# Patient Record
Sex: Female | Born: 1950 | Race: White | Hispanic: No | Marital: Married | Smoking: Former smoker
Health system: Southern US, Community
[De-identification: ages and names within clinical notes are randomized; demographics above are authoritative.]

## PROBLEM LIST (undated history)

## (undated) DIAGNOSIS — F419 Anxiety disorder, unspecified: Secondary | ICD-10-CM

## (undated) DIAGNOSIS — T7840XA Allergy, unspecified, initial encounter: Secondary | ICD-10-CM

## (undated) DIAGNOSIS — R32 Unspecified urinary incontinence: Secondary | ICD-10-CM

## (undated) DIAGNOSIS — F17200 Nicotine dependence, unspecified, uncomplicated: Secondary | ICD-10-CM

## (undated) DIAGNOSIS — I2510 Atherosclerotic heart disease of native coronary artery without angina pectoris: Secondary | ICD-10-CM

## (undated) DIAGNOSIS — I509 Heart failure, unspecified: Secondary | ICD-10-CM

## (undated) DIAGNOSIS — Q0700 Arnold-Chiari syndrome without spina bifida or hydrocephalus: Secondary | ICD-10-CM

## (undated) DIAGNOSIS — I10 Essential (primary) hypertension: Secondary | ICD-10-CM

## (undated) DIAGNOSIS — M1990 Unspecified osteoarthritis, unspecified site: Secondary | ICD-10-CM

## (undated) DIAGNOSIS — Z8674 Personal history of sudden cardiac arrest: Secondary | ICD-10-CM

## (undated) DIAGNOSIS — R0600 Dyspnea, unspecified: Secondary | ICD-10-CM

## (undated) DIAGNOSIS — R55 Syncope and collapse: Secondary | ICD-10-CM

## (undated) DIAGNOSIS — A048 Other specified bacterial intestinal infections: Secondary | ICD-10-CM

## (undated) DIAGNOSIS — Z95810 Presence of automatic (implantable) cardiac defibrillator: Secondary | ICD-10-CM

## (undated) DIAGNOSIS — K219 Gastro-esophageal reflux disease without esophagitis: Secondary | ICD-10-CM

## (undated) DIAGNOSIS — E785 Hyperlipidemia, unspecified: Secondary | ICD-10-CM

## (undated) HISTORY — PX: APPENDECTOMY: SHX54

## (undated) HISTORY — DX: Syncope and collapse: R55

## (undated) HISTORY — DX: Hyperlipidemia, unspecified: E78.5

## (undated) HISTORY — PX: TUBAL LIGATION: SHX77

## (undated) HISTORY — DX: Atherosclerotic heart disease of native coronary artery without angina pectoris: I25.10

## (undated) HISTORY — PX: EYE SURGERY: SHX253

## (undated) HISTORY — DX: Personal history of sudden cardiac arrest: Z86.74

## (undated) HISTORY — DX: Essential (primary) hypertension: I10

## (undated) HISTORY — PX: BLEPHAROPLASTY: SUR158

## (undated) HISTORY — PX: CARDIAC CATHETERIZATION: SHX172

## (undated) HISTORY — PX: CHOLECYSTECTOMY: SHX55

## (undated) HISTORY — DX: Arnold-Chiari syndrome without spina bifida or hydrocephalus: Q07.00

## (undated) HISTORY — DX: Anxiety disorder, unspecified: F41.9

## (undated) HISTORY — DX: Heart failure, unspecified: I50.9

## (undated) HISTORY — DX: Allergy, unspecified, initial encounter: T78.40XA

## (undated) HISTORY — DX: Presence of automatic (implantable) cardiac defibrillator: Z95.810

## (undated) HISTORY — PX: C-EYE SURGERY PROCEDURE: 102257504

## (undated) HISTORY — DX: Gastro-esophageal reflux disease without esophagitis: K21.9

## (undated) HISTORY — PX: CATARACT EXTRACTION: SUR2

## (undated) HISTORY — PX: OTHER SURGICAL HISTORY: SHX169

## (undated) HISTORY — DX: Nicotine dependence, unspecified, uncomplicated: F17.200

---

## 1998-08-15 ENCOUNTER — Other Ambulatory Visit: Admission: RE | Admit: 1998-08-15 | Discharge: 1998-08-15 | Payer: Self-pay | Admitting: Specialist

## 1999-01-28 ENCOUNTER — Ambulatory Visit (HOSPITAL_COMMUNITY): Admission: RE | Admit: 1999-01-28 | Discharge: 1999-01-28 | Payer: Self-pay | Admitting: Gastroenterology

## 1999-02-04 ENCOUNTER — Ambulatory Visit (HOSPITAL_COMMUNITY): Admission: RE | Admit: 1999-02-04 | Discharge: 1999-02-04 | Payer: Self-pay | Admitting: Gastroenterology

## 1999-02-04 ENCOUNTER — Encounter: Payer: Self-pay | Admitting: Gastroenterology

## 2000-02-12 ENCOUNTER — Other Ambulatory Visit: Admission: RE | Admit: 2000-02-12 | Discharge: 2000-02-12 | Payer: Self-pay | Admitting: Family Medicine

## 2001-01-19 ENCOUNTER — Other Ambulatory Visit: Admission: RE | Admit: 2001-01-19 | Discharge: 2001-01-19 | Payer: Self-pay | Admitting: *Deleted

## 2002-02-09 ENCOUNTER — Other Ambulatory Visit: Admission: RE | Admit: 2002-02-09 | Discharge: 2002-02-09 | Payer: Self-pay | Admitting: Internal Medicine

## 2002-04-19 ENCOUNTER — Encounter: Admission: RE | Admit: 2002-04-19 | Discharge: 2002-04-19 | Payer: Self-pay | Admitting: Family Medicine

## 2002-04-19 ENCOUNTER — Encounter: Payer: Self-pay | Admitting: Family Medicine

## 2004-01-02 ENCOUNTER — Encounter: Admission: RE | Admit: 2004-01-02 | Discharge: 2004-01-02 | Payer: Self-pay | Admitting: Family Medicine

## 2004-01-22 ENCOUNTER — Encounter (INDEPENDENT_AMBULATORY_CARE_PROVIDER_SITE_OTHER): Payer: Self-pay | Admitting: *Deleted

## 2004-01-22 ENCOUNTER — Observation Stay (HOSPITAL_COMMUNITY): Admission: RE | Admit: 2004-01-22 | Discharge: 2004-01-23 | Payer: Self-pay | Admitting: General Surgery

## 2004-02-20 ENCOUNTER — Encounter: Admission: RE | Admit: 2004-02-20 | Discharge: 2004-02-20 | Payer: Self-pay | Admitting: General Surgery

## 2004-04-16 ENCOUNTER — Ambulatory Visit: Payer: Self-pay | Admitting: Gastroenterology

## 2004-05-21 ENCOUNTER — Ambulatory Visit: Payer: Self-pay | Admitting: Gastroenterology

## 2004-08-01 ENCOUNTER — Ambulatory Visit: Payer: Self-pay | Admitting: Family Medicine

## 2004-08-28 ENCOUNTER — Ambulatory Visit: Payer: Self-pay | Admitting: Family Medicine

## 2004-09-09 ENCOUNTER — Encounter: Admission: RE | Admit: 2004-09-09 | Discharge: 2004-09-09 | Payer: Self-pay | Admitting: Family Medicine

## 2004-09-09 ENCOUNTER — Other Ambulatory Visit: Admission: RE | Admit: 2004-09-09 | Discharge: 2004-09-09 | Payer: Self-pay | Admitting: Family Medicine

## 2004-09-09 ENCOUNTER — Ambulatory Visit: Payer: Self-pay | Admitting: Family Medicine

## 2004-09-23 ENCOUNTER — Ambulatory Visit: Payer: Self-pay | Admitting: Family Medicine

## 2004-10-16 ENCOUNTER — Ambulatory Visit: Payer: Self-pay | Admitting: Family Medicine

## 2004-12-23 ENCOUNTER — Ambulatory Visit: Payer: Self-pay | Admitting: Family Medicine

## 2005-04-21 ENCOUNTER — Encounter
Admission: RE | Admit: 2005-04-21 | Discharge: 2005-04-21 | Payer: Self-pay | Admitting: Physical Medicine and Rehabilitation

## 2005-06-11 ENCOUNTER — Ambulatory Visit: Payer: Self-pay | Admitting: Family Medicine

## 2005-10-17 ENCOUNTER — Ambulatory Visit: Payer: Self-pay | Admitting: Internal Medicine

## 2006-02-17 ENCOUNTER — Ambulatory Visit: Payer: Self-pay | Admitting: Family Medicine

## 2006-03-11 ENCOUNTER — Ambulatory Visit: Payer: Self-pay | Admitting: Family Medicine

## 2006-08-11 ENCOUNTER — Ambulatory Visit: Payer: Self-pay | Admitting: Family Medicine

## 2006-12-08 ENCOUNTER — Ambulatory Visit: Payer: Self-pay | Admitting: Internal Medicine

## 2006-12-08 DIAGNOSIS — N3000 Acute cystitis without hematuria: Secondary | ICD-10-CM | POA: Insufficient documentation

## 2006-12-08 LAB — CONVERTED CEMR LAB
Bilirubin Urine: NEGATIVE
Glucose, Urine, Semiquant: NEGATIVE
Ketones, urine, test strip: NEGATIVE
Nitrite: NEGATIVE
Protein, U semiquant: NEGATIVE
Specific Gravity, Urine: 1.015
Urobilinogen, UA: 0.2
WBC Urine, dipstick: NEGATIVE
pH: 5

## 2007-02-25 ENCOUNTER — Encounter (INDEPENDENT_AMBULATORY_CARE_PROVIDER_SITE_OTHER): Payer: Self-pay | Admitting: Internal Medicine

## 2007-02-26 ENCOUNTER — Encounter (INDEPENDENT_AMBULATORY_CARE_PROVIDER_SITE_OTHER): Payer: Self-pay | Admitting: *Deleted

## 2007-03-01 ENCOUNTER — Ambulatory Visit: Payer: Self-pay | Admitting: Family Medicine

## 2007-03-01 LAB — CONVERTED CEMR LAB
Bilirubin Urine: NEGATIVE
Blood in Urine, dipstick: NEGATIVE
Glucose, Urine, Semiquant: NEGATIVE
Ketones, urine, test strip: NEGATIVE
Nitrite: NEGATIVE
Protein, U semiquant: NEGATIVE
Specific Gravity, Urine: 1.015
Urobilinogen, UA: NEGATIVE
WBC Urine, dipstick: NEGATIVE
pH: 6

## 2007-03-02 ENCOUNTER — Encounter: Payer: Self-pay | Admitting: Family Medicine

## 2007-05-03 ENCOUNTER — Ambulatory Visit: Payer: Self-pay | Admitting: Family Medicine

## 2007-05-03 DIAGNOSIS — R21 Rash and other nonspecific skin eruption: Secondary | ICD-10-CM | POA: Insufficient documentation

## 2007-05-03 DIAGNOSIS — J0101 Acute recurrent maxillary sinusitis: Secondary | ICD-10-CM | POA: Insufficient documentation

## 2007-05-03 DIAGNOSIS — R55 Syncope and collapse: Secondary | ICD-10-CM | POA: Insufficient documentation

## 2007-05-03 DIAGNOSIS — J019 Acute sinusitis, unspecified: Secondary | ICD-10-CM | POA: Insufficient documentation

## 2007-05-03 DIAGNOSIS — J0100 Acute maxillary sinusitis, unspecified: Secondary | ICD-10-CM | POA: Insufficient documentation

## 2007-10-12 ENCOUNTER — Ambulatory Visit: Payer: Self-pay | Admitting: Family Medicine

## 2007-10-12 DIAGNOSIS — J069 Acute upper respiratory infection, unspecified: Secondary | ICD-10-CM | POA: Insufficient documentation

## 2007-10-14 ENCOUNTER — Telehealth (INDEPENDENT_AMBULATORY_CARE_PROVIDER_SITE_OTHER): Payer: Self-pay | Admitting: Internal Medicine

## 2008-05-30 ENCOUNTER — Telehealth (INDEPENDENT_AMBULATORY_CARE_PROVIDER_SITE_OTHER): Payer: Self-pay | Admitting: Internal Medicine

## 2008-06-12 ENCOUNTER — Encounter (INDEPENDENT_AMBULATORY_CARE_PROVIDER_SITE_OTHER): Payer: Self-pay | Admitting: Internal Medicine

## 2008-06-20 ENCOUNTER — Ambulatory Visit (HOSPITAL_BASED_OUTPATIENT_CLINIC_OR_DEPARTMENT_OTHER): Admission: RE | Admit: 2008-06-20 | Discharge: 2008-06-20 | Payer: Self-pay | Admitting: Orthopedic Surgery

## 2008-08-02 ENCOUNTER — Encounter (INDEPENDENT_AMBULATORY_CARE_PROVIDER_SITE_OTHER): Payer: Self-pay | Admitting: Internal Medicine

## 2008-08-08 ENCOUNTER — Encounter (INDEPENDENT_AMBULATORY_CARE_PROVIDER_SITE_OTHER): Payer: Self-pay | Admitting: Internal Medicine

## 2008-08-08 ENCOUNTER — Encounter (INDEPENDENT_AMBULATORY_CARE_PROVIDER_SITE_OTHER): Payer: Self-pay | Admitting: *Deleted

## 2008-08-08 LAB — HM MAMMOGRAPHY: HM Mammogram: NORMAL

## 2008-12-19 ENCOUNTER — Ambulatory Visit: Payer: Self-pay | Admitting: Family Medicine

## 2008-12-19 DIAGNOSIS — Z87891 Personal history of nicotine dependence: Secondary | ICD-10-CM | POA: Insufficient documentation

## 2008-12-19 DIAGNOSIS — F17200 Nicotine dependence, unspecified, uncomplicated: Secondary | ICD-10-CM

## 2008-12-19 DIAGNOSIS — I10 Essential (primary) hypertension: Secondary | ICD-10-CM | POA: Insufficient documentation

## 2009-03-19 ENCOUNTER — Telehealth: Payer: Self-pay | Admitting: Family Medicine

## 2009-04-02 ENCOUNTER — Encounter: Admission: RE | Admit: 2009-04-02 | Discharge: 2009-04-02 | Payer: Self-pay | Admitting: Internal Medicine

## 2009-09-19 ENCOUNTER — Encounter (INDEPENDENT_AMBULATORY_CARE_PROVIDER_SITE_OTHER): Payer: Self-pay | Admitting: *Deleted

## 2010-07-16 NOTE — Miscellaneous (Signed)
Summary: MGM Results   Clinical Lists Changes  Observations: Added new observation of MAMMO DUE: 08/2009 (08/08/2008 15:31) Added new observation of MAMMOGRAM: normal (08/08/2008 15:31)      Preventive Care Screening  Mammogram:    Date:  08/08/2008    Next Due:  08/2009    Results:  normal

## 2010-07-16 NOTE — Assessment & Plan Note (Signed)
Summary: 2:00 UTI  Medications Added LOTREL 5-20 MG CAPS (AMLODIPINE BESY-BENAZEPRIL HCL) Take 1 capsule by mouth once a day PROTONIX 40 MG TBEC (PANTOPRAZOLE SODIUM) Take 1 tablet by mouth once a day CIPRO 250 MG  TABS (CIPROFLOXACIN HCL) 1 two times a day for 3 days for bladder infection. Continue for 1 week if symptoms not relieved in the firtst day or two of treatment      Allergies Added: * SULFA * DOXYCYCLINE * PREDNISONE * TETRACYCLINE * CODEINE * GUAIFENESIN * PEPCID  Vital Signs:  Patient Profile:   60 Years Old Female Weight:      163.38 pounds Temp:     98.5 degrees F oral Pulse rate:   72 / minute BP sitting:   132 / 74  (left arm)  Vitals Entered By: Lelon Mast (December 08, 2006 1:56 PM)               Chief Complaint:  / uti.  History of Present Illness: Got ?cold 2 weeks ago--caught from grandson Tried claritin and guafenisin Still not over it  Cold meds generally affect her urinary system Now with R back since 6/20 Chills, feels lousy Nocturia which is not typical for her + burning dysuria and urgency No fever but had some sweats 6/22 Slight nausea, no abd pain Able to eat  Current Allergies: * SULFA * DOXYCYCLINE * PREDNISONE * TETRACYCLINE * CODEINE * GUAIFENESIN * PEPCID      Physical Exam  General:     NAD Ears:     R ear normal and L ear normal.   Nose:     mild congestion Mouth:     no erythema and no exudates.   Lungs:     normal respiratory effort and normal breath sounds.   Abdomen:     mild suprapubic tenderness Msk:     mild R CVA tenderness    Impression & Recommendations:  Problem # 1:  CYSTITIS, ACUTE (ICD-595.0) Assessment: New Discussed that she should avoid cold meds No evidence of UTI though symptoms are classic for cystitis Ongoing URI that seems viral Her updated medication list for this problem includes:    Cipro 250 Mg Tabs (Ciprofloxacin hcl) .[name]... 1 two times a day for 3 days for bladder  infection. continue for 1 week if symptoms not relieved in the firtst day or two of treatment  Orders: UA Dipstick w/o Micro (81002)   Medications Added to Medication List This Visit: 1)  Lotrel 5-20 Mg Caps (Amlodipine besy-benazepril hcl) .... Take 1 capsule by mouth once a day 2)  Protonix 40 Mg Tbec (Pantoprazole sodium) .... Take 1 tablet by mouth once a day 3)  Cipro 250 Mg Tabs (Ciprofloxacin hcl) .[name].. 1 two times a day for 3 days for bladder infection. continue for 1 week if symptoms not relieved in the firtst day or two of treatment   Patient Instructions: 1)  Please schedule a follow-up appointment as needed.    Prescriptions: CIPRO 250 MG  TABS (CIPROFLOXACIN HCL) 1 two times a day for 3 days for bladder infection. Continue for 1 week if symptoms not relieved in the firtst day or two of treatment  #14 x 0   Entered and Authorized by:   Claris Gower MD   Signed by:   Claris Gower MD on 12/08/2006   Method used:   Print then Give to Patient   RxID:   [id number]    Vital Signs:  Patient Profile:  60 Years Old Female Weight:      163.38 pounds Temp:     98.5 degrees F oral Pulse rate:   72 / minute BP sitting:   132 / 74               Laboratory Results   Urine Tests  Date/Time Recieved: 6.24.2008  Routine Urinalysis   Color: yellow Appearance: Clear Glucose: negative   (Normal Range: Negative) Bilirubin: negative   (Normal Range: Negative) Ketone: negative   (Normal Range: Negative) Spec. Gravity: 1.015   (Normal Range: 1.003-1.035) Blood: small   (Normal Range: Negative) pH: 5.0   (Normal Range: 5.0-8.0) Protein: negative   (Normal Range: Negative) Urobilinogen: 0.2   (Normal Range: 0-1) Nitrite: negative   (Normal Range: Negative) Leukocyte Esterace: negative   (Normal Range: Negative)

## 2010-07-16 NOTE — Letter (Signed)
Summary: Results Follow up Letter  [place] [facility]  [address] [place], [place] [address]   Phone: [phone]  Fax: [phone]    02/26/2007 MRN: [id number]  [name] [address] [place], [place]  [address]  Dear Ms. Kartes,  The following are the results of your recent test(s):  Test         Result    Pap Smear:        Normal _____  Not Normal _____ Comments: ______________________________________________________ Cholesterol: LDL(Bad cholesterol):         Your goal is less than:         HDL (Good cholesterol):       Your goal is more than: Comments:  ______________________________________________________ Mammogram:        Normal __x___  Not Normal _____ Comments:  Repeat in one year please.  ___________________________________________________________________ Hemoccult:        Normal _____  Not normal _______ Comments:    _____________________________________________________________________ Other Tests:    We routinely do not discuss normal results over the telephone.  If you desire a copy of the results, or you have any questions about this information we can discuss them at your next office visit.   Sincerely,    BillieBean,FNP/[place]. [place],CMA

## 2010-07-16 NOTE — Assessment & Plan Note (Signed)
Summary: CONGESTION/CLE   Vital Signs:  Patient profile:   60 year old female Height:      64.5 inches Weight:      164.50 pounds BMI:     27.90 Temp:     98.3 degrees F oral Pulse rate:   68 / minute Pulse rhythm:   regular Resp:     20 per minute BP sitting:   132 / 80  (left arm) Cuff size:   regular  Vitals Entered By: Ozzie Hoyle LPN (July  6, 624THL 624THL AM)  CC:  congestion in chest and head, productive cough  thick but colorless mucus, and both ears ache and sorethroat on and off with drainage.[name]  History of Present Illness: Here for URI--cough and head congestion, ears hurt--onset x 3wks ago--fever and chills, no work for 4 days --has been taking guiafenesin and tylenol --got better, then got worseagain-- --continues to smoke 1 pk/d--is aware of need to stop  Clarinex not more help than claritin  Allergies: 1)  ! * Latex 2)  * Sulfa 3)  * Doxycycline 4)  * Prednisone 5)  * Tetracycline 6)  * Codeine 7)  * Guaifenesin 8)  * Pepcid  Past History:  Review of Systems      See HPI  Physical Exam  General:  alert, well-developed, well-nourished, and well-hydrated.   Eyes:  pupils equal, pupils round, and no injection.   Ears:  TMs retracted with fluid bilat Nose:  no airflow obstruction, mucosal erythema, and mucosal edema.  sinuses tender throughout Mouth:  no exudates and pharyngeal erythema.   Neck:  no JVD and no carotid bruits.   Lungs:  moist harsh cough, no crackles and no wheezes.   Heart:  normal rate, regular rhythm, and no murmur.   Extremities:  no pretib or ankle edema Neurologic:  alert & oriented X3 and gait normal.   Cervical Nodes:  shotty tonsilar nodes--tender, no anterior cervical adenopathy and no posterior cervical adenopathy.   Psych:  normally interactive and good eye contact.     Impression & Recommendations:  Problem # 1:  URI (ICD-465.9) Assessment New continue comfort care measures: increase po fluids, rest, tylenol or IBP as  needed   biaxin 2 once daily for 7d claritin as needed  Her updated medication list for this problem includes:    Clarinex 5 Mg Tabs (Desloratadine) .[name]... 1 once daily for congestion as needed    Tylenol 325 Mg Tabs (Acetaminophen) ..... Otc as directed  Problem # 2:  HYPERTENSION (ICD-401.9) Assessment: Unchanged will continue on current med see back in 6 mo or as needed  Her updated medication list for this problem includes:    Lotrel 5-20 Mg Caps (Amlodipine besy-benazepril hcl) .[name]... Take 1 capsule by mouth once a day  BP today: 132/80 Prior BP: 118/78 (10/12/2007)  Problem # 3:  TOBACCO ABUSE (ICD-305.1) Assessment: Unchanged strongly encouraged reduction in number/d with goal of D/C  Complete Medication List: 1)  Lotrel 5-20 Mg Caps (Amlodipine besy-benazepril hcl) .... Take 1 capsule by mouth once a day 2)  Protonix 40 Mg Tbec (Pantoprazole sodium) .... Take 1 tablet by mouth once a day as needed 3)  Xanax 0.25 Mg Tabs (Alprazolam) .... As needed 4)  Clarinex 5 Mg Tabs (Desloratadine) .[name].. 1 once daily for congestion as needed 5)  Tylenol 325 Mg Tabs (Acetaminophen) .... Otc as directed 6)  Biaxin 500 Mg Tabs (Clarithromycin) .... Take 2 daily Prescriptions: LOTREL 5-20 MG CAPS (AMLODIPINE BESY-BENAZEPRIL  HCL) Take 1 capsule by mouth once a day  #30 x 6   Entered and Authorized by:   Dixie Dials FNP   Signed by:   Dixie Dials FNP on 12/19/2008   Method used:   Print then Give to Patient   RxID:   [id number] BIAXIN 500 MG TABS (CLARITHROMYCIN) take 2 daily  #20 x 0   Entered and Authorized by:   Dixie Dials FNP   Signed by:   Dixie Dials FNP on 12/19/2008   Method used:   Print then Give to Patient   [id number]   Current Allergies (reviewed today): ! * LATEX * SULFA * DOXYCYCLINE * PREDNISONE * TETRACYCLINE * CODEINE * GUAIFENESIN * PEPCID

## 2010-07-16 NOTE — Progress Notes (Signed)
Summary: Rx-Alprazolam  Phone Note Refill Request Message from:  Fax from Pharmacy on May 30, 2008 8:26 AM  Refills Requested: Medication #1:  XANAX 0.25 MG  TABS as needed   Last Refilled: 11/16/2007 take 1/2 to 1 tablet daily as needed. #[address] [place] [id number]  Initial call taken by: Jasmine December,  May 30, 2008 8:27 AM  Follow-up for Phone Call        Rx completed  Billie-Lynn Fabian Sharp FNP  May 30, 2008 11:54 AM   Additional Follow-up for Phone Call Additional follow up Details #1::        Rx called to pharmacy as instructed. Additional Follow-up by: Emelia Salisbury,  May 30, 2008 12:33 PM      Prescriptions: XANAX 0.25 MG  TABS (ALPRAZOLAM) as needed  #30 x 5   Entered and Authorized by:   Dixie Dials FNP   Signed by:   Dixie Dials FNP on 05/30/2008   Method used:   Telephoned to ...       [facility]  [place] (retail)       [address]       [place], [place]  [address]       Ph: [phone] or [phone]       Fax: [phone]   RxID:   [id number]

## 2010-07-16 NOTE — Letter (Signed)
Summary: Previsit letter  [facility]  [place], [place] [address]   Phone: [phone]  Fax: [phone]       09/19/2009 MRN: [id number]  [name] [address] [place],   [address]  Dear [name],  Welcome to the Gastroenterology Division at [facility].    You are scheduled to see a nurse for your pre-procedure visit on 10-19-09 at 8:30a.m. on the 3rd floor at [facility] [place] [employer].  We ask that you try to arrive at our office 15 minutes prior to your appointment time to allow for check-in.  Your nurse visit will consist of discussing your medical and surgical history, your immediate family medical history, and your medications.    Please bring a complete list of all your medications or, if you prefer, bring the medication bottles and we will list them.  We will need to be aware of both prescribed and over the counter drugs.  We will need to know exact dosage information as well.  If you are on blood thinners (Coumadin, Plavix, Aggrenox, Ticlid, etc.) please call our office today/prior to your appointment, as we need to consult with your physician about holding your medication.   Please be prepared to read and sign documents such as consent forms, a financial agreement, and acknowledgement forms.  If necessary, and with your consent, a friend or relative is welcome to sit-in on the nurse visit with you.  Please bring your insurance card so that we may make a copy of it.  If your insurance requires a referral to see a specialist, please bring your referral form from your primary care physician.  No co-pay is required for this nurse visit.     If you cannot keep your appointment, please call [phone] to cancel or reschedule prior to your appointment date.  This allows [place] the opportunity to schedule an appointment for another patient in need of care.    Thank you for choosing [facility] for your medical  needs.  We appreciate the opportunity to care for you.  Please visit [place] at our website  to learn more about our practice.                     Sincerely.                                                                                                                   The Gastroenterology Division

## 2010-07-16 NOTE — Miscellaneous (Signed)
  Clinical Lists Changes  Observations: Added new observation of MAMMOGRAM: normal (02/08/2007 15:02)       Preventive Care Screening  Mammogram:    Date:  02/08/2007    Results:  normal

## 2010-07-16 NOTE — Assessment & Plan Note (Signed)
Summary: ?UTI/CLE   Vital Signs:  Patient Profile:   60 Years Old Female Weight:      163.25 pounds Temp:     98.4 degrees F oral Pulse rate:   72 / minute Pulse rhythm:   regular BP sitting:   142 / 90  (left arm) Cuff size:   regular  Vitals Entered ByMarland Kitchen Christena Deem (March 01, 2007 12:15 PM)                 Chief Complaint:  ? UTI.  Acute Visit History:      The patient complains of genitourinary symptoms.  She denies fever.  She notes a history of dysuria and frequency.  She denies fever, flank pain, or hematuria.  Comments: hx of frequent UTI symptoms went away after Cipro x 3 tabs in 6/08, UA was neg then. Did not take full course. Had some leftover Cipro took 3 tabs 1 week ago, symptoms improved and then recurred. low back ache and central anterior abdominal pressure```````````````````````````````.         Current Allergies (reviewed today): * SULFA * DOXYCYCLINE * PREDNISONE * TETRACYCLINE * CODEINE * GUAIFENESIN * PEPCID   Family History:    Reviewed history and no changes required:     Physical Exam  General:     Well-developed,well-nourished,in no acute distress; alert,appropriate and cooperative throughout examination Abdomen:     mild suprapubic tenderness, ttp left adenexa, no mass    Impression & Recommendations:  Problem # 1:  CYSTITIS, ACUTE (ICD-595.0) Again negative UA but classic UTI symptoms.  ? partitally treated.  Will  send culture to eval for bac/sensitivities.  In meantime will treat with Cipro full uncomplicated UTI course.    Her updated medication list for this problem includes:    Cipro 250 Mg Tabs (Ciprofloxacin hcl) .[name]... Take 1 tablet by mouth every 12 hours x 3 days  Orders: T-Culture, Urine [id number]) UA Dipstick w/o Micro [id number])  The following medications were removed from the medication list:    Cipro 250 Mg Tabs (Ciprofloxacin hcl) .[name]... 1 two times a day for 3 days for bladder infection. continue for 1  week if symptoms not relieved in the firtst day or two of treatment  Her updated medication list for this problem includes:    Cipro 250 Mg Tabs (Ciprofloxacin hcl) .[name]... Take 1 tablet by mouth every 12 hours x 3 days   Complete Medication List: 1)  Lotrel 5-20 Mg Caps (Amlodipine besy-benazepril hcl) .... Take 1 capsule by mouth once a day 2)  Protonix 40 Mg Tbec (Pantoprazole sodium) .... Take 1 tablet by mouth once a day as needed 3)  Xanax 0.25 Mg Tabs (Alprazolam) .... As needed 4)  Estrace 0.1 Mg/gm Crea (Estradiol) .... As needed 5)  Tylenol #8  .... Once daily as needed 6)  Cipro 250 Mg Tabs (Ciprofloxacin hcl) .... Take 1 tablet by mouth every 12 hours x 3 days     Prescriptions: CIPRO 250 MG  TABS (CIPROFLOXACIN HCL) Take 1 tablet by mouth every 12 hours x 3 days  #6 x 0   Entered and Authorized by:   [name] MD   Signed by:   [name] MD on 03/01/2007   Method used:   Print then Give to Patient   RxID:   [id number]  ] Laboratory Results   Urine Tests  Date/Time Recieved: March 01, 2007 12:31 PM  Date/Time Reported: March 01, 2007 12:31 PM   Routine Urinalysis  Color: yellow Appearance: Clear Glucose: negative   (Normal Range: Negative) Bilirubin: negative   (Normal Range: Negative) Ketone: negative   (Normal Range: Negative) Spec. Gravity: 1.015   (Normal Range: 1.003-1.035) Blood: negative   (Normal Range: Negative) pH: 6.0   (Normal Range: 5.0-8.0) Protein: negative   (Normal Range: Negative) Urobilinogen: negative   (Normal Range: 0-1) Nitrite: negative   (Normal Range: Negative) Leukocyte Esterace: negative   (Normal Range: Negative)        Current Allergies (reviewed today): * SULFA * DOXYCYCLINE * PREDNISONE * TETRACYCLINE * CODEINE * GUAIFENESIN * PEPCID

## 2010-10-29 NOTE — Op Note (Signed)
NAME:  [name], [name]                ACCOUNT NO.:  [id number]   MEDICAL RECORD NO.:  [id number]          PATIENT TYPE:  AMB   LOCATION:  [place]                          FACILITY:  [facility]   PHYSICIAN:  Youlanda Mighty. Sypher, M.D. 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   OPERATION:  1. Excision of mucoid cyst.  2. Distal interphalangeal arthrotomy right long finger with removal of      marginal osteophytes, loose body, and synovectomy.   OPERATING SURGEON:  Youlanda Mighty. Sypher, MD   ASSISTANT:  Marily Lente Dasnoit, PA-C   ANESTHESIA:  Lidocaine 2% metacarpal head, right long finger block.  This was performed as a minor operating room setting procedure.   ANESTHETIST:  Youlanda Mighty. Sypher, MD   INDICATIONS:  [name] is a 60 year old woman who was seen for  evaluation and management of a draining mucoid cyst.  She was referred  by Wyatt Mage of [employer], [place].   X-rays of her finger demonstrated advanced osteoarthritis with bone-on-  bone arthropathy and large marginal osteophytes with a probable loose  body.   After informed consent, she was brought to the operating room at this  time anticipating cyst debridement and PIP joint debridement.   PROCEDURE:  [name] is brought to the operating room and placed in  supine position upon the table.  Following informed consent, a 2%  lidocaine metacarpal head level block was placed at the base of the  right long finger.   After 10 minutes, excellent anesthesia was  achieved.   The right arm was prepped with Betadine soap solution and sterilely  draped.  Latex free protocol was followed due to her stated latex  allergy.   The finger was exsanguinated with a latex free Esmarch that was left as  a digital tourniquet.  The cyst was excised with a circumferential  dissection down through the dermis to the peritenon.  This was passed  off as an en bloc resection.   The ulnar aspect of the long finger DIP joint was then entered with  removal of the capsule between the terminal extensor tendon slip and the  ulnar collateral ligament.  A small curette was used to remove the loose  bodies followed by use of a microrongeur to perform a synovectomy.  The  marginal osteophytes at the base of the distal phalanx and the middle  phalanx were removed with a rongeur and careful use of a microcurette.   Thereafter the wound was repaired with simple interrupted suture of 5-0  nylon.  There were no apparent complications.  Ms. Steadman was placed in  a compressive dressing with Xeroflo sterile gauze and latex free Coban.  She was provided prescription for Darvocet-N 100 one p.o. q.4-6 h.  p.r.n. pain, 20 tablets without refill as a postoperative analgesic.  We  will see her back for followup in 1 week for suture removal.   She expressed an interest in returning to work in 66 hours.  We advised  her to remove her dressing and change Band-Aids with Neosporin on the  wound on a once a day or twice a day basis depending on swelling.   She will contact our office p.r.n. wound problems.      Youlanda Mighty Sypher, M.D.  Electronically Signed     RVS/MEDQ  D:  06/20/2008  T:  06/21/2008  Job:  [id number]

## 2010-11-01 NOTE — Op Note (Signed)
NAME:  [name], [name]                          ACCOUNT NO.:  [id number]   MEDICAL RECORD NO.:  [id number]                   PATIENT TYPE:  AMB   LOCATION:  DAY                                  FACILITY[facility]  [facility]   PHYSICIAN:  Timothy E. Rosana Hoes, M.D.              DATE OF BIRTH:  [birth year]   DATE OF PROCEDURE:  01/22/2004  DATE OF DISCHARGE:                                 OPERATIVE REPORT   PREOPERATIVE DIAGNOSIS:  Chronic cholecystolithiasis.   POSTOPERATIVE DIAGNOSIS:  Chronic cholecystolithiasis.   PROCEDURE:  Laparoscopic cholecystectomy and operative cholangiogram.   SURGEON:  Lennie Hummer, M.D.   ASSISTANT:  RN   This otherwise healthy 60 year old Caucasian female presented in the office  for chronic cholecystolithiasis, symptomatic, and she wishes to proceed with  a laparoscopic cholecystectomy and has been carefully discussed.  Note today  is made of her LATEX allergy.   She is taken to the operating room, placed supine, general endotracheal  anesthesia administered.  The abdomen was prepped and draped in the usual  fashion.  Marcaine 0.25% with epinephrine is used prior to each incision.  An infraumbilical incision was made, the fascia identified, opened in the  midline, peritoneum entered without complication, Hasson catheter placed and  tied in place with a figure-of-eight suture 0 Vicryl.  The abdomen  insufflated.  General peritoneoscopy was unremarkable.  A second 10 mm  trocar placed in the mid epigastrium, two 5 mm trocars in the right upper  quadrant.  The gallbladder was indolent.  It was grasped, placed on tension.  Careful dissection at the base of the gallbladder with completely dissection  of a window showed the isolated cystic duct and an isolated cystic artery.  The cystic duct was clipped near the gallbladder.  Small incision made in  the cystic duct and with the percutaneously passed cholangiogram catheter  and then using real-time fluoroscopy,  cholangiography showed filling of the  biliary tree with smooth and rapid emptying into the duodenum.  The clip was  removed, the catheter removed, the stump of the cystic duct triply clipped,  the duct fully divided, the artery isolated, triply clipped, and divided,  and the gallbladder removed from the gallbladder bed without incident or  complication.  The bed was dry, and irrigant was clear.  Gallbladder was  removed through the infraumbilical incision which was then tied under direct  vision.  Further irrigation was clear.  All irrigant, CO2 instruments, and  trocars removed under direct vision.  Counts were correct.  Each incision  was carefully checked, and the wounds were closed with 3-0 Monocryl  subcuticular sutures.  Steri-Strips and dry sterile dressing applied.  Final  counts correct.  She tolerated it well, was awakened, and taken to the  recovery room in good condition.  Timothy E. Rosana Hoes, M.D.    TED/MEDQ  D:  01/22/2004  T:  01/22/2004  Job:  [id number]   cc:   Marne A. Glori Bickers, M.D. [facility]

## 2011-01-03 ENCOUNTER — Encounter: Payer: Self-pay | Admitting: Family Medicine

## 2011-01-03 ENCOUNTER — Ambulatory Visit (INDEPENDENT_AMBULATORY_CARE_PROVIDER_SITE_OTHER): Payer: PRIVATE HEALTH INSURANCE | Admitting: Family Medicine

## 2011-01-03 VITALS — BP 148/80 | HR 80 | Temp 98.6°F | Wt 162.0 lb

## 2011-01-03 DIAGNOSIS — N3000 Acute cystitis without hematuria: Secondary | ICD-10-CM

## 2011-01-03 DIAGNOSIS — R300 Dysuria: Secondary | ICD-10-CM

## 2011-01-03 DIAGNOSIS — R350 Frequency of micturition: Secondary | ICD-10-CM

## 2011-01-03 LAB — POCT URINALYSIS DIPSTICK
Bilirubin, UA: NEGATIVE
Blood, UA: NEGATIVE
Glucose, UA: NEGATIVE
Ketones, UA: NEGATIVE
Leukocytes, UA: NEGATIVE
Nitrite, UA: NEGATIVE
Protein, UA: NEGATIVE
Spec Grav, UA: 1.005
Urobilinogen, UA: 0.2
pH, UA: 6.5

## 2011-01-03 MED ORDER — CIPROFLOXACIN HCL 250 MG PO TABS: 250.0000 mg | ORAL_TABLET | Freq: Two times a day (BID) | ORAL | Status: DC

## 2011-01-03 NOTE — Assessment & Plan Note (Signed)
UA normal. However given typical UTI sxs pt endorses, will treat with 3d course, sent culture. Advised if not improving as expected to return to be seen.

## 2011-01-03 NOTE — Patient Instructions (Signed)
Urine looking ok today, however will send culture and treat with 3 d course of cipro 250mg  twice daily given typical symptoms. We will call you with urine results. In meantime, rest, fluids and tylenol for discomfort.

## 2011-01-03 NOTE — Progress Notes (Signed)
  Subjective:    Patient ID: [name], female    DOB: 11/19/1950, 60 y.o.   MRN: [id number]  HPI CC: UTI?  1 wk h/o bladder sxs.  At first thought had hurt back (lower back pain).  + dysuria and polyuria, urgency.  Some flank pain.  Some abd soreness.  No n/v/d, fevers/chills.  No blood in urine.  Has been drinking plenty of fluid.  Taken tylenol for discomfort.  States recently went swimming - used to get UTIs when went swimming years ago.  States urine normally looks fine, but has had uti in past.  Review of Systems Per HPI    Objective:   Physical Exam  Nursing note and vitals reviewed. Constitutional: She appears well-developed and well-nourished. No distress.  HENT:  Head: Normocephalic and atraumatic.  Mouth/Throat: Oropharynx is clear and moist. No oropharyngeal exudate.  Eyes: Conjunctivae and EOM are normal. Pupils are equal, round, and reactive to light. No scleral icterus.  Cardiovascular: Normal rate, regular rhythm, normal heart sounds and intact distal pulses.   No murmur heard. Pulmonary/Chest: Effort normal and breath sounds normal. No respiratory distress. She has no wheezes. She has no rales.  Abdominal: Soft. Bowel sounds are normal. She exhibits no distension. There is no tenderness. There is no rebound and no guarding.       No CVA tenderness  Skin: Skin is warm and dry. No rash noted.  Psychiatric: She has a normal mood and affect.          Assessment & Plan:

## 2011-01-05 LAB — URINE CULTURE
Colony Count: NO GROWTH
Organism ID, Bacteria: NO GROWTH

## 2011-01-08 ENCOUNTER — Ambulatory Visit (INDEPENDENT_AMBULATORY_CARE_PROVIDER_SITE_OTHER): Payer: PRIVATE HEALTH INSURANCE | Admitting: Family Medicine

## 2011-01-08 ENCOUNTER — Other Ambulatory Visit (HOSPITAL_COMMUNITY)
Admission: RE | Admit: 2011-01-08 | Discharge: 2011-01-08 | Disposition: A | Discharge status: Home / Self Care | Payer: PRIVATE HEALTH INSURANCE | Source: Ambulatory Visit | Attending: Family Medicine | Admitting: Family Medicine

## 2011-01-08 ENCOUNTER — Encounter: Payer: Self-pay | Admitting: Family Medicine

## 2011-01-08 VITALS — BP 130/80 | HR 67 | Temp 98.7°F | Wt 160.5 lb

## 2011-01-08 DIAGNOSIS — Z01419 Encounter for gynecological examination (general) (routine) without abnormal findings: Secondary | ICD-10-CM | POA: Insufficient documentation

## 2011-01-08 DIAGNOSIS — F320 Major depressive disorder, single episode, mild: Secondary | ICD-10-CM | POA: Insufficient documentation

## 2011-01-08 DIAGNOSIS — Z1159 Encounter for screening for other viral diseases: Secondary | ICD-10-CM | POA: Insufficient documentation

## 2011-01-08 DIAGNOSIS — Z113 Encounter for screening for infections with a predominantly sexual mode of transmission: Secondary | ICD-10-CM

## 2011-01-08 DIAGNOSIS — F4320 Adjustment disorder, unspecified: Secondary | ICD-10-CM

## 2011-01-08 DIAGNOSIS — R3129 Other microscopic hematuria: Secondary | ICD-10-CM

## 2011-01-08 LAB — POCT URINALYSIS DIPSTICK
Bilirubin, UA: NEGATIVE
Glucose, UA: NEGATIVE
Ketones, UA: NEGATIVE
Leukocytes, UA: NEGATIVE
Nitrite, UA: NEGATIVE
Spec Grav, UA: 1.005
Urobilinogen, UA: NEGATIVE
pH, UA: 6

## 2011-01-08 LAB — HIV ANTIBODY (ROUTINE TESTING W REFLEX): HIV: NONREACTIVE

## 2011-01-08 NOTE — Patient Instructions (Signed)
Hang in there. Please stop by to see [name] on your way out.

## 2011-01-08 NOTE — Progress Notes (Signed)
  Subjective:    Patient ID: [name], female    DOB: Aug 31, 1950, 60 y.o.   MRN: [id number]  HPI 60 yo here for ? STD. Saw Dr. Darnell Level 5 days ago with presumed UTI symptoms.  Given 3 day course of cipro but urine culture showed no growth and symptoms resolved.  Says she is truly here to discuss personal issues:  Her husband is manic and has been for several months, not willing to take his antipsychotic medication or see a psychiatrist. She is concerned he has given her an STD. During one of his manic episodes in the 90s, he gave her chlamydia. Denies any vagina discharge, dysuria or suprapubic pressure. No genital sores.  Wants her pap smear done today as well.  Very anxious about this situation, seeing Delia Chimes but wants to see a psychiatrist. Cannot tolerate SSRIs.  Takes as needed Xanax but lately she is tearful and anxious all the time. No SI or HI.   Review of Systems Per HPI    Objective:   Physical Exam  BP 130/80  Pulse 67  Temp(Src) 98.7 F (37.1 C) (Oral)  Wt 160 lb 8 oz (72.802 kg)  General:  Well-developed,well-nourished,in no acute distress; alert,appropriate and cooperative throughout examination Head:  normocephalic and atraumatic.   Eyes:  vision grossly intact, pupils equal, pupils round, and pupils reactive to light.   Ears:  R ear normal and L ear normal.   Nose:  no external deformity.   Mouth:  good dentition.   Abdomen:  Bowel sounds positive,abdomen soft and non-tender without masses, organomegaly or hernias noted. Rectal:  no external abnormalities.   Genitalia:  Pelvic Exam:        External: normal female genitalia without lesions or masses        Vagina: normal without lesions or masses        Cervix: normal without lesions or masses        Adnexa: normal bimanual exam without masses or fullness        Uterus: normal by palpation        Pap smear: performed Msk:  No deformity or scoliosis noted of thoracic or lumbar spine.   Extremities:   No clubbing, cyanosis, edema, or deformity noted with normal full range of motion of all joints.   Neurologic:  alert & oriented X3 and gait normal.   Skin:  Intact without suspicious lesions or rashes Cervical Nodes:  No lymphadenopathy noted Axillary Nodes:  No palpable lymphadenopathy Psych:  Anxious, tearful and difficulty focusing on one topic         Assessment & Plan:   1. Screening for STD (sexually transmitted disease)  Orders Placed This Encounter  Procedures  . HIV Antibody ( Reflex)  . RPR  . GC/chlamydia probe amp, genital     3. Adjustment disorder   Will refer to psychiatry as I do think she would benefit from psychotherapy and further evaluation/medication management given her intolerance or SSRIs. Marland Kitchenagreed   4. Hematuria, microscopic   New.  Send urine for culture. Repeat UA in 2 weeks, if hematuria persists, refer to urology.

## 2011-01-09 LAB — RPR

## 2011-01-09 LAB — GC/CHLAMYDIA PROBE AMP, GENITAL
Chlamydia, DNA Probe: NEGATIVE
GC Probe Amp, Genital: NEGATIVE

## 2011-01-10 LAB — URINE CULTURE
Colony Count: NO GROWTH
Organism ID, Bacteria: NO GROWTH

## 2011-01-12 ENCOUNTER — Emergency Department (HOSPITAL_COMMUNITY)
Admission: EM | Admit: 2011-01-12 | Discharge: 2011-01-12 | Disposition: A | Discharge status: Home / Self Care | Payer: PRIVATE HEALTH INSURANCE | Attending: Emergency Medicine | Admitting: Emergency Medicine

## 2011-01-12 ENCOUNTER — Inpatient Hospital Stay (INDEPENDENT_AMBULATORY_CARE_PROVIDER_SITE_OTHER)
Admission: RE | Admit: 2011-01-12 | Discharge: 2011-01-12 | Disposition: A | Discharge status: Home / Self Care | Payer: PRIVATE HEALTH INSURANCE | Source: Ambulatory Visit | Attending: Family Medicine | Admitting: Family Medicine

## 2011-01-12 DIAGNOSIS — R10814 Left lower quadrant abdominal tenderness: Secondary | ICD-10-CM

## 2011-01-12 DIAGNOSIS — N390 Urinary tract infection, site not specified: Secondary | ICD-10-CM | POA: Insufficient documentation

## 2011-01-12 DIAGNOSIS — M545 Low back pain, unspecified: Secondary | ICD-10-CM | POA: Insufficient documentation

## 2011-01-12 DIAGNOSIS — I10 Essential (primary) hypertension: Secondary | ICD-10-CM | POA: Insufficient documentation

## 2011-01-12 LAB — POCT URINALYSIS DIP (DEVICE)
Bilirubin Urine: NEGATIVE
Glucose, UA: NEGATIVE mg/dL
Ketones, ur: NEGATIVE mg/dL
Leukocytes, UA: NEGATIVE
Nitrite: NEGATIVE
Protein, ur: NEGATIVE mg/dL
Specific Gravity, Urine: <=1.005 (ref 1.005–1.030)
Urobilinogen, UA: 0.2 mg/dL (ref 0.0–1.0)
pH: 5.5 (ref 5.0–8.0)

## 2011-01-12 LAB — URINALYSIS, ROUTINE W REFLEX MICROSCOPIC
Bilirubin Urine: NEGATIVE
Glucose, UA: NEGATIVE mg/dL
Hgb urine dipstick: NEGATIVE
Ketones, ur: NEGATIVE mg/dL
Leukocytes, UA: NEGATIVE
Nitrite: NEGATIVE
Protein, ur: NEGATIVE mg/dL
Specific Gravity, Urine: 1.012 (ref 1.005–1.030)
Urobilinogen, UA: 0.2 mg/dL (ref 0.0–1.0)
pH: 6.5 (ref 5.0–8.0)

## 2011-01-13 ENCOUNTER — Encounter: Payer: Self-pay | Admitting: *Deleted

## 2011-07-01 ENCOUNTER — Ambulatory Visit (INDEPENDENT_AMBULATORY_CARE_PROVIDER_SITE_OTHER): Payer: PRIVATE HEALTH INSURANCE | Admitting: *Deleted

## 2011-07-01 DIAGNOSIS — Z23 Encounter for immunization: Secondary | ICD-10-CM

## 2011-11-05 ENCOUNTER — Other Ambulatory Visit: Payer: Self-pay | Admitting: Family Medicine

## 2011-11-05 DIAGNOSIS — Z0000 Encounter for general adult medical examination without abnormal findings: Secondary | ICD-10-CM

## 2011-11-05 DIAGNOSIS — Z136 Encounter for screening for cardiovascular disorders: Secondary | ICD-10-CM

## 2011-11-05 DIAGNOSIS — I10 Essential (primary) hypertension: Secondary | ICD-10-CM

## 2011-11-11 ENCOUNTER — Other Ambulatory Visit: Payer: PRIVATE HEALTH INSURANCE

## 2011-11-12 ENCOUNTER — Other Ambulatory Visit (INDEPENDENT_AMBULATORY_CARE_PROVIDER_SITE_OTHER): Payer: PRIVATE HEALTH INSURANCE

## 2011-11-12 DIAGNOSIS — I10 Essential (primary) hypertension: Secondary | ICD-10-CM

## 2011-11-12 DIAGNOSIS — Z0000 Encounter for general adult medical examination without abnormal findings: Secondary | ICD-10-CM

## 2011-11-12 DIAGNOSIS — Z136 Encounter for screening for cardiovascular disorders: Secondary | ICD-10-CM

## 2011-11-12 LAB — CBC WITH DIFFERENTIAL/PLATELET
Basophils Absolute: 0 10*3/uL (ref 0.0–0.1)
Basophils Relative: 0.8 % (ref 0.0–3.0)
Eosinophils Absolute: 0.1 10*3/uL (ref 0.0–0.7)
Eosinophils Relative: 2 % (ref 0.0–5.0)
HCT: 43.1 % (ref 36.0–46.0)
Hemoglobin: 14.3 g/dL (ref 12.0–15.0)
Lymphocytes Relative: 27.1 % (ref 12.0–46.0)
Lymphs Abs: 1.7 10*3/uL (ref 0.7–4.0)
MCHC: 33.2 g/dL (ref 30.0–36.0)
MCV: 90.6 fl (ref 78.0–100.0)
Monocytes Absolute: 0.2 10*3/uL (ref 0.1–1.0)
Monocytes Relative: 3.8 % (ref 3.0–12.0)
Neutro Abs: 4.2 10*3/uL (ref 1.4–7.7)
Neutrophils Relative %: 66.3 % (ref 43.0–77.0)
Platelets: 234 10*3/uL (ref 150.0–400.0)
RBC: 4.75 Mil/uL (ref 3.87–5.11)
RDW: 13.7 % (ref 11.5–14.6)
WBC: 6.3 10*3/uL (ref 4.5–10.5)

## 2011-11-12 LAB — COMPREHENSIVE METABOLIC PANEL
ALT: 9 U/L (ref 0–35)
AST: 16 U/L (ref 0–37)
Albumin: 4.1 g/dL (ref 3.5–5.2)
Alkaline Phosphatase: 69 U/L (ref 39–117)
BUN: 7 mg/dL (ref 6–23)
CO2: 29 mEq/L (ref 19–32)
Calcium: 9 mg/dL (ref 8.4–10.5)
Chloride: 105 mEq/L (ref 96–112)
Creatinine, Ser: 0.9 mg/dL (ref 0.4–1.2)
GFR: 66.72 mL/min (ref >60.00)
Glucose, Bld: 93 mg/dL (ref 70–99)
Potassium: 3.5 mEq/L (ref 3.5–5.1)
Sodium: 141 mEq/L (ref 135–145)
Total Bilirubin: 0.5 mg/dL (ref 0.3–1.2)
Total Protein: 6.9 g/dL (ref 6.0–8.3)

## 2011-11-12 LAB — LIPID PANEL
Cholesterol: 238 mg/dL — ABNORMAL HIGH (ref 0–200)
HDL: 39.7 mg/dL (ref >39.00)
Total CHOL/HDL Ratio: 6
Triglycerides: 125 mg/dL (ref 0.0–149.0)
VLDL: 25 mg/dL (ref 0.0–40.0)

## 2011-11-12 LAB — LDL CHOLESTEROL, DIRECT: Direct LDL: 191.5 mg/dL

## 2011-11-19 ENCOUNTER — Ambulatory Visit (INDEPENDENT_AMBULATORY_CARE_PROVIDER_SITE_OTHER): Payer: PRIVATE HEALTH INSURANCE | Admitting: Family Medicine

## 2011-11-19 ENCOUNTER — Encounter: Payer: Self-pay | Admitting: Family Medicine

## 2011-11-19 VITALS — BP 150/70 | HR 84 | Temp 98.0°F | Wt 146.0 lb

## 2011-11-19 DIAGNOSIS — Z0000 Encounter for general adult medical examination without abnormal findings: Secondary | ICD-10-CM

## 2011-11-19 DIAGNOSIS — Z1211 Encounter for screening for malignant neoplasm of colon: Secondary | ICD-10-CM

## 2011-11-19 DIAGNOSIS — I10 Essential (primary) hypertension: Secondary | ICD-10-CM

## 2011-11-19 DIAGNOSIS — N951 Menopausal and female climacteric states: Secondary | ICD-10-CM | POA: Insufficient documentation

## 2011-11-19 DIAGNOSIS — E785 Hyperlipidemia, unspecified: Secondary | ICD-10-CM | POA: Insufficient documentation

## 2011-11-19 MED ORDER — CONJ ESTROG-MEDROXYPROGEST ACE 0.3-1.5 MG PO TABS: 1.0000 | ORAL_TABLET | Freq: Every day | ORAL | Status: DC

## 2011-11-19 MED ORDER — AMLODIPINE BESY-BENAZEPRIL HCL 5-20 MG PO CAPS: 1.0000 | ORAL_CAPSULE | Freq: Every day | ORAL | Status: DC

## 2011-11-19 MED ORDER — ALPRAZOLAM 0.25 MG PO TABS: 0.2500 mg | ORAL_TABLET | Freq: Three times a day (TID) | ORAL | Status: DC | PRN

## 2011-11-19 NOTE — Patient Instructions (Addendum)
Please come back in 2 weeks to get your blood pressure rechecked. Please stop by the lab to get your stool cards.  Check with your insurance to see if they will cover the shingles shot.

## 2011-11-19 NOTE — Progress Notes (Signed)
Subjective:    Patient ID: [name], female    DOB: 03/31/1951, 60 y.o.   MRN: [id number]  HPI 61 yo here for CPX.  Had mammogram in November, pap smear last year. Has never had a colonoscopy and continues to refuse one--she will however agree to stool cards.  Had a rough year- husband had a psychotic break, now on appropriate mediations.    Very anxious about this situation, seeing Delia Chimes and another psychologist at [facility].  Also saw Dr. Jacques Earthly (psychiatry) at [facility]. Cannot tolerate SSRIs.  Takes as needed Xanax and feels it has been working ok. No SI or HI.  Vaginal dryness- would like to try low dose HRT again.  Having some mild hot flashes.  Post menopausal for 10 years now.    HLD- has been on multiple statins, cannot tolerate any of them due to myalgias, including pravastatin per pt. Allergic to fish oil and cannot tolerate niacin. Has been working on diet, lost a significant amount of weight since last year. Wt Readings from Last 3 Encounters:  11/19/11 146 lb (66.225 kg)  01/08/11 160 lb 8 oz (72.802 kg)  01/03/11 162 lb (73.483 kg)   HTN- had been stable on Lotrel, ran out yesterday. No HA, CP or SOB.  It's very expensive and would like to try generic equivalent.  Lab Results  Component Value Date   CHOL 238* 11/12/2011   HDL 39.70 11/12/2011   LDLDIRECT 191.5 11/12/2011   TRIG 125.0 11/12/2011   CHOLHDL 6 11/12/2011     Patient Active Problem List  Diagnoses  . TOBACCO ABUSE  . HYPERTENSION  . CYSTITIS, ACUTE  . SYNCOPE  . RASH-NONVESICULAR  . Screening for STD (sexually transmitted disease)  . Adjustment disorder  . Hematuria, microscopic  . Routine general medical examination at a health care facility  . HLD (hyperlipidemia)   Past Medical History  Diagnosis Date  . Unspecified essential hypertension   . Syncope and collapse   . Tobacco use disorder    No past surgical history on file. History  Substance Use Topics  . Smoking  status: Current Everyday Smoker -- 1.0 packs/day    Types: Cigarettes  . Smokeless tobacco: Not on file  . Alcohol Use: No   No family history on file. Allergies  Allergen Reactions  . Codeine     REACTION: unspecified  . Doxycycline     REACTION: unspecified  . Famotidine     REACTION: rash  . Guaifenesin     REACTION: palpitations  . Latex     REACTION: causes rash  . Prednisone     REACTION: anxiety  . Sulfonamide Derivatives     REACTION: unspecified  . Tetracycline     REACTION: nausea   Current Outpatient Prescriptions on File Prior to Visit  Medication Sig Dispense Refill  . acetaminophen (TYLENOL) 325 MG tablet Take 650 mg by mouth every 6 (six) hours as needed.        . ALPRAZolam (XANAX) 0.25 MG tablet Take 0.25 mg by mouth as needed.        [name] amLODipine-benazepril (LOTREL) 5-20 MG per capsule Take 1 capsule by mouth daily.        [name] loratadine (CLARITIN) 10 MG tablet Take 10 mg by mouth daily as needed.          Review of Systems Per HPI Patient reports no  vision/ hearing changes,anorexia, weight change, fever ,adenopathy, persistant / recurrent hoarseness, swallowing issues, chest pain, edema,persistant /  recurrent cough, hemoptysis, dyspnea(rest, exertional, paroxysmal nocturnal), gastrointestinal  bleeding (melena, rectal bleeding), abdominal pain, excessive heart burn, GU symptoms(dysuria, hematuria, pyuria, voiding/incontinence  Issues) syncope, focal weakness, severe memory loss, concerning skin lesions, depression, anxiety, abnormal bruising/bleeding, major joint swelling, breast masses or abnormal vaginal bleeding.       Objective:   Physical Exam  BP 150/70  Pulse 84  Temp(Src) 98 F (36.7 C) (Oral)  Wt 146 lb (66.225 kg) Wt Readings from Last 3 Encounters:  11/19/11 146 lb (66.225 kg)  01/08/11 160 lb 8 oz (72.802 kg)  01/03/11 162 lb (73.483 kg)    General:  Well-developed,well-nourished,in no acute distress; alert,appropriate and cooperative  throughout examination Head:  normocephalic and atraumatic.   Eyes:  vision grossly intact, pupils equal, pupils round, and pupils reactive to light.   Ears:  R ear normal and L ear normal.   Nose:  no external deformity.   Mouth:  good dentition.   Neck:  No deformities, masses, or tenderness noted. Breasts:  No mass, nodules, thickening, tenderness, bulging, retraction, inflamation, nipple discharge or skin changes noted.   Lungs:  Normal respiratory effort, chest expands symmetrically. Lungs are clear to auscultation, no crackles or wheezes. Heart:  Normal rate and regular rhythm. S1 and S2 normal without gallop, murmur, click, rub or other extra sounds. Abdomen:  Bowel sounds positive,abdomen soft and non-tender without masses, organomegaly or hernias noted. Msk:  No deformity or scoliosis noted of thoracic or lumbar spine.   Extremities:  No clubbing, cyanosis, edema, or deformity noted with normal full range of motion of all joints.   Neurologic:  alert & oriented X3 and gait normal.   Skin:  Intact without suspicious lesions or rashes Cervical Nodes:  No lymphadenopathy noted Axillary Nodes:  No palpable lymphadenopathy Psych:  Cognition and judgment appear intact. Alert and cooperative with normal attention span and concentration. No apparent delusions, illusions, hallucinations Psych:  Less anxious today, still has difficulty focusing on one topic     Assessment & Plan:    1. Routine general medical examination at a health care facility  Reviewed preventive care protocols, scheduled due services, and updated immunizations Discussed nutrition, exercise, diet, and healthy lifestyle. She will call her insurance company about coverage of Zostvax.  Fecal occult blood, imunochemical  2. HYPERTENSION  Deteriorated- ran out of medication. Will send in generic equivalent of her lotrel.  Pt to return in two weeks for follow up.   3. HLD (hyperlipidemia)  Deteriorated.  She is aware of  risks of HLD.  She is refusing treatment and will continue to work on diet.   4. Vaginal dryness, menopausal  Deteriorated.  Discussed risks and benefits of HRT.  Pt has a uterus and needs progesterone with estrogen to prevent endometrial hyperplasia (20-50% of patients on unopposed estrogen for 1 yr will develop hyperplasia). rx for prempro sent to her pharmacy. The patient indicates understanding of these issues and agrees with the plan.

## 2011-12-04 ENCOUNTER — Ambulatory Visit: Payer: PRIVATE HEALTH INSURANCE | Admitting: Family Medicine

## 2011-12-08 ENCOUNTER — Encounter: Payer: Self-pay | Admitting: Family Medicine

## 2011-12-08 ENCOUNTER — Ambulatory Visit (INDEPENDENT_AMBULATORY_CARE_PROVIDER_SITE_OTHER): Payer: PRIVATE HEALTH INSURANCE | Admitting: Family Medicine

## 2011-12-08 VITALS — BP 160/80 | HR 76 | Temp 98.3°F | Wt 148.0 lb

## 2011-12-08 DIAGNOSIS — I10 Essential (primary) hypertension: Secondary | ICD-10-CM

## 2011-12-08 DIAGNOSIS — F4320 Adjustment disorder, unspecified: Secondary | ICD-10-CM

## 2011-12-08 MED ORDER — BUSPIRONE HCL 7.5 MG PO TABS: 7.5000 mg | ORAL_TABLET | Freq: Three times a day (TID) | ORAL | Status: DC

## 2011-12-08 NOTE — Patient Instructions (Addendum)
It was good to see you. Hang in there. We are starting 7.5 mg- three times day. Please come back to see me in two weeks.

## 2011-12-08 NOTE — Progress Notes (Signed)
Subjective:    Patient ID: [name], female    DOB: [birth year], 61 y.o.   MRN: [id number]  HPI 61 yo here for follow up HTN.  HTN- had been stable on Lotrel, ran out prior to last office visit earlier this month. No HA, CP or SOB.  It's very expensive and wanted to try generic equivalent.  Here for 2 week follow up of BP.  BP is elevated but under extreme stress. Husband had another manic episode two weeks ago. Seeing a therapist which is helping a little.  Has tried multiple SSRIs in past for anxiety, did not like how they made her feel.  Constantly anxious and tearful since husband's manic episode. No SI or HI. Sleeping ok. Appetite fair.   Patient Active Problem List  Diagnosis  . TOBACCO ABUSE  . HYPERTENSION  . CYSTITIS, ACUTE  . SYNCOPE  . RASH-NONVESICULAR  . Screening for STD (sexually transmitted disease)  . Adjustment disorder  . Hematuria, microscopic  . Routine general medical examination at a health care facility  . HLD (hyperlipidemia)  . Vaginal dryness, menopausal   Past Medical History  Diagnosis Date  . Unspecified essential hypertension   . Syncope and collapse   . Tobacco use disorder    No past surgical history on file. History  Substance Use Topics  . Smoking status: Current Everyday Smoker -- 1.0 packs/day    Types: Cigarettes  . Smokeless tobacco: Not on file  . Alcohol Use: No   No family history on file. Allergies  Allergen Reactions  . Codeine     REACTION: unspecified  . Doxycycline     REACTION: unspecified  . Famotidine     REACTION: rash  . Guaifenesin     REACTION: palpitations  . Latex     REACTION: causes rash  . Prednisone     REACTION: anxiety  . Shellfish-Derived Products     anaphylaxis  . Sulfonamide Derivatives     REACTION: unspecified  . Tetracycline     REACTION: nausea   Current Outpatient Prescriptions on File Prior to Visit  Medication Sig Dispense Refill  . acetaminophen (TYLENOL) 325 MG  tablet Take 650 mg by mouth every 6 (six) hours as needed.        . ALPRAZolam (XANAX) 0.25 MG tablet Take 1 tablet (0.25 mg total) by mouth 3 (three) times daily as needed.  90 tablet  0  . amLODipine-benazepril (LOTREL) 5-20 MG per capsule Take 1 capsule by mouth daily.  30 capsule  11  . estrogen, conjugated,-medroxyprogesterone (PREMPRO) 0.3-1.5 MG per tablet Take 1 tablet by mouth daily.  30 tablet  6  . loratadine (CLARITIN) 10 MG tablet Take 10 mg by mouth daily as needed.          Review of Systems Per HPI No CP No SOB No LE edema No blurred vision  No HA    Objective:   Physical Exam   BP 160/80  Pulse 76  Temp 98.3 F (36.8 C)  Wt 148 lb (67.132 kg) BP Readings from Last 3 Encounters:  12/08/11 160/80  11/19/11 150/70  01/08/11 130/80   General:  Well-developed,well-nourished,in no acute distress; alert,appropriate and cooperative throughout examination Head:  normocephalic and atraumatic.   Eyes:  vision grossly intact, pupils equal, pupils round, and pupils reactive to light.   Ears:  R ear normal and L ear normal.   Nose:  no external deformity.   Mouth:  good dentition.   Neck:  No deformities, masses, or tenderness noted. Lungs:  Normal respiratory effort, chest expands symmetrically. Lungs are clear to auscultation, no crackles or wheezes. Heart:  Normal rate and regular rhythm. S1 and S2 normal without gallop, murmur, click, rub or other extra sounds. Msk:  No deformity or scoliosis noted of thoracic or lumbar spine.   Extremities:  No clubbing, cyanosis, edema, or deformity noted with normal full range of motion of all joints.   Neurologic:  alert & oriented X3 and gait normal.   Skin:  Intact without suspicious lesions or rashes Psych:  Cognition and judgment appear intact. Tearful, anxious    Assessment & Plan:   1. HYPERTENSION  Deteriorated- unclear if it's due to increased anxiety/stressors or changing to generic. >25 min spent with face to face  with patient, >50% counseling and/or coordinating care. Will continue current dose of Lotrel, follow up in 2 weeks. See below.    2. Adjustment disorder   Deteriorated. Continue psychotherapy. Will start buspar 7.5 mg three times daily. The patient indicates understanding of these issues and agrees with the plan.

## 2011-12-09 ENCOUNTER — Telehealth: Payer: Self-pay

## 2011-12-09 MED ORDER — AMLODIPINE BESY-BENAZEPRIL HCL 5-20 MG PO CAPS: 1.0000 | ORAL_CAPSULE | Freq: Every day | ORAL | Status: DC

## 2011-12-09 NOTE — Telephone Encounter (Signed)
Ok to send brand lotrel, 1 refill. Ok to wait until 7/8 to recheck BP.

## 2011-12-09 NOTE — Telephone Encounter (Signed)
Medicine sent to [facility], advised patient.

## 2011-12-09 NOTE — Telephone Encounter (Signed)
Pt cannot take Buspar; caused nausea, clammy sweats and dizziness. Pt request name brand Lotrel 5-20 to be sent to [facility]. Pt has appt 12/22/11 to see Dr Deborra Medina; should pt ck BP prior to appt?Please advise.

## 2011-12-22 ENCOUNTER — Ambulatory Visit (INDEPENDENT_AMBULATORY_CARE_PROVIDER_SITE_OTHER): Payer: PRIVATE HEALTH INSURANCE | Admitting: Family Medicine

## 2011-12-22 ENCOUNTER — Encounter: Payer: Self-pay | Admitting: Family Medicine

## 2011-12-22 VITALS — BP 142/82 | HR 76 | Temp 98.3°F | Wt 146.0 lb

## 2011-12-22 DIAGNOSIS — I10 Essential (primary) hypertension: Secondary | ICD-10-CM

## 2011-12-22 DIAGNOSIS — Z23 Encounter for immunization: Secondary | ICD-10-CM

## 2011-12-22 DIAGNOSIS — F4320 Adjustment disorder, unspecified: Secondary | ICD-10-CM

## 2011-12-22 DIAGNOSIS — Z2911 Encounter for prophylactic immunotherapy for respiratory syncytial virus (RSV): Secondary | ICD-10-CM

## 2011-12-22 MED ORDER — ALPRAZOLAM ER 0.5 MG PO TB24: 0.5000 mg | ORAL_TABLET | Freq: Every evening | ORAL | Status: AC | PRN

## 2011-12-22 NOTE — Addendum Note (Signed)
Addended by: Ricki Miller on: 12/22/2011 11:40 AM   Modules accepted: Orders

## 2011-12-22 NOTE — Patient Instructions (Addendum)
Good to see you. Hang in there. Please call me to let me know how you're doing over the next several months.

## 2011-12-22 NOTE — Progress Notes (Signed)
Subjective:    Patient ID: [name], female    DOB: 1951/03/19, 61 y.o.   MRN: [id number]  HPI 61 yo here for follow up HTN.  HTN- had been stable on Lotrel, ran out prior to office visit last month. It's very expensive and wanted to try generic equivalent. BP deteriorated, restarted trade name lotrel- here for BP follow up.     She has been under extreme stress. Husband had another manic episode last month. Seeing a therapist which is helping a little.  Has tried multiple SSRIs in past for anxiety, did not like how they made her feel. Tried Buspar last month, did not like how it made her feel.  Feels better overall, using her xanax. Continuing with psychotherapy.  Wants Zostavax today.    Patient Active Problem List  Diagnosis  . TOBACCO ABUSE  . HYPERTENSION  . CYSTITIS, ACUTE  . SYNCOPE  . RASH-NONVESICULAR  . Screening for STD (sexually transmitted disease)  . Adjustment disorder  . Hematuria, microscopic  . Routine general medical examination at a health care facility  . HLD (hyperlipidemia)  . Vaginal dryness, menopausal   Past Medical History  Diagnosis Date  . Unspecified essential hypertension   . Syncope and collapse   . Tobacco use disorder    No past surgical history on file. History  Substance Use Topics  . Smoking status: Current Everyday Smoker -- 1.0 packs/day    Types: Cigarettes  . Smokeless tobacco: Not on file  . Alcohol Use: No   No family history on file. Allergies  Allergen Reactions  . Buspar (Buspirone) Nausea Only    Sweating and dizzy  . Codeine     REACTION: unspecified  . Doxycycline     REACTION: unspecified  . Famotidine     REACTION: rash  . Guaifenesin     REACTION: palpitations  . Latex     REACTION: causes rash  . Prednisone     REACTION: anxiety  . Shellfish-Derived Products     anaphylaxis  . Sulfonamide Derivatives     REACTION: unspecified  . Tetracycline     REACTION: nausea   Current  Outpatient Prescriptions on File Prior to Visit  Medication Sig Dispense Refill  . acetaminophen (TYLENOL) 325 MG tablet Take 650 mg by mouth every 6 (six) hours as needed.        . ALPRAZolam (XANAX) 0.25 MG tablet Take 1 tablet (0.25 mg total) by mouth 3 (three) times daily as needed.  90 tablet  0  . amLODipine-benazepril (LOTREL) 5-20 MG per capsule Take 1 capsule by mouth daily.  30 capsule  0  . loratadine (CLARITIN) 10 MG tablet Take 10 mg by mouth daily as needed.        [name] estrogen, conjugated,-medroxyprogesterone (PREMPRO) 0.3-1.5 MG per tablet Take 1 tablet by mouth daily.  30 tablet  6    Review of Systems Per HPI No CP No SOB No LE edema No blurred vision  No HA    Objective:   Physical Exam   BP 142/82  Pulse 76  Temp 98.3 F (36.8 C)  Wt 146 lb (66.225 kg)  General:  Well-developed,well-nourished,in no acute distress; alert,appropriate and cooperative throughout examination Head:  normocephalic and atraumatic.   Eyes:  vision grossly intact, pupils equal, pupils round, and pupils reactive to light.   Ears:  R ear normal and L ear normal.   Nose:  no external deformity.   Mouth:  good dentition.  Neck:  No deformities, masses, or tenderness noted. Lungs:  Normal respiratory effort, chest expands symmetrically. Lungs are clear to auscultation, no crackles or wheezes. Heart:  Normal rate and regular rhythm. S1 and S2 normal without gallop, murmur, click, rub or other extra sounds. Msk:  No deformity or scoliosis noted of thoracic or lumbar spine.   Extremities:  No clubbing, cyanosis, edema, or deformity noted with normal full range of motion of all joints.   Neurologic:  alert & oriented X3 and gait normal.   Skin:  Intact without suspicious lesions or rashes Psych:  Cognition and judgment appear intact.  Assessment & Plan:   1. HYPERTENSION  Improved on trade name Lotrel.    2. Adjustment disorder   Continue psychotherapy. Xanax as needed.  Could not  tolerate multiple SSRIs/anxiolytics.

## 2011-12-23 ENCOUNTER — Other Ambulatory Visit: Payer: Self-pay | Admitting: *Deleted

## 2011-12-23 MED ORDER — ALPRAZOLAM 0.25 MG PO TABS: 0.2500 mg | ORAL_TABLET | Freq: Three times a day (TID) | ORAL | Status: DC | PRN

## 2011-12-23 NOTE — Telephone Encounter (Signed)
Faxed refill request from [facility], her extended release xanax was refilled yesterday.  This script was last filled 11/19/11.

## 2011-12-23 NOTE — Telephone Encounter (Signed)
Medicine called to pharmacy. 

## 2012-01-13 ENCOUNTER — Encounter: Payer: Self-pay | Admitting: Family Medicine

## 2012-01-13 ENCOUNTER — Encounter: Payer: Self-pay | Admitting: *Deleted

## 2012-01-13 ENCOUNTER — Other Ambulatory Visit: Payer: PRIVATE HEALTH INSURANCE

## 2012-01-13 DIAGNOSIS — Z1211 Encounter for screening for malignant neoplasm of colon: Secondary | ICD-10-CM

## 2012-01-13 LAB — FECAL OCCULT BLOOD, GUAIAC: Fecal Occult Blood: NEGATIVE

## 2012-01-13 LAB — FECAL OCCULT BLOOD, IMMUNOCHEMICAL: Fecal Occult Bld: NEGATIVE

## 2012-02-02 ENCOUNTER — Other Ambulatory Visit: Payer: Self-pay | Admitting: *Deleted

## 2012-02-02 MED ORDER — ALPRAZOLAM ER 0.5 MG PO TB24: 0.5000 mg | ORAL_TABLET | Freq: Two times a day (BID) | ORAL | Status: DC

## 2012-02-02 MED ORDER — ALPRAZOLAM 0.25 MG PO TABS: 0.2500 mg | ORAL_TABLET | Freq: Three times a day (TID) | ORAL | Status: DC | PRN

## 2012-02-02 NOTE — Telephone Encounter (Signed)
Pt is also requesting refill on alprazolam XR 0.5 mg's.  Last filled 30 on 12/24/11

## 2012-02-02 NOTE — Telephone Encounter (Signed)
Faxed refill request from [facility], last filled 12/23/11.

## 2012-02-02 NOTE — Telephone Encounter (Signed)
Can patient also have the .5 mg XR?

## 2012-02-02 NOTE — Telephone Encounter (Signed)
Yes

## 2012-02-02 NOTE — Telephone Encounter (Signed)
Medicines called to [facility].

## 2012-02-02 NOTE — Addendum Note (Signed)
Addended by: Ricki Miller on: 02/02/2012 02:20 PM   Modules accepted: Orders

## 2012-03-25 ENCOUNTER — Other Ambulatory Visit: Payer: Self-pay | Admitting: *Deleted

## 2012-03-25 MED ORDER — ALPRAZOLAM 0.25 MG PO TABS: 0.2500 mg | ORAL_TABLET | Freq: Three times a day (TID) | ORAL | Status: DC | PRN

## 2012-03-25 MED ORDER — ALPRAZOLAM ER 0.5 MG PO TB24: 0.5000 mg | ORAL_TABLET | Freq: Two times a day (BID) | ORAL | Status: DC

## 2012-03-25 NOTE — Telephone Encounter (Signed)
Ok to refill 

## 2012-03-25 NOTE — Telephone Encounter (Signed)
Medicines called to pharmacy.

## 2012-04-07 ENCOUNTER — Ambulatory Visit (INDEPENDENT_AMBULATORY_CARE_PROVIDER_SITE_OTHER): Payer: PRIVATE HEALTH INSURANCE | Admitting: Family Medicine

## 2012-04-07 ENCOUNTER — Encounter: Payer: Self-pay | Admitting: Family Medicine

## 2012-04-07 VITALS — BP 120/62 | HR 78 | Temp 98.0°F | Wt 150.5 lb

## 2012-04-07 DIAGNOSIS — N390 Urinary tract infection, site not specified: Secondary | ICD-10-CM

## 2012-04-07 LAB — POCT URINALYSIS DIPSTICK
Bilirubin, UA: NEGATIVE
Blood, UA: NEGATIVE
Glucose, UA: NEGATIVE
Ketones, UA: NEGATIVE
Leukocytes, UA: NEGATIVE
Nitrite, UA: NEGATIVE
Protein, UA: NEGATIVE
Spec Grav, UA: 1.01
Urobilinogen, UA: 0.2
pH, UA: 6

## 2012-04-07 MED ORDER — CIPROFLOXACIN HCL 250 MG PO TABS: 250.0000 mg | ORAL_TABLET | Freq: Two times a day (BID) | ORAL | Status: AC

## 2012-04-07 NOTE — Patient Instructions (Addendum)
Please take cipro as directed- 1 tablet twice daily for 3 days. We will call you in 2 days with your results.

## 2012-04-07 NOTE — Progress Notes (Signed)
SUBJECTIVE: [name] is a 61 y.o. female who complains of urinary frequency, urgency and dysuria x 5 days, without flank pain, fever, chills, or abnormal vaginal discharge or bleeding.   PMH significant for sulfa allergy.  Patient Active Problem List  Diagnosis  . TOBACCO ABUSE  . HYPERTENSION  . CYSTITIS, ACUTE  . SYNCOPE  . RASH-NONVESICULAR  . Screening for STD (sexually transmitted disease)  . Adjustment disorder  . Hematuria, microscopic  . Routine general medical examination at a health care facility  . HLD (hyperlipidemia)  . Vaginal dryness, menopausal   Past Medical History  Diagnosis Date  . Unspecified essential hypertension   . Syncope and collapse   . Tobacco use disorder    No past surgical history on file. History  Substance Use Topics  . Smoking status: Current Every Day Smoker -- 1.0 packs/day    Types: Cigarettes  . Smokeless tobacco: Not on file  . Alcohol Use: No   No family history on file. Allergies  Allergen Reactions  . Buspar (Buspirone) Nausea Only    Sweating and dizzy  . Codeine     REACTION: unspecified  . Doxycycline     REACTION: unspecified  . Famotidine     REACTION: rash  . Guaifenesin     REACTION: palpitations  . Latex     REACTION: causes rash  . Prednisone     REACTION: anxiety  . Shellfish-Derived Products     anaphylaxis  . Sulfonamide Derivatives     REACTION: unspecified  . Tetracycline     REACTION: nausea   Current Outpatient Prescriptions on File Prior to Visit  Medication Sig Dispense Refill  . acetaminophen (TYLENOL) 325 MG tablet Take 650 mg by mouth every 6 (six) hours as needed.        . ALPRAZolam (XANAX XR) 0.5 MG 24 hr tablet Take 1 tablet (0.5 mg total) by mouth 2 (two) times daily.  60 tablet  0  . ALPRAZolam (XANAX) 0.25 MG tablet Take 1 tablet (0.25 mg total) by mouth 3 (three) times daily as needed.  90 tablet  0  . amLODipine-benazepril (LOTREL) 5-20 MG per capsule Take 1 capsule by mouth  daily.  30 capsule  0  . loratadine (CLARITIN) 10 MG tablet Take 10 mg by mouth daily as needed.        [name] estrogen, conjugated,-medroxyprogesterone (PREMPRO) 0.3-1.5 MG per tablet Take 1 tablet by mouth daily.  30 tablet  6   The PMH, PSH, Social History, Family History, Medications, and allergies have been reviewed in [facility], and have been updated if relevant.  OBJECTIVE:  BP 120/62  Pulse 78  Temp 98 F (36.7 C) (Oral)  Wt 150 lb 8 oz (68.266 kg)  SpO2 96%  Appears well, in no apparent distress.  Vital signs are normal. The abdomen is soft without tenderness, guarding, mass, rebound or organomegaly. No CVA tenderness or inguinal adenopathy noted. Urine dipstick shows negative for all components  ASSESSMENT: UA neg but symptoms classic for cystitis.  Will treat for UTI uncomplicated without evidence of pyelonephritis  PLAN: Treatment per orders - cipro 250 mg twice daily x 3 days, send urine for cx.   Also push fluids, may use Pyridium OTC prn. Call or return to clinic prn if these symptoms worsen or fail to improve as anticipated.

## 2012-04-22 ENCOUNTER — Encounter: Payer: Self-pay | Admitting: Family Medicine

## 2012-04-30 ENCOUNTER — Ambulatory Visit: Payer: PRIVATE HEALTH INSURANCE | Admitting: Family Medicine

## 2012-05-03 ENCOUNTER — Ambulatory Visit (INDEPENDENT_AMBULATORY_CARE_PROVIDER_SITE_OTHER): Payer: PRIVATE HEALTH INSURANCE | Admitting: Family Medicine

## 2012-05-03 ENCOUNTER — Encounter: Payer: Self-pay | Admitting: Family Medicine

## 2012-05-03 VITALS — BP 130/78 | HR 80 | Temp 98.3°F | Wt 151.0 lb

## 2012-05-03 DIAGNOSIS — J069 Acute upper respiratory infection, unspecified: Secondary | ICD-10-CM

## 2012-05-03 MED ORDER — AZITHROMYCIN 250 MG PO TABS: ORAL_TABLET | ORAL | Status: DC

## 2012-05-03 NOTE — Patient Instructions (Addendum)
Good to see you. Hang in there. Take Zpack as directed.  Drink plenty of fluids.

## 2012-05-03 NOTE — Progress Notes (Signed)
SUBJECTIVE:  [name] is a 61 y.o. female who complains of coryza, congestion, night sweats, dry cough and headache for 21 days. She denies a history of anorexia, chest pain, shortness of breath, weakness and weight loss and denies a history of asthma. Patient admits to smoke cigarettes.   Patient Active Problem List  Diagnosis  . TOBACCO ABUSE  . HYPERTENSION  . CYSTITIS, ACUTE  . SYNCOPE  . RASH-NONVESICULAR  . Screening for STD (sexually transmitted disease)  . Adjustment disorder  . Hematuria, microscopic  . Routine general medical examination at a health care facility  . HLD (hyperlipidemia)  . Vaginal dryness, menopausal   Past Medical History  Diagnosis Date  . Unspecified essential hypertension   . Syncope and collapse   . Tobacco use disorder    No past surgical history on file. History  Substance Use Topics  . Smoking status: Current Every Day Smoker -- 1.0 packs/day    Types: Cigarettes  . Smokeless tobacco: Not on file  . Alcohol Use: No   No family history on file. Allergies  Allergen Reactions  . Buspar (Buspirone) Nausea Only    Sweating and dizzy  . Codeine     REACTION: unspecified  . Doxycycline     REACTION: unspecified  . Famotidine     REACTION: rash  . Guaifenesin     REACTION: palpitations  . Latex     REACTION: causes rash  . Prednisone     REACTION: anxiety  . Shellfish-Derived Products     anaphylaxis  . Sulfonamide Derivatives     REACTION: unspecified  . Tetracycline     REACTION: nausea   Current Outpatient Prescriptions on File Prior to Visit  Medication Sig Dispense Refill  . acetaminophen (TYLENOL) 325 MG tablet Take 650 mg by mouth every 6 (six) hours as needed.        . ALPRAZolam (XANAX XR) 0.5 MG 24 hr tablet Take 1 tablet (0.5 mg total) by mouth 2 (two) times daily.  60 tablet  0  . ALPRAZolam (XANAX) 0.25 MG tablet Take 1 tablet (0.25 mg total) by mouth 3 (three) times daily as needed.  90 tablet  0  .  amLODipine-benazepril (LOTREL) 5-20 MG per capsule Take 1 capsule by mouth daily.  30 capsule  0  . estrogen, conjugated,-medroxyprogesterone (PREMPRO) 0.3-1.5 MG per tablet Take 1 tablet by mouth daily.  30 tablet  6  . loratadine (CLARITIN) 10 MG tablet Take 10 mg by mouth daily as needed.         The PMH, PSH, Social History, Family History, Medications, and allergies have been reviewed in [facility], and have been updated if relevant.  OBJECTIVE: BP 130/78  Pulse 80  Temp 98.3 F (36.8 C)  Wt 151 lb (68.493 kg)  She appears well, vital signs are as noted. Ears normal.  Throat and pharynx normal.  Neck supple. No adenopathy in the neck. Nose is congested. Sinuses non tender. The chest is clear, without wheezes or rales.  ASSESSMENT:  bronchitis  PLAN: Given duration and progression of symptoms in a smoker, will treat for bacterial process.  Multiple drug allergies- treat with Zpack.  Symptomatic therapy suggested: push fluids, rest and return office visit prn if symptoms persist or worsen. Call or return to clinic prn if these symptoms worsen or fail to improve as anticipated.

## 2012-05-31 ENCOUNTER — Other Ambulatory Visit: Payer: Self-pay | Admitting: *Deleted

## 2012-05-31 MED ORDER — ALPRAZOLAM ER 0.5 MG PO TB24: 0.5000 mg | ORAL_TABLET | Freq: Two times a day (BID) | ORAL | Status: DC

## 2012-05-31 NOTE — Telephone Encounter (Signed)
Medication called to pharmacy. 

## 2012-08-03 ENCOUNTER — Other Ambulatory Visit: Payer: Self-pay | Admitting: *Deleted

## 2012-08-03 MED ORDER — ALPRAZOLAM 0.25 MG PO TABS: 0.2500 mg | ORAL_TABLET | Freq: Three times a day (TID) | ORAL | Status: DC | PRN

## 2012-08-03 MED ORDER — ALPRAZOLAM ER 0.5 MG PO TB24: 0.5000 mg | ORAL_TABLET | Freq: Two times a day (BID) | ORAL | Status: DC

## 2012-08-05 NOTE — Telephone Encounter (Signed)
Medicines called to [facility].

## 2012-10-12 ENCOUNTER — Other Ambulatory Visit: Payer: Self-pay | Admitting: *Deleted

## 2012-10-12 MED ORDER — ALPRAZOLAM 0.25 MG PO TABS: 0.2500 mg | ORAL_TABLET | Freq: Three times a day (TID) | ORAL | Status: DC | PRN

## 2012-10-12 MED ORDER — ALPRAZOLAM ER 0.5 MG PO TB24: 0.5000 mg | ORAL_TABLET | Freq: Two times a day (BID) | ORAL | Status: DC

## 2012-10-12 NOTE — Telephone Encounter (Signed)
Medicines called to [facility].

## 2012-10-12 NOTE — Telephone Encounter (Signed)
Last filled 08/05/12

## 2012-11-15 ENCOUNTER — Ambulatory Visit (INDEPENDENT_AMBULATORY_CARE_PROVIDER_SITE_OTHER): Payer: BC Managed Care – PPO | Admitting: Family Medicine

## 2012-11-15 ENCOUNTER — Other Ambulatory Visit: Payer: Self-pay | Admitting: Family Medicine

## 2012-11-15 ENCOUNTER — Encounter: Payer: Self-pay | Admitting: Radiology

## 2012-11-15 ENCOUNTER — Encounter: Payer: Self-pay | Admitting: Family Medicine

## 2012-11-15 VITALS — BP 130/80 | HR 84 | Temp 97.8°F | Wt 151.0 lb

## 2012-11-15 DIAGNOSIS — I10 Essential (primary) hypertension: Secondary | ICD-10-CM

## 2012-11-15 MED ORDER — ALPRAZOLAM 0.25 MG PO TABS: 0.2500 mg | ORAL_TABLET | Freq: Three times a day (TID) | ORAL | Status: DC | PRN

## 2012-11-15 MED ORDER — AMLODIPINE BESY-BENAZEPRIL HCL 10-20 MG PO CAPS: 1.0000 | ORAL_CAPSULE | Freq: Every day | ORAL | Status: DC

## 2012-11-15 MED ORDER — ALPRAZOLAM ER 0.5 MG PO TB24: 0.5000 mg | ORAL_TABLET | Freq: Two times a day (BID) | ORAL | Status: DC

## 2012-11-15 NOTE — Progress Notes (Signed)
  Subjective:    Patient ID: [name], female    DOB: [birth year], 62 y.o.   MRN: [id number]  HPI 62 yo here for follow up HTN.  HTN- had been stable on Lotrel but insurance no longer wants to cover it.  She would like to try generic equivalent.  Has no complaints today.  Patient Active Problem List   Diagnosis Date Noted  . HLD (hyperlipidemia) 11/19/2011  . Vaginal dryness, menopausal 11/19/2011  . Routine general medical examination at a health care facility 11/05/2011  . Screening for STD (sexually transmitted disease) 01/08/2011  . Adjustment disorder 01/08/2011  . TOBACCO ABUSE 12/19/2008  . HYPERTENSION 12/19/2008  . SYNCOPE 05/03/2007  . RASH-NONVESICULAR 05/03/2007   Past Medical History  Diagnosis Date  . Unspecified essential hypertension   . Syncope and collapse   . Tobacco use disorder    No past surgical history on file. History  Substance Use Topics  . Smoking status: Current Every Day Smoker -- 1.00 packs/day    Types: Cigarettes  . Smokeless tobacco: Not on file  . Alcohol Use: No   No family history on file. Allergies  Allergen Reactions  . Buspar (Buspirone) Nausea Only    Sweating and dizzy  . Codeine     REACTION: unspecified  . Doxycycline     REACTION: unspecified  . Famotidine     REACTION: rash  . Guaifenesin     REACTION: palpitations  . Latex     REACTION: causes rash  . Prednisone     REACTION: anxiety  . Shellfish-Derived Products     anaphylaxis  . Sulfonamide Derivatives     REACTION: unspecified  . Tetracycline     REACTION: nausea   Current Outpatient Prescriptions on File Prior to Visit  Medication Sig Dispense Refill  . acetaminophen (TYLENOL) 325 MG tablet Take 650 mg by mouth every 6 (six) hours as needed.        . ALPRAZolam (XANAX XR) 0.5 MG 24 hr tablet Take 1 tablet (0.5 mg total) by mouth 2 (two) times daily.  60 tablet  0  . ALPRAZolam (XANAX) 0.25 MG tablet Take 1 tablet (0.25 mg total) by mouth 3 (three)  times daily as needed.  90 tablet  0  . amLODipine-benazepril (LOTREL) 5-20 MG per capsule Take 1 capsule by mouth daily.  30 capsule  0  . loratadine (CLARITIN) 10 MG tablet Take 10 mg by mouth daily as needed.         No current facility-administered medications on file prior to visit.    Review of Systems Per HPI No CP No SOB No LE edema No blurred vision  No HA    Objective:   Physical Exam   BP 130/80  Pulse 84  Temp(Src) 97.8 F (36.6 C)  Wt 151 lb (68.493 kg)  BMI 25.53 kg/m2 BP Readings from Last 3 Encounters:  11/15/12 130/80  05/03/12 130/78  04/07/12 120/62   General:  Well-developed,well-nourished,in no acute distress; alert,appropriate and cooperative throughout examination Head:  normocephalic and atraumatic.   Psych:  Cognition and judgment appear intact.  Assessment & Plan:   1.  HTN- Stable.  Switch to generic. She will call me with BP readings. The patient indicates understanding of these issues and agrees with the plan.

## 2012-11-15 NOTE — Patient Instructions (Addendum)
Good to see you. Please let me know if you have any issues with your blood pressure medication.

## 2012-11-15 NOTE — Telephone Encounter (Signed)
Called to [facility].

## 2012-11-29 ENCOUNTER — Encounter: Payer: Self-pay | Admitting: Family Medicine

## 2013-02-01 ENCOUNTER — Encounter: Payer: Self-pay | Admitting: Family Medicine

## 2013-02-01 ENCOUNTER — Ambulatory Visit (INDEPENDENT_AMBULATORY_CARE_PROVIDER_SITE_OTHER): Payer: BC Managed Care – PPO | Admitting: Family Medicine

## 2013-02-01 VITALS — BP 124/74 | HR 79 | Temp 98.4°F | Wt 153.5 lb

## 2013-02-01 DIAGNOSIS — I10 Essential (primary) hypertension: Secondary | ICD-10-CM

## 2013-02-01 MED ORDER — AMLODIPINE BESY-BENAZEPRIL HCL 5-20 MG PO CAPS: 1.0000 | ORAL_CAPSULE | Freq: Every day | ORAL | Status: DC

## 2013-02-01 NOTE — Patient Instructions (Addendum)
Change back to 5/20 and notify Dr. Deborra Medina next week.  Take care.

## 2013-02-01 NOTE — Assessment & Plan Note (Signed)
Edema likely from the 5-->10mg  amlodipine.  Change back to 5mg , continue benazepril. She'll update PCP next week.  She has had a "prickly feeling" in her arms and legs but no focal, unilateral signs.  This isn't acute.  If it doesn't resolve with the med change, will defer to PCP.

## 2013-02-01 NOTE — Progress Notes (Signed)
BP med was changed to generic, but no med change.  She was on 5/20 to begin with, was changed to 10/20.    She was seen by an outside clinic in the interval.  Was started on clarithromycin for a presumed sinus infection.  She had some BLE edema and was told by the outside clinic it was related to the clarithromycin.  Not improved in meantime off the abx. Swelling resolves by AM, increased by the end of the day.  She skipped her  BP medicine one day and the swelling didn't happen.  She is more fatigued.    She has had a "prickly feeling" in her arms and legs but no focal, unilateral signs.    Meds, vitals, and allergies reviewed.   ROS: See HPI.  Otherwise, noncontributory.  GEN: nad, alert and oriented HEENT: mucous membranes moist NECK: supple w/o LA CV: rrr PULM: ctab, no inc wob ABD: soft, +bs EXT: trace edema SKIN: no acute rash

## 2013-02-03 ENCOUNTER — Ambulatory Visit: Payer: BC Managed Care – PPO | Admitting: Family Medicine

## 2013-02-07 ENCOUNTER — Telehealth: Payer: Self-pay | Admitting: Family Medicine

## 2013-02-07 MED ORDER — AMLODIPINE BESY-BENAZEPRIL HCL 5-20 MG PO CAPS: 1.0000 | ORAL_CAPSULE | Freq: Every day | ORAL | Status: DC

## 2013-02-07 NOTE — Telephone Encounter (Signed)
Pt saw Dr. Damita Dunnings last week and he changed lotrel to 520mg  from 1020mg  and pt has started having swelling in feet/legs and "terrible" heartburn and indigestion.  She is requesting that Dr. Deborra Medina change her back to name brand Lotrel 520mg .  Please try pt at work first and then at home.

## 2013-02-07 NOTE — Telephone Encounter (Signed)
Ok to change back to previous dose as pt requests but she needs to see me next week to recheck BP, sooner if she feels like BP is elevated.

## 2013-02-22 ENCOUNTER — Telehealth: Payer: Self-pay

## 2013-02-22 NOTE — Telephone Encounter (Signed)
Pt has changes insurance co and pt said generic Lotrel does not work for pt and causes legs to swell. Name brand Lotrel does not cause swelling. Pt has pd out of pocket for name brand but does not want to continue to pay out of pocket. Spoke with Casie at [facility] and cannot run Lotrel name brand until 02/24/13; at that time if needs prior auth or brand exception [facility] will fax request to Dr Deborra Medina. Pt voiced understanding.

## 2013-03-17 ENCOUNTER — Ambulatory Visit (INDEPENDENT_AMBULATORY_CARE_PROVIDER_SITE_OTHER): Payer: BC Managed Care – PPO | Admitting: Internal Medicine

## 2013-03-17 ENCOUNTER — Encounter: Payer: Self-pay | Admitting: Internal Medicine

## 2013-03-17 VITALS — BP 120/80 | HR 76 | Temp 98.4°F | Wt 156.5 lb

## 2013-03-17 DIAGNOSIS — H6693 Otitis media, unspecified, bilateral: Secondary | ICD-10-CM

## 2013-03-17 DIAGNOSIS — H6690 Otitis media, unspecified, unspecified ear: Secondary | ICD-10-CM

## 2013-03-17 MED ORDER — AMOXICILLIN 500 MG PO CAPS: 500.0000 mg | ORAL_CAPSULE | Freq: Three times a day (TID) | ORAL | Status: DC

## 2013-03-17 NOTE — Progress Notes (Signed)
HPI  Pt presents to the clinic today with c/o left ear pain/pressure and sore throat. This started 5 days ago. She does have a unproductive cough. She denies fever, chills or body aches. She has been sleeping with her ear on a heating pad which has helped. She has also taken Claritin, chloraseptic throat spray and tylenol. She reports this has helped some. She has no history of allergies or breathing problems. She denies sick contacts. Of note, she did have a common cold that started 2 weeks ago that has not yet cleared up.  Review of Systems      Past Medical History  Diagnosis Date  . Unspecified essential hypertension   . Syncope and collapse   . Tobacco use disorder     History reviewed. No pertinent family history.  History   Social History  . Marital Status: Married    Spouse Name: N/A    Number of Children: N/A  . Years of Education: N/A   Occupational History  . Not on file.   Social History Main Topics  . Smoking status: Current Every Day Smoker -- 1.00 packs/day    Types: Cigarettes  . Smokeless tobacco: Not on file  . Alcohol Use: No  . Drug Use: No  . Sexual Activity: Not on file   Other Topics Concern  . Not on file   Social History Narrative  . No narrative on file    Allergies  Allergen Reactions  . Buspar [Buspirone] Nausea Only    Sweating and dizzy  . Clarithromycin Swelling  . Codeine     REACTION: unspecified  . Doxycycline     REACTION: unspecified  . Famotidine     REACTION: rash  . Guaifenesin     REACTION: palpitations  . Latex     REACTION: causes rash  . Prednisone     REACTION: anxiety  . Shellfish-Derived Products     anaphylaxis  . Sulfonamide Derivatives     REACTION: unspecified  . Tetracycline     REACTION: nausea     Constitutional: Positive headache, fatigue and fever. Denies abrupt weight changes.  HEENT:  Positive ear pain and sore throat. Denies eye redness, eye pain, pressure behind the eyes, facial pain,  nasal congestion, ear pain, ringing in the ears, wax buildup, runny nose or bloody nose. Respiratory:  Denies cough, difficulty breathing or shortness of breath.  Cardiovascular: Denies chest pain, chest tightness, palpitations or swelling in the hands or feet.   No other specific complaints in a complete review of systems (except as listed in HPI above).  Objective:   BP 120/80  Pulse 76  Temp(Src) 98.4 F (36.9 C) (Oral)  Wt 156 lb 8 oz (70.988 kg)  BMI 26.46 kg/m2  SpO2 92% Wt Readings from Last 3 Encounters:  03/17/13 156 lb 8 oz (70.988 kg)  02/01/13 153 lb 8 oz (69.627 kg)  11/15/12 151 lb (68.493 kg)     General: Appears her stated age, well developed, well nourished in NAD. HEENT: Head: normal shape and size; Eyes: sclera white, no icterus, conjunctiva pink, PERRLA and EOMs intact; Ears: Tm's red, bulging, distorted light reflex, + effusion bilaterally; Nose: mucosa pink and moist, septum midline; Throat/Mouth: + PND. Teeth present, mucosa erythematous and moist, no exudate noted, no lesions or ulcerations noted.  Neck: Neck supple, trachea midline. No massses, lumps or thyromegaly present.  Cardiovascular: Normal rate and rhythm. S1,S2 noted.  No murmur, rubs or gallops noted. No JVD or BLE edema.  No carotid bruits noted. Pulmonary/Chest: Normal effort and positive vesicular breath sounds. No respiratory distress. No wheezes, rales or ronchi noted.      Assessment & Plan:   Bilateral Otitis Media:  Get some rest and drink plenty of water Do salt water gargles for the sore throat eRx for Amoxil x 10 days Continue Ibuprofen for pain/heating pad for comfort  RTC as needed or if symptoms persist.

## 2013-03-17 NOTE — Patient Instructions (Signed)

## 2013-03-28 ENCOUNTER — Other Ambulatory Visit: Payer: Self-pay | Admitting: *Deleted

## 2013-03-28 NOTE — Telephone Encounter (Signed)
Received faxed refill refill request from pharmacy. Last office visit 03/17/13 (acute). Is it okay to refill?

## 2013-03-29 MED ORDER — ALPRAZOLAM 0.25 MG PO TABS: 0.2500 mg | ORAL_TABLET | Freq: Three times a day (TID) | ORAL | Status: DC | PRN

## 2013-03-29 NOTE — Telephone Encounter (Signed)
Rx called to pharmacy as instructed. 

## 2013-05-16 ENCOUNTER — Telehealth: Payer: Self-pay | Admitting: Family Medicine

## 2013-05-16 NOTE — Telephone Encounter (Signed)
Pt request excuse from jury duty on 01/12 for anxiety and arthritis. Please advise. Call when ready for pick up.

## 2013-05-17 ENCOUNTER — Encounter: Payer: Self-pay | Admitting: *Deleted

## 2013-05-17 NOTE — Telephone Encounter (Signed)
Letter done, please print and give to patient. Thanks.

## 2013-05-17 NOTE — Telephone Encounter (Signed)
Letter printed.  Patient advised. Letter left at front desk for pick up.

## 2013-05-25 ENCOUNTER — Ambulatory Visit (INDEPENDENT_AMBULATORY_CARE_PROVIDER_SITE_OTHER): Payer: BC Managed Care – PPO | Admitting: Family Medicine

## 2013-05-25 ENCOUNTER — Encounter: Payer: Self-pay | Admitting: Family Medicine

## 2013-05-25 VITALS — BP 134/80 | HR 78 | Temp 98.5°F | Ht 65.5 in | Wt 159.8 lb

## 2013-05-25 DIAGNOSIS — F4321 Adjustment disorder with depressed mood: Secondary | ICD-10-CM

## 2013-05-25 DIAGNOSIS — R300 Dysuria: Secondary | ICD-10-CM

## 2013-05-25 LAB — POCT URINALYSIS DIPSTICK
Bilirubin, UA: NEGATIVE
Blood, UA: NEGATIVE
Glucose, UA: NEGATIVE
Ketones, UA: NEGATIVE
Leukocytes, UA: NEGATIVE
Nitrite, UA: NEGATIVE
Protein, UA: NEGATIVE
Spec Grav, UA: <=1.005
Urobilinogen, UA: 0.2
pH, UA: 6

## 2013-05-25 MED ORDER — CIPROFLOXACIN HCL 500 MG PO TABS: 500.0000 mg | ORAL_TABLET | Freq: Two times a day (BID) | ORAL | Status: DC

## 2013-05-25 NOTE — Progress Notes (Signed)
Pre-visit discussion using our clinic review tool. No additional management support is needed unless otherwise documented below in the visit note.  

## 2013-05-25 NOTE — Progress Notes (Signed)
Date:  05/25/2013   Name:  [name]   DOB:  01/20/1951   MRN:  [id number] Gender: female Age: 62 y.o.  PCP:  Arnette Norris, MD   History of Present Illness:  Patient presents with burning, urgency. No vaginal discharge or external irritation.  No STD exposure. No abd pain, no flank pain. Has some frequent urinary tract infection and having some back pain, for a couple of weeks now.  Not taking anything.   Patient Active Problem List   Diagnosis Date Noted  . HLD (hyperlipidemia) 11/19/2011  . Vaginal dryness, menopausal 11/19/2011  . Adjustment disorder 01/08/2011  . TOBACCO ABUSE 12/19/2008  . HYPERTENSION 12/19/2008    Past Medical History  Diagnosis Date  . Unspecified essential hypertension   . Syncope and collapse   . Tobacco use disorder     No past surgical history on file.  History   Social History  . Marital Status: Married    Spouse Name: N/A    Number of Children: N/A  . Years of Education: N/A   Occupational History  . Not on file.   Social History Main Topics  . Smoking status: Current Every Day Smoker -- 1.00 packs/day    Types: Cigarettes  . Smokeless tobacco: Never Used  . Alcohol Use: No  . Drug Use: No  . Sexual Activity: Not on file   Other Topics Concern  . Not on file   Social History Narrative  . No narrative on file    No family history on file.  Allergies  Allergen Reactions  . Buspar [Buspirone] Nausea Only    Sweating and dizzy  . Clarithromycin Swelling  . Codeine     REACTION: unspecified  . Doxycycline     REACTION: unspecified  . Famotidine     REACTION: rash  . Guaifenesin     REACTION: palpitations  . Latex     REACTION: causes rash  . Prednisone     REACTION: anxiety  . Shellfish-Derived Products     anaphylaxis  . Sulfonamide Derivatives     REACTION: unspecified  . Tetracycline     REACTION: nausea    Medication list reviewed and updated in full in [facility].  ROS: GEN:  no  fevers, chills. GI: No n/v/d, eating normally Otherwise, ROS is as per the HPI.  PHYSICAL EXAM  Filed Vitals:   05/25/13 1134  BP: 134/80  Pulse: 78  Temp: 98.5 F (36.9 C)  TempSrc: Oral  Height: 5' 5.5" (1.664 m)  Weight: 159 lb 12 oz (72.462 kg)    GEN: WDWN, A&Ox4,NAD. Non-toxic HEENT: Atraumatc, normocephalic. CV: RRR, No M/G/R PULM: CTA B, No wheezes, crackles, or rhonchi ABD: S, NT, ND, +BS, no rebound. No CVAT. No suprapubic tenderness. EXT: No c/c/e  Objective Data: Results for orders placed in visit on 05/25/13  POCT URINALYSIS DIPSTICK      Result Value Range   Color, UA yellow     Clarity, UA clear     Glucose, UA negative     Bilirubin, UA negative     Ketones, UA negative     Spec Grav, UA <=1.005     Blood, UA negative     pH, UA 6.0     Protein, UA negative     Urobilinogen, UA 0.2     Nitrite, UA negative     Leukocytes, UA Negative      A/P: UTI. Rx with ABX as below  Burning with urination - Plan: POCT Urinalysis Dipstick, Urine culture  Grief reaction  Additional time spent in discussion regarding her sister Brenda's passing.   There are no Patient Instructions on file for this visit.  Orders Today:  Orders Placed This Encounter  Procedures  . Urine culture  . POCT Urinalysis Dipstick    New medications, updates to list, dose adjustments: Meds ordered this encounter  Medications  . ciprofloxacin (CIPRO) 500 MG tablet    Sig: Take 1 tablet (500 mg total) by mouth 2 (two) times daily.    Dispense:  14 tablet    Refill:  0    Signed,  Travoris Bushey T. [place] Behlke, MD, [place] [facility] [place] [address] Phone: [phone] Fax: [phone]  Updated Complete Medication List:   Medication List       This list is accurate as of: 05/25/13  2:07 PM.  Always use your most recent med list.               acetaminophen 325 MG tablet  Commonly known as:  TYLENOL  Take  650 mg by mouth every 6 (six) hours as needed.     ALPRAZolam 0.25 MG tablet  Commonly known as:  XANAX  Take 1 tablet (0.25 mg total) by mouth 3 (three) times daily as needed.     amLODipine-benazepril 5-20 MG per capsule  Commonly known as:  LOTREL  Take 1 capsule by mouth daily.     ciprofloxacin 500 MG tablet  Commonly known as:  CIPRO  Take 1 tablet (500 mg total) by mouth 2 (two) times daily.     loratadine 10 MG tablet  Commonly known as:  CLARITIN  Take 10 mg by mouth daily as needed.

## 2013-05-27 LAB — URINE CULTURE: Colony Count: 2000

## 2013-06-28 ENCOUNTER — Ambulatory Visit (INDEPENDENT_AMBULATORY_CARE_PROVIDER_SITE_OTHER): Payer: BC Managed Care – PPO | Admitting: Internal Medicine

## 2013-06-28 ENCOUNTER — Encounter: Payer: Self-pay | Admitting: Internal Medicine

## 2013-06-28 VITALS — BP 148/90 | HR 72 | Temp 98.2°F | Wt 159.5 lb

## 2013-06-28 DIAGNOSIS — J209 Acute bronchitis, unspecified: Secondary | ICD-10-CM

## 2013-06-28 MED ORDER — AZITHROMYCIN 250 MG PO TABS: ORAL_TABLET | ORAL | Status: DC

## 2013-06-28 NOTE — Progress Notes (Signed)
HPI  Pt presents to the clinic today with c/o cough, chest congestion, sore throat and ear fullness. This started about 1 week ago. The cough is productive of thick, clear mucous. She has taken Robitussin DM which has helped. She has not history of asthma. She does take and antihistamine daily for allergies. She has had sick contacts.  Review of Systems      Past Medical History  Diagnosis Date  . Unspecified essential hypertension   . Syncope and collapse   . Tobacco use disorder     History reviewed. No pertinent family history.  History   Social History  . Marital Status: Married    Spouse Name: N/A    Number of Children: N/A  . Years of Education: N/A   Occupational History  . Not on file.   Social History Main Topics  . Smoking status: Current Every Day Smoker -- 1.00 packs/day    Types: Cigarettes  . Smokeless tobacco: Never Used  . Alcohol Use: No  . Drug Use: No  . Sexual Activity: Not on file   Other Topics Concern  . Not on file   Social History Narrative  . No narrative on file    Allergies  Allergen Reactions  . Buspar [Buspirone] Nausea Only    Sweating and dizzy  . Clarithromycin Swelling  . Codeine     REACTION: unspecified  . Doxycycline     REACTION: unspecified  . Famotidine     REACTION: rash  . Guaifenesin     REACTION: palpitations  . Latex     REACTION: causes rash  . Prednisone     REACTION: anxiety  . Shellfish-Derived Products     anaphylaxis  . Sulfonamide Derivatives     REACTION: unspecified  . Tetracycline     REACTION: nausea     Constitutional: Positive headache, fatigue and fever. Denies abrupt weight changes.  HEENT:  Positive sore throat. Denies eye redness, eye pain, pressure behind the eyes, facial pain, nasal congestion, ear pain, ringing in the ears, wax buildup, runny nose or bloody nose. Respiratory: Positive cough. Denies difficulty breathing or shortness of breath.  Cardiovascular: Denies chest pain,  chest tightness, palpitations or swelling in the hands or feet.   No other specific complaints in a complete review of systems (except as listed in HPI above).  Objective:   BP 148/90  Pulse 72  Temp(Src) 98.2 F (36.8 C) (Oral)  Wt 159 lb 8 oz (72.349 kg)  SpO2 96% Wt Readings from Last 3 Encounters:  06/28/13 159 lb 8 oz (72.349 kg)  05/25/13 159 lb 12 oz (72.462 kg)  03/17/13 156 lb 8 oz (70.988 kg)     General: Appears her stated age, well developed, well nourished in NAD. HEENT: Head: normal shape and size; Eyes: sclera white, no icterus, conjunctiva pink, PERRLA and EOMs intact; Ears: Tm's gray and intact, normal light reflex; Nose: mucosa pink and moist, septum midline; Throat/Mouth: + PND. Teeth present, mucosa erythematous and moist, no exudate noted, no lesions or ulcerations noted.  Neck: Mild cervical lymphadenopathy. Neck supple, trachea midline. No massses, lumps or thyromegaly present.  Cardiovascular: Normal rate and rhythm. S1,S2 noted.  No murmur, rubs or gallops noted. No JVD or BLE edema. No carotid bruits noted. Pulmonary/Chest: Normal effort and scattered rhonchi throughout. No respiratory distress. No wheezes, rales or ronchi noted.      Assessment & Plan:   Acute Bronchitis  Get some rest and drink plenty of water Do  salt water gargles for the sore throat eRx for Azithromax x 5 days Mucinex OTC  RTC as needed or if symptoms persist.

## 2013-06-28 NOTE — Progress Notes (Signed)
Pre-visit discussion using our clinic review tool. No additional management support is needed unless otherwise documented below in the visit note.  

## 2013-06-28 NOTE — Patient Instructions (Signed)

## 2013-07-08 ENCOUNTER — Encounter: Payer: Self-pay | Admitting: Family Medicine

## 2013-07-16 ENCOUNTER — Telehealth: Payer: Self-pay | Admitting: Family Medicine

## 2013-07-16 NOTE — Telephone Encounter (Signed)
Relevant patient education mailed to patient.  

## 2013-08-05 ENCOUNTER — Other Ambulatory Visit: Payer: Self-pay | Admitting: Family Medicine

## 2013-08-05 NOTE — Telephone Encounter (Signed)
Lm on pts vm informing her Rx has been sent to requested pharmacy 

## 2013-08-05 NOTE — Telephone Encounter (Signed)
Pt requesting medication refill. Last ov 11/2012 and last refill 03/2013. pls advise

## 2013-11-14 ENCOUNTER — Other Ambulatory Visit: Payer: Self-pay | Admitting: Family Medicine

## 2013-11-14 NOTE — Telephone Encounter (Signed)
Pt requesting medication refill. Last ov 06/2013-acute with [facility] with no future appts scheduled. pls advise

## 2013-11-15 NOTE — Telephone Encounter (Signed)
Rx called in to requested pharmacy 

## 2013-12-26 NOTE — H&P (Signed)
  [name] returns with a rather large mucoid cyst that has grown over her right thumb IP joint radial aspect. 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 She has had some osteoarthritis pain at her IP joint and occasionally the thumb CMC joint which she states she can tolerate.   We had previously injected her [place] joint with Depo Medrol and Lidocaine on 10-31-13. She has had good long term relief regarding her discomfort.   Her history is updated at this time. She is noted to have drug allergies to Codeine, sulfa, Prednisone and is intolerant of seafood. She can tolerate Betadine. Current medications include Lotrel 5/20 1 tablet daily and Xanax 0.25 mg once daily. Her surgical history, family history, social history and review of systems is updated and otherwise unchanged.   Physical examination reveals a well appearing 63 year old woman. Inspection of her hands reveals a 6 mm in diameter mucoid cyst that is getting quite thin and translucent on the radial dorsal aspect of her right thumb IP joint. She has palpable osteophytes with a significant osteophyte at the base of the distal phalanx and dorsal osteophyte on the proximal phalangeal head.   She is having quite a bit of difficulty using her scissors due to the position and size of the cyst. I have advised [name] to proceed with debridement of her IP joint. She fully understands that we cannot alter the natural history of her osteoarthritis with debridement. We can relieve the cyst temporarily and often permanently with debridement. She will have a tender scar following surgery that may be problematic for using her scissors for several weeks. She will bring her scissors to the OR so we can properly position our incision and to minimize this predicament. We will dbride her osteophyte, dbride her joint and perform a synovectomy and removal of loose bodies. The  surgery, after care, risks and benefits were described in detail.  We will schedule this at the minor OR at [facility] on Tuesday, 12-27-13 under straight local anesthesia.  H&P documentation: 12/27/2013  -History and Physical Reviewed  -Patient has been re-examined  -No change in the plan of care  Cammie Sickle, MD

## 2013-12-27 ENCOUNTER — Encounter (HOSPITAL_BASED_OUTPATIENT_CLINIC_OR_DEPARTMENT_OTHER)
Admission: RE | Disposition: A | Discharge status: Home / Self Care | Payer: Self-pay | Source: Ambulatory Visit | Attending: Orthopedic Surgery

## 2013-12-27 ENCOUNTER — Encounter (HOSPITAL_BASED_OUTPATIENT_CLINIC_OR_DEPARTMENT_OTHER): Payer: Self-pay | Admitting: *Deleted

## 2013-12-27 ENCOUNTER — Ambulatory Visit (HOSPITAL_BASED_OUTPATIENT_CLINIC_OR_DEPARTMENT_OTHER)
Admission: RE | Admit: 2013-12-27 | Discharge: 2013-12-27 | Disposition: A | Discharge status: Home / Self Care | Payer: BC Managed Care – PPO | Source: Ambulatory Visit | Attending: Orthopedic Surgery | Admitting: Orthopedic Surgery

## 2013-12-27 DIAGNOSIS — M659 Unspecified synovitis and tenosynovitis, unspecified site: Secondary | ICD-10-CM | POA: Insufficient documentation

## 2013-12-27 DIAGNOSIS — M6740 Ganglion, unspecified site: Secondary | ICD-10-CM | POA: Insufficient documentation

## 2013-12-27 DIAGNOSIS — F17200 Nicotine dependence, unspecified, uncomplicated: Secondary | ICD-10-CM | POA: Insufficient documentation

## 2013-12-27 DIAGNOSIS — M898X9 Other specified disorders of bone, unspecified site: Secondary | ICD-10-CM | POA: Insufficient documentation

## 2013-12-27 DIAGNOSIS — I10 Essential (primary) hypertension: Secondary | ICD-10-CM | POA: Insufficient documentation

## 2013-12-27 DIAGNOSIS — M1990 Unspecified osteoarthritis, unspecified site: Secondary | ICD-10-CM | POA: Insufficient documentation

## 2013-12-27 HISTORY — PX: MASS EXCISION: SHX2000

## 2013-12-27 SURGERY — MINOR EXCISION OF MASS
Anesthesia: LOCAL | Site: Thumb | Laterality: Right

## 2013-12-27 MED ORDER — LIDOCAINE HCL 2 % IJ SOLN
INTRAMUSCULAR | Status: DC | PRN
  Administered 2013-12-27: 4 mL

## 2013-12-27 SURGICAL SUPPLY — 58 items
BANDAGE ADH SHEER 1  50/CT (GAUZE/BANDAGES/DRESSINGS) IMPLANT
BANDAGE COBAN STERILE 2 (GAUZE/BANDAGES/DRESSINGS) IMPLANT
BANDAGE ELASTIC 3 VELCRO ST LF (GAUZE/BANDAGES/DRESSINGS) IMPLANT
BLADE MINI RND TIP GREEN BEAV (BLADE) IMPLANT
BLADE SURG 15 STRL LF DISP TIS (BLADE) ×1 IMPLANT
BLADE SURG 15 STRL SS (BLADE) ×2
BNDG CMPR 9X4 STRL LF SNTH (GAUZE/BANDAGES/DRESSINGS)
BNDG CMPR MD 5X2 ELC HKLP STRL (GAUZE/BANDAGES/DRESSINGS)
BNDG COHESIVE 1X5 TAN STRL LF (GAUZE/BANDAGES/DRESSINGS) ×1 IMPLANT
BNDG COHESIVE 3X5 TAN STRL LF (GAUZE/BANDAGES/DRESSINGS) IMPLANT
BNDG ELASTIC 2 VLCR STRL LF (GAUZE/BANDAGES/DRESSINGS) IMPLANT
BNDG ESMARK 4X9 LF (GAUZE/BANDAGES/DRESSINGS) IMPLANT
BNDG GAUZE ELAST 4 BULKY (GAUZE/BANDAGES/DRESSINGS) IMPLANT
BRUSH SCRUB EZ PLAIN DRY (MISCELLANEOUS) ×2 IMPLANT
CORDS BIPOLAR (ELECTRODE) IMPLANT
COVER MAYO STAND STRL (DRAPES) ×2 IMPLANT
COVER TABLE BACK 60X90 (DRAPES) ×2 IMPLANT
CUFF TOURNIQUET SINGLE 18IN (TOURNIQUET CUFF) IMPLANT
DECANTER SPIKE VIAL GLASS SM (MISCELLANEOUS) IMPLANT
DRAIN PENROSE 1/2X12 LTX STRL (WOUND CARE) ×1 IMPLANT
DRAIN PENROSE 1/4X12 LTX STRL (WOUND CARE) IMPLANT
DRAPE EXTREMITY T 121X128X90 (DRAPE) ×2 IMPLANT
DRAPE SURG 17X23 STRL (DRAPES) ×2 IMPLANT
GAUZE SPONGE 4X4 12PLY STRL (GAUZE/BANDAGES/DRESSINGS) IMPLANT
GAUZE XEROFORM 1X8 LF (GAUZE/BANDAGES/DRESSINGS) ×1 IMPLANT
GLOVE BIOGEL M STRL SZ7.5 (GLOVE) ×1 IMPLANT
GLOVE BIOGEL PI IND STRL 7.0 (GLOVE) IMPLANT
GLOVE BIOGEL PI INDICATOR 7.0 (GLOVE) ×1
GLOVE ORTHO TXT STRL SZ7.5 (GLOVE) ×1 IMPLANT
GLOVE SURG SS PI 6.5 STRL IVOR (GLOVE) ×1 IMPLANT
GLOVE SURG SS PI 7.5 STRL IVOR (GLOVE) ×1 IMPLANT
GOWN STRL REUS W/ TWL LRG LVL3 (GOWN DISPOSABLE) ×1 IMPLANT
GOWN STRL REUS W/ TWL XL LVL3 (GOWN DISPOSABLE) ×2 IMPLANT
GOWN STRL REUS W/TWL LRG LVL3 (GOWN DISPOSABLE) ×2
GOWN STRL REUS W/TWL XL LVL3 (GOWN DISPOSABLE) ×2
NDL BLUNT 17GA (NEEDLE) IMPLANT
NEEDLE 27GAX1X1/2 (NEEDLE) ×1 IMPLANT
NEEDLE BLUNT 17GA (NEEDLE) ×2 IMPLANT
NS IRRIG 1000ML POUR BTL (IV SOLUTION) ×1 IMPLANT
PACK BASIN DAY SURGERY FS (CUSTOM PROCEDURE TRAY) ×2 IMPLANT
PADDING CAST ABS 4INX4YD NS (CAST SUPPLIES)
PADDING CAST ABS COTTON 4X4 ST (CAST SUPPLIES) ×1 IMPLANT
PADDING UNDERCAST 2 STRL (CAST SUPPLIES)
PADDING UNDERCAST 2X4 STRL (CAST SUPPLIES) IMPLANT
STOCKINETTE 4X48 STRL (DRAPES) ×2 IMPLANT
STRIP CLOSURE SKIN 1/2X4 (GAUZE/BANDAGES/DRESSINGS) IMPLANT
SUT ETHILON 5 0 P 3 18 (SUTURE)
SUT MERSILENE 4 0 P 3 (SUTURE) IMPLANT
SUT NYLON ETHILON 5-0 P-3 1X18 (SUTURE) IMPLANT
SUT PROLENE 3 0 PS 2 (SUTURE) IMPLANT
SUT PROLENE 4 0 P 3 18 (SUTURE) IMPLANT
SYR 3ML 23GX1 SAFETY (SYRINGE) IMPLANT
SYRINGE 20CC LL (MISCELLANEOUS) ×1 IMPLANT
SYRINGE CONTROL L 12CC (SYRINGE) ×2 IMPLANT
SYRINGE CONTROL LL 12CC (SYRINGE) IMPLANT
TOWEL OR 17X24 6PK STRL BLUE (TOWEL DISPOSABLE) ×4 IMPLANT
TRAY DSU PREP LF (CUSTOM PROCEDURE TRAY) ×2 IMPLANT
UNDERPAD 30X30 INCONTINENT (UNDERPADS AND DIAPERS) ×2 IMPLANT

## 2013-12-27 NOTE — Op Note (Signed)
NAMEKENZA, LEMOS NO.:  [id number]  MEDICAL RECORD NO.:  [id number]  LOCATION:                                 FACILITY:  PHYSICIAN:  Youlanda Mighty. Dierks Wach, M.D.      DATE OF BIRTH:  DATE OF PROCEDURE:  12/27/2013 DATE OF DISCHARGE:                              OPERATIVE REPORT   PREOPERATIVE DIAGNOSES:  Large translucent myxoid cyst, dorsal radial aspect right thumb IP joint with large marginal osteophytes radial base of distal phalanx, and proximal phalangeal head.  POSTOPERATIVE DIAGNOSES:  Large translucent myxoid cyst, dorsal radial aspect right thumb IP joint with large marginal osteophytes radial base of distal phalanx, and proximal phalangeal head with significant synovitis of interphalangeal joint, thumb.  OPERATION: 1. Right thumb arthrotomy with removal of synovitis, loose bodies, and     excision of marginal osteophytes at base of distal phalanx, and     proximal phalangeal head. 2. Excision of right thumb mucoid cyst.  OPERATING SURGEON:  Youlanda Mighty. Linzy Darling, MD  ASSISTANT:  Surgical technician.  ANESTHESIA:  A 2% lidocaine metacarpal head level block of right thumb, this was performed as a minor operating room procedure.  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  She has a history of developing a large mucoid cyst measuring more than 6 mm diameter over the dorsal radial aspect of her right thumb.  This was quite problematic for her using her scissors while cutting hair.  She has not responded to splinting, activity modification, and anti- inflammatory medication.  She requested this be excised.  Clinical examination in the office revealed signs of significant osteoarthritis of her interphalangeal joint.  She has a large marginal osteophyte at the base of her distal phalanx at the radial aspect of the articulation and a large dorsal osteophyte at the  proximal phalangeal head.  We advised Ms. Bannan that we could not affect the natural history of her osteoarthritis, however, it is understood that debridement of the joint and cyst excision typically will lead to a prolonged period of relief from mucoid cyst formation.  She understands that we cannot guarantee that she will never develop another mucoid cyst with debridement however.  Preoperatively, she was advised the risks and benefits of surgery. Primary risk would be infection following surgery and the fact that she may have postoperative pain and numbness around the nail fold.  Benefit would be relief of her problematic myxoid cyst and avoidance of joint sepsis with cyst rupture.  Questions were invited and answered in detail.  PROCEDURE:  Cayce Tanna was interviewed in the holding area and her right thumb identified as the proper surgical site per protocol with a marking pen.  She was transferred to room 6 of the [facility] and placed in supine position on the operating table.  Following Betadine prep and informed consent, 4 mL of 2% lidocaine were infiltrated metacarpal head level to obtain a digital block.  Excellent anesthesia was achieved.  The right hand and arm were then prepped with Betadine soap and solution and sterilely draped.  Upon exsanguination of the right thumb  by direct compression, a 0.5-inch Penrose drain was placed at the proximal phalangeal base as a digital tourniquet.  Following routine surgical time-out, procedure commenced with an incision placed above the mid lateral line to avoid contact when she use her scissors later.  Subcutaneous tissues were carefully divided identifying the radial aspect of the extensor tendon.  The tendon was dissected free from the capsule of the IP joint and the capsule entered.  A capsulotomy was performed between the terminal extensor tendon slip and the radial collateral ligament.  A very large  osteophyte at the base of the distal phalanx was excised as well as multiple loose bodies at the extensor insertion.  A large dorsal osteophyte on the radial aspect of the proximal phalangeal head that was articulating against the distal phalangeal osteophyte was likewise excised.  Synovectomy of the IP joint  dorsal aspect was then accomplished with a micro rongeur followed by irrigation, removing loose bodies, and cartilaginous debris.  We then followed the neck of the cyst distally and excised a 6 x 7 mm myxoid cyst from the subcutaneous region of the distal phalanx.  There was no nail deformity noted.  After completion of the dissection, hemostasis was achieved by direct pressure.  The wound was then repaired with a pair of intradermal 4-0 Prolene sutures.  The thumb was dressed with Xeroflo.  The tourniquet was released and hemostasis achieved by direct pressure.  The thumb was then dressed with compressive dressing of sterile gauze and Coban anchored at wrist level.  There were no apparent complications.  For aftercare, Ms. Lionetti is provided prescription for tramadol 50 mg 1 p.o. q.4-6 hours p.r.n. pain, 20 tabs without refill.  She will likely use Tylenol as a primary analgesic.     Youlanda Mighty Eliberto Sole, M.D.     RVS/MEDQ  D:  12/27/2013  T:  12/27/2013  Job:  [id number]

## 2013-12-27 NOTE — Op Note (Signed)
[id number] 

## 2013-12-27 NOTE — Discharge Instructions (Signed)
[facility] Instructions Hand Surgery  Wound Care: Keep your hand elevated above the level of your heart.  Do not allow it to dangle by your side.  Keep the dressing dry and do not remove it unless your doctor advises you to do so.  He will usually change it at the time of your post-op visit.  Moving your fingers is advised to stimulate circulation but will depend on the site of your surgery.  If you have a splint applied, your doctor will advise you regarding movement.  Activity: Do not drive or operate machinery today.  Rest today and then you may return to your normal activity and work as indicated by your physician.  Diet:  Drink liquids today or eat a light diet.  You may resume a regular diet tomorrow.    General expectations: Pain for two to three days. Fingers may become slightly swollen.  Call your doctor if any of the following occur: Severe pain not relieved by pain medication. Elevated temperature. Dressing soaked with blood. Inability to move fingers. White or bluish color to fingers.   Come to office in the Morning of 12/28/13 for a bandage change.

## 2013-12-27 NOTE — Brief Op Note (Signed)
12/27/2013  9:23 AM  PATIENT:  [name]  63 y.o. female  PRE-OPERATIVE DIAGNOSIS:  Right Thumb Mucoid cyst, arthritis of interphalangeal joint  POST-OPERATIVE DIAGNOSIS:  Arthritis of interphalangeal joint and large mucoid cyst  PROCEDURE:  Procedure(s): RIGHT THUMB MUCOID CYST EXCISION DEBRIDE INTERPHALANGEAL JOINT (Right)  SURGEON:  Surgeon(s) and Role:    * Cammie Sickle, MD - Primary  PHYSICIAN ASSISTANT:   ASSISTANTS: surgical tech  ANESTHESIA:   local  EBL:     BLOOD ADMINISTERED:none  DRAINS: none   LOCAL MEDICATIONS USED:  XYLOCAINE   SPECIMEN:  No Specimen  DISPOSITION OF SPECIMEN:  N/A  COUNTS:  YES  TOURNIQUET:    DICTATION: .Other Dictation: Dictation Number [id number]  PLAN OF CARE: Discharge to home after PACU  PATIENT DISPOSITION:  PACU - hemodynamically stable.   Delay start of Pharmacological VTE agent (>24hrs) due to surgical blood loss or risk of bleeding: not applicable

## 2013-12-27 NOTE — H&P (Signed)
[name] is an 63 y.o. female.   Chief Complaint: 6 mm mucoid cyst of right thumb IP joint. 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  Background osteoarthritis of small joints.  Past Medical History  Diagnosis Date  . Unspecified essential hypertension   . Syncope and collapse   . Tobacco use disorder     No past surgical history on file.  No family history on file. Social History:  reports that she has been smoking Cigarettes.  She has been smoking about 1.00 pack per day. She has never used smokeless tobacco. She reports that she does not drink alcohol or use illicit drugs.  Allergies:  Allergies  Allergen Reactions  . Buspar [Buspirone] Nausea Only    Sweating and dizzy  . Clarithromycin Swelling  . Codeine     REACTION: unspecified  . Doxycycline     REACTION: unspecified  . Famotidine     REACTION: rash  . Guaifenesin     REACTION: palpitations  . Latex     REACTION: causes rash  . Prednisone     REACTION: anxiety  . Shellfish-Derived Products     anaphylaxis  . Sulfonamide Derivatives     REACTION: unspecified  . Tetracycline     REACTION: nausea    No prescriptions prior to admission    No results found for this or any previous visit (from the past 48 hour(s)). No results found.  Review of Systems  Constitutional: Negative.   HENT: Negative.   Eyes:       Corrective lenses  Respiratory: Negative.   Cardiovascular: Negative.        High blood pressure  Gastrointestinal: Negative.   Genitourinary: Negative.   Musculoskeletal:       Mild osteoarthritis, mucoid cyst right thumb IP joint  Skin: Negative.   Neurological: Negative.   Endo/Heme/Allergies: Negative.   Psychiatric/Behavioral: Negative.     There were no vitals taken for this visit. Physical Exam  Constitutional: She is oriented to person, place, and time. She appears well-developed and well-nourished.  HENT:   Head: Normocephalic and atraumatic.  Eyes: Conjunctivae and EOM are normal. Pupils are equal, round, and reactive to light.  Neck: Normal range of motion. Neck supple.  Cardiovascular: Normal rate and regular rhythm.   Respiratory: Effort normal and breath sounds normal.  GI: Soft. Bowel sounds are normal.  Musculoskeletal: Normal range of motion.  6 mm translucent mucoid cyst of right thumb IP joint Palpable osteophytes of thumb distal phalangeal base and proximal phalangeal head  Neurological: She is alert and oriented to person, place, and time.  Skin: Skin is warm and dry.  Psychiatric: She has a normal mood and affect. Her behavior is normal. Judgment and thought content normal.     Assessment/Plan Mucoid cyst of right thumb IP joint with osteophytes of distal an proximal phalangeal joint surfaces.  Cyst excision and joint debridement.  [name] JR,[name] V 12/27/2013, 6:20 AM

## 2013-12-28 ENCOUNTER — Encounter (HOSPITAL_BASED_OUTPATIENT_CLINIC_OR_DEPARTMENT_OTHER): Payer: Self-pay | Admitting: Orthopedic Surgery

## 2014-01-25 ENCOUNTER — Encounter: Payer: Self-pay | Admitting: Family Medicine

## 2014-01-25 ENCOUNTER — Ambulatory Visit (INDEPENDENT_AMBULATORY_CARE_PROVIDER_SITE_OTHER): Payer: BC Managed Care – PPO | Admitting: Family Medicine

## 2014-01-25 VITALS — BP 130/90 | HR 68 | Temp 97.8°F | Wt 161.2 lb

## 2014-01-25 DIAGNOSIS — R300 Dysuria: Secondary | ICD-10-CM

## 2014-01-25 DIAGNOSIS — R350 Frequency of micturition: Secondary | ICD-10-CM

## 2014-01-25 LAB — POCT URINALYSIS DIPSTICK
Bilirubin, UA: NEGATIVE
Blood, UA: NEGATIVE
Glucose, UA: NEGATIVE
Ketones, UA: NEGATIVE
Leukocytes, UA: NEGATIVE
Nitrite, UA: NEGATIVE
Protein, UA: NEGATIVE
Spec Grav, UA: 1.01
Urobilinogen, UA: 4
pH, UA: 6

## 2014-01-25 MED ORDER — CIPROFLOXACIN HCL 500 MG PO TABS: 500.0000 mg | ORAL_TABLET | Freq: Two times a day (BID) | ORAL | Status: DC

## 2014-01-25 NOTE — Progress Notes (Signed)
SUBJECTIVE: [name] is a 63 y.o. female who complains of urinary frequency, urgency and dysuria x 3 days, without flank pain, fever, chills, or abnormal vaginal discharge or bleeding.   Current Outpatient Prescriptions on File Prior to Visit  Medication Sig Dispense Refill  . acetaminophen (TYLENOL) 325 MG tablet Take 650 mg by mouth every 6 (six) hours as needed.        . ALPRAZolam (XANAX) 0.25 MG tablet TAKE ONE TABLET BY MOUTH 3 TIMES DAILY AS NEEDED.  90 tablet  0  . amLODipine-benazepril (LOTREL) 5-20 MG per capsule Take 1 capsule by mouth daily.  30 capsule  12  . loratadine (CLARITIN) 10 MG tablet Take 10 mg by mouth daily as needed.         No current facility-administered medications on file prior to visit.    Allergies  Allergen Reactions  . Buspar [Buspirone] Nausea Only    Sweating and dizzy  . Clarithromycin Swelling  . Codeine     REACTION: unspecified  . Doxycycline     REACTION: unspecified  . Famotidine     REACTION: rash  . Guaifenesin     REACTION: palpitations  . Latex     REACTION: causes rash  . Prednisone     REACTION: anxiety  . Shellfish-Derived Products     anaphylaxis  . Sulfonamide Derivatives     REACTION: unspecified  . Tetracycline     REACTION: nausea    Past Medical History  Diagnosis Date  . Unspecified essential hypertension   . Syncope and collapse   . Tobacco use disorder     Past Surgical History  Procedure Laterality Date  . Mass excision Right 12/27/2013    Procedure: MINOR EXCISION OF RIGHT THUMB MUCOID CYST, DEBRIDEMENT OF INTERPHALANGEAL JOINT;  Surgeon: Cammie Sickle, MD;  Location: [facility];  Service: Orthopedics;  Laterality: Right;    No family history on file.  History   Social History  . Marital Status: Married    Spouse Name: N/A    Number of Children: N/A  . Years of Education: N/A   Occupational History  . Not on file.   Social History Main Topics  . Smoking status: Current  Every Day Smoker -- 1.00 packs/day    Types: Cigarettes  . Smokeless tobacco: Never Used  . Alcohol Use: No  . Drug Use: No  . Sexual Activity: Not on file   Other Topics Concern  . Not on file   Social History Narrative  . No narrative on file   The PMH, PSH, Social History, Family History, Medications, and allergies have been reviewed in [facility], and have been updated if relevant.  OBJECTIVE:  BP 130/90  Pulse 68  Temp(Src) 97.8 F (36.6 C) (Oral)  Wt 161 lb 4 oz (73.143 kg)  SpO2 96% Appears well, in no apparent distress.  Vital signs are normal. The abdomen is soft without tenderness, guarding, mass, rebound or organomegaly. No CVA tenderness or inguinal adenopathy noted. Urine dipstick shows neg ASSESSMENT: UTI uncomplicated without evidence of pyelonephritis-  PLAN: Treatment per orders -neg UA but classic symptoms so will treat for UTI with 3 day course of cipro 500 mg twice daily x 3 days,  also push fluids, may use Pyridium OTC prn. Call or return to clinic prn if these symptoms worsen or fail to improve as anticipated.

## 2014-01-25 NOTE — Progress Notes (Signed)
Pre visit review using our clinic review tool, if applicable. No additional management support is needed unless otherwise documented below in the visit note. 

## 2014-01-25 NOTE — Patient Instructions (Signed)
Great to see you. Please take cipro as directed- 1 tablet twice daily for 3 days.  Call me if no improvement.

## 2014-02-06 ENCOUNTER — Other Ambulatory Visit: Payer: Self-pay | Admitting: Family Medicine

## 2014-02-06 NOTE — Telephone Encounter (Signed)
Rx faxed to requested pharmacy 

## 2014-02-06 NOTE — Telephone Encounter (Signed)
Pt requesting medication refill. Last f/u appt 11/2012 with upcoming 01/2014 appt. pls advise

## 2014-02-08 ENCOUNTER — Encounter: Payer: Self-pay | Admitting: Family Medicine

## 2014-02-08 ENCOUNTER — Ambulatory Visit (INDEPENDENT_AMBULATORY_CARE_PROVIDER_SITE_OTHER): Payer: BC Managed Care – PPO | Admitting: Family Medicine

## 2014-02-08 ENCOUNTER — Encounter: Payer: Self-pay | Admitting: *Deleted

## 2014-02-08 ENCOUNTER — Encounter (INDEPENDENT_AMBULATORY_CARE_PROVIDER_SITE_OTHER): Payer: Self-pay

## 2014-02-08 ENCOUNTER — Ambulatory Visit (INDEPENDENT_AMBULATORY_CARE_PROVIDER_SITE_OTHER)
Admission: RE | Admit: 2014-02-08 | Discharge: 2014-02-08 | Disposition: A | Discharge status: Home / Self Care | Payer: BC Managed Care – PPO | Source: Ambulatory Visit | Attending: Family Medicine | Admitting: Family Medicine

## 2014-02-08 VITALS — BP 128/78 | HR 73 | Temp 98.1°F | Wt 161.2 lb

## 2014-02-08 DIAGNOSIS — R197 Diarrhea, unspecified: Secondary | ICD-10-CM | POA: Insufficient documentation

## 2014-02-08 DIAGNOSIS — R5383 Other fatigue: Secondary | ICD-10-CM

## 2014-02-08 DIAGNOSIS — R300 Dysuria: Secondary | ICD-10-CM

## 2014-02-08 DIAGNOSIS — R112 Nausea with vomiting, unspecified: Secondary | ICD-10-CM

## 2014-02-08 DIAGNOSIS — R5381 Other malaise: Secondary | ICD-10-CM | POA: Insufficient documentation

## 2014-02-08 DIAGNOSIS — E559 Vitamin D deficiency, unspecified: Secondary | ICD-10-CM

## 2014-02-08 DIAGNOSIS — F4323 Adjustment disorder with mixed anxiety and depressed mood: Secondary | ICD-10-CM

## 2014-02-08 DIAGNOSIS — R198 Other specified symptoms and signs involving the digestive system and abdomen: Secondary | ICD-10-CM | POA: Insufficient documentation

## 2014-02-08 DIAGNOSIS — R1011 Right upper quadrant pain: Secondary | ICD-10-CM | POA: Insufficient documentation

## 2014-02-08 DIAGNOSIS — G4700 Insomnia, unspecified: Secondary | ICD-10-CM | POA: Insufficient documentation

## 2014-02-08 LAB — CBC WITH DIFFERENTIAL/PLATELET
Basophils Absolute: 0.1 10*3/uL (ref 0.0–0.1)
Basophils Relative: 0.6 % (ref 0.0–3.0)
Eosinophils Absolute: 0.2 10*3/uL (ref 0.0–0.7)
Eosinophils Relative: 1.8 % (ref 0.0–5.0)
HCT: 41 % (ref 36.0–46.0)
Hemoglobin: 14 g/dL (ref 12.0–15.0)
Lymphocytes Relative: 25 % (ref 12.0–46.0)
Lymphs Abs: 2.3 10*3/uL (ref 0.7–4.0)
MCHC: 34.1 g/dL (ref 30.0–36.0)
MCV: 87 fl (ref 78.0–100.0)
Monocytes Absolute: 0.5 10*3/uL (ref 0.1–1.0)
Monocytes Relative: 5.2 % (ref 3.0–12.0)
Neutro Abs: 6.1 10*3/uL (ref 1.4–7.7)
Neutrophils Relative %: 67.4 % (ref 43.0–77.0)
Platelets: 261 10*3/uL (ref 150.0–400.0)
RBC: 4.71 Mil/uL (ref 3.87–5.11)
RDW: 14.1 % (ref 11.5–15.5)
WBC: 9 10*3/uL (ref 4.0–10.5)

## 2014-02-08 LAB — POCT URINALYSIS DIPSTICK
Bilirubin, UA: NEGATIVE
Blood, UA: NEGATIVE
Glucose, UA: NEGATIVE
Ketones, UA: NEGATIVE
Leukocytes, UA: NEGATIVE
Nitrite, UA: NEGATIVE
Protein, UA: NEGATIVE
Spec Grav, UA: 1.015
Urobilinogen, UA: 2
pH, UA: 6

## 2014-02-08 LAB — COMPREHENSIVE METABOLIC PANEL
ALT: 15 U/L (ref 0–35)
AST: 19 U/L (ref 0–37)
Albumin: 4.1 g/dL (ref 3.5–5.2)
Alkaline Phosphatase: 69 U/L (ref 39–117)
BUN: 9 mg/dL (ref 6–23)
CO2: 25 mEq/L (ref 19–32)
Calcium: 8.9 mg/dL (ref 8.4–10.5)
Chloride: 106 mEq/L (ref 96–112)
Creatinine, Ser: 1.2 mg/dL (ref 0.4–1.2)
GFR: 48.14 mL/min — ABNORMAL LOW (ref >60.00)
Glucose, Bld: 89 mg/dL (ref 70–99)
Potassium: 3.4 mEq/L — ABNORMAL LOW (ref 3.5–5.1)
Sodium: 139 mEq/L (ref 135–145)
Total Bilirubin: 0.4 mg/dL (ref 0.2–1.2)
Total Protein: 7.2 g/dL (ref 6.0–8.3)

## 2014-02-08 LAB — VITAMIN D 25 HYDROXY (VIT D DEFICIENCY, FRACTURES): VITD: 19.4 ng/mL — ABNORMAL LOW (ref 30.00–100.00)

## 2014-02-08 LAB — T4, FREE: Free T4: 1.04 ng/dL (ref 0.60–1.60)

## 2014-02-08 LAB — TSH: TSH: 0.19 u[IU]/mL — ABNORMAL LOW (ref 0.35–4.50)

## 2014-02-08 LAB — VITAMIN B12: Vitamin B-12: 309 pg/mL (ref 211–911)

## 2014-02-08 MED ORDER — AMLODIPINE BESY-BENAZEPRIL HCL 5-20 MG PO CAPS: 1.0000 | ORAL_CAPSULE | Freq: Every day | ORAL | Status: DC

## 2014-02-08 MED ORDER — ALPRAZOLAM 0.25 MG PO TABS: ORAL_TABLET | ORAL | Status: DC

## 2014-02-08 NOTE — Progress Notes (Signed)
   Subjective:   Patient ID: [name], female    DOB: 1951/01/28, 63 y.o.   MRN: [id number]  [name] is a pleasant 63 y.o. year old female who presents to clinic today with Dysuria, Nausea and Fatigue  on 02/08/2014  HPI: Persistent dysuria-  saw her on 8/12 for classic UTI symptoms.  Treated with cipro 500 mg twice daily although UA was neg due to classic symptoms.  Symptoms improved for 2-3 days then returned.  Having dysuria, no hematuria.  Now nauseated with intermittent constipation and diarrhea and fatigue for past several weeks.  She is getting more fatigued. She admits to not sleeping well but this has not changed recently- takes xanax as needed at bedtime.  Admits to being more sad recently- sister died and husband has been more manic recently. Intolerant to multiple anxiolytics and antidepressant.   Denies SI or HI.  No blood in her stool.  No results found for this basename: TSH   Lab Results  Component Value Date   NA 141 11/12/2011   K 3.5 11/12/2011   CL 105 11/12/2011   CO2 29 11/12/2011      Review of Systems    See HPI +intermittent RUQ pain + back pain +nausea, no vomiting No blood in her stool No panic attacks Appetite good- weight stable Wt Readings from Last 3 Encounters:  02/08/14 161 lb 4 oz (73.143 kg)  01/25/14 161 lb 4 oz (73.143 kg)  06/28/13 159 lb 8 oz (72.349 kg)     Objective:    BP 128/78  Pulse 73  Temp(Src) 98.1 F (36.7 C) (Oral)  Wt 161 lb 4 oz (73.143 kg)  SpO2 97%   Physical Exam  Nursing note and vitals reviewed. Constitutional: She is oriented to person, place, and time. She appears well-developed and well-nourished. No distress.  HENT:  Head: Normocephalic.  Eyes: Pupils are equal, round, and reactive to light.  Neck: Normal range of motion.  Abdominal: Soft. Bowel sounds are normal. There is tenderness.  Mildly TTP RUQ No rebound or guarding  Genitourinary:  No CVA or suprapubic tenderness    Neurological: She is alert and oriented to person, place, and time.  Skin: Skin is warm and dry.  Psychiatric: She has a normal mood and affect. Her behavior is normal. Judgment and thought content normal.          Assessment & Plan:   Dysuria - Plan: Urinalysis Dipstick  Other malaise and fatigue No Follow-up on file.

## 2014-02-08 NOTE — Assessment & Plan Note (Signed)
Deteriorated. No SI or HI. Deferring psychotherapy. Declines pharmacotherapy.

## 2014-02-08 NOTE — Patient Instructions (Signed)
Good to see you. Hang in there. I will call you with your xray and lab results.

## 2014-02-08 NOTE — Assessment & Plan Note (Signed)
Intermittent- without vomiting Probable IBS. Refusing colonoscopy.  Does not want to add other rx.

## 2014-02-08 NOTE — Assessment & Plan Note (Signed)
New- most likely multifactorial. Worsened by depression and insomnia. Will check labs today as well.

## 2014-02-08 NOTE — Assessment & Plan Note (Signed)
Intermittent Remote h/o cholecystectomy. ?Gas pain/IBS Will start with lab work and KUB today. May need further imaging.

## 2014-02-08 NOTE — Progress Notes (Signed)
Pre visit review using our clinic review tool, if applicable. No additional management support is needed unless otherwise documented below in the visit note. 

## 2014-02-08 NOTE — Assessment & Plan Note (Signed)
Persistent. UA neg again today. No signs of RBCs on micro. Does have h/o kidney stones.  Will order KUB today.

## 2014-02-08 NOTE — Addendum Note (Signed)
Addended by: Ellamae Sia on: 02/08/2014 11:11 AM   Modules accepted: Orders

## 2014-02-09 ENCOUNTER — Other Ambulatory Visit: Payer: Self-pay | Admitting: Family Medicine

## 2014-02-09 DIAGNOSIS — R7989 Other specified abnormal findings of blood chemistry: Secondary | ICD-10-CM

## 2014-02-09 MED ORDER — VITAMIN D (ERGOCALCIFEROL) 1.25 MG (50000 UNIT) PO CAPS: 50000.0000 [IU] | ORAL_CAPSULE | ORAL | Status: DC

## 2014-02-09 NOTE — Addendum Note (Signed)
Addended by: Modena Nunnery on: 02/09/2014 03:42 PM   Modules accepted: Orders

## 2014-03-06 ENCOUNTER — Encounter: Payer: Self-pay | Admitting: Internal Medicine

## 2014-03-06 ENCOUNTER — Ambulatory Visit (INDEPENDENT_AMBULATORY_CARE_PROVIDER_SITE_OTHER): Payer: BC Managed Care – PPO | Admitting: Internal Medicine

## 2014-03-06 ENCOUNTER — Other Ambulatory Visit: Payer: Self-pay | Admitting: Family Medicine

## 2014-03-06 VITALS — BP 104/68 | HR 78 | Temp 98.4°F | Resp 12 | Ht 65.0 in | Wt 164.0 lb

## 2014-03-06 DIAGNOSIS — R946 Abnormal results of thyroid function studies: Secondary | ICD-10-CM

## 2014-03-06 DIAGNOSIS — R7989 Other specified abnormal findings of blood chemistry: Secondary | ICD-10-CM

## 2014-03-06 LAB — T3, FREE: T3, Free: 3.1 pg/mL (ref 2.3–4.2)

## 2014-03-06 LAB — T4, FREE: Free T4: 0.88 ng/dL (ref 0.60–1.60)

## 2014-03-06 LAB — TSH: TSH: 0.3 u[IU]/mL — ABNORMAL LOW (ref 0.35–4.50)

## 2014-03-06 NOTE — Patient Instructions (Signed)
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 Please stop at the lab >> we will decide for further plan when results are back. Please call me if you develop the following symptoms:  Hyperthyroidism The thyroid is a large gland located in the lower front part of your neck. The thyroid helps control metabolism. Metabolism is how your body uses food. It controls metabolism with the hormone thyroxine. When the thyroid is overactive, it produces too much hormone. When this happens, these following problems may occur:   Nervousness  Heat intolerance  Weight loss (in spite of increase food intake)  Diarrhea  Change in hair or skin texture  Palpitations (heart skipping or having extra beats)  Tachycardia (rapid heart rate)  Loss of menstruation (amenorrhea)  Shaking of the hands CAUSES  Grave's Disease (the immune system attacks the thyroid gland). This is the most common cause.  Inflammation of the thyroid gland.  Tumor (usually benign) in the thyroid gland or elsewhere.  Excessive use of thyroid medications (both prescription and 'natural').  Excessive ingestion of Iodine. DIAGNOSIS  To prove hyperthyroidism, your caregiver may do blood tests and ultrasound tests. Sometimes the signs are hidden. It may be necessary for your caregiver to watch this illness with blood tests, either before or after diagnosis and treatment. TREATMENT Short-term treatment There are several treatments to control symptoms. Drugs called beta blockers may give some relief. Drugs that decrease hormone production will provide temporary relief in many people. These measures will usually not give permanent relief. Definitive therapy There are treatments available which can be discussed between you and your caregiver which will permanently treat the problem. These treatments range from surgery (removal of the thyroid), to the use of radioactive iodine (destroys the thyroid by radiation), to the use of antithyroid drugs  (interfere with hormone synthesis). The first two treatments are permanent and usually successful. They most often require hormone replacement therapy for life. This is because it is impossible to remove or destroy the exact amount of thyroid required to make a person euthyroid (normal). HOME CARE INSTRUCTIONS  See your caregiver if the problems you are being treated for get worse. Examples of this would be the problems listed above. SEEK MEDICAL CARE IF: Your general condition worsens. MAKE SURE YOU:   Understand these instructions.  Will watch your condition.  Will get help right away if you are not doing well or get worse. Document Released: 06/02/2005 Document Revised: 08/25/2011 Document Reviewed: 10/14/2006 [facility] Patient Information 2015 [place], [place]. This information is not intended to replace advice given to you by your health care provider. Make sure you discuss any questions you have with your health care provider.

## 2014-03-06 NOTE — Progress Notes (Signed)
Patient ID: [name], female   DOB: Sep 09, 1950, 63 y.o.   MRN: [id number]   HPI  [name] is a 63 y.o.-year-old female, referred by her PCP, Dr. Deborra Medina, in consultation for a low TSH.  She was found to have a low TSH on 01/2014, as she was having exhausted, with mm aches, and flu-like sxs. One week prior to this, she felt like she had a UTI.   I reviewed pt's thyroid tests: Lab Results  Component Value Date   TSH 0.19* 02/08/2014   FREET4 1.04 02/08/2014    Pt denies feeling nodules in neck, hoarseness, dysphagia/odynophagia, SOB with lying down; she c/o: - no excessive sweating/heat intolerance; + cold intolerance - + tremors - + anxiety, no panic attacks in last 20 years - + palpitations - + fatigue - alternating diarrhea + constipation - + weight gain, but has initially lost weight (stress with husband)  Pt does not have a FH of thyroid ds. No FH of thyroid cancer. No h/o radiation tx to head or neck.  No seaweed or kelp, no recent contrast studies. No steroid use recently, but had a steroid inj in R finger. No herbal supplements.   I reviewed her chart and she also has a history of HTN, HL.  ROS: Constitutional: + see HPI Eyes: no blurry vision, no xerophthalmia ENT: no sore throat, no nodules palpated in throat, no dysphagia/odynophagia, no hoarseness Cardiovascular: no CP/SOB/palpitations/leg swelling Respiratory: no cough/SOB Gastrointestinal: no N/V/+ D/+ C/+ heartburn Musculoskeletal: + muscle/joint aches Skin: no rashes, + easy bruising, + itching, + hair loss Neurological: no tremors/numbness/tingling/dizziness Psychiatric: no depression/+ anxiety  Past Medical History  Diagnosis Date  . Unspecified essential hypertension   . Syncope and collapse   . Tobacco use disorder    Past Surgical History  Procedure Laterality Date  . Mass excision Right 12/27/2013    Procedure: MINOR EXCISION OF RIGHT THUMB MUCOID CYST, DEBRIDEMENT OF INTERPHALANGEAL JOINT;   Surgeon: Cammie Sickle, MD;  Location: [place];  Service: Orthopedics;  Laterality: Right;   History   Social History  . Marital Status: Married    Spouse Name: N/A    Number of Children: 2   Occupational History  . cosmetologist   Social History Main Topics  . Smoking status: Current Every Day Smoker -- 1.5 packs/day    Types: Cigarettes  . Smokeless tobacco: Never Used  . Alcohol Use: No  . Drug Use: No   Current Outpatient Prescriptions on File Prior to Visit  Medication Sig Dispense Refill  . acetaminophen (TYLENOL) 325 MG tablet Take 650 mg by mouth every 6 (six) hours as needed.        . ALPRAZolam (XANAX) 0.25 MG tablet TAKE ONE TABLET BY MOUTH 3 TIMES DAILY AS NEEDED.  90 tablet  0  . amLODipine-benazepril (LOTREL) 5-20 MG per capsule Take 1 capsule by mouth daily.  30 capsule  3  . loratadine (CLARITIN) 10 MG tablet Take 10 mg by mouth daily as needed.        . Vitamin D, Ergocalciferol, (DRISDOL) 50000 UNITS CAPS capsule Take 1 capsule (50,000 Units total) by mouth every 7 (seven) days.  6 capsule  0   No current facility-administered medications on file prior to visit.   Allergies  Allergen Reactions  . Buspar [Buspirone] Nausea Only    Sweating and dizzy  . Clarithromycin Swelling  . Codeine     REACTION: unspecified  . Doxycycline  REACTION: unspecified  . Famotidine     REACTION: rash  . Guaifenesin     REACTION: palpitations  . Latex     REACTION: causes rash  . Prednisone     REACTION: anxiety  . Shellfish-Derived Products     anaphylaxis  . Sulfonamide Derivatives     REACTION: unspecified  . Tetracycline     REACTION: nausea   History reviewed. No pertinent family history.  PE: BP 104/68  Pulse 78  Temp(Src) 98.4 F (36.9 C) (Oral)  Resp 12  Ht 5\' 5"  (1.651 m)  Wt 164 lb (74.39 kg)  BMI 27.29 kg/m2  SpO2 97% Wt Readings from Last 3 Encounters:  03/06/14 164 lb (74.39 kg)  02/08/14 161 lb 4 oz (73.143 kg)   01/25/14 161 lb 4 oz (73.143 kg)   Constitutional: overweight, in NAD Eyes: PERRLA, EOMI, no exophthalmos, no lid lag, no stare ENT: moist mucous membranes, no thyromegaly, no thyroid bruits, no cervical lymphadenopathy Cardiovascular: RRR, No MRG Respiratory: CTA B Gastrointestinal: abdomen soft, NT, ND, BS+ Musculoskeletal: no deformities, strength intact in all 4 Skin: moist, warm, no rashes Neurological: no tremor with outstretched hands, DTR normal in all 4  ASSESSMENT: 1. Low TSH  PLAN:  1. Patient with a recently found low TSH, without thyrotoxic sxs: no weight loss, heat intolerance, hyperdefecation, but with occasional palpitations, and long-standinganxiety.  - she does not appear to have exogenous causes for the low TSH.  - We discussed that possible causes of thyrotoxicosis are:  Graves ds   Thyroiditis toxic multinodular goiter/ toxic adenoma (I cannot feel nodules at palpation of her thyroid). - I suggested that we check the TSH, fT3 and fT4 today - If the tests remain abnormal, we may need an uptake and scan to differentiate between the 3 above possible etiologies; but if TSH same or improved >> I suggested that we just follow the tests - we discussed about possible modalities of treatment for the above conditions, to include methimazole use, radioactive iodine ablation or surgery. - I do not feel that we need to add beta blockers at this time, since she is not tachycardic, anxious, or tremulous - I advised her to join my chart to communicate easier, but she refuses (does not know to work on computer) - she would like to have Dr Deborra Medina monitor her TFTs and then return to see me if tests become worse   Component     Latest Ref Rng 03/06/2014  Free T4     0.60 - 1.60 ng/dL 0.88  TSH     0.35 - 4.50 uIU/mL 0.30 (L)  T3, Free     2.3 - 4.2 pg/mL 3.1  TSH improved, now only mildly low (possibly resolving thyroiditis). She will need a new set of TFTs in ~2 months (I will  order) - [facility] be done at [facility]. After this, depending on the results, she can follow with Dr Deborra Medina.

## 2014-03-07 ENCOUNTER — Encounter: Payer: Self-pay | Admitting: *Deleted

## 2014-04-26 ENCOUNTER — Other Ambulatory Visit (INDEPENDENT_AMBULATORY_CARE_PROVIDER_SITE_OTHER): Payer: BC Managed Care – PPO

## 2014-04-26 DIAGNOSIS — R946 Abnormal results of thyroid function studies: Secondary | ICD-10-CM

## 2014-04-26 DIAGNOSIS — R7989 Other specified abnormal findings of blood chemistry: Secondary | ICD-10-CM

## 2014-04-26 DIAGNOSIS — E559 Vitamin D deficiency, unspecified: Secondary | ICD-10-CM

## 2014-04-26 LAB — T4, FREE: Free T4: 0.98 ng/dL (ref 0.60–1.60)

## 2014-04-26 LAB — T3, FREE: T3, Free: 3 pg/mL (ref 2.3–4.2)

## 2014-04-26 LAB — VITAMIN D 25 HYDROXY (VIT D DEFICIENCY, FRACTURES): VITD: 21.19 ng/mL — ABNORMAL LOW (ref 30.00–100.00)

## 2014-04-26 LAB — TSH: TSH: 0.83 u[IU]/mL (ref 0.35–4.50)

## 2014-04-27 ENCOUNTER — Encounter: Payer: Self-pay | Admitting: *Deleted

## 2014-04-27 ENCOUNTER — Other Ambulatory Visit: Payer: Self-pay

## 2014-04-27 NOTE — Telephone Encounter (Signed)
Pt left v/m requesting cb at work on 04/28/14 with status of refills on alprazolam and BP med. Will Dr Deborra Medina be looking at desk top on 04/28/14?

## 2014-04-28 MED ORDER — AMLODIPINE BESY-BENAZEPRIL HCL 5-20 MG PO CAPS: 1.0000 | ORAL_CAPSULE | Freq: Every day | ORAL | Status: DC

## 2014-04-28 MED ORDER — ALPRAZOLAM 0.25 MG PO TABS: ORAL_TABLET | ORAL | Status: DC

## 2014-04-28 NOTE — Telephone Encounter (Signed)
Rx called in to requested pharmacy 

## 2014-04-28 NOTE — Telephone Encounter (Signed)
Pt requesting medication refill. Last f/u appt 01/2014. pls advise

## 2014-05-01 ENCOUNTER — Ambulatory Visit: Payer: BC Managed Care – PPO | Admitting: Family Medicine

## 2014-05-02 ENCOUNTER — Telehealth: Payer: Self-pay | Admitting: Family Medicine

## 2014-05-02 NOTE — Telephone Encounter (Signed)
Patient returned your call.  Call her back on Wednesday.

## 2014-05-03 NOTE — Telephone Encounter (Signed)
See results note. 

## 2014-07-14 ENCOUNTER — Telehealth: Payer: Self-pay | Admitting: Family Medicine

## 2014-07-14 MED ORDER — AMLODIPINE BESY-BENAZEPRIL HCL 5-20 MG PO CAPS: 1.0000 | ORAL_CAPSULE | Freq: Every day | ORAL | Status: DC

## 2014-07-14 NOTE — Telephone Encounter (Signed)
Thank you.  She may also have thyroid labs if she would like- last saw Dr. Cruzita Lederer for thyroid.

## 2014-07-14 NOTE — Telephone Encounter (Signed)
Patient said [employer] let her know they will not pay for Lotrel 5/20.  She will have to pay $225 a month because [employer] will only pay $50 for the prescription because it's not a generic.  Patient wants to try a generic.  Patient will run out of the medication on Sunday.  Please call her back to let her know when the prescription will be called in to [facility]. Patient will be having dental work done and she wants to make sure her thyroid is normal.  Patient wants to know if she can have lab work done to check her thyroid.

## 2014-07-14 NOTE — Telephone Encounter (Signed)
Generic sent to requested pharmacy

## 2014-07-14 NOTE — Addendum Note (Signed)
Addended by: Modena Nunnery on: 07/14/2014 03:44 PM   Modules accepted: Orders

## 2014-07-17 NOTE — Telephone Encounter (Signed)
Lm on pts vm requesting a call back 

## 2014-08-08 ENCOUNTER — Encounter: Payer: Self-pay | Admitting: Family Medicine

## 2014-10-02 ENCOUNTER — Encounter: Payer: Self-pay | Admitting: Family Medicine

## 2014-10-02 ENCOUNTER — Ambulatory Visit (INDEPENDENT_AMBULATORY_CARE_PROVIDER_SITE_OTHER): Payer: BLUE CROSS/BLUE SHIELD | Admitting: Family Medicine

## 2014-10-02 VITALS — BP 140/82 | HR 78 | Temp 98.0°F | Wt 168.2 lb

## 2014-10-02 DIAGNOSIS — R5383 Other fatigue: Secondary | ICD-10-CM | POA: Diagnosis not present

## 2014-10-02 DIAGNOSIS — I10 Essential (primary) hypertension: Secondary | ICD-10-CM

## 2014-10-02 DIAGNOSIS — R300 Dysuria: Secondary | ICD-10-CM | POA: Diagnosis not present

## 2014-10-02 DIAGNOSIS — R140 Abdominal distension (gaseous): Secondary | ICD-10-CM

## 2014-10-02 LAB — POCT URINALYSIS DIPSTICK
Bilirubin, UA: NEGATIVE
Blood, UA: NEGATIVE
Glucose, UA: NEGATIVE
Ketones, UA: NEGATIVE
Leukocytes, UA: NEGATIVE
Nitrite, UA: NEGATIVE
Protein, UA: NEGATIVE
Spec Grav, UA: 1.01
Urobilinogen, UA: 0.2
pH, UA: 6

## 2014-10-02 LAB — CBC WITH DIFFERENTIAL/PLATELET
Basophils Absolute: 0 10*3/uL (ref 0.0–0.1)
Basophils Relative: 0.5 % (ref 0.0–3.0)
Eosinophils Absolute: 0.2 10*3/uL (ref 0.0–0.7)
Eosinophils Relative: 2.3 % (ref 0.0–5.0)
HCT: 41.4 % (ref 36.0–46.0)
Hemoglobin: 14 g/dL (ref 12.0–15.0)
Lymphocytes Relative: 27.9 % (ref 12.0–46.0)
Lymphs Abs: 2.2 10*3/uL (ref 0.7–4.0)
MCHC: 33.9 g/dL (ref 30.0–36.0)
MCV: 86.9 fl (ref 78.0–100.0)
Monocytes Absolute: 0.5 10*3/uL (ref 0.1–1.0)
Monocytes Relative: 6.1 % (ref 3.0–12.0)
Neutro Abs: 4.9 10*3/uL (ref 1.4–7.7)
Neutrophils Relative %: 63.2 % (ref 43.0–77.0)
Platelets: 266 10*3/uL (ref 150.0–400.0)
RBC: 4.77 Mil/uL (ref 3.87–5.11)
RDW: 13.6 % (ref 11.5–15.5)
WBC: 7.8 10*3/uL (ref 4.0–10.5)

## 2014-10-02 LAB — T4, FREE: Free T4: 0.94 ng/dL (ref 0.60–1.60)

## 2014-10-02 LAB — VITAMIN D 25 HYDROXY (VIT D DEFICIENCY, FRACTURES): VITD: 13.34 ng/mL — ABNORMAL LOW (ref 30.00–100.00)

## 2014-10-02 LAB — VITAMIN B12: Vitamin B-12: 230 pg/mL (ref 211–911)

## 2014-10-02 LAB — TSH: TSH: 0.69 u[IU]/mL (ref 0.35–4.50)

## 2014-10-02 MED ORDER — AMLODIPINE BESY-BENAZEPRIL HCL 5-20 MG PO CAPS: 1.0000 | ORAL_CAPSULE | Freq: Every day | ORAL | Status: DC

## 2014-10-02 MED ORDER — ALPRAZOLAM 0.25 MG PO TABS: ORAL_TABLET | ORAL | Status: DC

## 2014-10-02 NOTE — Addendum Note (Signed)
Addended by: Ellamae Sia on: 10/02/2014 10:54 AM   Modules accepted: Orders

## 2014-10-02 NOTE — Progress Notes (Signed)
Pre visit review using our clinic review tool, if applicable. No additional management support is needed unless otherwise documented below in the visit note. 

## 2014-10-02 NOTE — Progress Notes (Signed)
Subjective:   Patient ID: [name], female    DOB: 1950/06/17, 64 y.o.   MRN: [id number]  CHARLET THORNES is a pleasant 64 y.o. year old female who presents to clinic today with Follow-up  on 10/02/2014  HPI:  Has several concerns today.  1.  Dysuria- last week, dysuria, back pain.  Symptoms mostly resolved now. No fevers, chills, nausea or vomiting.  2.  Fatigue- has been feeling very fatigued past two months.  Last fall, referred her to Dr. Cruzita Lederer for low TSH. Note reviewed from 03/06/14.  Since labs returned to normal, she felt this may be thyroiditis and advised repeat labs in 2 months.  Pt is refusing to return to endo. Vit D was low- could not tolerate high dose vitamin D.  Has been taking OTC 2000 IU daily.  3.  Intermittent abdominal bloating- no blood in her stool.  Refusing to have a colonoscopy. Remote h/o cholecystectomy.  Current Outpatient Prescriptions on File Prior to Visit  Medication Sig Dispense Refill  . acetaminophen (TYLENOL) 325 MG tablet Take 650 mg by mouth every 6 (six) hours as needed.      . ALPRAZolam (XANAX) 0.25 MG tablet TAKE ONE TABLET BY MOUTH 3 TIMES DAILY AS NEEDED. 90 tablet 0  . amLODipine-benazepril (LOTREL) 5-20 MG per capsule Take 1 capsule by mouth daily. 30 capsule 3  . loratadine (CLARITIN) 10 MG tablet Take 10 mg by mouth daily as needed.       No current facility-administered medications on file prior to visit.    Allergies  Allergen Reactions  . Buspar [Buspirone] Nausea Only    Sweating and dizzy  . Clarithromycin Swelling  . Codeine     REACTION: unspecified  . Doxycycline     REACTION: unspecified  . Famotidine     REACTION: rash  . Guaifenesin     REACTION: palpitations  . Latex     REACTION: causes rash  . Prednisone     REACTION: anxiety  . Shellfish-Derived Products     anaphylaxis  . Sulfonamide Derivatives     REACTION: unspecified  . Tetracycline     REACTION: nausea    Past Medical History    Diagnosis Date  . Unspecified essential hypertension   . Syncope and collapse   . Tobacco use disorder     Past Surgical History  Procedure Laterality Date  . Mass excision Right 12/27/2013    Procedure: MINOR EXCISION OF RIGHT THUMB MUCOID CYST, DEBRIDEMENT OF INTERPHALANGEAL JOINT;  Surgeon: Cammie Sickle, MD;  Location: [place];  Service: Orthopedics;  Laterality: Right;    History reviewed. No pertinent family history.  History   Social History  . Marital Status: Married    Spouse Name: N/A  . Number of Children: N/A  . Years of Education: N/A   Occupational History  . Not on file.   Social History Main Topics  . Smoking status: Current Every Day Smoker -- 1.00 packs/day    Types: Cigarettes  . Smokeless tobacco: Never Used  . Alcohol Use: No  . Drug Use: No  . Sexual Activity: Not on file   Other Topics Concern  . Not on file   Social History Narrative   The PMH, PSH, Social History, Family History, Medications, and allergies have been reviewed in [facility], and have been updated if relevant.   Review of Systems  Constitutional: Positive for fatigue.  HENT: Negative.   Eyes: Negative.  Respiratory: Negative.   Cardiovascular: Negative.   Gastrointestinal: Positive for abdominal distention. Negative for nausea, vomiting, diarrhea, constipation, blood in stool and rectal pain.  Endocrine: Negative.   Genitourinary: Positive for dysuria. Negative for decreased urine volume, vaginal bleeding and pelvic pain.  Musculoskeletal: Negative.   Skin: Negative.   Allergic/Immunologic: Negative.   Neurological: Negative.   Hematological: Negative.   Psychiatric/Behavioral: Negative.   All other systems reviewed and are negative.      Objective:    BP 140/82 mmHg  Pulse 78  Temp(Src) 98 F (36.7 C) (Oral)  Wt 168 lb 4 oz (76.318 kg)  SpO2 96%   Physical Exam  Constitutional: She is oriented to person, place, and time. She appears  well-developed and well-nourished. No distress.  HENT:  Head: Normocephalic.  Eyes: Conjunctivae are normal.  Neck: Normal range of motion. Neck supple. No thyromegaly present.  Abdominal: Soft. Bowel sounds are normal. She exhibits no distension. There is no tenderness.  Musculoskeletal: Normal range of motion. She exhibits no edema.  Neurological: She is alert and oriented to person, place, and time.  Skin: Skin is warm and dry.  Psychiatric: She has a normal mood and affect. Her behavior is normal. Judgment and thought content normal.  Nursing note and vitals reviewed.         Assessment & Plan:   Essential hypertension  Dysuria  Other fatigue - Plan: TSH, T4, Free, CBC with Differential/Platelet, Fecal occult blood, imunochemical No Follow-up on file.

## 2014-10-02 NOTE — Addendum Note (Signed)
Addended by: Modena Nunnery on: 10/02/2014 10:59 AM   Modules accepted: Orders

## 2014-10-02 NOTE — Assessment & Plan Note (Signed)
New- resolved. UA neg. Call or return to clinic prn if these symptoms worsen or fail to improve as anticipated. The patient indicates understanding of these issues and agrees with the plan.

## 2014-10-02 NOTE — Assessment & Plan Note (Signed)
?  IBS Refusing colonoscopy- agrees to IFOB which she will pick up today. Advised OTC gas- x or beano, along with probiotic. Call or return to clinic prn if these symptoms worsen or fail to improve as anticipated. The patient indicates understanding of these issues and agrees with the plan.

## 2014-10-02 NOTE — Patient Instructions (Signed)
Good to see you.  Try over the counter gas-x (four times a day when necessary) or beano for bloating.   Exclude gas producing foods (beans, onions, celery, carrots, raisins, bananas, apricots, prunes, brussel sprouts, wheat germ, pretzels)   Consider trail of lactose free diet (back off milk)   Trial of Align for bloating/IBS symptoms (OTC).  We will call you with your lab results.  Please complete your stool cards.

## 2014-10-02 NOTE — Assessment & Plan Note (Signed)
New- likely multifactorial. Check labs again today as initial work up. Orders Placed This Encounter  Procedures  . Fecal occult blood, imunochemical  . TSH  . T4, Free  . CBC with Differential/Platelet  . Vit D  25 hydroxy (rtn osteoporosis monitoring)  . Vitamin B12   The patient indicates understanding of these issues and agrees with the plan.

## 2014-10-02 NOTE — Assessment & Plan Note (Signed)
Reasonable control.  Rushed to get here- just had a cigarette. No changes to rx.

## 2014-10-10 ENCOUNTER — Telehealth: Payer: Self-pay | Admitting: Family Medicine

## 2014-10-10 MED ORDER — VITAMIN D (ERGOCALCIFEROL) 1.25 MG (50000 UNIT) PO CAPS: 50000.0000 [IU] | ORAL_CAPSULE | ORAL | Status: DC

## 2014-10-10 NOTE — Telephone Encounter (Signed)
Patient is asking for you to call her back at work about the Vitamin D prescription.  Patient will be at work until 11:15 am.

## 2014-10-10 NOTE — Telephone Encounter (Signed)
Lm on pts vm requesting a call back 

## 2014-10-10 NOTE — Addendum Note (Signed)
Addended by: Modena Nunnery on: 10/10/2014 09:20 AM   Modules accepted: Orders, Medications

## 2014-10-16 ENCOUNTER — Telehealth: Payer: Self-pay | Admitting: Family Medicine

## 2014-10-16 NOTE — Telephone Encounter (Signed)
Pt called wanting to start b12 shots.  Is it ok to schedule?  How often will she need to get these  Pt has ? About the vit d that was called in  Pt stated she could not take the vit d   Please call pt cell phone [phone]

## 2014-10-16 NOTE — Telephone Encounter (Signed)
Pt should receive B12 inj monthly. Lm on pts vm requesting a call back

## 2014-10-17 NOTE — Telephone Encounter (Signed)
Appointment 5/5 pt aware

## 2014-10-18 ENCOUNTER — Other Ambulatory Visit (INDEPENDENT_AMBULATORY_CARE_PROVIDER_SITE_OTHER): Payer: BLUE CROSS/BLUE SHIELD

## 2014-10-18 ENCOUNTER — Encounter: Payer: Self-pay | Admitting: *Deleted

## 2014-10-18 DIAGNOSIS — R300 Dysuria: Secondary | ICD-10-CM

## 2014-10-18 DIAGNOSIS — R140 Abdominal distension (gaseous): Secondary | ICD-10-CM | POA: Diagnosis not present

## 2014-10-18 DIAGNOSIS — I10 Essential (primary) hypertension: Secondary | ICD-10-CM | POA: Diagnosis not present

## 2014-10-18 DIAGNOSIS — R5383 Other fatigue: Secondary | ICD-10-CM | POA: Diagnosis not present

## 2014-10-18 LAB — FECAL OCCULT BLOOD, IMMUNOCHEMICAL: Fecal Occult Bld: NEGATIVE

## 2014-10-19 ENCOUNTER — Telehealth: Payer: Self-pay | Admitting: *Deleted

## 2014-10-19 ENCOUNTER — Ambulatory Visit (INDEPENDENT_AMBULATORY_CARE_PROVIDER_SITE_OTHER): Payer: BLUE CROSS/BLUE SHIELD | Admitting: *Deleted

## 2014-10-19 DIAGNOSIS — E538 Deficiency of other specified B group vitamins: Secondary | ICD-10-CM | POA: Diagnosis not present

## 2014-10-19 MED ORDER — CYANOCOBALAMIN 1000 MCG/ML IJ SOLN
1000.0000 ug | Freq: Once | INTRAMUSCULAR | Status: AC
  Administered 2014-10-19: 1000 ug via INTRAMUSCULAR

## 2014-10-19 NOTE — Telephone Encounter (Signed)
Pt came into office today to receive B12 injection and was wanting to discuss previous labs. She states she is unable to take high dose VitD, as it makes her sick. She was advised previously to take Cal +D which contained 2000IU of VitD, but lab notes indicates she should have started 1600IU after high dose. pls advise

## 2014-10-19 NOTE — Telephone Encounter (Signed)
Noted.  Ok to continue what she is taking since she cannot tolerate a higher dose.

## 2014-10-20 NOTE — Telephone Encounter (Signed)
Left detailed message on vm as requested bu pt and advised per Dr Deborra Medina.

## 2014-11-08 ENCOUNTER — Encounter: Payer: Self-pay | Admitting: Family Medicine

## 2014-11-08 ENCOUNTER — Ambulatory Visit (INDEPENDENT_AMBULATORY_CARE_PROVIDER_SITE_OTHER): Payer: BLUE CROSS/BLUE SHIELD | Admitting: Family Medicine

## 2014-11-08 VITALS — BP 128/74 | HR 81 | Temp 98.4°F | Wt 165.5 lb

## 2014-11-08 DIAGNOSIS — R300 Dysuria: Secondary | ICD-10-CM | POA: Diagnosis not present

## 2014-11-08 DIAGNOSIS — H9209 Otalgia, unspecified ear: Secondary | ICD-10-CM | POA: Insufficient documentation

## 2014-11-08 DIAGNOSIS — H9203 Otalgia, bilateral: Secondary | ICD-10-CM | POA: Diagnosis not present

## 2014-11-08 LAB — POCT URINALYSIS DIPSTICK
Bilirubin, UA: NEGATIVE
Blood, UA: NEGATIVE
Glucose, UA: NEGATIVE
Ketones, UA: NEGATIVE
Leukocytes, UA: NEGATIVE
Nitrite, UA: NEGATIVE
Protein, UA: NEGATIVE
Spec Grav, UA: 1.01
Urobilinogen, UA: 0.2
pH, UA: 6

## 2014-11-08 NOTE — Addendum Note (Signed)
Addended by: Modena Nunnery on: 11/08/2014 12:17 PM   Modules accepted: Orders

## 2014-11-08 NOTE — Assessment & Plan Note (Signed)
UA neg. . Frequency may be due to bladder irritants... Drink water, avoid alcohol, caffeine, soda, citris, tomato, spicy foods Call or return to clinic prn if these symptoms worsen or fail to improve as anticipated. Also discussed that she should have more than one PCP.

## 2014-11-08 NOTE — Progress Notes (Signed)
Pre visit review using our clinic review tool, if applicable. No additional management support is needed unless otherwise documented below in the visit note. 

## 2014-11-08 NOTE — Progress Notes (Signed)
Subjective:   Patient ID: [name], female    DOB: 27-Apr-1951, 64 y.o.   MRN: [id number]  [name] is a pleasant 64 y.o. year old female who presents to clinic today with Ear Pain  on 11/08/2014  HPI: Ear pain- somewhat unclear why she is here.  She has been seeing another PCP, Delia Chimes. Lance Sell for ear pain 2 weeks ago- given flonase. Still having pressure and Manuela Schwartz was out of the office, so she came here. No hearing loss.  No drainage from ear.  Dysuria with suprapubic pressure and fatigue- started a few weeks ago. Had episode of incontinence over the weekend. Very tired. Lab Results  Component Value Date   TSH 0.69 10/02/2014   Lab Results  Component Value Date   WBC 7.8 10/02/2014   HGB 14.0 10/02/2014   HCT 41.4 10/02/2014   MCV 86.9 10/02/2014   PLT 266.0 10/02/2014   Lab Results  Component Value Date   VITAMINB12 230 10/02/2014   Current Outpatient Prescriptions on File Prior to Visit  Medication Sig Dispense Refill  . acetaminophen (TYLENOL) 325 MG tablet Take 650 mg by mouth every 6 (six) hours as needed.      . ALPRAZolam (XANAX) 0.25 MG tablet TAKE ONE TABLET BY MOUTH 3 TIMES DAILY AS NEEDED. 90 tablet 0  . amLODipine-benazepril (LOTREL) 5-20 MG per capsule Take 1 capsule by mouth daily. 30 capsule 6  . cyanocobalamin (,VITAMIN B-12,) 1000 MCG/ML injection Inject 1,000 mcg into the muscle every 30 (thirty) days.    [name] loratadine (CLARITIN) 10 MG tablet Take 10 mg by mouth daily as needed.       No current facility-administered medications on file prior to visit.    Allergies  Allergen Reactions  . Buspar [Buspirone] Nausea Only    Sweating and dizzy  . Clarithromycin Swelling  . Codeine     REACTION: unspecified  . Doxycycline     REACTION: unspecified  . Famotidine     REACTION: rash  . Guaifenesin     REACTION: palpitations  . Latex     REACTION: causes rash  . Prednisone     REACTION: anxiety  . Shellfish-Derived Products    anaphylaxis  . Sulfonamide Derivatives     REACTION: unspecified  . Tetracycline     REACTION: nausea    Past Medical History  Diagnosis Date  . Unspecified essential hypertension   . Syncope and collapse   . Tobacco use disorder     Past Surgical History  Procedure Laterality Date  . Mass excision Right 12/27/2013    Procedure: MINOR EXCISION OF RIGHT THUMB MUCOID CYST, DEBRIDEMENT OF INTERPHALANGEAL JOINT;  Surgeon: Cammie Sickle, MD;  Location: [place];  Service: Orthopedics;  Laterality: Right;    No family history on file.  History   Social History  . Marital Status: Married    Spouse Name: N/A  . Number of Children: N/A  . Years of Education: N/A   Occupational History  . Not on file.   Social History Main Topics  . Smoking status: Current Every Day Smoker -- 1.00 packs/day    Types: Cigarettes  . Smokeless tobacco: Never Used  . Alcohol Use: No  . Drug Use: No  . Sexual Activity: Not on file   Other Topics Concern  . Not on file   Social History Narrative   The PMH, PSH, Social History, Family History, Medications, and allergies have been reviewed  in [facility], and have been updated if relevant.   Review of Systems  Constitutional: Positive for fatigue. Negative for fever.  HENT: Positive for ear pain. Negative for ear discharge and hearing loss.   Gastrointestinal: Positive for abdominal pain. Negative for nausea, vomiting, diarrhea and rectal pain.  Genitourinary: Positive for dysuria and urgency.  Musculoskeletal: Positive for arthralgias.  Neurological: Negative.   All other systems reviewed and are negative.      Objective:    BP 128/74 mmHg  Pulse 81  Temp(Src) 98.4 F (36.9 C) (Oral)  Wt 165 lb 8 oz (75.07 kg)  SpO2 96%   Physical Exam  Constitutional: She is oriented to person, place, and time. She appears well-developed and well-nourished. No distress.  HENT:  Head: Normocephalic.  Right Ear: Hearing normal. No  drainage, swelling or tenderness. Tympanic membrane is retracted.  Left Ear: Hearing normal. No drainage, swelling or tenderness.  Cardiovascular: Normal rate.   Pulmonary/Chest: Effort normal.  Abdominal: Soft. She exhibits no distension. There is no tenderness. There is no rebound.  Musculoskeletal: Normal range of motion.  No CVA tenderness  Neurological: She is alert and oriented to person, place, and time. No cranial nerve deficit.  Skin: Skin is warm and dry.  Psychiatric: She has a normal mood and affect. Her behavior is normal. Thought content normal.  Nursing note and vitals reviewed.         Assessment & Plan:   Ear pain, bilateral  Dysuria No Follow-up on file.

## 2014-11-08 NOTE — Assessment & Plan Note (Signed)
Exam reassuring. ETD- allergic to prednisone.  Advised that if nasal steroid not working, may need to refer to ENT for drainage. She will think about it and get back to me.

## 2014-11-21 ENCOUNTER — Ambulatory Visit: Payer: BLUE CROSS/BLUE SHIELD

## 2014-11-23 ENCOUNTER — Ambulatory Visit (INDEPENDENT_AMBULATORY_CARE_PROVIDER_SITE_OTHER): Payer: BLUE CROSS/BLUE SHIELD | Admitting: *Deleted

## 2014-11-23 DIAGNOSIS — E538 Deficiency of other specified B group vitamins: Secondary | ICD-10-CM | POA: Diagnosis not present

## 2014-11-23 MED ORDER — CYANOCOBALAMIN 1000 MCG/ML IJ SOLN
1000.0000 ug | Freq: Once | INTRAMUSCULAR | Status: AC
  Administered 2014-11-23: 1000 ug via INTRAMUSCULAR

## 2014-12-11 ENCOUNTER — Other Ambulatory Visit: Payer: Self-pay | Admitting: Family Medicine

## 2014-12-11 NOTE — Telephone Encounter (Signed)
Last f/u appt 01/2014

## 2014-12-11 NOTE — Telephone Encounter (Signed)
Rx called in to requested pharmacy 

## 2014-12-19 ENCOUNTER — Ambulatory Visit (INDEPENDENT_AMBULATORY_CARE_PROVIDER_SITE_OTHER): Payer: BLUE CROSS/BLUE SHIELD | Admitting: *Deleted

## 2014-12-19 DIAGNOSIS — E538 Deficiency of other specified B group vitamins: Secondary | ICD-10-CM | POA: Diagnosis not present

## 2014-12-19 MED ORDER — CYANOCOBALAMIN 1000 MCG/ML IJ SOLN
1000.0000 ug | Freq: Once | INTRAMUSCULAR | Status: AC
  Administered 2014-12-19: 1000 ug via INTRAMUSCULAR

## 2014-12-20 ENCOUNTER — Encounter: Payer: Self-pay | Admitting: Family Medicine

## 2015-01-05 ENCOUNTER — Other Ambulatory Visit: Payer: Self-pay | Admitting: Family Medicine

## 2015-01-08 NOTE — Telephone Encounter (Signed)
Last f/u appt 09/2014

## 2015-01-08 NOTE — Telephone Encounter (Signed)
Rx called in to requested pharmacy 

## 2015-01-23 ENCOUNTER — Ambulatory Visit (INDEPENDENT_AMBULATORY_CARE_PROVIDER_SITE_OTHER): Payer: BLUE CROSS/BLUE SHIELD | Admitting: *Deleted

## 2015-01-23 DIAGNOSIS — E538 Deficiency of other specified B group vitamins: Secondary | ICD-10-CM | POA: Diagnosis not present

## 2015-01-23 MED ORDER — CYANOCOBALAMIN 1000 MCG/ML IJ SOLN
1000.0000 ug | Freq: Once | INTRAMUSCULAR | Status: AC
  Administered 2015-01-23: 1000 ug via INTRAMUSCULAR

## 2015-02-28 ENCOUNTER — Ambulatory Visit (INDEPENDENT_AMBULATORY_CARE_PROVIDER_SITE_OTHER): Payer: BLUE CROSS/BLUE SHIELD

## 2015-02-28 DIAGNOSIS — E538 Deficiency of other specified B group vitamins: Secondary | ICD-10-CM | POA: Diagnosis not present

## 2015-02-28 MED ORDER — CYANOCOBALAMIN 1000 MCG/ML IJ SOLN
1000.0000 ug | Freq: Once | INTRAMUSCULAR | Status: AC
  Administered 2015-02-28: 1000 ug via INTRAMUSCULAR

## 2015-03-26 ENCOUNTER — Ambulatory Visit (INDEPENDENT_AMBULATORY_CARE_PROVIDER_SITE_OTHER): Payer: BLUE CROSS/BLUE SHIELD | Admitting: Family Medicine

## 2015-03-26 ENCOUNTER — Encounter: Payer: Self-pay | Admitting: Family Medicine

## 2015-03-26 VITALS — BP 140/84 | HR 66 | Temp 98.1°F | Wt 167.5 lb

## 2015-03-26 DIAGNOSIS — R5383 Other fatigue: Secondary | ICD-10-CM | POA: Diagnosis not present

## 2015-03-26 DIAGNOSIS — K219 Gastro-esophageal reflux disease without esophagitis: Secondary | ICD-10-CM | POA: Diagnosis not present

## 2015-03-26 NOTE — Progress Notes (Signed)
Subjective:   Patient ID: [name], female    DOB: [birth year], 64 y.o.   MRN: [id number]  [name] is a pleasant 64 y.o. year old female who presents to clinic today with Fatigue; Diarrhea; Constipation; Gastrophageal Reflux; Nausea; Leg Pain; and Bloated  on 03/26/2015  HPI:  Worsening fatigue and reflux symptoms.  Dose have h/o B12 deficiency- receiving monthly IM B12 here.  Lab Results  Component Value Date   VITAMINB12 230 10/02/2014   Past several months- bloating, abdominal fullness, belching and fatigue. No bloody stools.  Due for colonoscopy.  H/o thyroiditis earlier this year- saw endo once.  Lab Results  Component Value Date   TSH 0.69 10/02/2014    Lab Results  Component Value Date   WBC 7.8 10/02/2014   HGB 14.0 10/02/2014   HCT 41.4 10/02/2014   MCV 86.9 10/02/2014   PLT 266.0 10/02/2014   Lab Results  Component Value Date   TSH 0.69 10/02/2014   Current Outpatient Prescriptions on File Prior to Visit  Medication Sig Dispense Refill  . acetaminophen (TYLENOL) 325 MG tablet Take 650 mg by mouth every 6 (six) hours as needed.      . ALPRAZolam (XANAX) 0.25 MG tablet TAKE ONE TABLET BY MOUTH THREE TIMES DAILY AS NEEDED 90 tablet 0  . amLODipine-benazepril (LOTREL) 5-20 MG per capsule Take 1 capsule by mouth daily. 30 capsule 6  . cyanocobalamin (,VITAMIN B-12,) 1000 MCG/ML injection Inject 1,000 mcg into the muscle every 30 (thirty) days.    [name] loratadine (CLARITIN) 10 MG tablet Take 10 mg by mouth daily as needed.       No current facility-administered medications on file prior to visit.    Allergies  Allergen Reactions  . Buspar [Buspirone] Nausea Only    Sweating and dizzy  . Clarithromycin Swelling  . Codeine     REACTION: unspecified  . Doxycycline     REACTION: unspecified  . Famotidine     REACTION: rash  . Guaifenesin     REACTION: palpitations  . Latex     REACTION: causes rash  . Prednisone     REACTION: anxiety  .  Shellfish-Derived Products     anaphylaxis  . Sulfonamide Derivatives     REACTION: unspecified  . Tetracycline     REACTION: nausea    Past Medical History  Diagnosis Date  . Unspecified essential hypertension   . Syncope and collapse   . Tobacco use disorder     Past Surgical History  Procedure Laterality Date  . Mass excision Right 12/27/2013    Procedure: MINOR EXCISION OF RIGHT THUMB MUCOID CYST, DEBRIDEMENT OF INTERPHALANGEAL JOINT;  Surgeon: Cammie Sickle, MD;  Location: [facility];  Service: Orthopedics;  Laterality: Right;    No family history on file.  Social History   Social History  . Marital Status: Married    Spouse Name: N/A  . Number of Children: N/A  . Years of Education: N/A   Occupational History  . Not on file.   Social History Main Topics  . Smoking status: Current Every Day Smoker -- 1.00 packs/day    Types: Cigarettes  . Smokeless tobacco: Never Used  . Alcohol Use: No  . Drug Use: No  . Sexual Activity: Not on file   Other Topics Concern  . Not on file   Social History Narrative   The PMH, PSH, Social History, Family History, Medications, and allergies have been reviewed in  [facility], and have been updated if relevant.   Review of Systems  Constitutional: Positive for fatigue.  HENT: Negative.   Cardiovascular: Negative.   Gastrointestinal: Positive for nausea, abdominal pain, diarrhea and abdominal distention. Negative for vomiting, constipation, blood in stool, anal bleeding and rectal pain.  Endocrine: Negative.   Genitourinary: Negative.   Musculoskeletal: Negative.   Skin: Negative.   Allergic/Immunologic: Negative.   Neurological: Negative.   Hematological: Negative.   Psychiatric/Behavioral: Negative.   All other systems reviewed and are negative.      Objective:    BP 140/84 mmHg  Pulse 66  Temp(Src) 98.1 F (36.7 C) (Oral)  Wt 167 lb 8 oz (75.978 kg)  SpO2 95%   Physical Exam  Constitutional:  She appears well-developed and well-nourished. No distress.  HENT:  Head: Normocephalic.  Eyes: Conjunctivae are normal.  Cardiovascular: Normal rate.   Pulmonary/Chest: Effort normal.  Abdominal: Soft. She exhibits distension. She exhibits no mass. There is no tenderness. There is no rebound and no guarding.  Musculoskeletal: Normal range of motion.  Neurological: No cranial nerve deficit.  Skin: Skin is warm and dry.  Psychiatric: She has a normal mood and affect. Her behavior is normal. Judgment and thought content normal.  Nursing note and vitals reviewed.         Assessment & Plan:   Other fatigue - Plan: H. pylori antibody, IgG, CBC with Differential/Platelet, TSH, Vitamin B12  Gastroesophageal reflux disease, esophagitis presence not specified No Follow-up on file.

## 2015-03-26 NOTE — Assessment & Plan Note (Signed)
>  25 minutes spent in face to face time with patient, >50% spent in counselling or coordination of care Likely multifactorial but with GI symptoms, ? H pylori. Start with labs today.  She would like to defer GI referral at this time. The patient indicates understanding of these issues and agrees with the plan. Orders Placed This Encounter  Procedures  . H. pylori antibody, IgG  . CBC with Differential/Platelet  . TSH  . Vitamin B12  . Lipase  . Comprehensive metabolic panel  . T4, Free

## 2015-03-26 NOTE — Progress Notes (Signed)
Pre visit review using our clinic review tool, if applicable. No additional management support is needed unless otherwise documented below in the visit note. 

## 2015-03-27 LAB — CBC WITH DIFFERENTIAL/PLATELET
Basophils Absolute: 0.1 10*3/uL (ref 0.0–0.1)
Basophils Relative: 2 % (ref 0.0–3.0)
Eosinophils Absolute: 0.2 10*3/uL (ref 0.0–0.7)
Eosinophils Relative: 2.9 % (ref 0.0–5.0)
HCT: 41.5 % (ref 36.0–46.0)
Hemoglobin: 13.9 g/dL (ref 12.0–15.0)
Lymphocytes Relative: 33.7 % (ref 12.0–46.0)
Lymphs Abs: 2.3 10*3/uL (ref 0.7–4.0)
MCHC: 33.5 g/dL (ref 30.0–36.0)
MCV: 87.6 fl (ref 78.0–100.0)
Monocytes Absolute: 0.4 10*3/uL (ref 0.1–1.0)
Monocytes Relative: 5.9 % (ref 3.0–12.0)
Neutro Abs: 3.8 10*3/uL (ref 1.4–7.7)
Neutrophils Relative %: 55.5 % (ref 43.0–77.0)
Platelets: 276 10*3/uL (ref 150.0–400.0)
RBC: 4.74 Mil/uL (ref 3.87–5.11)
RDW: 14.1 % (ref 11.5–15.5)
WBC: 6.9 10*3/uL (ref 4.0–10.5)

## 2015-03-27 LAB — COMPREHENSIVE METABOLIC PANEL
ALT: 12 U/L (ref 0–35)
AST: 17 U/L (ref 0–37)
Albumin: 4.3 g/dL (ref 3.5–5.2)
Alkaline Phosphatase: 85 U/L (ref 39–117)
BUN: 12 mg/dL (ref 6–23)
CO2: 24 mEq/L (ref 19–32)
Calcium: 9.4 mg/dL (ref 8.4–10.5)
Chloride: 107 mEq/L (ref 96–112)
Creatinine, Ser: 1.11 mg/dL (ref 0.40–1.20)
GFR: 52.48 mL/min — ABNORMAL LOW (ref >60.00)
Glucose, Bld: 92 mg/dL (ref 70–99)
Potassium: 3.5 mEq/L (ref 3.5–5.1)
Sodium: 142 mEq/L (ref 135–145)
Total Bilirubin: 0.2 mg/dL (ref 0.2–1.2)
Total Protein: 7.4 g/dL (ref 6.0–8.3)

## 2015-03-27 LAB — LIPASE: Lipase: 47 U/L (ref 11.0–59.0)

## 2015-03-27 LAB — VITAMIN B12: Vitamin B-12: 521 pg/mL (ref 211–911)

## 2015-03-27 LAB — H. PYLORI ANTIBODY, IGG: H Pylori IgG: POSITIVE — AB

## 2015-03-27 LAB — T4, FREE: Free T4: 0.89 ng/dL (ref 0.60–1.60)

## 2015-03-27 LAB — TSH: TSH: 0.55 u[IU]/mL (ref 0.35–4.50)

## 2015-03-28 ENCOUNTER — Other Ambulatory Visit: Payer: Self-pay | Admitting: Family Medicine

## 2015-03-28 MED ORDER — LEVOFLOXACIN 250 MG PO TABS: 250.0000 mg | ORAL_TABLET | Freq: Every day | ORAL | Status: DC

## 2015-03-28 MED ORDER — AMOXICILLIN 500 MG PO CAPS: 1000.0000 mg | ORAL_CAPSULE | Freq: Two times a day (BID) | ORAL | Status: DC

## 2015-03-28 MED ORDER — OMEPRAZOLE 20 MG PO CPDR
20.0000 mg | DELAYED_RELEASE_CAPSULE | Freq: Every day | ORAL | Status: DC
Start: 2015-03-28 — End: 2015-07-04

## 2015-04-03 ENCOUNTER — Telehealth: Payer: Self-pay | Admitting: Family Medicine

## 2015-04-03 NOTE — Telephone Encounter (Signed)
Pt called wanting to get her lab results She also wanted to know if she needs to get her b12 shot tomorrow Please advise

## 2015-04-04 ENCOUNTER — Ambulatory Visit (INDEPENDENT_AMBULATORY_CARE_PROVIDER_SITE_OTHER): Payer: BLUE CROSS/BLUE SHIELD

## 2015-04-04 DIAGNOSIS — E538 Deficiency of other specified B group vitamins: Secondary | ICD-10-CM

## 2015-04-04 MED ORDER — CYANOCOBALAMIN 1000 MCG/ML IJ SOLN
1000.0000 ug | INTRAMUSCULAR | Status: DC
  Administered 2015-04-04: 1000 ug via INTRAMUSCULAR

## 2015-04-04 NOTE — Telephone Encounter (Signed)
Spoke to pt and advised her that i had already spoken to her regarding results. She states she remembered that, but was not advised about b12. I advised pt there were no comments regarding her b12 results; states she is RTO to have b12 injection.

## 2015-04-11 ENCOUNTER — Encounter: Payer: Self-pay | Admitting: Family Medicine

## 2015-04-11 ENCOUNTER — Ambulatory Visit (INDEPENDENT_AMBULATORY_CARE_PROVIDER_SITE_OTHER): Payer: BLUE CROSS/BLUE SHIELD | Admitting: Family Medicine

## 2015-04-11 VITALS — BP 132/80 | HR 82 | Temp 98.5°F | Wt 165.2 lb

## 2015-04-11 DIAGNOSIS — A048 Other specified bacterial intestinal infections: Secondary | ICD-10-CM

## 2015-04-11 DIAGNOSIS — B9681 Helicobacter pylori [H. pylori] as the cause of diseases classified elsewhere: Secondary | ICD-10-CM

## 2015-04-11 DIAGNOSIS — R5383 Other fatigue: Secondary | ICD-10-CM | POA: Diagnosis not present

## 2015-04-11 NOTE — Patient Instructions (Signed)
Good to see you. Please stop by to see [name] on your way out. 

## 2015-04-11 NOTE — Progress Notes (Signed)
Subjective:   Patient ID: [name], female    DOB: 1950-07-04, 64 y.o.   MRN: [id number]  [name] is a pleasant 64 y.o. year old female who presents to clinic today with Follow-up and Cough  on 04/11/2015  HPI:  Saw her on 10/10 for multiple complaints including abdominal bloating, early satiety, fatigue and belching.  Checked H pylori which was positive- multiple abx allergies so sent in rxs for amoxicillin, levaquin and omeprazole.  She is here today because she "feels no better." Took amoxicillin, took all but 1 pill of levaquin but could not take omeprazole because it made her cough. She is taking Zantac because now she remembers that all PPIs cause this side effect for her.  Fatigue has improved.  Epigastric pain has persisted- although she thinks this may be a little better.  Stools are darker- she is not sure if they are black.    Recent Results (from the past 2160 hour(s))  H. pylori antibody, IgG     Status: Abnormal   Collection Time: 03/26/15  4:39 PM  Result Value Ref Range   H Pylori IgG Positive (A) Negative  CBC with Differential/Platelet     Status: None   Collection Time: 03/26/15  4:39 PM  Result Value Ref Range   WBC 6.9 4.0 - 10.5 K/uL   RBC 4.74 3.87 - 5.11 Mil/uL   Hemoglobin 13.9 12.0 - 15.0 g/dL   HCT 41.5 36.0 - 46.0 %   MCV 87.6 78.0 - 100.0 fl   MCHC 33.5 30.0 - 36.0 g/dL   RDW 14.1 11.5 - 15.5 %   Platelets 276.0 150.0 - 400.0 K/uL   Neutrophils Relative % 55.5 43.0 - 77.0 %   Lymphocytes Relative 33.7 12.0 - 46.0 %   Monocytes Relative 5.9 3.0 - 12.0 %   Eosinophils Relative 2.9 0.0 - 5.0 %   Basophils Relative 2.0 0.0 - 3.0 %   Neutro Abs 3.8 1.4 - 7.7 K/uL   Lymphs Abs 2.3 0.7 - 4.0 K/uL   Monocytes Absolute 0.4 0.1 - 1.0 K/uL   Eosinophils Absolute 0.2 0.0 - 0.7 K/uL   Basophils Absolute 0.1 0.0 - 0.1 K/uL  TSH     Status: None   Collection Time: 03/26/15  4:39 PM  Result Value Ref Range   TSH 0.55 0.35 - 4.50 uIU/mL    Vitamin B12     Status: None   Collection Time: 03/26/15  4:39 PM  Result Value Ref Range   Vitamin B-12 521 211 - 911 pg/mL  Lipase     Status: None   Collection Time: 03/26/15  4:39 PM  Result Value Ref Range   Lipase 47.0 11.0 - 59.0 U/L  Comprehensive metabolic panel     Status: Abnormal   Collection Time: 03/26/15  4:39 PM  Result Value Ref Range   Sodium 142 135 - 145 mEq/L   Potassium 3.5 3.5 - 5.1 mEq/L   Chloride 107 96 - 112 mEq/L   CO2 24 19 - 32 mEq/L   Glucose, Bld 92 70 - 99 mg/dL   BUN 12 6 - 23 mg/dL   Creatinine, Ser 1.11 0.40 - 1.20 mg/dL   Total Bilirubin 0.2 0.2 - 1.2 mg/dL   Alkaline Phosphatase 85 39 - 117 U/L   AST 17 0 - 37 U/L   ALT 12 0 - 35 U/L   Total Protein 7.4 6.0 - 8.3 g/dL   Albumin 4.3 3.5 - 5.2  g/dL   Calcium 9.4 8.4 - 10.5 mg/dL   GFR 52.48 (L) >60.00 mL/min  T4, Free     Status: None   Collection Time: 03/26/15  4:39 PM  Result Value Ref Range   Free T4 0.89 0.60 - 1.60 ng/dL   Current Outpatient Prescriptions on File Prior to Visit  Medication Sig Dispense Refill  . acetaminophen (TYLENOL) 325 MG tablet Take 650 mg by mouth every 6 (six) hours as needed.      . ALPRAZolam (XANAX) 0.25 MG tablet TAKE ONE TABLET BY MOUTH THREE TIMES DAILY AS NEEDED 90 tablet 0  . amLODipine-benazepril (LOTREL) 5-20 MG per capsule Take 1 capsule by mouth daily. 30 capsule 6  . levofloxacin (LEVAQUIN) 250 MG tablet Take 1 tablet (250 mg total) by mouth daily. 14 tablet 0  . loratadine (CLARITIN) 10 MG tablet Take 10 mg by mouth daily as needed.      [name] omeprazole (PRILOSEC) 20 MG capsule Take 1 capsule (20 mg total) by mouth daily. 40 capsule 3   No current facility-administered medications on file prior to visit.    Allergies  Allergen Reactions  . Omeprazole     Cough, shortness of breath  . Buspar [Buspirone] Nausea Only    Sweating and dizzy  . Clarithromycin Swelling  . Codeine     REACTION: unspecified  . Doxycycline     REACTION:  unspecified  . Famotidine     REACTION: rash  . Guaifenesin     REACTION: palpitations  . Latex     REACTION: causes rash  . Prednisone     REACTION: anxiety  . Shellfish-Derived Products     anaphylaxis  . Sulfonamide Derivatives     REACTION: unspecified  . Tetracycline     REACTION: nausea    Past Medical History  Diagnosis Date  . Unspecified essential hypertension   . Syncope and collapse   . Tobacco use disorder     Past Surgical History  Procedure Laterality Date  . Mass excision Right 12/27/2013    Procedure: MINOR EXCISION OF RIGHT THUMB MUCOID CYST, DEBRIDEMENT OF INTERPHALANGEAL JOINT;  Surgeon: Cammie Sickle, MD;  Location: [facility];  Service: Orthopedics;  Laterality: Right;    No family history on file.  Social History   Social History  . Marital Status: Married    Spouse Name: N/A  . Number of Children: N/A  . Years of Education: N/A   Occupational History  . Not on file.   Social History Main Topics  . Smoking status: Current Every Day Smoker -- 1.00 packs/day    Types: Cigarettes  . Smokeless tobacco: Never Used  . Alcohol Use: No  . Drug Use: No  . Sexual Activity: Not on file   Other Topics Concern  . Not on file   Social History Narrative   The PMH, PSH, Social History, Family History, Medications, and allergies have been reviewed in [facility], and have been updated if relevant.   Review of Systems  Constitutional: Positive for fatigue. Negative for fever.  Respiratory: Negative.   Gastrointestinal: Positive for abdominal pain. Negative for nausea, vomiting, diarrhea, constipation, blood in stool, abdominal distention, anal bleeding and rectal pain.  Endocrine: Negative.   Genitourinary: Negative.   Musculoskeletal: Negative.   Skin: Negative.   Allergic/Immunologic: Negative.   Neurological: Negative.   Hematological: Negative.   Psychiatric/Behavioral: Negative.   All other systems reviewed and are  negative.      Objective:  BP 132/80 mmHg  Pulse 82  Temp(Src) 98.5 F (36.9 C) (Oral)  Wt 165 lb 4 oz (74.957 kg)  SpO2 96%   Physical Exam  Constitutional: She is oriented to person, place, and time. She appears well-developed and well-nourished. No distress.  HENT:  Head: Normocephalic.  Eyes: Conjunctivae are normal.  Cardiovascular: Normal rate.   Pulmonary/Chest: Effort normal.  Abdominal: Soft. She exhibits no distension and no mass. There is no tenderness. There is no rebound and no guarding.  Neurological: She is alert and oriented to person, place, and time. No cranial nerve deficit.  Skin: Skin is warm and dry.  Psychiatric: She has a normal mood and affect. Her behavior is normal. Judgment and thought content normal.          Assessment & Plan:   H. pylori infection - Plan: Ambulatory referral to Gastroenterology  Other fatigue - Plan: Ambulatory referral to Gastroenterology No Follow-up on file.

## 2015-04-11 NOTE — Progress Notes (Signed)
Pre visit review using our clinic review tool, if applicable. No additional management support is needed unless otherwise documented below in the visit note. 

## 2015-04-11 NOTE — Assessment & Plan Note (Signed)
>  25 minutes spent in face to face time with patient, >50% spent in counselling or coordination of care Given her possible black stools, refer to GI for possible endoscopy to rule out PUD/ulcers and she will need follow up to confirm eradication of infection (not quite due for this). The patient indicates understanding of these issues and agrees with the plan.

## 2015-04-27 ENCOUNTER — Telehealth: Payer: Self-pay

## 2015-04-27 NOTE — Telephone Encounter (Signed)
Lm on pts vm requesting a call back 

## 2015-04-27 NOTE — Telephone Encounter (Signed)
Call pt:  We can not order blood test for confirmation that HPylori has cleared, because blood test may be inaccurate. She can either have breath testing, fecal testing or upper GI with biopsy to confirm resoultion, but all of these things are done by GI and not [place]. How is she feeling?

## 2015-04-27 NOTE — Telephone Encounter (Signed)
Pt left v/m; pt was seen 04/11/15 with GI referral; pt wants to postpone GI referral appt until after Jan 2017; pt doing a lot better and pt request Dr Deborra Medina to order H pylori labs at [facility]. Pt request cb today.

## 2015-04-30 ENCOUNTER — Other Ambulatory Visit: Payer: Self-pay | Admitting: Family Medicine

## 2015-04-30 NOTE — Telephone Encounter (Signed)
Pt has not had f/u visit since 01/2014-acute only

## 2015-04-30 NOTE — Telephone Encounter (Signed)
Spoke to pt and advised per [facility]. Pt verbally expressed understanding and will speak with GI

## 2015-04-30 NOTE — Telephone Encounter (Signed)
Rx called in to requested pharmacy 

## 2015-05-01 ENCOUNTER — Ambulatory Visit: Payer: BLUE CROSS/BLUE SHIELD | Admitting: Gastroenterology

## 2015-05-09 ENCOUNTER — Ambulatory Visit (INDEPENDENT_AMBULATORY_CARE_PROVIDER_SITE_OTHER): Payer: BLUE CROSS/BLUE SHIELD

## 2015-05-09 DIAGNOSIS — E538 Deficiency of other specified B group vitamins: Secondary | ICD-10-CM | POA: Diagnosis not present

## 2015-05-09 MED ORDER — CYANOCOBALAMIN 1000 MCG/ML IJ SOLN
1000.0000 ug | Freq: Once | INTRAMUSCULAR | Status: AC
  Administered 2015-05-09: 1000 ug via INTRAMUSCULAR

## 2015-06-12 ENCOUNTER — Ambulatory Visit: Payer: BLUE CROSS/BLUE SHIELD

## 2015-06-27 ENCOUNTER — Ambulatory Visit: Payer: BLUE CROSS/BLUE SHIELD

## 2015-06-28 ENCOUNTER — Other Ambulatory Visit: Payer: Self-pay | Admitting: Family Medicine

## 2015-06-29 NOTE — Telephone Encounter (Signed)
Lm on pts vm requesting a call back 

## 2015-06-29 NOTE — Telephone Encounter (Signed)
Ok to refill one time only.  Needs 30 minute follow up for further refills please.

## 2015-06-29 NOTE — Telephone Encounter (Signed)
Adjustment disorder has not been discussed since

## 2015-07-02 MED ORDER — AMLODIPINE BESY-BENAZEPRIL HCL 5-20 MG PO CAPS: 1.0000 | ORAL_CAPSULE | Freq: Every day | ORAL | Status: DC

## 2015-07-02 NOTE — Addendum Note (Signed)
Addended by: Modena Nunnery on: 07/02/2015 09:41 AM   Modules accepted: Orders

## 2015-07-02 NOTE — Telephone Encounter (Signed)
Pt called back. She said she has an appt on Wednesday with her gastro md. She wants to wait to schedule the appt with Deborra Medina until after what is discussed at that appt. She states the rx for the Lotrel is most important for her to get refilled right now and can wait on the xanax but still needs that rx. You can reach her until 1pm today at [phone].

## 2015-07-03 ENCOUNTER — Ambulatory Visit (INDEPENDENT_AMBULATORY_CARE_PROVIDER_SITE_OTHER): Payer: Medicare Other | Admitting: *Deleted

## 2015-07-03 DIAGNOSIS — E538 Deficiency of other specified B group vitamins: Secondary | ICD-10-CM | POA: Diagnosis not present

## 2015-07-03 MED ORDER — CYANOCOBALAMIN 1000 MCG/ML IJ SOLN
1000.0000 ug | Freq: Once | INTRAMUSCULAR | Status: AC
  Administered 2015-07-03: 1000 ug via INTRAMUSCULAR

## 2015-07-04 ENCOUNTER — Encounter: Payer: Self-pay | Admitting: Gastroenterology

## 2015-07-04 ENCOUNTER — Ambulatory Visit (INDEPENDENT_AMBULATORY_CARE_PROVIDER_SITE_OTHER): Payer: Medicare Other | Admitting: Gastroenterology

## 2015-07-04 VITALS — BP 104/64 | HR 80 | Ht 64.5 in | Wt 165.0 lb

## 2015-07-04 DIAGNOSIS — R197 Diarrhea, unspecified: Secondary | ICD-10-CM | POA: Diagnosis not present

## 2015-07-04 DIAGNOSIS — K5900 Constipation, unspecified: Secondary | ICD-10-CM

## 2015-07-04 DIAGNOSIS — A048 Other specified bacterial intestinal infections: Secondary | ICD-10-CM

## 2015-07-04 DIAGNOSIS — B9681 Helicobacter pylori [H. pylori] as the cause of diseases classified elsewhere: Secondary | ICD-10-CM

## 2015-07-04 DIAGNOSIS — K589 Irritable bowel syndrome without diarrhea: Secondary | ICD-10-CM

## 2015-07-04 MED ORDER — NA SULFATE-K SULFATE-MG SULF 17.5-3.13-1.6 GM/177ML PO SOLN: 1.0000 | Freq: Once | ORAL | Status: DC

## 2015-07-04 NOTE — Patient Instructions (Signed)
You have been scheduled for a colonoscopy. Please follow written instructions given to you at your visit today.  Please pick up your prep supplies at the pharmacy within the next 1-3 days. If you use inhalers (even only as needed), please bring them with you on the day of your procedure. Your physician has requested that you go to www.startemmi.com and enter the access code given to you at your visit today. This web site gives a general overview about your procedure. However, you should still follow specific instructions given to you by our office regarding your preparation for the procedure.  Go to the basement for labs Low FODMAP diet given

## 2015-07-05 ENCOUNTER — Other Ambulatory Visit: Payer: Medicare Other

## 2015-07-05 DIAGNOSIS — R197 Diarrhea, unspecified: Secondary | ICD-10-CM

## 2015-07-06 LAB — HELICOBACTER PYLORI  SPECIAL ANTIGEN: H. PYLORI Antigen: NOT DETECTED

## 2015-07-09 ENCOUNTER — Ambulatory Visit (AMBULATORY_SURGERY_CENTER): Payer: Medicare Other | Admitting: Gastroenterology

## 2015-07-09 ENCOUNTER — Encounter: Payer: Self-pay | Admitting: Gastroenterology

## 2015-07-09 VITALS — BP 114/64 | HR 61 | Temp 98.5°F | Resp 16 | Ht 64.5 in | Wt 165.0 lb

## 2015-07-09 DIAGNOSIS — D124 Benign neoplasm of descending colon: Secondary | ICD-10-CM | POA: Diagnosis not present

## 2015-07-09 DIAGNOSIS — D123 Benign neoplasm of transverse colon: Secondary | ICD-10-CM

## 2015-07-09 DIAGNOSIS — D125 Benign neoplasm of sigmoid colon: Secondary | ICD-10-CM | POA: Diagnosis not present

## 2015-07-09 DIAGNOSIS — R197 Diarrhea, unspecified: Secondary | ICD-10-CM | POA: Diagnosis present

## 2015-07-09 DIAGNOSIS — D128 Benign neoplasm of rectum: Secondary | ICD-10-CM

## 2015-07-09 DIAGNOSIS — D129 Benign neoplasm of anus and anal canal: Secondary | ICD-10-CM

## 2015-07-09 MED ORDER — SODIUM CHLORIDE 0.9 % IV SOLN: 500.0000 mL | INTRAVENOUS | Status: DC

## 2015-07-09 NOTE — Progress Notes (Signed)
Stable to RR 

## 2015-07-09 NOTE — Patient Instructions (Signed)
YOU HAD AN ENDOSCOPIC PROCEDURE TODAY AT [facility]:   Refer to the procedure report that was given to you for any specific questions about what was found during the examination.  If the procedure report does not answer your questions, please call your gastroenterologist to clarify.  If you requested that your care partner not be given the details of your procedure findings, then the procedure report has been included in a sealed envelope for you to review at your convenience later.  YOU SHOULD EXPECT: Some feelings of bloating in the abdomen. Passage of more gas than usual.  Walking can help get rid of the air that was put into your GI tract during the procedure and reduce the bloating. If you had a lower endoscopy (such as a colonoscopy or flexible sigmoidoscopy) you may notice spotting of blood in your stool or on the toilet paper. If you underwent a bowel prep for your procedure, you may not have a normal bowel movement for a few days.  Please Note:  You might notice some irritation and congestion in your nose or some drainage.  This is from the oxygen used during your procedure.  There is no need for concern and it should clear up in a day or so.  SYMPTOMS TO REPORT IMMEDIATELY:   Following lower endoscopy (colonoscopy or flexible sigmoidoscopy):  Excessive amounts of blood in the stool  Significant tenderness or worsening of abdominal pains  Swelling of the abdomen that is new, acute  Fever of 100F or higher    For urgent or emergent issues, a gastroenterologist can be reached at any hour by calling [phone].   DIET: Your first meal following the procedure should be a small meal and then it is ok to progress to your normal diet. Heavy or fried foods are harder to digest and may make you feel nauseous or bloated.  Likewise, meals heavy in dairy and vegetables can increase bloating.  Drink plenty of fluids but you should avoid alcoholic beverages for 24  hours.  ACTIVITY:  You should plan to take it easy for the rest of today and you should NOT DRIVE or use heavy machinery until tomorrow (because of the sedation medicines used during the test).    FOLLOW UP: Our staff will call the number listed on your records the next business day following your procedure to check on you and address any questions or concerns that you may have regarding the information given to you following your procedure. If we do not reach you, we will leave a message.  However, if you are feeling well and you are not experiencing any problems, there is no need to return our call.  We will assume that you have returned to your regular daily activities without incident.  If any biopsies were taken you will be contacted by phone or by letter within the next 1-3 weeks.  Please call us at [phone] if you have not heard about the biopsies in 3 weeks.    SIGNATURES/CONFIDENTIALITY: You and/or your care partner have signed paperwork which will be entered into your electronic medical record.  These signatures attest to the fact that that the information above on your After Visit Summary has been reviewed and is understood.  Full responsibility of the confidentiality of this discharge information lies with you and/or your care-partner.   Resume medications. Information given on polyps,diverticulosis and hemorrhoids.

## 2015-07-09 NOTE — Op Note (Signed)
[facility]  [address] [place], [address]   COLONOSCOPY PROCEDURE REPORT  PATIENT: Janice, Jackson  MR#: [id number] BIRTHDATE: 10/02/1950 , 64  yrs. old GENDER: female ENDOSCOPIST: Harl Bowie, MD REFERRED [id number] Aron, M.D. PROCEDURE DATE:  07/09/2015 PROCEDURE:   Colonoscopy, screening, Colonoscopy with cold biopsy polypectomy, and Colonoscopy with snare polypectomy First Screening Colonoscopy - Avg.  risk and is 50 yrs.  old or older Yes.  Prior Negative Screening - Now for repeat screening. N/A  History of Adenoma - Now for follow-up colonoscopy & has been > or = to 3 yrs.  N/A  Polyps removed today? Yes ASA CLASS:   Class II INDICATIONS:Screening for colonic neoplasia and Colorectal Neoplasm Risk Assessment for this procedure is average risk. MEDICATIONS: Propofol 400 mg IV and Lidocaine 40 mg IV  DESCRIPTION OF PROCEDURE:   After the risks benefits and alternatives of the procedure were thoroughly explained, informed consent was obtained.  The digital rectal exam revealed no abnormalities of the rectum.   The [id number]  endoscope was introduced through the anus and advanced to the cecum, which was identified by both the appendix and ileocecal valve. No adverse events experienced.   The quality of the prep was good.  The instrument was then slowly withdrawn as the colon was fully examined. Estimated blood loss is zero unless otherwise noted in this procedure report.   COLON FINDINGS: Six sessile polyps ranging between 5-33mm in size were found in the rectum, sigmoid colon, and transverse colon. Polypectomies were performed with a cold snare.  The resection was complete, the polyp tissue was completely retrieved and sent to histology.   A semi-pedunculated polyp ranging between 5-67mm in size was found in the sigmoid colon.  A polypectomy was performed using snare cautery.  The resection was complete, the polyp tissue was completely  retrieved and sent to histology.   Two sessile polyps ranging between 3-82mm in size were found in the descending colon.  Polypectomies were performed with cold forceps.  The resection was complete, the polyp tissue was completely retrieved and sent to histology. left-sided severe diverticulosis. Retroflexed views revealed internal hemorrhoids. The time to cecum = 8.8 Withdrawal time = 20.8   The scope was withdrawn and the procedure completed. COMPLICATIONS: There were no immediate complications.  ENDOSCOPIC IMPRESSION: 1.   Six sessile polyps ranging between 5-28mm in size were found in the rectum, sigmoid colon, and transverse colon; polypectomies were performed with a cold snare 2.   Semi-pedunculated polyp ranging between 5-45mm in size was found in the sigmoid colon; polypectomy was performed using snare cautery  3.   Two sessile polyps ranging between 3-25mm in size were found in the descending colon; polypectomies were performed with cold forceps  RECOMMENDATIONS: 1.  If the polyp(s) removed today are proven to be adenomatous (pre-cancerous) polyps, you will need a colonoscopy in 3 years. Otherwise you should continue to follow colorectal cancer screening guidelines for "routine risk" patients with a colonoscopy in 10 years.  You will receive a letter within 1-2 weeks with the results of your biopsy as well as final recommendations.  Please call my office if you have not received a letter after 3 weeks. 2.  Await pathology results  eSigned:  Harl Bowie, MD 07/09/2015 2:43 PM      PATIENT NAME:  Janice, Jackson MR#: [id number]

## 2015-07-09 NOTE — Progress Notes (Signed)
Patient denies any allergies to eggs or soy. 

## 2015-07-09 NOTE — Progress Notes (Signed)
Called to room to assist during endoscopic procedure.  Patient ID and intended procedure confirmed with present staff. Received instructions for my participation in the procedure from the performing physician.  

## 2015-07-10 ENCOUNTER — Telehealth: Payer: Self-pay | Admitting: *Deleted

## 2015-07-10 NOTE — Telephone Encounter (Signed)
  Follow up Call-  Call back number 07/09/2015  Post procedure Call Back phone  # [phone]  Permission to leave phone message Yes     Patient questions:  Message left to call us if necessary.

## 2015-07-13 ENCOUNTER — Encounter: Payer: Self-pay | Admitting: Gastroenterology

## 2015-07-16 ENCOUNTER — Ambulatory Visit (INDEPENDENT_AMBULATORY_CARE_PROVIDER_SITE_OTHER): Payer: Medicare Other | Admitting: Family Medicine

## 2015-07-16 ENCOUNTER — Encounter: Payer: Self-pay | Admitting: Family Medicine

## 2015-07-16 VITALS — BP 132/72 | HR 77 | Temp 98.1°F | Wt 164.2 lb

## 2015-07-16 DIAGNOSIS — G4700 Insomnia, unspecified: Secondary | ICD-10-CM | POA: Insufficient documentation

## 2015-07-16 DIAGNOSIS — I10 Essential (primary) hypertension: Secondary | ICD-10-CM

## 2015-07-16 DIAGNOSIS — F4323 Adjustment disorder with mixed anxiety and depressed mood: Secondary | ICD-10-CM | POA: Diagnosis not present

## 2015-07-16 MED ORDER — AMLODIPINE BESY-BENAZEPRIL HCL 5-20 MG PO CAPS: 1.0000 | ORAL_CAPSULE | Freq: Every day | ORAL | Status: DC

## 2015-07-16 MED ORDER — LORAZEPAM 0.5 MG PO TABS: 0.5000 mg | ORAL_TABLET | Freq: Every evening | ORAL | Status: DC | PRN

## 2015-07-16 NOTE — Assessment & Plan Note (Signed)
Well controlled on current rx.  Will need prior auth to continue.

## 2015-07-16 NOTE — Progress Notes (Signed)
Pre visit review using our clinic review tool, if applicable. No additional management support is needed unless otherwise documented below in the visit note. 

## 2015-07-16 NOTE — Assessment & Plan Note (Signed)
>  25 min spent with patient, at least half of which was spent on counseling insomnia.  The problem of recurrent insomnia is discussed. Avoidance of caffeine sources is strongly encouraged. Sleep hygiene issues are reviewed. The use of sedative hypnotics for temporary relief is appropriate; we discussed the addictive nature of these drugs, and a one-time only prescription for prn use of a hypnotic is given, to use no more than 3 times per week for 2-3 weeks.  Rx given for lorazepam.  Has tried and failed SSRIs, including trazodone.

## 2015-07-16 NOTE — Progress Notes (Signed)
Subjective:   Patient ID: [name], female    DOB: [birth year], 65 y.o.   MRN: [id number]  [name] is a pleasant 65 y.o. year old female who presents to clinic today with Follow-up  on 07/16/2015  HPI:   Fatigue is much better since she was treated for H Pylori. Just had a colonoscopy- awaiting results.   Has been taking xanax at bedtime for years for insomnia and anxiety. Insurance prefers to cover lorazepam.  She would like to discuss this today.  Has " a lot of stress going on," but feels she is coping ok. Denies feeling depressed.    Current Outpatient Prescriptions on File Prior to Visit  Medication Sig Dispense Refill  . acetaminophen (TYLENOL) 325 MG tablet Take 650 mg by mouth every 6 (six) hours as needed.      . loratadine (CLARITIN) 10 MG tablet Take 10 mg by mouth daily as needed.       No current facility-administered medications on file prior to visit.    Allergies  Allergen Reactions  . Buspar [Buspirone] Nausea Only    Sweating and dizzy  . Clarithromycin Swelling  . Codeine     REACTION: unspecified  . Doxycycline     REACTION: unspecified  . Famotidine     REACTION: rash  . Guaifenesin     REACTION: palpitations  . Latex     REACTION: causes rash  . Prednisone     REACTION: anxiety  . Shellfish-Derived Products     anaphylaxis  . Statins Other (See Comments)    Muscle pain  . Sulfonamide Derivatives     REACTION: unspecified  . Tetracycline Nausea Only    REACTION: nausea  . Omeprazole Nausea Only    Cough, shortness of breath    Past Medical History  Diagnosis Date  . Unspecified essential hypertension   . Syncope and collapse   . Tobacco use disorder   . Arnold-Chiari malformation [facility])     Past Surgical History  Procedure Laterality Date  . Mass excision Right 12/27/2013    Procedure: MINOR EXCISION OF RIGHT THUMB MUCOID CYST, DEBRIDEMENT OF INTERPHALANGEAL JOINT;  Surgeon: Cammie Sickle, MD;  Location: [facility];  Service: Orthopedics;  Laterality: Right;  . Tubal ligation    . Cholecystectomy    . Arnold chiari surgery      neurocranial surgery    Family History  Problem Relation Age of Onset  . Bone cancer Mother   . Heart disease Mother   . Cancer Sister     metastatic  . Tuberculosis Father   . Diverticulosis Sister   . Colon cancer Neg Hx     Social History   Social History  . Marital Status: Married    Spouse Name: N/A  . Number of Children: 2  . Years of Education: N/A   Occupational History  . hair dresser    Social History Main Topics  . Smoking status: Current Every Day Smoker -- 1.00 packs/day    Types: Cigarettes  . Smokeless tobacco: Never Used  . Alcohol Use: No  . Drug Use: No  . Sexual Activity: Not on file   Other Topics Concern  . Not on file   Social History Narrative   The PMH, PSH, Social History, Family History, Medications, and allergies have been reviewed in [facility], and have been updated if relevant.   Review of Systems  Constitutional: Negative for fatigue.  HENT: Negative.  Eyes: Negative.   Respiratory: Negative.   Cardiovascular: Negative.   Gastrointestinal: Negative for nausea, vomiting, diarrhea, constipation, blood in stool, abdominal distention and rectal pain.  Endocrine: Negative.   Genitourinary: Negative for dysuria, decreased urine volume, vaginal bleeding and pelvic pain.  Musculoskeletal: Negative.   Skin: Negative.   Allergic/Immunologic: Negative.   Neurological: Negative.   Hematological: Negative.   Psychiatric/Behavioral: Positive for sleep disturbance. Negative for suicidal ideas, hallucinations, behavioral problems, confusion, self-injury, dysphoric mood, decreased concentration and agitation. The patient is nervous/anxious. The patient is not hyperactive.   All other systems reviewed and are negative.      Objective:    BP 132/72 mmHg  Pulse 77  Temp(Src) 98.1 F (36.7 C) (Oral)  Wt 164 lb  4 oz (74.503 kg)  SpO2 96%   Physical Exam  Constitutional: She is oriented to person, place, and time. She appears well-developed and well-nourished. No distress.  HENT:  Head: Normocephalic.  Eyes: Conjunctivae are normal.  Neck: Normal range of motion. Neck supple. No thyromegaly present.  Abdominal: Soft. Bowel sounds are normal. She exhibits no distension. There is no tenderness.  Musculoskeletal: Normal range of motion. She exhibits no edema.  Neurological: She is alert and oriented to person, place, and time.  Skin: Skin is warm and dry.  Psychiatric: She has a normal mood and affect. Her behavior is normal. Judgment and thought content normal.  Nursing note and vitals reviewed.         Assessment & Plan:   Adjustment disorder with mixed anxiety and depressed mood  Essential hypertension No Follow-up on file.

## 2015-07-17 ENCOUNTER — Ambulatory Visit: Payer: BLUE CROSS/BLUE SHIELD

## 2015-08-07 ENCOUNTER — Ambulatory Visit (INDEPENDENT_AMBULATORY_CARE_PROVIDER_SITE_OTHER): Payer: Medicare Other | Admitting: *Deleted

## 2015-08-07 DIAGNOSIS — E538 Deficiency of other specified B group vitamins: Secondary | ICD-10-CM

## 2015-08-07 MED ORDER — CYANOCOBALAMIN 1000 MCG/ML IJ SOLN
1000.0000 ug | Freq: Once | INTRAMUSCULAR | Status: AC
  Administered 2015-08-07: 1000 ug via INTRAMUSCULAR

## 2015-08-14 ENCOUNTER — Telehealth: Payer: Self-pay

## 2015-08-14 MED ORDER — ALPRAZOLAM 0.25 MG PO TABS: 0.2500 mg | ORAL_TABLET | Freq: Three times a day (TID) | ORAL | Status: DC | PRN

## 2015-08-14 NOTE — Telephone Encounter (Signed)
Pt left v/m; pt was seen 07/16/15 and started on ativan; pt started med 3 nights ago; ativan makes "skin crawl" and pt cannot sleep. Pt request to stop ativan and restart xanax to [facility]. Pt request # 90 of xanax to [facility] today; pt will private pay since ins will not approve. Pt request cb.

## 2015-08-14 NOTE — Telephone Encounter (Signed)
Noted.  PLease update med list and ok to refill xanax as reqeusted.

## 2015-08-14 NOTE — Telephone Encounter (Signed)
Rx called in to requested pharmacy. Ativan removed from list and added to allergies

## 2015-09-03 ENCOUNTER — Telehealth: Payer: Self-pay | Admitting: *Deleted

## 2015-09-03 NOTE — Telephone Encounter (Signed)
PA for Lotrel in your IN box for completion.

## 2015-09-05 NOTE — Telephone Encounter (Signed)
Rhea with [facility] left v/m that exception request on amlodipine benezapril as been denied; letter will follow with instructions for appeal if needed.

## 2015-09-05 NOTE — Telephone Encounter (Signed)
Completed form faxed back to requested party. Awaiting response

## 2015-09-06 NOTE — Telephone Encounter (Addendum)
[employer] [facility] left v/m that amlodipine benazepril has been approved for one year effective 09/05/2015. Member has been notified and approval letter will be sent in mail.

## 2015-09-10 ENCOUNTER — Ambulatory Visit (INDEPENDENT_AMBULATORY_CARE_PROVIDER_SITE_OTHER): Payer: Medicare Other | Admitting: Family Medicine

## 2015-09-10 ENCOUNTER — Encounter: Payer: Self-pay | Admitting: Family Medicine

## 2015-09-10 ENCOUNTER — Other Ambulatory Visit: Payer: Self-pay | Admitting: Family Medicine

## 2015-09-10 VITALS — BP 134/76 | HR 70 | Temp 98.0°F | Wt 166.5 lb

## 2015-09-10 DIAGNOSIS — R1013 Epigastric pain: Secondary | ICD-10-CM

## 2015-09-10 DIAGNOSIS — K219 Gastro-esophageal reflux disease without esophagitis: Secondary | ICD-10-CM | POA: Diagnosis not present

## 2015-09-10 DIAGNOSIS — E538 Deficiency of other specified B group vitamins: Secondary | ICD-10-CM

## 2015-09-10 DIAGNOSIS — E785 Hyperlipidemia, unspecified: Secondary | ICD-10-CM

## 2015-09-10 DIAGNOSIS — I10 Essential (primary) hypertension: Secondary | ICD-10-CM

## 2015-09-10 MED ORDER — CYANOCOBALAMIN 1000 MCG/ML IJ SOLN
1000.0000 ug | Freq: Once | INTRAMUSCULAR | Status: AC
  Administered 2015-09-10: 1000 ug via INTRAMUSCULAR

## 2015-09-10 MED ORDER — PANTOPRAZOLE SODIUM 40 MG PO TBEC: 40.0000 mg | DELAYED_RELEASE_TABLET | Freq: Every day | ORAL | Status: DC

## 2015-09-10 NOTE — Assessment & Plan Note (Signed)
Well controlled.  No changes made. 

## 2015-09-10 NOTE — Progress Notes (Signed)
Pre visit review using our clinic review tool, if applicable. No additional management support is needed unless otherwise documented below in the visit note. 

## 2015-09-10 NOTE — Patient Instructions (Signed)
Good to see you. Please stop by to see [name] on your way out. 

## 2015-09-10 NOTE — Assessment & Plan Note (Signed)
Persistent. Restart PPI- eRx sent for protonix. Refer back to GI for endoscopy. The patient indicates understanding of these issues and agrees with the plan.

## 2015-09-10 NOTE — Progress Notes (Signed)
Subjective:   Patient ID: [name], female    DOB: 1951-06-08, 65 y.o.   MRN: [id number]  [name] is a pleasant 65 y.o. year old female who presents to clinic today with Follow-up and Abdominal Pain  on 09/10/2015  HPI:  HTN- BP has been well controlled on current dose of Lotrel. Denies HA, blurred vision, CP or SOB.  Vit B12 deficiency- energy has improved some with IM injections and after treatment of H. Pylori.  Still having right upper quadrant pain and nausea.  Remote h/o cholecystectomy. No vomiting. No fevers.  Recent workup and colonoscopy- (polyps) by Dr. Silverio Decamp.  Notes and studies reviewed.  Lipase neg in 04/26/15.  Diarrhea is somewhat better.  Lab Results  Component Value Date   [id number] 03/26/2015    Current Outpatient Prescriptions on File Prior to Visit  Medication Sig Dispense Refill  . acetaminophen (TYLENOL) 325 MG tablet Take 650 mg by mouth every 6 (six) hours as needed.      . ALPRAZolam (XANAX) 0.25 MG tablet Take 1 tablet (0.25 mg total) by mouth 3 (three) times daily as needed. 90 tablet 0  . amLODipine-benazepril (LOTREL) 5-20 MG capsule Take 1 capsule by mouth daily. 30 capsule 3  . loratadine (CLARITIN) 10 MG tablet Take 10 mg by mouth daily as needed.       No current facility-administered medications on file prior to visit.    Allergies  Allergen Reactions  . Ativan [Lorazepam] Other (See Comments)    Makes "skin crawl" and insomnia  . Buspar [Buspirone] Nausea Only    Sweating and dizzy  . Clarithromycin Swelling  . Codeine     REACTION: unspecified  . Doxycycline     REACTION: unspecified  . Famotidine     REACTION: rash  . Guaifenesin     REACTION: palpitations  . Latex     REACTION: causes rash  . Prednisone     REACTION: anxiety  . Shellfish-Derived Products     anaphylaxis  . Statins Other (See Comments)    Muscle pain  . Sulfonamide Derivatives     REACTION: unspecified  . Tetracycline Nausea Only      REACTION: nausea  . Omeprazole Nausea Only    Cough, shortness of breath    Past Medical History  Diagnosis Date  . Unspecified essential hypertension   . Syncope and collapse   . Tobacco use disorder   . Arnold-Chiari malformation [facility])     Past Surgical History  Procedure Laterality Date  . Mass excision Right 12/27/2013    Procedure: MINOR EXCISION OF RIGHT THUMB MUCOID CYST, DEBRIDEMENT OF INTERPHALANGEAL JOINT;  Surgeon: Cammie Sickle, MD;  Location: [place];  Service: Orthopedics;  Laterality: Right;  . Tubal ligation    . Cholecystectomy    . Arnold chiari surgery      neurocranial surgery    Family History  Problem Relation Age of Onset  . Bone cancer Mother   . Heart disease Mother   . Cancer Sister     metastatic  . Tuberculosis Father   . Diverticulosis Sister   . Colon cancer Neg Hx     Social History   Social History  . Marital Status: Married    Spouse Name: N/A  . Number of Children: 2  . Years of Education: N/A   Occupational History  . hair dresser    Social History Main Topics  . Smoking status: Current Every  Day Smoker -- 1.00 packs/day    Types: Cigarettes  . Smokeless tobacco: Never Used  . Alcohol Use: No  . Drug Use: No  . Sexual Activity: Not on file   Other Topics Concern  . Not on file   Social History Narrative   The PMH, PSH, Social History, Family History, Medications, and allergies have been reviewed in [facility], and have been updated if relevant.  Review of Systems  Constitutional: Negative.   Gastrointestinal: Positive for nausea, abdominal pain and diarrhea. Negative for vomiting, constipation, blood in stool, abdominal distention, anal bleeding and rectal pain.  Musculoskeletal: Negative.   Psychiatric/Behavioral: Negative.   All other systems reviewed and are negative.      Objective:    BP 134/76 mmHg  Pulse 70  Temp(Src) 98 F (36.7 C) (Oral)  Wt 166 lb 8 oz (75.524 kg)  SpO2  98%   Physical Exam  Constitutional: She is oriented to person, place, and time. She appears well-developed and well-nourished. No distress.  HENT:  Head: Normocephalic and atraumatic.  Eyes: Conjunctivae are normal.  Neck: Normal range of motion.  Cardiovascular: Normal rate.   Pulmonary/Chest: Effort normal.  Abdominal: Soft. She exhibits no distension and no mass. There is tenderness. There is no rebound and no guarding.  Musculoskeletal: Normal range of motion.  Neurological: She is oriented to person, place, and time. No cranial nerve deficit.  Skin: Skin is warm and dry. She is not diaphoretic.  Psychiatric: She has a normal mood and affect. Her behavior is normal. Judgment and thought content normal.  Nursing note and vitals reviewed.         Assessment & Plan:   Essential hypertension  HLD (hyperlipidemia)  Vitamin B12 deficiency - Plan: cyanocobalamin ((VITAMIN B-12)) injection 1,000 mcg  Gastroesophageal reflux disease, esophagitis presence not specified  Abdominal pain, epigastric - Plan: Ambulatory referral to Gastroenterology No Follow-up on file.

## 2015-09-10 NOTE — Assessment & Plan Note (Signed)
IM Vit B12 today.

## 2015-09-11 ENCOUNTER — Ambulatory Visit: Payer: Medicare Other

## 2015-09-11 NOTE — Telephone Encounter (Signed)
Last f/u 06/2015 

## 2015-09-11 NOTE — Progress Notes (Signed)
[name]    [id number]    17-Jun-1950  Primary Care Physician:Talia Deborra Medina, MD  Referring Physician: Lucille Passy, MD [place], [place] [address]  Chief complaint: Diarrhea and constipation  HPI: 65 year old female here for new patient visit with complaints of abdominal bloating, constipation and intermittent diarrhea she had elevated IgG antibody for H. pylori and was treated by Dr. Arnette Norris. She reports improvement in her epigastric pain after she was treated for H. Pylori. She complains of constipation, increased bloating and intermittent diarrhea.  No previous colonoscopy or endoscopy. Denies any blood per rectum or melena. No weight loss.   Outpatient Encounter Prescriptions as of 07/04/2015  Medication Sig  . acetaminophen (TYLENOL) 325 MG tablet Take 650 mg by mouth every 6 (six) hours as needed.    . loratadine (CLARITIN) 10 MG tablet Take 10 mg by mouth daily as needed.    . [DISCONTINUED] ALPRAZolam (XANAX) 0.25 MG tablet TAKE ONE TABLET BY MOUTH THREE TIMES DAILY AS NEEDED  . [DISCONTINUED] amLODipine-benazepril (LOTREL) 5-20 MG capsule Take 1 capsule by mouth daily.  . [DISCONTINUED] levofloxacin (LEVAQUIN) 250 MG tablet Take 1 tablet (250 mg total) by mouth daily.  . [DISCONTINUED] Na Sulfate-K Sulfate-Mg Sulf (SUPREP BOWEL PREP) SOLN Take 1 kit by mouth once.  . [DISCONTINUED] omeprazole (PRILOSEC) 20 MG capsule Take 1 capsule (20 mg total) by mouth daily.   No facility-administered encounter medications on file as of 07/04/2015.    Allergies as of 07/04/2015 - Review Complete 07/04/2015  Allergen Reaction Noted  . Buspar [buspirone] Nausea Only 12/09/2011  . Clarithromycin Swelling 03/17/2013  . Codeine  09/07/2006  . Doxycycline  09/07/2006  . Famotidine  09/07/2006  . Guaifenesin  09/07/2006  . Latex    . Prednisone  09/07/2006  . Shellfish-derived products  11/19/2011  . Statins  07/04/2015  . Sulfonamide derivatives  09/07/2006    . Tetracycline  09/07/2006  . Omeprazole Nausea Only 04/11/2015    Past Medical History  Diagnosis Date  . Unspecified essential hypertension   . Syncope and collapse   . Tobacco use disorder   . Arnold-Chiari malformation [facility])     Past Surgical History  Procedure Laterality Date  . Mass excision Right 12/27/2013    Procedure: MINOR EXCISION OF RIGHT THUMB MUCOID CYST, DEBRIDEMENT OF INTERPHALANGEAL JOINT;  Surgeon: Cammie Sickle, MD;  Location: [facility];  Service: Orthopedics;  Laterality: Right;  . Tubal ligation    . Cholecystectomy    . Arnold chiari surgery      neurocranial surgery    Family History  Problem Relation Age of Onset  . Bone cancer Mother   . Heart disease Mother   . Cancer Sister     metastatic  . Tuberculosis Father   . Diverticulosis Sister   . Colon cancer Neg Hx     Social History   Social History  . Marital Status: Married    Spouse Name: N/A  . Number of Children: 2  . Years of Education: N/A   Occupational History  . hair dresser    Social History Main Topics  . Smoking status: Current Every Day Smoker -- 1.00 packs/day    Types: Cigarettes  . Smokeless tobacco: Never Used  . Alcohol Use: No  . Drug Use: No  . Sexual Activity: Not on file   Other Topics Concern  . Not on file  Social History Narrative      Review of systems: Review of Systems  Constitutional: Negative for fever and chills.  HENT: Negative.   Eyes: Negative for blurred vision.  Respiratory: Negative for cough, shortness of breath and wheezing.   Cardiovascular: Negative for chest pain and palpitations.  Gastrointestinal: as per HPI Genitourinary: Negative for dysuria, urgency, frequency and hematuria.  Musculoskeletal: Negative for myalgias, back pain and joint pain.  Skin: Negative for itching and rash.  Neurological: Negative for dizziness, tremors, focal weakness, seizures and loss of consciousness.  Endo/Heme/Allergies:  Negative for environmental allergies.  Psychiatric/Behavioral: Negative for depression, suicidal ideas and hallucinations.  All other systems reviewed and are negative.   Physical Exam: Filed Vitals:   07/04/15 1017  BP: 104/64  Pulse: 80   Gen:      No acute distress HEENT:  EOMI, sclera anicteric Neck:     No masses; no thyromegaly Lungs:    Clear to auscultation bilaterally; normal respiratory effort CV:         Regular rate and rhythm; no murmurs Abd:      + bowel sounds; soft, non-tender; no palpable masses, no distension Ext:    No edema; adequate peripheral perfusion Skin:      Warm and dry; no rash Neuro: alert and oriented x 3 Psych: normal mood and affect  Data Reviewed: Reviewed chart and relevant data as per history of present illness   Assessment and Plan/Recommendations: 65 year old female with history of H. pylori here to establish care with complaints of epigastric pain, alternating diarrhea and constipation likely related to irritable bowel syndrome We'll check stool H. pylori antigen to document eradication of the H. pylori infection Schedule for colonoscopy Low fodmap diet for irritable bowel syndrome, avoid lactose, artificial sweeteners and high fructose drinks Return after procedure as needed  Damaris Hippo , MD [phone] Mon-Fri 8a-5p [phone] after 5p, weekends, holidays

## 2015-09-11 NOTE — Telephone Encounter (Signed)
Rx called in to requested pharmacy 

## 2015-09-12 ENCOUNTER — Encounter: Payer: Self-pay | Admitting: Physician Assistant

## 2015-09-12 ENCOUNTER — Ambulatory Visit (INDEPENDENT_AMBULATORY_CARE_PROVIDER_SITE_OTHER): Payer: Medicare Other | Admitting: Physician Assistant

## 2015-09-12 VITALS — BP 130/64 | HR 80 | Ht 64.5 in | Wt 166.4 lb

## 2015-09-12 DIAGNOSIS — R1013 Epigastric pain: Secondary | ICD-10-CM

## 2015-09-12 DIAGNOSIS — R140 Abdominal distension (gaseous): Secondary | ICD-10-CM

## 2015-09-12 DIAGNOSIS — R1011 Right upper quadrant pain: Secondary | ICD-10-CM

## 2015-09-12 NOTE — Patient Instructions (Addendum)
You have been given a separate informational sheet regarding your tobacco use, the importance of quitting and local resources to help you quit. Low gas diet provided. Try Gas-X with meals.     You have been scheduled for a CT scan of the abdomen and pelvis at [facility] ([address]---this is in the same building as Press [facility]).   You are scheduled on 09-17-2015 at 2:30 PM. You should arrive 15 minutes prior to your appointment time for registration. Please follow the written instructions below on the day of your exam:  WARNING: IF YOU ARE ALLERGIC TO IODINE/X-RAY DYE, PLEASE NOTIFY RADIOLOGY IMMEDIATELY AT [phone]! YOU WILL BE GIVEN A 13 HOUR PREMEDICATION PREP.  1) Do not eat or drink anything after 10:30 am  (4 hours prior to your test) 2) You have been given 2 bottles of oral contrast to drink. The solution may taste better if refrigerated, but do NOT add ice or any other liquid to this solution. Shake  well before drinking.    Drink 1 bottle of contrast @ 12:30 PM (2 hours prior to your exam)  Drink 1 bottle of contrast @ 1:30 PM (1 hour prior to your exam)  You may take any medications as prescribed with a small amount of water except for the following: Metformin, Glucophage, Glucovance, Avandamet, Riomet, Fortamet, Actoplus Met, Janumet, Glumetza or Metaglip. The above medications must be held the day of the exam AND 48 hours after the exam.  The purpose of you drinking the oral contrast is to aid in the visualization of your intestinal tract. The contrast solution may cause some diarrhea. Before your exam is started, you will be given a small amount of fluid to drink. Depending on your individual set of symptoms, you may also receive an intravenous injection of x-ray contrast/dye. Plan on being at [facility] for 30 minutes or long, depending on the type of exam you are having performed.  If you have any questions regarding your exam or if you need to  reschedule, you may call the CT department at [phone] between the hours of 8:00 am and 5:00 pm, Monday-Friday.  ________________________________________________________________________

## 2015-09-12 NOTE — Progress Notes (Signed)
Patient ID: [name], female   DOB: [birth year], 65 y.o.   MRN: [id number]   Subjective:    Patient ID: [name], female    DOB: 1950/08/15, 65 y.o.   MRN: [id number]  HPI  [name] is a very nice 65 year old white female former patient of Dr. Deatra Ina now established with Dr. Silverio Decamp who comes in with complaints of right upper quadrant and epigastric discomfort persistent abdominal bloating and distention. She is referred by Dr. Arnette Norris. Patient has history of hypertension, B12 deficiency and is status post cholecystectomy proximally 10 years ago. She underwent surveillance colonoscopy in 07/09/2015 per Dr. Silverio Decamp with finding of 6 sessile polyps all 5-9 mm in COMPLETELY resected. Also noted severe left colon diverticulosis and internal hemorrhoids.  From the polyp showed tubular adenomas and hyperplastic polyps. She is to have follow-up in 3 years. Patient says she's been having GI issues over the past couple of years which is gradually progressed. She says she's miserable most of the time with daily symptoms with abdominal bloating pressure distention belching intermittent nausea and upper abdominal discomfort. She had been diagnosed with H. pylori has been treated and H. pylori antigen was negative in January 2017. She says when she was treated for the H. pylori but did help alleviate some of her symptoms. She feels that she has symptoms no matter what she eats or how much she eats. She denies use of artificial sweeteners though does drink carbonated beverages. She has heartburn but no complaints of sour brash and no dysphagia. In addition says she feels just generally fatigued and poorly most of the time. Her appetite has been fine her weight has been stable. She was just started on Protonix a few days ago and has not noticed any changes as yet. No current problems with her bowels, having normal bowel movements.  Review of Systems Pertinent positive and negative review of systems were  noted in the above HPI section.  All other review of systems was otherwise negative.  Outpatient Encounter Prescriptions as of 09/12/2015  Medication Sig  . acetaminophen (TYLENOL) 325 MG tablet Take 650 mg by mouth every 6 (six) hours as needed.    . ALPRAZolam (XANAX) 0.25 MG tablet TAKE ONE TABLET BY MOUTH THREE TIMES DAILY AS NEEDED  . amLODipine-benazepril (LOTREL) 5-20 MG capsule Take 1 capsule by mouth daily.  [name] loratadine (CLARITIN) 10 MG tablet Take 10 mg by mouth daily as needed.    . pantoprazole (PROTONIX) 40 MG tablet Take 1 tablet (40 mg total) by mouth daily.   No facility-administered encounter medications on file as of 09/12/2015.   Allergies  Allergen Reactions  . Ativan [Lorazepam] Other (See Comments)    Makes "skin crawl" and insomnia  . Buspar [Buspirone] Nausea Only    Sweating and dizzy  . Clarithromycin Swelling  . Codeine     REACTION: unspecified  . Doxycycline     REACTION: unspecified  . Famotidine     REACTION: rash  . Guaifenesin     REACTION: palpitations  . Latex     REACTION: causes rash  . Prednisone     REACTION: anxiety  . Shellfish-Derived Products     anaphylaxis  . Statins Other (See Comments)    Muscle pain  . Sulfonamide Derivatives     REACTION: unspecified  . Tetracycline Nausea Only    REACTION: nausea  . Omeprazole Nausea Only    Cough, shortness of breath   Patient Active Problem List  Diagnosis Date Noted  . Vitamin B12 deficiency 09/10/2015  . Abdominal pain, epigastric 09/10/2015  . Insomnia 07/16/2015  . H. pylori infection 04/11/2015  . GERD (gastroesophageal reflux disease) 03/26/2015  . Fatigue 10/02/2014  . Other malaise and fatigue 02/08/2014  . Alternating constipation and diarrhea 02/08/2014  . HLD (hyperlipidemia) 11/19/2011  . Vaginal dryness, menopausal 11/19/2011  . Adjustment disorder 01/08/2011  . TOBACCO ABUSE 12/19/2008  . Essential hypertension 12/19/2008   Social History   Social History  .  Marital Status: Married    Spouse Name: N/A  . Number of Children: 2  . Years of Education: N/A   Occupational History  . hair dresser    Social History Main Topics  . Smoking status: Current Every Day Smoker -- 1.00 packs/day    Types: Cigarettes  . Smokeless tobacco: Never Used     Comment: form given 09/12/15  . Alcohol Use: No  . Drug Use: No  . Sexual Activity: Not on file   Other Topics Concern  . Not on file   Social History Narrative    [name]'s family history includes Bone cancer in her mother; Cancer in her sister; Diverticulosis in her sister; Heart disease in her mother; Tuberculosis in her father. There is no history of Colon cancer, Esophageal cancer, Pancreatic cancer, Stomach cancer, Liver disease, or Kidney disease.      Objective:    Filed Vitals:   09/12/15 0851  BP: 130/64  Pulse: 80    Physical Exam  L developed older white female in no acute distress, pleasant blood pressure 130/64 pulse 80 height 5 foot 4 weight 166. HEENT; nontraumatic, cephalic EOMI PERRLA sclera anicteric, Cardiovascular ;regular rate and rhythm with S1-S2 no murmur or gallop, Pulmonary ;clear bilaterally, Abdomen ;soft mildly tender in the epigastrium and right upper quadrant there is no palpable mass or hepatosplenomegaly bowel sounds are present, Rectal; exam not done, remedies no clubbing cyanosis or edema skin warm dry, Neuropsych; mood and affect appropriate     Assessment & Plan:   #1 65 yo female with A two-year history of ongoing GI issues somewhat progressive, now with daily abdominal bloating distention belching gas and epigastric and right upper quadrant discomfort. 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 Rule out neoplasm/intra-abdominal inflammatory process #2 GERD #3 history of multiple colon polyps-colonoscopy January 2017, polyps adenomatous and hyperplastic 3 year interval follow-up recommended #4 status post remote  cholecystectomy #5 hypertension #6 hyperlipidemia  Plan; Will schedule for CT of the abdomen and pelvis with contrast Advised to continue Protonix 40 mg by mouth every morning Low gas diet, avoid carbonates Add trial of Gas-X with meals If CT scan of the abdomen and pelvis dated pelvis is unremarkable would like to give her a course of Xifaxan 550 -3 times a day 14 days    Karesa Maultsby Genia Harold PA-C 09/12/2015   Cc: Lucille Passy, MD

## 2015-09-12 NOTE — Progress Notes (Signed)
Reviewed and agree with documentation and assessment and plan. K. Veena Nandigam , MD   

## 2015-09-14 ENCOUNTER — Telehealth: Payer: Self-pay | Admitting: Physician Assistant

## 2015-09-14 NOTE — Telephone Encounter (Signed)
Patient experienced vomiting with a sulfa antibiotic. She will be drinking barium sulfate. Okay to use the barium sulfate.

## 2015-09-17 ENCOUNTER — Inpatient Hospital Stay: Admission: RE | Admit: 2015-09-17 | Payer: Medicare Other | Source: Ambulatory Visit

## 2015-09-17 NOTE — Telephone Encounter (Signed)
noted 

## 2015-09-19 ENCOUNTER — Inpatient Hospital Stay: Admission: RE | Admit: 2015-09-19 | Payer: Medicare Other | Source: Ambulatory Visit

## 2015-10-03 ENCOUNTER — Inpatient Hospital Stay: Admission: RE | Admit: 2015-10-03 | Payer: Medicare Other | Source: Ambulatory Visit

## 2015-10-18 ENCOUNTER — Telehealth: Payer: Self-pay

## 2015-10-18 NOTE — Telephone Encounter (Signed)
Pt is going to be coming to [facility] labs to have labs drawn for Dr Silverio Decamp (lab appt not scheduled yet). Pt last had B 12 labs 03/26/15 and pt wants to have B 12 labs drawn at same time if Dr Deborra Medina thinks pt needs to have B 12 level rechecked. Pt request cb. Pt does not require cb today.

## 2015-10-18 NOTE — Telephone Encounter (Signed)
Ok to add B12

## 2015-10-23 ENCOUNTER — Ambulatory Visit (INDEPENDENT_AMBULATORY_CARE_PROVIDER_SITE_OTHER): Payer: Medicare Other | Admitting: Internal Medicine

## 2015-10-23 ENCOUNTER — Encounter: Payer: Self-pay | Admitting: Internal Medicine

## 2015-10-23 VITALS — BP 128/76 | HR 70 | Temp 98.5°F | Wt 166.0 lb

## 2015-10-23 DIAGNOSIS — R300 Dysuria: Secondary | ICD-10-CM | POA: Diagnosis not present

## 2015-10-23 DIAGNOSIS — R3915 Urgency of urination: Secondary | ICD-10-CM

## 2015-10-23 DIAGNOSIS — R140 Abdominal distension (gaseous): Secondary | ICD-10-CM

## 2015-10-23 DIAGNOSIS — R3989 Other symptoms and signs involving the genitourinary system: Secondary | ICD-10-CM

## 2015-10-23 DIAGNOSIS — R350 Frequency of micturition: Secondary | ICD-10-CM

## 2015-10-23 DIAGNOSIS — R1011 Right upper quadrant pain: Secondary | ICD-10-CM

## 2015-10-23 DIAGNOSIS — N3289 Other specified disorders of bladder: Secondary | ICD-10-CM | POA: Diagnosis not present

## 2015-10-23 DIAGNOSIS — R1013 Epigastric pain: Secondary | ICD-10-CM

## 2015-10-23 LAB — POC URINALSYSI DIPSTICK (AUTOMATED)
Bilirubin, UA: NEGATIVE
Blood, UA: NEGATIVE
Glucose, UA: NEGATIVE
Ketones, UA: NEGATIVE
Nitrite, UA: NEGATIVE
Spec Grav, UA: 1.025
Urobilinogen, UA: NEGATIVE
pH, UA: 6

## 2015-10-23 LAB — BASIC METABOLIC PANEL
BUN: 14 mg/dL (ref 6–23)
CO2: 29 mEq/L (ref 19–32)
Calcium: 9.2 mg/dL (ref 8.4–10.5)
Chloride: 102 mEq/L (ref 96–112)
Creatinine, Ser: 1.24 mg/dL — ABNORMAL HIGH (ref 0.40–1.20)
GFR: 46.1 mL/min — ABNORMAL LOW (ref >60.00)
Glucose, Bld: 102 mg/dL — ABNORMAL HIGH (ref 70–99)
Potassium: 3.4 mEq/L — ABNORMAL LOW (ref 3.5–5.1)
Sodium: 140 mEq/L (ref 135–145)

## 2015-10-23 NOTE — Patient Instructions (Signed)

## 2015-10-23 NOTE — Progress Notes (Signed)
HPI  Pt presents to the clinic today with c/o urinary symptoms. She reports increased frequency, urgency and suprapubic pressure. She has been experiencing symptoms for 2-3 weeks. The night before last, she had to urinate 4 times, and her back was bothering her, but since then she has not had any back pain.  She denies fevers, chills, burning when she urinates, hematuria, or discharge. She is not sexually active. She has a long h/o GI issues and is getting a CT of her abdomen next week. She admits to smoking and drinking caffeine, but states she does not drink enough water.    Review of Systems  Past Medical History  Diagnosis Date  . Unspecified essential hypertension   . Syncope and collapse   . Tobacco use disorder   . Arnold-Chiari malformation (HCC)     Family History  Problem Relation Age of Onset  . Bone cancer Mother   . Heart disease Mother   . Cancer Sister     metastatic; unknown primary  . Tuberculosis Father   . Diverticulosis Sister   . Colon cancer Neg Hx   . Esophageal cancer Neg Hx   . Pancreatic cancer Neg Hx   . Stomach cancer Neg Hx   . Liver disease Neg Hx   . Kidney disease Neg Hx     Social History   Social History  . Marital Status: Married    Spouse Name: N/A  . Number of Children: 2  . Years of Education: N/A   Occupational History  . hair dresser    Social History Main Topics  . Smoking status: Current Every Day Smoker -- 1.00 packs/day    Types: Cigarettes  . Smokeless tobacco: Never Used     Comment: form given 09/12/15  . Alcohol Use: No  . Drug Use: No  . Sexual Activity: Not on file   Other Topics Concern  . Not on file   Social History Narrative    Allergies  Allergen Reactions  . Ativan [Lorazepam] Other (See Comments)    Makes "skin crawl" and insomnia  . Buspar [Buspirone] Nausea Only    Sweating and dizzy  . Clarithromycin Swelling  . Codeine     REACTION: unspecified  . Doxycycline     REACTION: unspecified  .  Famotidine     REACTION: rash  . Guaifenesin     REACTION: palpitations  . Latex     REACTION: causes rash  . Prednisone     REACTION: anxiety  . Shellfish-Derived Products     anaphylaxis  . Statins Other (See Comments)    Muscle pain  . Sulfonamide Derivatives     REACTION: unspecified  . Tetracycline Nausea Only    REACTION: nausea  . Omeprazole Nausea Only    Cough, shortness of breath    Constitutional: Denies fever, malaise, fatigue, headache or abrupt weight changes.   GU: Pt reports urgency, frequency and suprapubic pain. Denies pain with urination, burning sensation, blood in urine, odor or discharge. Skin: Denies redness, rashes, lesions or ulcercations.   No other specific complaints in a complete review of systems (except as listed in HPI above).    Objective:   Physical Exam  BP 128/76 mmHg  Pulse 70  Temp(Src) 98.5 F (36.9 C) (Oral)  Wt 166 lb (75.297 kg)  SpO2 98%  Wt Readings from Last 3 Encounters:  09/12/15 166 lb 6.4 oz (75.479 kg)  09/10/15 166 lb 8 oz (75.524 kg)  07/16/15 164 lb 4  oz (74.503 kg)    General: Appears her stated age, well developed, well nourished in NAD. Cardiovascular: Normal rate and rhythm. S1,S2 noted.   Pulmonary/Chest: Normal effort and positive vesicular breath sounds. No respiratory distress. No wheezes, rales or ronchi noted.  Abdomen: Soft. Normal bowel sounds, no bruits noted. No distention or masses noted.  Slight tenderness to palpation over the bladder area. No CVA tenderness.      Assessment & Plan:   Urinary urgency and frequency  ? Cystitis Urinalysis: trace leuks. Negative for blood or nitrates Urine Culture sent Drink plenty of fluids Avoid irritating triggers such as caffeine and smoking  RTC as needed or if symptoms persist.

## 2015-10-23 NOTE — Progress Notes (Signed)
Pre visit review using our clinic review tool, if applicable. No additional management support is needed unless otherwise documented below in the visit note. 

## 2015-10-24 ENCOUNTER — Other Ambulatory Visit (INDEPENDENT_AMBULATORY_CARE_PROVIDER_SITE_OTHER): Payer: Medicare Other

## 2015-10-24 DIAGNOSIS — E538 Deficiency of other specified B group vitamins: Secondary | ICD-10-CM | POA: Diagnosis not present

## 2015-10-24 LAB — VITAMIN B12: Vitamin B-12: 451 pg/mL (ref 211–911)

## 2015-10-25 ENCOUNTER — Telehealth: Payer: Self-pay | Admitting: Family Medicine

## 2015-10-25 LAB — URINE CULTURE: Colony Count: 30000

## 2015-10-25 NOTE — Telephone Encounter (Signed)
b12 was added to labs pt had drawn on 5/9.

## 2015-10-25 NOTE — Telephone Encounter (Signed)
Patient returned [name]'s call.

## 2015-10-29 ENCOUNTER — Ambulatory Visit (INDEPENDENT_AMBULATORY_CARE_PROVIDER_SITE_OTHER)
Admission: RE | Admit: 2015-10-29 | Discharge: 2015-10-29 | Disposition: A | Discharge status: Home / Self Care | Payer: Medicare Other | Source: Ambulatory Visit | Attending: Physician Assistant | Admitting: Physician Assistant

## 2015-10-29 DIAGNOSIS — R1011 Right upper quadrant pain: Secondary | ICD-10-CM

## 2015-10-29 DIAGNOSIS — R140 Abdominal distension (gaseous): Secondary | ICD-10-CM

## 2015-10-29 DIAGNOSIS — R1013 Epigastric pain: Secondary | ICD-10-CM | POA: Diagnosis not present

## 2015-10-29 MED ORDER — IOPAMIDOL (ISOVUE-300) INJECTION 61%
100.0000 mL | Freq: Once | INTRAVENOUS | Status: AC | PRN
  Administered 2015-10-29: 100 mL via INTRAVENOUS

## 2015-10-31 ENCOUNTER — Other Ambulatory Visit: Payer: Self-pay

## 2015-10-31 MED ORDER — RIFAXIMIN 550 MG PO TABS: 550.0000 mg | ORAL_TABLET | Freq: Three times a day (TID) | ORAL | Status: DC

## 2015-11-05 ENCOUNTER — Telehealth: Payer: Self-pay | Admitting: Physician Assistant

## 2015-11-05 NOTE — Telephone Encounter (Signed)
Lets try to round up samples at least fro 10 days , can do BID instead of TID - I have 6 days worth on my desk for you

## 2015-11-05 NOTE — Telephone Encounter (Signed)
She was to take the Xifaxin for bacterial overgrowth. Unfortunately she cannot afford it with the co-pay she will have. ($400.00) Please advise of other options. I also have a good patient list for low fodmap diet if that would be beneficial.

## 2015-11-05 NOTE — Telephone Encounter (Signed)
Patient is aware of the plan. 

## 2015-11-14 NOTE — Telephone Encounter (Signed)
I have secured her 30 tablets of Xifaxan 550 mg. What directions do you want her to follow?

## 2015-11-14 NOTE — Telephone Encounter (Signed)
Patient aware. She will come for the medication tomorrow. Written instructions with the samples at the front desk.

## 2015-11-14 NOTE — Telephone Encounter (Signed)
Please ask her to take 550 mg po TID x 10 days

## 2015-11-23 ENCOUNTER — Telehealth: Payer: Self-pay | Admitting: Physician Assistant

## 2015-11-23 ENCOUNTER — Other Ambulatory Visit: Payer: Self-pay

## 2015-11-23 DIAGNOSIS — R1013 Epigastric pain: Principal | ICD-10-CM

## 2015-11-23 DIAGNOSIS — G8929 Other chronic pain: Secondary | ICD-10-CM

## 2015-11-23 NOTE — Telephone Encounter (Signed)
Patient c/o worsened epigastric burning and increase of diarrhea since starting Xifaxan.She stopped the Protonix but her symptoms did not change. She does not know if she should continue Xifaxan, but feels she should.Pepto-Bismol decrease the upper gas but does not eliminate it. She ate toast this morning and has had 6 liquid stools since then. She aslo asks should she have an EGD since that is the only left that hasn't been done.

## 2015-11-23 NOTE — Telephone Encounter (Signed)
Advise her to stop peptobismol. Check C.diff.  Continue Xifaxan. After she completed course of Xifaxan start probiotic VSL#3 RS 1 Capsule daily for 3 months. We can go ahead schedule for EGD for evaluation of epigastric pain. Thanks

## 2015-11-23 NOTE — Telephone Encounter (Signed)
Discussed with the patient. She is in agreement with the plan. She will make arrangements for next week, but let us know if she cannot. Amb GI referral put in due to the quickness of the appt.

## 2015-11-27 ENCOUNTER — Ambulatory Visit (AMBULATORY_SURGERY_CENTER): Payer: Self-pay

## 2015-11-27 VITALS — Ht 64.5 in | Wt 164.6 lb

## 2015-11-27 DIAGNOSIS — R1011 Right upper quadrant pain: Secondary | ICD-10-CM

## 2015-11-27 NOTE — Progress Notes (Signed)
Per pt, no allergies to soy or egg products.Pt not taking any weight loss meds or using  O2 at home. 

## 2015-11-29 ENCOUNTER — Ambulatory Visit (AMBULATORY_SURGERY_CENTER): Payer: Medicare Other | Admitting: Gastroenterology

## 2015-11-29 ENCOUNTER — Encounter: Payer: Self-pay | Admitting: Gastroenterology

## 2015-11-29 VITALS — BP 130/56 | HR 68 | Resp 16

## 2015-11-29 DIAGNOSIS — D378 Neoplasm of uncertain behavior of other specified digestive organs: Secondary | ICD-10-CM

## 2015-11-29 DIAGNOSIS — R1011 Right upper quadrant pain: Secondary | ICD-10-CM | POA: Diagnosis not present

## 2015-11-29 MED ORDER — SODIUM CHLORIDE 0.9 % IV SOLN: 500.0000 mL | INTRAVENOUS | Status: DC

## 2015-11-29 NOTE — Progress Notes (Signed)
Report to PACU, RN, vss, BBS= Clear.  

## 2015-11-29 NOTE — Progress Notes (Signed)
Called to room to assist during endoscopic procedure.  Patient ID and intended procedure confirmed with present staff. Received instructions for my participation in the procedure from the performing physician.  

## 2015-11-29 NOTE — Op Note (Signed)
[facility] Patient Name: [name] Procedure Date: 11/29/2015 3:13 PM MRN: [id number] Endoscopist: Mauri Pole , MD Age: 65 Referring MD:  Date of Birth: 03-31-1951 Gender: Female Procedure:                Upper GI endoscopy Indications:              Upper abdominal symptoms that persist despite an                            appropriate trial of therapy. Epigastric and Right                            upper quadrant abdominal pain Medicines:                Monitored Anesthesia Care Procedure:                Pre-Anesthesia Assessment:                           - Prior to the procedure, a History and Physical                            was performed, and patient medications and                            allergies were reviewed. The patient's tolerance of                            previous anesthesia was also reviewed. The risks                            and benefits of the procedure and the sedation                            options and risks were discussed with the patient.                            All questions were answered, and informed consent                            was obtained. Prior Anticoagulants: The patient has                            taken no previous anticoagulant or antiplatelet                            agents. ASA Grade Assessment: II - A patient with                            mild systemic disease. After reviewing the risks                            and benefits, the patient was deemed in  satisfactory condition to undergo the procedure.                           After obtaining informed consent, the endoscope was                            passed under direct vision. Throughout the                            procedure, the patient's blood pressure, pulse, and                            oxygen saturations were monitored continuously. The                            Model GIF-[id number]) scope was  introduced                            through the mouth, and advanced to the second part                            of duodenum. The upper GI endoscopy was                            accomplished without difficulty. The patient                            tolerated the procedure well. Scope In: Scope Out: Findings:                 White nummular lesions were noted at the                            gastroesophageal junction. Biopsies were taken with                            a cold forceps for histology. This was biopsied                            with a cold forceps for possible candida                            esophagitis.                           The exam was otherwise without abnormality.                           The stomach was normal.                           The examined duodenum was normal. Complications:            No immediate complications. Estimated Blood Loss:     Estimated blood loss was minimal. Impression:               -  White nummular lesions in esophageal mucosa.                            Biopsied.                           - The examination was otherwise normal.                           - Normal stomach.                           - Normal examined duodenum. Recommendation:           - Patient has a contact number available for                            emergencies. The signs and symptoms of potential                            delayed complications were discussed with the                            patient. Return to normal activities tomorrow.                            Written discharge instructions were provided to the                            patient.                           - Resume previous diet.                           - Continue present medications.                           - Await pathology results.                           - No repeat upper endoscopy.                           - Return to GI office PRN. Mauri Pole, MD 11/29/2015  3:35:57 PM This report has been signed electronically.

## 2015-11-29 NOTE — Patient Instructions (Signed)
Impression/recommendations:  White nummular lesions to rule out candida esophagitis.  Continue current medications. Await biopsy results.  YOU HAD AN ENDOSCOPIC PROCEDURE TODAY AT [facility]:   Refer to the procedure report that was given to you for any specific questions about what was found during the examination.  If the procedure report does not answer your questions, please call your gastroenterologist to clarify.  If you requested that your care partner not be given the details of your procedure findings, then the procedure report has been included in a sealed envelope for you to review at your convenience later.  YOU SHOULD EXPECT: Some feelings of bloating in the abdomen. Passage of more gas than usual.  Walking can help get rid of the air that was put into your GI tract during the procedure and reduce the bloating. If you had a lower endoscopy (such as a colonoscopy or flexible sigmoidoscopy) you may notice spotting of blood in your stool or on the toilet paper. If you underwent a bowel prep for your procedure, you may not have a normal bowel movement for a few days.  Please Note:  You might notice some irritation and congestion in your nose or some drainage.  This is from the oxygen used during your procedure.  There is no need for concern and it should clear up in a day or so.  SYMPTOMS TO REPORT IMMEDIATELY:   Following upper endoscopy (EGD)  Vomiting of blood or coffee ground material  New chest pain or pain under the shoulder blades  Painful or persistently difficult swallowing  New shortness of breath  Fever of 100F or higher  Black, tarry-looking stools  For urgent or emergent issues, a gastroenterologist can be reached at any hour by calling [phone].   DIET: Your first meal following the procedure should be a small meal and then it is ok to progress to your normal diet. Heavy or fried foods are harder to digest and may make you feel nauseous or  bloated.  Likewise, meals heavy in dairy and vegetables can increase bloating.  Drink plenty of fluids but you should avoid alcoholic beverages for 24 hours.  ACTIVITY:  You should plan to take it easy for the rest of today and you should NOT DRIVE or use heavy machinery until tomorrow (because of the sedation medicines used during the test).    FOLLOW UP: Our staff will call the number listed on your records the next business day following your procedure to check on you and address any questions or concerns that you may have regarding the information given to you following your procedure. If we do not reach you, we will leave a message.  However, if you are feeling well and you are not experiencing any problems, there is no need to return our call.  We will assume that you have returned to your regular daily activities without incident.  If any biopsies were taken you will be contacted by phone or by letter within the next 1-3 weeks.  Please call us at [phone] if you have not heard about the biopsies in 3 weeks.    SIGNATURES/CONFIDENTIALITY: You and/or your care partner have signed paperwork which will be entered into your electronic medical record.  These signatures attest to the fact that that the information above on your After Visit Summary has been reviewed and is understood.  Full responsibility of the confidentiality of this discharge information lies with you and/or your care-partner.

## 2015-11-30 ENCOUNTER — Telehealth: Payer: Self-pay

## 2015-11-30 NOTE — Telephone Encounter (Signed)
  Follow up Call-  Call back number 11/29/2015 07/09/2015  Post procedure Call Back phone  # [phone] work number [phone]  Permission to leave phone message Yes Yes     Patient questions:  Do you have a fever, pain , or abdominal swelling? No. Pain Score  0 *  Have you tolerated food without any problems? Yes.    Have you been able to return to your normal activities? Yes.    Do you have any questions about your discharge instructions: Diet   No. Medications  No. Follow up visit  No.  Do you have questions or concerns about your Care? No.  Actions: * If pain score is 4 or above: No action needed, pain <4.

## 2015-12-03 ENCOUNTER — Other Ambulatory Visit: Payer: Self-pay | Admitting: Family Medicine

## 2015-12-03 NOTE — Telephone Encounter (Signed)
Last f/u 06/2015 

## 2015-12-03 NOTE — Telephone Encounter (Signed)
Rx called in to requested pharmacy 

## 2015-12-06 ENCOUNTER — Encounter: Payer: Self-pay | Admitting: Gastroenterology

## 2015-12-11 ENCOUNTER — Other Ambulatory Visit: Payer: Medicare Other

## 2015-12-12 ENCOUNTER — Other Ambulatory Visit: Payer: Self-pay | Admitting: *Deleted

## 2015-12-12 ENCOUNTER — Other Ambulatory Visit (INDEPENDENT_AMBULATORY_CARE_PROVIDER_SITE_OTHER): Payer: Medicare Other

## 2015-12-12 DIAGNOSIS — R140 Abdominal distension (gaseous): Secondary | ICD-10-CM | POA: Diagnosis not present

## 2015-12-12 LAB — HEMOCCULT SLIDES (X 3 CARDS)
Fecal Occult Blood: NEGATIVE
OCCULT 1: NEGATIVE
OCCULT 2: NEGATIVE
OCCULT 3: NEGATIVE
OCCULT 4: NEGATIVE
OCCULT 5: NEGATIVE

## 2016-01-02 ENCOUNTER — Other Ambulatory Visit: Payer: Self-pay | Admitting: Family Medicine

## 2016-02-05 ENCOUNTER — Other Ambulatory Visit: Payer: Self-pay | Admitting: *Deleted

## 2016-02-05 MED ORDER — ALPRAZOLAM 0.25 MG PO TABS: 0.2500 mg | ORAL_TABLET | Freq: Three times a day (TID) | ORAL | 0 refills | Status: DC | PRN

## 2016-02-05 NOTE — Telephone Encounter (Signed)
Last f/u 08/2015

## 2016-02-06 NOTE — Telephone Encounter (Signed)
Rx called in to requested pharmacy 

## 2016-03-10 ENCOUNTER — Encounter: Payer: Self-pay | Admitting: Family Medicine

## 2016-04-03 ENCOUNTER — Encounter: Payer: Self-pay | Admitting: Family Medicine

## 2016-04-03 ENCOUNTER — Ambulatory Visit (INDEPENDENT_AMBULATORY_CARE_PROVIDER_SITE_OTHER): Payer: Medicare Other | Admitting: Family Medicine

## 2016-04-03 VITALS — BP 120/68 | HR 70 | Temp 97.9°F | Wt 165.8 lb

## 2016-04-03 DIAGNOSIS — R300 Dysuria: Secondary | ICD-10-CM | POA: Diagnosis not present

## 2016-04-03 LAB — POC URINALSYSI DIPSTICK (AUTOMATED)
Bilirubin, UA: NEGATIVE
Blood, UA: NEGATIVE
Glucose, UA: NEGATIVE
Ketones, UA: NEGATIVE
Leukocytes, UA: NEGATIVE
Nitrite, UA: NEGATIVE
Protein, UA: NEGATIVE
Spec Grav, UA: 1.01
Urobilinogen, UA: 0.2
pH, UA: 6

## 2016-04-03 MED ORDER — CIPROFLOXACIN HCL 250 MG PO TABS: 250.0000 mg | ORAL_TABLET | Freq: Two times a day (BID) | ORAL | 0 refills | Status: DC

## 2016-04-03 MED ORDER — ALPRAZOLAM 0.25 MG PO TABS: 0.2500 mg | ORAL_TABLET | Freq: Three times a day (TID) | ORAL | 0 refills | Status: DC | PRN

## 2016-04-03 NOTE — Progress Notes (Signed)
Pre visit review using our clinic review tool, if applicable. No additional management support is needed unless otherwise documented below in the visit note. 

## 2016-04-03 NOTE — Progress Notes (Signed)
SUBJECTIVE: [name] is a 65 y.o. female who complains of urinary frequency, urgency, fatigue, body aches, and dysuria x  3 days, without flank pain, fever, chills, or abnormal vaginal discharge or bleeding.   Current Outpatient Prescriptions on File Prior to Visit  Medication Sig Dispense Refill  . acetaminophen (TYLENOL) 325 MG tablet Take 650 mg by mouth every 6 (six) hours as needed.      [name] amLODipine-benazepril (LOTREL) 5-20 MG capsule Take 1 capsule by mouth daily. 30 capsule 3  . amLODipine-benazepril (LOTREL) 5-20 MG capsule TAKE ONE CAPSULE BY MOUTH DAILY 90 capsule 2  . loratadine (CLARITIN) 10 MG tablet Take 10 mg by mouth daily as needed.      . pantoprazole (PROTONIX) 40 MG tablet TAKE 1 TABLET BY MOUTH DAILY 30 tablet 3  . rifaximin (XIFAXAN) 550 MG TABS tablet Take 1 tablet (550 mg total) by mouth 3 (three) times daily. 42 tablet 0   No current facility-administered medications on file prior to visit.     Allergies  Allergen Reactions  . Ativan [Lorazepam] Other (See Comments)    Makes "skin crawl" and insomnia  . Buspar [Buspirone] Nausea Only    Sweating and dizzy  . Clarithromycin Swelling  . Codeine     REACTION: unspecified  . Doxycycline     REACTION: unspecified  . Famotidine     REACTION: rash  . Guaifenesin     REACTION: palpitations  . Latex     REACTION: causes rash  . Prednisone     REACTION: anxiety  . Shellfish-Derived Products     anaphylaxis  . Statins Other (See Comments)    Muscle pain  . Sulfonamide Derivatives     REACTION: unspecified  . Tetracycline Nausea Only    REACTION: nausea  . Omeprazole Nausea Only    Cough, shortness of breath    Past Medical History:  Diagnosis Date  . Anxiety   . Arnold-Chiari malformation ([place])   . Syncope and collapse    Per pt, denies passing out  . Tobacco use disorder   . Unspecified essential hypertension     Past Surgical History:  Procedure Laterality Date  . APPENDECTOMY    .  ARNOLD CHIARI SURGERY     neurocranial surgery  . BLEPHAROPLASTY     Bil  . CHOLECYSTECTOMY    . MASS EXCISION Right 12/27/2013   Procedure: MINOR EXCISION OF RIGHT THUMB MUCOID CYST, DEBRIDEMENT OF INTERPHALANGEAL JOINT;  Surgeon: Cammie Sickle, MD;  Location: [place];  Service: Orthopedics;  Laterality: Right;  . mass on thumb  right  . TUBAL LIGATION      Family History  Problem Relation Age of Onset  . Bone cancer Mother   . Heart disease Mother   . Cancer Sister     metastatic; unknown primary  . Diverticulosis Sister   . Tuberculosis Father   . Diabetes Sister   . Hypertension Sister   . Colon cancer Neg Hx   . Esophageal cancer Neg Hx   . Pancreatic cancer Neg Hx   . Stomach cancer Neg Hx   . Liver disease Neg Hx   . Kidney disease Neg Hx     Social History   Social History  . Marital status: Married    Spouse name: N/A  . Number of children: 2  . Years of education: N/A   Occupational History  . hair dresser    Social History Main Topics  .  Smoking status: Current Every Day Smoker    Packs/day: 1.00    Types: Cigarettes  . Smokeless tobacco: Never Used  . Alcohol use No  . Drug use: No  . Sexual activity: Not on file   Other Topics Concern  . Not on file   Social History Narrative  . No narrative on file   The PMH, PSH, Social History, Family History, Medications, and allergies have been reviewed in [facility], and have been updated if relevant.  OBJECTIVE: BP 120/68 (BP Location: Left Arm, Patient Position: Sitting, Cuff Size: Normal)   Pulse 70   Temp 97.9 F (36.6 C) (Oral)   Wt 165 lb 12 oz (75.2 kg)   SpO2 95%   BMI 28.01 kg/m   Appears well, in no apparent distress.  Vital signs are normal. The abdomen is soft without tenderness, guarding, mass, rebound or organomegaly. No CVA tenderness or inguinal adenopathy noted. Urine dipstick shows- negative for all    ASSESSMENT: UTI uncomplicated without evidence of  pyelonephritis  PLAN: Treatment per orders - will treat for UTI despite neg UA given classic symptoms, also push fluids, may use Pyridium OTC prn. Call or return to clinic prn if these symptoms worsen or fail to improve as anticipated.

## 2016-05-07 ENCOUNTER — Other Ambulatory Visit: Payer: Self-pay | Admitting: *Deleted

## 2016-05-07 MED ORDER — ALPRAZOLAM 0.25 MG PO TABS: 0.2500 mg | ORAL_TABLET | Freq: Three times a day (TID) | ORAL | 0 refills | Status: DC | PRN

## 2016-05-07 NOTE — Telephone Encounter (Signed)
Rx called in to requested pharmacy 

## 2016-05-07 NOTE — Telephone Encounter (Signed)
Last f/u 06/2015

## 2016-05-21 ENCOUNTER — Other Ambulatory Visit: Payer: Medicare Other

## 2016-05-28 ENCOUNTER — Encounter: Payer: Medicare Other | Admitting: Family Medicine

## 2016-07-24 ENCOUNTER — Other Ambulatory Visit: Payer: Self-pay | Admitting: Family Medicine

## 2016-07-24 NOTE — Telephone Encounter (Signed)
Rx called in to requested pharmacy 

## 2016-07-24 NOTE — Telephone Encounter (Signed)
Last f/u 06/2015

## 2016-08-21 ENCOUNTER — Encounter: Payer: Self-pay | Admitting: Internal Medicine

## 2016-08-21 ENCOUNTER — Ambulatory Visit (INDEPENDENT_AMBULATORY_CARE_PROVIDER_SITE_OTHER): Payer: Medicare HMO | Admitting: Internal Medicine

## 2016-08-21 VITALS — BP 134/78 | HR 81 | Temp 97.9°F | Wt 169.0 lb

## 2016-08-21 DIAGNOSIS — J301 Allergic rhinitis due to pollen: Secondary | ICD-10-CM | POA: Diagnosis not present

## 2016-08-21 MED ORDER — FLUTICASONE PROPIONATE 50 MCG/ACT NA SUSP: 2.0000 | Freq: Every day | NASAL | 6 refills | Status: DC

## 2016-08-21 NOTE — Progress Notes (Signed)
HPI  Pt presents to the clinic today with c/o ear pain, runny nose and sore throat. This started 3 weeks ago. She describes the pain as sharp and stabbing. The left ear seems worse than the right. She denies decreased hearing. She is blowing clear mucous out of her nose. She denies difficulty swallowing. She denies fever, chills or body aches. She has tried Claritin, nasal Saline and Tylenol without any relief. She has no history of allergies. She has not had sick contacts. She does smoke.  Review of Systems     Past Medical History:  Diagnosis Date  . Anxiety   . Arnold-Chiari malformation ([place])   . Syncope and collapse    Per pt, denies passing out  . Tobacco use disorder   . Unspecified essential hypertension     Family History  Problem Relation Age of Onset  . Bone cancer Mother   . Heart disease Mother   . Cancer Sister     metastatic; unknown primary  . Diverticulosis Sister   . Tuberculosis Father   . Diabetes Sister   . Hypertension Sister   . Colon cancer Neg Hx   . Esophageal cancer Neg Hx   . Pancreatic cancer Neg Hx   . Stomach cancer Neg Hx   . Liver disease Neg Hx   . Kidney disease Neg Hx     Social History   Social History  . Marital status: Married    Spouse name: N/A  . Number of children: 2  . Years of education: N/A   Occupational History  . hair dresser    Social History Main Topics  . Smoking status: Current Every Day Smoker    Packs/day: 1.00    Types: Cigarettes  . Smokeless tobacco: Never Used  . Alcohol use No  . Drug use: No  . Sexual activity: Not on file   Other Topics Concern  . Not on file   Social History Narrative  . No narrative on file    Allergies  Allergen Reactions  . Ativan [Lorazepam] Other (See Comments)    Makes "skin crawl" and insomnia  . Buspar [Buspirone] Nausea Only    Sweating and dizzy  . Clarithromycin Swelling  . Codeine     REACTION: unspecified  . Doxycycline     REACTION: unspecified  .  Famotidine     REACTION: rash  . Guaifenesin     REACTION: palpitations  . Latex     REACTION: causes rash  . Prednisone     REACTION: anxiety  . Shellfish-Derived Products     anaphylaxis  . Statins Other (See Comments)    Muscle pain  . Sulfonamide Derivatives     REACTION: unspecified  . Tetracycline Nausea Only    REACTION: nausea  . Omeprazole Nausea Only    Cough, shortness of breath     Constitutional: Denies headache, fatigue, fever or  abrupt weight changes.  HEENT:  Positive ear pain, runny nose and sore throat. Denies eye redness,  ringing in the ears, wax buildup, nasal congestion or bloody nose. Respiratory:  Denies cpugh, difficulty breathing or shortness of breath.  Cardiovascular: Denies chest pain, chest tightness, palpitations or swelling in the hands or feet.   No other specific complaints in a complete review of systems (except as listed in HPI above).  Objective:   BP 134/78   Pulse 81   Temp 97.9 F (36.6 C) (Oral)   Wt 169 lb (76.7 kg)   SpO2 97%  BMI 28.56 kg/m   General: Appears her stated age, well developed, well nourished in NAD. HEENT: Head: normal shape and size, no sinus tenderness noted; Ears: Tm's gray and intact, normal light reflex; Nose: mucosa boggy and moist, septum midline; Throat/Mouth: + PND. Teeth present, mucosa erythematous and moist, no exudate noted, no lesions or ulcerations noted.  Neck:  No adenopathy noted.  Cardiovascular: Normal rate and rhythm. S1,S2 noted.  No murmur, rubs or gallops noted.  Pulmonary/Chest: Normal effort and positive vesicular breath sounds. No respiratory distress. No wheezes, rales or ronchi noted.       Assessment & Plan:   Allergic Rhinitis:  Can use a Neti Pot which can be purchased from your local drug store. Flonase 2 sprays each nostril for 3 days and then as needed. Continue Claritin  RTC as needed or if symptoms persist. Webb Silversmith, NP

## 2016-08-21 NOTE — Patient Instructions (Signed)
Allergic Rhinitis Allergic rhinitis is when the mucous membranes in the nose respond to allergens. Allergens are particles in the air that cause your body to have an allergic reaction. This causes you to release allergic antibodies. Through a chain of events, these eventually cause you to release histamine into the blood stream. Although meant to protect the body, it is this release of histamine that causes your discomfort, such as frequent sneezing, congestion, and an itchy, runny nose. What are the causes? Seasonal allergic rhinitis (hay fever) is caused by pollen allergens that may come from grasses, trees, and weeds. Year-round allergic rhinitis (perennial allergic rhinitis) is caused by allergens such as house dust mites, pet dander, and mold spores. What are the signs or symptoms?  Nasal stuffiness (congestion).  Itchy, runny nose with sneezing and tearing of the eyes. How is this diagnosed? Your health care provider can help you determine the allergen or allergens that trigger your symptoms. If you and your health care provider are unable to determine the allergen, skin or blood testing may be used. Your health care provider will diagnose your condition after taking your health history and performing a physical exam. Your health care provider may assess you for other related conditions, such as asthma, pink eye, or an ear infection. How is this treated? Allergic rhinitis does not have a cure, but it can be controlled by:  Medicines that block allergy symptoms. These may include allergy shots, nasal sprays, and oral antihistamines.  Avoiding the allergen. Hay fever may often be treated with antihistamines in pill or nasal spray forms. Antihistamines block the effects of histamine. There are over-the-counter medicines that may help with nasal congestion and swelling around the eyes. Check with your health care provider before taking or giving this medicine. If avoiding the allergen or the  medicine prescribed do not work, there are many new medicines your health care provider can prescribe. Stronger medicine may be used if initial measures are ineffective. Desensitizing injections can be used if medicine and avoidance does not work. Desensitization is when a patient is given ongoing shots until the body becomes less sensitive to the allergen. Make sure you follow up with your health care provider if problems continue. Follow these instructions at home: It is not possible to completely avoid allergens, but you can reduce your symptoms by taking steps to limit your exposure to them. It helps to know exactly what you are allergic to so that you can avoid your specific triggers. Contact a health care provider if:  You have a fever.  You develop a cough that does not stop easily (persistent).  You have shortness of breath.  You start wheezing.  Symptoms interfere with normal daily activities. This information is not intended to replace advice given to you by your health care provider. Make sure you discuss any questions you have with your health care provider. Document Released: 02/25/2001 Document Revised: 02/01/2016 Document Reviewed: 02/07/2013 Elsevier Interactive Patient Education  2017 Elsevier Inc.  

## 2016-09-01 ENCOUNTER — Other Ambulatory Visit: Payer: Self-pay | Admitting: Family Medicine

## 2016-09-01 DIAGNOSIS — Z01419 Encounter for gynecological examination (general) (routine) without abnormal findings: Secondary | ICD-10-CM

## 2016-09-01 DIAGNOSIS — E538 Deficiency of other specified B group vitamins: Secondary | ICD-10-CM

## 2016-09-01 DIAGNOSIS — Z0000 Encounter for general adult medical examination without abnormal findings: Secondary | ICD-10-CM | POA: Insufficient documentation

## 2016-09-01 DIAGNOSIS — E785 Hyperlipidemia, unspecified: Secondary | ICD-10-CM

## 2016-09-02 ENCOUNTER — Other Ambulatory Visit (INDEPENDENT_AMBULATORY_CARE_PROVIDER_SITE_OTHER): Payer: Medicare HMO

## 2016-09-02 DIAGNOSIS — E538 Deficiency of other specified B group vitamins: Secondary | ICD-10-CM

## 2016-09-02 DIAGNOSIS — Z01419 Encounter for gynecological examination (general) (routine) without abnormal findings: Secondary | ICD-10-CM

## 2016-09-02 LAB — COMPREHENSIVE METABOLIC PANEL
ALT: 15 U/L (ref 0–35)
AST: 20 U/L (ref 0–37)
Albumin: 4.4 g/dL (ref 3.5–5.2)
Alkaline Phosphatase: 81 U/L (ref 39–117)
BUN: 12 mg/dL (ref 6–23)
CO2: 29 mEq/L (ref 19–32)
Calcium: 9.6 mg/dL (ref 8.4–10.5)
Chloride: 104 mEq/L (ref 96–112)
Creatinine, Ser: 1.14 mg/dL (ref 0.40–1.20)
GFR: 50.66 mL/min — ABNORMAL LOW (ref >60.00)
Glucose, Bld: 98 mg/dL (ref 70–99)
Potassium: 3.5 mEq/L (ref 3.5–5.1)
Sodium: 139 mEq/L (ref 135–145)
Total Bilirubin: 0.4 mg/dL (ref 0.2–1.2)
Total Protein: 7.2 g/dL (ref 6.0–8.3)

## 2016-09-02 LAB — CBC WITH DIFFERENTIAL/PLATELET
Basophils Absolute: 0.1 10*3/uL (ref 0.0–0.1)
Basophils Relative: 1.1 % (ref 0.0–3.0)
Eosinophils Absolute: 0.2 10*3/uL (ref 0.0–0.7)
Eosinophils Relative: 2 % (ref 0.0–5.0)
HCT: 43.9 % (ref 36.0–46.0)
Hemoglobin: 15 g/dL (ref 12.0–15.0)
Lymphocytes Relative: 26.7 % (ref 12.0–46.0)
Lymphs Abs: 2 10*3/uL (ref 0.7–4.0)
MCHC: 34.1 g/dL (ref 30.0–36.0)
MCV: 87.7 fl (ref 78.0–100.0)
Monocytes Absolute: 0.4 10*3/uL (ref 0.1–1.0)
Monocytes Relative: 4.9 % (ref 3.0–12.0)
Neutro Abs: 4.9 10*3/uL (ref 1.4–7.7)
Neutrophils Relative %: 65.3 % (ref 43.0–77.0)
Platelets: 272 10*3/uL (ref 150.0–400.0)
RBC: 5 Mil/uL (ref 3.87–5.11)
RDW: 13.8 % (ref 11.5–15.5)
WBC: 7.4 10*3/uL (ref 4.0–10.5)

## 2016-09-02 LAB — LIPID PANEL
Cholesterol: 257 mg/dL — ABNORMAL HIGH (ref 0–200)
HDL: 36.2 mg/dL — ABNORMAL LOW (ref >39.00)
NonHDL: 220.31
Total CHOL/HDL Ratio: 7
Triglycerides: 215 mg/dL — ABNORMAL HIGH (ref 0.0–149.0)
VLDL: 43 mg/dL — ABNORMAL HIGH (ref 0.0–40.0)

## 2016-09-02 LAB — TSH: TSH: 1.11 u[IU]/mL (ref 0.35–4.50)

## 2016-09-02 LAB — LDL CHOLESTEROL, DIRECT: Direct LDL: 193 mg/dL

## 2016-09-02 LAB — VITAMIN B12: Vitamin B-12: 348 pg/mL (ref 211–911)

## 2016-09-03 ENCOUNTER — Other Ambulatory Visit: Payer: Medicare HMO

## 2016-09-10 ENCOUNTER — Encounter: Payer: Self-pay | Admitting: Family Medicine

## 2016-09-10 ENCOUNTER — Ambulatory Visit (INDEPENDENT_AMBULATORY_CARE_PROVIDER_SITE_OTHER)
Admission: RE | Admit: 2016-09-10 | Discharge: 2016-09-10 | Disposition: A | Discharge status: Home / Self Care | Payer: Medicare HMO | Source: Ambulatory Visit | Attending: Family Medicine | Admitting: Family Medicine

## 2016-09-10 ENCOUNTER — Ambulatory Visit (INDEPENDENT_AMBULATORY_CARE_PROVIDER_SITE_OTHER): Payer: Medicare HMO | Admitting: Family Medicine

## 2016-09-10 VITALS — BP 120/87 | HR 67 | Temp 97.4°F | Ht 64.0 in | Wt 166.0 lb

## 2016-09-10 DIAGNOSIS — E785 Hyperlipidemia, unspecified: Secondary | ICD-10-CM

## 2016-09-10 DIAGNOSIS — E538 Deficiency of other specified B group vitamins: Secondary | ICD-10-CM | POA: Diagnosis not present

## 2016-09-10 DIAGNOSIS — M25561 Pain in right knee: Secondary | ICD-10-CM

## 2016-09-10 DIAGNOSIS — K219 Gastro-esophageal reflux disease without esophagitis: Secondary | ICD-10-CM

## 2016-09-10 DIAGNOSIS — I10 Essential (primary) hypertension: Secondary | ICD-10-CM | POA: Diagnosis not present

## 2016-09-10 NOTE — Progress Notes (Signed)
Subjective:   Patient ID: [name], female    DOB: [birth year], 66 y.o.   MRN: [id number]  [name] is a pleasant 66 y.o. year old female who presents to clinic today with Follow-up  on 09/10/2016  HPI:  Does not want annual medicare wellness or CPX visit today.  Right knee pain- was getting out of the car yesterday and now cannot put weight on her right knee Did not feel anything pop and has not noticed much swelling.    HTN- BP has been well controlled on current dose of Lotrel. Denies HA, blurred vision, CP or SOB.   Lab Results  Component Value Date   CREATININE 1.14 09/02/2016    Lab Results  Component Value Date   TSH 1.11 09/02/2016   Elevated cholesterol - she has refused/intolerant to rxs and is not willing to try rxs again.  Lab Results  Component Value Date   CHOL 257 (H) 09/02/2016   HDL 36.20 (L) 09/02/2016   LDLDIRECT 193.0 09/02/2016   TRIG 215.0 (H) 09/02/2016   CHOLHDL 7 09/02/2016     Current Outpatient Prescriptions on File Prior to Visit  Medication Sig Dispense Refill  . acetaminophen (TYLENOL) 325 MG tablet Take 650 mg by mouth every 6 (six) hours as needed.      . ALPRAZolam (XANAX) 0.25 MG tablet TAKE ONE TABLET BY MOUTH THREE TIMES DAILY AS NEEDED 90 tablet 0  . amLODipine-benazepril (LOTREL) 5-20 MG capsule Take 1 capsule by mouth daily. 30 capsule 3  . fluticasone (FLONASE) 50 MCG/ACT nasal spray Place 2 sprays into both nostrils daily. 16 g 6  . loratadine (CLARITIN) 10 MG tablet Take 10 mg by mouth daily as needed.       No current facility-administered medications on file prior to visit.     Allergies  Allergen Reactions  . Ativan [Lorazepam] Other (See Comments)    Makes "skin crawl" and insomnia  . Buspar [Buspirone] Nausea Only    Sweating and dizzy  . Clarithromycin Swelling  . Codeine     REACTION: unspecified  . Doxycycline     REACTION: unspecified  . Famotidine     REACTION: rash  . Guaifenesin    REACTION: palpitations  . Latex     REACTION: causes rash  . Prednisone     REACTION: anxiety  . Shellfish-Derived Products     anaphylaxis  . Statins Other (See Comments)    Muscle pain  . Sulfonamide Derivatives     REACTION: unspecified  . Tetracycline Nausea Only    REACTION: nausea  . Omeprazole Nausea Only    Cough, shortness of breath    Past Medical History:  Diagnosis Date  . Anxiety   . Arnold-Chiari malformation ([place])   . Syncope and collapse    Per pt, denies passing out  . Tobacco use disorder   . Unspecified essential hypertension     Past Surgical History:  Procedure Laterality Date  . APPENDECTOMY    . ARNOLD CHIARI SURGERY     neurocranial surgery  . BLEPHAROPLASTY     Bil  . CHOLECYSTECTOMY    . MASS EXCISION Right 12/27/2013   Procedure: MINOR EXCISION OF RIGHT THUMB MUCOID CYST, DEBRIDEMENT OF INTERPHALANGEAL JOINT;  Surgeon: Cammie Sickle, MD;  Location: [place];  Service: Orthopedics;  Laterality: Right;  . mass on thumb  right  . TUBAL LIGATION      Family History  Problem Relation Age  of Onset  . Bone cancer Mother   . Heart disease Mother   . Cancer Sister     metastatic; unknown primary  . Diverticulosis Sister   . Tuberculosis Father   . Diabetes Sister   . Hypertension Sister   . Colon cancer Neg Hx   . Esophageal cancer Neg Hx   . Pancreatic cancer Neg Hx   . Stomach cancer Neg Hx   . Liver disease Neg Hx   . Kidney disease Neg Hx     Social History   Social History  . Marital status: Married    Spouse name: N/A  . Number of children: 2  . Years of education: N/A   Occupational History  . hair dresser    Social History Main Topics  . Smoking status: Current Every Day Smoker    Packs/day: 1.00    Types: Cigarettes  . Smokeless tobacco: Never Used  . Alcohol use No  . Drug use: No  . Sexual activity: Not on file   Other Topics Concern  . Not on file   Social History Narrative  . No  narrative on file   The PMH, PSH, Social History, Family History, Medications, and allergies have been reviewed in [facility], and have been updated if relevant.   Review of Systems  Constitutional: Negative.   HENT: Negative.   Eyes: Negative.   Respiratory: Negative.   Cardiovascular: Negative.   Gastrointestinal: Negative.   Endocrine: Negative.   Genitourinary: Negative.   Musculoskeletal: Positive for joint swelling.  Skin: Negative.   Allergic/Immunologic: Negative.   Neurological: Negative.   Hematological: Negative.   Psychiatric/Behavioral: Negative.        Objective:    BP 120/87   Pulse 67   Temp 97.4 F (36.3 C)   Ht 5\' 4"  (1.626 m)   Wt 166 lb (75.3 kg)   SpO2 98%   BMI 28.49 kg/m    Physical Exam  Constitutional: She is oriented to person, place, and time. She appears well-developed and well-nourished. No distress.  HENT:  Head: Normocephalic and atraumatic.  Eyes: Conjunctivae are normal.  Cardiovascular: Normal rate and regular rhythm.   Pulmonary/Chest: Effort normal and breath sounds normal.  Musculoskeletal:       Right knee: She exhibits decreased range of motion. She exhibits no swelling, no ecchymosis, no deformity, no laceration, no erythema, normal alignment, no LCL laxity, normal meniscus and no MCL laxity. Tenderness found. Medial joint line tenderness noted. No lateral joint line tenderness noted.  Neurological: She is alert and oriented to person, place, and time. No cranial nerve deficit.  Skin: Skin is warm and dry. She is not diaphoretic.  Psychiatric: She has a normal mood and affect. Her behavior is normal. Judgment and thought content normal.  Nursing note and vitals reviewed.         Assessment & Plan:   Essential hypertension  Hyperlipidemia, unspecified hyperlipidemia type  Right knee pain, unspecified chronicity - Plan: DG Knee Complete 4 Views Right  Gastroesophageal reflux disease, esophagitis presence not  specified  Vitamin B12 deficiency - Plan: Vitamin B12 No Follow-up on file.

## 2016-09-10 NOTE — Patient Instructions (Signed)
Great to see you.  Please make when you can for a complete physical /pap smear.

## 2016-09-10 NOTE — Assessment & Plan Note (Signed)
Repeat b12 today.

## 2016-09-10 NOTE — Assessment & Plan Note (Signed)
Remains elevated and she continues to refuse rx.

## 2016-09-10 NOTE — Assessment & Plan Note (Signed)
Difficulty bearing weight. ? Meniscal injury. Given acute nature of injury, will get xray today. The patient indicates understanding of these issues and agrees with the plan.

## 2016-09-10 NOTE — Addendum Note (Signed)
Addended by: Ellamae Sia on: 09/10/2016 06:41 PM   Modules accepted: Orders

## 2016-09-10 NOTE — Assessment & Plan Note (Signed)
Well controlled on current rx. No changes made today. 

## 2016-09-11 ENCOUNTER — Telehealth: Payer: Self-pay | Admitting: Family Medicine

## 2016-09-11 NOTE — Telephone Encounter (Signed)
Patient returned call about her x-ray results.  I let patient know results and patient scheduled appointment with Dr.Copland on 09/15/16.

## 2016-09-15 ENCOUNTER — Ambulatory Visit (INDEPENDENT_AMBULATORY_CARE_PROVIDER_SITE_OTHER): Payer: Medicare HMO | Admitting: Family Medicine

## 2016-09-15 ENCOUNTER — Encounter: Payer: Self-pay | Admitting: Family Medicine

## 2016-09-15 VITALS — BP 130/84 | HR 69 | Temp 98.5°F | Ht 64.0 in | Wt 168.5 lb

## 2016-09-15 DIAGNOSIS — E538 Deficiency of other specified B group vitamins: Secondary | ICD-10-CM

## 2016-09-15 DIAGNOSIS — M2391 Unspecified internal derangement of right knee: Secondary | ICD-10-CM

## 2016-09-15 DIAGNOSIS — M25561 Pain in right knee: Secondary | ICD-10-CM | POA: Diagnosis not present

## 2016-09-15 LAB — VITAMIN B12: Vitamin B-12: 365 pg/mL (ref 211–911)

## 2016-09-15 MED ORDER — METHYLPREDNISOLONE ACETATE 40 MG/ML IJ SUSP
80.0000 mg | Freq: Once | INTRAMUSCULAR | Status: AC
  Administered 2016-09-15: 80 mg via INTRA_ARTICULAR

## 2016-09-15 NOTE — Progress Notes (Signed)
Pre visit review using our clinic review tool, if applicable. No additional management support is needed unless otherwise documented below in the visit note. 

## 2016-09-15 NOTE — Progress Notes (Signed)
Dr. Frederico Hamman T. Maxmillian Carsey, MD, [facility] [place], [address] Phone: [phone] Fax: [phone]  09/15/2016  Patient: [name], MRN: [id number], DOB: [birth year], 66 y.o.  Primary Physician:  Arnette Norris, MD   Chief Complaint  Patient presents with  . Knee Pain    Right   Subjective:   [name] is a 66 y.o. very pleasant female patient who presents with the following:  Came down a couple of weeks ago and was favoring her R leg. [place] of the [place] and [place] away. As far she knows, she has not had any specific twisting injury of the right knee, and no trauma. She has not had any falls. No prior fractures, and no operative intervention.  Initially she was having difficulty placing any weight on this at all, but she is now able to do so, but she does have significantly restricted motion and a large effusion.  15 years ago had an injection.   Asp 70 cc and inj  Past Medical History, Surgical History, Social History, Family History, Problem List, Medications, and Allergies have been reviewed and updated if relevant.  Patient Active Problem List   Diagnosis Date Noted  . Right knee pain 09/10/2016  . Vitamin B12 deficiency 09/10/2015  . Insomnia 07/16/2015  . H. pylori infection 04/11/2015  . GERD (gastroesophageal reflux disease) 03/26/2015  . Fatigue 10/02/2014  . Other malaise and fatigue 02/08/2014  . HLD (hyperlipidemia) 11/19/2011  . Vaginal dryness, menopausal 11/19/2011  . Adjustment disorder 01/08/2011  . TOBACCO ABUSE 12/19/2008  . Essential hypertension 12/19/2008    Past Medical History:  Diagnosis Date  . Anxiety   . Arnold-Chiari malformation ([place])   . Syncope and collapse    Per pt, denies passing out  . Tobacco use disorder   . Unspecified essential hypertension     Past Surgical History:  Procedure Laterality Date  . APPENDECTOMY    . ARNOLD CHIARI SURGERY     neurocranial surgery  . BLEPHAROPLASTY     Bil  . CHOLECYSTECTOMY    . MASS EXCISION Right 12/27/2013   Procedure: MINOR EXCISION OF RIGHT THUMB MUCOID CYST, DEBRIDEMENT OF INTERPHALANGEAL JOINT;  Surgeon: Cammie Sickle, MD;  Location: [facility];  Service: Orthopedics;  Laterality: Right;  . mass on thumb  right  . TUBAL LIGATION      Social History   Social History  . Marital status: Married    Spouse name: N/A  . Number of children: 2  . Years of education: N/A   Occupational History  . hair dresser    Social History Main Topics  . Smoking status: Current Every Day Smoker    Packs/day: 1.00    Types: Cigarettes  . Smokeless tobacco: Never Used  . Alcohol use No  . Drug use: No  . Sexual activity: Not on file   Other Topics Concern  . Not on file   Social History Narrative  . No narrative on file    Family History  Problem Relation Age of Onset  . Bone cancer Mother   . Heart disease Mother   . Cancer Sister     metastatic; unknown primary  . Diverticulosis Sister   . Tuberculosis Father   . Diabetes Sister   . Hypertension Sister   . Colon cancer Neg Hx   . Esophageal cancer Neg Hx   . Pancreatic cancer Neg  Hx   . Stomach cancer Neg Hx   . Liver disease Neg Hx   . Kidney disease Neg Hx     Allergies  Allergen Reactions  . Ativan [Lorazepam] Other (See Comments)    Makes "skin crawl" and insomnia  . Buspar [Buspirone] Nausea Only    Sweating and dizzy  . Clarithromycin Swelling  . Codeine     REACTION: unspecified  . Doxycycline     REACTION: unspecified  . Famotidine     REACTION: rash  . Guaifenesin     REACTION: palpitations  . Latex     REACTION: causes rash  . Prednisone     REACTION: anxiety  . Shellfish-Derived Products     anaphylaxis  . Statins Other (See Comments)    Muscle pain  . Sulfonamide Derivatives     REACTION: unspecified  . Tetracycline Nausea Only    REACTION: nausea  . Omeprazole Nausea Only      Cough, shortness of breath    Medication list reviewed and updated in full in [facility].  GEN: No fevers, chills. Nontoxic. Primarily MSK c/o today. MSK: Detailed in the HPI GI: tolerating PO intake without difficulty Neuro: No numbness, parasthesias, or tingling associated. Otherwise the pertinent positives of the ROS are noted above.   Objective:   BP 130/84   Pulse 69   Temp 98.5 F (36.9 C) (Oral)   Ht 5\' 4"  (1.626 m)   Wt 168 lb 8 oz (76.4 kg)   BMI 28.92 kg/m    GEN: WDWN, NAD, Non-toxic, Alert & Oriented x 3 HEENT: Atraumatic, Normocephalic.  Ears and Nose: No external deformity. EXTR: No clubbing/cyanosis/edema NEURO: Normal gait.  PSYCH: Normally interactive. Conversant. Not depressed or anxious appearing.  Calm demeanor.    Right knee: Large ballotable effusion. Patient lacks 4 of extension. Flexion to 90. Nontender along the tibia, fibula, patella, and fever. Stable to varus and valgus stress. Anterior and posterior drawer testing is negative. All as a special testing is equivocal given lack of motion and effusion.  Radiology: Dg Knee Complete 4 Views Right  Result Date: 09/10/2016 CLINICAL DATA:  Right knee pain. EXAM: RIGHT KNEE - COMPLETE 4+ VIEW COMPARISON:  No recent. FINDINGS: Small knee joint effusion. Patellofemoral degenerative change noted. 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 No fracture or dislocation noted. IMPRESSION: 1. Small knee joint effusion.  Patellofemoral degenerative change. 2. 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 Electronically Signed   By: Marcello Moores  Register   On: 09/10/2016 11:16    Assessment and Plan:   Acute pain of right knee - Plan: methylPREDNISolone acetate (DEPO-MEDROL) injection 80 mg  Vitamin B12 deficiency - Plan: Vitamin B12  Acute internal derangement of right knee  History and exam, more likely acute meniscal tear. No fractures on plain films. Agree that there are small  loose bodies on plain film as well.  I placed the patient in a patellar J hinged knee brace. Ice 2 times a day for 20 minutes. Tylenol as needed for pain.  Knee Aspiration and Injection, R Patient verbally consented; risks, benefits, and alternatives explained including possible infection. Patient prepped with Chloraprep. Ethyl chloride for anesthesia. 10 cc of 1% Lidocaine used in wheal then injected Subcutaneous fashion with 22 gauge needle on lateral approach. Under sterilne conditions, 18 gauge needle used via lateral approach to aspirate 70 cc of clear, yellow fluid. Then 8 cc of Lidocaine 1% and 2 mL of Depo-Medrol 40 mg injected. Tolerated well, decreased pain,  no complications.   Follow-up: No Follow-up on file.  Meds ordered this encounter  Medications  . methylPREDNISolone acetate (DEPO-MEDROL) injection 80 mg   Signed,  Joye Wesenberg T. Wilmarie Sparlin, MD   Allergies as of 09/15/2016      Reactions   Ativan [lorazepam] Other (See Comments)   Makes "skin crawl" and insomnia   Buspar [buspirone] Nausea Only   Sweating and dizzy   Clarithromycin Swelling   Codeine    REACTION: unspecified   Doxycycline    REACTION: unspecified   Famotidine    REACTION: rash   Guaifenesin    REACTION: palpitations   Latex    REACTION: causes rash   Prednisone    REACTION: anxiety   Shellfish-derived Products    anaphylaxis   Statins Other (See Comments)   Muscle pain   Sulfonamide Derivatives    REACTION: unspecified   Tetracycline Nausea Only   REACTION: nausea   Omeprazole Nausea Only   Cough, shortness of breath      Medication List       Accurate as of 09/15/16  1:33 PM. Always use your most recent med list.          acetaminophen 325 MG tablet Commonly known as:  TYLENOL Take 650 mg by mouth every 6 (six) hours as needed.   ALPRAZolam 0.25 MG tablet Commonly known as:  XANAX TAKE ONE TABLET BY MOUTH THREE TIMES DAILY AS NEEDED   amLODipine-benazepril 5-20 MG  capsule Commonly known as:  LOTREL Take 1 capsule by mouth daily.   fluticasone 50 MCG/ACT nasal spray Commonly known as:  FLONASE Place 2 sprays into both nostrils daily.   loratadine 10 MG tablet Commonly known as:  CLARITIN Take 10 mg by mouth daily as needed.

## 2016-09-22 DIAGNOSIS — H524 Presbyopia: Secondary | ICD-10-CM | POA: Diagnosis not present

## 2016-10-06 ENCOUNTER — Telehealth: Payer: Self-pay | Admitting: Family Medicine

## 2016-10-06 DIAGNOSIS — M25561 Pain in right knee: Secondary | ICD-10-CM | POA: Diagnosis not present

## 2016-10-06 NOTE — Telephone Encounter (Signed)
Kim made copy, waiting up front for p/u.

## 2016-10-06 NOTE — Telephone Encounter (Signed)
Pt is requesting as copy of 09/10/16 of her right knee for upcoming appt today, 4/23 at 4pm.

## 2016-10-11 DIAGNOSIS — M25561 Pain in right knee: Secondary | ICD-10-CM | POA: Diagnosis not present

## 2016-10-15 DIAGNOSIS — M25561 Pain in right knee: Secondary | ICD-10-CM | POA: Diagnosis not present

## 2016-10-22 ENCOUNTER — Other Ambulatory Visit: Payer: Self-pay | Admitting: Family Medicine

## 2016-10-22 NOTE — Telephone Encounter (Signed)
Last filled 07-24-16 #90 Last OV 09-10-16 No Future OV

## 2016-10-22 NOTE — Telephone Encounter (Signed)
Left refill on voice mail at pharmacy  

## 2016-10-27 DIAGNOSIS — M1711 Unilateral primary osteoarthritis, right knee: Secondary | ICD-10-CM | POA: Diagnosis not present

## 2016-11-03 ENCOUNTER — Other Ambulatory Visit: Payer: Self-pay | Admitting: Family Medicine

## 2016-11-03 DIAGNOSIS — M1711 Unilateral primary osteoarthritis, right knee: Secondary | ICD-10-CM | POA: Diagnosis not present

## 2016-11-17 DIAGNOSIS — M1711 Unilateral primary osteoarthritis, right knee: Secondary | ICD-10-CM | POA: Diagnosis not present

## 2016-12-15 DIAGNOSIS — M25561 Pain in right knee: Secondary | ICD-10-CM | POA: Diagnosis not present

## 2016-12-22 DIAGNOSIS — M25561 Pain in right knee: Secondary | ICD-10-CM | POA: Diagnosis not present

## 2016-12-25 DIAGNOSIS — M2241 Chondromalacia patellae, right knee: Secondary | ICD-10-CM | POA: Diagnosis not present

## 2016-12-25 DIAGNOSIS — M2341 Loose body in knee, right knee: Secondary | ICD-10-CM | POA: Diagnosis not present

## 2016-12-25 DIAGNOSIS — G8918 Other acute postprocedural pain: Secondary | ICD-10-CM | POA: Diagnosis not present

## 2016-12-25 DIAGNOSIS — M1711 Unilateral primary osteoarthritis, right knee: Secondary | ICD-10-CM | POA: Diagnosis not present

## 2017-01-02 DIAGNOSIS — M25561 Pain in right knee: Secondary | ICD-10-CM | POA: Diagnosis not present

## 2017-01-02 DIAGNOSIS — Z9889 Other specified postprocedural states: Secondary | ICD-10-CM | POA: Diagnosis not present

## 2017-01-12 ENCOUNTER — Other Ambulatory Visit: Payer: Self-pay | Admitting: Family Medicine

## 2017-01-13 NOTE — Telephone Encounter (Signed)
Faxed to  [facility] - [place], [place] - [address]  [address] [place]  [address]  Phone: [phone] Fax: [phone]    

## 2017-01-13 NOTE — Telephone Encounter (Signed)
Last refill 10/22/16 Last OV 09/11/15 Ok to refill?

## 2017-01-21 ENCOUNTER — Ambulatory Visit (INDEPENDENT_AMBULATORY_CARE_PROVIDER_SITE_OTHER): Payer: Medicare HMO | Admitting: Family Medicine

## 2017-01-21 ENCOUNTER — Encounter: Payer: Self-pay | Admitting: Family Medicine

## 2017-01-21 VITALS — BP 130/90 | HR 78 | Temp 98.3°F | Ht 64.0 in | Wt 168.0 lb

## 2017-01-21 DIAGNOSIS — R300 Dysuria: Secondary | ICD-10-CM

## 2017-01-21 LAB — POC URINALSYSI DIPSTICK (AUTOMATED)
Bilirubin, UA: NEGATIVE
Blood, UA: NEGATIVE
Glucose, UA: NEGATIVE
Ketones, UA: NEGATIVE
Leukocytes, UA: NEGATIVE
Nitrite, UA: NEGATIVE
Protein, UA: NEGATIVE
Spec Grav, UA: 1.015
Urobilinogen, UA: 0.2 U/dL
pH, UA: 6

## 2017-01-21 NOTE — Progress Notes (Signed)
SUBJECTIVE: [name] is a 66 y.o. female who complains of urinary frequency, urgency x  4 days, without flank pain, fever, chills, or abnormal vaginal discharge or bleeding.   Current Outpatient Prescriptions on File Prior to Visit  Medication Sig Dispense Refill  . acetaminophen (TYLENOL) 325 MG tablet Take 650 mg by mouth every 6 (six) hours as needed.      . ALPRAZolam (XANAX) 0.25 MG tablet TAKE ONE TABLET BY MOUTH THREE TIMES DAILY AS NEEDED 90 tablet 3  . amLODipine-benazepril (LOTREL) 5-20 MG capsule Take 1 capsule by mouth daily. 30 capsule 3  . amLODipine-benazepril (LOTREL) 5-20 MG capsule TAKE ONE CAPSULE BY MOUTH DAILY 90 capsule 2  . fluticasone (FLONASE) 50 MCG/ACT nasal spray Place 2 sprays into both nostrils daily. 16 g 6  . loratadine (CLARITIN) 10 MG tablet Take 10 mg by mouth daily as needed.       No current facility-administered medications on file prior to visit.     Allergies  Allergen Reactions  . Ativan [Lorazepam] Other (See Comments)    Makes "skin crawl" and insomnia  . Buspar [Buspirone] Nausea Only    Sweating and dizzy  . Clarithromycin Swelling  . Codeine     REACTION: unspecified  . Doxycycline     REACTION: unspecified  . Famotidine     REACTION: rash  . Guaifenesin     REACTION: palpitations  . Latex     REACTION: causes rash  . Prednisone     REACTION: anxiety  . Shellfish-Derived Products     anaphylaxis  . Statins Other (See Comments)    Muscle pain  . Sulfonamide Derivatives     REACTION: unspecified  . Tetracycline Nausea Only    REACTION: nausea  . Omeprazole Nausea Only    Cough, shortness of breath    Past Medical History:  Diagnosis Date  . Anxiety   . Arnold-Chiari malformation ([place])   . Syncope and collapse    Per pt, denies passing out  . Tobacco use disorder   . Unspecified essential hypertension     Past Surgical History:  Procedure Laterality Date  . APPENDECTOMY    . ARNOLD CHIARI SURGERY     neurocranial surgery  . BLEPHAROPLASTY     Bil  . CHOLECYSTECTOMY    . MASS EXCISION Right 12/27/2013   Procedure: MINOR EXCISION OF RIGHT THUMB MUCOID CYST, DEBRIDEMENT OF INTERPHALANGEAL JOINT;  Surgeon: Cammie Sickle, MD;  Location: [facility];  Service: Orthopedics;  Laterality: Right;  . mass on thumb  right  . TUBAL LIGATION      Family History  Problem Relation Age of Onset  . Bone cancer Mother   . Heart disease Mother   . Cancer Sister        metastatic; unknown primary  . Diverticulosis Sister   . Tuberculosis Father   . Diabetes Sister   . Hypertension Sister   . Colon cancer Neg Hx   . Esophageal cancer Neg Hx   . Pancreatic cancer Neg Hx   . Stomach cancer Neg Hx   . Liver disease Neg Hx   . Kidney disease Neg Hx     Social History   Social History  . Marital status: Married    Spouse name: N/A  . Number of children: 2  . Years of education: N/A   Occupational History  . hair dresser    Social History Main Topics  . Smoking status: Current Every  Day Smoker    Packs/day: 1.00    Types: Cigarettes  . Smokeless tobacco: Never Used  . Alcohol use No  . Drug use: No  . Sexual activity: Not on file   Other Topics Concern  . Not on file   Social History Narrative  . No narrative on file   The PMH, PSH, Social History, Family History, Medications, and allergies have been reviewed in [facility], and have been updated if relevant.  OBJECTIVE: BP 130/90   Pulse 78   Temp 98.3 F (36.8 C)   Ht 5\' 4"  (1.626 m)   Wt 168 lb (76.2 kg)   SpO2 98%   BMI 28.84 kg/m   Appears well, in no apparent distress.  Vital signs are normal. The abdomen is soft without tenderness, guarding, mass, rebound or organomegaly. No CVA tenderness or inguinal adenopathy noted. Urine dipstick shows negative for all components.    ASSESSMENT: Bladder irritation  PLAN:  Frequency may be due to bladder irritants... Drink water, avoid alcohol, caffeine, soda,  citris, tomato, spicy foods.

## 2017-01-26 DIAGNOSIS — M1711 Unilateral primary osteoarthritis, right knee: Secondary | ICD-10-CM | POA: Diagnosis not present

## 2017-02-09 NOTE — Pre-Procedure Instructions (Signed)
TAMULA MORRICAL  02/09/2017      [facility] - [place], [place] - [address] [place] [address] Phone: [phone] Fax: [phone]    Your procedure is scheduled on Tuesday September 4.  Report to [facility] Admitting at 8:30 A.M.  Call this number if you have problems the morning of surgery:  [phone]   Remember:  Do not eat food or drink liquids after midnight.  Take these medicines the morning of surgery with A SIP OF WATER: loratadine (claritin) if needed, tylenol if needed  7 days prior to surgery STOP taking any Aspirin, Aleve, Naproxen, Ibuprofen, Motrin, Advil, Goody's, BC's, all herbal medications, fish oil, and all vitamins    Do not wear jewelry, make-up or nail polish.  Do not wear lotions, powders, or perfumes, or deoderant.  Do not shave 48 hours prior to surgery.  Men may shave face and neck.  Do not bring valuables to the hospital.  [facility] is not responsible for any belongings or valuables.  Contacts, dentures or bridgework may not be worn into surgery.  Leave your suitcase in the car.  After surgery it may be brought to your room.  For patients admitted to the hospital, discharge time will be determined by your treatment team.  Patients discharged the day of surgery will not be allowed to drive home.    Special instructions:    [facility]- Preparing For Surgery  Before surgery, you can play an important role. Because skin is not sterile, your skin needs to be as free of germs as possible. You can reduce the number of germs on your skin by washing with CHG (chlorahexidine gluconate) Soap before surgery.  CHG is an antiseptic cleaner which kills germs and bonds with the skin to continue killing germs even after washing.  Please do not use if you have an allergy to CHG or antibacterial soaps. If your skin becomes reddened/irritated stop using the CHG.  Do not shave (including legs  and underarms) for at least 48 hours prior to first CHG shower. It is OK to shave your face.  Please follow these instructions carefully.   1. Shower the NIGHT BEFORE SURGERY and the MORNING OF SURGERY with CHG.   2. If you chose to wash your hair, wash your hair first as usual with your normal shampoo.  3. After you shampoo, rinse your hair and body thoroughly to remove the shampoo.  4. Use CHG as you would any other liquid soap. You can apply CHG directly to the skin and wash gently with a scrungie or a clean washcloth.   5. Apply the CHG Soap to your body ONLY FROM THE NECK DOWN.  Do not use on open wounds or open sores. Avoid contact with your eyes, ears, mouth and genitals (private parts). Wash genitals (private parts) with your normal soap.  6. Wash thoroughly, paying special attention to the area where your surgery will be performed.  7. Thoroughly rinse your body with warm water from the neck down.  8. DO NOT shower/wash with your normal soap after using and rinsing off the CHG Soap.  9. Pat yourself dry with a CLEAN TOWEL.   10. Wear CLEAN PAJAMAS   11. Place CLEAN SHEETS on your bed the night of your first shower and DO NOT SLEEP WITH PETS.    Day of Surgery: Do not apply any deodorants/lotions. Please wear clean clothes to  the hospital/surgery center.       Please read over the following fact sheets that you were given. Total Joint Packet and MRSA Information

## 2017-02-10 ENCOUNTER — Encounter (HOSPITAL_COMMUNITY)
Admission: RE | Admit: 2017-02-10 | Discharge: 2017-02-10 | Disposition: A | Discharge status: Home / Self Care | Payer: Medicare HMO | Source: Ambulatory Visit | Attending: Orthopaedic Surgery | Admitting: Orthopaedic Surgery

## 2017-02-10 ENCOUNTER — Encounter (HOSPITAL_COMMUNITY): Payer: Self-pay

## 2017-02-10 DIAGNOSIS — M1711 Unilateral primary osteoarthritis, right knee: Secondary | ICD-10-CM | POA: Diagnosis not present

## 2017-02-10 DIAGNOSIS — I700 Atherosclerosis of aorta: Secondary | ICD-10-CM | POA: Insufficient documentation

## 2017-02-10 DIAGNOSIS — Z01812 Encounter for preprocedural laboratory examination: Secondary | ICD-10-CM | POA: Insufficient documentation

## 2017-02-10 DIAGNOSIS — Z01818 Encounter for other preprocedural examination: Secondary | ICD-10-CM

## 2017-02-10 DIAGNOSIS — R69 Illness, unspecified: Secondary | ICD-10-CM | POA: Diagnosis not present

## 2017-02-10 DIAGNOSIS — F17200 Nicotine dependence, unspecified, uncomplicated: Secondary | ICD-10-CM | POA: Insufficient documentation

## 2017-02-10 DIAGNOSIS — Z01810 Encounter for preprocedural cardiovascular examination: Secondary | ICD-10-CM | POA: Insufficient documentation

## 2017-02-10 HISTORY — DX: Unspecified osteoarthritis, unspecified site: M19.90

## 2017-02-10 HISTORY — DX: Other specified bacterial intestinal infections: A04.8

## 2017-02-10 HISTORY — DX: Unspecified urinary incontinence: R32

## 2017-02-10 LAB — PROTIME-INR
INR: 0.93
Prothrombin Time: 12.4 s (ref 11.4–15.2)

## 2017-02-10 LAB — BASIC METABOLIC PANEL WITH GFR
Anion gap: 9 (ref 5–15)
BUN: 7 mg/dL (ref 6–20)
CO2: 24 mmol/L (ref 22–32)
Calcium: 9 mg/dL (ref 8.9–10.3)
Chloride: 107 mmol/L (ref 101–111)
Creatinine, Ser: 0.99 mg/dL (ref 0.44–1.00)
GFR calc Af Amer: >60 mL/min
GFR calc non Af Amer: 58 mL/min — ABNORMAL LOW
Glucose, Bld: 94 mg/dL (ref 65–99)
Potassium: 3.5 mmol/L (ref 3.5–5.1)
Sodium: 140 mmol/L (ref 135–145)

## 2017-02-10 LAB — APTT: aPTT: 34 s (ref 24–36)

## 2017-02-10 LAB — CBC WITH DIFFERENTIAL/PLATELET
Basophils Absolute: 0 K/uL (ref 0.0–0.1)
Basophils Relative: 0 %
Eosinophils Absolute: 0.1 K/uL (ref 0.0–0.7)
Eosinophils Relative: 1 %
HCT: 42.7 % (ref 36.0–46.0)
Hemoglobin: 13.9 g/dL (ref 12.0–15.0)
Lymphocytes Relative: 25 %
Lymphs Abs: 2.2 K/uL (ref 0.7–4.0)
MCH: 29.6 pg (ref 26.0–34.0)
MCHC: 32.6 g/dL (ref 30.0–36.0)
MCV: 91 fL (ref 78.0–100.0)
Monocytes Absolute: 0.6 K/uL (ref 0.1–1.0)
Monocytes Relative: 7 %
Neutro Abs: 5.8 K/uL (ref 1.7–7.7)
Neutrophils Relative %: 67 %
Platelets: 281 K/uL (ref 150–400)
RBC: 4.69 MIL/uL (ref 3.87–5.11)
RDW: 13.6 % (ref 11.5–15.5)
WBC: 8.7 K/uL (ref 4.0–10.5)

## 2017-02-10 LAB — URINALYSIS, ROUTINE W REFLEX MICROSCOPIC
Bilirubin Urine: NEGATIVE
Glucose, UA: NEGATIVE mg/dL
Hgb urine dipstick: NEGATIVE
Ketones, ur: NEGATIVE mg/dL
Leukocytes, UA: NEGATIVE
Nitrite: NEGATIVE
Protein, ur: NEGATIVE mg/dL
Specific Gravity, Urine: <1.005 — ABNORMAL LOW (ref 1.005–1.030)
pH: 6 (ref 5.0–8.0)

## 2017-02-10 LAB — SURGICAL PCR SCREEN
MRSA, PCR: NEGATIVE
Staphylococcus aureus: NEGATIVE

## 2017-02-10 LAB — TYPE AND SCREEN
ABO/RH(D): O POS
Antibody Screen: NEGATIVE

## 2017-02-10 LAB — ABO/RH: ABO/RH(D): O POS

## 2017-02-10 NOTE — Progress Notes (Signed)
PCP - Arnette Norris Cardiologist - none, denies cardiac history  Chest x-ray - 02/10/2017  EKG - 02/10/2017   Patient denies shortness of breath, fever, cough and chest pain at PAT appointment   Patient verbalized understanding of instructions that were given to them at the PAT appointment. Patient was also instructed that they will need to review over the PAT instructions again at home before surgery.

## 2017-02-13 NOTE — H&P (Signed)
TOTAL KNEE ADMISSION H&P  Patient is being admitted for right total knee arthroplasty.  Subjective:  Chief Complaint:right knee pain.  HPI: [name], 66 y.o. female, has a history of pain and functional disability in the right knee due to arthritis and has failed non-surgical conservative treatments for greater than 12 weeks to includeNSAID's and/or analgesics, corticosteriod injections, flexibility and strengthening excercises, supervised PT with diminished ADL's post treatment, use of assistive devices, weight reduction as appropriate and activity modification.  Onset of symptoms was gradual, starting 5 years ago with gradually worsening course since that time. The patient noted prior procedures on the knee to include  arthroscopy on the right knee(s).  Patient currently rates pain in the right knee(s) at 10 out of 10 with activity. Patient has night pain, worsening of pain with activity and weight bearing, pain that interferes with activities of daily living, crepitus and joint swelling.  Patient has evidence of subchondral cysts, subchondral sclerosis, periarticular osteophytes and joint space narrowing by imaging studies. There is no active infection.  Patient Active Problem List   Diagnosis Date Noted  . Vitamin B12 deficiency 09/10/2015  . Insomnia 07/16/2015  . H. pylori infection 04/11/2015  . GERD (gastroesophageal reflux disease) 03/26/2015  . Fatigue 10/02/2014  . Other malaise and fatigue 02/08/2014  . HLD (hyperlipidemia) 11/19/2011  . Vaginal dryness, menopausal 11/19/2011  . Adjustment disorder 01/08/2011  . TOBACCO ABUSE 12/19/2008  . Essential hypertension 12/19/2008   Past Medical History:  Diagnosis Date  . Anxiety   . Arnold-Chiari malformation ([place])   . Arthritis   . H. pylori infection    3 years ago  . Incontinence   . Syncope and collapse    Per pt, denies passing out  . Tobacco use disorder   . Unspecified essential hypertension     Past Surgical  History:  Procedure Laterality Date  . APPENDECTOMY    . ARNOLD CHIARI SURGERY     neurocranial surgery  . BLEPHAROPLASTY     Bil  . CHOLECYSTECTOMY    . MASS EXCISION Right 12/27/2013   Procedure: MINOR EXCISION OF RIGHT THUMB MUCOID CYST, DEBRIDEMENT OF INTERPHALANGEAL JOINT;  Surgeon: Cammie Sickle, MD;  Location: [facility];  Service: Orthopedics;  Laterality: Right;  . mass on thumb  right  . TUBAL LIGATION      No prescriptions prior to admission.   Allergies  Allergen Reactions  . Shellfish-Derived Products Anaphylaxis  . Ativan [Lorazepam] Other (See Comments)    Makes "skin crawl" and insomnia  . Buspar [Buspirone] Nausea Only    Sweating and dizzy  . Clarithromycin Swelling  . Codeine Nausea And Vomiting  . Doxycycline Other (See Comments)    Unknown  . Guaifenesin Other (See Comments)    Palpitations  . Oxycodone Nausea And Vomiting  . Prednisone Nausea And Vomiting and Other (See Comments)    Makes my heart race  . Statins Other (See Comments)    Muscle pain  . Sulfonamide Derivatives Nausea And Vomiting    Achiness  . Tetracycline Nausea Only  . Famotidine Rash  . Latex Rash  . Omeprazole Nausea Only    Cough, shortness of breath - patient doesn't remember  . Peanut-Containing Drug Products Itching and Rash    Social History  Substance Use Topics  . Smoking status: Current Every Day Smoker    Packs/day: 1.00    Types: Cigarettes  . Smokeless tobacco: Never Used  . Alcohol use No  Family History  Problem Relation Age of Onset  . Bone cancer Mother   . Heart disease Mother   . Cancer Sister        metastatic; unknown primary  . Diverticulosis Sister   . Tuberculosis Father   . Diabetes Sister   . Hypertension Sister   . Colon cancer Neg Hx   . Esophageal cancer Neg Hx   . Pancreatic cancer Neg Hx   . Stomach cancer Neg Hx   . Liver disease Neg Hx   . Kidney disease Neg Hx      Review of Systems  Musculoskeletal:  Positive for joint pain.       Right knee  All other systems reviewed and are negative.   Objective:  Physical Exam  Constitutional: She is oriented to person, place, and time. She appears well-developed and well-nourished.  HENT:  Head: Normocephalic and atraumatic.  Eyes: Pupils are equal, round, and reactive to light.  Neck: Normal range of motion.  Cardiovascular: Normal rate and regular rhythm.   Respiratory: Effort normal.  GI: Soft.  Musculoskeletal:  Right knee again has 1+ effusion.  Her motion is 0-120.  She has pain along the medial joint line and through the medial patellar facet.  There is some crepitation.  Hip motion is full and straight leg raise is negative.  Neurological: She is alert and oriented to person, place, and time.  Skin: Skin is warm and dry.  Psychiatric: She has a normal mood and affect. Her behavior is normal. Judgment and thought content normal.    Vital signs in last 24 hours:    Labs:   Estimated body mass index is 28.89 kg/m as calculated from the following:   Height as of 02/10/17: 5\' 4"  (1.626 m).   Weight as of 02/10/17: 76.3 kg (168 lb 4.8 oz).   Imaging Review Plain radiographs demonstrate severe degenerative joint disease of the right knee(s). The overall alignment isneutral. The bone quality appears to be good for age and reported activity level.  Assessment/Plan:  End stage primary arthritis, right knee   The patient history, physical examination, clinical judgment of the provider and imaging studies are consistent with end stage degenerative joint disease of the right knee(s) and total knee arthroplasty is deemed medically necessary. The treatment options including medical management, injection therapy arthroscopy and arthroplasty were discussed at length. The risks and benefits of total knee arthroplasty were presented and reviewed. The risks due to aseptic loosening, infection, stiffness, patella tracking problems, thromboembolic  complications and other imponderables were discussed. The patient acknowledged the explanation, agreed to proceed with the plan and consent was signed. Patient is being admitted for inpatient treatment for surgery, pain control, PT, OT, prophylactic antibiotics, VTE prophylaxis, progressive ambulation and ADL's and discharge planning. The patient is planning to be discharged home with home health services

## 2017-02-15 MED ORDER — TRANEXAMIC ACID 1000 MG/10ML IV SOLN
2000.0000 mg | INTRAVENOUS | Status: DC
  Filled 2017-02-15: qty 20

## 2017-02-17 ENCOUNTER — Inpatient Hospital Stay (HOSPITAL_COMMUNITY)
Admission: RE | Admit: 2017-02-17 | Discharge: 2017-02-19 | DRG: 470 | Disposition: A | Discharge status: Home Health | Payer: Medicare HMO | Source: Ambulatory Visit | Attending: Orthopaedic Surgery | Admitting: Orthopaedic Surgery

## 2017-02-17 ENCOUNTER — Encounter (HOSPITAL_COMMUNITY): Payer: Self-pay | Admitting: *Deleted

## 2017-02-17 ENCOUNTER — Encounter (HOSPITAL_COMMUNITY)
Admission: RE | Disposition: A | Discharge status: Home Health | Payer: Self-pay | Source: Ambulatory Visit | Attending: Orthopaedic Surgery

## 2017-02-17 ENCOUNTER — Inpatient Hospital Stay (HOSPITAL_COMMUNITY): Payer: Medicare HMO | Admitting: Certified Registered Nurse Anesthetist

## 2017-02-17 DIAGNOSIS — M1711 Unilateral primary osteoarthritis, right knee: Secondary | ICD-10-CM | POA: Diagnosis not present

## 2017-02-17 DIAGNOSIS — Q0700 Arnold-Chiari syndrome without spina bifida or hydrocephalus: Secondary | ICD-10-CM | POA: Diagnosis not present

## 2017-02-17 DIAGNOSIS — Z7982 Long term (current) use of aspirin: Secondary | ICD-10-CM | POA: Diagnosis not present

## 2017-02-17 DIAGNOSIS — Z882 Allergy status to sulfonamides status: Secondary | ICD-10-CM

## 2017-02-17 DIAGNOSIS — Z881 Allergy status to other antibiotic agents status: Secondary | ICD-10-CM | POA: Diagnosis not present

## 2017-02-17 DIAGNOSIS — Z79899 Other long term (current) drug therapy: Secondary | ICD-10-CM | POA: Diagnosis not present

## 2017-02-17 DIAGNOSIS — E785 Hyperlipidemia, unspecified: Secondary | ICD-10-CM | POA: Diagnosis not present

## 2017-02-17 DIAGNOSIS — Z888 Allergy status to other drugs, medicaments and biological substances status: Secondary | ICD-10-CM

## 2017-02-17 DIAGNOSIS — F419 Anxiety disorder, unspecified: Secondary | ICD-10-CM | POA: Diagnosis present

## 2017-02-17 DIAGNOSIS — F17210 Nicotine dependence, cigarettes, uncomplicated: Secondary | ICD-10-CM | POA: Diagnosis present

## 2017-02-17 DIAGNOSIS — G4700 Insomnia, unspecified: Secondary | ICD-10-CM | POA: Diagnosis not present

## 2017-02-17 DIAGNOSIS — Z885 Allergy status to narcotic agent status: Secondary | ICD-10-CM

## 2017-02-17 DIAGNOSIS — I10 Essential (primary) hypertension: Secondary | ICD-10-CM | POA: Diagnosis not present

## 2017-02-17 DIAGNOSIS — K219 Gastro-esophageal reflux disease without esophagitis: Secondary | ICD-10-CM | POA: Diagnosis present

## 2017-02-17 DIAGNOSIS — R69 Illness, unspecified: Secondary | ICD-10-CM | POA: Diagnosis not present

## 2017-02-17 DIAGNOSIS — M25561 Pain in right knee: Secondary | ICD-10-CM | POA: Diagnosis present

## 2017-02-17 DIAGNOSIS — Z96651 Presence of right artificial knee joint: Secondary | ICD-10-CM | POA: Diagnosis not present

## 2017-02-17 DIAGNOSIS — G8918 Other acute postprocedural pain: Secondary | ICD-10-CM | POA: Diagnosis not present

## 2017-02-17 HISTORY — PX: TOTAL KNEE ARTHROPLASTY: SHX125

## 2017-02-17 SURGERY — ARTHROPLASTY, KNEE, TOTAL
Anesthesia: Regional | Site: Knee | Laterality: Right

## 2017-02-17 MED ORDER — LACTATED RINGERS IV SOLN
INTRAVENOUS | Status: DC
  Administered 2017-02-17: 09:00:00 via INTRAVENOUS

## 2017-02-17 MED ORDER — ASPIRIN EC 325 MG PO TBEC
325.0000 mg | DELAYED_RELEASE_TABLET | Freq: Two times a day (BID) | ORAL | Status: DC
  Administered 2017-02-18 – 2017-02-19 (×3): 325 mg via ORAL
  Filled 2017-02-17 (×3): qty 1

## 2017-02-17 MED ORDER — ALUM & MAG HYDROXIDE-SIMETH 200-200-20 MG/5ML PO SUSP: 30.0000 mL | ORAL | Status: DC | PRN

## 2017-02-17 MED ORDER — BUPIVACAINE LIPOSOME 1.3 % IJ SUSP
20.0000 mL | INTRAMUSCULAR | Status: DC
  Filled 2017-02-17: qty 20

## 2017-02-17 MED ORDER — HYDROCODONE-ACETAMINOPHEN 5-325 MG PO TABS
1.0000 | ORAL_TABLET | ORAL | Status: DC | PRN
  Administered 2017-02-17 (×2): 2 via ORAL
  Filled 2017-02-17 (×2): qty 2

## 2017-02-17 MED ORDER — MIDAZOLAM HCL 2 MG/2ML IJ SOLN
INTRAMUSCULAR | Status: AC
  Administered 2017-02-17: 2 mg
  Filled 2017-02-17: qty 2

## 2017-02-17 MED ORDER — FENTANYL CITRATE (PF) 250 MCG/5ML IJ SOLN
INTRAMUSCULAR | Status: AC
  Filled 2017-02-17: qty 5

## 2017-02-17 MED ORDER — CEFAZOLIN SODIUM-DEXTROSE 2-4 GM/100ML-% IV SOLN
2.0000 g | Freq: Four times a day (QID) | INTRAVENOUS | Status: AC
  Administered 2017-02-17 (×2): 2 g via INTRAVENOUS
  Filled 2017-02-17 (×2): qty 100

## 2017-02-17 MED ORDER — 0.9 % SODIUM CHLORIDE (POUR BTL) OPTIME
TOPICAL | Status: DC | PRN
  Administered 2017-02-17: 1000 mL

## 2017-02-17 MED ORDER — DIPHENHYDRAMINE HCL 12.5 MG/5ML PO ELIX
12.5000 mg | ORAL_SOLUTION | ORAL | Status: DC | PRN
Start: 2017-02-17 — End: 2017-02-19

## 2017-02-17 MED ORDER — HYDROMORPHONE HCL 1 MG/ML IJ SOLN: 0.5000 mg | INTRAMUSCULAR | Status: DC | PRN

## 2017-02-17 MED ORDER — FENTANYL CITRATE (PF) 100 MCG/2ML IJ SOLN: 25.0000 ug | INTRAMUSCULAR | Status: DC | PRN

## 2017-02-17 MED ORDER — LACTATED RINGERS IV SOLN: INTRAVENOUS | Status: DC

## 2017-02-17 MED ORDER — DOCUSATE SODIUM 100 MG PO CAPS
100.0000 mg | ORAL_CAPSULE | Freq: Two times a day (BID) | ORAL | Status: DC
  Administered 2017-02-17 – 2017-02-19 (×5): 100 mg via ORAL
  Filled 2017-02-17 (×5): qty 1

## 2017-02-17 MED ORDER — FENTANYL CITRATE (PF) 100 MCG/2ML IJ SOLN
INTRAMUSCULAR | Status: AC
  Filled 2017-02-17: qty 2

## 2017-02-17 MED ORDER — ACETAMINOPHEN 325 MG PO TABS
650.0000 mg | ORAL_TABLET | Freq: Four times a day (QID) | ORAL | Status: DC | PRN
  Administered 2017-02-17 – 2017-02-19 (×4): 650 mg via ORAL
  Filled 2017-02-17 (×4): qty 2

## 2017-02-17 MED ORDER — ALPRAZOLAM 0.25 MG PO TABS
0.2500 mg | ORAL_TABLET | Freq: Every evening | ORAL | Status: DC | PRN
  Administered 2017-02-17: 0.25 mg via ORAL
  Filled 2017-02-17: qty 1

## 2017-02-17 MED ORDER — METOCLOPRAMIDE HCL 5 MG PO TABS: 5.0000 mg | ORAL_TABLET | Freq: Three times a day (TID) | ORAL | Status: DC | PRN

## 2017-02-17 MED ORDER — BUPIVACAINE LIPOSOME 1.3 % IJ SUSP
INTRAMUSCULAR | Status: DC | PRN
  Administered 2017-02-17: 20 mL

## 2017-02-17 MED ORDER — CEFAZOLIN SODIUM-DEXTROSE 2-4 GM/100ML-% IV SOLN
2.0000 g | INTRAVENOUS | Status: AC
  Administered 2017-02-17: 2 g via INTRAVENOUS

## 2017-02-17 MED ORDER — ONDANSETRON HCL 4 MG PO TABS: 4.0000 mg | ORAL_TABLET | Freq: Four times a day (QID) | ORAL | Status: DC | PRN

## 2017-02-17 MED ORDER — CHLORHEXIDINE GLUCONATE 4 % EX LIQD: 60.0000 mL | Freq: Once | CUTANEOUS | Status: DC

## 2017-02-17 MED ORDER — ONDANSETRON HCL 4 MG/2ML IJ SOLN
INTRAMUSCULAR | Status: DC | PRN
  Administered 2017-02-17: 4 mg via INTRAVENOUS

## 2017-02-17 MED ORDER — BENAZEPRIL HCL 20 MG PO TABS
20.0000 mg | ORAL_TABLET | Freq: Every day | ORAL | Status: DC
  Administered 2017-02-17 – 2017-02-19 (×3): 20 mg via ORAL
  Filled 2017-02-17 (×3): qty 1

## 2017-02-17 MED ORDER — ONDANSETRON HCL 4 MG/2ML IJ SOLN
4.0000 mg | Freq: Four times a day (QID) | INTRAMUSCULAR | Status: DC | PRN
  Administered 2017-02-17: 4 mg via INTRAVENOUS
  Filled 2017-02-17 (×2): qty 2

## 2017-02-17 MED ORDER — ROPIVACAINE HCL 5 MG/ML IJ SOLN
INTRAMUSCULAR | Status: DC | PRN
  Administered 2017-02-17: 30 mL via PERINEURAL

## 2017-02-17 MED ORDER — PHENOL 1.4 % MT LIQD: 1.0000 | OROMUCOSAL | Status: DC | PRN

## 2017-02-17 MED ORDER — LACTATED RINGERS IV SOLN
INTRAVENOUS | Status: DC
  Administered 2017-02-17: 14:00:00 via INTRAVENOUS

## 2017-02-17 MED ORDER — MENTHOL 3 MG MT LOZG: 1.0000 | LOZENGE | OROMUCOSAL | Status: DC | PRN

## 2017-02-17 MED ORDER — LORATADINE 10 MG PO TABS: 10.0000 mg | ORAL_TABLET | Freq: Every day | ORAL | Status: DC | PRN

## 2017-02-17 MED ORDER — PHENYLEPHRINE HCL 10 MG/ML IJ SOLN
INTRAVENOUS | Status: DC | PRN
  Administered 2017-02-17: 20 ug/min via INTRAVENOUS

## 2017-02-17 MED ORDER — BUPIVACAINE IN DEXTROSE 0.75-8.25 % IT SOLN
INTRATHECAL | Status: DC | PRN
  Administered 2017-02-17: 1.8 mL via INTRATHECAL

## 2017-02-17 MED ORDER — AMLODIPINE BESYLATE 5 MG PO TABS
5.0000 mg | ORAL_TABLET | Freq: Every day | ORAL | Status: DC
  Administered 2017-02-17 – 2017-02-19 (×3): 5 mg via ORAL
  Filled 2017-02-17 (×3): qty 1

## 2017-02-17 MED ORDER — ACETAMINOPHEN 650 MG RE SUPP: 650.0000 mg | Freq: Four times a day (QID) | RECTAL | Status: DC | PRN

## 2017-02-17 MED ORDER — TRANEXAMIC ACID 1000 MG/10ML IV SOLN
1000.0000 mg | Freq: Once | INTRAVENOUS | Status: AC
  Administered 2017-02-17: 1000 mg via INTRAVENOUS
  Filled 2017-02-17: qty 10

## 2017-02-17 MED ORDER — MIDAZOLAM HCL 2 MG/2ML IJ SOLN
INTRAMUSCULAR | Status: AC
  Filled 2017-02-17: qty 2

## 2017-02-17 MED ORDER — LIDOCAINE 2% (20 MG/ML) 5 ML SYRINGE
INTRAMUSCULAR | Status: DC | PRN
  Administered 2017-02-17: 60 mg via INTRAVENOUS

## 2017-02-17 MED ORDER — METHOCARBAMOL 1000 MG/10ML IJ SOLN: 500.0000 mg | Freq: Four times a day (QID) | INTRAVENOUS | Status: DC | PRN

## 2017-02-17 MED ORDER — FENTANYL CITRATE (PF) 100 MCG/2ML IJ SOLN
INTRAMUSCULAR | Status: DC | PRN
  Administered 2017-02-17 (×2): 25 ug via INTRAVENOUS

## 2017-02-17 MED ORDER — DEXAMETHASONE SODIUM PHOSPHATE 10 MG/ML IJ SOLN
INTRAMUSCULAR | Status: DC | PRN
  Administered 2017-02-17: 5 mg via INTRAVENOUS

## 2017-02-17 MED ORDER — TRANEXAMIC ACID 1000 MG/10ML IV SOLN
1000.0000 mg | INTRAVENOUS | Status: AC
  Administered 2017-02-17: 1000 mg via INTRAVENOUS
  Filled 2017-02-17: qty 1100

## 2017-02-17 MED ORDER — BISACODYL 5 MG PO TBEC: 5.0000 mg | DELAYED_RELEASE_TABLET | Freq: Every day | ORAL | Status: DC | PRN

## 2017-02-17 MED ORDER — METOCLOPRAMIDE HCL 5 MG/ML IJ SOLN: 5.0000 mg | Freq: Three times a day (TID) | INTRAMUSCULAR | Status: DC | PRN

## 2017-02-17 MED ORDER — PROPOFOL 500 MG/50ML IV EMUL
INTRAVENOUS | Status: DC | PRN
  Administered 2017-02-17: 40 ug/kg/min via INTRAVENOUS

## 2017-02-17 MED ORDER — DEXAMETHASONE SODIUM PHOSPHATE 10 MG/ML IJ SOLN
INTRAMUSCULAR | Status: AC
  Filled 2017-02-17: qty 1

## 2017-02-17 MED ORDER — BUPIVACAINE-EPINEPHRINE (PF) 0.5% -1:200000 IJ SOLN
INTRAMUSCULAR | Status: AC
  Filled 2017-02-17: qty 30

## 2017-02-17 MED ORDER — ONDANSETRON HCL 4 MG/2ML IJ SOLN: 4.0000 mg | Freq: Once | INTRAMUSCULAR | Status: DC | PRN

## 2017-02-17 MED ORDER — BUPIVACAINE-EPINEPHRINE (PF) 0.5% -1:200000 IJ SOLN
INTRAMUSCULAR | Status: DC | PRN
  Administered 2017-02-17: 20 mL

## 2017-02-17 MED ORDER — METHOCARBAMOL 500 MG PO TABS
500.0000 mg | ORAL_TABLET | Freq: Four times a day (QID) | ORAL | Status: DC | PRN
  Administered 2017-02-17 – 2017-02-18 (×3): 500 mg via ORAL
  Filled 2017-02-17 (×3): qty 1

## 2017-02-17 MED ORDER — SODIUM CHLORIDE 0.9 % IR SOLN
Status: DC | PRN
  Administered 2017-02-17: 3000 mL

## 2017-02-17 MED ORDER — CEFAZOLIN SODIUM-DEXTROSE 2-4 GM/100ML-% IV SOLN
INTRAVENOUS | Status: AC
  Filled 2017-02-17: qty 100

## 2017-02-17 MED ORDER — ONDANSETRON HCL 4 MG/2ML IJ SOLN
INTRAMUSCULAR | Status: AC
  Filled 2017-02-17: qty 8

## 2017-02-17 MED ORDER — SODIUM CHLORIDE 0.9 % IJ SOLN
INTRAMUSCULAR | Status: DC | PRN
  Administered 2017-02-17: 20 mL

## 2017-02-17 MED ORDER — AMLODIPINE BESY-BENAZEPRIL HCL 5-20 MG PO CAPS: 1.0000 | ORAL_CAPSULE | Freq: Every day | ORAL | Status: DC

## 2017-02-17 SURGICAL SUPPLY — 55 items
BAG DECANTER FOR FLEXI CONT (MISCELLANEOUS) IMPLANT
BANDAGE ACE 6X5 VEL STRL LF (GAUZE/BANDAGES/DRESSINGS) ×1 IMPLANT
BANDAGE ESMARK 6X9 LF (GAUZE/BANDAGES/DRESSINGS) ×1 IMPLANT
BLADE SAGITTAL 25.0X1.19X90 (BLADE) IMPLANT
BLADE SAW SGTL 13.0X1.19X90.0M (BLADE) IMPLANT
BNDG CMPR 9X6 STRL LF SNTH (GAUZE/BANDAGES/DRESSINGS) ×1
BNDG CMPR MED 10X6 ELC LF (GAUZE/BANDAGES/DRESSINGS) ×1
BNDG ELASTIC 6X10 VLCR STRL LF (GAUZE/BANDAGES/DRESSINGS) ×2 IMPLANT
BNDG ESMARK 6X9 LF (GAUZE/BANDAGES/DRESSINGS) ×2
BOWL SMART MIX CTS (DISPOSABLE) ×2 IMPLANT
CAPT KNEE TOTAL 3 ATTUNE ×1 IMPLANT
CEMENT HV SMART SET (Cement) ×3 IMPLANT
CLSR STERI-STRIP ANTIMIC 1/2X4 (GAUZE/BANDAGES/DRESSINGS) ×1 IMPLANT
COVER SURGICAL LIGHT HANDLE (MISCELLANEOUS) ×2 IMPLANT
CUFF TOURNIQUET SINGLE 34IN LL (TOURNIQUET CUFF) ×2 IMPLANT
CUFF TOURNIQUET SINGLE 44IN (TOURNIQUET CUFF) IMPLANT
DECANTER SPIKE VIAL GLASS SM (MISCELLANEOUS) ×2 IMPLANT
DRAPE EXTREMITY T 121X128X90 (DRAPE) ×2 IMPLANT
DRAPE HALF SHEET 40X57 (DRAPES) ×4 IMPLANT
DRAPE U-SHAPE 47X51 STRL (DRAPES) ×2 IMPLANT
DRSG AQUACEL AG ADV 3.5X10 (GAUZE/BANDAGES/DRESSINGS) ×2 IMPLANT
DURAPREP 26ML APPLICATOR (WOUND CARE) ×2 IMPLANT
ELECT REM PT RETURN 9FT ADLT (ELECTROSURGICAL) ×2
ELECTRODE REM PT RTRN 9FT ADLT (ELECTROSURGICAL) ×1 IMPLANT
GLOVE BIO SURGEON STRL SZ8 (GLOVE) ×4 IMPLANT
GLOVE BIOGEL PI IND STRL 8 (GLOVE) ×2 IMPLANT
GLOVE BIOGEL PI INDICATOR 8 (GLOVE) ×2
GOWN STRL REUS W/ TWL LRG LVL3 (GOWN DISPOSABLE) ×1 IMPLANT
GOWN STRL REUS W/ TWL XL LVL3 (GOWN DISPOSABLE) ×2 IMPLANT
GOWN STRL REUS W/TWL LRG LVL3 (GOWN DISPOSABLE) ×2
GOWN STRL REUS W/TWL XL LVL3 (GOWN DISPOSABLE) ×4
HANDPIECE INTERPULSE COAX TIP (DISPOSABLE) ×2
HOOD PEEL AWAY FACE SHEILD DIS (HOOD) ×4 IMPLANT
IMMOBILIZER KNEE 22 (SOFTGOODS) ×1 IMPLANT
IMMOBILIZER KNEE 22 UNIV (SOFTGOODS) ×2 IMPLANT
KIT BASIN OR (CUSTOM PROCEDURE TRAY) ×2 IMPLANT
KIT ROOM TURNOVER OR (KITS) ×2 IMPLANT
MANIFOLD NEPTUNE II (INSTRUMENTS) ×2 IMPLANT
NDL HYPO 21X1 ECLIPSE (NEEDLE) ×1 IMPLANT
NEEDLE HYPO 21X1 ECLIPSE (NEEDLE) ×2 IMPLANT
NS IRRIG 1000ML POUR BTL (IV SOLUTION) ×2 IMPLANT
PACK TOTAL JOINT (CUSTOM PROCEDURE TRAY) ×2 IMPLANT
PAD ARMBOARD 7.5X6 YLW CONV (MISCELLANEOUS) ×4 IMPLANT
SET HNDPC FAN SPRY TIP SCT (DISPOSABLE) ×1 IMPLANT
STRIP CLOSURE SKIN 1/2X4 (GAUZE/BANDAGES/DRESSINGS) ×2 IMPLANT
SUT VIC AB 0 CT1 27 (SUTURE) ×2
SUT VIC AB 0 CT1 27XBRD ANBCTR (SUTURE) ×1 IMPLANT
SUT VIC AB 2-0 CT1 27 (SUTURE) ×2
SUT VIC AB 2-0 CT1 TAPERPNT 27 (SUTURE) ×1 IMPLANT
SUT VIC AB 3-0 FS2 27 (SUTURE) ×2 IMPLANT
SUT VLOC 180 0 24IN GS25 (SUTURE) ×2 IMPLANT
SYR 50ML LL SCALE MARK (SYRINGE) ×2 IMPLANT
TOWEL OR 17X24 6PK STRL BLUE (TOWEL DISPOSABLE) ×2 IMPLANT
TOWEL OR 17X26 10 PK STRL BLUE (TOWEL DISPOSABLE) ×2 IMPLANT
TRAY CATH 16FR W/PLASTIC CATH (SET/KITS/TRAYS/PACK) IMPLANT

## 2017-02-17 NOTE — Progress Notes (Signed)
RPT Cancellation Note  Patient Details Name: [name] MRN: [id number] DOB: 1950-12-16   Cancelled Treatment:    Reason Eval/Treat Not Completed: Pain limiting ability to participate RN requesting for PT to check back later once she gets pain meds. Will reattempt as schedule allows.   Leighton Ruff, PT, DPT  Acute Rehabilitation Services  Pager: [phone]    Rudean Hitt 02/17/2017, 2:10 PM

## 2017-02-17 NOTE — Progress Notes (Signed)
Orthopedic Tech Progress Note Patient Details:  [name] [birth year] [id number]  CPM Right Knee CPM Right Knee: On Right Knee Flexion (Degrees): 90 Right Knee Extension (Degrees): 0 Additional Comments: foot roll   [place] Pink 02/17/2017, 1:11 PM

## 2017-02-17 NOTE — Op Note (Signed)
PREOP DIAGNOSIS: DJD RIGHT KNEE POSTOP DIAGNOSIS: same PROCEDURE: RIGHT TKR ANESTHESIA: Spinal and block and MAC ATTENDING SURGEON: Lorena Clearman G ASSISTANTLoni Dolly PA  INDICATIONS FOR PROCEDURE: [name] is a 66 y.o. female who has struggled for a long time with pain due to degenerative arthritis of the right knee.  The patient has failed many conservative non-operative measures and at this point has pain which limits the ability to sleep and walk.  The patient is offered total knee replacement.  Informed operative consent was obtained after discussion of possible risks of anesthesia, infection, neurovascular injury, DVT, and death.  The importance of the post-operative rehabilitation protocol to optimize result was stressed extensively with the patient.  SUMMARY OF FINDINGS AND PROCEDURE:  [name] was taken to the operative suite where under the above anesthesia a right knee replacement was performed.  There were advanced degenerative changes and the bone quality was good.  We used the DePuy Attune system and placed size 6 narrow femur, 5 tibia, 35 mm all polyethylene patella, and a size 5 mm spacer.  Loni Dolly PA-C assisted throughout and was invaluable to the completion of the case in that he helped retract and maintain exposure while I placed components.  He also helped close thereby minimizing OR time.  The patient was admitted for appropriate post-op care to include perioperative antibiotics and mechanical and pharmacologic measures for DVT prophylaxis.  DESCRIPTION OF PROCEDURE:  [name] was taken to the operative suite where the above anesthesia was applied.  The patient was positioned supine and prepped and draped in normal sterile fashion.  An appropriate time out was performed.  After the administration of kefzol pre-op antibiotic the leg was elevated and exsanguinated and a tourniquet inflated. A standard longitudinal incision was made on the anterior knee.   Dissection was carried down to the extensor mechanism.  All appropriate anti-infective measures were used including the pre-operative antibiotic, betadine impregnated drape, and closed hooded exhaust systems for each member of the surgical team.  A medial parapatellar incision was made in the extensor mechanism and the knee cap flipped and the knee flexed.  Some residual meniscal tissues were removed along with any remaining ACL/PCL tissue.  A guide was placed on the tibia and a flat cut was made on it's superior surface.  An intramedullary guide was placed in the femur and was utilized to make anterior and posterior cuts creating an appropriate flexion gap.  A second intramedullary guide was placed in the femur to make a distal cut properly balancing the knee with an extension gap equal to the flexion gap.  The three bones sized to the above mentioned sizes and the appropriate guides were placed and utilized.  A trial reduction was done and the knee easily came to full extension and the patella tracked well on flexion.  The trial components were removed and all bones were cleaned with pulsatile lavage and then dried thoroughly.  Cement was mixed and was pressurized onto the bones followed by placement of the aforementioned components.  Excess cement was trimmed and pressure was held on the components until the cement had hardened.  The tourniquet was deflated and a small amount of bleeding was controlled with cautery and pressure.  The knee was irrigated thoroughly.  The extensor mechanism was re-approximated with V-loc suture in running fashion.  The knee was flexed and the repair was solid.  The subcutaneous tissues were re-approximated with #0 and #2-0 vicryl and the skin  closed with a subcuticular stitch and steristrips.  A sterile dressing was applied.  Intraoperative fluids, EBL, and tourniquet time can be obtained from anesthesia records.  DISPOSITION:  The patient was taken to recovery room in stable  condition and admitted for appropriate post-op care to include peri-operative antibiotic and DVT prophylaxis with mechanical and pharmacologic measures.  Calixto Pavel G 02/17/2017, 11:26 AM

## 2017-02-17 NOTE — Transfer of Care (Signed)
Immediate Anesthesia Transfer of Care Note  Patient: [name]  Procedure(s) Performed: Procedure(s): TOTAL KNEE ARTHROPLASTY (Right)  Patient Location: PACU  Anesthesia Type:Spinal and MAC combined with regional for post-op pain  Level of Consciousness: awake, alert , oriented and patient cooperative  Airway & Oxygen Therapy: Patient Spontanous Breathing  Post-op Assessment: Report given to RN and Post -op Vital signs reviewed and stable  Post vital signs: Reviewed and stable  Last Vitals:  Vitals:   02/17/17 0841  BP: (!) 167/65  Pulse: 69  Resp: 20  Temp: 36.7 C  SpO2: 95%    Last Pain:  Vitals:   02/17/17 0841  TempSrc: Oral         Complications: No apparent anesthesia complications

## 2017-02-17 NOTE — Anesthesia Preprocedure Evaluation (Addendum)
Anesthesia Evaluation  Patient identified by MRN, date of birth, ID band Patient awake    Reviewed: Allergy & Precautions, NPO status , Patient's Chart, lab work & pertinent test results  Airway Mallampati: II  TM Distance: >3 FB Neck ROM: Full    Dental no notable dental hx.    Pulmonary Current Smoker,    Pulmonary exam normal breath sounds clear to auscultation       Cardiovascular hypertension, Pt. on medications Normal cardiovascular exam Rhythm:Regular Rate:Normal  ECG: NSR, rate 68   Neuro/Psych PSYCHIATRIC DISORDERS Anxiety Arnold-Chiari malformation surgically corrected    GI/Hepatic negative GI ROS, Neg liver ROS,   Endo/Other  negative endocrine ROS  Renal/GU negative Renal ROS     Musculoskeletal negative musculoskeletal ROS (+)   Abdominal   Peds  Hematology negative hematology ROS (+)   Anesthesia Other Findings   Reproductive/Obstetrics                            Anesthesia Physical Anesthesia Plan  ASA: II  Anesthesia Plan: Spinal and Regional   Post-op Pain Management:  Regional for Post-op pain   Induction: Intravenous  PONV Risk Score and Plan: 1 and Ondansetron, Dexamethasone and Propofol infusion  Airway Management Planned:   Additional Equipment:   Intra-op Plan:   Post-operative Plan:   Informed Consent: I have reviewed the patients History and Physical, chart, labs and discussed the procedure including the risks, benefits and alternatives for the proposed anesthesia with the patient or authorized representative who has indicated his/her understanding and acceptance.   Dental advisory given  Plan Discussed with: CRNA  Anesthesia Plan Comments:         Anesthesia Quick Evaluation

## 2017-02-17 NOTE — Interval H&P Note (Signed)
History and Physical Interval Note:  02/17/2017 9:04 AM  [name]  has presented today for surgery, with the diagnosis of RIGHT KNEE DEGENERATIVE JOINT DISEASE  The various methods of treatment have been discussed with the patient and family. After consideration of risks, benefits and other options for treatment, the patient has consented to  Procedure(s): TOTAL KNEE ARTHROPLASTY (Right) as a surgical intervention .  The patient's history has been reviewed, patient examined, no change in status, stable for surgery.  I have reviewed the patient's chart and labs.  Questions were answered to the patient's satisfaction.     Manar Smalling G

## 2017-02-17 NOTE — Interval H&P Note (Signed)
History and Physical Interval Note:  02/17/2017 9:04 AM  [name]  has presented today for surgery, with the diagnosis of RIGHT KNEE DEGENERATIVE JOINT DISEASE  The various methods of treatment have been discussed with the patient and family. After consideration of risks, benefits and other options for treatment, the patient has consented to  Procedure(s): TOTAL KNEE ARTHROPLASTY (Right) as a surgical intervention .  The patient's history has been reviewed, patient examined, no change in status, stable for surgery.  I have reviewed the patient's chart and labs.  Questions were answered to the patient's satisfaction.     Perl Kerney G

## 2017-02-17 NOTE — Anesthesia Procedure Notes (Signed)
Spinal  Patient location during procedure: OR Start time: 02/17/2017 9:55 AM End time: 02/17/2017 10:05 AM Staffing Anesthesiologist: Adele Barthel P Performed: anesthesiologist  Preanesthetic Checklist Completed: patient identified, surgical consent, pre-op evaluation, timeout performed, IV checked, risks and benefits discussed and monitors and equipment checked Spinal Block Patient position: sitting Prep: DuraPrep Patient monitoring: cardiac monitor, continuous pulse ox and blood pressure Approach: midline Location: L4-5 Injection technique: single-shot Needle Needle type: Quincke  Needle gauge: 22 G Needle length: 9 cm Assessment Sensory level: T10 Additional Notes Functioning IV was confirmed and monitors were applied. Sterile prep and drape, including hand hygiene and sterile gloves were used. The patient was positioned and the spine was prepped. The skin was anesthetized with lidocaine.  Free flow of clear CSF was obtained prior to injecting local anesthetic into the CSF.  The spinal needle aspirated freely following injection.  The needle was carefully withdrawn.  The patient tolerated the procedure well.

## 2017-02-17 NOTE — Anesthesia Postprocedure Evaluation (Signed)
Anesthesia Post Note  Patient: [name]  Procedure(s) Performed: Procedure(s) (LRB): TOTAL KNEE ARTHROPLASTY (Right)     Patient location during evaluation: PACU Anesthesia Type: Regional and Spinal Level of consciousness: oriented and awake and alert Pain management: pain level controlled Vital Signs Assessment: post-procedure vital signs reviewed and stable Respiratory status: spontaneous breathing, respiratory function stable and patient connected to nasal cannula oxygen Cardiovascular status: blood pressure returned to baseline and stable Postop Assessment: no headache and no backache Anesthetic complications: no    Last Vitals:  Vitals:   02/17/17 1308 02/17/17 1325  BP:  (!) 142/69  Pulse: 65 63  Resp: 19 17  Temp: 36.5 C 36.6 C  SpO2: 94% 94%    Last Pain:  Vitals:   02/17/17 1418  TempSrc:   PainSc: 6                  Ryan P Ellender

## 2017-02-17 NOTE — Evaluation (Signed)
Physical Therapy Evaluation Patient Details Name: [name] MRN: [id number] DOB: [birth year] Today's Date: 02/17/2017   History of Present Illness  Pt is a 66 y/o female s/p elective R TKA. PMH includes HTN, anxiety, arnold-chiari malformation, and tobacco abuse.   Clinical Impression  Pt is s/p surgery above with deficits below. PTA, pt reports she used RW for longer distances, however, was independent otherwise. Upon eval, pt limited by post op pain and weakness, as well as, decreased balance. Pt required min guard assist for mobility this session. Reports she will be getting HHPT services at d/c and husband will be able to assist as needed. Reports she has all necessary DME at home. Will continue to follow acutely to maximize functional mobility independence and safety.     Follow Up Recommendations DC plan and follow up therapy as arranged by surgeon;Supervision for mobility/OOB    Equipment Recommendations  None recommended by PT    Recommendations for Other Services       Precautions / Restrictions Precautions Precautions: Knee Precaution Booklet Issued: Yes (comment) Precaution Comments: Reviewed supine ther ex with pt.  Required Braces or Orthoses: Knee Immobilizer - Right Knee Immobilizer - Right: Other (comment) (until discontinued ) Restrictions Weight Bearing Restrictions: Yes RLE Weight Bearing: Weight bearing as tolerated      Mobility  Bed Mobility Overal bed mobility: Needs Assistance Bed Mobility: Supine to Sit     Supine to sit: Supervision     General bed mobility comments: Supervision for safety   Transfers Overall transfer level: Needs assistance Equipment used: Rolling walker (2 wheeled) Transfers: Sit to/from Stand Sit to Stand: Min guard         General transfer comment: Min guard for safety. Verbal cues for safe hand placement   Ambulation/Gait Ambulation/Gait assistance: Min guard Ambulation Distance (Feet): 15 Feet Assistive  device: Rolling walker (2 wheeled) Gait Pattern/deviations: Step-to pattern;Decreased step length - right;Decreased step length - left;Decreased weight shift to right;Antalgic Gait velocity: Decreased Gait velocity interpretation: Below normal speed for age/gender General Gait Details: slow, antalgic gait. Verbal cues for sequencing with RW. Pt reporting wooziness during gait, so distance limited   Stairs            Wheelchair Mobility    Modified Rankin (Stroke Patients Only)       Balance Overall balance assessment: Needs assistance Sitting-balance support: No upper extremity supported;Feet supported Sitting balance-Leahy Scale: Good     Standing balance support: Bilateral upper extremity supported;During functional activity Standing balance-Leahy Scale: Poor Standing balance comment: Reliant on RW for stability                              Pertinent Vitals/Pain Pain Assessment: 0-10 Pain Score: 6  Pain Location: R knee  Pain Descriptors / Indicators: Aching;Operative site guarding Pain Intervention(s): Limited activity within patient's tolerance;Monitored during session;Repositioned    Home Living Family/patient expects to be discharged to:: Private residence Living Arrangements: Spouse/significant other Available Help at Discharge: Family;Available 24 hours/day Type of Home: House Home Access: Stairs to enter Entrance Stairs-Rails: None Entrance Stairs-Number of Steps: 1 (onto porch) Home Layout: One level Home Equipment: [place] - 2 wheels;Bedside commode      Prior Function Level of Independence: Independent with assistive device(s)         Comments: Occaisionally used RW, especially for longer distances.      Hand Dominance        Extremity/Trunk Assessment  Upper Extremity Assessment Upper Extremity Assessment: Defer to OT evaluation    Lower Extremity Assessment Lower Extremity Assessment: RLE deficits/detail RLE Deficits /  Details: Sensory in tact. Deficits consistent with post op pain and weakness. Able to perform ther ex below.     Cervical / Trunk Assessment Cervical / Trunk Assessment: Normal  Communication   Communication: No difficulties  Cognition Arousal/Alertness: Awake/alert Behavior During Therapy: WFL for tasks assessed/performed Overall Cognitive Status: Within Functional Limits for tasks assessed                                        General Comments      Exercises Total Joint Exercises Ankle Circles/Pumps: AROM;Both;20 reps;Supine Quad Sets: AROM;Right;10 reps;Supine Towel Squeeze: AROM;Both;10 reps;Supine Heel Slides: AROM;Right;10 reps;Supine Hip ABduction/ADduction: AROM;Right;10 reps;Supine   Assessment/Plan    PT Assessment Patient needs continued PT services  PT Problem List Decreased strength;Decreased range of motion;Decreased balance;Decreased activity tolerance;Decreased mobility;Decreased knowledge of use of DME;Decreased knowledge of precautions;Pain       PT Treatment Interventions DME instruction;Gait training;Stair training;Functional mobility training;Therapeutic activities;Therapeutic exercise;Balance training;Neuromuscular re-education;Patient/family education    PT Goals (Current goals can be found in the Care Plan section)  Acute Rehab PT Goals Patient Stated Goal: to go home  PT Goal Formulation: With patient Time For Goal Achievement: 02/24/17 Potential to Achieve Goals: Good    Frequency 7X/week   Barriers to discharge        Co-evaluation               AM-PAC PT "6 Clicks" Daily Activity  Outcome Measure Difficulty turning over in bed (including adjusting bedclothes, sheets and blankets)?: None Difficulty moving from lying on back to sitting on the side of the bed? : None Difficulty sitting down on and standing up from a chair with arms (e.g., wheelchair, bedside commode, etc,.)?: Unable Help needed moving to and from a  bed to chair (including a wheelchair)?: A Little Help needed walking in hospital room?: A Little Help needed climbing 3-5 steps with a railing? : A Lot 6 Click Score: 17    End of Session Equipment Utilized During Treatment: Gait belt;Right knee immobilizer Activity Tolerance: Patient tolerated treatment well Patient left: in chair;with call bell/phone within reach Nurse Communication: Mobility status (IV beeping ) PT Visit Diagnosis: Other abnormalities of gait and mobility (R26.89);Pain Pain - Right/Left: Right Pain - part of body: Knee    Time: 1725-1755 PT Time Calculation (min) (ACUTE ONLY): 30 min   Charges:   PT Evaluation $PT Eval Low Complexity: 1 Low PT Treatments $Gait Training: 8-22 mins   PT G Codes:        Leighton Ruff, PT, DPT  Acute Rehabilitation Services  Pager: [phone]   Rudean Hitt 02/17/2017, 6:03 PM

## 2017-02-17 NOTE — Anesthesia Procedure Notes (Signed)
Anesthesia Regional Block: Adductor canal block   Pre-Anesthetic Checklist: ,, timeout performed, Correct Patient, Correct Site, Correct Laterality, Correct Procedure,, site marked, risks and benefits discussed, Surgical consent,  Pre-op evaluation,  At surgeon's request and post-op pain management  Laterality: Right  Prep: chloraprep       Needles:  Injection technique: Single-shot  Needle Type: Echogenic Stimulator Needle     Needle Length: 9cm  Needle Gauge: 21     Additional Needles:   Procedures: ultrasound guided,,,,,,,,  Narrative:  Start time: 02/17/2017 9:20 AM End time: 02/17/2017 9:30 AM Injection made incrementally with aspirations every 5 mL.  Performed by: Personally  Anesthesiologist: Adele Barthel P  Additional Notes: Functioning IV was confirmed and monitors were applied.  A 52mm 21ga Arrow echogenic stimulator needle was used. Sterile prep, hand hygiene and sterile gloves were used.  Negative aspiration and negative test dose prior to incremental administration of local anesthetic. The patient tolerated the procedure well.

## 2017-02-18 ENCOUNTER — Encounter (HOSPITAL_COMMUNITY): Payer: Self-pay | Admitting: Orthopaedic Surgery

## 2017-02-18 MED ORDER — PROMETHAZINE HCL 25 MG PO TABS: 12.5000 mg | ORAL_TABLET | Freq: Four times a day (QID) | ORAL | Status: DC | PRN

## 2017-02-18 MED ORDER — NICOTINE 7 MG/24HR TD PT24
7.0000 mg | MEDICATED_PATCH | Freq: Every day | TRANSDERMAL | Status: DC
  Filled 2017-02-18 (×2): qty 1

## 2017-02-18 MED ORDER — TRAMADOL HCL 50 MG PO TABS
50.0000 mg | ORAL_TABLET | Freq: Four times a day (QID) | ORAL | Status: DC
  Administered 2017-02-18 (×2): 100 mg via ORAL
  Administered 2017-02-18: 50 mg via ORAL
  Filled 2017-02-18: qty 2
  Filled 2017-02-18: qty 1
  Filled 2017-02-18: qty 2

## 2017-02-18 MED ORDER — ALPRAZOLAM 0.25 MG PO TABS
0.2500 mg | ORAL_TABLET | Freq: Three times a day (TID) | ORAL | Status: DC | PRN
  Administered 2017-02-18 (×3): 0.25 mg via ORAL
  Filled 2017-02-18 (×3): qty 1

## 2017-02-18 NOTE — Therapy (Signed)
Occupational Therapy Evaluation Patient Details Name: [name] MRN: [id number] DOB: 1951-05-06 Today's Date: 02/18/2017    History of Present Illness Pt is a 66 y/o female s/p elective R TKA. PMH includes HTN, anxiety, arnold-chiari malformation, and tobacco abuse.    Clinical Impression   Pt reports being independent in ADLs with the exception of using a RW for longer distances. Currently pt requires min assist for LB ADLs and tub/shower transfer. Pt requires min guard for all other ADLs and functional mobility at RW level. Pt reports family is able to provide 24 hour assistance as needed. OT will follow acutely to address established goals.     Follow Up Recommendations  No OT follow up;Supervision/Assistance - 24 hour    Equipment Recommendations  None recommended by OT    Recommendations for Other Services       Precautions / Restrictions Precautions Precautions: Knee Precaution Booklet Issued: Yes (comment) Precaution Comments: Reviewed supine ther ex with pt.  Required Braces or Orthoses: Knee Immobilizer - Right Knee Immobilizer - Right: Other (comment) Restrictions Weight Bearing Restrictions: Yes RLE Weight Bearing: Weight bearing as tolerated      Mobility Bed Mobility Overal bed mobility: Needs Assistance Bed Mobility: Supine to Sit     Supine to sit: Supervision     General bed mobility comments: Pt standing with PT upon OT arrival.  Transfers Overall transfer level: Needs assistance Equipment used: Rolling walker (2 wheeled) Transfers: Sit to/from Stand Sit to Stand: Min guard         General transfer comment: Poor hand placement remains, pt impulsive with transfers and pulls into standing on RW.  NO LOB.      Balance Overall balance assessment: Needs assistance Sitting-balance support: No upper extremity supported;Feet supported Sitting balance-Leahy Scale: Good     Standing balance support: Bilateral upper extremity supported;During  functional activity Standing balance-Leahy Scale: Fair Standing balance comment: Reliant on RW for stability                            ADL either performed or assessed with clinical judgement   ADL Overall ADL's : Needs assistance/impaired     Grooming: Min guard;Cueing for safety;Standing   Upper Body Bathing: Min guard;Set up;Cueing for safety;Standing   Lower Body Bathing: Minimal assistance;Cueing for safety;Sit to/from stand   Upper Body Dressing : Min guard;Set up;Cueing for safety;Sitting   Lower Body Dressing: Minimal assistance;Cueing for safety;Cueing for compensatory techniques;Sit to/from stand   Toilet Transfer: Min guard;Cueing for safety;Ambulation;RW;Grab bars;Regular Museum/gallery exhibitions officer and Hygiene: Min guard;Cueing for safety;Sit to/from stand   Tub/ Shower Transfer: Minimal assistance;Cueing for safety;Cueing for sequencing;Ambulation;Grab bars;Rolling walker   Functional mobility during ADLs: Min guard;Cueing for safety;Cueing for sequencing;Rolling walker General ADL Comments: Pt educated on compensatory strategies to complete ADLs. Pt not interested in shower seat but reports her husband will be there as needed to help.      Vision         Perception     Praxis      Pertinent Vitals/Pain Pain Assessment: Faces Pain Score: 6  Faces Pain Scale: Hurts even more Pain Location: R knee  Pain Descriptors / Indicators: Aching;Operative site guarding Pain Intervention(s): Limited activity within patient's tolerance;Monitored during session;Ice applied     Hand Dominance Right   Extremity/Trunk Assessment Upper Extremity Assessment Upper Extremity Assessment: Overall WFL for tasks assessed   Lower Extremity Assessment Lower Extremity Assessment:  Defer to PT evaluation   Cervical / Trunk Assessment Cervical / Trunk Assessment: Normal   Communication Communication Communication: No difficulties   Cognition  Arousal/Alertness: Awake/alert Behavior During Therapy: WFL for tasks assessed/performed Overall Cognitive Status: Within Functional Limits for tasks assessed                                     General Comments       Exercises    Shoulder Instructions      Home Living Family/patient expects to be discharged to:: Private residence Living Arrangements: Spouse/significant other Available Help at Discharge: Family;Available 24 hours/day Type of Home: House Home Access: Stairs to enter [employer] of Steps: 1 Entrance Stairs-Rails: None Home Layout: One level     Bathroom Shower/Tub: Teacher, early years/pre: Standard Bathroom Accessibility: Yes How Accessible: Accessible via walker Home Equipment: [place] - 2 wheels;Bedside commode;Grab bars - toilet          Prior Functioning/Environment Level of Independence: Independent with assistive device(s)        Comments: Occaisionally used RW, especially for longer distances.         OT Problem List: Decreased strength;Decreased range of motion;Decreased activity tolerance;Decreased safety awareness;Decreased knowledge of use of DME or AE;Pain      OT Treatment/Interventions: Self-care/ADL training;DME and/or AE instruction;Therapeutic activities;Patient/family education;Balance training    OT Goals(Current goals can be found in the care plan section) Acute Rehab OT Goals Patient Stated Goal: to go home  OT Goal Formulation: With patient Time For Goal Achievement: 02/18/17 Potential to Achieve Goals: Good ADL Goals Pt Will Perform Lower Body Bathing: with modified independence;sit to/from stand (AE as needed) Pt Will Perform Lower Body Dressing: with modified independence;sit to/from stand (AE as needed) Pt Will Perform Tub/Shower Transfer: with modified independence;ambulating;rolling walker;grab bars  OT Frequency: Min 2X/week   Barriers to D/C:            Co-evaluation               AM-PAC PT "6 Clicks" Daily Activity     Outcome Measure Help from another person eating meals?: None Help from another person taking care of personal grooming?: None Help from another person toileting, which includes using toliet, bedpan, or urinal?: A Little Help from another person bathing (including washing, rinsing, drying)?: A Little Help from another person to put on and taking off regular upper body clothing?: None Help from another person to put on and taking off regular lower body clothing?: A Little 6 Click Score: 21   End of Session Equipment Utilized During Treatment: Gait belt;Rolling walker CPM Right Knee CPM Right Knee: Off Nurse Communication: Mobility status  Activity Tolerance: Patient limited by pain;Patient limited by fatigue Patient left: in bed;with call bell/phone within reach;with family/visitor present  OT Visit Diagnosis: Unsteadiness on feet (R26.81);Muscle weakness (generalized) (M62.81);Pain Pain - Right/Left: Right Pain - part of body: Knee                Time: 2751-7001 OT Time Calculation (min): 19 min Charges:  OT General Charges $OT Visit: 1 Visit OT Evaluation $OT Eval Moderate Complexity: 1 Mod G-Codes:     Boykin Peek, OTS [phone]   Boykin Peek 02/18/2017, 4:09 PM

## 2017-02-18 NOTE — Progress Notes (Signed)
Physical Therapy Treatment Patient Details Name: [name] MRN: [id number] DOB: 07-Feb-1951 Today's Date: 02/18/2017    History of Present Illness Pt is a 66 y/o female s/p elective R TKA. PMH includes HTN, anxiety, arnold-chiari malformation, and tobacco abuse.     PT Comments    Pt performed review of gait and supine exercise during session this pm.  Pt remains to require cues for gait symmetry.  OT dovetailed session for eval.  Pt left sitting in bathroom with OT.     Follow Up Recommendations  DC plan and follow up therapy as arranged by surgeon;Supervision for mobility/OOB     Equipment Recommendations  None recommended by PT    Recommendations for Other Services       Precautions / Restrictions Precautions Precautions: Knee Precaution Booklet Issued: Yes (comment) Precaution Comments: Reviewed supine ther ex with pt.  Required Braces or Orthoses: Knee Immobilizer - Right Knee Immobilizer - Right: Other (comment) (until d/c) Restrictions Weight Bearing Restrictions: Yes RLE Weight Bearing: Weight bearing as tolerated    Mobility  Bed Mobility Overal bed mobility: Needs Assistance Bed Mobility: Supine to Sit     Supine to sit: Supervision     General bed mobility comments: Supervision for safety   Transfers Overall transfer level: Needs assistance Equipment used: Rolling walker (2 wheeled) Transfers: Sit to/from Stand Sit to Stand: Supervision         General transfer comment: Poor hand placement remains, pt impulsive with transfers and pulls into standing on RW.  NO LOB.    Ambulation/Gait Ambulation/Gait assistance: Min guard Ambulation Distance (Feet): 200 Feet Assistive device: Rolling walker (2 wheeled) Gait Pattern/deviations: Step-through pattern;Trunk flexed;Decreased stride length;Decreased dorsiflexion - right Gait velocity: Decreased Gait velocity interpretation: Below normal speed for age/gender General Gait Details: Cues for R heel  strike and knee extension in stance phase.  Cues for upper trunk control.  Cues for increasing B stride into step through pattern.  Gait symmetry improved as gait progressed.     Stairs            Wheelchair Mobility    Modified Rankin (Stroke Patients Only)       Balance Overall balance assessment: Needs assistance   Sitting balance-Leahy Scale: Good       Standing balance-Leahy Scale: Poor Standing balance comment: Reliant on RW for stability                             Cognition Arousal/Alertness: Awake/alert Behavior During Therapy: WFL for tasks assessed/performed Overall Cognitive Status: Within Functional Limits for tasks assessed                                        Exercises Total Joint Exercises Ankle Circles/Pumps: AROM;Both;20 reps;Supine Quad Sets: AROM;Right;10 reps;Supine Towel Squeeze: AROM;Both;10 reps;Supine Short Arc Quad: AROM;Right;10 reps;Supine Heel Slides: AROM;Right;10 reps;Supine Hip ABduction/ADduction: AROM;Right;10 reps;Supine Straight Leg Raises: AAROM;Right;10 reps;Supine Goniometric ROM: 88 degrees R knee flexion.      General Comments        Pertinent Vitals/Pain Pain Assessment: 0-10 Pain Score: 6  Pain Location: R knee  Pain Descriptors / Indicators: Aching;Operative site guarding Pain Intervention(s): Monitored during session;Repositioned    Home Living                      Prior Function  PT Goals (current goals can now be found in the care plan section) Acute Rehab PT Goals Patient Stated Goal: to go home  Potential to Achieve Goals: Good Progress towards PT goals: Progressing toward goals    Frequency    7X/week      PT Plan Current plan remains appropriate    Co-evaluation              AM-PAC PT "6 Clicks" Daily Activity  Outcome Measure  Difficulty turning over in bed (including adjusting bedclothes, sheets and blankets)?: None Difficulty  moving from lying on back to sitting on the side of the bed? : None Difficulty sitting down on and standing up from a chair with arms (e.g., wheelchair, bedside commode, etc,.)?: A Little Help needed moving to and from a bed to chair (including a wheelchair)?: A Little Help needed walking in hospital room?: A Little Help needed climbing 3-5 steps with a railing? : A Little 6 Click Score: 20    End of Session Equipment Utilized During Treatment: Gait belt Activity Tolerance: Patient tolerated treatment well Patient left: in chair;with call bell/phone within reach Nurse Communication: Mobility status PT Visit Diagnosis: Other abnormalities of gait and mobility (R26.89);Pain Pain - Right/Left: Right Pain - part of body: Knee     Time: 8675-4492 PT Time Calculation (min) (ACUTE ONLY): 11 min  Charges:  $Gait Training: 8-22 mins                    G Codes:       Governor Rooks, PTA pager [phone]    Cristela Blue 02/18/2017, 3:05 PM

## 2017-02-18 NOTE — Progress Notes (Signed)
Physical Therapy Treatment Patient Details Name: [name] MRN: [id number] DOB: 1950-09-22 Today's Date: 02/18/2017    History of Present Illness Pt is a 66 y/o female s/p elective R TKA. PMH includes HTN, anxiety, arnold-chiari malformation, and tobacco abuse.     PT Comments    Pt performed increased gait during session with progression to step through pattern.  Pt tolerated tx well.  Will f/u in pm for continued therapy.     Follow Up Recommendations  DC plan and follow up therapy as arranged by surgeon;Supervision for mobility/OOB     Equipment Recommendations  None recommended by PT    Recommendations for Other Services       Precautions / Restrictions Precautions Precautions: Knee Precaution Booklet Issued: Yes (comment) Precaution Comments: Reviewed supine ther ex with pt.  Required Braces or Orthoses: Knee Immobilizer - Right Knee Immobilizer - Right: Other (comment) (until d/c) Restrictions Weight Bearing Restrictions: Yes RLE Weight Bearing: Weight bearing as tolerated    Mobility  Bed Mobility Overal bed mobility: Needs Assistance Bed Mobility: Supine to Sit     Supine to sit: Supervision     General bed mobility comments: Supervision for safety   Transfers Overall transfer level: Needs assistance Equipment used: Rolling walker (2 wheeled) Transfers: Sit to/from Stand Sit to Stand: Min guard         General transfer comment: Poor hand placement remains, pt impulsive with transfers and pulls into standing on RW.  NO LOB.    Ambulation/Gait Ambulation/Gait assistance: Min guard Ambulation Distance (Feet): 200 Feet Assistive device: Rolling walker (2 wheeled) Gait Pattern/deviations: Step-through pattern;Trunk flexed;Decreased stride length;Decreased dorsiflexion - right Gait velocity: Decreased Gait velocity interpretation: Below normal speed for age/gender General Gait Details: Cues for R heel strike and knee extension in stance phase.  Cues  for upper trunk control.  Cues for increasing B stride into step through pattern.  Gait symmetry improved as gait progressed.     Stairs            Wheelchair Mobility    Modified Rankin (Stroke Patients Only)       Balance Overall balance assessment: Needs assistance   Sitting balance-Leahy Scale: Good       Standing balance-Leahy Scale: Poor Standing balance comment: Reliant on RW for stability                             Cognition Arousal/Alertness: Awake/alert Behavior During Therapy: WFL for tasks assessed/performed Overall Cognitive Status: Within Functional Limits for tasks assessed                                        Exercises Total Joint Exercises Ankle Circles/Pumps: AROM;Both;20 reps;Supine Quad Sets: AROM;Right;10 reps;Supine Towel Squeeze: AROM;Both;10 reps;Supine Short Arc Quad: AROM;Right;10 reps;Supine Heel Slides: AROM;Right;10 reps;Supine Hip ABduction/ADduction: AROM;Right;10 reps;Supine Straight Leg Raises: AAROM;Right;10 reps;Supine Goniometric ROM: 88 degrees R knee flexion.      General Comments        Pertinent Vitals/Pain Pain Assessment: 0-10 Pain Score: 6  Pain Location: R knee  Pain Descriptors / Indicators: Aching;Operative site guarding Pain Intervention(s): Monitored during session;Limited activity within patient's tolerance;Ice applied    Home Living                      Prior Function  PT Goals (current goals can now be found in the care plan section) Acute Rehab PT Goals Patient Stated Goal: to go home  Potential to Achieve Goals: Good Progress towards PT goals: Progressing toward goals    Frequency    7X/week      PT Plan Current plan remains appropriate    Co-evaluation              AM-PAC PT "6 Clicks" Daily Activity  Outcome Measure  Difficulty turning over in bed (including adjusting bedclothes, sheets and blankets)?: None Difficulty moving  from lying on back to sitting on the side of the bed? : None Difficulty sitting down on and standing up from a chair with arms (e.g., wheelchair, bedside commode, etc,.)?: A Little Help needed moving to and from a bed to chair (including a wheelchair)?: A Little Help needed walking in hospital room?: A Little Help needed climbing 3-5 steps with a railing? : A Little 6 Click Score: 20    End of Session Equipment Utilized During Treatment: Gait belt Activity Tolerance: Patient tolerated treatment well Patient left: in chair;with call bell/phone within reach Nurse Communication: Mobility status PT Visit Diagnosis: Other abnormalities of gait and mobility (R26.89);Pain Pain - Right/Left: Right Pain - part of body: Knee     Time: 4469-5072 PT Time Calculation (min) (ACUTE ONLY): 22 min  Charges:  $Gait Training: 8-22 mins                    G Codes:       Governor Rooks, PTA pager [phone]    Cristela Blue 02/18/2017, 1:40 PM

## 2017-02-18 NOTE — Progress Notes (Signed)
Subjective: 1 Day Post-Op Procedure(s) (LRB): TOTAL KNEE ARTHROPLASTY (Right)  Patient has nausea but not much pain.  Activity level:  wbat Diet tolerance:  ok Voiding:  ok Patient reports pain as mild.    Objective: Vital signs in last 24 hours: Temp:  [97.5 F (36.4 C)-98.1 F (36.7 C)] 97.5 F (36.4 C) (09/05 0551) Pulse Rate:  [59-84] 84 (09/05 0551) Resp:  [15-20] 16 (09/05 0551) BP: (133-167)/(61-69) 139/62 (09/05 0551) SpO2:  [92 %-97 %] 96 % (09/05 0551) Weight:  [76.2 kg (168 lb)] 76.2 kg (168 lb) (09/04 0841)  Labs: No results for input(s): HGB in the last 72 hours. No results for input(s): WBC, RBC, HCT, PLT in the last 72 hours. No results for input(s): NA, K, CL, CO2, BUN, CREATININE, GLUCOSE, CALCIUM in the last 72 hours. No results for input(s): LABPT, INR in the last 72 hours.  Physical Exam:  Neurologically intact ABD soft Neurovascular intact Sensation intact distally Intact pulses distally Dorsiflexion/Plantar flexion intact Incision: dressing C/D/I and no drainage No cellulitis present Compartment soft  Assessment/Plan:  1 Day Post-Op Procedure(s) (LRB): TOTAL KNEE ARTHROPLASTY (Right) Advance diet Up with therapy D/C IV fluids Plan for discharge tomorrow Discharge home with home health if doing well and cleared by PT. I have added phenergan and tramadol and we will stop norco. Follow up in office 2 weeks post op. Continue on ASA 325mg  BID x 2 weeks   [name], [name] 02/18/2017, 7:57 AM

## 2017-02-18 NOTE — Progress Notes (Signed)
Orthopedic Tech Progress Note Patient Details:  [name] 02/27/1951 [id number]  Ortho Devices Ortho Device/Splint Location: on cpm at 1900   Braulio Bosch 02/18/2017, 7:00 PM

## 2017-02-18 NOTE — Care Management Note (Signed)
Case Management Note  Patient Details  Name: BLISS BEHNKE MRN: [id number] Date of Birth: September 21, 1950  Subjective/Objective:     66 yr old female s/p right total knee arthroplasty.               Action/Plan: Case manager spoke with patient concerning discharge plan and DME needs. Patient says her husband and friend will be assisting her at discharge. She has RW and 3in1, [employer] has delivered CPM. Choice for Home Health agency was offered. Referral was called to Stevie Kern, [place] Liaison.    Expected Discharge Date:   02/19/17               Expected Discharge Plan:  [place]  In-House Referral:  NA  Discharge planning Services  CM Consult  Post Acute Care Choice:  Home Health, Durable Medical Equipment Choice offered to:  Patient  DME Arranged:  CPM (has RW , 3in1 and riser) DME Agency:  [employer]  HH Arranged:  PT [place]:  [place]  Status of Service:  Completed, signed off  If discussed at [place] of Stay Meetings, dates discussed:    Additional Comments:  Ninfa Meeker, RN 02/18/2017, 11:52 AM

## 2017-02-19 MED ORDER — DOCUSATE SODIUM 100 MG PO CAPS: 100.0000 mg | ORAL_CAPSULE | Freq: Two times a day (BID) | ORAL | 0 refills | Status: DC

## 2017-02-19 MED ORDER — TRAMADOL HCL 50 MG PO TABS: 50.0000 mg | ORAL_TABLET | Freq: Four times a day (QID) | ORAL | 0 refills | Status: DC

## 2017-02-19 MED ORDER — BISACODYL 5 MG PO TBEC: 5.0000 mg | DELAYED_RELEASE_TABLET | Freq: Every day | ORAL | 0 refills | Status: DC | PRN

## 2017-02-19 MED ORDER — ASPIRIN 325 MG PO TBEC: 325.0000 mg | DELAYED_RELEASE_TABLET | Freq: Two times a day (BID) | ORAL | 0 refills | Status: DC

## 2017-02-19 MED ORDER — METHOCARBAMOL 500 MG PO TABS: 500.0000 mg | ORAL_TABLET | Freq: Four times a day (QID) | ORAL | 0 refills | Status: DC | PRN

## 2017-02-19 NOTE — Progress Notes (Signed)
Pt discharge instructions reviewed with pt and her husband. Pt and husband verbalize understanding. Pt belongings with pt. Pt is not in distress. Pt discharged via wheelchair.

## 2017-02-19 NOTE — Discharge Summary (Signed)
Patient ID: [name] MRN: [id number] DOB/AGE: 08-23-1950 66 y.o.  Admit date: 02/17/2017 Discharge date: 02/19/2017  Admission Diagnoses:  Principal Problem:   Primary localized osteoarthritis of right knee Active Problems:   Primary osteoarthritis of right knee   Discharge Diagnoses:  Same  Past Medical History:  Diagnosis Date  . Anxiety   . Arnold-Chiari malformation ([place])   . Arthritis   . H. pylori infection    3 years ago  . Incontinence   . Syncope and collapse    Per pt, denies passing out  . Tobacco use disorder   . Unspecified essential hypertension     Surgeries: Procedure(s): TOTAL KNEE ARTHROPLASTY on 02/17/2017   Consultants:   Discharged Condition: Improved  Hospital Course: [name] is an 66 y.o. female who was admitted 02/17/2017 for operative treatment ofPrimary localized osteoarthritis of right knee. Patient has severe unremitting pain that affects sleep, daily activities, and work/hobbies. After pre-op clearance the patient was taken to the operating room on 02/17/2017 and underwent  Procedure(s): TOTAL KNEE ARTHROPLASTY.    Patient was given perioperative antibiotics: Anti-infectives    Start     Dose/Rate Route Frequency Ordered Stop   02/17/17 1530  ceFAZolin (ANCEF) IVPB 2g/100 mL premix     2 g 200 mL/hr over 30 Minutes Intravenous Every 6 hours 02/17/17 1323 02/17/17 2238   02/17/17 0845  ceFAZolin (ANCEF) 2-4 GM/100ML-% IVPB    Comments:  Starleen Arms   : cabinet override      02/17/17 0845 02/17/17 1009   02/17/17 0842  ceFAZolin (ANCEF) IVPB 2g/100 mL premix     2 g 200 mL/hr over 30 Minutes Intravenous On call to O.R. 02/17/17 3545 02/17/17 1019       Patient was given sequential compression devices, early ambulation, and chemoprophylaxis to prevent DVT.  Patient benefited maximally from hospital stay and there were no complications.    Recent vital signs: Patient Vitals for the past 24 hrs:  BP Temp Temp src Pulse Resp  SpO2  02/19/17 0448 (!) 136/53 98.5 F (36.9 C) Oral 66 16 96 %  02/18/17 2239 (!) 147/53 98.4 F (36.9 C) Oral 66 16 93 %  02/18/17 1500 (!) 144/60 98.6 F (37 C) Oral 73 16 92 %     Recent laboratory studies: No results for input(s): WBC, HGB, HCT, PLT, NA, K, CL, CO2, BUN, CREATININE, GLUCOSE, INR, CALCIUM in the last 72 hours.  Invalid input(s): PT, 2   Discharge Medications:   Allergies as of 02/19/2017      Reactions   Shellfish-derived Products Anaphylaxis   Ativan [lorazepam] Other (See Comments)   Makes "skin crawl" and insomnia   Buspar [buspirone] Nausea Only   Sweating and dizzy   Clarithromycin Swelling   Codeine Nausea And Vomiting   Doxycycline Other (See Comments)   Unknown   Guaifenesin Other (See Comments)   Palpitations   Oxycodone Nausea And Vomiting   Prednisone Nausea And Vomiting, Other (See Comments)   Makes my heart race   Statins Other (See Comments)   Muscle pain   Sulfonamide Derivatives Nausea And Vomiting   Achiness   Tetracycline Nausea Only   Vicodin [hydrocodone-acetaminophen] Nausea And Vomiting   Famotidine Rash   Latex Rash   Omeprazole Nausea Only   Cough, shortness of breath - patient doesn't remember   Peanut-containing Drug Products Itching, Rash      Medication List    TAKE these medications   acetaminophen 500 MG  tablet Commonly known as:  TYLENOL Take 1,000 mg by mouth 2 (two) times daily as needed (pain).   ALPRAZolam 0.25 MG tablet Commonly known as:  XANAX TAKE ONE TABLET BY MOUTH THREE TIMES DAILY AS NEEDED What changed:  See the new instructions.   amLODipine-benazepril 5-20 MG capsule Commonly known as:  LOTREL Take 1 capsule by mouth daily.   aspirin 325 MG EC tablet Take 1 tablet (325 mg total) by mouth 2 (two) times daily after a meal.   bisacodyl 5 MG EC tablet Commonly known as:  DULCOLAX Take 1 tablet (5 mg total) by mouth daily as needed for moderate constipation.   docusate sodium 100 MG  capsule Commonly known as:  COLACE Take 1 capsule (100 mg total) by mouth 2 (two) times daily.   loratadine 10 MG tablet Commonly known as:  CLARITIN Take 10 mg by mouth daily as needed for allergies.   methocarbamol 500 MG tablet Commonly known as:  ROBAXIN Take 1 tablet (500 mg total) by mouth every 6 (six) hours as needed for muscle spasms.   traMADol 50 MG tablet Commonly known as:  ULTRAM Take 1-2 tablets (50-100 mg total) by mouth every 6 (six) hours.            Durable Medical Equipment        Start     Ordered   02/17/17 1324  DME Walker rolling  Once    Question:  Patient needs a walker to treat with the following condition  Answer:  Primary osteoarthritis of right knee   02/17/17 1323   02/17/17 1324  DME 3 n 1  Once     02/17/17 1323   02/17/17 1324  DME Bedside commode  Once    Question:  Patient needs a bedside commode to treat with the following condition  Answer:  Primary osteoarthritis of right knee   02/17/17 1323       Discharge Care Instructions        Start     Ordered   02/19/17 0000  aspirin EC 325 MG EC tablet  2 times daily after meals    Question:  Supervising Provider  Answer:  Melrose Nakayama   02/19/17 0652   02/19/17 0000  bisacodyl (DULCOLAX) 5 MG EC tablet  Daily PRN    Question:  Supervising Provider  Answer:  Melrose Nakayama   02/19/17 0652   02/19/17 0000  docusate sodium (COLACE) 100 MG capsule  2 times daily    Question:  Supervising Provider  Answer:  Melrose Nakayama   02/19/17 0652   02/19/17 0000  methocarbamol (ROBAXIN) 500 MG tablet  Every 6 hours PRN    Question:  Supervising Provider  Answer:  Melrose Nakayama   02/19/17 0652   02/19/17 0000  traMADol (ULTRAM) 50 MG tablet  Every 6 hours    Question:  Supervising Provider  Answer:  Melrose Nakayama   02/19/17 0652   02/19/17 0000  Call MD / Call 911    Comments:  If you experience chest pain or shortness of breath, CALL 911 and be transported to the hospital emergency  room.  If you develope a fever above 101 F, pus (white drainage) or increased drainage or redness at the wound, or calf pain, call your surgeon's office.   02/19/17 0652   02/19/17 0000  Diet - low sodium heart healthy     02/19/17 5701   02/19/17 0000  Constipation Prevention    Comments:  Drink  plenty of fluids.  Prune juice may be helpful.  You may use a stool softener, such as Colace (over the counter) 100 mg twice a day.  Use MiraLax (over the counter) for constipation as needed.   02/19/17 0652   02/19/17 0000  Increase activity slowly as tolerated     02/19/17 4098   02/19/17 0000  Discharge instructions    Comments:  INSTRUCTIONS AFTER JOINT REPLACEMENT   Remove items at home which could result in a fall. This includes throw rugs or furniture in walking pathways ICE to the affected joint every three hours while awake for 30 minutes at a time, for at least the first 3-5 days, and then as needed for pain and swelling.  Continue to use ice for pain and swelling. You may notice swelling that will progress down to the foot and ankle.  This is normal after surgery.  Elevate your leg when you are not up walking on it.   Continue to use the breathing machine you got in the hospital (incentive spirometer) which will help keep your temperature down.  It is common for your temperature to cycle up and down following surgery, especially at night when you are not up moving around and exerting yourself.  The breathing machine keeps your lungs expanded and your temperature down.   DIET:  As you were doing prior to hospitalization, we recommend a well-balanced diet.  DRESSING / WOUND CARE / SHOWERING  You may shower 3 days after surgery, but keep the wounds dry during showering.  You may use an occlusive plastic wrap (Press'n Seal for example), NO SOAKING/SUBMERGING IN THE BATHTUB.  If the bandage gets wet, change with a clean dry gauze.  If the incision gets wet, pat the wound dry with a clean  towel.  ACTIVITY  Increase activity slowly as tolerated, but follow the weight bearing instructions below.   No driving for 6 weeks or until further direction given by your physician.  You cannot drive while taking narcotics.  No lifting or carrying greater than 10 lbs. until further directed by your surgeon. Avoid periods of inactivity such as sitting longer than an hour when not asleep. This helps prevent blood clots.  You may return to work once you are authorized by your doctor.     WEIGHT BEARING   Weight bearing as tolerated with assist device (walker, cane, etc) as directed, use it as long as suggested by your surgeon or therapist, typically at least 4-6 weeks.   EXERCISES  Results after joint replacement surgery are often greatly improved when you follow the exercise, range of motion and muscle strengthening exercises prescribed by your doctor. Safety measures are also important to protect the joint from further injury. Any time any of these exercises cause you to have increased pain or swelling, decrease what you are doing until you are comfortable again and then slowly increase them. If you have problems or questions, call your caregiver or physical therapist for advice.   Rehabilitation is important following a joint replacement. After just a few days of immobilization, the muscles of the leg can become weakened and shrink (atrophy).  These exercises are designed to build up the tone and strength of the thigh and leg muscles and to improve motion. Often times heat used for twenty to thirty minutes before working out will loosen up your tissues and help with improving the range of motion but do not use heat for the first two weeks following surgery (sometimes heat  can increase post-operative swelling).   These exercises can be done on a training (exercise) mat, on the floor, on a table or on a bed. Use whatever works the best and is most comfortable for you.    Use music or  television while you are exercising so that the exercises are a pleasant break in your day. This will make your life better with the exercises acting as a break in your routine that you can look forward to.   Perform all exercises about fifteen times, three times per day or as directed.  You should exercise both the operative leg and the other leg as well.   Exercises include:   Quad Sets - Tighten up the muscle on the front of the thigh (Quad) and hold for 5-10 seconds.   Straight Leg Raises - With your knee straight (if you were given a brace, keep it on), lift the leg to 60 degrees, hold for 3 seconds, and slowly lower the leg.  Perform this exercise against resistance later as your leg gets stronger.  Leg Slides: Lying on your back, slowly slide your foot toward your buttocks, bending your knee up off the floor (only go as far as is comfortable). Then slowly slide your foot back down until your leg is flat on the floor again.  Angel Wings: Lying on your back spread your legs to the side as far apart as you can without causing discomfort.  Hamstring Strength:  Lying on your back, push your heel against the floor with your leg straight by tightening up the muscles of your buttocks.  Repeat, but this time bend your knee to a comfortable angle, and push your heel against the floor.  You may put a pillow under the heel to make it more comfortable if necessary.   A rehabilitation program following joint replacement surgery can speed recovery and prevent re-injury in the future due to weakened muscles. Contact your doctor or a physical therapist for more information on knee rehabilitation.    CONSTIPATION  Constipation is defined medically as fewer than three stools per week and severe constipation as less than one stool per week.  Even if you have a regular bowel pattern at home, your normal regimen is likely to be disrupted due to multiple reasons following surgery.  Combination of anesthesia,  postoperative narcotics, change in appetite and fluid intake all can affect your bowels.   YOU MUST use at least one of the following options; they are listed in order of increasing strength to get the job done.  They are all available over the counter, and you may need to use some, POSSIBLY even all of these options:    Drink plenty of fluids (prune juice may be helpful) and high fiber foods Colace 100 mg by mouth twice a day  Senokot for constipation as directed and as needed Dulcolax (bisacodyl), take with full glass of water  Miralax (polyethylene glycol) once or twice a day as needed.  If you have tried all these things and are unable to have a bowel movement in the first 3-4 days after surgery call either your surgeon or your primary doctor.    If you experience loose stools or diarrhea, hold the medications until you stool forms back up.  If your symptoms do not get better within 1 week or if they get worse, check with your doctor.  If you experience "the worst abdominal pain ever" or develop nausea or vomiting, please contact the office immediately  for further recommendations for treatment.   ITCHING:  If you experience itching with your medications, try taking only a single pain pill, or even half a pain pill at a time.  You can also use Benadryl over the counter for itching or also to help with sleep.   TED HOSE STOCKINGS:  Use stockings on both legs until for at least 2 weeks or as directed by physician office. They may be removed at night for sleeping.  MEDICATIONS:  See your medication summary on the "After Visit Summary" that nursing will review with you.  You may have some home medications which will be placed on hold until you complete the course of blood thinner medication.  It is important for you to complete the blood thinner medication as prescribed.  PRECAUTIONS:  If you experience chest pain or shortness of breath - call 911 immediately for transfer to the hospital emergency  department.   If you develop a fever greater that 101 F, purulent drainage from wound, increased redness or drainage from wound, foul odor from the wound/dressing, or calf pain - CONTACT YOUR SURGEON.                                                   FOLLOW-UP APPOINTMENTS:  If you do not already have a post-op appointment, please call the office for an appointment to be seen by your surgeon.  Guidelines for how soon to be seen are listed in your "After Visit Summary", but are typically between 1-4 weeks after surgery.  OTHER INSTRUCTIONS:   Knee Replacement:  Do not place pillow under knee, focus on keeping the knee straight while resting. CPM instructions: 0-90 degrees, 2 hours in the morning, 2 hours in the afternoon, and 2 hours in the evening. Place foam block, curve side up under heel at all times except when in CPM or when walking.  DO NOT modify, tear, cut, or change the foam block in any way.  MAKE SURE YOU:  Understand these instructions.  Get help right away if you are not doing well or get worse.    Thank you for letting us be a part of your medical care team.  It is a privilege we respect greatly.  We hope these instructions will help you stay on track for a fast and full recovery!   02/19/17 3846      Diagnostic Studies: Dg Chest 2 View  Result Date: 02/10/2017 CLINICAL DATA:  Preoperative examination prior to knee joint replacement. History of hypertension, previous episodes of syncope, current smoker. EXAM: CHEST  2 VIEW COMPARISON:  Chest x-ray of April 02, 2009 FINDINGS: The lungs are adequately inflated. The interstitial markings are coarse though stable. There is no alveolar infiltrate or pleural effusion. The heart and pulmonary vascularity are normal. The mediastinum is normal in width. There is calcification in the wall of the aortic arch. The bony thorax exhibits no acute abnormality. IMPRESSION: Mild chronic bronchitic-smoking related changes, stable. No acute  cardiopulmonary abnormality. Thoracic aortic atherosclerosis. Electronically Signed   By: David  Martinique M.D.   On: 02/10/2017 17:03    Disposition: 01-Home or Self Care  Discharge Instructions    Call MD / Call 911    Complete by:  As directed    If you experience chest pain or shortness of breath, CALL 911 and be  transported to the hospital emergency room.  If you develope a fever above 101 F, pus (white drainage) or increased drainage or redness at the wound, or calf pain, call your surgeon's office.   Constipation Prevention    Complete by:  As directed    Drink plenty of fluids.  Prune juice may be helpful.  You may use a stool softener, such as Colace (over the counter) 100 mg twice a day.  Use MiraLax (over the counter) for constipation as needed.   Diet - low sodium heart healthy    Complete by:  As directed    Discharge instructions    Complete by:  As directed    INSTRUCTIONS AFTER JOINT REPLACEMENT   Remove items at home which could result in a fall. This includes throw rugs or furniture in walking pathways ICE to the affected joint every three hours while awake for 30 minutes at a time, for at least the first 3-5 days, and then as needed for pain and swelling.  Continue to use ice for pain and swelling. You may notice swelling that will progress down to the foot and ankle.  This is normal after surgery.  Elevate your leg when you are not up walking on it.   Continue to use the breathing machine you got in the hospital (incentive spirometer) which will help keep your temperature down.  It is common for your temperature to cycle up and down following surgery, especially at night when you are not up moving around and exerting yourself.  The breathing machine keeps your lungs expanded and your temperature down.   DIET:  As you were doing prior to hospitalization, we recommend a well-balanced diet.  DRESSING / WOUND CARE / SHOWERING  You may shower 3 days after surgery, but keep the  wounds dry during showering.  You may use an occlusive plastic wrap (Press'n Seal for example), NO SOAKING/SUBMERGING IN THE BATHTUB.  If the bandage gets wet, change with a clean dry gauze.  If the incision gets wet, pat the wound dry with a clean towel.  ACTIVITY  Increase activity slowly as tolerated, but follow the weight bearing instructions below.   No driving for 6 weeks or until further direction given by your physician.  You cannot drive while taking narcotics.  No lifting or carrying greater than 10 lbs. until further directed by your surgeon. Avoid periods of inactivity such as sitting longer than an hour when not asleep. This helps prevent blood clots.  You may return to work once you are authorized by your doctor.     WEIGHT BEARING   Weight bearing as tolerated with assist device (walker, cane, etc) as directed, use it as long as suggested by your surgeon or therapist, typically at least 4-6 weeks.   EXERCISES  Results after joint replacement surgery are often greatly improved when you follow the exercise, range of motion and muscle strengthening exercises prescribed by your doctor. Safety measures are also important to protect the joint from further injury. Any time any of these exercises cause you to have increased pain or swelling, decrease what you are doing until you are comfortable again and then slowly increase them. If you have problems or questions, call your caregiver or physical therapist for advice.   Rehabilitation is important following a joint replacement. After just a few days of immobilization, the muscles of the leg can become weakened and shrink (atrophy).  These exercises are designed to build up the tone and strength  of the thigh and leg muscles and to improve motion. Often times heat used for twenty to thirty minutes before working out will loosen up your tissues and help with improving the range of motion but do not use heat for the first two weeks following  surgery (sometimes heat can increase post-operative swelling).   These exercises can be done on a training (exercise) mat, on the floor, on a table or on a bed. Use whatever works the best and is most comfortable for you.    Use music or television while you are exercising so that the exercises are a pleasant break in your day. This will make your life better with the exercises acting as a break in your routine that you can look forward to.   Perform all exercises about fifteen times, three times per day or as directed.  You should exercise both the operative leg and the other leg as well.   Exercises include:   Quad Sets - Tighten up the muscle on the front of the thigh (Quad) and hold for 5-10 seconds.   Straight Leg Raises - With your knee straight (if you were given a brace, keep it on), lift the leg to 60 degrees, hold for 3 seconds, and slowly lower the leg.  Perform this exercise against resistance later as your leg gets stronger.  Leg Slides: Lying on your back, slowly slide your foot toward your buttocks, bending your knee up off the floor (only go as far as is comfortable). Then slowly slide your foot back down until your leg is flat on the floor again.  Angel Wings: Lying on your back spread your legs to the side as far apart as you can without causing discomfort.  Hamstring Strength:  Lying on your back, push your heel against the floor with your leg straight by tightening up the muscles of your buttocks.  Repeat, but this time bend your knee to a comfortable angle, and push your heel against the floor.  You may put a pillow under the heel to make it more comfortable if necessary.   A rehabilitation program following joint replacement surgery can speed recovery and prevent re-injury in the future due to weakened muscles. Contact your doctor or a physical therapist for more information on knee rehabilitation.    CONSTIPATION  Constipation is defined medically as fewer than three stools  per week and severe constipation as less than one stool per week.  Even if you have a regular bowel pattern at home, your normal regimen is likely to be disrupted due to multiple reasons following surgery.  Combination of anesthesia, postoperative narcotics, change in appetite and fluid intake all can affect your bowels.   YOU MUST use at least one of the following options; they are listed in order of increasing strength to get the job done.  They are all available over the counter, and you may need to use some, POSSIBLY even all of these options:    Drink plenty of fluids (prune juice may be helpful) and high fiber foods Colace 100 mg by mouth twice a day  Senokot for constipation as directed and as needed Dulcolax (bisacodyl), take with full glass of water  Miralax (polyethylene glycol) once or twice a day as needed.  If you have tried all these things and are unable to have a bowel movement in the first 3-4 days after surgery call either your surgeon or your primary doctor.    If you experience loose stools or  diarrhea, hold the medications until you stool forms back up.  If your symptoms do not get better within 1 week or if they get worse, check with your doctor.  If you experience "the worst abdominal pain ever" or develop nausea or vomiting, please contact the office immediately for further recommendations for treatment.   ITCHING:  If you experience itching with your medications, try taking only a single pain pill, or even half a pain pill at a time.  You can also use Benadryl over the counter for itching or also to help with sleep.   TED HOSE STOCKINGS:  Use stockings on both legs until for at least 2 weeks or as directed by physician office. They may be removed at night for sleeping.  MEDICATIONS:  See your medication summary on the "After Visit Summary" that nursing will review with you.  You may have some home medications which will be placed on hold until you complete the course of  blood thinner medication.  It is important for you to complete the blood thinner medication as prescribed.  PRECAUTIONS:  If you experience chest pain or shortness of breath - call 911 immediately for transfer to the hospital emergency department.   If you develop a fever greater that 101 F, purulent drainage from wound, increased redness or drainage from wound, foul odor from the wound/dressing, or calf pain - CONTACT YOUR SURGEON.                                                   FOLLOW-UP APPOINTMENTS:  If you do not already have a post-op appointment, please call the office for an appointment to be seen by your surgeon.  Guidelines for how soon to be seen are listed in your "After Visit Summary", but are typically between 1-4 weeks after surgery.  OTHER INSTRUCTIONS:   Knee Replacement:  Do not place pillow under knee, focus on keeping the knee straight while resting. CPM instructions: 0-90 degrees, 2 hours in the morning, 2 hours in the afternoon, and 2 hours in the evening. Place foam block, curve side up under heel at all times except when in CPM or when walking.  DO NOT modify, tear, cut, or change the foam block in any way.  MAKE SURE YOU:  Understand these instructions.  Get help right away if you are not doing well or get worse.    Thank you for letting us be a part of your medical care team.  It is a privilege we respect greatly.  We hope these instructions will help you stay on track for a fast and full recovery!   Increase activity slowly as tolerated    Complete by:  As directed       Follow-up Information    Melrose Nakayama, MD. Schedule an appointment as soon as possible for a visit in 2 week(s).   Specialty:  Orthopedic Surgery Contact information: [place] [address] [address], [facility].   Why:  A representative from [facility] will contact you to arrange start date and time for your  therapy. Contact information: [address] [place] [address] [phone]            Signed: Rich Fuchs 02/19/2017, 6:53 AM

## 2017-02-19 NOTE — Progress Notes (Addendum)
Physical Therapy Treatment Patient Details Name: [name] MRN: [id number] DOB: [birth year] Today's Date: 02/19/2017    History of Present Illness Pt is a 65 y/o female s/p elective R TKA. PMH includes HTN, anxiety, arnold-chiari malformation, and tobacco abuse.     PT Comments    Pt with plans to d/c home today.  Reviewed gait and stair training in prep for d/c home including entry to front door and split level environment to reach bed room.  Pt tolerated tx well.  Therapeutic exercise deferred as patient anxious to d/c.   Pt denies questions in regards to frequency and technique.    Follow Up Recommendations  DC plan and follow up therapy as arranged by surgeon;Supervision for mobility/OOB     Equipment Recommendations       Recommendations for Other Services       Precautions / Restrictions Precautions Precautions: Knee Precaution Booklet Issued: Yes (comment) Precaution Comments: Reviewed supine ther ex with pt.  Required Braces or Orthoses: Knee Immobilizer - Right Knee Immobilizer - Right: Other (comment) Restrictions Weight Bearing Restrictions: Yes RLE Weight Bearing: Weight bearing as tolerated    Mobility  Bed Mobility               General bed mobility comments: Pt sitting on edge of bed on arrival and requested to sit edge of bed at conclusion of tx.    Transfers Overall transfer level: Needs assistance Equipment used: Rolling walker (2 wheeled) Transfers: Sit to/from Stand Sit to Stand: Supervision         General transfer comment: Pt with better ability to follow commands during transfers.    Ambulation/Gait Ambulation/Gait assistance: Supervision Ambulation Distance (Feet): 200 Feet Assistive device: Rolling walker (2 wheeled) Gait Pattern/deviations: Step-through pattern;Trunk flexed;Decreased stride length;Decreased dorsiflexion - right Gait velocity: Decreased   General Gait Details: Pt remains to require cues for weight shifting and  stride length to improve gait symmetry.  Cues for L foot clearance and upper trunk control.     Stairs Stairs: Yes   Stair Management: No rails;One rail Left;Forwards Number of Stairs: 8 (6 stairs with L rail and curb training x2 stairs) General stair comments: Pt performed 6 stairs with L rails.  Cues for sequencing and hand placement on rail.  Pt with one instance of improper sequencing and required cues to correct.  Pt performed in addition x2 trials of curb training to simulate entry into home.  Pt required cues for RW position and placement and sequencing to enter her home.    Wheelchair Mobility    Modified Rankin (Stroke Patients Only)       Balance Overall balance assessment: Needs assistance   Sitting balance-Leahy Scale: Good       Standing balance-Leahy Scale: Fair Standing balance comment: Reliant on RW for stability                             Cognition Arousal/Alertness: Awake/alert Behavior During Therapy: WFL for tasks assessed/performed Overall Cognitive Status: Within Functional Limits for tasks assessed                                        Exercises      General Comments        Pertinent Vitals/Pain Pain Assessment: 0-10 Pain Score: 6  Pain Location: R knee  Pain Descriptors /  Indicators: Aching;Operative site guarding Pain Intervention(s): Monitored during session;Repositioned;Ice applied    Home Living                      Prior Function            PT Goals (current goals can now be found in the care plan section) Acute Rehab PT Goals Patient Stated Goal: to go home  Potential to Achieve Goals: Good Progress towards PT goals: Progressing toward goals    Frequency    7X/week      PT Plan Current plan remains appropriate    Co-evaluation              AM-PAC PT "6 Clicks" Daily Activity  Outcome Measure  Difficulty turning over in bed (including adjusting bedclothes, sheets and  blankets)?: None Difficulty moving from lying on back to sitting on the side of the bed? : None Difficulty sitting down on and standing up from a chair with arms (e.g., wheelchair, bedside commode, etc,.)?: A Little Help needed moving to and from a bed to chair (including a wheelchair)?: A Little Help needed walking in hospital room?: A Little Help needed climbing 3-5 steps with a railing? : A Little 6 Click Score: 20    End of Session Equipment Utilized During Treatment: Gait belt Activity Tolerance: Patient tolerated treatment well Patient left: in chair;with call bell/phone within reach Nurse Communication: Mobility status PT Visit Diagnosis: Other abnormalities of gait and mobility (R26.89);Pain Pain - Right/Left: Right Pain - part of body: Knee     Time: 2706-2376 PT Time Calculation (min) (ACUTE ONLY): 10 min  Charges:  $Gait Training: 8-22 mins                    G Codes:       [name], PTA pager [phone]    [name] 02/19/2017, 3:33 PM

## 2017-02-20 DIAGNOSIS — R69 Illness, unspecified: Secondary | ICD-10-CM | POA: Diagnosis not present

## 2017-02-20 DIAGNOSIS — Z96651 Presence of right artificial knee joint: Secondary | ICD-10-CM | POA: Diagnosis not present

## 2017-02-20 DIAGNOSIS — Z720 Tobacco use: Secondary | ICD-10-CM | POA: Diagnosis not present

## 2017-02-20 DIAGNOSIS — Z471 Aftercare following joint replacement surgery: Secondary | ICD-10-CM | POA: Diagnosis not present

## 2017-02-20 DIAGNOSIS — I10 Essential (primary) hypertension: Secondary | ICD-10-CM | POA: Diagnosis not present

## 2017-02-23 DIAGNOSIS — R69 Illness, unspecified: Secondary | ICD-10-CM | POA: Diagnosis not present

## 2017-02-23 DIAGNOSIS — Z720 Tobacco use: Secondary | ICD-10-CM | POA: Diagnosis not present

## 2017-02-23 DIAGNOSIS — Z471 Aftercare following joint replacement surgery: Secondary | ICD-10-CM | POA: Diagnosis not present

## 2017-02-23 DIAGNOSIS — Z96651 Presence of right artificial knee joint: Secondary | ICD-10-CM | POA: Diagnosis not present

## 2017-02-23 DIAGNOSIS — I10 Essential (primary) hypertension: Secondary | ICD-10-CM | POA: Diagnosis not present

## 2017-02-25 DIAGNOSIS — I10 Essential (primary) hypertension: Secondary | ICD-10-CM | POA: Diagnosis not present

## 2017-02-25 DIAGNOSIS — Z471 Aftercare following joint replacement surgery: Secondary | ICD-10-CM | POA: Diagnosis not present

## 2017-02-25 DIAGNOSIS — R69 Illness, unspecified: Secondary | ICD-10-CM | POA: Diagnosis not present

## 2017-02-25 DIAGNOSIS — Z96651 Presence of right artificial knee joint: Secondary | ICD-10-CM | POA: Diagnosis not present

## 2017-02-25 DIAGNOSIS — Z720 Tobacco use: Secondary | ICD-10-CM | POA: Diagnosis not present

## 2017-02-26 DIAGNOSIS — I10 Essential (primary) hypertension: Secondary | ICD-10-CM | POA: Diagnosis not present

## 2017-02-26 DIAGNOSIS — Z96651 Presence of right artificial knee joint: Secondary | ICD-10-CM | POA: Diagnosis not present

## 2017-02-26 DIAGNOSIS — R69 Illness, unspecified: Secondary | ICD-10-CM | POA: Diagnosis not present

## 2017-02-26 DIAGNOSIS — Z471 Aftercare following joint replacement surgery: Secondary | ICD-10-CM | POA: Diagnosis not present

## 2017-02-26 DIAGNOSIS — Z720 Tobacco use: Secondary | ICD-10-CM | POA: Diagnosis not present

## 2017-02-27 DIAGNOSIS — M1711 Unilateral primary osteoarthritis, right knee: Secondary | ICD-10-CM | POA: Diagnosis not present

## 2017-02-27 DIAGNOSIS — Z471 Aftercare following joint replacement surgery: Secondary | ICD-10-CM | POA: Diagnosis not present

## 2017-02-27 DIAGNOSIS — Z96651 Presence of right artificial knee joint: Secondary | ICD-10-CM | POA: Diagnosis not present

## 2017-03-06 DIAGNOSIS — M25561 Pain in right knee: Secondary | ICD-10-CM | POA: Diagnosis not present

## 2017-03-06 DIAGNOSIS — M25661 Stiffness of right knee, not elsewhere classified: Secondary | ICD-10-CM | POA: Diagnosis not present

## 2017-03-06 DIAGNOSIS — Z96651 Presence of right artificial knee joint: Secondary | ICD-10-CM | POA: Diagnosis not present

## 2017-03-10 DIAGNOSIS — M25661 Stiffness of right knee, not elsewhere classified: Secondary | ICD-10-CM | POA: Diagnosis not present

## 2017-03-10 DIAGNOSIS — M25561 Pain in right knee: Secondary | ICD-10-CM | POA: Diagnosis not present

## 2017-03-10 DIAGNOSIS — Z96651 Presence of right artificial knee joint: Secondary | ICD-10-CM | POA: Diagnosis not present

## 2017-03-11 DIAGNOSIS — M25661 Stiffness of right knee, not elsewhere classified: Secondary | ICD-10-CM | POA: Diagnosis not present

## 2017-03-11 DIAGNOSIS — M25561 Pain in right knee: Secondary | ICD-10-CM | POA: Diagnosis not present

## 2017-03-11 DIAGNOSIS — Z96651 Presence of right artificial knee joint: Secondary | ICD-10-CM | POA: Diagnosis not present

## 2017-03-12 DIAGNOSIS — M25561 Pain in right knee: Secondary | ICD-10-CM | POA: Diagnosis not present

## 2017-03-12 DIAGNOSIS — M25661 Stiffness of right knee, not elsewhere classified: Secondary | ICD-10-CM | POA: Diagnosis not present

## 2017-03-12 DIAGNOSIS — Z96651 Presence of right artificial knee joint: Secondary | ICD-10-CM | POA: Diagnosis not present

## 2017-03-16 DIAGNOSIS — M25561 Pain in right knee: Secondary | ICD-10-CM | POA: Diagnosis not present

## 2017-03-16 DIAGNOSIS — Z96651 Presence of right artificial knee joint: Secondary | ICD-10-CM | POA: Diagnosis not present

## 2017-03-16 DIAGNOSIS — M25661 Stiffness of right knee, not elsewhere classified: Secondary | ICD-10-CM | POA: Diagnosis not present

## 2017-03-17 DIAGNOSIS — M25661 Stiffness of right knee, not elsewhere classified: Secondary | ICD-10-CM | POA: Diagnosis not present

## 2017-03-17 DIAGNOSIS — M25561 Pain in right knee: Secondary | ICD-10-CM | POA: Diagnosis not present

## 2017-03-17 DIAGNOSIS — Z96651 Presence of right artificial knee joint: Secondary | ICD-10-CM | POA: Diagnosis not present

## 2017-03-19 DIAGNOSIS — Z96651 Presence of right artificial knee joint: Secondary | ICD-10-CM | POA: Diagnosis not present

## 2017-03-19 DIAGNOSIS — M25561 Pain in right knee: Secondary | ICD-10-CM | POA: Diagnosis not present

## 2017-03-19 DIAGNOSIS — M25661 Stiffness of right knee, not elsewhere classified: Secondary | ICD-10-CM | POA: Diagnosis not present

## 2017-03-23 DIAGNOSIS — M25661 Stiffness of right knee, not elsewhere classified: Secondary | ICD-10-CM | POA: Diagnosis not present

## 2017-03-23 DIAGNOSIS — Z96651 Presence of right artificial knee joint: Secondary | ICD-10-CM | POA: Diagnosis not present

## 2017-03-23 DIAGNOSIS — M25561 Pain in right knee: Secondary | ICD-10-CM | POA: Diagnosis not present

## 2017-03-31 DIAGNOSIS — M25561 Pain in right knee: Secondary | ICD-10-CM | POA: Diagnosis not present

## 2017-03-31 DIAGNOSIS — M25661 Stiffness of right knee, not elsewhere classified: Secondary | ICD-10-CM | POA: Diagnosis not present

## 2017-03-31 DIAGNOSIS — Z96651 Presence of right artificial knee joint: Secondary | ICD-10-CM | POA: Diagnosis not present

## 2017-04-02 DIAGNOSIS — Z96651 Presence of right artificial knee joint: Secondary | ICD-10-CM | POA: Diagnosis not present

## 2017-04-02 DIAGNOSIS — M25661 Stiffness of right knee, not elsewhere classified: Secondary | ICD-10-CM | POA: Diagnosis not present

## 2017-04-02 DIAGNOSIS — M25561 Pain in right knee: Secondary | ICD-10-CM | POA: Diagnosis not present

## 2017-04-09 DIAGNOSIS — M25561 Pain in right knee: Secondary | ICD-10-CM | POA: Diagnosis not present

## 2017-04-09 DIAGNOSIS — Z96651 Presence of right artificial knee joint: Secondary | ICD-10-CM | POA: Diagnosis not present

## 2017-04-09 DIAGNOSIS — M25661 Stiffness of right knee, not elsewhere classified: Secondary | ICD-10-CM | POA: Diagnosis not present

## 2017-04-13 DIAGNOSIS — Z471 Aftercare following joint replacement surgery: Secondary | ICD-10-CM | POA: Diagnosis not present

## 2017-04-13 DIAGNOSIS — Z96651 Presence of right artificial knee joint: Secondary | ICD-10-CM | POA: Diagnosis not present

## 2017-04-13 DIAGNOSIS — M25561 Pain in right knee: Secondary | ICD-10-CM | POA: Diagnosis not present

## 2017-05-11 DIAGNOSIS — Z96651 Presence of right artificial knee joint: Secondary | ICD-10-CM | POA: Diagnosis not present

## 2017-05-11 DIAGNOSIS — M25561 Pain in right knee: Secondary | ICD-10-CM | POA: Diagnosis not present

## 2017-05-11 DIAGNOSIS — Z471 Aftercare following joint replacement surgery: Secondary | ICD-10-CM | POA: Diagnosis not present

## 2017-07-09 ENCOUNTER — Encounter: Payer: Self-pay | Admitting: Primary Care

## 2017-07-09 DIAGNOSIS — Z1231 Encounter for screening mammogram for malignant neoplasm of breast: Secondary | ICD-10-CM | POA: Diagnosis not present

## 2017-07-27 ENCOUNTER — Encounter: Payer: Self-pay | Admitting: Primary Care

## 2017-07-27 ENCOUNTER — Ambulatory Visit (INDEPENDENT_AMBULATORY_CARE_PROVIDER_SITE_OTHER): Payer: Medicare HMO | Admitting: Primary Care

## 2017-07-27 VITALS — BP 128/86 | HR 81 | Temp 98.1°F | Ht 64.0 in | Wt 164.2 lb

## 2017-07-27 DIAGNOSIS — I10 Essential (primary) hypertension: Secondary | ICD-10-CM | POA: Diagnosis not present

## 2017-07-27 DIAGNOSIS — Z96651 Presence of right artificial knee joint: Secondary | ICD-10-CM | POA: Diagnosis not present

## 2017-07-27 DIAGNOSIS — G4700 Insomnia, unspecified: Secondary | ICD-10-CM

## 2017-07-27 DIAGNOSIS — M1711 Unilateral primary osteoarthritis, right knee: Secondary | ICD-10-CM

## 2017-07-27 DIAGNOSIS — F411 Generalized anxiety disorder: Secondary | ICD-10-CM | POA: Diagnosis not present

## 2017-07-27 DIAGNOSIS — R69 Illness, unspecified: Secondary | ICD-10-CM | POA: Diagnosis not present

## 2017-07-27 DIAGNOSIS — F4323 Adjustment disorder with mixed anxiety and depressed mood: Secondary | ICD-10-CM

## 2017-07-27 DIAGNOSIS — M25561 Pain in right knee: Secondary | ICD-10-CM | POA: Diagnosis not present

## 2017-07-27 MED ORDER — HYDROXYZINE HCL 10 MG PO TABS: 10.0000 mg | ORAL_TABLET | Freq: Two times a day (BID) | ORAL | 0 refills | Status: DC | PRN

## 2017-07-27 MED ORDER — TRAZODONE HCL 50 MG PO TABS: 25.0000 mg | ORAL_TABLET | Freq: Every evening | ORAL | 0 refills | Status: DC | PRN

## 2017-07-27 NOTE — Assessment & Plan Note (Signed)
Mostly anxiety, also with insomnia.  Long discussion today regarding best practice for daily anxiety symptoms and insomnia.  This discussion included that benzos are not best practice for this type of anxiety and that I will not be prescribing.  She was initially very reticent about coming off of her alprazolam, but did agree in the end.  Will wean off alprazolam over the next 2 weeks.  She has "failed" treatment on numerous other medications therefore will start hydroxyzine as needed for panic attacks/anxiety.  Will also start low-dose trazodone to use during the second week of her weaning from Xanax.  Instructions provided.

## 2017-07-27 NOTE — Assessment & Plan Note (Signed)
We will start weaning from benzos over the next 2 weeks.  Scription for low-dose trazodone sent to pharmacy.  Instructions provided to start during the second week of weaning off of alprazolam.  She will update in a few weeks.  We will not prescribe benzos for insomnia or acute anxiety, she verbalized understanding.

## 2017-07-27 NOTE — Progress Notes (Signed)
Subjective:    Patient ID: [name], female    DOB: February 03, 1951, 67 y.o.   MRN: [id number]  HPI  [name] is a 67 year old female who presents today to transfer care from Dr. Deborra Medina.  Her physical is scheduled for April 2019.  1) Essential Hypertension: Currently managed on amlodipine-benazepril 5-20 mg. She does not check her BP at home. She denies chest pain, dizziness, headaches.   BP Readings from Last 3 Encounters:  07/27/17 128/86  02/19/17 (!) 150/55  02/10/17 (!) 134/59    2) Hyperlipidemia: Currently not managed on medication as she is intolerant to statin medications. Last lipid panel In March 2018 with TC of 257, LDL of 193. She is signed up to have labs in April 2019.  3) Osteoarthritis: Underwent total knee arthroplasty on 02/17/2017. Currently following with orthopedics and has a follow up appointment today.   4) GAD: Currently managed on Alprazolam 0.25 mg (1.5 tablets every night at bedtime, and 1/2 tablets 1-2 times weekly) for which she's taken for years. She was once seeing a therapist and psychiatrist in the past, no recent visit.   She has "allergies and intolerance" to several medications including Zoloft and Buspar which caused increase in anxiety.  She endorses that she is tried "every medication" for her symptoms without improvement.  She endorses daily anxiety since her husband had a psychotic breakdown in 2012. She mostly has difficulty trouble falling asleep which is why she takes her Xanax. She once had problems with waking at 3 am with anxiety and nightmares intermittently.   Review of Systems  Respiratory: Negative for shortness of breath.   Cardiovascular: Negative for chest pain.  Musculoskeletal: Positive for arthralgias.  Neurological: Negative for dizziness and headaches.  Psychiatric/Behavioral: Positive for sleep disturbance. The patient is nervous/anxious.        Past Medical History:  Diagnosis Date  . Anxiety   . Arnold-Chiari  malformation ([place])   . Arthritis   . H. pylori infection    3 years ago  . Incontinence   . Syncope and collapse    Per pt, denies passing out  . Tobacco use disorder   . Unspecified essential hypertension      Social History   Socioeconomic History  . Marital status: Married    Spouse name: Not on file  . Number of children: 2  . Years of education: Not on file  . Highest education level: Not on file  Social Needs  . Financial resource strain: Not on file  . Food insecurity - worry: Not on file  . Food insecurity - inability: Not on file  . Transportation needs - medical: Not on file  . Transportation needs - non-medical: Not on file  Occupational History  . Occupation: hair dresser  Tobacco Use  . Smoking status: Current Every Day Smoker    Packs/day: 1.00    Years: 50.00    Pack years: 50.00    Types: Cigarettes  . Smokeless tobacco: Never Used  Substance and Sexual Activity  . Alcohol use: No    Alcohol/week: 0.0 oz  . Drug use: No  . Sexual activity: Not on file  Other Topics Concern  . Not on file  Social History Narrative  . Not on file    Past Surgical History:  Procedure Laterality Date  . APPENDECTOMY    . ARNOLD CHIARI SURGERY     neurocranial surgery  . BLEPHAROPLASTY     Bil  . CHOLECYSTECTOMY    .  MASS EXCISION Right 12/27/2013   Procedure: MINOR EXCISION OF RIGHT THUMB MUCOID CYST, DEBRIDEMENT OF INTERPHALANGEAL JOINT;  Surgeon: Cammie Sickle, MD;  Location: [facility];  Service: Orthopedics;  Laterality: Right;  . mass on thumb  right  . TOTAL KNEE ARTHROPLASTY Right 02/17/2017  . TOTAL KNEE ARTHROPLASTY Right 02/17/2017   Procedure: TOTAL KNEE ARTHROPLASTY;  Surgeon: Melrose Nakayama, MD;  Location: [facility];  Service: Orthopedics;  Laterality: Right;  . TUBAL LIGATION      Family History  Problem Relation Age of Onset  . Bone cancer Mother   . Heart disease Mother   . Cancer Sister        metastatic; unknown primary  .  Diverticulosis Sister   . Tuberculosis Father   . Diabetes Sister   . Hypertension Sister   . Colon cancer Neg Hx   . Esophageal cancer Neg Hx   . Pancreatic cancer Neg Hx   . Stomach cancer Neg Hx   . Liver disease Neg Hx   . Kidney disease Neg Hx     Allergies  Allergen Reactions  . Shellfish-Derived Products Anaphylaxis  . Ativan [Lorazepam] Other (See Comments)    Makes "skin crawl" and insomnia  . Buspar [Buspirone] Nausea Only    Sweating and dizzy  . Clarithromycin Swelling  . Codeine Nausea And Vomiting  . Doxycycline Other (See Comments)    Unknown  . Guaifenesin Other (See Comments)    Palpitations  . Oxycodone Nausea And Vomiting  . Prednisone Nausea And Vomiting and Other (See Comments)    Makes my heart race  . Statins Other (See Comments)    Muscle pain  . Sulfonamide Derivatives Nausea And Vomiting    Achiness  . Tetracycline Nausea Only  . Vicodin [Hydrocodone-Acetaminophen] Nausea And Vomiting  . Famotidine Rash  . Latex Rash  . Omeprazole Nausea Only    Cough, shortness of breath - patient doesn't remember  . Peanut-Containing Drug Products Itching and Rash    Current Outpatient Medications on File Prior to Visit  Medication Sig Dispense Refill  . acetaminophen (TYLENOL) 500 MG tablet Take 1,000 mg by mouth 2 (two) times daily as needed (pain).    [name] amLODipine-benazepril (LOTREL) 5-20 MG capsule Take 1 capsule by mouth daily. 30 capsule 3  . loratadine (CLARITIN) 10 MG tablet Take 10 mg by mouth daily as needed for allergies.     . ALPRAZolam (XANAX) 0.25 MG tablet TAKE ONE TABLET BY MOUTH THREE TIMES DAILY AS NEEDED (Patient not taking: Reported on 07/27/2017) 90 tablet 3   No current facility-administered medications on file prior to visit.     BP 128/86   Pulse 81   Temp 98.1 F (36.7 C) (Oral)   Ht 5\' 4"  (1.626 m)   Wt 164 lb 4 oz (74.5 kg)   SpO2 95%   BMI 28.19 kg/m    Objective:   Physical Exam  Constitutional: She appears  well-nourished.  Neck: Neck supple.  Cardiovascular: Normal rate and regular rhythm.  Pulmonary/Chest: Effort normal and breath sounds normal.  Skin: Skin is warm and dry.  Psychiatric: She has a normal mood and affect.          Assessment & Plan:

## 2017-07-27 NOTE — Assessment & Plan Note (Signed)
Following with orthopedics.  Total right knee arthroplasty in September 2018.

## 2017-07-27 NOTE — Patient Instructions (Addendum)
We've got to wean you off of your alprazolam slowly.  Start by only taking 1 tablet by mouth at bedtime for 1 week, then 1/2 tablet by mouth at bedtime for 1 week then stop.  Start Trazodone 50 mg tablets for difficulty sleeping. Take 1 tablet by mouth at bedtime starting when you are on the 1/2 tablet of Xanax.  You may try using the hydroxyzine medication as needed for panic attacks/anxiety. Caution as this may cause drowsiness.  Please call me if you have any questions.  It was a pleasure meeting you!

## 2017-07-27 NOTE — Assessment & Plan Note (Signed)
Stable in the office today, continue amlodipine-benazepril.  BMP due soon, will obtain this during her upcoming physical.

## 2017-08-05 ENCOUNTER — Telehealth: Payer: Self-pay | Admitting: Primary Care

## 2017-08-06 DIAGNOSIS — R69 Illness, unspecified: Secondary | ICD-10-CM | POA: Diagnosis not present

## 2017-08-12 NOTE — Telephone Encounter (Signed)
Place prior auth for Hydroxyzine electronically through Cover My Meds.  Received responded that prior auth has bee approve. Referral number: [id number]  Effective Date: 06/14/2017  Expiration Date 06/15/2018

## 2017-09-07 ENCOUNTER — Other Ambulatory Visit: Payer: Self-pay | Admitting: Primary Care

## 2017-09-07 DIAGNOSIS — I10 Essential (primary) hypertension: Secondary | ICD-10-CM

## 2017-09-07 NOTE — Telephone Encounter (Signed)
Ok to refill? Electronically refill request for amLODipine-benazepril (LOTREL) 5-20 MG capsule  Last prescribed by Dr Deborra Medina on 07/16/2015. Transfer to [facility] on 07/27/2017.

## 2017-09-07 NOTE — Telephone Encounter (Signed)
Refill sent to pharmacy. CPE due in April 2019.

## 2017-09-11 ENCOUNTER — Telehealth: Payer: Self-pay | Admitting: Primary Care

## 2017-09-11 DIAGNOSIS — I10 Essential (primary) hypertension: Secondary | ICD-10-CM

## 2017-09-11 DIAGNOSIS — E7801 Familial hypercholesterolemia: Secondary | ICD-10-CM

## 2017-09-11 DIAGNOSIS — E538 Deficiency of other specified B group vitamins: Secondary | ICD-10-CM

## 2017-09-11 DIAGNOSIS — R5383 Other fatigue: Secondary | ICD-10-CM

## 2017-09-11 NOTE — Telephone Encounter (Signed)
Noted, labs ordered for CPE which include B12 check.

## 2017-09-11 NOTE — Telephone Encounter (Signed)
Copied from [facility]. Topic: Inquiry >> Sep 10, 2017  3:35 PM Scherrie Gerlach wrote: Reason for CRM: pt states she used to take b12 injections, and has been feeling really tired lately.  Would like to have order for b 2 lab to be added to her cpx labs on 4/3  >> Sep 10, 2017  3:39 PM Helene Shoe, LPN wrote: Pt has lab appt on 09/16/17 for CPX labs.Please advise.

## 2017-09-16 ENCOUNTER — Other Ambulatory Visit (INDEPENDENT_AMBULATORY_CARE_PROVIDER_SITE_OTHER): Payer: Medicare HMO

## 2017-09-16 DIAGNOSIS — R5383 Other fatigue: Secondary | ICD-10-CM | POA: Diagnosis not present

## 2017-09-16 DIAGNOSIS — I10 Essential (primary) hypertension: Secondary | ICD-10-CM | POA: Diagnosis not present

## 2017-09-16 DIAGNOSIS — E538 Deficiency of other specified B group vitamins: Secondary | ICD-10-CM

## 2017-09-16 DIAGNOSIS — E7801 Familial hypercholesterolemia: Secondary | ICD-10-CM

## 2017-09-16 LAB — COMPREHENSIVE METABOLIC PANEL
ALT: 12 U/L (ref 0–35)
AST: 17 U/L (ref 0–37)
Albumin: 4.1 g/dL (ref 3.5–5.2)
Alkaline Phosphatase: 84 U/L (ref 39–117)
BUN: 10 mg/dL (ref 6–23)
CO2: 30 mEq/L (ref 19–32)
Calcium: 9.3 mg/dL (ref 8.4–10.5)
Chloride: 102 mEq/L (ref 96–112)
Creatinine, Ser: 1.01 mg/dL (ref 0.40–1.20)
GFR: 58.08 mL/min — ABNORMAL LOW (ref >60.00)
Glucose, Bld: 94 mg/dL (ref 70–99)
Potassium: 3.7 mEq/L (ref 3.5–5.1)
Sodium: 139 mEq/L (ref 135–145)
Total Bilirubin: 0.5 mg/dL (ref 0.2–1.2)
Total Protein: 7.2 g/dL (ref 6.0–8.3)

## 2017-09-16 LAB — LIPID PANEL
Cholesterol: 220 mg/dL — ABNORMAL HIGH (ref 0–200)
HDL: 34.9 mg/dL — ABNORMAL LOW (ref >39.00)
LDL Cholesterol: 153 mg/dL — ABNORMAL HIGH (ref 0–99)
NonHDL: 185.23
Total CHOL/HDL Ratio: 6
Triglycerides: 159 mg/dL — ABNORMAL HIGH (ref 0.0–149.0)
VLDL: 31.8 mg/dL (ref 0.0–40.0)

## 2017-09-16 LAB — VITAMIN B12: Vitamin B-12: 286 pg/mL (ref 211–911)

## 2017-09-16 LAB — CBC
HCT: 41.9 % (ref 36.0–46.0)
Hemoglobin: 14.2 g/dL (ref 12.0–15.0)
MCHC: 33.9 g/dL (ref 30.0–36.0)
MCV: 88.5 fl (ref 78.0–100.0)
Platelets: 254 10*3/uL (ref 150.0–400.0)
RBC: 4.74 Mil/uL (ref 3.87–5.11)
RDW: 14.2 % (ref 11.5–15.5)
WBC: 8.7 10*3/uL (ref 4.0–10.5)

## 2017-09-16 LAB — TSH: TSH: 0.66 u[IU]/mL (ref 0.35–4.50)

## 2017-09-21 ENCOUNTER — Ambulatory Visit (INDEPENDENT_AMBULATORY_CARE_PROVIDER_SITE_OTHER): Payer: Medicare HMO | Admitting: Primary Care

## 2017-09-21 ENCOUNTER — Encounter: Payer: Self-pay | Admitting: *Deleted

## 2017-09-21 ENCOUNTER — Encounter: Payer: Self-pay | Admitting: Primary Care

## 2017-09-21 VITALS — BP 122/66 | HR 75 | Temp 98.2°F | Ht 64.0 in | Wt 162.0 lb

## 2017-09-21 DIAGNOSIS — I10 Essential (primary) hypertension: Secondary | ICD-10-CM

## 2017-09-21 DIAGNOSIS — F17200 Nicotine dependence, unspecified, uncomplicated: Secondary | ICD-10-CM | POA: Diagnosis not present

## 2017-09-21 DIAGNOSIS — Z0000 Encounter for general adult medical examination without abnormal findings: Secondary | ICD-10-CM | POA: Diagnosis not present

## 2017-09-21 DIAGNOSIS — F4323 Adjustment disorder with mixed anxiety and depressed mood: Secondary | ICD-10-CM

## 2017-09-21 DIAGNOSIS — R69 Illness, unspecified: Secondary | ICD-10-CM | POA: Diagnosis not present

## 2017-09-21 DIAGNOSIS — E785 Hyperlipidemia, unspecified: Secondary | ICD-10-CM

## 2017-09-21 DIAGNOSIS — G4700 Insomnia, unspecified: Secondary | ICD-10-CM | POA: Diagnosis not present

## 2017-09-21 DIAGNOSIS — K219 Gastro-esophageal reflux disease without esophagitis: Secondary | ICD-10-CM

## 2017-09-21 NOTE — Assessment & Plan Note (Signed)
Stable in the office today, continue amlodipine-benazepril 5-20 mg. BMP unremarkable.

## 2017-09-21 NOTE — Assessment & Plan Note (Signed)
Improved from prior lipid panel in 2018. Discussed to work on diet and start exercising. Repeat lipids in 1 year.

## 2017-09-21 NOTE — Assessment & Plan Note (Signed)
Overall well controlled, taking Zantac occasionally.

## 2017-09-21 NOTE — Assessment & Plan Note (Signed)
Declines Pneumonia vaccination.  Declines lung cancer screening. Mammogram UTD, she will call for records. Declines bone density testing. Recommended to increase water, vegetables, fruit, whole grains, lean protein. Recommended regular exercise. Exam unremarkable. Labs stable.  Follow up in 1 year.

## 2017-09-21 NOTE — Assessment & Plan Note (Signed)
Current smoker for 50 years, is not ready to quit. Declines lung cancer screening exam.

## 2017-09-21 NOTE — Assessment & Plan Note (Signed)
Did not want to take Trazodone as it's an "antidepressant". No use of Xanax since February 2019. Hydroxyzine caused palpitations. Failed several antidepressants in the past which "wired her up". She will try low dose Trazodone and update.

## 2017-09-21 NOTE — Patient Instructions (Signed)
Please have [facility] send me a copy of your mammogram.  I do recommend a bone density test, lung cancer screening CT scan, pneumonia vaccination.   Start exercising. You should be getting 150 minutes of moderate intensity exercise weekly.  Increase consumption of vegetables, fruit, whole grains. Limit Coke, sweet tea. Ensure you are consuming 64 ounces of water daily.  Consider sertraline (Zoloft) daily for anxiety. Try the Trazodone at bedtime and update me.   Follow up in 1 year for your annual exam or sooner if needed.  It was a pleasure to see you today!   Preventive Care 80 Years and Older, Female Preventive care refers to lifestyle choices and visits with your health care provider that can promote health and wellness. What does preventive care include?  A yearly physical exam. This is also called an annual well check.  Dental exams once or twice a year.  Routine eye exams. Ask your health care provider how often you should have your eyes checked.  Personal lifestyle choices, including: ? Daily care of your teeth and gums. ? Regular physical activity. ? Eating a healthy diet. ? Avoiding tobacco and drug use. ? Limiting alcohol use. ? Practicing safe sex. ? Taking low-dose aspirin every day. ? Taking vitamin and mineral supplements as recommended by your health care provider. What happens during an annual well check? The services and screenings done by your health care provider during your annual well check will depend on your age, overall health, lifestyle risk factors, and family history of disease. Counseling Your health care provider may ask you questions about your:  Alcohol use.  Tobacco use.  Drug use.  Emotional well-being.  Home and relationship well-being.  Sexual activity.  Eating habits.  History of falls.  Memory and ability to understand (cognition).  Work and work Statistician.  Reproductive health.  Screening You may have the following  tests or measurements:  Height, weight, and BMI.  Blood pressure.  Lipid and cholesterol levels. These may be checked every 5 years, or more frequently if you are over 23 years old.  Skin check.  Lung cancer screening. You may have this screening every year starting at age 61 if you have a 30-pack-year history of smoking and currently smoke or have quit within the past 15 years.  Fecal occult blood test (FOBT) of the stool. You may have this test every year starting at age 69.  Flexible sigmoidoscopy or colonoscopy. You may have a sigmoidoscopy every 5 years or a colonoscopy every 10 years starting at age 46.  Hepatitis C blood test.  Hepatitis B blood test.  Sexually transmitted disease (STD) testing.  Diabetes screening. This is done by checking your blood sugar (glucose) after you have not eaten for a while (fasting). You may have this done every 1-3 years.  Bone density scan. This is done to screen for osteoporosis. You may have this done starting at age 71.  Mammogram. This may be done every 1-2 years. Talk to your health care provider about how often you should have regular mammograms.  Talk with your health care provider about your test results, treatment options, and if necessary, the need for more tests. Vaccines Your health care provider may recommend certain vaccines, such as:  Influenza vaccine. This is recommended every year.  Tetanus, diphtheria, and acellular pertussis (Tdap, Td) vaccine. You may need a Td booster every 10 years.  Varicella vaccine. You may need this if you have not been vaccinated.  Zoster vaccine. You  may need this after age 51.  Measles, mumps, and rubella (MMR) vaccine. You may need at least one dose of MMR if you were born in 1957 or later. You may also need a second dose.  Pneumococcal 13-valent conjugate (PCV13) vaccine. One dose is recommended after age 65.  Pneumococcal polysaccharide (PPSV23) vaccine. One dose is recommended after  age 61.  Meningococcal vaccine. You may need this if you have certain conditions.  Hepatitis A vaccine. You may need this if you have certain conditions or if you travel or work in places where you may be exposed to hepatitis A.  Hepatitis B vaccine. You may need this if you have certain conditions or if you travel or work in places where you may be exposed to hepatitis B.  Haemophilus influenzae type b (Hib) vaccine. You may need this if you have certain conditions.  Talk to your health care provider about which screenings and vaccines you need and how often you need them. This information is not intended to replace advice given to you by your health care provider. Make sure you discuss any questions you have with your health care provider. Document Released: 06/29/2015 Document Revised: 02/20/2016 Document Reviewed: 04/03/2015 Elsevier Interactive Patient Education  [employer].

## 2017-09-21 NOTE — Progress Notes (Signed)
Subjective:    Patient ID: [name], female    DOB: 09/07/1950, 67 y.o.   MRN: [id number]  HPI  [name] is a 67 year old female who presents today for complete physical.  Immunizations: -Tetanus: Unsure -Influenza: Declines -Pneumonia: Declines -Shingles: Completed Zostavax in 2013  Diet: She endorses a healthy diet. Breakfast: Toast Lunch: Restaurants (sandwich, salad) Dinner: Potato, vegetables, some meat, pasta Snacks: Cereal  Desserts: Occasionally, 2-3 times weekly Beverages: Coke, sweet tea, some water  Exercise: She is not exercising, is active around her home.  Eye exam: Completes annually Dental exam: Completes annually  Colonoscopy: Completed in 2017, due in 2020 Dexa: No recent exam Mammogram: Completed in 2019    Review of Systems  Constitutional: Negative for unexpected weight change.  HENT: Negative for rhinorrhea.   Respiratory: Negative for cough and shortness of breath.   Cardiovascular: Negative for chest pain.  Gastrointestinal: Negative for constipation and diarrhea.  Genitourinary: Negative for difficulty urinating and menstrual problem.  Musculoskeletal: Negative for arthralgias and myalgias.       Chronic knee pain  Skin: Negative for rash.  Allergic/Immunologic: Negative for environmental allergies.  Neurological: Negative for dizziness, numbness and headaches.  Psychiatric/Behavioral:       Intermittent anxiety, insomnia       Past Medical History:  Diagnosis Date  . Anxiety   . Arnold-Chiari malformation ([place])   . Arthritis   . H. pylori infection    3 years ago  . Incontinence   . Syncope and collapse    Per pt, denies passing out  . Tobacco use disorder   . Unspecified essential hypertension      Social History   Socioeconomic History  . Marital status: Married    Spouse name: Not on file  . Number of children: 2  . Years of education: Not on file  . Highest education level: Not on file  Occupational History   . Occupation: Emergency planning/management officer  Social Needs  . Financial resource strain: Not on file  . Food insecurity:    Worry: Not on file    Inability: Not on file  . Transportation needs:    Medical: Not on file    Non-medical: Not on file  Tobacco Use  . Smoking status: Current Every Day Smoker    Packs/day: 1.00    Years: 50.00    Pack years: 50.00    Types: Cigarettes  . Smokeless tobacco: Never Used  Substance and Sexual Activity  . Alcohol use: No    Alcohol/week: 0.0 oz  . Drug use: No  . Sexual activity: Not on file  Lifestyle  . Physical activity:    Days per week: Not on file    Minutes per session: Not on file  . Stress: Not on file  Relationships  . Social connections:    Talks on phone: Not on file    Gets together: Not on file    Attends religious service: Not on file    Active member of club or organization: Not on file    Attends meetings of clubs or organizations: Not on file    Relationship status: Not on file  . Intimate partner violence:    Fear of current or ex partner: Not on file    Emotionally abused: Not on file    Physically abused: Not on file    Forced sexual activity: Not on file  Other Topics Concern  . Not on file  Social History Narrative  .  Not on file    Past Surgical History:  Procedure Laterality Date  . APPENDECTOMY    . ARNOLD CHIARI SURGERY     neurocranial surgery  . BLEPHAROPLASTY     Bil  . CHOLECYSTECTOMY    . MASS EXCISION Right 12/27/2013   Procedure: MINOR EXCISION OF RIGHT THUMB MUCOID CYST, DEBRIDEMENT OF INTERPHALANGEAL JOINT;  Surgeon: Cammie Sickle, MD;  Location: [place];  Service: Orthopedics;  Laterality: Right;  . mass on thumb  right  . TOTAL KNEE ARTHROPLASTY Right 02/17/2017  . TOTAL KNEE ARTHROPLASTY Right 02/17/2017   Procedure: TOTAL KNEE ARTHROPLASTY;  Surgeon: Melrose Nakayama, MD;  Location: [facility];  Service: Orthopedics;  Laterality: Right;  . TUBAL LIGATION      Family History    Problem Relation Age of Onset  . Bone cancer Mother   . Heart disease Mother   . Cancer Sister        metastatic; unknown primary  . Diverticulosis Sister   . Tuberculosis Father   . Diabetes Sister   . Hypertension Sister   . Colon cancer Neg Hx   . Esophageal cancer Neg Hx   . Pancreatic cancer Neg Hx   . Stomach cancer Neg Hx   . Liver disease Neg Hx   . Kidney disease Neg Hx     Allergies  Allergen Reactions  . Shellfish-Derived Products Anaphylaxis  . Ativan [Lorazepam] Other (See Comments)    Makes "skin crawl" and insomnia  . Buspar [Buspirone] Nausea Only    Sweating and dizzy  . Clarithromycin Swelling  . Codeine Nausea And Vomiting  . Doxycycline Other (See Comments)    Unknown  . Guaifenesin Other (See Comments)    Palpitations  . Oxycodone Nausea And Vomiting  . Prednisone Nausea And Vomiting and Other (See Comments)    Makes my heart race  . Statins Other (See Comments)    Muscle pain  . Sulfonamide Derivatives Nausea And Vomiting    Achiness  . Tetracycline Nausea Only  . Vicodin [Hydrocodone-Acetaminophen] Nausea And Vomiting  . Famotidine Rash  . Latex Rash  . Omeprazole Nausea Only    Cough, shortness of breath - patient doesn't remember  . Peanut-Containing Drug Products Itching and Rash  . Wellbutrin [Bupropion] Palpitations    Current Outpatient Medications on File Prior to Visit  Medication Sig Dispense Refill  . acetaminophen (TYLENOL) 500 MG tablet Take 1,000 mg by mouth 2 (two) times daily as needed (pain).    [name] amLODipine-benazepril (LOTREL) 5-20 MG capsule TAKE ONE CAPSULE BY MOUTH DAILY 90 capsule 3  . loratadine (CLARITIN) 10 MG tablet Take 10 mg by mouth daily as needed for allergies.     . traZODone (DESYREL) 50 MG tablet Take 0.5-1 tablets (25-50 mg total) by mouth at bedtime as needed for sleep. (Patient not taking: Reported on 09/21/2017) 30 tablet 0   No current facility-administered medications on file prior to visit.     BP  122/66   Pulse 75   Temp 98.2 F (36.8 C) (Oral)   Ht 5\' 4"  (1.626 m)   Wt 162 lb (73.5 kg)   SpO2 97%   BMI 27.81 kg/m    Objective:   Physical Exam  Constitutional: She is oriented to person, place, and time. She appears well-nourished.  HENT:  Right Ear: Tympanic membrane and ear canal normal.  Left Ear: Tympanic membrane and ear canal normal.  Nose: Nose normal.  Mouth/Throat: Oropharynx is clear and  moist.  Eyes: Pupils are equal, round, and reactive to light. Conjunctivae and EOM are normal.  Neck: Neck supple. No thyromegaly present.  Cardiovascular: Normal rate and regular rhythm.  No murmur heard. Pulmonary/Chest: Effort normal and breath sounds normal. She has no rales.  Abdominal: Soft. Bowel sounds are normal. There is no tenderness.  Lymphadenopathy:    She has no cervical adenopathy.  Neurological: She is alert and oriented to person, place, and time. She has normal reflexes. No cranial nerve deficit.  Skin: Skin is warm and dry. No rash noted.  Psychiatric: She has a normal mood and affect.          Assessment & Plan:

## 2017-09-21 NOTE — Assessment & Plan Note (Signed)
No use of Xanax since February 2019, no problems with withdrawal. Discussed other medications for anxiety including Zoloft, Lexapro, she kindly declines as she doesn't want to take something daily. She will trial Trazodone HS PRN.

## 2017-09-22 DIAGNOSIS — M7062 Trochanteric bursitis, left hip: Secondary | ICD-10-CM | POA: Diagnosis not present

## 2017-09-22 DIAGNOSIS — M545 Low back pain: Secondary | ICD-10-CM | POA: Diagnosis not present

## 2017-10-14 ENCOUNTER — Ambulatory Visit: Payer: Medicare HMO | Admitting: Primary Care

## 2017-10-19 DIAGNOSIS — M533 Sacrococcygeal disorders, not elsewhere classified: Secondary | ICD-10-CM | POA: Diagnosis not present

## 2017-10-20 ENCOUNTER — Telehealth: Payer: Self-pay | Admitting: *Deleted

## 2017-10-20 NOTE — Telephone Encounter (Signed)
Noted and agree. I do not support recurrent benzo use. I'm happy to see her in the office anytime to discuss her symptoms.

## 2017-10-20 NOTE — Telephone Encounter (Signed)
Spoke to pt who states she was upset after seeing [facility] and she would not prescribe pts xanax. She reports she has had anxiety since she was 67yrs old and this has been her medication of choice. Pt recently established with Carlis Jackson, as a transfer from Dr Deborra Medina. I explained to the pt that it is up to each provider, individually, as to how pts are treated and what meds are prescribed. Pt states she takes 1/2 tab of .25mg  appx 3xs/week, to aid with sleep. Pt was prescribed Trazodone, in Feb, which she has not taken, at all. Advised pt to try med to see if it assist with s/s, and if not, she may want to either f/u with [facility] to discuss further, or she may want to consider following Dr Deborra Medina to [facility] as she is familiar with her.

## 2017-10-20 NOTE — Telephone Encounter (Signed)
Copied from [place] [id number]. Topic: General - Other >> Oct 20, 2017  3:53 PM Carolyn Stare wrote:  Pt would like a call back from office manager  Would not discuss why. [phone]

## 2018-01-11 DIAGNOSIS — M25561 Pain in right knee: Secondary | ICD-10-CM | POA: Diagnosis not present

## 2018-01-11 DIAGNOSIS — M545 Low back pain: Secondary | ICD-10-CM | POA: Diagnosis not present

## 2018-01-11 DIAGNOSIS — Z96651 Presence of right artificial knee joint: Secondary | ICD-10-CM | POA: Diagnosis not present

## 2018-01-18 DIAGNOSIS — M545 Low back pain: Secondary | ICD-10-CM | POA: Diagnosis not present

## 2018-01-25 DIAGNOSIS — M533 Sacrococcygeal disorders, not elsewhere classified: Secondary | ICD-10-CM | POA: Diagnosis not present

## 2018-02-02 DIAGNOSIS — M533 Sacrococcygeal disorders, not elsewhere classified: Secondary | ICD-10-CM | POA: Diagnosis not present

## 2018-02-11 DIAGNOSIS — H2513 Age-related nuclear cataract, bilateral: Secondary | ICD-10-CM | POA: Diagnosis not present

## 2018-02-11 DIAGNOSIS — H33103 Unspecified retinoschisis, bilateral: Secondary | ICD-10-CM | POA: Diagnosis not present

## 2018-02-11 DIAGNOSIS — H524 Presbyopia: Secondary | ICD-10-CM | POA: Diagnosis not present

## 2018-02-11 DIAGNOSIS — H40013 Open angle with borderline findings, low risk, bilateral: Secondary | ICD-10-CM | POA: Diagnosis not present

## 2018-02-11 DIAGNOSIS — H3509 Other intraretinal microvascular abnormalities: Secondary | ICD-10-CM | POA: Diagnosis not present

## 2018-02-26 NOTE — Progress Notes (Signed)
[facility] Note  03/01/2018     CHIEF COMPLAINT Patient presents for Retina Evaluation   HISTORY OF PRESENT ILLNESS: [name] is a 67 y.o. female who presents to the clinic today for:   HPI    Retina Evaluation    In both eyes.  This started 2 weeks ago.  Associated Symptoms Photophobia.  Negative for Blind Spot, Glare, Shoulder/Hip pain, Fatigue, Jaw Claudication, Distortion, Floaters, Redness, Scalp Tenderness, Weight Loss, Fever, Trauma, Pain and Flashes.  Context:  distance vision, mid-range vision and near vision.  Treatments tried include surgery.  Response to treatment was significant improvement.  I, the attending physician,  performed the HPI with the patient and updated documentation appropriately.          Comments    Referral of Dr. Hinton Lovely for retina eval. Patient states she had yearly eye exam and was advise she needed retinal. Evaluation.Pt states she has light sensitivity ou, denies flashes, floaters and ocular pain. Pt states she was told she had beginning stage of  cataracts OU. Pt reports she had eye lid sx 2012 by DR. Best.Denies gtt's/vit's        Last edited by Bernarda Caffey, MD on 03/01/2018  2:40 PM. 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 Aron Baba for retinoschisis OU LEE: 08.29.19 (K. Hallahan) [BCVA: OD: 20/20 OS: 20/20-] Ocular Hx-cataracts OU, glaucoma suspect OU PMH-anxiety, HTN, current smoker    CURRENT MEDICATIONS: No current outpatient medications on file. (Ophthalmic Drugs)   No current facility-administered medications for this visit.  (Ophthalmic Drugs)   Current Outpatient Medications (Other)  Medication Sig  . acetaminophen (TYLENOL) 500 MG tablet Take 1,000 mg by mouth 2 (two) times daily as needed (pain).  [name] amLODipine-benazepril  (LOTREL) 5-20 MG capsule TAKE ONE CAPSULE BY MOUTH DAILY  . loratadine (CLARITIN) 10 MG tablet Take 10 mg by mouth daily as needed for allergies.   . traZODone (DESYREL) 50 MG tablet Take 0.5-1 tablets (25-50 mg total) by mouth at bedtime as needed for sleep. (Patient not taking: Reported on 09/21/2017)   No current facility-administered medications for this visit.  (Other)      REVIEW OF SYSTEMS: ROS    Positive for: Eyes   Negative for: Constitutional, Gastrointestinal, Neurological, Skin, Genitourinary, Musculoskeletal, HENT, Endocrine, Cardiovascular, Respiratory, Psychiatric, Allergic/Imm, Heme/Lymph   Last edited by Zenovia Jordan, LPN on 4/68/0321  2:24 PM. (History)       ALLERGIES Allergies  Allergen Reactions  . Shellfish-Derived Products Anaphylaxis  . Ativan [Lorazepam] Other (See Comments)    Makes "skin crawl" and insomnia  . Buspar [Buspirone] Nausea Only    Sweating and dizzy  . Clarithromycin Swelling  . Codeine Nausea And Vomiting  . Doxycycline Other (See Comments)    Unknown  . Guaifenesin Other (See Comments)    Palpitations  . Oxycodone Nausea And Vomiting  . Prednisone Nausea And Vomiting and Other (See Comments)    Makes my heart race  . Statins Other (See Comments)    Muscle pain  . Sulfonamide Derivatives Nausea And Vomiting    Achiness  . Tetracycline Nausea Only  . Vicodin [Hydrocodone-Acetaminophen] Nausea And Vomiting  . Famotidine Rash  . Latex Rash  . Omeprazole Nausea Only    Cough, shortness of breath - patient doesn't remember  .  Peanut-Containing Drug Products Itching and Rash  . Wellbutrin [Bupropion] Palpitations    PAST MEDICAL HISTORY Past Medical History:  Diagnosis Date  . Anxiety   . Arnold-Chiari malformation ([place])   . Arthritis   . H. pylori infection    3 years ago  . Incontinence   . Syncope and collapse    Per pt, denies passing out  . Tobacco use disorder   . Unspecified essential hypertension    Past  Surgical History:  Procedure Laterality Date  . APPENDECTOMY    . ARNOLD CHIARI SURGERY     neurocranial surgery  . BLEPHAROPLASTY     Bil  . C-EYE SURGERY PROCEDURE    . CHOLECYSTECTOMY    . EYE SURGERY    . MASS EXCISION Right 12/27/2013   Procedure: MINOR EXCISION OF RIGHT THUMB MUCOID CYST, DEBRIDEMENT OF INTERPHALANGEAL JOINT;  Surgeon: Cammie Sickle, MD;  Location: [place];  Service: Orthopedics;  Laterality: Right;  . mass on thumb  right  . TOTAL KNEE ARTHROPLASTY Right 02/17/2017  . TOTAL KNEE ARTHROPLASTY Right 02/17/2017   Procedure: TOTAL KNEE ARTHROPLASTY;  Surgeon: Melrose Nakayama, MD;  Location: [place];  Service: Orthopedics;  Laterality: Right;  . TUBAL LIGATION      FAMILY HISTORY Family History  Problem Relation Age of Onset  . Bone cancer Mother   . Heart disease Mother   . Cancer Sister        metastatic; unknown primary  . Diverticulosis Sister   . Tuberculosis Father   . Diabetes Sister   . Hypertension Sister   . Colon cancer Neg Hx   . Esophageal cancer Neg Hx   . Pancreatic cancer Neg Hx   . Stomach cancer Neg Hx   . Liver disease Neg Hx   . Kidney disease Neg Hx     SOCIAL HISTORY Social History   Tobacco Use  . Smoking status: Current Every Day Smoker    Packs/day: 1.00    Years: 50.00    Pack years: 50.00    Types: Cigarettes  . Smokeless tobacco: Never Used  Substance Use Topics  . Alcohol use: No    Alcohol/week: 0.0 standard drinks  . Drug use: No         OPHTHALMIC EXAM:  Base Eye Exam    Visual Acuity (Snellen - Linear)      Right Left   Dist cc 20/30 20/25 -2   Dist ph cc NI NI   Correction:  Glasses  Pt did not bring recent Rx Glass       Tonometry (Tonopen, 1:10 PM)      Right Left   Pressure 16 16       Pupils      Dark Light Shape React APD   Right 4 3 Round Brisk None   Left 4 3 Round Brisk None       Visual Fields (Counting fingers)      Left Right    Full Full        Extraocular Movement      Right Left    Full, Ortho Full, Ortho       Neuro/Psych    Oriented x3:  Yes   Mood/Affect:  Normal       Dilation    Both eyes:  1.0% Mydriacyl, 2.5% Phenylephrine @ 1:11 PM        Slit Lamp and Fundus Exam    Slit Lamp Exam  Right Left   Lids/Lashes Dermatochalasis - upper lid, Meibomian gland dysfunction Dermatochalasis - upper lid, Meibomian gland dysfunction   Conjunctiva/Sclera Nasal and Temporal Pinguecula Nasal and Temporal Pinguecula   Cornea Arcus Arcus, mild inferior endo pigment   Anterior Chamber Deep, Narrow angles Deep, clear, Temporal Narrow angle   Iris Round and dilated Round and dilated   Lens 2+ Nuclear sclerosis, 2+ Cortical cataract 2+ Nuclear sclerosis, 2+ Cortical cataract   Vitreous Vitreous syneresis Vitreous syneresis       Fundus Exam      Right Left   Disc Pink and Sharp, vascular loops temporally Pink and Sharp   C/D Ratio 0.5 0.65   Macula Good foveal reflex, dot hemorrhages, no edema, +druplets Good foveal reflex, Retinal pigment epithelial mottling / +druplets, No heme or edema   Vessels Vascular attenuation, AV crossing changes, Tortuous; refractile plaque at arteriolar bifurcation superior to fovea mid-zone - good distal flow/perfusion Vascular attenuation, AV crossing changes, Tortuous   Periphery Attached, inferior reticular degeneration, retinoschisis cavity inferiorly from 0600 to 0730, No RT/RD on 360 scleral depressed exam Attached, retinoschisis cavity from 0400 to 0600, inferior reticular degeneration, RPE mottling , No RT/RD on 360 scleral depressed exam        Refraction    Wearing Rx      Sphere Cylinder Axis Add   Right +0.75 +0.75 158 +1.75   Left +0.25 +0.50 032 +1.75   Age:  3 yrs   Type:  PAL       Manifest Refraction (Over)      Sphere Cylinder Axis Dist VA   Right [place] +1.25 163 20/25+2   Left +0.50 +1.00 025 20/25-1          IMAGING AND PROCEDURES  Imaging and Procedures for  @TODAY @  OCT, Retina - OU - Both Eyes       Right Eye Quality was good. Central Foveal Thickness: 283. Progression has no prior data. Findings include normal foveal contour, no IRF, no SRF, retinal drusen .   Left Eye Quality was good. Central Foveal Thickness: 264. Progression has no prior data. Findings include normal foveal contour, no IRF, no SRF, retinal drusen .   Notes *Images captured and stored on drive  Diagnosis / Impression:  Inferotemporal retinoschisis OU Retinal drusen OU   Clinical management:  See below  Abbreviations: NFP - Normal foveal profile. CME - cystoid macular edema. PED - pigment epithelial detachment. IRF - intraretinal fluid. SRF - subretinal fluid. EZ - ellipsoid zone. ERM - epiretinal membrane. ORA - outer retinal atrophy. ORT - outer retinal tubulation. SRHM - subretinal hyper-reflective material                  ASSESSMENT/PLAN:    ICD-10-CM   1. 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 Retinal edema H35.81 OCT, Retina - OU - Both Eyes  3. 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 Essential hypertension I10   6. Hypertensive retinopathy of both eyes H35.033   7. 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 Retinoschisis OU-  - bullous, inferotemporal retinoschisis cavities OU (OD > OS) - schisis confirmed by widefield OCT - no retinal tears or holes on depressed exam - fairly large cavity with significant posterior extension -- will monitor closely - discussed findings, prognosis and possible need for treatment if schisis cavity threatens macula - F/U in  6-8 wks, for repeat DFE/OCT/Optos  2. No retinal edema on exam or OCT  3. Retinal drusen OU - mild OCT findings - monitor  4. Refractile arteriolar plaque superiorly OD - located at 1oclock midzone at arteriolar bifurcation - good distal perfusion - monitor for now  5,6. Hypertensive retinopathy OU -  discussed importance of tight BP control - monitor  7. Combined form age-related cataract OU-  - The symptoms of cataract, surgical options, and treatments and risks were discussed with patient. - discussed diagnosis and progression - not yet visually significant - monitor for now   Ophthalmic Meds Ordered this visit:  No orders of the defined types were placed in this encounter.      Return for F/U Retinoschisis OU, DFE, OCT.  There are no Patient Instructions on file for this visit.   Explained the diagnoses, plan, and follow up with the patient and they expressed understanding.  Patient expressed understanding of the importance of proper follow up care.   This document serves as a record of services personally performed by Gardiner Sleeper, MD, PhD. It was created on their behalf by Ernest Mallick, OA, an ophthalmic assistant. The creation of this record is the provider's dictation and/or activities during the visit.    Electronically signed by: Ernest Mallick, OA  09.13.19 3:01 PM   This document serves as a record of services personally performed by Gardiner Sleeper, MD, PhD. It was created on their behalf by Catha Brow, [place], a certified ophthalmic assistant. The creation of this record is the provider's dictation and/or activities during the visit.  Electronically signed by: Catha Brow, COA  09.16.19 3:01 PM   Gardiner Sleeper, M.D., Ph.D. Diseases & Surgery of the Retina and Vitreous [facility]   I have reviewed the above documentation for accuracy and completeness, and I agree with the above. Gardiner Sleeper, M.D., Ph.D. 03/01/18 3:01 PM     Abbreviations: M myopia (nearsighted); A astigmatism; H hyperopia (farsighted); P presbyopia; Mrx spectacle prescription;  CTL contact lenses; OD right eye; OS left eye; OU both eyes  XT exotropia; ET esotropia; PEK punctate epithelial keratitis; PEE punctate epithelial erosions; DES dry eye syndrome;  MGD meibomian gland dysfunction; ATs artificial tears; PFAT's preservative free artificial tears; [place] nuclear sclerotic cataract; PSC posterior subcapsular cataract; ERM epi-retinal membrane; PVD posterior vitreous detachment; RD retinal detachment; DM diabetes mellitus; DR diabetic retinopathy; NPDR non-proliferative diabetic retinopathy; PDR proliferative diabetic retinopathy; CSME clinically significant macular edema; DME diabetic macular edema; dbh dot blot hemorrhages; CWS cotton wool spot; POAG primary open angle glaucoma; C/D cup-to-disc ratio; HVF humphrey visual field; GVF goldmann visual field; OCT optical coherence tomography; IOP intraocular pressure; BRVO Branch retinal vein occlusion; CRVO central retinal vein occlusion; CRAO central retinal artery occlusion; BRAO branch retinal artery occlusion; RT retinal tear; SB scleral buckle; PPV pars plana vitrectomy; VH Vitreous hemorrhage; PRP panretinal laser photocoagulation; IVK intravitreal kenalog; VMT vitreomacular traction; MH Macular hole;  NVD neovascularization of the disc; NVE neovascularization elsewhere; AREDS age related eye disease study; ARMD age related macular degeneration; POAG primary open angle glaucoma; EBMD epithelial/anterior basement membrane dystrophy; ACIOL anterior chamber intraocular lens; IOL intraocular lens; PCIOL posterior chamber intraocular lens; Phaco/IOL phacoemulsification with intraocular lens placement; [place] photorefractive keratectomy; LASIK laser assisted in situ keratomileusis; HTN hypertension; DM diabetes mellitus; COPD chronic obstructive pulmonary disease

## 2018-03-01 ENCOUNTER — Ambulatory Visit (INDEPENDENT_AMBULATORY_CARE_PROVIDER_SITE_OTHER): Payer: Medicare HMO | Admitting: Ophthalmology

## 2018-03-01 ENCOUNTER — Encounter (INDEPENDENT_AMBULATORY_CARE_PROVIDER_SITE_OTHER): Payer: Self-pay | Admitting: Ophthalmology

## 2018-03-01 DIAGNOSIS — H25813 Combined forms of age-related cataract, bilateral: Secondary | ICD-10-CM

## 2018-03-01 DIAGNOSIS — H35363 Drusen (degenerative) of macula, bilateral: Secondary | ICD-10-CM | POA: Diagnosis not present

## 2018-03-01 DIAGNOSIS — H35033 Hypertensive retinopathy, bilateral: Secondary | ICD-10-CM | POA: Diagnosis not present

## 2018-03-01 DIAGNOSIS — H3581 Retinal edema: Secondary | ICD-10-CM | POA: Diagnosis not present

## 2018-03-01 DIAGNOSIS — H33103 Unspecified retinoschisis, bilateral: Secondary | ICD-10-CM

## 2018-03-01 DIAGNOSIS — I708 Atherosclerosis of other arteries: Secondary | ICD-10-CM

## 2018-03-01 DIAGNOSIS — I10 Essential (primary) hypertension: Secondary | ICD-10-CM

## 2018-03-01 DIAGNOSIS — H3509 Other intraretinal microvascular abnormalities: Secondary | ICD-10-CM

## 2018-04-22 NOTE — Progress Notes (Signed)
[facility] Note  04/26/2018     CHIEF COMPLAINT Patient presents for Retina Follow Up   HISTORY OF PRESENT ILLNESS: [name] is a 67 y.o. female who presents to the clinic today for:   HPI    Retina Follow Up    Patient presents with  Other.  In both eyes.  This started 3 months ago.  Severity is mild.  Since onset it is stable.  I, the attending physician,  performed the HPI with the patient and updated documentation appropriately.          Comments    F/U Retinoschisis OU. Patient states "MY vision is good", denies new visual onsets/issues.        Last edited by Bernarda Caffey, MD on 04/26/2018  1:24 PM. (History)    pt states she has no problem seeing out of either eye  Referring physician: Pleas Koch, NP [place], [place] [address]  HISTORICAL INFORMATION:   Selected notes from the MEDICAL RECORD NUMBER Referred by Dr. Aron Baba for retinoschisis OU LEE: 08.29.19 (K. Hallahan) [BCVA: OD: 20/20 OS: 20/20-] Ocular Hx-cataracts OU, glaucoma suspect OU PMH-anxiety, HTN, current smoker    CURRENT MEDICATIONS: No current outpatient medications on file. (Ophthalmic Drugs)   No current facility-administered medications for this visit.  (Ophthalmic Drugs)   Current Outpatient Medications (Other)  Medication Sig  . acetaminophen (TYLENOL) 500 MG tablet Take 1,000 mg by mouth 2 (two) times daily as needed (pain).  [name] amLODipine-benazepril (LOTREL) 5-20 MG capsule TAKE ONE CAPSULE BY MOUTH DAILY  . loratadine (CLARITIN) 10 MG tablet Take 10 mg by mouth daily as needed for allergies.   . traZODone (DESYREL) 50 MG tablet Take 0.5-1 tablets (25-50 mg total) by mouth at bedtime as needed for sleep.   No current facility-administered medications for this visit.  (Other)      REVIEW OF SYSTEMS: ROS    Positive for: Eyes   Negative for: Constitutional, Gastrointestinal, Neurological, Skin, Genitourinary,  Musculoskeletal, HENT, Endocrine, Cardiovascular, Respiratory, Psychiatric, Allergic/Imm, Heme/Lymph   Last edited by Zenovia Jordan, LPN on 05/39/7673  4:19 PM. (History)       ALLERGIES Allergies  Allergen Reactions  . Shellfish-Derived Products Anaphylaxis  . Ativan [Lorazepam] Other (See Comments)    Makes "skin crawl" and insomnia  . Buspar [Buspirone] Nausea Only    Sweating and dizzy  . Clarithromycin Swelling  . Codeine Nausea And Vomiting  . Doxycycline Other (See Comments)    Unknown  . Guaifenesin Other (See Comments)    Palpitations  . Oxycodone Nausea And Vomiting  . Prednisone Nausea And Vomiting and Other (See Comments)    Makes my heart race  . Statins Other (See Comments)    Muscle pain  . Sulfonamide Derivatives Nausea And Vomiting    Achiness  . Tetracycline Nausea Only  . Vicodin [Hydrocodone-Acetaminophen] Nausea And Vomiting  . Famotidine Rash  . Latex Rash  . Omeprazole Nausea Only    Cough, shortness of breath - patient doesn't remember  . Peanut-Containing Drug Products Itching and Rash  . Wellbutrin [Bupropion] Palpitations    PAST MEDICAL HISTORY Past Medical History:  Diagnosis Date  . Anxiety   . Arnold-Chiari malformation ([place])   . Arthritis   . H. pylori infection    3 years ago  . Incontinence   . Syncope and collapse    Per pt, denies passing out  . Tobacco use disorder   .  Unspecified essential hypertension    Past Surgical History:  Procedure Laterality Date  . APPENDECTOMY    . ARNOLD CHIARI SURGERY     neurocranial surgery  . BLEPHAROPLASTY     Bil  . C-EYE SURGERY PROCEDURE    . CHOLECYSTECTOMY    . EYE SURGERY    . MASS EXCISION Right 12/27/2013   Procedure: MINOR EXCISION OF RIGHT THUMB MUCOID CYST, DEBRIDEMENT OF INTERPHALANGEAL JOINT;  Surgeon: Cammie Sickle, MD;  Location: [facility];  Service: Orthopedics;  Laterality: Right;  . mass on thumb  right  . TOTAL KNEE ARTHROPLASTY Right  02/17/2017  . TOTAL KNEE ARTHROPLASTY Right 02/17/2017   Procedure: TOTAL KNEE ARTHROPLASTY;  Surgeon: Melrose Nakayama, MD;  Location: [facility];  Service: Orthopedics;  Laterality: Right;  . TUBAL LIGATION      FAMILY HISTORY Family History  Problem Relation Age of Onset  . Bone cancer Mother   . Heart disease Mother   . Cancer Sister        metastatic; unknown primary  . Diverticulosis Sister   . Tuberculosis Father   . Diabetes Sister   . Hypertension Sister   . Colon cancer Neg Hx   . Esophageal cancer Neg Hx   . Pancreatic cancer Neg Hx   . Stomach cancer Neg Hx   . Liver disease Neg Hx   . Kidney disease Neg Hx     SOCIAL HISTORY Social History   Tobacco Use  . Smoking status: Current Every Day Smoker    Packs/day: 1.00    Years: 50.00    Pack years: 50.00    Types: Cigarettes  . Smokeless tobacco: Never Used  Substance Use Topics  . Alcohol use: No    Alcohol/week: 0.0 standard drinks  . Drug use: No         OPHTHALMIC EXAM:  Base Eye Exam    Visual Acuity (Snellen - Linear)      Right Left   Dist cc 20/25 -2 20/25   Dist ph cc 20/25 -1 NI   Correction:  Glasses       Tonometry (Tonopen, 1:09 PM)      Right Left   Pressure 15 16       Pupils      Dark Light Shape React APD   Right 4 3 Round Brisk None   Left 4 3 Round Brisk None       Visual Fields      Left Right    Full Full       Extraocular Movement      Right Left    Full, Ortho Full, Ortho       Neuro/Psych    Oriented x3:  Yes   Mood/Affect:  Normal       Dilation    Both eyes:  1.0% Mydriacyl, 2.5% Phenylephrine @ 1:09 PM        Slit Lamp and Fundus Exam    Slit Lamp Exam      Right Left   Lids/Lashes Dermatochalasis - upper lid, Meibomian gland dysfunction Dermatochalasis - upper lid, Meibomian gland dysfunction   Conjunctiva/Sclera Nasal and Temporal Pinguecula Nasal and Temporal Pinguecula   Cornea Arcus Arcus, mild inferior endo pigment   Anterior Chamber Deep,  Narrow angles Deep, clear, Temporal Narrow angle   Iris Round and dilated Round and dilated   Lens 2+ Nuclear sclerosis, 2+ Cortical cataract 2+ Nuclear sclerosis, 2+ Cortical cataract   Vitreous Vitreous syneresis  Vitreous syneresis       Fundus Exam      Right Left   Disc Pink and Sharp, vascular loops temporally Pink and Sharp   C/D Ratio 0.5 0.65   Macula Good foveal reflex, dot hemorrhages, no edema, +druplets Good foveal reflex, Retinal pigment epithelial mottling / +druplets, No heme or edema   Vessels Vascular attenuation, AV crossing changes, Tortuous; refractile plaque at arteriolar bifurcation superior to fovea mid-zone - good distal flow/perfusion Vascular attenuation, AV crossing changes, Tortuous   Periphery Attached, inferior reticular degeneration, retinoschisis cavity inferiorly from 0600 to 0730, No RT/RD on 360 scleral depressed exam Attached, retinoschisis cavity from 0400 to 0600, inferior reticular degeneration, RPE mottling , No RT/RD on 360 scleral depressed exam          IMAGING AND PROCEDURES  Imaging and Procedures for @TODAY @  OCT, Retina - OU - Both Eyes       Right Eye Quality was good. Central Foveal Thickness: 284. Progression has no prior data. Findings include normal foveal contour, no IRF, no SRF, retinal drusen  (Inferotemporal retinoschisis caught on widefield OCT - stable).   Left Eye Quality was good. Central Foveal Thickness: 264. Progression has no prior data. Findings include normal foveal contour, no IRF, no SRF, retinal drusen  (Inferotemporal retinoschisis caught on widefield OCT).   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 CME - cystoid macular edema. PED - pigment epithelial detachment. IRF - intraretinal fluid. SRF - subretinal fluid. EZ -  ellipsoid zone. ERM - epiretinal membrane. ORA - outer retinal atrophy. ORT - outer retinal tubulation. SRHM - subretinal hyper-reflective material                  ASSESSMENT/PLAN:    ICD-10-CM   1. 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 Retinal edema H35.81 OCT, Retina - OU - Both Eyes  3. 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 Essential hypertension I10   6. Hypertensive retinopathy of both eyes H35.033   7. 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 Retinoschisis OU-  - bullous, inferotemporal retinoschisis cavities OU (OD > OS) -- no significant change from prior - schisis confirmed by widefield OCT - no retinal tears or holes on depressed exam - fairly large cavity with significant posterior extension -- will monitor closely - discussed findings, prognosis and possible need for treatment if schisis cavity threatens macula - F/U in 4 months, for repeat DFE/OCT/Optos  2. No retinal edema on exam or OCT  3. Retinal drusen OU - mild OCT findings - monitor  4. Refractile arteriolar plaque superiorly OD - located at 1oclock midzone at arteriolar bifurcation - good distal perfusion - monitor for now  5,6. Hypertensive retinopathy OU - discussed importance of tight BP control - monitor  7. Combined form age-related cataract OU-  - The symptoms of cataract, surgical options, and treatments and risks were discussed with patient. - discussed diagnosis and progression - not yet visually significant - monitor for now   Ophthalmic Meds Ordered this visit:  No orders of the defined types were placed in this encounter.      Return in about 6 months (around 10/25/2018) for Dilated Exam, OCT.  There are no Patient Instructions on file for this visit.   Explained the diagnoses, plan, and follow up with the patient and they expressed understanding.  Patient expressed understanding of the  importance of proper follow up care.   This document serves as a record of services personally performed by Gardiner Sleeper, MD, PhD. It was created on their behalf by Ernest Mallick, OA, an ophthalmic assistant. The creation of this record is the provider's dictation and/or activities during the visit.    Electronically signed by: Ernest Mallick, OA  11.07.19 1:34 PM    Gardiner Sleeper, M.D., Ph.D. Diseases & Surgery of the Retina and Vitreous [facility]   I have reviewed the above documentation for accuracy and completeness, and I agree with the above. Gardiner Sleeper, M.D., Ph.D. 04/26/18 1:59 PM      Abbreviations: M myopia (nearsighted); A astigmatism; H hyperopia (farsighted); P presbyopia; Mrx spectacle prescription;  CTL contact lenses; OD right eye; OS left eye; OU both eyes  XT exotropia; ET esotropia; PEK punctate epithelial keratitis; PEE punctate epithelial erosions; DES dry eye syndrome; MGD meibomian gland dysfunction; ATs artificial tears; PFAT's preservative free artificial tears; [place] nuclear sclerotic cataract; PSC posterior subcapsular cataract; ERM epi-retinal membrane; PVD posterior vitreous detachment; RD retinal detachment; DM diabetes mellitus; DR diabetic retinopathy; NPDR non-proliferative diabetic retinopathy; PDR proliferative diabetic retinopathy; CSME clinically significant macular edema; DME diabetic macular edema; dbh dot blot hemorrhages; CWS cotton wool spot; POAG primary open angle glaucoma; C/D cup-to-disc ratio; HVF humphrey visual field; GVF goldmann visual field; OCT optical coherence tomography; IOP intraocular pressure; BRVO Branch retinal vein occlusion; CRVO central retinal vein occlusion; CRAO central retinal artery occlusion; BRAO branch retinal artery occlusion; RT retinal tear; SB scleral buckle; PPV pars plana vitrectomy; VH Vitreous hemorrhage; PRP panretinal laser photocoagulation; IVK intravitreal kenalog; VMT vitreomacular  traction; MH Macular hole;  NVD neovascularization of the disc; NVE neovascularization elsewhere; AREDS age related eye disease study; ARMD age related macular degeneration; POAG primary open angle glaucoma; EBMD epithelial/anterior basement membrane dystrophy; ACIOL anterior chamber intraocular lens; IOL intraocular lens; PCIOL posterior chamber intraocular lens; Phaco/IOL phacoemulsification with intraocular lens placement; [place] photorefractive keratectomy; LASIK laser assisted in situ keratomileusis; HTN hypertension; DM diabetes mellitus; COPD chronic obstructive pulmonary disease

## 2018-04-26 ENCOUNTER — Encounter (INDEPENDENT_AMBULATORY_CARE_PROVIDER_SITE_OTHER): Payer: Self-pay | Admitting: Ophthalmology

## 2018-04-26 ENCOUNTER — Ambulatory Visit (INDEPENDENT_AMBULATORY_CARE_PROVIDER_SITE_OTHER): Payer: Medicare HMO | Admitting: Ophthalmology

## 2018-04-26 DIAGNOSIS — H35363 Drusen (degenerative) of macula, bilateral: Secondary | ICD-10-CM | POA: Diagnosis not present

## 2018-04-26 DIAGNOSIS — H35033 Hypertensive retinopathy, bilateral: Secondary | ICD-10-CM | POA: Diagnosis not present

## 2018-04-26 DIAGNOSIS — I10 Essential (primary) hypertension: Secondary | ICD-10-CM

## 2018-04-26 DIAGNOSIS — H25813 Combined forms of age-related cataract, bilateral: Secondary | ICD-10-CM | POA: Diagnosis not present

## 2018-04-26 DIAGNOSIS — I708 Atherosclerosis of other arteries: Secondary | ICD-10-CM

## 2018-04-26 DIAGNOSIS — H3509 Other intraretinal microvascular abnormalities: Secondary | ICD-10-CM

## 2018-04-26 DIAGNOSIS — H3581 Retinal edema: Secondary | ICD-10-CM

## 2018-04-26 DIAGNOSIS — H33103 Unspecified retinoschisis, bilateral: Secondary | ICD-10-CM

## 2018-07-07 DIAGNOSIS — Z0100 Encounter for examination of eyes and vision without abnormal findings: Secondary | ICD-10-CM | POA: Diagnosis not present

## 2018-07-07 DIAGNOSIS — H524 Presbyopia: Secondary | ICD-10-CM | POA: Diagnosis not present

## 2018-07-22 ENCOUNTER — Encounter: Payer: Self-pay | Admitting: Gastroenterology

## 2018-07-23 ENCOUNTER — Encounter: Payer: Self-pay | Admitting: Gastroenterology

## 2018-07-26 DIAGNOSIS — Z1231 Encounter for screening mammogram for malignant neoplasm of breast: Secondary | ICD-10-CM | POA: Diagnosis not present

## 2018-07-26 LAB — HM MAMMOGRAPHY

## 2018-07-27 ENCOUNTER — Encounter: Payer: Self-pay | Admitting: Primary Care

## 2018-08-26 ENCOUNTER — Encounter: Payer: Self-pay | Admitting: Gastroenterology

## 2018-08-26 ENCOUNTER — Other Ambulatory Visit: Payer: Self-pay

## 2018-08-26 ENCOUNTER — Ambulatory Visit (AMBULATORY_SURGERY_CENTER): Payer: Self-pay

## 2018-08-26 VITALS — Ht 64.0 in | Wt 165.8 lb

## 2018-08-26 DIAGNOSIS — Z86010 Personal history of colonic polyps: Secondary | ICD-10-CM

## 2018-08-26 MED ORDER — NA SULFATE-K SULFATE-MG SULF 17.5-3.13-1.6 GM/177ML PO SOLN: 1.0000 | Freq: Once | ORAL | 0 refills | Status: AC

## 2018-08-26 NOTE — Progress Notes (Signed)
Denies allergies to eggs or soy products. Denies complication of anesthesia or sedation. Denies use of weight loss medication. Denies use of O2.   Emmi instructions declined.   A pay no more than 50.00 coupon for Suprep was given to the patient.

## 2018-08-29 NOTE — Progress Notes (Signed)
[facility] Note  08/30/2018     CHIEF COMPLAINT Patient presents for Retina Follow Up   HISTORY OF PRESENT ILLNESS: [name] is a 68 y.o. female who presents to the clinic today for:   HPI    Retina Follow Up    Patient presents with  Other.  In both eyes.  This started 6 months ago.  Severity is mild.  Duration of 4 months.  Since onset it is stable.  I, the attending physician,  performed the HPI with the patient and updated documentation appropriately.          Comments    68 y/o female pt here for 4 mo f/u for retinoschisis OU.  No change in [place] OU.  Denies pain, flashes.  Has a few small floaters OU.  No gtts.       Last edited by Bernarda Caffey, MD on 09/01/2018 12:17 AM. (History)    pt states she is having no problems with her vision   Referring physician: Pleas Koch, NP [place], [place] [address]  HISTORICAL INFORMATION:   Selected notes from the MEDICAL RECORD NUMBER Referred by Dr. Aron Baba for retinoschisis OU LEE: 08.29.19 (K. Hallahan) [BCVA: OD: 20/20 OS: 20/20-] Ocular Hx-cataracts OU, glaucoma suspect OU PMH-anxiety, HTN, current smoker    CURRENT MEDICATIONS: No current outpatient medications on file. (Ophthalmic Drugs)   No current facility-administered medications for this visit.  (Ophthalmic Drugs)   Current Outpatient Medications (Other)  Medication Sig  . acetaminophen (TYLENOL) 500 MG tablet Take 1,000 mg by mouth 2 (two) times daily as needed (pain).  [name] amLODipine-benazepril (LOTREL) 5-20 MG capsule TAKE ONE CAPSULE BY MOUTH DAILY  . loratadine (CLARITIN) 10 MG tablet Take 10 mg by mouth daily as needed for allergies.    No current facility-administered medications for this visit.  (Other)      REVIEW OF SYSTEMS: ROS    Positive for: Gastrointestinal, Musculoskeletal, Eyes   Negative for: Constitutional, Neurological, Skin, Genitourinary, HENT, Endocrine, Cardiovascular,  Respiratory, Psychiatric, Allergic/Imm, Heme/Lymph   Last edited by Matthew Folks, COA on 08/30/2018 12:56 PM. (History)       ALLERGIES Allergies  Allergen Reactions  . Shellfish-Derived Products Anaphylaxis  . Ativan [Lorazepam] Other (See Comments)    Makes "skin crawl" and insomnia  . Buspar [Buspirone] Nausea Only    Sweating and dizzy  . Clarithromycin Swelling  . Codeine Nausea And Vomiting  . Doxycycline Other (See Comments)    Unknown  . Guaifenesin Other (See Comments)    Palpitations  . Oxycodone Nausea And Vomiting  . Prednisone Nausea And Vomiting and Other (See Comments)    Makes my heart race  . Statins Other (See Comments)    Muscle pain  . Sulfonamide Derivatives Nausea And Vomiting    Achiness  . Tetracycline Nausea Only  . Vicodin [Hydrocodone-Acetaminophen] Nausea And Vomiting  . Famotidine Rash  . Latex Rash  . Omeprazole Nausea Only    Cough, shortness of breath - patient doesn't remember  . Peanut-Containing Drug Products Itching and Rash  . Wellbutrin [Bupropion] Palpitations    PAST MEDICAL HISTORY Past Medical History:  Diagnosis Date  . Allergy   . Anxiety   . Arnold-Chiari malformation ([place])   . Arthritis   . GERD (gastroesophageal reflux disease)   . H. pylori infection    3 years ago  . Hyperlipidemia   . Incontinence   . Syncope  and collapse    Per pt, denies passing out  . Tobacco use disorder   . Unspecified essential hypertension    Past Surgical History:  Procedure Laterality Date  . APPENDECTOMY    . ARNOLD CHIARI SURGERY     neurocranial surgery  . BLEPHAROPLASTY     Bil  . C-EYE SURGERY PROCEDURE    . CHOLECYSTECTOMY    . EYE SURGERY    . MASS EXCISION Right 12/27/2013   Procedure: MINOR EXCISION OF RIGHT THUMB MUCOID CYST, DEBRIDEMENT OF INTERPHALANGEAL JOINT;  Surgeon: Cammie Sickle, MD;  Location: [facility];  Service: Orthopedics;  Laterality: Right;  . mass on thumb  right  . TOTAL KNEE  ARTHROPLASTY Right 02/17/2017  . TOTAL KNEE ARTHROPLASTY Right 02/17/2017   Procedure: TOTAL KNEE ARTHROPLASTY;  Surgeon: Melrose Nakayama, MD;  Location: [facility];  Service: Orthopedics;  Laterality: Right;  . TUBAL LIGATION      FAMILY HISTORY Family History  Problem Relation Age of Onset  . Bone cancer Mother   . Heart disease Mother   . Cancer Sister        metastatic; unknown primary  . Diverticulosis Sister   . Tuberculosis Father   . Diabetes Sister   . Hypertension Sister   . Colon cancer Neg Hx   . Esophageal cancer Neg Hx   . Pancreatic cancer Neg Hx   . Stomach cancer Neg Hx   . Liver disease Neg Hx   . Kidney disease Neg Hx   . Rectal cancer Neg Hx     SOCIAL HISTORY Social History   Tobacco Use  . Smoking status: Current Every Day Smoker    Packs/day: 1.00    Years: 50.00    Pack years: 50.00    Types: Cigarettes  . Smokeless tobacco: Never Used  Substance Use Topics  . Alcohol use: No    Alcohol/week: 0.0 standard drinks  . Drug use: No         OPHTHALMIC EXAM:  Base Eye Exam    Visual Acuity (Snellen - Linear)      Right Left   Dist cc 20/25 20/25   Dist ph cc NI NI   Correction:  Glasses       Tonometry (Tonopen, 12:58 PM)      Right Left   Pressure 15 15       Pupils      Dark Light Shape React APD   Right 4 3 Round Brisk None   Left 4 3 Round Brisk None       Visual Fields (Counting fingers)      Left Right    Full Full       Extraocular Movement      Right Left    Full, Ortho Full, Ortho       Neuro/Psych    Oriented x3:  Yes   Mood/Affect:  Normal       Dilation    Both eyes:  1.0% Mydriacyl, 2.5% Phenylephrine @ 12:58 PM        Slit Lamp and Fundus Exam    Slit Lamp Exam      Right Left   Lids/Lashes Dermatochalasis - upper lid, Meibomian gland dysfunction Dermatochalasis - upper lid, Meibomian gland dysfunction   Conjunctiva/Sclera Nasal and Temporal Pinguecula Nasal and Temporal Pinguecula   Cornea Arcus, 1+  Punctate epithelial erosions Arcus, mild inferior endo pigment, 1+ Punctate epithelial erosions   Anterior Chamber Deep, Narrow angles Deep,  clear, Temporal Narrow angle   Iris Round and dilated Round and dilated   Lens 2+ Nuclear sclerosis, 2+ Cortical cataract 2+ Nuclear sclerosis, 2+ Cortical cataract   Vitreous Vitreous syneresis Vitreous syneresis       Fundus Exam      Right Left   Disc Pink and Sharp, vascular loops temporally Pink and Sharp   C/D Ratio 0.5 0.6   Macula blunted foveal reflex, dot hemorrhages, no edema, +druplets blunted foveal reflex, Retinal pigment epithelial mottling / +druplets, No heme or edema   Vessels Vascular attenuation, AV crossing changes, Tortuous; refractile plaque at arteriolar bifurcation superior to fovea mid-zone - good distal flow/perfusion Vascular attenuation, AV crossing changes, Tortuous   Periphery Attached, inferior reticular degeneration, bullous retinoschisis cavity inferiorly from 0600 to 0730, No RT/RD on 360 exam Attached, bullous retinoschisis cavity from 0430 to 0600, inferior reticular degeneration, RPE mottling , No RT/RD on 360 scleral depressed exam          IMAGING AND PROCEDURES  Imaging and Procedures for @TODAY @  OCT, Retina - OU - Both Eyes       Right Eye Quality was good. Central Foveal Thickness: 281. Progression has been stable. Findings include normal foveal contour, no IRF, no SRF, retinal drusen , central retinal atrophy (Inferotemporal retinoschisis caught on widefield OCT - stable).   Left Eye Quality was good. Central Foveal Thickness: 264. Progression has been stable. Findings include normal foveal contour, no IRF, no SRF, retinal drusen  (Inferotemporal retinoschisis caught on widefield OCT).   Notes *Images captured and stored on drive  Diagnosis / Impression:  Inferotemporal retinoschisis OU -- caught on widefield OCT and stable from prior Retinal drusen OU   Clinical management:  See  below  Abbreviations: NFP - Normal foveal profile. CME - cystoid macular edema. PED - pigment epithelial detachment. IRF - intraretinal fluid. SRF - subretinal fluid. EZ - ellipsoid zone. ERM - epiretinal membrane. ORA - outer retinal atrophy. ORT - outer retinal tubulation. SRHM - subretinal hyper-reflective material         Color Fundus Photography Optos - OU - Both Eyes       Right Eye Progression has been stable. Disc findings include normal observations. Macula : drusen, hemorrhage. Vessels : tortuous vessels, attenuated. Periphery : (Bullous retinoschisis cavity from 6-730 extending posteriorly to midzone).   Left Eye Progression has been stable. Disc findings include normal observations. Macula : drusen. Vessels : attenuated, tortuous vessels. Periphery : (Bullous retinoschisis cavity from 430-6 extending posteriorly to midzone).   Notes **Images stored on drive**  Impression: Stable, bullous peripheral retinoschisis OU -- no significant change from prior exam/images                ASSESSMENT/PLAN:    ICD-10-CM   1. 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 Retinal edema H35.81 OCT, Retina - OU - Both Eyes  3. 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 Essential hypertension I10   6. Hypertensive retinopathy of both eyes H35.033   7. 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 Retinoschisis OU-  - bullous, inferotemporal retinoschisis cavities OU (OD > OS) -- no significant change from prior - schisis confirmed by widefield OCT -- stable - no retinal tears or holes on depressed exam - fairly large cavity with significant posterior extension -- will monitor  closely - discussed findings, prognosis and possible need for treatment if schisis cavity threatens macula - F/U in 4-6 months, for repeat DFE/OCT/Optos  2. No retinal edema on exam or OCT  3.  Retinal drusen OU - mild OCT findings - monitor  4. Refractile arteriolar plaque superiorly OD - located at 1oclock midzone at arteriolar bifurcation - good distal perfusion - monitor for now  5,6. Hypertensive retinopathy OU - discussed importance of tight BP control - monitor  7. Combined form age-related cataract OU-  - The symptoms of cataract, surgical options, and treatments and risks were discussed with patient. - discussed diagnosis and progression - not yet visually significant - monitor for now   Ophthalmic Meds Ordered this visit:  No orders of the defined types were placed in this encounter.      Return for f/u 4-6 months retinoschisis OU, DFE, OCT.  There are no Patient Instructions on file for this visit.   Explained the diagnoses, plan, and follow up with the patient and they expressed understanding.  Patient expressed understanding of the importance of proper follow up care.   This document serves as a record of services personally performed by Gardiner Sleeper, MD, PhD. It was created on their behalf by Ernest Mallick, OA, an ophthalmic assistant. The creation of this record is the provider's dictation and/or activities during the visit.    Electronically signed by: Ernest Mallick, OA  03.15.2020 12:23 AM    Gardiner Sleeper, M.D., Ph.D. Diseases & Surgery of the Retina and Vitreous [facility]  I have reviewed the above documentation for accuracy and completeness, and I agree with the above. Gardiner Sleeper, M.D., Ph.D. 09/01/18 12:23 AM   Abbreviations: M myopia (nearsighted); A astigmatism; H hyperopia (farsighted); P presbyopia; Mrx spectacle prescription;  CTL contact lenses; OD right eye; OS left eye; OU both eyes  XT exotropia; ET esotropia; PEK punctate epithelial keratitis; PEE punctate epithelial erosions; DES dry eye syndrome; MGD meibomian gland dysfunction; ATs artificial tears; PFAT's preservative free artificial tears; [place]  nuclear sclerotic cataract; PSC posterior subcapsular cataract; ERM epi-retinal membrane; PVD posterior vitreous detachment; RD retinal detachment; DM diabetes mellitus; DR diabetic retinopathy; NPDR non-proliferative diabetic retinopathy; PDR proliferative diabetic retinopathy; CSME clinically significant macular edema; DME diabetic macular edema; dbh dot blot hemorrhages; CWS cotton wool spot; POAG primary open angle glaucoma; C/D cup-to-disc ratio; HVF humphrey visual field; GVF goldmann visual field; OCT optical coherence tomography; IOP intraocular pressure; BRVO Branch retinal vein occlusion; CRVO central retinal vein occlusion; CRAO central retinal artery occlusion; BRAO branch retinal artery occlusion; RT retinal tear; SB scleral buckle; PPV pars plana vitrectomy; VH Vitreous hemorrhage; PRP panretinal laser photocoagulation; IVK intravitreal kenalog; VMT vitreomacular traction; MH Macular hole;  NVD neovascularization of the disc; NVE neovascularization elsewhere; AREDS age related eye disease study; ARMD age related macular degeneration; POAG primary open angle glaucoma; EBMD epithelial/anterior basement membrane dystrophy; ACIOL anterior chamber intraocular lens; IOL intraocular lens; PCIOL posterior chamber intraocular lens; Phaco/IOL phacoemulsification with intraocular lens placement; [place] photorefractive keratectomy; LASIK laser assisted in situ keratomileusis; HTN hypertension; DM diabetes mellitus; COPD chronic obstructive pulmonary disease

## 2018-08-30 ENCOUNTER — Ambulatory Visit (INDEPENDENT_AMBULATORY_CARE_PROVIDER_SITE_OTHER): Payer: Medicare HMO | Admitting: Ophthalmology

## 2018-08-30 ENCOUNTER — Encounter (INDEPENDENT_AMBULATORY_CARE_PROVIDER_SITE_OTHER): Payer: Self-pay | Admitting: Ophthalmology

## 2018-08-30 ENCOUNTER — Other Ambulatory Visit: Payer: Self-pay

## 2018-08-30 DIAGNOSIS — H3581 Retinal edema: Secondary | ICD-10-CM

## 2018-08-30 DIAGNOSIS — I10 Essential (primary) hypertension: Secondary | ICD-10-CM

## 2018-08-30 DIAGNOSIS — H35033 Hypertensive retinopathy, bilateral: Secondary | ICD-10-CM

## 2018-08-30 DIAGNOSIS — I708 Atherosclerosis of other arteries: Secondary | ICD-10-CM | POA: Diagnosis not present

## 2018-08-30 DIAGNOSIS — H33103 Unspecified retinoschisis, bilateral: Secondary | ICD-10-CM

## 2018-08-30 DIAGNOSIS — H3509 Other intraretinal microvascular abnormalities: Secondary | ICD-10-CM

## 2018-08-30 DIAGNOSIS — H35363 Drusen (degenerative) of macula, bilateral: Secondary | ICD-10-CM

## 2018-08-30 DIAGNOSIS — H25813 Combined forms of age-related cataract, bilateral: Secondary | ICD-10-CM

## 2018-09-01 ENCOUNTER — Encounter (INDEPENDENT_AMBULATORY_CARE_PROVIDER_SITE_OTHER): Payer: Self-pay | Admitting: Ophthalmology

## 2018-09-02 ENCOUNTER — Other Ambulatory Visit: Payer: Self-pay | Admitting: Primary Care

## 2018-09-02 DIAGNOSIS — I10 Essential (primary) hypertension: Secondary | ICD-10-CM

## 2018-09-04 ENCOUNTER — Other Ambulatory Visit: Payer: Self-pay | Admitting: Primary Care

## 2018-09-04 DIAGNOSIS — I10 Essential (primary) hypertension: Secondary | ICD-10-CM

## 2018-09-07 ENCOUNTER — Other Ambulatory Visit: Payer: Self-pay | Admitting: Primary Care

## 2018-09-07 DIAGNOSIS — I10 Essential (primary) hypertension: Secondary | ICD-10-CM

## 2018-09-09 ENCOUNTER — Encounter: Payer: Medicare HMO | Admitting: Gastroenterology

## 2018-10-13 ENCOUNTER — Other Ambulatory Visit: Payer: Self-pay | Admitting: Primary Care

## 2018-10-13 DIAGNOSIS — I10 Essential (primary) hypertension: Secondary | ICD-10-CM

## 2018-11-17 ENCOUNTER — Other Ambulatory Visit: Payer: Self-pay | Admitting: Primary Care

## 2018-11-17 DIAGNOSIS — I10 Essential (primary) hypertension: Secondary | ICD-10-CM

## 2018-12-06 ENCOUNTER — Other Ambulatory Visit: Payer: Self-pay | Admitting: Primary Care

## 2018-12-06 DIAGNOSIS — E538 Deficiency of other specified B group vitamins: Secondary | ICD-10-CM

## 2018-12-06 DIAGNOSIS — E785 Hyperlipidemia, unspecified: Secondary | ICD-10-CM

## 2018-12-06 DIAGNOSIS — I10 Essential (primary) hypertension: Secondary | ICD-10-CM

## 2018-12-09 ENCOUNTER — Other Ambulatory Visit (INDEPENDENT_AMBULATORY_CARE_PROVIDER_SITE_OTHER): Payer: Medicare HMO

## 2018-12-09 DIAGNOSIS — E538 Deficiency of other specified B group vitamins: Secondary | ICD-10-CM | POA: Diagnosis not present

## 2018-12-09 DIAGNOSIS — I10 Essential (primary) hypertension: Secondary | ICD-10-CM | POA: Diagnosis not present

## 2018-12-09 DIAGNOSIS — E785 Hyperlipidemia, unspecified: Secondary | ICD-10-CM

## 2018-12-09 LAB — COMPREHENSIVE METABOLIC PANEL
ALT: 9 U/L (ref 0–35)
AST: 15 U/L (ref 0–37)
Albumin: 4.2 g/dL (ref 3.5–5.2)
Alkaline Phosphatase: 89 U/L (ref 39–117)
BUN: 7 mg/dL (ref 6–23)
CO2: 29 mEq/L (ref 19–32)
Calcium: 8.9 mg/dL (ref 8.4–10.5)
Chloride: 103 mEq/L (ref 96–112)
Creatinine, Ser: 0.98 mg/dL (ref 0.40–1.20)
GFR: 56.37 mL/min — ABNORMAL LOW (ref >60.00)
Glucose, Bld: 94 mg/dL (ref 70–99)
Potassium: 3.6 mEq/L (ref 3.5–5.1)
Sodium: 140 mEq/L (ref 135–145)
Total Bilirubin: 0.5 mg/dL (ref 0.2–1.2)
Total Protein: 6.6 g/dL (ref 6.0–8.3)

## 2018-12-09 LAB — VITAMIN B12: Vitamin B-12: 206 pg/mL — ABNORMAL LOW (ref 211–911)

## 2018-12-09 LAB — LIPID PANEL
Cholesterol: 207 mg/dL — ABNORMAL HIGH (ref 0–200)
HDL: 33.7 mg/dL — ABNORMAL LOW (ref >39.00)
LDL Cholesterol: 144 mg/dL — ABNORMAL HIGH (ref 0–99)
NonHDL: 173.33
Total CHOL/HDL Ratio: 6
Triglycerides: 148 mg/dL (ref 0.0–149.0)
VLDL: 29.6 mg/dL (ref 0.0–40.0)

## 2018-12-13 ENCOUNTER — Ambulatory Visit (INDEPENDENT_AMBULATORY_CARE_PROVIDER_SITE_OTHER): Payer: Medicare HMO | Admitting: Primary Care

## 2018-12-13 ENCOUNTER — Other Ambulatory Visit: Payer: Self-pay

## 2018-12-13 ENCOUNTER — Encounter: Payer: Self-pay | Admitting: Primary Care

## 2018-12-13 VITALS — BP 120/70 | HR 85 | Temp 98.5°F | Ht 64.0 in | Wt 164.0 lb

## 2018-12-13 DIAGNOSIS — F4323 Adjustment disorder with mixed anxiety and depressed mood: Secondary | ICD-10-CM | POA: Diagnosis not present

## 2018-12-13 DIAGNOSIS — K219 Gastro-esophageal reflux disease without esophagitis: Secondary | ICD-10-CM

## 2018-12-13 DIAGNOSIS — R6889 Other general symptoms and signs: Secondary | ICD-10-CM

## 2018-12-13 DIAGNOSIS — I10 Essential (primary) hypertension: Secondary | ICD-10-CM | POA: Diagnosis not present

## 2018-12-13 DIAGNOSIS — G4700 Insomnia, unspecified: Secondary | ICD-10-CM

## 2018-12-13 DIAGNOSIS — N183 Chronic kidney disease, stage 3 unspecified: Secondary | ICD-10-CM | POA: Insufficient documentation

## 2018-12-13 DIAGNOSIS — R69 Illness, unspecified: Secondary | ICD-10-CM | POA: Diagnosis not present

## 2018-12-13 DIAGNOSIS — E538 Deficiency of other specified B group vitamins: Secondary | ICD-10-CM

## 2018-12-13 DIAGNOSIS — E785 Hyperlipidemia, unspecified: Secondary | ICD-10-CM

## 2018-12-13 DIAGNOSIS — Z0000 Encounter for general adult medical examination without abnormal findings: Secondary | ICD-10-CM

## 2018-12-13 DIAGNOSIS — F17200 Nicotine dependence, unspecified, uncomplicated: Secondary | ICD-10-CM

## 2018-12-13 MED ORDER — AMLODIPINE BESY-BENAZEPRIL HCL 5-20 MG PO CAPS: 1.0000 | ORAL_CAPSULE | Freq: Every day | ORAL | 3 refills | Status: DC

## 2018-12-13 NOTE — Progress Notes (Signed)
Subjective:    Patient ID: [name], female    DOB: December 18, 1950, 68 y.o.   MRN: [id number]  HPI  [name] is a 68 year old female who presents today for complete physical.  Immunizations: -Tetanus: Unsure -Influenza: Due this season  -Pneumonia: Never completed, declines  -Shingles: Completed in 2013  Diet: She endorses a fair diet. She eats mostly vegetables, starch, cereal. Doesn't eat sweets very often. She drinks mostly Coke, water, sweet tea. Exercise: She is active at home, no regular exercise.   Eye exam: Completed 2019 Dental exam: Completes semi-annually  Colonoscopy: Completed in 2017, will be rescheduling  Mammogram: Completed in 2020 Dexa: Never completed, declines   BP Readings from Last 3 Encounters:  12/13/18 120/70  09/21/17 122/66  07/27/17 128/86   The 10-year ASCVD risk score Mikey Bussing DC Jr., et al., 2013) is: 17.4%   Values used to calculate the score:     Age: 75 years     Sex: Female     Is Non-Hispanic African American: No     Diabetic: No     Tobacco smoker: Yes     Systolic Blood Pressure: 681 mmHg     Is BP treated: Yes     HDL Cholesterol: 33.7 mg/dL     Total Cholesterol: 207 mg/dL   Review of Systems  Constitutional: Negative for unexpected weight change.  HENT: Negative for rhinorrhea.   Respiratory: Negative for cough and shortness of breath.   Cardiovascular: Negative for chest pain.  Gastrointestinal: Negative for diarrhea.       Intermittent constipation   Endocrine: Positive for cold intolerance.       Cold intolerance for years.  Genitourinary: Negative for difficulty urinating.       Intermittent urine incontinence   Musculoskeletal: Negative for arthralgias and myalgias.  Skin: Negative for rash.  Allergic/Immunologic: Negative for environmental allergies.  Neurological: Negative for dizziness, numbness and headaches.  Psychiatric/Behavioral: The patient is not nervous/anxious.        Past Medical History:   Diagnosis Date  . Allergy   . Anxiety   . Arnold-Chiari malformation ([place])   . Arthritis   . GERD (gastroesophageal reflux disease)   . H. pylori infection    3 years ago  . Hyperlipidemia   . Incontinence   . Syncope and collapse    Per pt, denies passing out  . Tobacco use disorder   . Unspecified essential hypertension      Social History   Socioeconomic History  . Marital status: Married    Spouse name: Not on file  . Number of children: 2  . Years of education: Not on file  . Highest education level: Not on file  Occupational History  . Occupation: Emergency planning/management officer  Social Needs  . Financial resource strain: Not on file  . Food insecurity    Worry: Not on file    Inability: Not on file  . Transportation needs    Medical: Not on file    Non-medical: Not on file  Tobacco Use  . Smoking status: Current Every Day Smoker    Packs/day: 1.00    Years: 50.00    Pack years: 50.00    Types: Cigarettes  . Smokeless tobacco: Never Used  Substance and Sexual Activity  . Alcohol use: No    Alcohol/week: 0.0 standard drinks  . Drug use: No  . Sexual activity: Not on file  Lifestyle  . Physical activity    Days per  week: Not on file    Minutes per session: Not on file  . Stress: Not on file  Relationships  . Social Herbalist on phone: Not on file    Gets together: Not on file    Attends religious service: Not on file    Active member of club or organization: Not on file    Attends meetings of clubs or organizations: Not on file    Relationship status: Not on file  . Intimate partner violence    Fear of current or ex partner: Not on file    Emotionally abused: Not on file    Physically abused: Not on file    Forced sexual activity: Not on file  Other Topics Concern  . Not on file  Social History Narrative  . Not on file    Past Surgical History:  Procedure Laterality Date  . APPENDECTOMY    . ARNOLD CHIARI SURGERY     neurocranial surgery  .  BLEPHAROPLASTY     Bil  . C-EYE SURGERY PROCEDURE    . CHOLECYSTECTOMY    . EYE SURGERY    . MASS EXCISION Right 12/27/2013   Procedure: MINOR EXCISION OF RIGHT THUMB MUCOID CYST, DEBRIDEMENT OF INTERPHALANGEAL JOINT;  Surgeon: Cammie Sickle, MD;  Location: [facility];  Service: Orthopedics;  Laterality: Right;  . mass on thumb  right  . TOTAL KNEE ARTHROPLASTY Right 02/17/2017  . TOTAL KNEE ARTHROPLASTY Right 02/17/2017   Procedure: TOTAL KNEE ARTHROPLASTY;  Surgeon: Melrose Nakayama, MD;  Location: [facility];  Service: Orthopedics;  Laterality: Right;  . TUBAL LIGATION      Family History  Problem Relation Age of Onset  . Bone cancer Mother   . Heart disease Mother   . Cancer Sister        metastatic; unknown primary  . Diverticulosis Sister   . Tuberculosis Father   . Diabetes Sister   . Hypertension Sister   . Colon cancer Neg Hx   . Esophageal cancer Neg Hx   . Pancreatic cancer Neg Hx   . Stomach cancer Neg Hx   . Liver disease Neg Hx   . Kidney disease Neg Hx   . Rectal cancer Neg Hx     Allergies  Allergen Reactions  . Shellfish-Derived Products Anaphylaxis  . Ativan [Lorazepam] Other (See Comments)    Makes "skin crawl" and insomnia  . Buspar [Buspirone] Nausea Only    Sweating and dizzy  . Clarithromycin Swelling  . Codeine Nausea And Vomiting  . Doxycycline Other (See Comments)    Unknown  . Guaifenesin Other (See Comments)    Palpitations  . Oxycodone Nausea And Vomiting  . Prednisone Nausea And Vomiting and Other (See Comments)    Makes my heart race  . Statins Other (See Comments)    Muscle pain  . Sulfonamide Derivatives Nausea And Vomiting    Achiness  . Tetracycline Nausea Only  . Vicodin [Hydrocodone-Acetaminophen] Nausea And Vomiting  . Famotidine Rash  . Latex Rash  . Omeprazole Nausea Only    Cough, shortness of breath - patient doesn't remember  . Peanut-Containing Drug Products Itching and Rash  . Wellbutrin [Bupropion]  Palpitations    Current Outpatient Medications on File Prior to Visit  Medication Sig Dispense Refill  . acetaminophen (TYLENOL) 500 MG tablet Take 1,000 mg by mouth 2 (two) times daily as needed (pain).    [name] loratadine (CLARITIN) 10 MG tablet Take 10 mg  by mouth daily as needed for allergies.      No current facility-administered medications on file prior to visit.     BP 120/70   Pulse 85   Temp 98.5 F (36.9 C) (Tympanic)   Ht 5\' 4"  (1.626 m)   Wt 164 lb (74.4 kg)   SpO2 98%   BMI 28.15 kg/m    Objective:   Physical Exam  Constitutional: She is oriented to person, place, and time. She appears well-nourished.  HENT:  Mouth/Throat: No oropharyngeal exudate.  Eyes: Pupils are equal, round, and reactive to light. EOM are normal.  Neck: Neck supple. No thyromegaly present.  Cardiovascular: Normal rate and regular rhythm.  Respiratory: Effort normal and breath sounds normal.  GI: Soft. Bowel sounds are normal. There is no abdominal tenderness.  Musculoskeletal: Normal range of motion.  Neurological: She is alert and oriented to person, place, and time.  Skin: Skin is warm and dry.  Psychiatric: She has a normal mood and affect.           Assessment & Plan:

## 2018-12-13 NOTE — Assessment & Plan Note (Signed)
Increased with certain foods, managed with Tums once weekly on average. Continue to monitor.

## 2018-12-13 NOTE — Assessment & Plan Note (Signed)
Chronic, anxiety related. She kindly declines treatment for anxiety or insomnia. She will try Melatonin as she's not tried this in the past. She will update.

## 2018-12-13 NOTE — Assessment & Plan Note (Signed)
Overall stable. Managed on ACE, BP under good control. Advised she quit smoking. Continue to monitor.

## 2018-12-13 NOTE — Assessment & Plan Note (Signed)
Recent B12 level of 207, does not take oral B12 or injections. Will have her start with oral B12. Repeat B12 in 3 months

## 2018-12-13 NOTE — Assessment & Plan Note (Signed)
ASCVD risk score of 17% with LDL of 144. She continues to smoke and has a history of hypertension. Long discussion regarding her risks for heart disease/stroke and she kindly declines statin therapy given history of myalgias. She also declines Zetia.

## 2018-12-13 NOTE — Assessment & Plan Note (Signed)
Overall stable, manages well on her own. Mostly has difficulty with sleeping at night. Doesn't like to take anything regularly.  She will try Melatonin for sleep and update.

## 2018-12-13 NOTE — Patient Instructions (Addendum)
Start vitamin B12 1000 mcg tablets. Take 1 tablet once daily.  I recommend bone density testing, lung cancer screening, and cholesterol treatment.  Be sure to work on your diet and get regular exercise.  Ensure you are drinking 64 ounces of water daily.  Schedule a lab only appointment for three months to repeat B12 and check thyroid function.  It was a pleasure to see you today!   Preventive Care 68 Years and Older, Female Preventive care refers to lifestyle choices and visits with your health care provider that can promote health and wellness. This includes:  A yearly physical exam. This is also called an annual well check.  Regular dental and eye exams.  Immunizations.  Screening for certain conditions.  Healthy lifestyle choices, such as diet and exercise. What can I expect for my preventive care visit? Physical exam Your health care provider will check:  Height and weight. These may be used to calculate body mass index (BMI), which is a measurement that tells if you are at a healthy weight.  Heart rate and blood pressure.  Your skin for abnormal spots. Counseling Your health care provider may ask you questions about:  Alcohol, tobacco, and drug use.  Emotional well-being.  Home and relationship well-being.  Sexual activity.  Eating habits.  History of falls.  Memory and ability to understand (cognition).  Work and work environment.  Pregnancy and menstrual history. What immunizations do I need?  Influenza (flu) vaccine  This is recommended every year. Tetanus, diphtheria, and pertussis (Tdap) vaccine  You may need a Td booster every 10 years. Varicella (chickenpox) vaccine  You may need this vaccine if you have not already been vaccinated. Zoster (shingles) vaccine  You may need this after age 60. Pneumococcal conjugate (PCV13) vaccine  One dose is recommended after age 65. Pneumococcal polysaccharide (PPSV23) vaccine  One dose is  recommended after age 65. Measles, mumps, and rubella (MMR) vaccine  You may need at least one dose of MMR if you were born in 1957 or later. You may also need a second dose. Meningococcal conjugate (MenACWY) vaccine  You may need this if you have certain conditions. Hepatitis A vaccine  You may need this if you have certain conditions or if you travel or work in places where you may be exposed to hepatitis A. Hepatitis B vaccine  You may need this if you have certain conditions or if you travel or work in places where you may be exposed to hepatitis B. Haemophilus influenzae type b (Hib) vaccine  You may need this if you have certain conditions. You may receive vaccines as individual doses or as more than one vaccine together in one shot (combination vaccines). Talk with your health care provider about the risks and benefits of combination vaccines. What tests do I need? Blood tests  Lipid and cholesterol levels. These may be checked every 5 years, or more frequently depending on your overall health.  Hepatitis C test.  Hepatitis B test. Screening  Lung cancer screening. You may have this screening every year starting at age 55 if you have a 30-pack-year history of smoking and currently smoke or have quit within the past 15 years.  Colorectal cancer screening. All adults should have this screening starting at age 50 and continuing until age 75. Your health care provider may recommend screening at age 45 if you are at increased risk. You will have tests every 1-10 years, depending on your results and the type of screening test.    Diabetes screening. This is done by checking your blood sugar (glucose) after you have not eaten for a while (fasting). You may have this done every 1-3 years.  Mammogram. This may be done every 1-2 years. Talk with your health care provider about how often you should have regular mammograms.  BRCA-related cancer screening. This may be done if you have a  family history of breast, ovarian, tubal, or peritoneal cancers. Other tests  Sexually transmitted disease (STD) testing.  Bone density scan. This is done to screen for osteoporosis. You may have this done starting at age 45. Follow these instructions at home: Eating and drinking  Eat a diet that includes fresh fruits and vegetables, whole grains, lean protein, and low-fat dairy products. Limit your intake of foods with high amounts of sugar, saturated fats, and salt.  Take vitamin and mineral supplements as recommended by your health care provider.  Do not drink alcohol if your health care provider tells you not to drink.  If you drink alcohol: ? Limit how much you have to 0-1 drink a day. ? Be aware of how much alcohol is in your drink. In the [place] one drink equals one 12 oz bottle of beer (355 mL), one 5 oz glass of wine (148 mL), or one 1 oz glass of hard liquor (44 mL). Lifestyle  Take daily care of your teeth and gums.  Stay active. Exercise for at least 30 minutes on 5 or more days each week.  Do not use any products that contain nicotine or tobacco, such as cigarettes, e-cigarettes, and chewing tobacco. If you need help quitting, ask your health care provider.  If you are sexually active, practice safe sex. Use a condom or other form of protection in order to prevent STIs (sexually transmitted infections).  Talk with your health care provider about taking a low-dose aspirin or statin. What's next?  Go to your health care provider once a year for a well check visit.  Ask your health care provider how often you should have your eyes and teeth checked.  Stay up to date on all vaccines. This information is not intended to replace advice given to you by your health care provider. Make sure you discuss any questions you have with your health care provider. Document Released: 06/29/2015 Document Revised: 05/27/2018 Document Reviewed: 05/27/2018 Elsevier Patient Education   2020 [employer].

## 2018-12-13 NOTE — Assessment & Plan Note (Signed)
Declines pneumonia vaccination despite recommendations. Mammogram UTD. Declines bone density testing. Colonoscopy due this year, she will schedule. Declines lung cancer screening. Exam stable. Labs reviewed. Follow up in 1 year

## 2018-12-13 NOTE — Assessment & Plan Note (Addendum)
Strongly advised she quit smoking, she is not ready. Declines lung cancer screening.

## 2019-02-02 DIAGNOSIS — R69 Illness, unspecified: Secondary | ICD-10-CM | POA: Diagnosis not present

## 2019-03-02 ENCOUNTER — Encounter (INDEPENDENT_AMBULATORY_CARE_PROVIDER_SITE_OTHER): Payer: Self-pay | Admitting: Ophthalmology

## 2019-03-02 ENCOUNTER — Ambulatory Visit (INDEPENDENT_AMBULATORY_CARE_PROVIDER_SITE_OTHER): Payer: Medicare HMO | Admitting: Ophthalmology

## 2019-03-02 ENCOUNTER — Other Ambulatory Visit: Payer: Self-pay

## 2019-03-02 DIAGNOSIS — H35363 Drusen (degenerative) of macula, bilateral: Secondary | ICD-10-CM

## 2019-03-02 DIAGNOSIS — I10 Essential (primary) hypertension: Secondary | ICD-10-CM

## 2019-03-02 DIAGNOSIS — H3509 Other intraretinal microvascular abnormalities: Secondary | ICD-10-CM

## 2019-03-02 DIAGNOSIS — H35033 Hypertensive retinopathy, bilateral: Secondary | ICD-10-CM

## 2019-03-02 DIAGNOSIS — I708 Atherosclerosis of other arteries: Secondary | ICD-10-CM

## 2019-03-02 DIAGNOSIS — H25813 Combined forms of age-related cataract, bilateral: Secondary | ICD-10-CM | POA: Diagnosis not present

## 2019-03-02 DIAGNOSIS — H33103 Unspecified retinoschisis, bilateral: Secondary | ICD-10-CM | POA: Diagnosis not present

## 2019-03-02 DIAGNOSIS — H3581 Retinal edema: Secondary | ICD-10-CM

## 2019-03-02 NOTE — Progress Notes (Signed)
[facility] Note  03/02/2019     CHIEF COMPLAINT Patient presents for Retina Follow Up   HISTORY OF PRESENT ILLNESS: [name] is a 68 y.o. female who presents to the clinic today for:   HPI    Retina Follow Up    Patient presents with  Other.  In both eyes.  Severity is mild.  Duration of 6 months.  Since onset it is stable.  I, the attending physician,  performed the HPI with the patient and updated documentation appropriately.          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 Patient states vision has changed a little and has some light sensitivity.         Last edited by Bernarda Caffey, MD on 03/02/2019  2:09 PM. (History)    pt states she feels like she is more light sensitive than she used to be   Referring physician: Hortencia Pilar, MD [place],  [place] [address]  HISTORICAL INFORMATION:   Selected notes from the MEDICAL RECORD NUMBER Referred by Dr. Aron Baba for retinoschisis OU LEE: 08.29.19 (K. Hallahan) [BCVA: OD: 20/20 OS: 20/20-] Ocular Hx-cataracts OU, glaucoma suspect OU PMH-anxiety, HTN, current smoker    CURRENT MEDICATIONS: No current outpatient medications on file. (Ophthalmic Drugs)   No current facility-administered medications for this visit.  (Ophthalmic Drugs)   Current Outpatient Medications (Other)  Medication Sig  . acetaminophen (TYLENOL) 500 MG tablet Take 1,000 mg by mouth 2 (two) times daily as needed (pain).  [name] amLODipine-benazepril (LOTREL) 5-20 MG capsule Take 1 capsule by mouth daily. For blood pressure.  . loratadine (CLARITIN) 10 MG tablet Take 10 mg by mouth daily as needed for allergies.    No current facility-administered medications for this visit.  (Other)      REVIEW OF SYSTEMS: ROS    Positive for: Gastrointestinal, Musculoskeletal, Eyes   Negative for: Constitutional, Neurological, Skin, Genitourinary, HENT, Endocrine,  Cardiovascular, Respiratory, Psychiatric, Allergic/Imm, Heme/Lymph   Last edited by Elmore Guise, COT on 03/02/2019 12:55 PM. (History)       ALLERGIES Allergies  Allergen Reactions  . Shellfish-Derived Products Anaphylaxis  . Ativan [Lorazepam] Other (See Comments)    Makes "skin crawl" and insomnia  . Buspar [Buspirone] Nausea Only    Sweating and dizzy  . Clarithromycin Swelling  . Codeine Nausea And Vomiting  . Doxycycline Other (See Comments)    Unknown  . Guaifenesin Other (See Comments)    Palpitations  . Oxycodone Nausea And Vomiting  . Prednisone Nausea And Vomiting and Other (See Comments)    Makes my heart race  . Statins Other (See Comments)    Muscle pain  . Sulfonamide Derivatives Nausea And Vomiting    Achiness  . Tetracycline Nausea Only  . Vicodin [Hydrocodone-Acetaminophen] Nausea And Vomiting  . Famotidine Rash  . Latex Rash  . Omeprazole Nausea Only    Cough, shortness of breath - patient doesn't remember  . Peanut-Containing Drug Products Itching and Rash  . Wellbutrin [Bupropion] Palpitations    PAST MEDICAL HISTORY Past Medical History:  Diagnosis Date  . Allergy   . Anxiety   . Arnold-Chiari malformation ([place])   . Arthritis   . GERD (gastroesophageal reflux disease)   . H. pylori infection    3 years ago  . Hyperlipidemia   . Incontinence   . Syncope and collapse    Per  pt, denies passing out  . Tobacco use disorder   . Unspecified essential hypertension    Past Surgical History:  Procedure Laterality Date  . APPENDECTOMY    . ARNOLD CHIARI SURGERY     neurocranial surgery  . BLEPHAROPLASTY     Bil  . C-EYE SURGERY PROCEDURE    . CHOLECYSTECTOMY    . EYE SURGERY    . MASS EXCISION Right 12/27/2013   Procedure: MINOR EXCISION OF RIGHT THUMB MUCOID CYST, DEBRIDEMENT OF INTERPHALANGEAL JOINT;  Surgeon: Cammie Sickle, MD;  Location: [facility];  Service: Orthopedics;  Laterality: Right;  . mass on thumb  right   . TOTAL KNEE ARTHROPLASTY Right 02/17/2017  . TOTAL KNEE ARTHROPLASTY Right 02/17/2017   Procedure: TOTAL KNEE ARTHROPLASTY;  Surgeon: Melrose Nakayama, MD;  Location: [facility];  Service: Orthopedics;  Laterality: Right;  . TUBAL LIGATION      FAMILY HISTORY Family History  Problem Relation Age of Onset  . Bone cancer Mother   . Heart disease Mother   . Cancer Sister        metastatic; unknown primary  . Diverticulosis Sister   . Tuberculosis Father   . Diabetes Sister   . Hypertension Sister   . Colon cancer Neg Hx   . Esophageal cancer Neg Hx   . Pancreatic cancer Neg Hx   . Stomach cancer Neg Hx   . Liver disease Neg Hx   . Kidney disease Neg Hx   . Rectal cancer Neg Hx     SOCIAL HISTORY Social History   Tobacco Use  . Smoking status: Current Every Day Smoker    Packs/day: 1.00    Years: 50.00    Pack years: 50.00    Types: Cigarettes  . Smokeless tobacco: Never Used  Substance Use Topics  . Alcohol use: No    Alcohol/week: 0.0 standard drinks  . Drug use: No         OPHTHALMIC EXAM:  Base Eye Exam    Visual Acuity (Snellen - Linear)      Right Left   Dist cc 20/30 20/20   Dist ph cc 20/20    Correction: Glasses       Tonometry (Tonopen, 1:05 PM)      Right Left   Pressure 16 15       Pupils      Dark Light Shape React APD   Right 4 3 Round Brisk None   Left 4 3 Round Brisk None       Visual Fields (Counting fingers)      Left Right    Full Full       Extraocular Movement      Right Left    Full, Ortho Full, Ortho       Neuro/Psych    Oriented x3: Yes   Mood/Affect: Normal       Dilation    Both eyes: 1.0% Mydriacyl, 2.5% Phenylephrine @ 1:05 PM        Slit Lamp and Fundus Exam    Slit Lamp Exam      Right Left   Lids/Lashes Dermatochalasis - upper lid, Meibomian gland dysfunction Dermatochalasis - upper lid, Meibomian gland dysfunction   Conjunctiva/Sclera Nasal and Temporal Pinguecula Nasal and Temporal Pinguecula   Cornea  Arcus, 1+ Punctate epithelial erosions Arcus, 2+ inferior Punctate epithelial erosions   Anterior Chamber Deep, Narrow angles Deep, clear, Temporal Narrow angle   Iris Round and dilated Round and dilated  Lens 2+ Nuclear sclerosis, 2+ Cortical cataract 2+ Nuclear sclerosis, 2+ Cortical cataract   Vitreous Vitreous syneresis Vitreous syneresis       Fundus Exam      Right Left   Disc Pink and Sharp, vascular loops temporally Pink and Sharp   C/D Ratio 0.5 0.7   Macula blunted foveal reflex, dot hemorrhages, no edema, +druplets Flat, blunted foveal reflex, Retinal pigment epithelial mottling / +druplets, No heme or edema   Vessels Vascular attenuation, AV crossing changes, Tortuous; refractile plaque at arteriolar bifurcation superior to fovea mid-zone - not present today Vascular attenuation, AV crossing changes, Tortuous   Periphery Attached, bullous retinoschisis cavity inferiorly from 0600 to 0730 - stable from prior, inferior reticular degeneration, No RT/RD on 360 exam Attached, bullous retinoschisis cavity from 0430 to 0600--stable, reticular degeneration, RPE mottling, No RT/RD         Refraction    Wearing Rx      Sphere Cylinder Axis Add   Right +0.75 +0.75 158 +1.75   Left +0.25 +0.50 032 +1.75   Type: PAL       Manifest Refraction (Auto)      Sphere Cylinder Axis Dist VA   Right +0.00 +1.50 164 20/20-1   Left +0.25 +1.25 019 20/-2          IMAGING AND PROCEDURES  Imaging and Procedures for @TODAY @  OCT, Retina - OU - Both Eyes       Right Eye Quality was good. Central Foveal Thickness: 284. Progression has been stable. Findings include normal foveal contour, no IRF, no SRF, retinal drusen , central retinal atrophy (Inferotemporal retinoschisis caught on widefield OCT - stable).   Left Eye Quality was good. Central Foveal Thickness: 264. Progression has been stable. Findings include normal foveal contour, no IRF, no SRF, retinal drusen  (Inferotemporal  retinoschisis caught on widefield OCT).   Notes *Images captured and stored on drive  Diagnosis / Impression:  Inferotemporal retinoschisis OU -- caught on widefield OCT and stable from prior Retinal drusen OU   Clinical management:  See below  Abbreviations: NFP - Normal foveal profile. CME - cystoid macular edema. PED - pigment epithelial detachment. IRF - intraretinal fluid. SRF - subretinal fluid. EZ - ellipsoid zone. ERM - epiretinal membrane. ORA - outer retinal atrophy. ORT - outer retinal tubulation. SRHM - subretinal hyper-reflective material         Color Fundus Photography Optos - OU - Both Eyes       Right Eye Progression has been stable. Disc findings include normal observations. Macula : drusen, hemorrhage. Vessels : tortuous vessels, attenuated. Periphery : (Bullous retinoschisis cavity from 6-730 extending posteriorly to midzone).   Left Eye Progression has been stable. Disc findings include normal observations. Macula : drusen. Vessels : attenuated, tortuous vessels. Periphery : (Bullous retinoschisis cavity from 430-6 extending posteriorly to midzone).   Notes **Images stored on drive**  Impression: Stable, bullous peripheral retinoschisis OU -- no significant change from prior exam/images                ASSESSMENT/PLAN:    ICD-10-CM   1. Retinoschisis of both eyes  H33.103 Color Fundus Photography Optos - OU - Both Eyes  2. Retinal edema  H35.81 OCT, Retina - OU - Both Eyes  3. 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 Retinal artery plaque  I70.8    H35.09   5. Essential hypertension  I10   6. Hypertensive retinopathy of both eyes  H35.033  7. Combined forms of age-related cataract of both eyes  H25.813     1,2. Retinoschisis OU-   - bullous, inferotemporal retinoschisis cavities OU (OD > OS) -- no change from prior  - schisis confirmed by widefield OCT -- stable  - no retinal tears or holes on depressed exam  - fairly large cavity with  significant posterior extension -- has been stable since initial visit 02/2018   - discussed findings, prognosis and possible need for treatment if schisis cavity threatens macula  - will continue to monitor  - F/U in 6-9 months, sooner prn, for repeat DFE/OCT/Optos  3. Retinal drusen OU  - mild OCT findings  - monitor  4. Refractile arteriolar plaque superiorly OD  - located at 1oclock midzone at arteriolar bifurcation -- not present today ?resolved  - good distal perfusion   - monitor  5,6. Hypertensive retinopathy OU  - discussed importance of tight BP control  - monitor  7. Combined form age-related cataract OU-   - The symptoms of cataract, surgical options, and treatments and risks were discussed with patient.  - discussed diagnosis and progression  - not yet visually significant  - monitor for now   Ophthalmic Meds Ordered this visit:  No orders of the defined types were placed in this encounter.      Return for f/u 6-9 months, retonoschisis OU, DFE, OCT, OPTOS colors.  There are no Patient Instructions on file for this visit.   Explained the diagnoses, plan, and follow up with the patient and they expressed understanding.  Patient expressed understanding of the importance of proper follow up care.   This document serves as a record of services personally performed by Gardiner Sleeper, MD, PhD. It was created on their behalf by Ernest Mallick, OA, an ophthalmic assistant. The creation of this record is the provider's dictation and/or activities during the visit.    Electronically signed by: Ernest Mallick, OA  09.16.2020 11:39 PM    Gardiner Sleeper, M.D., Ph.D. Diseases & Surgery of the Retina and Vitreous [facility]  I have reviewed the above documentation for accuracy and completeness, and I agree with the above. Gardiner Sleeper, M.D., Ph.D. 03/02/19 11:39 PM    Abbreviations: M myopia (nearsighted); A astigmatism; H hyperopia  (farsighted); P presbyopia; Mrx spectacle prescription;  CTL contact lenses; OD right eye; OS left eye; OU both eyes  XT exotropia; ET esotropia; PEK punctate epithelial keratitis; PEE punctate epithelial erosions; DES dry eye syndrome; MGD meibomian gland dysfunction; ATs artificial tears; PFAT's preservative free artificial tears; [place] nuclear sclerotic cataract; PSC posterior subcapsular cataract; ERM epi-retinal membrane; PVD posterior vitreous detachment; RD retinal detachment; DM diabetes mellitus; DR diabetic retinopathy; NPDR non-proliferative diabetic retinopathy; PDR proliferative diabetic retinopathy; CSME clinically significant macular edema; DME diabetic macular edema; dbh dot blot hemorrhages; CWS cotton wool spot; POAG primary open angle glaucoma; C/D cup-to-disc ratio; HVF humphrey visual field; GVF goldmann visual field; OCT optical coherence tomography; IOP intraocular pressure; BRVO Branch retinal vein occlusion; CRVO central retinal vein occlusion; CRAO central retinal artery occlusion; BRAO branch retinal artery occlusion; RT retinal tear; SB scleral buckle; PPV pars plana vitrectomy; VH Vitreous hemorrhage; PRP panretinal laser photocoagulation; IVK intravitreal kenalog; VMT vitreomacular traction; MH Macular hole;  NVD neovascularization of the disc; NVE neovascularization elsewhere; AREDS age related eye disease study; ARMD age related macular degeneration; POAG primary open angle glaucoma; EBMD epithelial/anterior basement membrane dystrophy; ACIOL anterior chamber intraocular lens; IOL intraocular lens; PCIOL  posterior chamber intraocular lens; Phaco/IOL phacoemulsification with intraocular lens placement; [place] photorefractive keratectomy; LASIK laser assisted in situ keratomileusis; HTN hypertension; DM diabetes mellitus; COPD chronic obstructive pulmonary disease

## 2019-06-17 DIAGNOSIS — J811 Chronic pulmonary edema: Secondary | ICD-10-CM

## 2019-06-17 HISTORY — DX: Chronic pulmonary edema: J81.1

## 2019-08-02 ENCOUNTER — Emergency Department (HOSPITAL_COMMUNITY): Payer: Medicare HMO

## 2019-08-02 ENCOUNTER — Other Ambulatory Visit: Payer: Self-pay

## 2019-08-02 ENCOUNTER — Encounter (HOSPITAL_COMMUNITY): Payer: Self-pay

## 2019-08-02 ENCOUNTER — Inpatient Hospital Stay (HOSPITAL_COMMUNITY)
Admission: EM | Admit: 2019-08-02 | Discharge: 2019-08-07 | DRG: 064 | Disposition: A | Discharge status: Home Health | Payer: Medicare HMO | Attending: Internal Medicine | Admitting: Internal Medicine

## 2019-08-02 DIAGNOSIS — Z0189 Encounter for other specified special examinations: Secondary | ICD-10-CM

## 2019-08-02 DIAGNOSIS — Z87891 Personal history of nicotine dependence: Secondary | ICD-10-CM | POA: Diagnosis present

## 2019-08-02 DIAGNOSIS — G459 Transient cerebral ischemic attack, unspecified: Secondary | ICD-10-CM | POA: Diagnosis not present

## 2019-08-02 DIAGNOSIS — J9600 Acute respiratory failure, unspecified whether with hypoxia or hypercapnia: Secondary | ICD-10-CM | POA: Diagnosis not present

## 2019-08-02 DIAGNOSIS — E785 Hyperlipidemia, unspecified: Secondary | ICD-10-CM | POA: Diagnosis present

## 2019-08-02 DIAGNOSIS — R29810 Facial weakness: Secondary | ICD-10-CM | POA: Diagnosis present

## 2019-08-02 DIAGNOSIS — Z789 Other specified health status: Secondary | ICD-10-CM | POA: Diagnosis present

## 2019-08-02 DIAGNOSIS — I462 Cardiac arrest due to underlying cardiac condition: Secondary | ICD-10-CM | POA: Diagnosis not present

## 2019-08-02 DIAGNOSIS — Z833 Family history of diabetes mellitus: Secondary | ICD-10-CM

## 2019-08-02 DIAGNOSIS — N1831 Chronic kidney disease, stage 3a: Secondary | ICD-10-CM

## 2019-08-02 DIAGNOSIS — I447 Left bundle-branch block, unspecified: Secondary | ICD-10-CM | POA: Diagnosis not present

## 2019-08-02 DIAGNOSIS — Z91040 Latex allergy status: Secondary | ICD-10-CM

## 2019-08-02 DIAGNOSIS — Z20822 Contact with and (suspected) exposure to covid-19: Secondary | ICD-10-CM | POA: Diagnosis not present

## 2019-08-02 DIAGNOSIS — I639 Cerebral infarction, unspecified: Secondary | ICD-10-CM | POA: Diagnosis not present

## 2019-08-02 DIAGNOSIS — Z96651 Presence of right artificial knee joint: Secondary | ICD-10-CM | POA: Diagnosis present

## 2019-08-02 DIAGNOSIS — F17210 Nicotine dependence, cigarettes, uncomplicated: Secondary | ICD-10-CM | POA: Diagnosis present

## 2019-08-02 DIAGNOSIS — I4901 Ventricular fibrillation: Secondary | ICD-10-CM | POA: Diagnosis not present

## 2019-08-02 DIAGNOSIS — E876 Hypokalemia: Secondary | ICD-10-CM | POA: Diagnosis not present

## 2019-08-02 DIAGNOSIS — R092 Respiratory arrest: Secondary | ICD-10-CM | POA: Diagnosis not present

## 2019-08-02 DIAGNOSIS — I480 Paroxysmal atrial fibrillation: Secondary | ICD-10-CM | POA: Diagnosis present

## 2019-08-02 DIAGNOSIS — Z8673 Personal history of transient ischemic attack (TIA), and cerebral infarction without residual deficits: Secondary | ICD-10-CM

## 2019-08-02 DIAGNOSIS — Z6827 Body mass index (BMI) 27.0-27.9, adult: Secondary | ICD-10-CM

## 2019-08-02 DIAGNOSIS — J9690 Respiratory failure, unspecified, unspecified whether with hypoxia or hypercapnia: Secondary | ICD-10-CM

## 2019-08-02 DIAGNOSIS — I63411 Cerebral infarction due to embolism of right middle cerebral artery: Principal | ICD-10-CM | POA: Diagnosis present

## 2019-08-02 DIAGNOSIS — F419 Anxiety disorder, unspecified: Secondary | ICD-10-CM | POA: Diagnosis present

## 2019-08-02 DIAGNOSIS — N183 Chronic kidney disease, stage 3 unspecified: Secondary | ICD-10-CM | POA: Diagnosis present

## 2019-08-02 DIAGNOSIS — I10 Essential (primary) hypertension: Secondary | ICD-10-CM | POA: Diagnosis not present

## 2019-08-02 DIAGNOSIS — I420 Dilated cardiomyopathy: Secondary | ICD-10-CM | POA: Diagnosis present

## 2019-08-02 DIAGNOSIS — R531 Weakness: Secondary | ICD-10-CM | POA: Diagnosis not present

## 2019-08-02 DIAGNOSIS — Z882 Allergy status to sulfonamides status: Secondary | ICD-10-CM

## 2019-08-02 DIAGNOSIS — R29700 NIHSS score 0: Secondary | ICD-10-CM | POA: Diagnosis present

## 2019-08-02 DIAGNOSIS — I130 Hypertensive heart and chronic kidney disease with heart failure and stage 1 through stage 4 chronic kidney disease, or unspecified chronic kidney disease: Secondary | ICD-10-CM | POA: Diagnosis present

## 2019-08-02 DIAGNOSIS — Q0700 Arnold-Chiari syndrome without spina bifida or hydrocephalus: Secondary | ICD-10-CM

## 2019-08-02 DIAGNOSIS — R1310 Dysphagia, unspecified: Secondary | ICD-10-CM

## 2019-08-02 DIAGNOSIS — I2510 Atherosclerotic heart disease of native coronary artery without angina pectoris: Secondary | ICD-10-CM | POA: Diagnosis present

## 2019-08-02 DIAGNOSIS — Z91013 Allergy to seafood: Secondary | ICD-10-CM

## 2019-08-02 DIAGNOSIS — Z8249 Family history of ischemic heart disease and other diseases of the circulatory system: Secondary | ICD-10-CM

## 2019-08-02 DIAGNOSIS — E663 Overweight: Secondary | ICD-10-CM | POA: Diagnosis present

## 2019-08-02 DIAGNOSIS — G8194 Hemiplegia, unspecified affecting left nondominant side: Secondary | ICD-10-CM | POA: Diagnosis present

## 2019-08-02 DIAGNOSIS — Z888 Allergy status to other drugs, medicaments and biological substances status: Secondary | ICD-10-CM

## 2019-08-02 DIAGNOSIS — R1312 Dysphagia, oropharyngeal phase: Secondary | ICD-10-CM | POA: Diagnosis present

## 2019-08-02 LAB — RAPID URINE DRUG SCREEN, HOSP PERFORMED
Amphetamines: NOT DETECTED
Barbiturates: NOT DETECTED
Benzodiazepines: NOT DETECTED
Cocaine: NOT DETECTED
Opiates: NOT DETECTED
Tetrahydrocannabinol: NOT DETECTED

## 2019-08-02 LAB — DIFFERENTIAL
Abs Immature Granulocytes: 0.01 10*3/uL (ref 0.00–0.07)
Basophils Absolute: 0 10*3/uL (ref 0.0–0.1)
Basophils Relative: 1 %
Eosinophils Absolute: 0.1 10*3/uL (ref 0.0–0.5)
Eosinophils Relative: 2 %
Immature Granulocytes: 0 %
Lymphocytes Relative: 31 %
Lymphs Abs: 2.1 10*3/uL (ref 0.7–4.0)
Monocytes Absolute: 0.5 10*3/uL (ref 0.1–1.0)
Monocytes Relative: 7 %
Neutro Abs: 4.2 10*3/uL (ref 1.7–7.7)
Neutrophils Relative %: 59 %

## 2019-08-02 LAB — CBC
HCT: 45.4 % (ref 36.0–46.0)
HCT: 42.6 % (ref 36.0–46.0)
Hemoglobin: 15 g/dL (ref 12.0–15.0)
Hemoglobin: 14.1 g/dL (ref 12.0–15.0)
MCH: 30.1 pg (ref 26.0–34.0)
MCH: 29.4 pg (ref 26.0–34.0)
MCHC: 33 g/dL (ref 30.0–36.0)
MCHC: 33.1 g/dL (ref 30.0–36.0)
MCV: 91.2 fL (ref 80.0–100.0)
MCV: 88.9 fL (ref 80.0–100.0)
Platelets: 255 10*3/uL (ref 150–400)
Platelets: 242 10*3/uL (ref 150–400)
RBC: 4.98 MIL/uL (ref 3.87–5.11)
RBC: 4.79 MIL/uL (ref 3.87–5.11)
RDW: 13.2 % (ref 11.5–15.5)
RDW: 13.2 % (ref 11.5–15.5)
WBC: 7.5 10*3/uL (ref 4.0–10.5)
WBC: 7 10*3/uL (ref 4.0–10.5)
nRBC: 0 % (ref 0.0–0.2)
nRBC: 0 % (ref 0.0–0.2)

## 2019-08-02 LAB — URINALYSIS, ROUTINE W REFLEX MICROSCOPIC
Bilirubin Urine: NEGATIVE
Glucose, UA: NEGATIVE mg/dL
Hgb urine dipstick: NEGATIVE
Ketones, ur: NEGATIVE mg/dL
Leukocytes,Ua: NEGATIVE
Nitrite: NEGATIVE
Protein, ur: NEGATIVE mg/dL
Specific Gravity, Urine: 1.003 — ABNORMAL LOW (ref 1.005–1.030)
pH: 7 (ref 5.0–8.0)

## 2019-08-02 LAB — PROTIME-INR
INR: 1 (ref 0.8–1.2)
Prothrombin Time: 13.2 seconds (ref 11.4–15.2)

## 2019-08-02 LAB — BASIC METABOLIC PANEL
Anion gap: 10 (ref 5–15)
BUN: 9 mg/dL (ref 8–23)
CO2: 24 mmol/L (ref 22–32)
Calcium: 9.1 mg/dL (ref 8.9–10.3)
Chloride: 107 mmol/L (ref 98–111)
Creatinine, Ser: 1.23 mg/dL — ABNORMAL HIGH (ref 0.44–1.00)
GFR calc Af Amer: 52 mL/min — ABNORMAL LOW (ref >60)
GFR calc non Af Amer: 45 mL/min — ABNORMAL LOW (ref >60)
Glucose, Bld: 90 mg/dL (ref 70–99)
Potassium: 3.3 mmol/L — ABNORMAL LOW (ref 3.5–5.1)
Sodium: 141 mmol/L (ref 135–145)

## 2019-08-02 LAB — I-STAT CHEM 8, ED
BUN: 10 mg/dL (ref 8–23)
Calcium, Ion: 1.07 mmol/L — ABNORMAL LOW (ref 1.15–1.40)
Chloride: 105 mmol/L (ref 98–111)
Creatinine, Ser: 1 mg/dL (ref 0.44–1.00)
Glucose, Bld: 104 mg/dL — ABNORMAL HIGH (ref 70–99)
HCT: 41 % (ref 36.0–46.0)
Hemoglobin: 13.9 g/dL (ref 12.0–15.0)
Potassium: 3.2 mmol/L — ABNORMAL LOW (ref 3.5–5.1)
Sodium: 143 mmol/L (ref 135–145)
TCO2: 27 mmol/L (ref 22–32)

## 2019-08-02 LAB — ETHANOL: Alcohol, Ethyl (B): <10 mg/dL (ref <10)

## 2019-08-02 LAB — APTT: aPTT: 32 seconds (ref 24–36)

## 2019-08-02 MED ORDER — SODIUM CHLORIDE 0.9 % IV BOLUS
500.0000 mL | Freq: Once | INTRAVENOUS | Status: AC
  Administered 2019-08-02: 500 mL via INTRAVENOUS

## 2019-08-02 MED ORDER — SODIUM CHLORIDE 0.9 % IV SOLN: 100.0000 mL/h | INTRAVENOUS | Status: DC

## 2019-08-02 NOTE — ED Provider Notes (Signed)
[facility] Provider Note   CSN: [id number] Arrival date & time: 08/02/19  1320     History No chief complaint on file.   [name] is a 69 y.o. female.  Patient reports that she experienced some numbness in the left side of her face this morning when she awoke.  Patient states that she was drinking a Coke for breakfast and had difficulty swallowing.  Patient reports she is also had recent sinus congestion and drainage.  She is concerned that something could be going on with her sinuses.  Patient complains of overall weakness.  Patient was normal when she went to bed last night around 11:00.  Patient states when she woke up this morning she did not appreciate any numbness in her face initially however she did feel weak overall.  Patient reports she had an episode 10 days ago where she saw flashing lights.  Patient reports all of her symptoms have resolved at this time but she still feels generally weak.  Patient denies any nausea or vomiting she denies any cough or congestion.  Patient denies any difficulty moving her extremities she is able to walk.  The history is provided by the patient. No language interpreter was used.       Past Medical History:  Diagnosis Date  . Allergy   . Anxiety   . Arnold-Chiari malformation ([place])   . Arthritis   . GERD (gastroesophageal reflux disease)   . H. pylori infection    3 years ago  . Hyperlipidemia   . Incontinence   . Syncope and collapse    Per pt, denies passing out  . Tobacco use disorder   . Unspecified essential hypertension     Patient Active Problem List   Diagnosis Date Noted  . CKD (chronic kidney disease) stage 3, GFR 30-59 ml/min 12/13/2018  . Primary localized osteoarthritis of right knee 02/17/2017  . Primary osteoarthritis of right knee 02/17/2017  . Preventative health care 09/01/2016  . Vitamin B12 deficiency 09/10/2015  . Insomnia 07/16/2015  . GERD (gastroesophageal  reflux disease) 03/26/2015  . Fatigue 10/02/2014  . Other malaise and fatigue 02/08/2014  . HLD (hyperlipidemia) 11/19/2011  . Vaginal dryness, menopausal 11/19/2011  . Adjustment disorder 01/08/2011  . TOBACCO ABUSE 12/19/2008  . Essential hypertension 12/19/2008    Past Surgical History:  Procedure Laterality Date  . APPENDECTOMY    . ARNOLD CHIARI SURGERY     neurocranial surgery  . BLEPHAROPLASTY     Bil  . C-EYE SURGERY PROCEDURE    . CHOLECYSTECTOMY    . EYE SURGERY    . MASS EXCISION Right 12/27/2013   Procedure: MINOR EXCISION OF RIGHT THUMB MUCOID CYST, DEBRIDEMENT OF INTERPHALANGEAL JOINT;  Surgeon: Cammie Sickle, MD;  Location: [facility];  Service: Orthopedics;  Laterality: Right;  . mass on thumb  right  . TOTAL KNEE ARTHROPLASTY Right 02/17/2017  . TOTAL KNEE ARTHROPLASTY Right 02/17/2017   Procedure: TOTAL KNEE ARTHROPLASTY;  Surgeon: Melrose Nakayama, MD;  Location: [facility];  Service: Orthopedics;  Laterality: Right;  . TUBAL LIGATION       OB History   No obstetric history on file.     Family History  Problem Relation Age of Onset  . Bone cancer Mother   . Heart disease Mother   . Cancer Sister        metastatic; unknown primary  . Diverticulosis Sister   . Tuberculosis Father   .  Diabetes Sister   . Hypertension Sister   . Colon cancer Neg Hx   . Esophageal cancer Neg Hx   . Pancreatic cancer Neg Hx   . Stomach cancer Neg Hx   . Liver disease Neg Hx   . Kidney disease Neg Hx   . Rectal cancer Neg Hx     Social History   Tobacco Use  . Smoking status: Current Every Day Smoker    Packs/day: 1.00    Years: 50.00    Pack years: 50.00    Types: Cigarettes  . Smokeless tobacco: Never Used  Substance Use Topics  . Alcohol use: No    Alcohol/week: 0.0 standard drinks  . Drug use: No    Home Medications Prior to Admission medications   Medication Sig Start Date End Date Taking? Authorizing Provider  acetaminophen (TYLENOL)  500 MG tablet Take 1,000 mg by mouth 2 (two) times daily as needed (pain).    [provider]  amLODipine-benazepril (LOTREL) 5-20 MG capsule Take 1 capsule by mouth daily. For blood pressure. 12/13/18   [facility] Koch, NP  loratadine (CLARITIN) 10 MG tablet Take 10 mg by mouth daily as needed for allergies.     [provider]    Allergies    Shellfish-derived products, Ativan [lorazepam], Buspar [buspirone], Clarithromycin, Codeine, Doxycycline, Guaifenesin, Oxycodone, Prednisone, Statins, Sulfonamide derivatives, Tetracycline, Vicodin [hydrocodone-acetaminophen], Famotidine, Latex, Omeprazole, Peanut-containing drug products, and Wellbutrin [bupropion]  Review of Systems   Review of Systems  Constitutional: Positive for fatigue.  HENT: Positive for trouble swallowing.   Eyes: Negative for visual disturbance.  Respiratory: Negative for chest tightness.   Musculoskeletal: Negative for gait problem.  Neurological: Positive for light-headedness. Negative for dizziness.  All other systems reviewed and are negative.   Physical Exam Updated Vital Signs BP (!) 149/84   Pulse 97   Temp 98.6 F (37 C) (Oral)   Resp 13   SpO2 98%   Physical Exam Vitals and nursing note reviewed.  Constitutional:      Appearance: Normal appearance. She is well-developed.  HENT:     Head: Normocephalic.     Right Ear: External ear normal.     Left Ear: External ear normal.     Mouth/Throat:     Mouth: Mucous membranes are moist.  Eyes:     Pupils: Pupils are equal, round, and reactive to light.  Cardiovascular:     Rate and Rhythm: Normal rate and regular rhythm.     Pulses: Normal pulses.  Pulmonary:     Effort: Pulmonary effort is normal.  Abdominal:     General: Abdomen is flat. There is no distension.  Musculoskeletal:        General: Normal range of motion.     Cervical back: Normal range of motion.  Neurological:     General: No focal deficit present.     Mental  Status: She is alert and oriented to person, place, and time.     Sensory: No sensory deficit.     Coordination: Coordination normal.     Deep Tendon Reflexes: Reflexes normal.  Psychiatric:        Mood and Affect: Mood normal.     ED Results / Procedures / Treatments   Labs (all labs ordered are listed, but only abnormal results are displayed) Labs Reviewed  BASIC METABOLIC PANEL - Abnormal; Notable for the following components:      Result Value   Potassium 3.3 (*)    Creatinine, Ser 1.23 (*)  GFR calc non Af Amer 45 (*)    GFR calc Af Amer 52 (*)    All other components within normal limits  CBC    EKG EKG Interpretation  Date/Time:  Tuesday August 02 2019 13:20:18 EST Ventricular Rate:  69 PR Interval:  184 QRS Duration: 158 QT Interval:  494 QTC Calculation: 529 R Axis:   -71 Text Interpretation: Normal sinus rhythm Left axis deviation Left bundle branch block Artifact Abnormal ECG Confirmed by Carmin Muskrat [id number]) on 08/02/2019 4:52:38 PM   Radiology CT Head Wo Contrast  Result Date: 08/02/2019 CLINICAL DATA:  Transient ischemic attack (TIA). Maxillofacial pain. Additional history provided: Patient presents for left-sided facial pressure and dizziness, pressure feeling radiates to left side of face and to ear. EXAM: CT HEAD WITHOUT CONTRAST CT MAXILLOFACIAL WITHOUT CONTRAST TECHNIQUE: Contiguous axial images were obtained from the base of the skull through the vertex without intravenous contrast. Multidetector CT imaging of the maxillofacial structures was performed. Multiplanar CT image reconstructions were also generated. A small metallic BB was placed on the right temple in order to reliably differentiate right from left. COMPARISON:  No pertinent prior studies available for comparison. FINDINGS: CT HEAD FINDINGS Brain: Streak artifact limits evaluation of the inferomedial cerebellum. There is no evidence of acute intracranial hemorrhage. No demarcated cortical  infarction. No evidence of intracranial mass. No midline shift or extra-axial fluid collection. There is a small region of ill-defined hypodensity the right frontal lobe white matter (series 4, images 20-24). Age-indeterminate lacunar infarct within the right lentiform nucleus/external capsule (series 4, images 16 and 17). Small chronic lacunar infarct within the right caudate. 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 Mild generalized parenchymal atrophy. Vascular: No hyperdense vessel.  Atherosclerotic calcifications Skull: Sequela of prior posterior fossa decompression, including a postsurgical defect within the C1 posterior arch. No calvarial fracture or aggressive osseous lesion. CT MAXILLOFACIAL FINDINGS Osseous: No maxillofacial fracture. Small bony exostosis along the superficial aspect of the posterior right mandibular body (series 5, image 29). Orbits: No abnormality identified. Sinuses: No significant paranasal sinus disease. Soft tissues: Carotid artery calcified plaque. The visualized maxillofacial and upper neck soft tissues are otherwise unremarkable. Streak artifact from dental restoration significant limits evaluation of the oral cavity and also somewhat limits evaluation of the oropharynx. Within this limitation, there is no appreciable mass or swelling within the oral cavity, pharynx or larynx. Other: The temporomandibular joints are unremarkable. Left mastoid effusion. IMPRESSION: CT head: 1. Age-indeterminate lacunar infarct within the right lentiform nucleus/external capsule. 2. Small focus of ill-defined hypoattenuation within the right frontal lobe subcortical white matter which may reflect chronic ischemic change. A recent white matter infarct at this site cannot be excluded. 3. Chronic lacunar infarct within the right caudate. 4. 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 5. Mild generalized parenchymal atrophy. CT maxillofacial: 1. Streak artifact  from dental restoration limits evaluation of the oral cavity and oropharynx. Within this limitation, no swelling or discrete mass is appreciated within the oral cavity or pharynx. 2. Small left mastoid effusion. Electronically Signed   By: Kellie Simmering DO   On: 08/02/2019 18:50   CT Maxillofacial Wo Contrast  Result Date: 08/02/2019 CLINICAL DATA:  Transient ischemic attack (TIA). Maxillofacial pain. Additional history provided: Patient presents for left-sided facial pressure and dizziness, pressure feeling radiates to left side of face and to ear. EXAM: CT HEAD WITHOUT CONTRAST CT MAXILLOFACIAL WITHOUT CONTRAST TECHNIQUE: Contiguous axial images were obtained from the base of the skull through  the vertex without intravenous contrast. Multidetector CT imaging of the maxillofacial structures was performed. Multiplanar CT image reconstructions were also generated. A small metallic BB was placed on the right temple in order to reliably differentiate right from left. COMPARISON:  No pertinent prior studies available for comparison. FINDINGS: CT HEAD FINDINGS Brain: Streak artifact limits evaluation of the inferomedial cerebellum. There is no evidence of acute intracranial hemorrhage. No demarcated cortical infarction. No evidence of intracranial mass. No midline shift or extra-axial fluid collection. There is a small region of ill-defined hypodensity the right frontal lobe white matter (series 4, images 20-24). Age-indeterminate lacunar infarct within the right lentiform nucleus/external capsule (series 4, images 16 and 17). Small chronic lacunar infarct within the right caudate. 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 Mild generalized parenchymal atrophy. Vascular: No hyperdense vessel.  Atherosclerotic calcifications Skull: Sequela of prior posterior fossa decompression, including a postsurgical defect within the C1 posterior arch. No calvarial fracture or aggressive osseous lesion. CT  MAXILLOFACIAL FINDINGS Osseous: No maxillofacial fracture. Small bony exostosis along the superficial aspect of the posterior right mandibular body (series 5, image 29). Orbits: No abnormality identified. Sinuses: No significant paranasal sinus disease. Soft tissues: Carotid artery calcified plaque. The visualized maxillofacial and upper neck soft tissues are otherwise unremarkable. Streak artifact from dental restoration significant limits evaluation of the oral cavity and also somewhat limits evaluation of the oropharynx. Within this limitation, there is no appreciable mass or swelling within the oral cavity, pharynx or larynx. Other: The temporomandibular joints are unremarkable. Left mastoid effusion. IMPRESSION: CT head: 1. Age-indeterminate lacunar infarct within the right lentiform nucleus/external capsule. 2. Small focus of ill-defined hypoattenuation within the right frontal lobe subcortical white matter which may reflect chronic ischemic change. A recent white matter infarct at this site cannot be excluded. 3. Chronic lacunar infarct within the right caudate. 4. 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 5. Mild generalized parenchymal atrophy. CT maxillofacial: 1. Streak artifact from dental restoration limits evaluation of the oral cavity and oropharynx. Within this limitation, no swelling or discrete mass is appreciated within the oral cavity or pharynx. 2. Small left mastoid effusion. Electronically Signed   By: Kellie Simmering DO   On: 08/02/2019 18:50    Procedures Procedures (including critical care time)  Medications Ordered in ED Medications - No data to display  ED Course  I have reviewed the triage vital signs and the nursing notes.  Pertinent labs & imaging results that were available during my care of the patient were reviewed by me and considered in my medical decision making (see chart for details).    MDM Rules/Calculators/A&P                     MDM Ct  scan shows cva of indeterminate age.  I spoke to Dr. Cheral Marker who advised obtain MRI to evaluate.  Pt's care turned over to Dr. Vanita Panda with MRi pending  Final Clinical Impression(s) / ED Diagnoses Final diagnoses:  Cerebrovascular accident (CVA), unspecified mechanism [facility])    Rx / [place] Orders ED Discharge Orders    Jackson       Sidney Ace 08/02/19 2051    Carmin Muskrat, MD 08/02/19 2340

## 2019-08-02 NOTE — ED Notes (Signed)
Pt transported to MRI 

## 2019-08-02 NOTE — ED Triage Notes (Signed)
Pt from home with ems for left sided facial pressure and dizziness. At 9am pt states she tried drinking some Coke and she felt like her throat was already full and it was hard for her to swallow. Denies headache or vision changes but having a pressure feeling on the left side of her face that radiates to her ear. Stroke screen negative for EMS. Pt arrives alert, oriented, no neuro deficits noted at this time.

## 2019-08-02 NOTE — Consult Note (Signed)
NEURO HOSPITALIST CONSULT NOTE   Requestig physician: Dr. Vanita Panda  Reason for Consult: Left sided weakness  History obtained from:   Patient and Chart     HPI:                                                                                                                                          [name] is an 69 y.o. female with a PMHx of Chiari I malformation s/p decompression, HLD and HTN who presented via EMS with new onset of trouble swallowing, left sided weakness as well as dizziness and a sensation of left facial pressure. Symptoms were first noticed in the morning on Tuesday when the patient had trouble swallowing while drinking Coke.   CT head in the ED showed age indeterminate hypodensities. 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  Past Medical History:  Diagnosis Date  . Allergy   . Anxiety   . Arnold-Chiari malformation ([place])   . Arthritis   . GERD (gastroesophageal reflux disease)   . H. pylori infection    3 years ago  . Hyperlipidemia   . Incontinence   . Syncope and collapse    Per pt, denies passing out  . Tobacco use disorder   . Unspecified essential hypertension     Past Surgical History:  Procedure Laterality Date  . APPENDECTOMY    . ARNOLD CHIARI SURGERY     neurocranial surgery  . BLEPHAROPLASTY     Bil  . C-EYE SURGERY PROCEDURE    . CHOLECYSTECTOMY    . EYE SURGERY    . MASS EXCISION Right 12/27/2013   Procedure: MINOR EXCISION OF RIGHT THUMB MUCOID CYST, DEBRIDEMENT OF INTERPHALANGEAL JOINT;  Surgeon: Cammie Sickle, MD;  Location: [facility];  Service: Orthopedics;  Laterality: Right;  . mass on thumb  right  . TOTAL KNEE ARTHROPLASTY Right 02/17/2017  . TOTAL KNEE ARTHROPLASTY Right 02/17/2017   Procedure: TOTAL KNEE ARTHROPLASTY;  Surgeon: Melrose Nakayama, MD;  Location: [facility];  Service: Orthopedics;  Laterality: Right;  . TUBAL LIGATION       Family History  Problem Relation Age of Onset  . Bone cancer Mother   . Heart disease Mother   . Cancer Sister        metastatic; unknown primary  . Diverticulosis Sister   . Tuberculosis Father   . Diabetes Sister   . Hypertension Sister   . Colon cancer Neg Hx   . Esophageal cancer Neg Hx   . Pancreatic cancer Neg Hx   . Stomach cancer Neg Hx   . Liver disease Neg Hx   . Kidney disease Neg Hx   . Rectal cancer Neg Hx  Social History:  reports that she has been smoking cigarettes. She has a 50.00 pack-year smoking history. She has never used smokeless tobacco. She reports that she does not drink alcohol or use drugs.  Allergies  Allergen Reactions  . Shellfish-Derived Products Anaphylaxis  . Ativan [Lorazepam] Other (See Comments)    Makes "skin crawl" and insomnia  . Buspar [Buspirone] Nausea Only    Sweating and dizzy  . Clarithromycin Swelling  . Codeine Nausea And Vomiting  . Doxycycline Other (See Comments)    Unknown  . Guaifenesin Other (See Comments)    Palpitations  . Oxycodone Nausea And Vomiting  . Prednisone Nausea And Vomiting and Other (See Comments)    Makes my heart race  . Statins Other (See Comments)    Muscle pain  . Sulfonamide Derivatives Nausea And Vomiting    Achiness  . Tetracycline Nausea Only  . Vicodin [Hydrocodone-Acetaminophen] Nausea And Vomiting  . Famotidine Rash  . Latex Rash  . Omeprazole Nausea Only    Cough, shortness of breath - patient doesn't remember  . Peanut-Containing Drug Products Itching and Rash  . Wellbutrin [Bupropion] Palpitations    HOME MEDICATIONS:                                                                                                                      No current facility-administered medications on file prior to encounter.   Current Outpatient Medications on File Prior to Encounter  Medication Sig Dispense Refill  . acetaminophen (TYLENOL) 500 MG tablet Take 1,000 mg by mouth 2  (two) times daily as needed (pain).    [name] amLODipine-benazepril (LOTREL) 5-20 MG capsule Take 1 capsule by mouth daily. For blood pressure. 90 capsule 3  . docusate sodium (COLACE) 100 MG capsule Take 100 mg by mouth daily as needed for mild constipation.    [name] loratadine (CLARITIN) 10 MG tablet Take 10 mg by mouth daily as needed for allergies.        ROS:                                                                                                                                       As per HPI. Comprehensive ROS otherwise negative.    Blood pressure (!) 141/68, pulse 63, temperature 98.6 F (37 C), temperature source Oral, resp. rate 19, SpO2 96 %.   General Examination:  Physical Exam  HEENT-  [place]/AT Lungs: Respirations unlabored Extremities- No edema   Neurological Examination Mental Status: Alert, oriented, thought content appropriate.  Speech fluent without evidence of aphasia.  Able to follow all commands without difficulty. Cranial Nerves: II:  Visual fields grossly normal with no extinction to DSS III,IV, VI: No ptosis. EOMI V,VII: Mild left facial droop. Temp sensation equal bilaterally  VIII: hearing intact to voice IX,X: No hypophonia XI: Symmetric XII: midline tongue extension Motor: RUE and RLE 5/5 LUE with slightly weaker grip than on the right and slightly weaker biceps and triceps - 4+/5 LLE 5/5 Subtle parietal drift of LUE Positive orbiting fingers test on the left.  Sensory: Temp and light touch intact throughout, bilaterally. No extinction noted.  Deep Tendon Reflexes: No asymmetry Cerebellar: No ataxia with FNF bilaterally  Gait: Deferred   Lab Results: Basic Metabolic Panel: Recent Labs  Lab 08/02/19 1328 08/02/19 2225  NA 141 143  K 3.3* 3.2*  CL 107 105  CO2 24  --   GLUCOSE 90 104*  BUN 9 10  CREATININE 1.23* 1.00  CALCIUM 9.1  --      CBC: Recent Labs  Lab 08/02/19 1328 08/02/19 2030 08/02/19 2225  WBC 7.5 7.0  --   NEUTROABS  --  4.2  --   HGB 15.0 14.1 13.9  HCT 45.4 42.6 41.0  MCV 91.2 88.9  --   PLT 255 242  --     Cardiac Enzymes: No results for input(s): CKTOTAL, CKMB, CKMBINDEX, TROPONINI in the last 168 hours.  Lipid Panel: No results for input(s): CHOL, TRIG, HDL, CHOLHDL, VLDL, LDLCALC in the last 168 hours.  Imaging: CT Head Wo Contrast  Result Date: 08/02/2019 CLINICAL DATA:  Transient ischemic attack (TIA). Maxillofacial pain. Additional history provided: Patient presents for left-sided facial pressure and dizziness, pressure feeling radiates to left side of face and to ear. EXAM: CT HEAD WITHOUT CONTRAST CT MAXILLOFACIAL WITHOUT CONTRAST TECHNIQUE: Contiguous axial images were obtained from the base of the skull through the vertex without intravenous contrast. Multidetector CT imaging of the maxillofacial structures was performed. Multiplanar CT image reconstructions were also generated. A small metallic BB was placed on the right temple in order to reliably differentiate right from left. COMPARISON:  No pertinent prior studies available for comparison. FINDINGS: CT HEAD FINDINGS Brain: Streak artifact limits evaluation of the inferomedial cerebellum. There is no evidence of acute intracranial hemorrhage. No demarcated cortical infarction. No evidence of intracranial mass. No midline shift or extra-axial fluid collection. There is a small region of ill-defined hypodensity the right frontal lobe white matter (series 4, images 20-24). Age-indeterminate lacunar infarct within the right lentiform nucleus/external capsule (series 4, images 16 and 17). Small chronic lacunar infarct within the right caudate. 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 Mild generalized parenchymal atrophy. Vascular: No hyperdense vessel.  Atherosclerotic calcifications Skull: Sequela of prior posterior fossa  decompression, including a postsurgical defect within the C1 posterior arch. No calvarial fracture or aggressive osseous lesion. CT MAXILLOFACIAL FINDINGS Osseous: No maxillofacial fracture. Small bony exostosis along the superficial aspect of the posterior right mandibular body (series 5, image 29). Orbits: No abnormality identified. Sinuses: No significant paranasal sinus disease. Soft tissues: Carotid artery calcified plaque. The visualized maxillofacial and upper neck soft tissues are otherwise unremarkable. Streak artifact from dental restoration significant limits evaluation of the oral cavity and also somewhat limits evaluation of the oropharynx. Within this limitation, there is no appreciable mass or swelling within the  oral cavity, pharynx or larynx. Other: The temporomandibular joints are unremarkable. Left mastoid effusion. IMPRESSION: CT head: 1. Age-indeterminate lacunar infarct within the right lentiform nucleus/external capsule. 2. Small focus of ill-defined hypoattenuation within the right frontal lobe subcortical white matter which may reflect chronic ischemic change. A recent white matter infarct at this site cannot be excluded. 3. Chronic lacunar infarct within the right caudate. 4. 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 5. Mild generalized parenchymal atrophy. CT maxillofacial: 1. Streak artifact from dental restoration limits evaluation of the oral cavity and oropharynx. Within this limitation, no swelling or discrete mass is appreciated within the oral cavity or pharynx. 2. Small left mastoid effusion. Electronically Signed   By: Kellie Simmering DO   On: 08/02/2019 18:50   MR ANGIO HEAD WO CONTRAST  Result Date: 08/02/2019 CLINICAL DATA:  Left-sided facial numbness EXAM: MRI HEAD WITHOUT CONTRAST MRA HEAD WITHOUT CONTRAST TECHNIQUE: Multiplanar, multiecho pulse sequences of the brain and surrounding structures were obtained without intravenous contrast. Angiographic  images of the head were obtained using MRA technique without contrast. COMPARISON:  08/02/2019 FINDINGS: MRI HEAD FINDINGS Brain: There is an area of abnormal diffusion restriction within the right basal ganglia. Multifocal white matter hyperintensity, most commonly due to chronic ischemic microangiopathy. Normal volume of CSF spaces. No chronic microhemorrhage. Normal midline structures. Vascular: Normal flow voids. Skull and upper cervical spine: Normal marrow signal. Sinuses/Orbits: Small amount of left mastoid fluid. Sinuses are clear. Normal orbits. Other: None MRA HEAD FINDINGS POSTERIOR CIRCULATION: --Vertebral arteries: Normal V4 segments. --Posterior inferior cerebellar arteries (PICA): Patent origins from the vertebral arteries. --Anterior inferior cerebellar arteries (AICA): Patent origins from the basilar artery. --Basilar artery: Normal. --Superior cerebellar arteries: Normal. --Posterior cerebral arteries: Normal. Both originate from the basilar artery. Posterior communicating arteries (p-comm) are diminutive or absent. ANTERIOR CIRCULATION: --Intracranial internal carotid arteries: Normal. --Anterior cerebral arteries (ACA): Normal. Both A1 segments are present. Patent anterior communicating artery (a-comm). --Middle cerebral arteries (MCA): Normal. IMPRESSION: 1. 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 No hemorrhage or mass effect. 2. Normal intracranial MRA. Electronically Signed   By: Ulyses Jarred M.D.   On: 08/02/2019 22:02   MR BRAIN WO CONTRAST  Result Date: 08/02/2019 CLINICAL DATA:  Left-sided facial numbness EXAM: MRI HEAD WITHOUT CONTRAST MRA HEAD WITHOUT CONTRAST TECHNIQUE: Multiplanar, multiecho pulse sequences of the brain and surrounding structures were obtained without intravenous contrast. Angiographic images of the head were obtained using MRA technique without contrast. COMPARISON:  08/02/2019 FINDINGS: MRI HEAD FINDINGS Brain: There is an area of abnormal diffusion  restriction within the right basal ganglia. Multifocal white matter hyperintensity, most commonly due to chronic ischemic microangiopathy. Normal volume of CSF spaces. No chronic microhemorrhage. Normal midline structures. Vascular: Normal flow voids. Skull and upper cervical spine: Normal marrow signal. Sinuses/Orbits: Small amount of left mastoid fluid. Sinuses are clear. Normal orbits. Other: None MRA HEAD FINDINGS POSTERIOR CIRCULATION: --Vertebral arteries: Normal V4 segments. --Posterior inferior cerebellar arteries (PICA): Patent origins from the vertebral arteries. --Anterior inferior cerebellar arteries (AICA): Patent origins from the basilar artery. --Basilar artery: Normal. --Superior cerebellar arteries: Normal. --Posterior cerebral arteries: Normal. Both originate from the basilar artery. Posterior communicating arteries (p-comm) are diminutive or absent. ANTERIOR CIRCULATION: --Intracranial internal carotid arteries: Normal. --Anterior cerebral arteries (ACA): Normal. Both A1 segments are present. Patent anterior communicating artery (a-comm). --Middle cerebral arteries (MCA): Normal. IMPRESSION: 1. 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 No hemorrhage or mass effect. 2. Normal intracranial MRA. Electronically Signed  By: Ulyses Jarred M.D.   On: 08/02/2019 22:02   CT Maxillofacial Wo Contrast  Result Date: 08/02/2019 CLINICAL DATA:  Transient ischemic attack (TIA). Maxillofacial pain. Additional history provided: Patient presents for left-sided facial pressure and dizziness, pressure feeling radiates to left side of face and to ear. EXAM: CT HEAD WITHOUT CONTRAST CT MAXILLOFACIAL WITHOUT CONTRAST TECHNIQUE: Contiguous axial images were obtained from the base of the skull through the vertex without intravenous contrast. Multidetector CT imaging of the maxillofacial structures was performed. Multiplanar CT image reconstructions were also generated. A small metallic BB was placed on the  right temple in order to reliably differentiate right from left. COMPARISON:  No pertinent prior studies available for comparison. FINDINGS: CT HEAD FINDINGS Brain: Streak artifact limits evaluation of the inferomedial cerebellum. There is no evidence of acute intracranial hemorrhage. No demarcated cortical infarction. No evidence of intracranial mass. No midline shift or extra-axial fluid collection. There is a small region of ill-defined hypodensity the right frontal lobe white matter (series 4, images 20-24). Age-indeterminate lacunar infarct within the right lentiform nucleus/external capsule (series 4, images 16 and 17). Small chronic lacunar infarct within the right caudate. 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 Mild generalized parenchymal atrophy. Vascular: No hyperdense vessel.  Atherosclerotic calcifications Skull: Sequela of prior posterior fossa decompression, including a postsurgical defect within the C1 posterior arch. No calvarial fracture or aggressive osseous lesion. CT MAXILLOFACIAL FINDINGS Osseous: No maxillofacial fracture. Small bony exostosis along the superficial aspect of the posterior right mandibular body (series 5, image 29). Orbits: No abnormality identified. Sinuses: No significant paranasal sinus disease. Soft tissues: Carotid artery calcified plaque. The visualized maxillofacial and upper neck soft tissues are otherwise unremarkable. Streak artifact from dental restoration significant limits evaluation of the oral cavity and also somewhat limits evaluation of the oropharynx. Within this limitation, there is no appreciable mass or swelling within the oral cavity, pharynx or larynx. Other: The temporomandibular joints are unremarkable. Left mastoid effusion. IMPRESSION: CT head: 1. Age-indeterminate lacunar infarct within the right lentiform nucleus/external capsule. 2. Small focus of ill-defined hypoattenuation within the right frontal lobe subcortical white  matter which may reflect chronic ischemic change. A recent white matter infarct at this site cannot be excluded. 3. Chronic lacunar infarct within the right caudate. 4. 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 5. Mild generalized parenchymal atrophy. CT maxillofacial: 1. Streak artifact from dental restoration limits evaluation of the oral cavity and oropharynx. Within this limitation, no swelling or discrete mass is appreciated within the oral cavity or pharynx. 2. Small left mastoid effusion. Electronically Signed   By: Kellie Simmering DO   On: 08/02/2019 18:50    Assessment: 69 year old female with acute right putamen ischemic infarction.  1. MRI reveals an acute ischemic infarct of the right basal ganglia. No hemorrhage or mass effect. Chronic small vessel ischemic changes are also noted.  2. MRA head: Normal 3. Exam reveals subtle left sided motor findings.  4. Stroke risk factors: HTN, HLD and smoking  Recommendations: 1. Full stroke work up 2. PT/OT/speech 3. Start ASA 4. Has a statin allergy 5. BP management 6. Patient has been counseled to quit smoking.    Electronically signed: Dr. Kerney Elbe 08/02/2019, 10:27 PM

## 2019-08-02 NOTE — ED Notes (Signed)
Pt transported to CT ?

## 2019-08-03 ENCOUNTER — Observation Stay (HOSPITAL_COMMUNITY): Payer: Medicare HMO

## 2019-08-03 DIAGNOSIS — I10 Essential (primary) hypertension: Secondary | ICD-10-CM

## 2019-08-03 DIAGNOSIS — R69 Illness, unspecified: Secondary | ICD-10-CM | POA: Diagnosis not present

## 2019-08-03 DIAGNOSIS — I469 Cardiac arrest, cause unspecified: Secondary | ICD-10-CM | POA: Diagnosis not present

## 2019-08-03 DIAGNOSIS — I6389 Other cerebral infarction: Secondary | ICD-10-CM | POA: Diagnosis not present

## 2019-08-03 DIAGNOSIS — E663 Overweight: Secondary | ICD-10-CM | POA: Diagnosis present

## 2019-08-03 DIAGNOSIS — I462 Cardiac arrest due to underlying cardiac condition: Secondary | ICD-10-CM | POA: Diagnosis not present

## 2019-08-03 DIAGNOSIS — I340 Nonrheumatic mitral (valve) insufficiency: Secondary | ICD-10-CM

## 2019-08-03 DIAGNOSIS — Z789 Other specified health status: Secondary | ICD-10-CM | POA: Diagnosis present

## 2019-08-03 DIAGNOSIS — F17200 Nicotine dependence, unspecified, uncomplicated: Secondary | ICD-10-CM

## 2019-08-03 DIAGNOSIS — I420 Dilated cardiomyopathy: Secondary | ICD-10-CM | POA: Diagnosis not present

## 2019-08-03 DIAGNOSIS — R29818 Other symptoms and signs involving the nervous system: Secondary | ICD-10-CM | POA: Diagnosis not present

## 2019-08-03 DIAGNOSIS — R918 Other nonspecific abnormal finding of lung field: Secondary | ICD-10-CM | POA: Diagnosis not present

## 2019-08-03 DIAGNOSIS — I5021 Acute systolic (congestive) heart failure: Secondary | ICD-10-CM | POA: Diagnosis not present

## 2019-08-03 DIAGNOSIS — Z91013 Allergy to seafood: Secondary | ICD-10-CM | POA: Diagnosis not present

## 2019-08-03 DIAGNOSIS — Z20822 Contact with and (suspected) exposure to covid-19: Secondary | ICD-10-CM | POA: Diagnosis not present

## 2019-08-03 DIAGNOSIS — I2510 Atherosclerotic heart disease of native coronary artery without angina pectoris: Secondary | ICD-10-CM | POA: Diagnosis present

## 2019-08-03 DIAGNOSIS — Z882 Allergy status to sulfonamides status: Secondary | ICD-10-CM | POA: Diagnosis not present

## 2019-08-03 DIAGNOSIS — I480 Paroxysmal atrial fibrillation: Secondary | ICD-10-CM | POA: Diagnosis not present

## 2019-08-03 DIAGNOSIS — R1312 Dysphagia, oropharyngeal phase: Secondary | ICD-10-CM | POA: Diagnosis not present

## 2019-08-03 DIAGNOSIS — Q0700 Arnold-Chiari syndrome without spina bifida or hydrocephalus: Secondary | ICD-10-CM | POA: Diagnosis not present

## 2019-08-03 DIAGNOSIS — N1831 Chronic kidney disease, stage 3a: Secondary | ICD-10-CM | POA: Diagnosis not present

## 2019-08-03 DIAGNOSIS — R29700 NIHSS score 0: Secondary | ICD-10-CM | POA: Diagnosis not present

## 2019-08-03 DIAGNOSIS — I5020 Unspecified systolic (congestive) heart failure: Secondary | ICD-10-CM | POA: Diagnosis not present

## 2019-08-03 DIAGNOSIS — R092 Respiratory arrest: Secondary | ICD-10-CM | POA: Diagnosis not present

## 2019-08-03 DIAGNOSIS — I130 Hypertensive heart and chronic kidney disease with heart failure and stage 1 through stage 4 chronic kidney disease, or unspecified chronic kidney disease: Secondary | ICD-10-CM | POA: Diagnosis not present

## 2019-08-03 DIAGNOSIS — I639 Cerebral infarction, unspecified: Secondary | ICD-10-CM

## 2019-08-03 DIAGNOSIS — I429 Cardiomyopathy, unspecified: Secondary | ICD-10-CM | POA: Diagnosis not present

## 2019-08-03 DIAGNOSIS — I447 Left bundle-branch block, unspecified: Secondary | ICD-10-CM | POA: Diagnosis not present

## 2019-08-03 DIAGNOSIS — E785 Hyperlipidemia, unspecified: Secondary | ICD-10-CM | POA: Diagnosis present

## 2019-08-03 DIAGNOSIS — Z6827 Body mass index (BMI) 27.0-27.9, adult: Secondary | ICD-10-CM | POA: Diagnosis not present

## 2019-08-03 DIAGNOSIS — I4901 Ventricular fibrillation: Secondary | ICD-10-CM | POA: Diagnosis not present

## 2019-08-03 DIAGNOSIS — I63411 Cerebral infarction due to embolism of right middle cerebral artery: Secondary | ICD-10-CM | POA: Diagnosis not present

## 2019-08-03 DIAGNOSIS — Z4682 Encounter for fitting and adjustment of non-vascular catheter: Secondary | ICD-10-CM | POA: Diagnosis not present

## 2019-08-03 DIAGNOSIS — E876 Hypokalemia: Secondary | ICD-10-CM | POA: Diagnosis not present

## 2019-08-03 DIAGNOSIS — I472 Ventricular tachycardia: Secondary | ICD-10-CM | POA: Diagnosis not present

## 2019-08-03 DIAGNOSIS — F17210 Nicotine dependence, cigarettes, uncomplicated: Secondary | ICD-10-CM | POA: Diagnosis present

## 2019-08-03 DIAGNOSIS — R1310 Dysphagia, unspecified: Secondary | ICD-10-CM | POA: Diagnosis not present

## 2019-08-03 DIAGNOSIS — J9690 Respiratory failure, unspecified, unspecified whether with hypoxia or hypercapnia: Secondary | ICD-10-CM | POA: Diagnosis not present

## 2019-08-03 DIAGNOSIS — E7800 Pure hypercholesterolemia, unspecified: Secondary | ICD-10-CM | POA: Diagnosis not present

## 2019-08-03 DIAGNOSIS — G8194 Hemiplegia, unspecified affecting left nondominant side: Secondary | ICD-10-CM | POA: Diagnosis not present

## 2019-08-03 DIAGNOSIS — Z91040 Latex allergy status: Secondary | ICD-10-CM | POA: Diagnosis not present

## 2019-08-03 DIAGNOSIS — R29810 Facial weakness: Secondary | ICD-10-CM | POA: Diagnosis present

## 2019-08-03 LAB — CBC WITH DIFFERENTIAL/PLATELET
Abs Immature Granulocytes: 0.01 10*3/uL (ref 0.00–0.07)
Basophils Absolute: 0 10*3/uL (ref 0.0–0.1)
Basophils Relative: 1 %
Eosinophils Absolute: 0.1 10*3/uL (ref 0.0–0.5)
Eosinophils Relative: 1 %
HCT: 43.3 % (ref 36.0–46.0)
Hemoglobin: 14.3 g/dL (ref 12.0–15.0)
Immature Granulocytes: 0 %
Lymphocytes Relative: 28 %
Lymphs Abs: 1.8 10*3/uL (ref 0.7–4.0)
MCH: 30.1 pg (ref 26.0–34.0)
MCHC: 33 g/dL (ref 30.0–36.0)
MCV: 91.2 fL (ref 80.0–100.0)
Monocytes Absolute: 0.4 10*3/uL (ref 0.1–1.0)
Monocytes Relative: 6 %
Neutro Abs: 4.3 10*3/uL (ref 1.7–7.7)
Neutrophils Relative %: 64 %
Platelets: 251 10*3/uL (ref 150–400)
RBC: 4.75 MIL/uL (ref 3.87–5.11)
RDW: 13.2 % (ref 11.5–15.5)
WBC: 6.6 10*3/uL (ref 4.0–10.5)
nRBC: 0 % (ref 0.0–0.2)

## 2019-08-03 LAB — HIV ANTIBODY (ROUTINE TESTING W REFLEX): HIV Screen 4th Generation wRfx: NONREACTIVE

## 2019-08-03 LAB — LIPID PANEL
Cholesterol: 232 mg/dL — ABNORMAL HIGH (ref 0–200)
HDL: 31 mg/dL — ABNORMAL LOW (ref >40)
LDL Cholesterol: 175 mg/dL — ABNORMAL HIGH (ref 0–99)
Total CHOL/HDL Ratio: 7.5 RATIO
Triglycerides: 128 mg/dL (ref <150)
VLDL: 26 mg/dL (ref 0–40)

## 2019-08-03 LAB — BASIC METABOLIC PANEL
Anion gap: 11 (ref 5–15)
BUN: 9 mg/dL (ref 8–23)
CO2: 25 mmol/L (ref 22–32)
Calcium: 8.8 mg/dL — ABNORMAL LOW (ref 8.9–10.3)
Chloride: 107 mmol/L (ref 98–111)
Creatinine, Ser: 1.04 mg/dL — ABNORMAL HIGH (ref 0.44–1.00)
GFR calc Af Amer: >60 mL/min (ref >60)
GFR calc non Af Amer: 55 mL/min — ABNORMAL LOW (ref >60)
Glucose, Bld: 103 mg/dL — ABNORMAL HIGH (ref 70–99)
Potassium: 3.9 mmol/L (ref 3.5–5.1)
Sodium: 143 mmol/L (ref 135–145)

## 2019-08-03 LAB — HEMOGLOBIN A1C
Hgb A1c MFr Bld: 5.7 % — ABNORMAL HIGH (ref 4.8–5.6)
Mean Plasma Glucose: 116.89 mg/dL

## 2019-08-03 LAB — SARS CORONAVIRUS 2 (TAT 6-24 HRS): SARS Coronavirus 2: NEGATIVE

## 2019-08-03 LAB — MAGNESIUM: Magnesium: 1.9 mg/dL (ref 1.7–2.4)

## 2019-08-03 LAB — ECHOCARDIOGRAM COMPLETE
Height: 64 in
Weight: 2546.75 oz

## 2019-08-03 MED ORDER — EZETIMIBE 10 MG PO TABS
10.0000 mg | ORAL_TABLET | Freq: Every day | ORAL | Status: DC
  Administered 2019-08-03: 10 mg via ORAL
  Filled 2019-08-03: qty 1

## 2019-08-03 MED ORDER — STROKE: EARLY STAGES OF RECOVERY BOOK
Freq: Once | Status: AC
  Filled 2019-08-03: qty 1

## 2019-08-03 MED ORDER — ACETAMINOPHEN 160 MG/5ML PO SOLN
650.0000 mg | ORAL | Status: DC | PRN
  Administered 2019-08-07: 650 mg
  Filled 2019-08-03: qty 20.3

## 2019-08-03 MED ORDER — BISACODYL 10 MG RE SUPP: 10.0000 mg | Freq: Every day | RECTAL | Status: DC | PRN

## 2019-08-03 MED ORDER — ASPIRIN 325 MG PO TABS
325.0000 mg | ORAL_TABLET | Freq: Every day | ORAL | Status: DC
  Administered 2019-08-03: 325 mg via ORAL
  Filled 2019-08-03: qty 1

## 2019-08-03 MED ORDER — RESOURCE THICKENUP CLEAR PO POWD
ORAL | Status: DC | PRN
  Filled 2019-08-03 (×2): qty 125

## 2019-08-03 MED ORDER — ACETAMINOPHEN 325 MG PO TABS: 650.0000 mg | ORAL_TABLET | ORAL | Status: DC | PRN

## 2019-08-03 MED ORDER — ASPIRIN 300 MG RE SUPP: 300.0000 mg | Freq: Every day | RECTAL | Status: DC

## 2019-08-03 MED ORDER — ASPIRIN EC 81 MG PO TBEC: 81.0000 mg | DELAYED_RELEASE_TABLET | Freq: Every day | ORAL | Status: DC

## 2019-08-03 MED ORDER — STARCH (THICKENING) PO POWD
ORAL | Status: DC | PRN
  Filled 2019-08-03: qty 227

## 2019-08-03 MED ORDER — ENOXAPARIN SODIUM 40 MG/0.4ML ~~LOC~~ SOLN
40.0000 mg | SUBCUTANEOUS | Status: DC
  Administered 2019-08-03 – 2019-08-04 (×2): 40 mg via SUBCUTANEOUS
  Filled 2019-08-03: qty 0.4

## 2019-08-03 MED ORDER — POTASSIUM CHLORIDE IN NACL 40-0.9 MEQ/L-% IV SOLN
INTRAVENOUS | Status: AC
  Administered 2019-08-03: 90 mL/h via INTRAVENOUS
  Filled 2019-08-03 (×2): qty 1000

## 2019-08-03 MED ORDER — ACETAMINOPHEN 650 MG RE SUPP: 650.0000 mg | RECTAL | Status: DC | PRN

## 2019-08-03 MED ORDER — ATORVASTATIN CALCIUM 10 MG PO TABS: 10.0000 mg | ORAL_TABLET | Freq: Every day | ORAL | Status: DC

## 2019-08-03 MED ORDER — CLOPIDOGREL BISULFATE 75 MG PO TABS
75.0000 mg | ORAL_TABLET | Freq: Every day | ORAL | Status: DC
  Administered 2019-08-03: 75 mg via ORAL
  Filled 2019-08-03: qty 1

## 2019-08-03 MED ORDER — ATORVASTATIN CALCIUM 40 MG PO TABS: 40.0000 mg | ORAL_TABLET | Freq: Every day | ORAL | Status: DC

## 2019-08-03 NOTE — Progress Notes (Signed)
  Echocardiogram 2D Echocardiogram has been performed.  Michiel Cowboy 08/03/2019, 1:55 PM

## 2019-08-03 NOTE — H&P (Addendum)
History and Physical    [name] JTT:[id number] DOB: 22-Jul-1950 DOA: 08/02/2019  PCP: Pleas Koch, NP   Patient coming from: Home   Chief Complaint: Dizziness, difficulty swallowing, left facial pressure    HPI: [name] is a 69 y.o. female with medical history significant for anxiety, hypertension, and Chiari I malformation, now presenting to the emergency department with difficulty swallowing, dizziness, and left facial pressure.  Patient reports increased fatigue and mild pressure sensation in the left face for close to a month now, had otherwise been in her usual state, went to bed last night at around 11 PM, but then had some difficulty swallowing liquids at 9 AM this morning (08/02/2019).  She has occasional mild headache, but none associated with these current symptoms.  She has not noted any change in her vision or hearing and denies any focal numbness or weakness.  She denies numbness or tingling in her face and has not appreciated any facial weakness, but felt that she was having trouble swallowing soda, but then went on to have some smaller sips of water that went down okay.  Patient denies any fevers, chills, sore throat, or cough.  She denies chest pain or palpitations.  ED Course: Upon arrival to the ED, patient is found to be afebrile, saturating mid 90s on room air, and with stable blood pressure.  EKG features a sinus rhythm with LBBB.  Noncontrast head CT is notable for age-indeterminate lacunar infarction in the right lentiform nucleus/external capsule, small foci of ill-defined hypoattenuation within the right subcapsular white matter, chronic lacunar infarction within the right caudate, and Chiari I malformation with prior posterior fossa decompression.  Maxillofacial CT was notable for small left mastoid effusion.  MRI brain notable for acute ischemic infarction in the right basal ganglia without hemorrhage or mass-effect.  Intracranial MRA was normal.  Chemistry  panel with mild hypokalemia and mild renal insufficiency.  CBC unremarkable.  UDS negative, and ethanol level undetectable.  Patient was given IV fluids in the ED, neurology consulted by the ED physician, and hospitalists asked to admit.  Review of Systems:  All other systems reviewed and apart from HPI, are negative.  Past Medical History:  Diagnosis Date  . Allergy   . Anxiety   . Arnold-Chiari malformation ([place])   . Arthritis   . GERD (gastroesophageal reflux disease)   . H. pylori infection    3 years ago  . Hyperlipidemia   . Incontinence   . Syncope and collapse    Per pt, denies passing out  . Tobacco use disorder   . Unspecified essential hypertension     Past Surgical History:  Procedure Laterality Date  . APPENDECTOMY    . ARNOLD CHIARI SURGERY     neurocranial surgery  . BLEPHAROPLASTY     Bil  . C-EYE SURGERY PROCEDURE    . CHOLECYSTECTOMY    . EYE SURGERY    . MASS EXCISION Right 12/27/2013   Procedure: MINOR EXCISION OF RIGHT THUMB MUCOID CYST, DEBRIDEMENT OF INTERPHALANGEAL JOINT;  Surgeon: Cammie Sickle, MD;  Location: [facility];  Service: Orthopedics;  Laterality: Right;  . mass on thumb  right  . TOTAL KNEE ARTHROPLASTY Right 02/17/2017  . TOTAL KNEE ARTHROPLASTY Right 02/17/2017   Procedure: TOTAL KNEE ARTHROPLASTY;  Surgeon: Melrose Nakayama, MD;  Location: [facility];  Service: Orthopedics;  Laterality: Right;  . TUBAL LIGATION       reports that she has been smoking cigarettes.  She has a 50.00 pack-year smoking history. She has never used smokeless tobacco. She reports that she does not drink alcohol or use drugs.  Allergies  Allergen Reactions  . Shellfish-Derived Products Anaphylaxis  . Ativan [Lorazepam] Other (See Comments)    Makes "skin crawl" and insomnia  . Buspar [Buspirone] Nausea Only    Sweating and dizzy  . Clarithromycin Swelling  . Codeine Nausea And Vomiting  . Doxycycline Other (See Comments)    Unknown  .  Guaifenesin Other (See Comments)    Palpitations  . Oxycodone Nausea And Vomiting  . Prednisone Nausea And Vomiting and Other (See Comments)    Makes my heart race  . Statins Other (See Comments)    Muscle pain  . Sulfonamide Derivatives Nausea And Vomiting    Achiness  . Tetracycline Nausea Only  . Vicodin [Hydrocodone-Acetaminophen] Nausea And Vomiting  . Famotidine Rash  . Latex Rash  . Omeprazole Nausea Only    Cough, shortness of breath - patient doesn't remember  . Peanut-Containing Drug Products Itching and Rash  . Wellbutrin [Bupropion] Palpitations    Family History  Problem Relation Age of Onset  . Bone cancer Mother   . Heart disease Mother   . Cancer Sister        metastatic; unknown primary  . Diverticulosis Sister   . Tuberculosis Father   . Diabetes Sister   . Hypertension Sister   . Colon cancer Neg Hx   . Esophageal cancer Neg Hx   . Pancreatic cancer Neg Hx   . Stomach cancer Neg Hx   . Liver disease Neg Hx   . Kidney disease Neg Hx   . Rectal cancer Neg Hx      Prior to Admission medications   Medication Sig Start Date End Date Taking? Authorizing Provider  acetaminophen (TYLENOL) 500 MG tablet Take 1,000 mg by mouth 2 (two) times daily as needed (pain).   Yes [provider]  amLODipine-benazepril (LOTREL) 5-20 MG capsule Take 1 capsule by mouth daily. For blood pressure. 12/13/18  Yes Pleas Koch, NP  docusate sodium (COLACE) 100 MG capsule Take 100 mg by mouth daily as needed for mild constipation.   Yes [provider]  loratadine (CLARITIN) 10 MG tablet Take 10 mg by mouth daily as needed for allergies.    Yes [provider]    Physical Exam: Vitals:   08/02/19 1951 08/02/19 2230 08/02/19 2300 08/02/19 2335  BP: (!) 141/68   (!) 122/93  Pulse: 63 63 62 (!) 56  Resp: 19 18 15 17   Temp:      TempSrc:      SpO2: 96% 94% 96% 94%     Constitutional: NAD, calm  Eyes: PERTLA, lids and conjunctivae  normal ENMT: Mucous membranes are moist. Posterior pharynx clear of any exudate or lesions.   Neck: normal, supple, no masses, no thyromegaly Respiratory: no wheezing, no crackles. No accessory muscle use.  Cardiovascular: S1 & S2 heard, regular rate and rhythm. No extremity edema.   Abdomen: No distension, no tenderness, soft. Bowel sounds active.  Musculoskeletal: no clubbing / cyanosis. No joint deformity upper and lower extremities.   Skin: no significant rashes, lesions, ulcers. Warm, dry, well-perfused. Neurologic: CN 2-12 grossly intact. Sensation intact, patellar DTR normal. Strength 5/5 in all 4 limbs.  Psychiatric: Alert and oriented to person, place, and situation. Calm and cooperative.    Labs and Imaging on Admission: I have personally reviewed following labs and imaging studies  CBC: Recent Labs  Lab 08/02/19 1328 08/02/19 2030 08/02/19 2225  WBC 7.5 7.0  --   NEUTROABS  --  4.2  --   HGB 15.0 14.1 13.9  HCT 45.4 42.6 41.0  MCV 91.2 88.9  --   PLT 255 242  --    Basic Metabolic Panel: Recent Labs  Lab 08/02/19 1328 08/02/19 2225  NA 141 143  K 3.3* 3.2*  CL 107 105  CO2 24  --   GLUCOSE 90 104*  BUN 9 10  CREATININE 1.23* 1.00  CALCIUM 9.1  --    GFR: CrCl cannot be calculated (Unknown ideal weight.). Liver Function Tests: No results for input(s): AST, ALT, ALKPHOS, BILITOT, PROT, ALBUMIN in the last 168 hours. No results for input(s): LIPASE, AMYLASE in the last 168 hours. No results for input(s): AMMONIA in the last 168 hours. Coagulation Profile: Recent Labs  Lab 08/02/19 2030  INR 1.0   Cardiac Enzymes: No results for input(s): CKTOTAL, CKMB, CKMBINDEX, TROPONINI in the last 168 hours. BNP (last 3 results) No results for input(s): PROBNP in the last 8760 hours. HbA1C: No results for input(s): HGBA1C in the last 72 hours. CBG: No results for input(s): GLUCAP in the last 168 hours. Lipid Profile: No results for input(s): CHOL, HDL,  LDLCALC, TRIG, CHOLHDL, LDLDIRECT in the last 72 hours. Thyroid Function Tests: No results for input(s): TSH, T4TOTAL, FREET4, T3FREE, THYROIDAB in the last 72 hours. Anemia Panel: No results for input(s): VITAMINB12, FOLATE, FERRITIN, TIBC, IRON, RETICCTPCT in the last 72 hours. Urine analysis:    Component Value Date/Time   COLORURINE STRAW (A) 08/02/2019 2100   APPEARANCEUR CLEAR 08/02/2019 2100   LABSPEC 1.003 (L) 08/02/2019 2100   PHURINE 7.0 08/02/2019 2100   GLUCOSEU NEGATIVE 08/02/2019 2100   HGBUR NEGATIVE 08/02/2019 2100   HGBUR negative 03/01/2007 1207   [place] 08/02/2019 2100   BILIRUBINUR NEG 01/21/2017 1147   [place] 08/02/2019 2100   PROTEINUR NEGATIVE 08/02/2019 2100   UROBILINOGEN 0.2 01/21/2017 1147   UROBILINOGEN 0.2 01/12/2011 1203   NITRITE NEGATIVE 08/02/2019 2100   LEUKOCYTESUR NEGATIVE 08/02/2019 2100   Sepsis Labs: @LABRCNTIP (procalcitonin:4,lacticidven:4) )No results found for this or any previous visit (from the past 240 hour(s)).   Radiological Exams on Admission: CT Head Wo Contrast  Result Date: 08/02/2019 CLINICAL DATA:  Transient ischemic attack (TIA). Maxillofacial pain. Additional history provided: Patient presents for left-sided facial pressure and dizziness, pressure feeling radiates to left side of face and to ear. EXAM: CT HEAD WITHOUT CONTRAST CT MAXILLOFACIAL WITHOUT CONTRAST TECHNIQUE: Contiguous axial images were obtained from the base of the skull through the vertex without intravenous contrast. Multidetector CT imaging of the maxillofacial structures was performed. Multiplanar CT image reconstructions were also generated. A small metallic BB was placed on the right temple in order to reliably differentiate right from left. COMPARISON:  No pertinent prior studies available for comparison. FINDINGS: CT HEAD FINDINGS Brain: Streak artifact limits evaluation of the inferomedial cerebellum. There is no evidence of acute  intracranial hemorrhage. No demarcated cortical infarction. No evidence of intracranial mass. No midline shift or extra-axial fluid collection. There is a small region of ill-defined hypodensity the right frontal lobe white matter (series 4, images 20-24). Age-indeterminate lacunar infarct within the right lentiform nucleus/external capsule (series 4, images 16 and 17). Small chronic lacunar infarct within the right caudate. 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 Mild generalized parenchymal atrophy. Vascular: No hyperdense vessel.  Atherosclerotic calcifications Skull: Sequela of  prior posterior fossa decompression, including a postsurgical defect within the C1 posterior arch. No calvarial fracture or aggressive osseous lesion. CT MAXILLOFACIAL FINDINGS Osseous: No maxillofacial fracture. Small bony exostosis along the superficial aspect of the posterior right mandibular body (series 5, image 29). Orbits: No abnormality identified. Sinuses: No significant paranasal sinus disease. Soft tissues: Carotid artery calcified plaque. The visualized maxillofacial and upper neck soft tissues are otherwise unremarkable. Streak artifact from dental restoration significant limits evaluation of the oral cavity and also somewhat limits evaluation of the oropharynx. Within this limitation, there is no appreciable mass or swelling within the oral cavity, pharynx or larynx. Other: The temporomandibular joints are unremarkable. Left mastoid effusion. IMPRESSION: CT head: 1. Age-indeterminate lacunar infarct within the right lentiform nucleus/external capsule. 2. Small focus of ill-defined hypoattenuation within the right frontal lobe subcortical white matter which may reflect chronic ischemic change. A recent white matter infarct at this site cannot be excluded. 3. Chronic lacunar infarct within the right caudate. 4. 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 5. Mild generalized  parenchymal atrophy. CT maxillofacial: 1. Streak artifact from dental restoration limits evaluation of the oral cavity and oropharynx. Within this limitation, no swelling or discrete mass is appreciated within the oral cavity or pharynx. 2. Small left mastoid effusion. Electronically Signed   By: Kellie Simmering DO   On: 08/02/2019 18:50   MR ANGIO HEAD WO CONTRAST  Result Date: 08/02/2019 CLINICAL DATA:  Left-sided facial numbness EXAM: MRI HEAD WITHOUT CONTRAST MRA HEAD WITHOUT CONTRAST TECHNIQUE: Multiplanar, multiecho pulse sequences of the brain and surrounding structures were obtained without intravenous contrast. Angiographic images of the head were obtained using MRA technique without contrast. COMPARISON:  08/02/2019 FINDINGS: MRI HEAD FINDINGS Brain: There is an area of abnormal diffusion restriction within the right basal ganglia. Multifocal white matter hyperintensity, most commonly due to chronic ischemic microangiopathy. Normal volume of CSF spaces. No chronic microhemorrhage. Normal midline structures. Vascular: Normal flow voids. Skull and upper cervical spine: Normal marrow signal. Sinuses/Orbits: Small amount of left mastoid fluid. Sinuses are clear. Normal orbits. Other: None MRA HEAD FINDINGS POSTERIOR CIRCULATION: --Vertebral arteries: Normal V4 segments. --Posterior inferior cerebellar arteries (PICA): Patent origins from the vertebral arteries. --Anterior inferior cerebellar arteries (AICA): Patent origins from the basilar artery. --Basilar artery: Normal. --Superior cerebellar arteries: Normal. --Posterior cerebral arteries: Normal. Both originate from the basilar artery. Posterior communicating arteries (p-comm) are diminutive or absent. ANTERIOR CIRCULATION: --Intracranial internal carotid arteries: Normal. --Anterior cerebral arteries (ACA): Normal. Both A1 segments are present. Patent anterior communicating artery (a-comm). --Middle cerebral arteries (MCA): Normal. IMPRESSION: 1. Acute  ischemic infarct of the right basal ganglia. No hemorrhage or mass effect. 2. Normal intracranial MRA. Electronically Signed   By: Ulyses Jarred M.D.   On: 08/02/2019 22:02   MR BRAIN WO CONTRAST  Result Date: 08/02/2019 CLINICAL DATA:  Left-sided facial numbness EXAM: MRI HEAD WITHOUT CONTRAST MRA HEAD WITHOUT CONTRAST TECHNIQUE: Multiplanar, multiecho pulse sequences of the brain and surrounding structures were obtained without intravenous contrast. Angiographic images of the head were obtained using MRA technique without contrast. COMPARISON:  08/02/2019 FINDINGS: MRI HEAD FINDINGS Brain: There is an area of abnormal diffusion restriction within the right basal ganglia. Multifocal white matter hyperintensity, most commonly due to chronic ischemic microangiopathy. Normal volume of CSF spaces. No chronic microhemorrhage. Normal midline structures. Vascular: Normal flow voids. Skull and upper cervical spine: Normal marrow signal. Sinuses/Orbits: Small amount of left mastoid fluid. Sinuses are clear. Normal orbits. Other: None MRA HEAD  FINDINGS POSTERIOR CIRCULATION: --Vertebral arteries: Normal V4 segments. --Posterior inferior cerebellar arteries (PICA): Patent origins from the vertebral arteries. --Anterior inferior cerebellar arteries (AICA): Patent origins from the basilar artery. --Basilar artery: Normal. --Superior cerebellar arteries: Normal. --Posterior cerebral arteries: Normal. Both originate from the basilar artery. Posterior communicating arteries (p-comm) are diminutive or absent. ANTERIOR CIRCULATION: --Intracranial internal carotid arteries: Normal. --Anterior cerebral arteries (ACA): Normal. Both A1 segments are present. Patent anterior communicating artery (a-comm). --Middle cerebral arteries (MCA): Normal. IMPRESSION: 1. 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 No hemorrhage or mass effect. 2. Normal intracranial MRA. Electronically Signed   By: Ulyses Jarred M.D.   On: 08/02/2019  22:02   CT Maxillofacial Wo Contrast  Result Date: 08/02/2019 CLINICAL DATA:  Transient ischemic attack (TIA). Maxillofacial pain. Additional history provided: Patient presents for left-sided facial pressure and dizziness, pressure feeling radiates to left side of face and to ear. EXAM: CT HEAD WITHOUT CONTRAST CT MAXILLOFACIAL WITHOUT CONTRAST TECHNIQUE: Contiguous axial images were obtained from the base of the skull through the vertex without intravenous contrast. Multidetector CT imaging of the maxillofacial structures was performed. Multiplanar CT image reconstructions were also generated. A small metallic BB was placed on the right temple in order to reliably differentiate right from left. COMPARISON:  No pertinent prior studies available for comparison. FINDINGS: CT HEAD FINDINGS Brain: Streak artifact limits evaluation of the inferomedial cerebellum. There is no evidence of acute intracranial hemorrhage. No demarcated cortical infarction. No evidence of intracranial mass. No midline shift or extra-axial fluid collection. There is a small region of ill-defined hypodensity the right frontal lobe white matter (series 4, images 20-24). Age-indeterminate lacunar infarct within the right lentiform nucleus/external capsule (series 4, images 16 and 17). Small chronic lacunar infarct within the right caudate. 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 Mild generalized parenchymal atrophy. Vascular: No hyperdense vessel.  Atherosclerotic calcifications Skull: Sequela of prior posterior fossa decompression, including a postsurgical defect within the C1 posterior arch. No calvarial fracture or aggressive osseous lesion. CT MAXILLOFACIAL FINDINGS Osseous: No maxillofacial fracture. Small bony exostosis along the superficial aspect of the posterior right mandibular body (series 5, image 29). Orbits: No abnormality identified. Sinuses: No significant paranasal sinus disease. Soft tissues: Carotid  artery calcified plaque. The visualized maxillofacial and upper neck soft tissues are otherwise unremarkable. Streak artifact from dental restoration significant limits evaluation of the oral cavity and also somewhat limits evaluation of the oropharynx. Within this limitation, there is no appreciable mass or swelling within the oral cavity, pharynx or larynx. Other: The temporomandibular joints are unremarkable. Left mastoid effusion. IMPRESSION: CT head: 1. Age-indeterminate lacunar infarct within the right lentiform nucleus/external capsule. 2. Small focus of ill-defined hypoattenuation within the right frontal lobe subcortical white matter which may reflect chronic ischemic change. A recent white matter infarct at this site cannot be excluded. 3. Chronic lacunar infarct within the right caudate. 4. 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 5. Mild generalized parenchymal atrophy. CT maxillofacial: 1. Streak artifact from dental restoration limits evaluation of the oral cavity and oropharynx. Within this limitation, no swelling or discrete mass is appreciated within the oral cavity or pharynx. 2. Small left mastoid effusion. Electronically Signed   By: Kellie Simmering DO   On: 08/02/2019 18:50    EKG: Independently reviewed. Sinus rhythm, LBBB.   Assessment/Plan   1. Acute ischemic stroke  - Presents with difficulty swallowing that she noticed at ~9am after going to bed in her usual state the  night before  - CT head with chronic-appearing infarctions and MRI brain reveals acute ischemic infarction in right basal ganglia without hemorrhage or mass-effect  - Neurology is consulting and much appreciated  - Continue cardiac monitoring and neuro checks, start ASA, consult with PT, OT, and SLP, check echocardiogram, carotid imaging, A1c, and fasting lipid panel    2. CKD IIIa  - SCr is 1.23 on admission, up from ~1 last year  - She was given 500 cc NS in ED, failed swallow screen in ED,  and will be kept on maintenance IVF until tolerating oral intake    3. Hypokalemia  - She failed swallow eval in ED and KCl added to IVF  - Repeat chem panel in am   4. Left bundle branch block  - Noted on EKG in ED, appears new  - She has not had any chest pain, SOB, nausea, or diaphoresis  - Cardiac monitoring will be continued and echocardiogram ordered    DVT prophylaxis: Lovenox  Code Status: Full  Family Communication: Discussed with patient  Disposition Plan: From home. Will likely be able to return home within 24-48 hours pending neurology, PT, OT, and SLP consultations.   Consults called: Neurology  Admission status: Observation     Vianne Bulls, MD [facility] Pager: See [email or web address]  If 7AM-7PM, please contact the daytime attending [email or web address]  08/03/2019, 12:13 AM

## 2019-08-03 NOTE — Progress Notes (Signed)
STROKE TEAM PROGRESS NOTE   INTERVAL HISTORY I have taken history of presenting illness, reviewed personally imaging films in PACS and electronic medical records.  She presented with left facial numbness as well as left-sided weakness dizziness and trouble swallowing.  MRI shows a large 3 cm right basal ganglia infarct.  MRI of the brain appears normal.  She has no history of cardiac arrhythmias or atrial fibrillation.  Carotid ultrasound shows no significant extracranial stenosis.  Telemetry monitoring shows no A. fib.  Transthoracic echo is pending  Vitals:   08/03/19 0407 08/03/19 0611 08/03/19 0804 08/03/19 1121  BP: (!) 156/66 (!) 148/74 (!) 151/68 (!) 143/64  Pulse: (!) 54 (!) 51 62 (!) 51  Resp: 18 20 16 16   Temp: 98.3 F (36.8 C) 98.2 F (36.8 C) 98.1 F (36.7 C) 98.4 F (36.9 C)  TempSrc: Oral Oral Oral Oral  SpO2: 93% 93% 94% 96%  Weight:      Height:        CBC:  Recent Labs  Lab 08/02/19 2030 08/02/19 2030 08/02/19 2225 08/03/19 1000  WBC 7.0  --   --  6.6  NEUTROABS 4.2  --   --  4.3  HGB 14.1   < > 13.9 14.3  HCT 42.6   < > 41.0 43.3  MCV 88.9  --   --  91.2  PLT 242  --   --  251   < > = values in this interval not displayed.    Basic Metabolic Panel:  Recent Labs  Lab 08/02/19 1328 08/02/19 1328 08/02/19 2225 08/03/19 0249 08/03/19 1000  NA 141   < > 143  --  143  K 3.3*   < > 3.2*  --  3.9  CL 107   < > 105  --  107  CO2 24  --   --   --  25  GLUCOSE 90   < > 104*  --  103*  BUN 9   < > 10  --  9  CREATININE 1.23*   < > 1.00  --  1.04*  CALCIUM 9.1  --   --   --  8.8*  MG  --   --   --  1.9  --    < > = values in this interval not displayed.   Lipid Panel:     Component Value Date/Time   CHOL 232 (H) 08/03/2019 0249   TRIG 128 08/03/2019 0249   HDL 31 (L) 08/03/2019 0249   CHOLHDL 7.5 08/03/2019 0249   VLDL 26 08/03/2019 0249   LDLCALC 175 (H) 08/03/2019 0249   HgbA1c:  Lab Results  Component Value Date   HGBA1C 5.7 (H) 08/03/2019    Urine Drug Screen:     Component Value Date/Time   LABOPIA NONE DETECTED 08/02/2019 2100   COCAINSCRNUR NONE DETECTED 08/02/2019 2100   LABBENZ NONE DETECTED 08/02/2019 2100   AMPHETMU NONE DETECTED 08/02/2019 2100   THCU NONE DETECTED 08/02/2019 2100   LABBARB NONE DETECTED 08/02/2019 2100    Alcohol Level     Component Value Date/Time   ETH <10 08/02/2019 2030    IMAGING past 48 hours CT Head Wo Contrast  Result Date: 08/02/2019 CLINICAL DATA:  Transient ischemic attack (TIA). Maxillofacial pain. Additional history provided: Patient presents for left-sided facial pressure and dizziness, pressure feeling radiates to left side of face and to ear. EXAM: CT HEAD WITHOUT CONTRAST CT MAXILLOFACIAL WITHOUT CONTRAST TECHNIQUE: Contiguous axial images were obtained  from the base of the skull through the vertex without intravenous contrast. Multidetector CT imaging of the maxillofacial structures was performed. Multiplanar CT image reconstructions were also generated. A small metallic BB was placed on the right temple in order to reliably differentiate right from left. COMPARISON:  No pertinent prior studies available for comparison. FINDINGS: CT HEAD FINDINGS Brain: Streak artifact limits evaluation of the inferomedial cerebellum. There is no evidence of acute intracranial hemorrhage. No demarcated cortical infarction. No evidence of intracranial mass. No midline shift or extra-axial fluid collection. There is a small region of ill-defined hypodensity the right frontal lobe white matter (series 4, images 20-24). Age-indeterminate lacunar infarct within the right lentiform nucleus/external capsule (series 4, images 16 and 17). Small chronic lacunar infarct within the right caudate. 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 Mild generalized parenchymal atrophy. Vascular: No hyperdense vessel.  Atherosclerotic calcifications Skull: Sequela of prior posterior fossa decompression,  including a postsurgical defect within the C1 posterior arch. No calvarial fracture or aggressive osseous lesion. CT MAXILLOFACIAL FINDINGS Osseous: No maxillofacial fracture. Small bony exostosis along the superficial aspect of the posterior right mandibular body (series 5, image 29). Orbits: No abnormality identified. Sinuses: No significant paranasal sinus disease. Soft tissues: Carotid artery calcified plaque. The visualized maxillofacial and upper neck soft tissues are otherwise unremarkable. Streak artifact from dental restoration significant limits evaluation of the oral cavity and also somewhat limits evaluation of the oropharynx. Within this limitation, there is no appreciable mass or swelling within the oral cavity, pharynx or larynx. Other: The temporomandibular joints are unremarkable. Left mastoid effusion. IMPRESSION: CT head: 1. Age-indeterminate lacunar infarct within the right lentiform nucleus/external capsule. 2. Small focus of ill-defined hypoattenuation within the right frontal lobe subcortical white matter which may reflect chronic ischemic change. A recent white matter infarct at this site cannot be excluded. 3. Chronic lacunar infarct within the right caudate. 4. 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 5. Mild generalized parenchymal atrophy. CT maxillofacial: 1. Streak artifact from dental restoration limits evaluation of the oral cavity and oropharynx. Within this limitation, no swelling or discrete mass is appreciated within the oral cavity or pharynx. 2. Small left mastoid effusion. Electronically Signed   By: Kellie Simmering DO   On: 08/02/2019 18:50   MR ANGIO HEAD WO CONTRAST  Result Date: 08/02/2019 CLINICAL DATA:  Left-sided facial numbness EXAM: MRI HEAD WITHOUT CONTRAST MRA HEAD WITHOUT CONTRAST TECHNIQUE: Multiplanar, multiecho pulse sequences of the brain and surrounding structures were obtained without intravenous contrast. Angiographic images of the  head were obtained using MRA technique without contrast. COMPARISON:  08/02/2019 FINDINGS: MRI HEAD FINDINGS Brain: There is an area of abnormal diffusion restriction within the right basal ganglia. Multifocal white matter hyperintensity, most commonly due to chronic ischemic microangiopathy. Normal volume of CSF spaces. No chronic microhemorrhage. Normal midline structures. Vascular: Normal flow voids. Skull and upper cervical spine: Normal marrow signal. Sinuses/Orbits: Small amount of left mastoid fluid. Sinuses are clear. Normal orbits. Other: None MRA HEAD FINDINGS POSTERIOR CIRCULATION: --Vertebral arteries: Normal V4 segments. --Posterior inferior cerebellar arteries (PICA): Patent origins from the vertebral arteries. --Anterior inferior cerebellar arteries (AICA): Patent origins from the basilar artery. --Basilar artery: Normal. --Superior cerebellar arteries: Normal. --Posterior cerebral arteries: Normal. Both originate from the basilar artery. Posterior communicating arteries (p-comm) are diminutive or absent. ANTERIOR CIRCULATION: --Intracranial internal carotid arteries: Normal. --Anterior cerebral arteries (ACA): Normal. Both A1 segments are present. Patent anterior communicating artery (a-comm). --Middle cerebral arteries (MCA): Normal. IMPRESSION: 1.  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 No hemorrhage or mass effect. 2. Normal intracranial MRA. Electronically Signed   By: Ulyses Jarred M.D.   On: 08/02/2019 22:02   MR BRAIN WO CONTRAST  Result Date: 08/02/2019 CLINICAL DATA:  Left-sided facial numbness EXAM: MRI HEAD WITHOUT CONTRAST MRA HEAD WITHOUT CONTRAST TECHNIQUE: Multiplanar, multiecho pulse sequences of the brain and surrounding structures were obtained without intravenous contrast. Angiographic images of the head were obtained using MRA technique without contrast. COMPARISON:  08/02/2019 FINDINGS: MRI HEAD FINDINGS Brain: There is an area of abnormal diffusion restriction  within the right basal ganglia. Multifocal white matter hyperintensity, most commonly due to chronic ischemic microangiopathy. Normal volume of CSF spaces. No chronic microhemorrhage. Normal midline structures. Vascular: Normal flow voids. Skull and upper cervical spine: Normal marrow signal. Sinuses/Orbits: Small amount of left mastoid fluid. Sinuses are clear. Normal orbits. Other: None MRA HEAD FINDINGS POSTERIOR CIRCULATION: --Vertebral arteries: Normal V4 segments. --Posterior inferior cerebellar arteries (PICA): Patent origins from the vertebral arteries. --Anterior inferior cerebellar arteries (AICA): Patent origins from the basilar artery. --Basilar artery: Normal. --Superior cerebellar arteries: Normal. --Posterior cerebral arteries: Normal. Both originate from the basilar artery. Posterior communicating arteries (p-comm) are diminutive or absent. ANTERIOR CIRCULATION: --Intracranial internal carotid arteries: Normal. --Anterior cerebral arteries (ACA): Normal. Both A1 segments are present. Patent anterior communicating artery (a-comm). --Middle cerebral arteries (MCA): Normal. IMPRESSION: 1. 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 No hemorrhage or mass effect. 2. Normal intracranial MRA. Electronically Signed   By: Ulyses Jarred M.D.   On: 08/02/2019 22:02   CT Maxillofacial Wo Contrast  Result Date: 08/02/2019 CLINICAL DATA:  Transient ischemic attack (TIA). Maxillofacial pain. Additional history provided: Patient presents for left-sided facial pressure and dizziness, pressure feeling radiates to left side of face and to ear. EXAM: CT HEAD WITHOUT CONTRAST CT MAXILLOFACIAL WITHOUT CONTRAST TECHNIQUE: Contiguous axial images were obtained from the base of the skull through the vertex without intravenous contrast. Multidetector CT imaging of the maxillofacial structures was performed. Multiplanar CT image reconstructions were also generated. A small metallic BB was placed on the right temple  in order to reliably differentiate right from left. COMPARISON:  No pertinent prior studies available for comparison. FINDINGS: CT HEAD FINDINGS Brain: Streak artifact limits evaluation of the inferomedial cerebellum. There is no evidence of acute intracranial hemorrhage. No demarcated cortical infarction. No evidence of intracranial mass. No midline shift or extra-axial fluid collection. There is a small region of ill-defined hypodensity the right frontal lobe white matter (series 4, images 20-24). Age-indeterminate lacunar infarct within the right lentiform nucleus/external capsule (series 4, images 16 and 17). Small chronic lacunar infarct within the right caudate. 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 Mild generalized parenchymal atrophy. Vascular: No hyperdense vessel.  Atherosclerotic calcifications Skull: Sequela of prior posterior fossa decompression, including a postsurgical defect within the C1 posterior arch. No calvarial fracture or aggressive osseous lesion. CT MAXILLOFACIAL FINDINGS Osseous: No maxillofacial fracture. Small bony exostosis along the superficial aspect of the posterior right mandibular body (series 5, image 29). Orbits: No abnormality identified. Sinuses: No significant paranasal sinus disease. Soft tissues: Carotid artery calcified plaque. The visualized maxillofacial and upper neck soft tissues are otherwise unremarkable. Streak artifact from dental restoration significant limits evaluation of the oral cavity and also somewhat limits evaluation of the oropharynx. Within this limitation, there is no appreciable mass or swelling within the oral cavity, pharynx or larynx. Other: The temporomandibular joints are unremarkable. Left mastoid effusion.  IMPRESSION: CT head: 1. Age-indeterminate lacunar infarct within the right lentiform nucleus/external capsule. 2. Small focus of ill-defined hypoattenuation within the right frontal lobe subcortical white matter which  may reflect chronic ischemic change. A recent white matter infarct at this site cannot be excluded. 3. Chronic lacunar infarct within the right caudate. 4. 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 5. Mild generalized parenchymal atrophy. CT maxillofacial: 1. Streak artifact from dental restoration limits evaluation of the oral cavity and oropharynx. Within this limitation, no swelling or discrete mass is appreciated within the oral cavity or pharynx. 2. Small left mastoid effusion. Electronically Signed   By: Kellie Simmering DO   On: 08/02/2019 18:50    PHYSICAL EXAM Pleasant mildly obese elderly Caucasian lady not in distress. . Afebrile. Head is nontraumatic. Neck is supple without bruit.    Cardiac exam no murmur or gallop. Lungs are clear to auscultation. Distal pulses are well felt. Neurological Exam ;  Awake  Alert oriented x 3. Normal speech and language.eye movements full without nystagmus.fundi were not visualized. Vision acuity and fields appear normal. Hearing is normal. Palatal movements are normal. Face symmetric. Tongue midline. Normal strength, tone, reflexes and coordination.  Except mild diminished fine finger movements on the left.  Orbits right over left upper extremity.  Normal sensation except over the left cheek where there is some hyperesthesia.. Gait deferred.  ASSESSMENT/PLAN Ms. TINLEE NAVARRETTE is a 69 y.o. female with history of Chiari I malformation s/p decompression, HLD and HTN presenting with dysphagia, L hemiparesis, dizziness and L facial pressure.   Stroke:   Large R basal ganglia infarct embolic secondary to unknown source  CT head age indeterminate R lentiform nucleus / external capsule infarct. Small R frontal lobe subcortical white matter hypoattenuation, ? Chronic infarct. Old R caudate infarct.  Chiari I malformation. Mild atrophy.   CT Maxillofacial no mass, small L mastoid effusion  MRI  Acute R basal ganglia infarct   MRA   normal  Carotid Doppler bilateral mild plaques and 1-39% carotid stenosis 2D Echo pending   [place] electrophysiologist will consult and consider placement of an implantable loop recorder to evaluate for atrial fibrillation as etiology of stroke. This has been explained to patient by Dr. Leonie Man and she is agreeable. Will place tomorrow.  LDL 175  HgbA1c 5.7  Lovenox 40 mg sq daily for VTE prophylaxis  No antithrombotic prior to admission, now on aspirin 325 mg daily. Will change to aspirin 81, add plavix. Continue DAPT x 3 weeks then ASPIRIN alone.   Therapy recommendations:  No PT or OT. F/u SLP for swallow  Disposition:  Return home  Hypertension  Stable . Permissive hypertension (OK if < 220/120) but gradually normalize in 5-7 days . Long-term BP goal normotensive  Hyperlipidemia  Home meds:  No statin  LDL 175, goal < 70  Intolerant to statins (muscle pain)  Add zetia   Consider [facility] as an OP  Continue statin at discharge  Dysphagia . Secondary to stroke . Cleared for D3 nectar thick liquids . Speech on board   Other Stroke Risk Factors  Advanced age  Cigarette smoker, advised to stop smoking  Overweight, Body mass index is 27.32 kg/m., recommend weight loss, diet and exercise as appropriate   Other Active Problems  Hx Chiari I decompression  CKD stage III  Hypokalemia, repleted, resolved  LBBB  Hospital day # 0 She presented with left-sided weakness, numbness and swallowing difficulties due to  large right subcortical infarct likely of cryptogenic etiology as it is too large to be lacunar infarct from small vessel disease.  Recommend continue ongoing stroke work-up.  If 2D echo is unremarkable she will need loop recorder for strong suspicion of paroxysmal A. fib.  Start aspirin and Plavix for 3 weeks followed by aspirin alone and aggressive risk factor modification.  Start statin.  Long discussion with patient and  answered questions.  Discussed with Dr. Grandville Silos.  Greater than 50% time during this 35-minute visit was spent on counseling and coordination of care about her cryptogenic stroke and discussion about evaluation and treatment plan and answering questions Antony Contras, MD To contact Stroke Continuity provider, please refer to [email or web address] After hours, contact General Neurology

## 2019-08-03 NOTE — Evaluation (Addendum)
Clinical/Bedside Swallow Evaluation Patient Details  Name: [name] MRN: [id number] Date of Birth: 06/02/1951  Today's Date: 08/03/2019 Time: SLP Start Time (ACUTE ONLY): 1607 SLP Stop Time (ACUTE ONLY): 1005 SLP Time Calculation (min) (ACUTE ONLY): 39 min  Past Medical History:  Past Medical History:  Diagnosis Date  . Allergy   . Anxiety   . Arnold-Chiari malformation ([place])   . Arthritis   . GERD (gastroesophageal reflux disease)   . H. pylori infection    3 years ago  . Hyperlipidemia   . Incontinence   . Syncope and collapse    Per pt, denies passing out  . Tobacco use disorder   . Unspecified essential hypertension    Past Surgical History:  Past Surgical History:  Procedure Laterality Date  . APPENDECTOMY    . ARNOLD CHIARI SURGERY     neurocranial surgery  . BLEPHAROPLASTY     Bil  . C-EYE SURGERY PROCEDURE    . CHOLECYSTECTOMY    . EYE SURGERY    . MASS EXCISION Right 12/27/2013   Procedure: MINOR EXCISION OF RIGHT THUMB MUCOID CYST, DEBRIDEMENT OF INTERPHALANGEAL JOINT;  Surgeon: Cammie Sickle, MD;  Location: [facility];  Service: Orthopedics;  Laterality: Right;  . mass on thumb  right  . TOTAL KNEE ARTHROPLASTY Right 02/17/2017  . TOTAL KNEE ARTHROPLASTY Right 02/17/2017   Procedure: TOTAL KNEE ARTHROPLASTY;  Surgeon: Melrose Nakayama, MD;  Location: [facility];  Service: Orthopedics;  Laterality: Right;  . TUBAL LIGATION     HPI:  [name] is a 69 y.o. female with medical history significant for anxiety, hypertension, and Chiari I malformation, presented to the emergency department with difficulty swallowing, dizziness, and left facial pressure on 08/02/19. 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    Assessment / Plan / Recommendation Clinical Impression   Patient received sitting upright in chair, breathing comfortably on room air. Patient endorses "coughing when I drink water" that she reports "has been  happening my whole life." She also reports "scratchiness on the left side of my throat" when she drinks water, but not with other liquids or solids.  She reports the "scratchy" sensation began "a few weeks ago." She goes on to state "I think it has something to do with my sinuses." She denies pain with swallowing, denies globus sensation. Cranial nerve exam and oral mechanism exam unremarkable.   No overt s/sx aspiration with ice chips or small spoon sips of thin liquid (water). With cup sips of thin liquids, patient with immediate coughing & facial redness x2, patient with delayed coughing (>15 seconds after the swallow) x2. Patient requesting to blow her nose following sips of water, reporting facial "fullness." Patient reports she does not use straws. Patient seen with cup sips of NTL, spontaneous double swallow noted x1, no other overt s/sx aspiration despite thorough challenging. Pt stating "this feels smooth." With puree solids, patient with adequate oral acceptance and AP transport, swallow initiation appeared timely based on bedside presentation. No overt s/sx aspiration. With soft solids and regular solids (chopped peaches, soft granola bar and crackers), oral phase remarkable for minimally prolonged mastication prior to initiation of the swallow, good oral clearance achieved. No overt s/sx aspiration with any solid consistency seen. Patient denies swallowing difficulty with solid textures.  Recommend MBSS to objectively assess swallow in next few days. Patient's medical history notable for Chiari I malformation which can result in dysphagia symptomology in some instances.  Diet recommendations: Dysphagia 3 solids (per patient preference) and nectar thick liquids. Medications whole or crushed in puree. Small bites/sips, frequent oral care, sit upright for PO intake.  Patient okay to have ice chips or small sips of water following thorough oral care, when sitting upright.     SLP Visit Diagnosis:  Dysphagia, unspecified (R13.10)    Aspiration Risk  Mild aspiration risk    Diet Recommendation Dysphagia 3 (Mech soft);Nectar-thick liquid   Liquid Administration via: Cup Medication Administration: Other (Comment)(whole or crushed with puree) Supervision: Intermittent supervision to cue for compensatory strategies Compensations: Minimize environmental distractions;Slow rate;Small sips/bites    Other  Recommendations Recommended Consults: Consider ENT evaluation Oral Care Recommendations: Oral care QID;Oral care prior to ice chip/H20 Other Recommendations: Order thickener from pharmacy   Follow up Recommendations Other (comment)(TBD)      Frequency and Duration min 2x/week  2 weeks       Prognosis Prognosis for Safe Diet Advancement: Good      Swallow Study   General HPI: [name] is a 69 y.o. female with medical history significant for anxiety, hypertension, and Chiari I malformation, presented to the emergency department with difficulty swallowing, dizziness, and left facial pressure on 08/02/19. 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  Type of Study: Bedside Swallow Evaluation Diet Prior to this Study: Regular;Thin liquids;Other (Comment)(NPO pending this BSE) Temperature Spikes Noted: No Respiratory Status: Room air History of Recent Intubation: No Behavior/Cognition: Alert Oral Cavity Assessment: Within Functional Limits Oral Cavity - Dentition: Adequate natural dentition Vision: Functional for self-feeding Self-Feeding Abilities: Able to feed self Patient Positioning: Upright in chair Baseline Vocal Quality: Low vocal intensity Volitional Cough: Strong Volitional Swallow: Able to elicit    Oral/Motor/Sensory Function Overall Oral Motor/Sensory Function: Within functional limits   Ice Chips Ice chips: Within functional limits   Thin Liquid Thin Liquid: Impaired Presentation: Self Fed;Cup;Spoon Pharyngeal  Phase Impairments:  Cough - Immediate;Cough - Delayed;Multiple swallows    Nectar Thick Nectar Thick Liquid: Within functional limits Presentation: Cup;Self Fed   Honey Thick Honey Thick Liquid: Not tested   Puree Puree: Within functional limits Presentation: Self Fed;Spoon   Solid     Solid: Within functional limits Presentation: Self Fed      Amire Gossen 08/03/2019,10:13 AM  Marina Goodell, M.Ed., [facility] [phone]: Acute Rehab office [phone] - pager

## 2019-08-03 NOTE — Evaluation (Signed)
Occupational Therapy Evaluation Patient Details Name: [name] MRN: [id number] DOB: 04/21/1951 Today's Date: 08/03/2019    History of Present Illness  [name] is a 69 y.o. female with medical history significant for anxiety, hypertension, and Chiari I malformation, now presenting to the emergency department with difficulty swallowing, dizziness, and left facial pressure. 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    Clinical Impression   Patient presenting with decreased I in self care, balance, functional mobility/transfers, endurance, and strength. Patient reports being independent  PTA. Patient currently functioning at min A level with balance deficits requiring hands on assist with higher level balance challenges.  Patient will benefit from acute OT to increase overall independence in the areas of ADLs, functional mobility, and safety awareness in order to safely discharge to next venue of care.    Follow Up Recommendations  No OT follow up;Supervision/Assistance - 24 hour    Equipment Recommendations  None recommended by OT       Precautions / Restrictions Precautions Precautions: Fall Restrictions Weight Bearing Restrictions: No      Mobility Bed Mobility Overal bed mobility: Independent       Transfers Overall transfer level: Independent Equipment used: None          Balance Overall balance assessment: Needs assistance        Standardized Balance Assessment Standardized Balance Assessment : Dynamic Gait Index   Dynamic Gait Index Level Surface: Mild Impairment Change in Gait Speed: Mild Impairment Gait with Horizontal Head Turns: Mild Impairment Gait with Vertical Head Turns: Mild Impairment Gait and Pivot Turn: Normal Step Over Obstacle: Moderate Impairment Step Around Obstacles: Mild Impairment Steps: Moderate Impairment Total Score: 15     ADL either performed or assessed with clinical judgement   ADL  Overall ADL's : Needs assistance/impaired     Grooming: Wash/dry hands;Wash/dry face;Oral care;Standing;Supervision/safety   Upper Body Bathing: Sitting;Set up   Lower Body Bathing: Minimal assistance;Sit to/from stand   Upper Body Dressing : Set up;Sitting   Lower Body Dressing: Minimal assistance;Sit to/from stand   Toilet Transfer: Designer, fashion/clothing and Hygiene: Min guard               Vision Baseline Vision/History: No visual deficits Patient Visual Report: No change from baseline              Pertinent Vitals/Pain Pain Assessment: No/denies pain     Hand Dominance Right   Extremity/Trunk Assessment Upper Extremity Assessment Upper Extremity Assessment: Overall WFL for tasks assessed   Lower Extremity Assessment Lower Extremity Assessment: Defer to PT evaluation RLE Deficits / Details: Grossly 5/5  LLE Deficits / Details: Grossly 5/5 except hip flexion 4+/5       Communication Communication Communication: No difficulties   Cognition Arousal/Alertness: Awake/alert Behavior During Therapy: WFL for tasks assessed/performed Overall Cognitive Status: Impaired/Different from baseline Area of Impairment: Safety/judgement     Safety/Judgement: Decreased awareness of safety;Decreased awareness of deficits     General Comments: Pt with several episodes of loss of balance during dynamic balance activities; reports no change from baseline, stating it is due to wearing grip socks vs shoes   General Comments  S - min guard for dynamic standing balance            Home Living Family/patient expects to be discharged to:: Private residence Living Arrangements: Spouse/significant other Available Help at Discharge: Family;Available 24 hours/day Type of Home: House Home Access: Stairs to  enter Entrance Stairs-Number of Steps: 3   Home Layout: One level     Bathroom Shower/Tub: Teacher, early years/pre:  Standard Bathroom Accessibility: Yes How Accessible: Accessible via walker Home Equipment: [place] - 2 wheels;Bedside commode;Shower seat          Prior Functioning/Environment Level of Independence: Independent        Comments: Retired Theme park manager        OT Problem List: Decreased strength;Pain;Decreased activity tolerance;Decreased safety awareness;Impaired balance (sitting and/or standing)      OT Treatment/Interventions: Self-care/ADL training;Therapeutic activities;Therapeutic exercise;Neuromuscular education;Energy conservation;DME and/or AE instruction;Patient/family education;Balance training;Modalities    OT Goals(Current goals can be found in the care plan section) Acute Rehab OT Goals Patient Stated Goal: "go home today." OT Goal Formulation: With patient Time For Goal Achievement: 08/17/19 Potential to Achieve Goals: Good ADL Goals Pt Will Perform Lower Body Bathing: with modified independence Pt Will Perform Lower Body Dressing: with modified independence Pt Will Transfer to Toilet: with modified independence Pt Will Perform Toileting - Clothing Manipulation and hygiene: with modified independence Pt Will Perform Tub/Shower Transfer: with supervision  OT Frequency: Min 2X/week   Barriers to D/C: Other (comment)  none known at this time          AM-PAC OT "6 Clicks" Daily Activity     Outcome Measure Help from another person eating meals?: None Help from another person taking care of personal grooming?: A Little Help from another person toileting, which includes using toliet, bedpan, or urinal?: A Little Help from another person bathing (including washing, rinsing, drying)?: A Little Help from another person to put on and taking off regular upper body clothing?: None Help from another person to put on and taking off regular lower body clothing?: A Little 6 Click Score: 20   End of Session Nurse Communication: Mobility status  Activity Tolerance:  Patient tolerated treatment well Patient left: in chair;with call bell/phone within reach;with chair alarm set  OT Visit Diagnosis: Unsteadiness on feet (R26.81);Muscle weakness (generalized) (M62.81)                Time: [id number] OT Time Calculation (min): 10 min Charges:  OT General Charges $OT Visit: 1 Visit OT Evaluation $OT Eval Low Complexity: 1 Low  Dailen Mcclish P MS, OTR/L 08/03/2019, 12:07 PM

## 2019-08-03 NOTE — Progress Notes (Signed)
Progress Note    [name]  GXQ:[id number] DOB: May 05, 1951  DOA: 08/02/2019 PCP: Pleas Koch, NP    Brief Narrative:   Chief complaint: Left-sided weakness  Medical records reviewed and are as summarized below:  [name] is an 69 y.o. female past medical history that includes hyperlipidemia, hypertension, tobacco use, Chiari I malformation s/p decompression presented to the emergency department February 16 chief complaint left-sided weakness and difficulty swallowing.  Work-up in the emergency department included MRI of the brain revealing acute ischemic infarction in the right basal ganglia without hemorrhage or mass-effect.  Assessment/Plan:   Principal Problem:   Acute ischemic stroke (HCC) Active Problems:   Hypokalemia   TOBACCO ABUSE   Essential hypertension   CKD (chronic kidney disease) stage 3, GFR 30-59 ml/min   Left bundle branch block   #1.  Acute ischemic stroke.  CT of the head notable for age indeterminate lacunar infarction in the right lentiform nucleus/external capsule, small foci of ill-defined hypoattenuation within the right subcapsular white matter, chronic lacunar infarction and Chiari I malformation with posterior fossa decompression, MRI of the brain notable for acute ischemic infarction in the right basal ganglia without hemorrhage or mass-effect, intracranial MRA was normal.  LDL 175, hemoglobin A1c 5.7. she reports symptoms resolved this morning.  Evaluated by speech therapy who recommend dysphagia 3 nectar thick liquids and modified barium swallow.  Evaluated by physical therapy who recommend no further therapy is needed. -Diet as noted above -Follow echo -Follow carotid Dopplers -Modified barium swallow as noted above -Continue aspirin has an intolerance to statin -Further recommendations per neurology  #2.  Chronic kidney disease stage III.  Creatinine 1.23 on admission which is up from last year a bit.  She was given 500 cc of  normal saline. -Hold nephrotoxins -Monitor urine output -Recheck in the morning  #3.  Hypokalemia.  Repleted and resolved  #4.  Left bundle branch block.  Per EKG.  Heart review indicates this appears to be new.  No chest pain shortness of breath.  No events on telemetry. -Follow echo -Outpatient follow-up as indicated  #5.  Tobacco use.  Cessation counseling offered   Family Communication/Anticipated D/C date and plan/Code Status   DVT prophylaxis: Lovenox ordered. Code Status: Full Code.  Family Communication: patient at bedside Disposition Plan: home   Medical Consultants:    neurology   Anti-Infectives:    None  Subjective:   Sitting in chair watching TV. Reports symptoms have resolved.   Objective:    Vitals:   08/03/19 0407 08/03/19 0611 08/03/19 0804 08/03/19 1121  BP: (!) 156/66 (!) 148/74 (!) 151/68 (!) 143/64  Pulse: (!) 54 (!) 51 62 (!) 51  Resp: 18 20 16 16   Temp: 98.3 F (36.8 C) 98.2 F (36.8 C) 98.1 F (36.7 C) 98.4 F (36.9 C)  TempSrc: Oral Oral Oral Oral  SpO2: 93% 93% 94% 96%  Weight:      Height:        Intake/Output Summary (Last 24 hours) at 08/03/2019 1246 Last data filed at 08/03/2019 1448 Gross per 24 hour  Intake 680 ml  Output --  Net 680 ml   Filed Weights   08/03/19 0053  Weight: 72.2 kg    Exam: General: Well-nourished alert, cooperative no acute distress CV: Regular rate and rhythm no murmur gallop or rub no lower extremity edema Respiratory: No increased work of breathing breath sounds are clear bilaterally to auscultation.  I hear no wheeze  no rhonchi no crackles Abdomen: Nondistended soft positive bowel sounds throughout nontender to palpation no guarding or rebounding Musculoskeletal: Joints without swelling/erythema full range of motion.  Ambulating in room with steady gait Neuro: Alert and oriented x3 speech clear facial symmetry tongue midline cranial nerves II through XII grossly intact  Data Reviewed:    I have personally reviewed following labs and imaging studies:  Labs: Labs show the following:   Basic Metabolic Panel: Recent Labs  Lab 08/02/19 1328 08/02/19 1328 08/02/19 2225 08/03/19 0249 08/03/19 1000  NA 141  --  143  --  143  K 3.3*   < > 3.2*  --  3.9  CL 107  --  105  --  107  CO2 24  --   --   --  25  GLUCOSE 90  --  104*  --  103*  BUN 9  --  10  --  9  CREATININE 1.23*  --  1.00  --  1.04*  CALCIUM 9.1  --   --   --  8.8*  MG  --   --   --  1.9  --    < > = values in this interval not displayed.   GFR Estimated Creatinine Clearance: 49.7 mL/min (A) (by C-G formula based on SCr of 1.04 mg/dL (H)). Liver Function Tests: No results for input(s): AST, ALT, ALKPHOS, BILITOT, PROT, ALBUMIN in the last 168 hours. No results for input(s): LIPASE, AMYLASE in the last 168 hours. No results for input(s): AMMONIA in the last 168 hours. Coagulation profile Recent Labs  Lab 08/02/19 2030  INR 1.0    CBC: Recent Labs  Lab 08/02/19 1328 08/02/19 2030 08/02/19 2225 08/03/19 1000  WBC 7.5 7.0  --  6.6  NEUTROABS  --  4.2  --  4.3  HGB 15.0 14.1 13.9 14.3  HCT 45.4 42.6 41.0 43.3  MCV 91.2 88.9  --  91.2  PLT 255 242  --  251   Cardiac Enzymes: No results for input(s): CKTOTAL, CKMB, CKMBINDEX, TROPONINI in the last 168 hours. BNP (last 3 results) No results for input(s): PROBNP in the last 8760 hours. CBG: No results for input(s): GLUCAP in the last 168 hours. D-Dimer: No results for input(s): DDIMER in the last 72 hours. Hgb A1c: Recent Labs    08/03/19 0249  HGBA1C 5.7*   Lipid Profile: Recent Labs    08/03/19 0249  CHOL 232*  HDL 31*  LDLCALC 175*  TRIG 128  CHOLHDL 7.5   Thyroid function studies: No results for input(s): TSH, T4TOTAL, T3FREE, THYROIDAB in the last 72 hours.  Invalid input(s): FREET3 Anemia work up: No results for input(s): VITAMINB12, FOLATE, FERRITIN, TIBC, IRON, RETICCTPCT in the last 72 hours. Sepsis Labs: Recent  Labs  Lab 08/02/19 1328 08/02/19 2030 08/03/19 1000  WBC 7.5 7.0 6.6    Microbiology Recent Results (from the past 240 hour(s))  SARS CORONAVIRUS 2 (TAT 6-24 HRS) Nasopharyngeal Nasopharyngeal Swab     Status: None   Collection Time: 08/02/19  7:31 PM   Specimen: Nasopharyngeal Swab  Result Value Ref Range Status   SARS Coronavirus 2 NEGATIVE NEGATIVE Final    Comment: (NOTE) SARS-CoV-2 target nucleic acids are NOT DETECTED. The SARS-CoV-2 RNA is generally detectable in upper and lower respiratory specimens during the acute phase of infection. Negative results do not preclude SARS-CoV-2 infection, do not rule out co-infections with other pathogens, and should not be used as the sole basis for treatment  or other patient management decisions. Negative results must be combined with clinical observations, patient history, and epidemiological information. The expected result is Negative. Fact Sheet for Patients: SugarRoll.be Fact Sheet for Healthcare Providers: https://www.woods-mathews.com/ This test is not yet approved or cleared by the [place] FDA and  has been authorized for detection and/or diagnosis of SARS-CoV-2 by FDA under an Emergency Use Authorization (EUA). This EUA will remain  in effect (meaning this test can be used) for the duration of the COVID-19 declaration under Section 56 4(b)(1) of the Act, 21 U.S.C. section 360bbb-3(b)(1), unless the authorization is terminated or revoked sooner. Performed at [facility], [place] [address], [place], [place] [address]     Procedures and diagnostic studies:  CT Head Wo Contrast  Result Date: 08/02/2019 CLINICAL DATA:  Transient ischemic attack (TIA). Maxillofacial pain. Additional history provided: Patient presents for left-sided facial pressure and dizziness, pressure feeling radiates to left side of face and to ear. EXAM: CT HEAD WITHOUT CONTRAST CT MAXILLOFACIAL WITHOUT  CONTRAST TECHNIQUE: Contiguous axial images were obtained from the base of the skull through the vertex without intravenous contrast. Multidetector CT imaging of the maxillofacial structures was performed. Multiplanar CT image reconstructions were also generated. A small metallic BB was placed on the right temple in order to reliably differentiate right from left. COMPARISON:  No pertinent prior studies available for comparison. FINDINGS: CT HEAD FINDINGS Brain: Streak artifact limits evaluation of the inferomedial cerebellum. There is no evidence of acute intracranial hemorrhage. No demarcated cortical infarction. No evidence of intracranial mass. No midline shift or extra-axial fluid collection. There is a small region of ill-defined hypodensity the right frontal lobe white matter (series 4, images 20-24). Age-indeterminate lacunar infarct within the right lentiform nucleus/external capsule (series 4, images 16 and 17). Small chronic lacunar infarct within the right caudate. 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 Mild generalized parenchymal atrophy. Vascular: No hyperdense vessel.  Atherosclerotic calcifications Skull: Sequela of prior posterior fossa decompression, including a postsurgical defect within the C1 posterior arch. No calvarial fracture or aggressive osseous lesion. CT MAXILLOFACIAL FINDINGS Osseous: No maxillofacial fracture. Small bony exostosis along the superficial aspect of the posterior right mandibular body (series 5, image 29). Orbits: No abnormality identified. Sinuses: No significant paranasal sinus disease. Soft tissues: Carotid artery calcified plaque. The visualized maxillofacial and upper neck soft tissues are otherwise unremarkable. Streak artifact from dental restoration significant limits evaluation of the oral cavity and also somewhat limits evaluation of the oropharynx. Within this limitation, there is no appreciable mass or swelling within the oral cavity,  pharynx or larynx. Other: The temporomandibular joints are unremarkable. Left mastoid effusion. IMPRESSION: CT head: 1. Age-indeterminate lacunar infarct within the right lentiform nucleus/external capsule. 2. Small focus of ill-defined hypoattenuation within the right frontal lobe subcortical white matter which may reflect chronic ischemic change. A recent white matter infarct at this site cannot be excluded. 3. Chronic lacunar infarct within the right caudate. 4. 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 5. Mild generalized parenchymal atrophy. CT maxillofacial: 1. Streak artifact from dental restoration limits evaluation of the oral cavity and oropharynx. Within this limitation, no swelling or discrete mass is appreciated within the oral cavity or pharynx. 2. Small left mastoid effusion. Electronically Signed   By: Kellie Simmering DO   On: 08/02/2019 18:50   MR ANGIO HEAD WO CONTRAST  Result Date: 08/02/2019 CLINICAL DATA:  Left-sided facial numbness EXAM: MRI HEAD WITHOUT CONTRAST MRA HEAD WITHOUT CONTRAST TECHNIQUE: Multiplanar,  multiecho pulse sequences of the brain and surrounding structures were obtained without intravenous contrast. Angiographic images of the head were obtained using MRA technique without contrast. COMPARISON:  08/02/2019 FINDINGS: MRI HEAD FINDINGS Brain: There is an area of abnormal diffusion restriction within the right basal ganglia. Multifocal white matter hyperintensity, most commonly due to chronic ischemic microangiopathy. Normal volume of CSF spaces. No chronic microhemorrhage. Normal midline structures. Vascular: Normal flow voids. Skull and upper cervical spine: Normal marrow signal. Sinuses/Orbits: Small amount of left mastoid fluid. Sinuses are clear. Normal orbits. Other: None MRA HEAD FINDINGS POSTERIOR CIRCULATION: --Vertebral arteries: Normal V4 segments. --Posterior inferior cerebellar arteries (PICA): Patent origins from the vertebral arteries.  --Anterior inferior cerebellar arteries (AICA): Patent origins from the basilar artery. --Basilar artery: Normal. --Superior cerebellar arteries: Normal. --Posterior cerebral arteries: Normal. Both originate from the basilar artery. Posterior communicating arteries (p-comm) are diminutive or absent. ANTERIOR CIRCULATION: --Intracranial internal carotid arteries: Normal. --Anterior cerebral arteries (ACA): Normal. Both A1 segments are present. Patent anterior communicating artery (a-comm). --Middle cerebral arteries (MCA): Normal. IMPRESSION: 1. 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 No hemorrhage or mass effect. 2. Normal intracranial MRA. Electronically Signed   By: Ulyses Jarred M.D.   On: 08/02/2019 22:02   MR BRAIN WO CONTRAST  Result Date: 08/02/2019 CLINICAL DATA:  Left-sided facial numbness EXAM: MRI HEAD WITHOUT CONTRAST MRA HEAD WITHOUT CONTRAST TECHNIQUE: Multiplanar, multiecho pulse sequences of the brain and surrounding structures were obtained without intravenous contrast. Angiographic images of the head were obtained using MRA technique without contrast. COMPARISON:  08/02/2019 FINDINGS: MRI HEAD FINDINGS Brain: There is an area of abnormal diffusion restriction within the right basal ganglia. Multifocal white matter hyperintensity, most commonly due to chronic ischemic microangiopathy. Normal volume of CSF spaces. No chronic microhemorrhage. Normal midline structures. Vascular: Normal flow voids. Skull and upper cervical spine: Normal marrow signal. Sinuses/Orbits: Small amount of left mastoid fluid. Sinuses are clear. Normal orbits. Other: None MRA HEAD FINDINGS POSTERIOR CIRCULATION: --Vertebral arteries: Normal V4 segments. --Posterior inferior cerebellar arteries (PICA): Patent origins from the vertebral arteries. --Anterior inferior cerebellar arteries (AICA): Patent origins from the basilar artery. --Basilar artery: Normal. --Superior cerebellar arteries: Normal. --Posterior  cerebral arteries: Normal. Both originate from the basilar artery. Posterior communicating arteries (p-comm) are diminutive or absent. ANTERIOR CIRCULATION: --Intracranial internal carotid arteries: Normal. --Anterior cerebral arteries (ACA): Normal. Both A1 segments are present. Patent anterior communicating artery (a-comm). --Middle cerebral arteries (MCA): Normal. IMPRESSION: 1. 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 No hemorrhage or mass effect. 2. Normal intracranial MRA. Electronically Signed   By: Ulyses Jarred M.D.   On: 08/02/2019 22:02   CT Maxillofacial Wo Contrast  Result Date: 08/02/2019 CLINICAL DATA:  Transient ischemic attack (TIA). Maxillofacial pain. Additional history provided: Patient presents for left-sided facial pressure and dizziness, pressure feeling radiates to left side of face and to ear. EXAM: CT HEAD WITHOUT CONTRAST CT MAXILLOFACIAL WITHOUT CONTRAST TECHNIQUE: Contiguous axial images were obtained from the base of the skull through the vertex without intravenous contrast. Multidetector CT imaging of the maxillofacial structures was performed. Multiplanar CT image reconstructions were also generated. A small metallic BB was placed on the right temple in order to reliably differentiate right from left. COMPARISON:  No pertinent prior studies available for comparison. FINDINGS: CT HEAD FINDINGS Brain: Streak artifact limits evaluation of the inferomedial cerebellum. There is no evidence of acute intracranial hemorrhage. No demarcated cortical infarction. No evidence of intracranial mass. No midline shift or extra-axial fluid  collection. There is a small region of ill-defined hypodensity the right frontal lobe white matter (series 4, images 20-24). Age-indeterminate lacunar infarct within the right lentiform nucleus/external capsule (series 4, images 16 and 17). Small chronic lacunar infarct within the right caudate. Chiari I malformation with prior posterior fossa  decompression. Mild generalized parenchymal atrophy. Vascular: No hyperdense vessel.  Atherosclerotic calcifications Skull: Sequela of prior posterior fossa decompression, including a postsurgical defect within the C1 posterior arch. No calvarial fracture or aggressive osseous lesion. CT MAXILLOFACIAL FINDINGS Osseous: No maxillofacial fracture. Small bony exostosis along the superficial aspect of the posterior right mandibular body (series 5, image 29). Orbits: No abnormality identified. Sinuses: No significant paranasal sinus disease. Soft tissues: Carotid artery calcified plaque. The visualized maxillofacial and upper neck soft tissues are otherwise unremarkable. Streak artifact from dental restoration significant limits evaluation of the oral cavity and also somewhat limits evaluation of the oropharynx. Within this limitation, there is no appreciable mass or swelling within the oral cavity, pharynx or larynx. Other: The temporomandibular joints are unremarkable. Left mastoid effusion. IMPRESSION: CT head: 1. Age-indeterminate lacunar infarct within the right lentiform nucleus/external capsule. 2. Small focus of ill-defined hypoattenuation within the right frontal lobe subcortical white matter which may reflect chronic ischemic change. A recent white matter infarct at this site cannot be excluded. 3. Chronic lacunar infarct within the right caudate. 4. 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 5. Mild generalized parenchymal atrophy. CT maxillofacial: 1. Streak artifact from dental restoration limits evaluation of the oral cavity and oropharynx. Within this limitation, no swelling or discrete mass is appreciated within the oral cavity or pharynx. 2. Small left mastoid effusion. Electronically Signed   By: Kellie Simmering DO   On: 08/02/2019 18:50    Medications:   . aspirin  300 mg Rectal Daily   Or  . aspirin  325 mg Oral Daily  . enoxaparin (LOVENOX) injection  40 mg Subcutaneous Q24H    Continuous Infusions:    LOS: 0 days   Radene Gunning NP  [facility]   How to contact the [facility] Attending or Consulting provider [place] or covering provider during after hours [place], for this patient?  1. Check the care team in [facility] and look for a) attending/consulting [facility] provider listed and b) the [facility] team listed 2. Log into [email or web address] and use [place]'s universal password to access. If you do not have the password, please contact the hospital operator. 3. Locate the [facility] provider you are looking for under [facility] and page to a number that you can be directly reached. 4. If you still have difficulty reaching the provider, please page the [facility] (Director on Call) for the Hospitalists listed on amion for assistance.  08/03/2019, 12:46 PM

## 2019-08-03 NOTE — Evaluation (Addendum)
Physical Therapy Evaluation Patient Details Name: [name] MRN: [id number] DOB: 03-Aug-1950 Today's Date: 08/03/2019   History of Present Illness   [name] is a 69 y.o. female with medical history significant for anxiety, hypertension, and Chiari I malformation, now presenting to the emergency department with difficulty swallowing, dizziness, and left facial pressure. 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   Clinical Impression  Pt admitted with above. Prior to admission, pt lives with her husband and is independent. Denies history of falls. On PT evaluation, pt reporting no residual deficits. Ambulating hallway distances and negotiated steps with hands on assist required for balance challenge. Pt does display dynamic balance impairments and decreased awareness of safety/deficits. Scoring 15/24 on the Dynamic Gait Index, indicating she is at risk for falls. Will continue to follow acutely to address. Pt declining need for follow up PT.     Follow Up Recommendations No PT follow up;Supervision for mobility/OOB (pt would benefit from OPPT, but declining)    Equipment Recommendations  None recommended by PT    Recommendations for Other Services       Precautions / Restrictions Precautions Precautions: Fall Restrictions Weight Bearing Restrictions: No      Mobility  Bed Mobility Overal bed mobility: Independent                Transfers Overall transfer level: Independent Equipment used: None                Ambulation/Gait Ambulation/Gait assistance: Min Gaffer (Feet): 350 Feet Assistive device: None Gait Pattern/deviations: Step-through pattern;Decreased stride length Gait velocity: decreased   General Gait Details: Pt requiring supervision for negotiating level surfaces, min guard for high level balance activities due to 2-3 lateral LOB with cross over step strategy to correct  Stairs Stairs:  Yes Stairs assistance: Min guard;Min assist Stair Management: Two rails Number of Stairs: 4 General stair comments: MinA for stability on first trial, min guard for subsequent trial with cues for step by step pattern  Wheelchair Mobility    Modified Rankin (Stroke Patients Only) Modified Rankin (Stroke Patients Only) Pre-Morbid Rankin Score: No symptoms Modified Rankin: Moderately severe disability     Balance                                 Standardized Balance Assessment Standardized Balance Assessment : Dynamic Gait Index   Dynamic Gait Index Level Surface: Mild Impairment Change in Gait Speed: Mild Impairment Gait with Horizontal Head Turns: Mild Impairment Gait with Vertical Head Turns: Mild Impairment Gait and Pivot Turn: Normal Step Over Obstacle: Moderate Impairment Step Around Obstacles: Mild Impairment Steps: Moderate Impairment Total Score: 15       Pertinent Vitals/Pain Pain Assessment: No/denies pain    Home Living Family/patient expects to be discharged to:: Private residence Living Arrangements: Spouse/significant other Available Help at Discharge: Family;Available 24 hours/day Type of Home: House Home Access: Stairs to enter   [employer] of Steps: 3 Home Layout: One level Home Equipment: Walker - 2 wheels;Bedside commode      Prior Function Level of Independence: Independent         Comments: Retired hairdresser     Journalist, newspaper        Extremity/Trunk Assessment   Upper Extremity Assessment Upper Extremity Assessment: Defer to OT evaluation    Lower Extremity Assessment Lower Extremity Assessment: RLE deficits/detail;LLE deficits/detail RLE Deficits /  Details: Grossly 5/5  LLE Deficits / Details: Grossly 5/5 except hip flexion 4+/5       Communication   Communication: No difficulties  Cognition Arousal/Alertness: Awake/alert Behavior During Therapy: WFL for tasks assessed/performed Overall  Cognitive Status: Impaired/Different from baseline Area of Impairment: Safety/judgement                         Safety/Judgement: Decreased awareness of safety;Decreased awareness of deficits     General Comments: Pt with several episodes of loss of balance during dynamic balance activities; reports no change from baseline, stating it is due to wearing grip socks vs shoes      General Comments      Exercises     Assessment/Plan    PT Assessment Patient needs continued PT services  PT Problem List Decreased strength;Decreased activity tolerance;Decreased balance;Decreased mobility;Decreased safety awareness       PT Treatment Interventions Gait training;Stair training;Functional mobility training;Therapeutic activities;Therapeutic exercise;Balance training;Patient/family education    PT Goals (Current goals can be found in the Care Plan section)  Acute Rehab PT Goals Patient Stated Goal: "go home today." PT Goal Formulation: With patient Time For Goal Achievement: 08/17/19 Potential to Achieve Goals: Good    Frequency Min 4X/week   Barriers to discharge        Co-evaluation               AM-PAC PT "6 Clicks" Mobility  Outcome Measure Help needed turning from your back to your side while in a flat bed without using bedrails?: None Help needed moving from lying on your back to sitting on the side of a flat bed without using bedrails?: None Help needed moving to and from a bed to a chair (including a wheelchair)?: None Help needed standing up from a chair using your arms (e.g., wheelchair or bedside chair)?: None Help needed to walk in hospital room?: A Little Help needed climbing 3-5 steps with a railing? : A Little 6 Click Score: 22    End of Session Equipment Utilized During Treatment: Gait belt Activity Tolerance: Patient tolerated treatment well Patient left: in chair;with call bell/phone within reach;with chair alarm set Nurse Communication:  Mobility status PT Visit Diagnosis: Unsteadiness on feet (R26.81)    Time: 1594-5859 PT Time Calculation (min) (ACUTE ONLY): 18 min   Charges:   PT Evaluation $PT Eval Moderate Complexity: 1 Mod            Wyona Almas, PT, DPT Acute Rehabilitation Services Pager [phone] Office [phone]   Deno Etienne 08/03/2019, 9:41 AM

## 2019-08-03 NOTE — Evaluation (Addendum)
Speech Language Pathology Evaluation Patient Details Name: SHARNI NEGRON MRN: [id number] DOB: 1951-01-06 Today's Date: 08/03/2019 Time: 9892-1194 SLP Time Calculation (min) (ACUTE ONLY): 18 min  Problem List:  Patient Active Problem List   Diagnosis Date Noted  . Acute ischemic stroke ([place]) 08/02/2019  . Hypokalemia 08/02/2019  . Left bundle branch block 08/02/2019  . CKD (chronic kidney disease) stage 3, GFR 30-59 ml/min 12/13/2018  . Primary localized osteoarthritis of right knee 02/17/2017  . Primary osteoarthritis of right knee 02/17/2017  . Preventative health care 09/01/2016  . Vitamin B12 deficiency 09/10/2015  . Insomnia 07/16/2015  . GERD (gastroesophageal reflux disease) 03/26/2015  . Fatigue 10/02/2014  . Other malaise and fatigue 02/08/2014  . HLD (hyperlipidemia) 11/19/2011  . Vaginal dryness, menopausal 11/19/2011  . Adjustment disorder 01/08/2011  . TOBACCO ABUSE 12/19/2008  . Essential hypertension 12/19/2008   Past Medical History:  Past Medical History:  Diagnosis Date  . Allergy   . Anxiety   . Arnold-Chiari malformation ([place])   . Arthritis   . GERD (gastroesophageal reflux disease)   . H. pylori infection    3 years ago  . Hyperlipidemia   . Incontinence   . Syncope and collapse    Per pt, denies passing out  . Tobacco use disorder   . Unspecified essential hypertension    Past Surgical History:  Past Surgical History:  Procedure Laterality Date  . APPENDECTOMY    . ARNOLD CHIARI SURGERY     neurocranial surgery  . BLEPHAROPLASTY     Bil  . C-EYE SURGERY PROCEDURE    . CHOLECYSTECTOMY    . EYE SURGERY    . MASS EXCISION Right 12/27/2013   Procedure: MINOR EXCISION OF RIGHT THUMB MUCOID CYST, DEBRIDEMENT OF INTERPHALANGEAL JOINT;  Surgeon: Cammie Sickle, MD;  Location: [facility];  Service: Orthopedics;  Laterality: Right;  . mass on thumb  right  . TOTAL KNEE ARTHROPLASTY Right 02/17/2017  . TOTAL KNEE ARTHROPLASTY  Right 02/17/2017   Procedure: TOTAL KNEE ARTHROPLASTY;  Surgeon: Melrose Nakayama, MD;  Location: [facility];  Service: Orthopedics;  Laterality: Right;  . TUBAL LIGATION     HPI:  MELANI BRISBANE is a 69 y.o. female with medical history significant for anxiety, hypertension, and Chiari I malformation, presented to the emergency department with difficulty swallowing, dizziness, and left facial pressure on 08/02/19. 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    Assessment / Plan / Recommendation Clinical Impression   Patient received at bedside for cognitive-linguistic evaluation. She presents with suspected minimal cognitive-communication impairments in the areas of memory and higher-level executive functioning skills. Expressive and receptive language skills are [facility]. No dysarthria noted. Patient reports she is retired, drives and is independent with medication management, Doctor, hospital and household tasks. She lives with her spouse.  Patient assessed utilizing some portions of the COGNISTAT. See below for scoring details. Patient's score in the "mild impairment" range for the reasoning: judgment subsection. Her scores were [facility] for all other portions administered. However, she endorses feeling as though she is experiencing some difficulty with recall and states she occasionally "feels overwhelmed" when doing financial tasks. Patient will benefit from brief ST follow-up with a focus on higher-level cognitive tasks and follow up at the next venue of care.  COGNISTAT - all subtests are within the average range, except where otherwise specified Orientation: 12/12 Attention: 7/8 Comprehension: not given Repetition: 8/8 Naming: 7/8 Construction: not given Memory: 10/12  Calculation: 3/4 Similarities: 5/8 Judgment: 3/6 mild impairment      SLP Assessment  SLP Recommendation/Assessment: Patient needs continued Speech Lanaguage Pathology Services SLP Visit Diagnosis:  Cognitive communication deficit (R41.841)    Follow Up Recommendations  Outpatient SLP    Frequency and Duration min 1 x/week  2 weeks      SLP Evaluation Cognition  Overall Cognitive Status: Impaired/Different from baseline Arousal/Alertness: Awake/alert Orientation Level: Oriented X4 Memory: Impaired Executive Function: Reasoning Reasoning: Impaired Reasoning Impairment: Functional complex       Comprehension  Auditory Comprehension Overall Auditory Comprehension: Appears within functional limits for tasks assessed Visual Recognition/Discrimination Discrimination: Not tested Reading Comprehension Reading Status: Not tested    Expression Expression Primary Mode of Expression: Verbal Verbal Expression Overall Verbal Expression: Appears within functional limits for tasks assessed Written Expression Dominant Hand: Right Written Expression: Not tested   Oral / Motor  Oral Motor/Sensory Function Overall Oral Motor/Sensory Function: Within functional limits Motor Speech Overall Motor Speech: Appears within functional limits for tasks assessed   GO                    Shanee Batch 08/03/2019, 4:34 PM  Marina Goodell, M.Ed., [place] [facility] Acute Rehabilitation [phone]: Acute Rehab office [phone] - pager

## 2019-08-03 NOTE — Progress Notes (Signed)
Carotid artery duplex completed. Refer to "CV Proc" under chart review to view preliminary results.  08/03/2019 1:46 PM Kelby Aline., MHA, RVT, RDCS, RDMS

## 2019-08-03 NOTE — Plan of Care (Signed)

## 2019-08-03 NOTE — Progress Notes (Addendum)
Pt noted to have HR briefly 39 and depressed T wave per telemetry. Baltazar Najjar informed. Pt asymptomatic sleeping

## 2019-08-04 ENCOUNTER — Inpatient Hospital Stay (HOSPITAL_COMMUNITY): Payer: Medicare HMO

## 2019-08-04 ENCOUNTER — Inpatient Hospital Stay (HOSPITAL_COMMUNITY): Payer: Medicare HMO | Admitting: Registered Nurse

## 2019-08-04 ENCOUNTER — Encounter (HOSPITAL_COMMUNITY)
Admission: EM | Disposition: A | Discharge status: Home Health | Payer: Self-pay | Source: Home / Self Care | Attending: Internal Medicine

## 2019-08-04 DIAGNOSIS — I6389 Other cerebral infarction: Secondary | ICD-10-CM

## 2019-08-04 DIAGNOSIS — I5020 Unspecified systolic (congestive) heart failure: Secondary | ICD-10-CM

## 2019-08-04 DIAGNOSIS — I480 Paroxysmal atrial fibrillation: Secondary | ICD-10-CM

## 2019-08-04 DIAGNOSIS — I639 Cerebral infarction, unspecified: Secondary | ICD-10-CM

## 2019-08-04 DIAGNOSIS — I469 Cardiac arrest, cause unspecified: Secondary | ICD-10-CM

## 2019-08-04 HISTORY — PX: LEFT HEART CATH AND CORONARY ANGIOGRAPHY: CATH118249

## 2019-08-04 LAB — LACTIC ACID, PLASMA
Lactic Acid, Venous: 7.3 mmol/L (ref 0.5–1.9)
Lactic Acid, Venous: 2.7 mmol/L (ref 0.5–1.9)

## 2019-08-04 LAB — CBC
HCT: 41.9 % (ref 36.0–46.0)
HCT: 45.4 % (ref 36.0–46.0)
Hemoglobin: 13.7 g/dL (ref 12.0–15.0)
Hemoglobin: 14.6 g/dL (ref 12.0–15.0)
MCH: 29.6 pg (ref 26.0–34.0)
MCH: 29.8 pg (ref 26.0–34.0)
MCHC: 32.7 g/dL (ref 30.0–36.0)
MCHC: 32.2 g/dL (ref 30.0–36.0)
MCV: 90.5 fL (ref 80.0–100.0)
MCV: 92.7 fL (ref 80.0–100.0)
Platelets: 224 10*3/uL (ref 150–400)
Platelets: 254 10*3/uL (ref 150–400)
RBC: 4.63 MIL/uL (ref 3.87–5.11)
RBC: 4.9 MIL/uL (ref 3.87–5.11)
RDW: 13.2 % (ref 11.5–15.5)
RDW: 13.2 % (ref 11.5–15.5)
WBC: 6.9 10*3/uL (ref 4.0–10.5)
WBC: 23.2 10*3/uL — ABNORMAL HIGH (ref 4.0–10.5)
nRBC: 0 % (ref 0.0–0.2)
nRBC: 0 % (ref 0.0–0.2)

## 2019-08-04 LAB — COMPREHENSIVE METABOLIC PANEL
ALT: 101 U/L — ABNORMAL HIGH (ref 0–44)
AST: 183 U/L — ABNORMAL HIGH (ref 15–41)
Albumin: 3.5 g/dL (ref 3.5–5.0)
Alkaline Phosphatase: 77 U/L (ref 38–126)
Anion gap: 16 — ABNORMAL HIGH (ref 5–15)
BUN: 11 mg/dL (ref 8–23)
CO2: 17 mmol/L — ABNORMAL LOW (ref 22–32)
Calcium: 8.6 mg/dL — ABNORMAL LOW (ref 8.9–10.3)
Chloride: 110 mmol/L (ref 98–111)
Creatinine, Ser: 1.34 mg/dL — ABNORMAL HIGH (ref 0.44–1.00)
GFR calc Af Amer: 47 mL/min — ABNORMAL LOW (ref >60)
GFR calc non Af Amer: 40 mL/min — ABNORMAL LOW (ref >60)
Glucose, Bld: 226 mg/dL — ABNORMAL HIGH (ref 70–99)
Potassium: 3.2 mmol/L — ABNORMAL LOW (ref 3.5–5.1)
Sodium: 143 mmol/L (ref 135–145)
Total Bilirubin: 0.9 mg/dL (ref 0.3–1.2)
Total Protein: 6.2 g/dL — ABNORMAL LOW (ref 6.5–8.1)

## 2019-08-04 LAB — ECHOCARDIOGRAM LIMITED BUBBLE STUDY
Height: 64 in
Weight: 2543.23 oz

## 2019-08-04 LAB — GLUCOSE, CAPILLARY
Glucose-Capillary: 108 mg/dL — ABNORMAL HIGH (ref 70–99)
Glucose-Capillary: 126 mg/dL — ABNORMAL HIGH (ref 70–99)
Glucose-Capillary: 127 mg/dL — ABNORMAL HIGH (ref 70–99)
Glucose-Capillary: 129 mg/dL — ABNORMAL HIGH (ref 70–99)
Glucose-Capillary: 145 mg/dL — ABNORMAL HIGH (ref 70–99)
Glucose-Capillary: 209 mg/dL — ABNORMAL HIGH (ref 70–99)
Glucose-Capillary: 76 mg/dL (ref 70–99)

## 2019-08-04 LAB — BASIC METABOLIC PANEL
Anion gap: 10 (ref 5–15)
BUN: 10 mg/dL (ref 8–23)
CO2: 26 mmol/L (ref 22–32)
Calcium: 9.1 mg/dL (ref 8.9–10.3)
Chloride: 107 mmol/L (ref 98–111)
Creatinine, Ser: 1.1 mg/dL — ABNORMAL HIGH (ref 0.44–1.00)
GFR calc Af Amer: 59 mL/min — ABNORMAL LOW (ref >60)
GFR calc non Af Amer: 51 mL/min — ABNORMAL LOW (ref >60)
Glucose, Bld: 95 mg/dL (ref 70–99)
Potassium: 3.9 mmol/L (ref 3.5–5.1)
Sodium: 143 mmol/L (ref 135–145)

## 2019-08-04 LAB — TROPONIN I (HIGH SENSITIVITY)
Troponin I (High Sensitivity): 53 ng/L — ABNORMAL HIGH (ref <18)
Troponin I (High Sensitivity): 457 ng/L (ref <18)

## 2019-08-04 LAB — MAGNESIUM: Magnesium: 3.5 mg/dL — ABNORMAL HIGH (ref 1.7–2.4)

## 2019-08-04 LAB — PHOSPHORUS: Phosphorus: 5 mg/dL — ABNORMAL HIGH (ref 2.5–4.6)

## 2019-08-04 LAB — PROTIME-INR
INR: 1.1 (ref 0.8–1.2)
Prothrombin Time: 14.1 seconds (ref 11.4–15.2)

## 2019-08-04 SURGERY — LEFT HEART CATH AND CORONARY ANGIOGRAPHY
Anesthesia: LOCAL

## 2019-08-04 MED ORDER — ORAL CARE MOUTH RINSE
15.0000 mL | Freq: Two times a day (BID) | OROMUCOSAL | Status: DC
  Administered 2019-08-05 – 2019-08-06 (×2): 15 mL via OROMUCOSAL

## 2019-08-04 MED ORDER — EZETIMIBE 10 MG PO TABS
10.0000 mg | ORAL_TABLET | Freq: Every day | ORAL | Status: DC
  Administered 2019-08-04 – 2019-08-07 (×4): 10 mg
  Filled 2019-08-04 (×4): qty 1

## 2019-08-04 MED ORDER — MIDAZOLAM HCL 2 MG/2ML IJ SOLN
INTRAMUSCULAR | Status: AC
  Filled 2019-08-04: qty 2

## 2019-08-04 MED ORDER — VERAPAMIL HCL 2.5 MG/ML IV SOLN
INTRAVENOUS | Status: AC
  Filled 2019-08-04: qty 2

## 2019-08-04 MED ORDER — SODIUM CHLORIDE 0.9% FLUSH
3.0000 mL | Freq: Two times a day (BID) | INTRAVENOUS | Status: DC
  Administered 2019-08-05 – 2019-08-06 (×4): 3 mL via INTRAVENOUS

## 2019-08-04 MED ORDER — HEPARIN SODIUM (PORCINE) 1000 UNIT/ML IJ SOLN
INTRAMUSCULAR | Status: DC | PRN
  Administered 2019-08-04: 4000 [IU] via INTRAVENOUS

## 2019-08-04 MED ORDER — PERFLUTREN LIPID MICROSPHERE
1.0000 mL | INTRAVENOUS | Status: AC | PRN
  Administered 2019-08-04: 2 mL via INTRAVENOUS
  Filled 2019-08-04: qty 10

## 2019-08-04 MED ORDER — FENTANYL CITRATE (PF) 100 MCG/2ML IJ SOLN
50.0000 ug | INTRAMUSCULAR | Status: DC | PRN
  Administered 2019-08-06: 50 ug via INTRAVENOUS
  Filled 2019-08-04: qty 2

## 2019-08-04 MED ORDER — MIDAZOLAM HCL 2 MG/2ML IJ SOLN
INTRAMUSCULAR | Status: DC | PRN
  Administered 2019-08-04: 0.5 mg via INTRAVENOUS

## 2019-08-04 MED ORDER — INSULIN ASPART 100 UNIT/ML ~~LOC~~ SOLN
0.0000 [IU] | SUBCUTANEOUS | Status: DC
  Administered 2019-08-04: 3 [IU] via SUBCUTANEOUS
  Administered 2019-08-04: 1 [IU] via SUBCUTANEOUS

## 2019-08-04 MED ORDER — SODIUM CHLORIDE 0.9% FLUSH: 3.0000 mL | INTRAVENOUS | Status: DC | PRN

## 2019-08-04 MED ORDER — HEPARIN SODIUM (PORCINE) 1000 UNIT/ML IJ SOLN
INTRAMUSCULAR | Status: AC
  Filled 2019-08-04: qty 1

## 2019-08-04 MED ORDER — HEPARIN (PORCINE) IN NACL 1000-0.9 UT/500ML-% IV SOLN
INTRAVENOUS | Status: AC
  Filled 2019-08-04: qty 1000

## 2019-08-04 MED ORDER — ASPIRIN EC 81 MG PO TBEC: 81.0000 mg | DELAYED_RELEASE_TABLET | Freq: Every day | ORAL | Status: DC

## 2019-08-04 MED ORDER — CHLORHEXIDINE GLUCONATE 0.12 % MT SOLN: 15.0000 mL | Freq: Two times a day (BID) | OROMUCOSAL | Status: DC

## 2019-08-04 MED ORDER — SODIUM CHLORIDE 0.9 % IV SOLN
INTRAVENOUS | Status: AC | PRN
  Administered 2019-08-04: 10 mL/h via INTRAVENOUS

## 2019-08-04 MED ORDER — CHLORHEXIDINE GLUCONATE CLOTH 2 % EX PADS
6.0000 | MEDICATED_PAD | Freq: Every day | CUTANEOUS | Status: DC
  Administered 2019-08-04: 6 via TOPICAL

## 2019-08-04 MED ORDER — PANTOPRAZOLE SODIUM 40 MG IV SOLR
40.0000 mg | Freq: Every day | INTRAVENOUS | Status: DC
  Administered 2019-08-04: 40 mg via INTRAVENOUS
  Filled 2019-08-04: qty 40

## 2019-08-04 MED ORDER — HEPARIN (PORCINE) IN NACL 1000-0.9 UT/500ML-% IV SOLN
INTRAVENOUS | Status: DC | PRN
  Administered 2019-08-04 (×2): 500 mL

## 2019-08-04 MED ORDER — HYDRALAZINE HCL 20 MG/ML IJ SOLN: 10.0000 mg | INTRAMUSCULAR | Status: AC | PRN

## 2019-08-04 MED ORDER — AMIODARONE IV BOLUS ONLY 150 MG/100ML
150.0000 mg | Freq: Once | INTRAVENOUS | Status: AC
  Administered 2019-08-04: 150 mg via INTRAVENOUS

## 2019-08-04 MED ORDER — ONDANSETRON HCL 4 MG/2ML IJ SOLN: 4.0000 mg | Freq: Four times a day (QID) | INTRAMUSCULAR | Status: DC | PRN

## 2019-08-04 MED ORDER — DEXMEDETOMIDINE HCL IN NACL 400 MCG/100ML IV SOLN
0.4000 ug/kg/h | INTRAVENOUS | Status: DC
  Administered 2019-08-04: 0.6 ug/kg/h via INTRAVENOUS
  Filled 2019-08-04: qty 100

## 2019-08-04 MED ORDER — APIXABAN 5 MG PO TABS
5.0000 mg | ORAL_TABLET | Freq: Two times a day (BID) | ORAL | Status: DC
  Administered 2019-08-04 – 2019-08-07 (×6): 5 mg via ORAL
  Filled 2019-08-04 (×6): qty 1

## 2019-08-04 MED ORDER — AMIODARONE HCL IN DEXTROSE 360-4.14 MG/200ML-% IV SOLN
60.0000 mg/h | INTRAVENOUS | Status: DC
  Administered 2019-08-04: 60 mg/h via INTRAVENOUS
  Filled 2019-08-04: qty 200

## 2019-08-04 MED ORDER — ASPIRIN 81 MG PO CHEW
81.0000 mg | CHEWABLE_TABLET | Freq: Every day | ORAL | Status: DC
  Administered 2019-08-04 – 2019-08-07 (×4): 81 mg
  Filled 2019-08-04 (×4): qty 1

## 2019-08-04 MED ORDER — LIDOCAINE HCL (PF) 1 % IJ SOLN
INTRAMUSCULAR | Status: AC
  Filled 2019-08-04: qty 30

## 2019-08-04 MED ORDER — ORAL CARE MOUTH RINSE
15.0000 mL | OROMUCOSAL | Status: DC
  Administered 2019-08-04: 15 mL via OROMUCOSAL

## 2019-08-04 MED ORDER — POTASSIUM CHLORIDE 20 MEQ/15ML (10%) PO SOLN
40.0000 meq | Freq: Once | ORAL | Status: AC
  Administered 2019-08-04: 40 meq
  Filled 2019-08-04: qty 30

## 2019-08-04 MED ORDER — CARVEDILOL 6.25 MG PO TABS
6.2500 mg | ORAL_TABLET | Freq: Two times a day (BID) | ORAL | Status: DC
  Administered 2019-08-04 – 2019-08-07 (×6): 6.25 mg via ORAL
  Filled 2019-08-04 (×8): qty 1

## 2019-08-04 MED ORDER — SODIUM CHLORIDE 0.9% FLUSH
10.0000 mL | Freq: Once | INTRAVENOUS | Status: AC
  Administered 2019-08-04: 10 mL via INTRAVENOUS

## 2019-08-04 MED ORDER — AMIODARONE HCL IN DEXTROSE 360-4.14 MG/200ML-% IV SOLN: 30.0000 mg/h | INTRAVENOUS | Status: DC

## 2019-08-04 MED ORDER — SODIUM CHLORIDE 0.9 % IV SOLN: INTRAVENOUS | Status: AC

## 2019-08-04 MED ORDER — SODIUM CHLORIDE 0.9 % IV SOLN: INTRAVENOUS | Status: DC

## 2019-08-04 MED ORDER — CHLORHEXIDINE GLUCONATE 0.12% ORAL RINSE (MEDLINE KIT)
15.0000 mL | Freq: Two times a day (BID) | OROMUCOSAL | Status: DC
  Administered 2019-08-04: 15 mL via OROMUCOSAL

## 2019-08-04 MED ORDER — SODIUM CHLORIDE 0.9 % IV SOLN
INTRAVENOUS | Status: AC | PRN
  Administered 2019-08-04: 75 mL/h via INTRAVENOUS

## 2019-08-04 MED ORDER — ATORVASTATIN CALCIUM 10 MG PO TABS
10.0000 mg | ORAL_TABLET | Freq: Every day | ORAL | Status: DC
  Administered 2019-08-05: 10 mg
  Filled 2019-08-04 (×3): qty 1

## 2019-08-04 MED ORDER — VERAPAMIL HCL 2.5 MG/ML IV SOLN
INTRAVENOUS | Status: DC | PRN
  Administered 2019-08-04: 10 mL via INTRA_ARTERIAL

## 2019-08-04 MED ORDER — CLOPIDOGREL BISULFATE 75 MG PO TABS
75.0000 mg | ORAL_TABLET | Freq: Every day | ORAL | Status: DC
  Administered 2019-08-04: 75 mg
  Filled 2019-08-04: qty 1

## 2019-08-04 MED ORDER — IOHEXOL 350 MG/ML SOLN
INTRAVENOUS | Status: DC | PRN
  Administered 2019-08-04: 35 mL

## 2019-08-04 MED ORDER — ASPIRIN 325 MG PO TABS
325.0000 mg | ORAL_TABLET | Freq: Once | ORAL | Status: DC
  Filled 2019-08-04: qty 1

## 2019-08-04 MED FILL — Medication: Qty: 1 | Status: AC

## 2019-08-04 SURGICAL SUPPLY — 10 items
CATH OPTITORQUE TIG 4.0 5F (CATHETERS) ×1 IMPLANT
DEVICE RAD COMP TR BAND LRG (VASCULAR PRODUCTS) ×1 IMPLANT
ELECT DEFIB PAD ADLT CADENCE (PAD) ×1 IMPLANT
GLIDESHEATH SLEND A-KIT 6F 22G (SHEATH) ×1 IMPLANT
GUIDEWIRE INQWIRE 1.5J.035X260 (WIRE) IMPLANT
INQWIRE 1.5J .035X260CM (WIRE) ×2
KIT HEART LEFT (KITS) ×2 IMPLANT
PACK CARDIAC CATHETERIZATION (CUSTOM PROCEDURE TRAY) ×2 IMPLANT
TRANSDUCER W/STOPCOCK (MISCELLANEOUS) ×2 IMPLANT
TUBING CIL FLEX 10 FLL-RA (TUBING) ×2 IMPLANT

## 2019-08-04 NOTE — Anesthesia Procedure Notes (Signed)
Procedure Name: Intubation Date/Time: 08/04/2019 6:19 AM Performed by: Jearld Pies, CRNA Pre-anesthesia Checklist: Patient identified, Emergency Drugs available, Suction available and Patient being monitored Patient Re-evaluated:Patient Re-evaluated prior to induction Oxygen Delivery Method: Ambu bag Preoxygenation: Pre-oxygenation with 100% oxygen Induction Type: IV induction Ventilation: Mask ventilation without difficulty Laryngoscope Size: Glidescope and 3 Grade View: Grade I Tube type: Oral Tube size: 7.0 mm Number of attempts: 2 Airway Equipment and Method: Stylet and Video-laryngoscopy Placement Confirmation: ETT inserted through vocal cords under direct vision,  breath sounds checked- equal and bilateral and CO2 detector Secured at: 22 cm Tube secured with: Tape Dental Injury: Teeth and Oropharynx as per pre-operative assessment

## 2019-08-04 NOTE — Consult Note (Signed)
NAME:  [name], MRN:  [id number], DOB:  June 16, 1951, LOS: 1 ADMISSION DATE:  08/02/2019, CONSULTATION DATE:  08/03/19 REFERRING MD:  ?, CHIEF COMPLAINT:  Arrest   Brief History   70 year old woman who presented initially with basilar ganglia stroke had cardiac arrest on floor and transferred emergently to ICU.  History of present illness   69 year old woman who presented initially with basilar ganglia stroke had cardiac arrest on floor and transferred emergently to ICU.  This AM noted to be in vfib without pulse, shocked and went into torsades, given Mg, went into PEA.  Total CPR time around 10-15 mins.  Now awake on vent, moving R side spontaneously, not moving L for me.  Per chart review, L is 4+/5.  Past Medical History  Tobacco use HLD GERD Arthritis [facility] Events   2/17 admitted  Consults:  PCCM  Procedures:  2/18 6 AM CPR x 10 minutes  Significant Diagnostic Tests:  CT Head 08/02/19 IMPRESSION: CT head: 1. Age-indeterminate lacunar infarct within the right lentiform nucleus/external capsule. 2. Small focus of ill-defined hypoattenuation within the right frontal lobe subcortical white matter which may reflect chronic ischemic change. A recent white matter infarct at this site cannot be excluded. 3. Chronic lacunar infarct within the right caudate. 4. 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 5. Mild generalized parenchymal atrophy.  Echo  IMPRESSIONS  1. Severely depressed LVEF with global hypokinesis. No apical thrombus  visualized on this study, however would consider repeat limited echo with  contrast to exclude LV thrombus. Could also consider TEE for further  evaluation of cardiac embolism.  2. Left ventricular ejection fraction, by estimation, is 25 to 30%. The  left ventricle has severely decreased function. The left ventricle  demonstrates global hypokinesis. There is moderate concentric left    ventricular hypertrophy. Left ventricular  diastolic parameters are indeterminate. Elevated left ventricular  end-diastolic pressure.  3. Right ventricular systolic function is normal. The right ventricular  size is normal.  4. The mitral valve is normal in structure and function. Mild mitral  valve regurgitation.  5. The aortic valve is tricuspid. Aortic valve regurgitation is not  visualized. No aortic stenosis is present.  6. The inferior vena cava is dilated in size with <50% respiratory  variability, suggesting right atrial pressure of 15 mmHg.   Micro Data:  COVID neg  Antimicrobials:  None   Interim history/subjective:  Admitted  Objective   Blood pressure (!) 156/76, pulse 71, temperature 98.2 F (36.8 C), temperature source Axillary, resp. rate 19, height 5\' 4"  (1.626 m), weight 72.1 kg, SpO2 100 %.    Vent Mode: PRVC FiO2 (%):  [100 %] 100 % Set Rate:  [16 bmp] 16 bmp Vt Set:  [430 mL] 430 mL PEEP:  [5 cmH20] 5 cmH20 Plateau Pressure:  [17 cmH20] 17 cmH20  No intake or output data in the 24 hours ending 08/04/19 0720 Filed Weights   08/03/19 0053 08/04/19 0636  Weight: 72.2 kg 72.1 kg    Examination: GEN: no acute distress HEENT: ETT in place, some oral secretions likely from intubation CV: RRR, ext warm PULM: Scattered rhonci, otherwise clear GI: soft, hypoactive BS EXT: no edema NEURO: moves R purposefully, withdraws on L, tracking with eyes, strong cough/gag PSYCH: RASS 0 SKIN: no rashes   Resolved Hospital Problem list   N/A  Assessment & Plan:  1: Vfib Arrest- In setting of new cardiomyopathyawake now with purposeful  movement,  - Would hold off on cooling given purposeful movement - No STEMI on EKG, LBBB with all leads showing discordant ST-T - Trops/echo, repeat lactate/Mg/CMP/CBC - Maintain normal electrolytes - Cardiology consult - Further plans pending results of above  2: Right sided CVA- suspected embolic in origin - Limited  echo w/ bubble being performed now - Dysphagia was being followed by speech - Plans for DAPT x 3 weeks followed by aspirin alone - With Afib/RVR, do not think needs loop recorder - On statin, hx of myopathy on this in past  3: New cardiomyopathy - Unclear origin - May need LHC at some point  4: Afib/RVR - HD stable at present but borderline - Amiodarone ordered given this and recent Vfib  5: Respiratory failure in setting of cardiac arrest - VAP prevention bundle - Precedex/fentanyl for sedation  6: Hx tobacco abuse, anxiety, allergies, Arnold Chiari malformation, HLD, CKD  Best practice:  Diet: NPO Pain/Anxiety/Delirium protocol (if indicated): precedex, PRN fentanyl VAP protocol (if indicated): in place 2/18 DVT prophylaxis: lovenox GI prophylaxis: PPI Glucose control: SSI Mobility: BR Code Status: Full Family Communication: Will reach out Disposition:  ICU  Labs   CBC: Recent Labs  Lab 08/02/19 1328 08/02/19 1328 08/02/19 2030 08/02/19 2225 08/03/19 1000 08/04/19 0209 08/04/19 0652  WBC 7.5  --  7.0  --  6.6 6.9 23.2*  NEUTROABS  --   --  4.2  --  4.3  --   --   HGB 15.0   < > 14.1 13.9 14.3 13.7 14.6  HCT 45.4   < > 42.6 41.0 43.3 41.9 45.4  MCV 91.2  --  88.9  --  91.2 90.5 92.7  PLT 255  --  242  --  251 224 254   < > = values in this interval not displayed.    Basic Metabolic Panel: Recent Labs  Lab 08/02/19 1328 08/02/19 2225 08/03/19 0249 08/03/19 1000 08/04/19 0209  NA 141 143  --  143 143  K 3.3* 3.2*  --  3.9 3.9  CL 107 105  --  107 107  CO2 24  --   --  25 26  GLUCOSE 90 104*  --  103* 95  BUN 9 10  --  9 10  CREATININE 1.23* 1.00  --  1.04* 1.10*  CALCIUM 9.1  --   --  8.8* 9.1  MG  --   --  1.9  --   --    GFR: Estimated Creatinine Clearance: 47 mL/min (A) (by C-G formula based on SCr of 1.1 mg/dL (H)). Recent Labs  Lab 08/02/19 2030 08/03/19 1000 08/04/19 0209 08/04/19 0652  WBC 7.0 6.6 6.9 23.2*    Liver Function  Tests: No results for input(s): AST, ALT, ALKPHOS, BILITOT, PROT, ALBUMIN in the last 168 hours. No results for input(s): LIPASE, AMYLASE in the last 168 hours. No results for input(s): AMMONIA in the last 168 hours.  ABG    Component Value Date/Time   TCO2 27 08/02/2019 2225     Coagulation Profile: Recent Labs  Lab 08/02/19 2030  INR 1.0    Cardiac Enzymes: No results for input(s): CKTOTAL, CKMB, CKMBINDEX, TROPONINI in the last 168 hours.  HbA1C: Hgb A1c MFr Bld  Date/Time Value Ref Range Status  08/03/2019 02:49 AM 5.7 (H) 4.8 - 5.6 % Final    Comment:    (NOTE) Pre diabetes:          5.7%-6.4% Diabetes:              >  6.4% Glycemic control for   <7.0% adults with diabetes     CBG: Recent Labs  Lab 08/04/19 0611  GLUCAP 76    Review of Systems:   On vent, cannot assess  Past Medical History  She,  has a past medical history of Allergy, Anxiety, Arnold-Chiari malformation ([place]), Arthritis, GERD (gastroesophageal reflux disease), H. pylori infection, Hyperlipidemia, Incontinence, Syncope and collapse, Tobacco use disorder, and Unspecified essential hypertension.   Surgical History    Past Surgical History:  Procedure Laterality Date  . APPENDECTOMY    . ARNOLD CHIARI SURGERY     neurocranial surgery  . BLEPHAROPLASTY     Bil  . C-EYE SURGERY PROCEDURE    . CHOLECYSTECTOMY    . EYE SURGERY    . MASS EXCISION Right 12/27/2013   Procedure: MINOR EXCISION OF RIGHT THUMB MUCOID CYST, DEBRIDEMENT OF INTERPHALANGEAL JOINT;  Surgeon: Cammie Sickle, MD;  [facility];  Service: Orthopedics;  Laterality: Right;  . mass on thumb  right  . TOTAL KNEE ARTHROPLASTY Right 02/17/2017  . TOTAL KNEE ARTHROPLASTY Right 02/17/2017   Procedure: TOTAL KNEE ARTHROPLASTY;  Surgeon: Melrose Nakayama, MD;  Location: [facility];  Service: Orthopedics;  Laterality: Right;  . TUBAL LIGATION       Social History   reports that she has been smoking  cigarettes. She has a 50.00 pack-year smoking history. She has never used smokeless tobacco. She reports that she does not drink alcohol or use drugs.   Family History   Her family history includes Bone cancer in her mother; Cancer in her sister; Diabetes in her sister; Diverticulosis in her sister; Heart disease in her mother; Hypertension in her sister; Tuberculosis in her father. There is no history of Colon cancer, Esophageal cancer, Pancreatic cancer, Stomach cancer, Liver disease, Kidney disease, or Rectal cancer.   Allergies Allergies  Allergen Reactions  . Shellfish-Derived Products Anaphylaxis  . Ativan [Lorazepam] Other (See Comments)    Makes "skin crawl" and insomnia  . Buspar [Buspirone] Nausea Only    Sweating and dizzy  . Clarithromycin Swelling  . Codeine Nausea And Vomiting  . Doxycycline Other (See Comments)    Unknown  . Guaifenesin Other (See Comments)    Palpitations  . Oxycodone Nausea And Vomiting  . Prednisone Nausea And Vomiting and Other (See Comments)    Makes my heart race  . Statins Other (See Comments)    Muscle pain  . Sulfonamide Derivatives Nausea And Vomiting    Achiness  . Tetracycline Nausea Only  . Vicodin [Hydrocodone-Acetaminophen] Nausea And Vomiting  . Famotidine Rash  . Latex Rash  . Omeprazole Nausea Only    Cough, shortness of breath - patient doesn't remember  . Peanut-Containing Drug Products Itching and Rash  . Wellbutrin [Bupropion] Palpitations     Home Medications  Prior to Admission medications   Medication Sig Start Date End Date Taking? Authorizing Provider  acetaminophen (TYLENOL) 500 MG tablet Take 1,000 mg by mouth 2 (two) times daily as needed (pain).   Yes [provider]  amLODipine-benazepril (LOTREL) 5-20 MG capsule Take 1 capsule by mouth daily. For blood pressure. 12/13/18  Yes Pleas Koch, NP  docusate sodium (COLACE) 100 MG capsule Take 100 mg by mouth daily as needed for mild constipation.   Yes  [provider]  loratadine (CLARITIN) 10 MG tablet Take 10 mg by mouth daily as needed for allergies.    Yes [provider]  Critical care time: 40 minutes not including any separately billable procedures

## 2019-08-04 NOTE — Progress Notes (Signed)
NP was not paged until pt was in ICU that she had undergone ACLS/CPR due to arrest. Per RN, tele rang out stating pt was in VFib. By the time RN got to room, pt was pulseless and a code was called. NP called [facility] to let them know pt was in ICU.  KJKG, NP [facility]

## 2019-08-04 NOTE — Progress Notes (Signed)
I spoke with the husband, information given.  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 twice daily.  Will consider addition of Entresto/ARB prior to discharge.  Will consider TEE.  Adrian Prows, MD, [facility] 08/04/2019, 2:09 PM [facility]. [place] Office: [phone]

## 2019-08-04 NOTE — Progress Notes (Addendum)
At [place] telemetry notified care nurse that pt was in [place], pt last rounded on at 0500 noted to be resting. At [phone] care nurse entered room and noted pt cyanotic, unresponsive, gasping for air, and pulseless. CPR started at 0605 and chrg nurse informed via bed call bell (this action was taking to lessen break in CPR). At 0610 code blue team at bedside. Baltazar Najjar, NP notified as soon as care could. Pt transported to ICU. Pt's husband voicemail left on house phone number on file. Pt's daughter spoke with at 34 and updated about pt's status. Pt's daughter stated that she would contact her father to inform him d/t him probably being asleep.

## 2019-08-04 NOTE — Code Documentation (Addendum)
  Patient Name: [name]   MRN: [id number]   Date of Birth/ Sex: 14-Apr-1951 , female      Admission Date: 08/02/2019  Attending Provider: Eugenie Filler, MD  Primary Diagnosis: Acute ischemic stroke [facility])   Indication: Pt was in her usual state of health until this AM, when she was noted to be in VFib. Code blue was subsequently called. At the time of arrival on scene, ACLS protocol was underway.   Technical Description:  - CPR performance duration:  10 minutes  - Was defibrillation or cardioversion used? yes  - Was external pacer placed? No  - Was patient intubated pre/post CPR? Yes   Medications Administered: Y = Yes; Blank = No Amiodarone    Atropine    Calcium    Epinephrine  y  Lidocaine    Magnesium  y  Norepinephrine    Phenylephrine    Sodium bicarbonate    Vasopressin     Post CPR evaluation:  - Final Status - Was patient successfully resuscitated ? Yes - What is current rhythm? Sinus tachycardia - What is current hemodynamic status? Intubated  Miscellaneous Information:  - Labs sent, including: CBC, CMP, Lactate, Magnesium, Phosphorous, ABG, PT/INR, UA, Blood cultures  - Primary team notified?  Yes  - Family Notified? Yes  - Additional notes/ transfer status:  Pt transferred to 2M15 from [place], MD Internal Medicine, PGY1 Pager: [phone]  08/04/2019,6:54 AM

## 2019-08-04 NOTE — Progress Notes (Signed)
Patient coughing some with po intake despite having passed [place] swallow screen. Will stop feeding dinner and wait for ST follow up tomorrow. Po liquids tolerated without coughing.

## 2019-08-04 NOTE — H&P (View-Only) (Signed)
CARDIOLOGY CONSULT NOTE  Patient ID: [name] MRN: [id number] DOB/AGE: Apr 02, 1951 69 y.o.  Admit date: 08/02/2019 Referring Physician  Ina Homes, MD Primary Physician:  Pleas Koch, NP Reason for Consultation  Cardiac arrest  Patient ID: [name], female    DOB: 1951/05/15, 69 y.o.   MRN: [id number]  CC: Cardiac arrest  HPI:    [name]  is a 69 y.o. Caucasian female with history of hypertension, hyperlipidemia, tobacco use disorder, history of Arnold-Chiari malformation, presenting with left-sided facial numbness and tingling that started suddenly on Monday morning and also started having "shakes", EMS was activated and brought to the emergency room.  She was found to have chronic strokes from previous and also new right basal ganglia stroke.  She was admitted to the hospital for further evaluation and was also found to have left bundle branch block.  Echocardiogram revealed severe LV systolic dysfunction.  Patient had an episode of sustained VT/VF and patient became pulseless, underwent ACLS protocol and CPR and was defibrillated and intubated.  However she was also successfully extubated upon arrival to the intensive care unit.  I was consulted in view of VF arrest and cardiomyopathy.  She is awake and able to give a history herself.  States that her left-sided weakness is improving.  No headache or visual disturbances.  She complains of chest pain at the CPR site.  Feels fatigued and weak.  Husband present.  On further questioning she states that she has been having chest tightness when she mops the floor or she is vacuuming the house and this has been ongoing for several months.  She also has occasional leg cramps with activity.  She feels her legs are always cold.   Past Medical History:  Diagnosis Date  . Allergy   . Anxiety   . Arnold-Chiari malformation ([place])   . Arthritis   . GERD (gastroesophageal reflux disease)   . H. pylori infection    3 years ago   . Hyperlipidemia   . Incontinence   . Syncope and collapse    Per pt, denies passing out  . Tobacco use disorder   . Unspecified essential hypertension    Past Surgical History:  Procedure Laterality Date  . APPENDECTOMY    . ARNOLD CHIARI SURGERY     neurocranial surgery  . BLEPHAROPLASTY     Bil  . C-EYE SURGERY PROCEDURE    . CHOLECYSTECTOMY    . EYE SURGERY    . MASS EXCISION Right 12/27/2013   Procedure: MINOR EXCISION OF RIGHT THUMB MUCOID CYST, DEBRIDEMENT OF INTERPHALANGEAL JOINT;  Surgeon: Cammie Sickle, MD;  Location: [place];  Service: Orthopedics;  Laterality: Right;  . mass on thumb  right  . TOTAL KNEE ARTHROPLASTY Right 02/17/2017  . TOTAL KNEE ARTHROPLASTY Right 02/17/2017   Procedure: TOTAL KNEE ARTHROPLASTY;  Surgeon: Melrose Nakayama, MD;  Location: [facility];  Service: Orthopedics;  Laterality: Right;  . TUBAL LIGATION     Social History   Tobacco Use  . Smoking status: Current Every Day Smoker    Packs/day: 1.00    Years: 50.00    Pack years: 50.00    Types: Cigarettes  . Smokeless tobacco: Never Used  Substance Use Topics  . Alcohol use: No    Alcohol/week: 0.0 standard drinks    Family History  Problem Relation Age of Onset  . Bone cancer Mother   . Heart disease Mother   . Cancer Sister  metastatic; unknown primary  . Diverticulosis Sister   . Tuberculosis Father   . Diabetes Sister   . Hypertension Sister   . Colon cancer Neg Hx   . Esophageal cancer Neg Hx   . Pancreatic cancer Neg Hx   . Stomach cancer Neg Hx   . Liver disease Neg Hx   . Kidney disease Neg Hx   . Rectal cancer Neg Hx     ROS  Review of Systems  Cardiovascular: Positive for chest pain. Negative for dyspnea on exertion and leg swelling.  Gastrointestinal: Negative for melena.  Neurological: Positive for focal weakness (left arm and right facial).   Objective   Vitals with BMI 08/04/2019 08/04/2019 08/04/2019  Height - - -  Weight - - -   BMI - - -  Systolic 992 426 834  Diastolic 66 65 66  Pulse 63 63 66    Blood pressure 122/66, pulse 63, temperature 98.1 F (36.7 C), temperature source Oral, resp. rate 19, height 5\' 4"  (1.626 m), weight 72.1 kg, SpO2 97 %.    Physical Exam  Constitutional: She is oriented to person, place, and time.  Cardiovascular: Normal rate, regular rhythm, normal heart sounds and intact distal pulses. Exam reveals no gallop.  No murmur heard. Pulses:      Carotid pulses are 2+ on the right side and 2+ on the left side.      Femoral pulses are 1+ on the right side and 0 on the left side.      Popliteal pulses are 1+ on the right side and 0 on the left side.       Dorsalis pedis pulses are 0 on the right side and 0 on the left side.       Posterior tibial pulses are 0 on the right side and 0 on the left side.  No leg edema, no JVD.  Pulmonary/Chest: Effort normal and breath sounds normal.  Chest wall tenderness  Abdominal: Soft. Bowel sounds are normal.  Neurological: She is alert and oriented to person, place, and time.  Skin: Skin is warm and dry.   Laboratory examination:    Recent Labs    08/03/19 1000 08/04/19 0209 08/04/19 0652  NA 143 143 143  K 3.9 3.9 3.2*  CL 107 107 110  CO2 25 26 17*  GLUCOSE 103* 95 226*  BUN 9 10 11   CREATININE 1.04* 1.10* 1.34*  CALCIUM 8.8* 9.1 8.6*  GFRNONAA 55* 51* 40*  GFRAA >60 59* 47*   estimated creatinine clearance is 38.6 mL/min (A) (by C-G formula based on SCr of 1.34 mg/dL (H)).  CMP Latest Ref Rng & Units 08/04/2019 08/04/2019 08/03/2019  Glucose 70 - 99 mg/dL 226(H) 95 103(H)  BUN 8 - 23 mg/dL 11 10 9   Creatinine 0.44 - 1.00 mg/dL 1.34(H) 1.10(H) 1.04(H)  Sodium 135 - 145 mmol/L 143 143 143  Potassium 3.5 - 5.1 mmol/L 3.2(L) 3.9 3.9  Chloride 98 - 111 mmol/L 110 107 107  CO2 22 - 32 mmol/L 17(L) 26 25  Calcium 8.9 - 10.3 mg/dL 8.6(L) 9.1 8.8(L)  Total Protein 6.5 - 8.1 g/dL 6.2(L) - -  Total Bilirubin 0.3 - 1.2 mg/dL 0.9 - -   Alkaline Phos 38 - 126 U/L 77 - -  AST 15 - 41 U/L 183(H) - -  ALT 0 - 44 U/L 101(H) - -   CBC Latest Ref Rng & Units 08/04/2019 08/04/2019 08/03/2019  WBC 4.0 - 10.5 K/uL 23.2(H) 6.9 6.6  Hemoglobin 12.0 - 15.0 g/dL 14.6 13.7 14.3  Hematocrit 36.0 - 46.0 % 45.4 41.9 43.3  Platelets 150 - 400 K/uL 254 224 251   Lipid Panel     Component Value Date/Time   CHOL 232 (H) 08/03/2019 0249   TRIG 128 08/03/2019 0249   HDL 31 (L) 08/03/2019 0249   CHOLHDL 7.5 08/03/2019 0249   VLDL 26 08/03/2019 0249   LDLCALC 175 (H) 08/03/2019 0249   LDLDIRECT 193.0 09/02/2016 0839   HEMOGLOBIN A1C Lab Results  Component Value Date   HGBA1C 5.7 (H) 08/03/2019   MPG 116.89 08/03/2019   TSH No results for input(s): TSH in the last 8760 hours. BNP (last 3 results) No results for input(s): BNP in the last 8760 hours.  Medications and allergies   Allergies  Allergen Reactions  . Shellfish-Derived Products Anaphylaxis  . Ativan [Lorazepam] Other (See Comments)    Makes "skin crawl" and insomnia  . Buspar [Buspirone] Nausea Only    Sweating and dizzy  . Clarithromycin Swelling  . Codeine Nausea And Vomiting  . Doxycycline Other (See Comments)    Unknown  . Guaifenesin Other (See Comments)    Palpitations  . Oxycodone Nausea And Vomiting  . Prednisone Nausea And Vomiting and Other (See Comments)    Makes my heart race  . Statins Other (See Comments)    Muscle pain  . Sulfonamide Derivatives Nausea And Vomiting    Achiness  . Tetracycline Nausea Only  . Vicodin [Hydrocodone-Acetaminophen] Nausea And Vomiting  . Famotidine Rash  . Latex Rash  . Omeprazole Nausea Only    Cough, shortness of breath - patient doesn't remember  . Peanut-Containing Drug Products Itching and Rash  . Wellbutrin [Bupropion] Palpitations     Prior to Admission medications   Medication Sig Start Date End Date Taking? Authorizing Provider  acetaminophen (TYLENOL) 500 MG tablet Take 1,000 mg by mouth 2 (two)  times daily as needed (pain).   Yes [provider]  amLODipine-benazepril (LOTREL) 5-20 MG capsule Take 1 capsule by mouth daily. For blood pressure. 12/13/18  Yes Pleas Koch, NP  docusate sodium (COLACE) 100 MG capsule Take 100 mg by mouth daily as needed for mild constipation.   Yes [provider]  loratadine (CLARITIN) 10 MG tablet Take 10 mg by mouth daily as needed for allergies.    Yes [provider]    . sodium chloride    . amiodarone Stopped (08/04/19 0945)  . amiodarone    . dexmedetomidine (PRECEDEX) IV infusion Stopped (08/04/19 0263)    Current Outpatient Medications  Medication Instructions  . acetaminophen (TYLENOL) 1,000 mg, Oral, 2 times daily PRN  . amLODipine-benazepril (LOTREL) 5-20 MG capsule 1 capsule, Oral, Daily, For blood pressure.  . docusate sodium (COLACE) 100 mg, Oral, Daily PRN  . loratadine (CLARITIN) 10 mg, Oral, Daily PRN    I/O last 3 completed shifts: In: 680 [I.V.:180; IV Piggyback:500] Out: -  Total I/O In: 8.6 [I.V.:8.6] Out: -     Radiology:   CT of the head without contrast 08/04/2019: MPRESSION: 1. No acute finding when compared to MRI 2 days ago. 2. Recent right basal ganglia perforator infarct without hemorrhage or visible progression. Electronically Signed   By: Monte Fantasia M.D.   On: 08/04/2019 11:04   CT of the head with contrast 08/02/2019: : 1. Age-indeterminate lacunar infarct within the right lentiform nucleus/external capsule. 2. Small focus of ill-defined hypoattenuation within the right frontal lobe subcortical white matter which  may reflect chronic ischemic change. A recent white matter infarct at this site cannot be excluded. 3. Chronic lacunar infarct within the right caudate. 4. 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 5. Mild generalized parenchymal atrophy. CT maxillofacial: 1. Streak artifact from dental restoration limits evaluation of the oral cavity and  oropharynx. Within this limitation, no swelling or discrete mass is appreciated within the oral cavity or pharynx. 2. Small left mastoid effusion. Electronically Signed   By: Kellie Simmering DO   On: 08/02/2019 18:50   MRI of the brain 08/02/2019: POSTERIOR CIRCULATION: --Vertebral arteries: Normal V4 segments. --Posterior inferior cerebellar arteries (PICA): Patent origins from the vertebral arteries. --Anterior inferior cerebellar arteries (AICA): Patent origins from the basilar artery. --Basilar artery: Normal. --Superior cerebellar arteries: Normal. --Posterior cerebral arteries: Normal. Both originate from the basilar artery. Posterior communicating arteries (p-comm) are diminutive or absent. ANTERIOR CIRCULATION: --Intracranial internal carotid arteries: Normal. --Anterior cerebral arteries (ACA): Normal. Both A1 segments are present. Patent anterior communicating artery (a-comm). --Middle cerebral arteries (MCA): Normal. IMPRESSION: 1. 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 No hemorrhage or mass effect. 2. Normal intracranial MRA. Electronically Signed   By: Ulyses Jarred M.D.   On: 08/02/2019 22:02   Chest x-ray 08/04/2019:  Result Date: 08/04/2019 CLINICAL DATA:  Respiratory arrest EXAM: PORTABLE CHEST 1 VIEW COMPARISON:  02/10/2017 FINDINGS: Endotracheal tube with tip 10 mm above the carina. Symmetric low volume lungs with mild interstitial coarsening. There is no edema, consolidation, effusion, or pneumothorax. Normal heart size and mediastinal contours. IMPRESSION: 1. Endotracheal tube with tip 10 mm above the carina. 2. Symmetric low volume lungs. Electronically Signed   By: Monte Fantasia M.D.   On: 08/04/2019 07:11  Cardiac Studies:   Echocardiogram 08/04/2019: 1. Left ventricular ejection fraction, by estimation, is 25 to 30%. The  left ventricle has severely decreased function. The left ventricle  demonstrates global hypokinesis. There was marked septal-lateral  dyssynchrony  consistent with LBBB. No LV thrombus  noted on Definity study.  2. Bubble study appeared negative for shunt lesion but was a difficult  study due to poor windows.   Carotid artery duplex  08/03/2019: Right Carotid: Velocities in the right ICA are consistent with a 1-39%  stenosis.  Left Carotid: Velocities in the left ICA are consistent with a 1-39%  stenosis.  Vertebrals: Bilateral vertebral arteries demonstrate antegrade flow.  Subclavians: Normal flow hemodynamics were seen in bilateral subclavian  arteries.   Assessment   1.  VF cardiac arrest, successful restoration. 2.  New onset LBBB, echocardiogram suggesting dilated cardiomyopathy with severe LV systolic dysfunction.  EKG was normal in 2018. 3.  Multiple strokes, old and also recent stroke 2 days ago, management per neurology.  Discussed with Dr. Leonie Man who agrees that anticoagulation if patient needs PCI is not an issue. 4.  Hypertension controlled 5.  Hyperlipidemia 6.  Peripheral arterial disease, she has absent pedal pulses and also very faint right femoral pulse and absent left femoral pulse. 7.  Tobacco use disorder  Recommendations:   Patient is stable from medical standpoint to proceed with cardiac catheterization.  I discussed the risk benefits and alternatives with the patient and her husband at the bedside, patient is willing to proceed.  No contraindication for anticoagulation or antiplatelet therapy per neurology.  She will need aggressive risk factor modification for hypertension and hyperlipidemia.  She will eventually also need evaluation for peripheral arterial disease and smoking cessation counseling.  Adrian Prows, MD, [facility] 08/04/2019, 11:58  AM [facility] Pager: [phone] Office: [phone]

## 2019-08-04 NOTE — Progress Notes (Signed)
Pt had concern about taking STATINS d/t them "making me weak" pt stated earlier in shift. Pharmacy informed at time of pt concern. Care nurse Nate ICU informed. Initial dose not due until 08/04/2019 1000.

## 2019-08-04 NOTE — Progress Notes (Signed)
Echocardiogram 2D Echocardiogram has been performed.  Oneal Deputy Dashauna Heymann 08/04/2019, 9:00 AM

## 2019-08-04 NOTE — Progress Notes (Signed)
Very small hematoma next to TR band; faint bruising. Pressure held x5 minutes, hematoma resolved. Rt arm resting elevated on pillow.

## 2019-08-04 NOTE — Interval H&P Note (Signed)
History and Physical Interval Note:  08/04/2019 1:20 PM  [name]  has presented today for surgery, with the diagnosis of VF arrest.  The various methods of treatment have been discussed with the patient and family. After consideration of risks, benefits and other options for treatment, the patient has consented to  Procedure(s): LEFT HEART CATH AND CORONARY ANGIOGRAPHY (N/A) and possible PCI Cath Lab Visit (complete for each Cath Lab visit)  Clinical Evaluation Leading to the Procedure:   ACS: Yes.    Non-ACS:    Anginal Classification: CCS IV  Anti-ischemic medical therapy: No Therapy  Non-Invasive Test Results: No non-invasive testing performed  Prior CABG: No previous CABG  as a surgical intervention.  The patient's history has been reviewed, patient examined, no change in status, stable for surgery.  I have reviewed the patient's chart and labs.  Questions were answered to the patient's satisfaction.     Adrian Prows

## 2019-08-04 NOTE — Progress Notes (Signed)
STROKE TEAM PROGRESS NOTE   INTERVAL HISTORY Patient had an episode of sustained VT/VF  Early this morningand patient became pulseless, underwent ACLS protocol and CPR and was defibrillated and intubated.  However she was also successfully extubated upon arrival to the intensive care unit.  She is awake and interactive but does complain of some mild increase subjective left-sided weakness.  Her cardiac rhythm on the monitor has been A. fib intermittently. 2D echo yesterday shows significantly diminished ejection fraction of 25 to 30% with severely decreased left ventricular function with global hypokinesis. Vitals:   08/04/19 1100 08/04/19 1115 08/04/19 1200 08/04/19 1310  BP: 123/65 122/66    Pulse: 63 63    Resp: 19 19    Temp:   98.2 F (36.8 C)   TempSrc:   Oral   SpO2: 96% 97%  95%  Weight:      Height:        CBC:  Recent Labs  Lab 08/02/19 2030 08/02/19 2225 08/03/19 1000 08/03/19 1000 08/04/19 0209 08/04/19 0652  WBC 7.0   < > 6.6   < > 6.9 23.2*  NEUTROABS 4.2  --  4.3  --   --   --   HGB 14.1   < > 14.3   < > 13.7 14.6  HCT 42.6   < > 43.3   < > 41.9 45.4  MCV 88.9   < > 91.2   < > 90.5 92.7  PLT 242   < > 251   < > 224 254   < > = values in this interval not displayed.    Basic Metabolic Panel:  Recent Labs  Lab 08/03/19 0249 08/03/19 1000 08/04/19 0209 08/04/19 0652  NA  --    < > 143 143  K  --    < > 3.9 3.2*  CL  --    < > 107 110  CO2  --    < > 26 17*  GLUCOSE  --    < > 95 226*  BUN  --    < > 10 11  CREATININE  --    < > 1.10* 1.34*  CALCIUM  --    < > 9.1 8.6*  MG 1.9  --   --  3.5*  PHOS  --   --   --  5.0*   < > = values in this interval not displayed.   Lipid Panel:     Component Value Date/Time   CHOL 232 (H) 08/03/2019 0249   TRIG 128 08/03/2019 0249   HDL 31 (L) 08/03/2019 0249   CHOLHDL 7.5 08/03/2019 0249   VLDL 26 08/03/2019 0249   LDLCALC 175 (H) 08/03/2019 0249   HgbA1c:  Lab Results  Component Value Date   HGBA1C 5.7  (H) 08/03/2019   Urine Drug Screen:     Component Value Date/Time   LABOPIA NONE DETECTED 08/02/2019 2100   COCAINSCRNUR NONE DETECTED 08/02/2019 2100   LABBENZ NONE DETECTED 08/02/2019 2100   AMPHETMU NONE DETECTED 08/02/2019 2100   THCU NONE DETECTED 08/02/2019 2100   LABBARB NONE DETECTED 08/02/2019 2100    Alcohol Level     Component Value Date/Time   ETH <10 08/02/2019 2030    IMAGING past 48 hours DG Abd 1 View  Result Date: 08/04/2019 CLINICAL DATA:  OG tube placement EXAM: ABDOMEN - 1 VIEW COMPARISON:  None. FINDINGS: Enteric tube tip overlies the distal stomach. Endotracheal tube is now 3 cm above the carina.  Unremarkable bowel gas pattern. IMPRESSION: Enteric tube tip overlies distal stomach. Electronically Signed   By: Macy Mis M.D.   On: 08/04/2019 08:49   CT HEAD WO CONTRAST  Result Date: 08/04/2019 CLINICAL DATA:  Neuro deficit with stroke suspected EXAM: CT HEAD WITHOUT CONTRAST TECHNIQUE: Contiguous axial images were obtained from the base of the skull through the vertex without intravenous contrast. 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 No evidence of infarct progression or hemorrhagic transformation. Suboccipital craniectomy. No hydrocephalus or masslike finding. History of Chiari malformation. Vascular: No hyperdense vessel. Skull: Suboccipital craniectomy Sinuses/Orbits: Negative IMPRESSION: 1. No acute finding when compared to MRI 2 days ago. 2. Recent right basal ganglia perforator infarct without hemorrhage or visible progression. Electronically Signed   By: Monte Fantasia M.D.   On: 08/04/2019 11:04   CT Head Wo Contrast  Result Date: 08/02/2019 CLINICAL DATA:  Transient ischemic attack (TIA). Maxillofacial pain. Additional history provided: Patient presents for left-sided facial pressure and dizziness, pressure feeling radiates to left side of face and to ear. EXAM: CT HEAD  WITHOUT CONTRAST CT MAXILLOFACIAL WITHOUT CONTRAST TECHNIQUE: Contiguous axial images were obtained from the base of the skull through the vertex without intravenous contrast. Multidetector CT imaging of the maxillofacial structures was performed. Multiplanar CT image reconstructions were also generated. A small metallic BB was placed on the right temple in order to reliably differentiate right from left. COMPARISON:  No pertinent prior studies available for comparison. FINDINGS: CT HEAD FINDINGS Brain: Streak artifact limits evaluation of the inferomedial cerebellum. There is no evidence of acute intracranial hemorrhage. No demarcated cortical infarction. No evidence of intracranial mass. No midline shift or extra-axial fluid collection. There is a small region of ill-defined hypodensity the right frontal lobe white matter (series 4, images 20-24). Age-indeterminate lacunar infarct within the right lentiform nucleus/external capsule (series 4, images 16 and 17). Small chronic lacunar infarct within the right caudate. 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 Mild generalized parenchymal atrophy. Vascular: No hyperdense vessel.  Atherosclerotic calcifications Skull: Sequela of prior posterior fossa decompression, including a postsurgical defect within the C1 posterior arch. No calvarial fracture or aggressive osseous lesion. CT MAXILLOFACIAL FINDINGS Osseous: No maxillofacial fracture. Small bony exostosis along the superficial aspect of the posterior right mandibular body (series 5, image 29). Orbits: No abnormality identified. Sinuses: No significant paranasal sinus disease. Soft tissues: Carotid artery calcified plaque. The visualized maxillofacial and upper neck soft tissues are otherwise unremarkable. Streak artifact from dental restoration significant limits evaluation of the oral cavity and also somewhat limits evaluation of the oropharynx. Within this limitation, there is no appreciable  mass or swelling within the oral cavity, pharynx or larynx. Other: The temporomandibular joints are unremarkable. Left mastoid effusion. IMPRESSION: CT head: 1. Age-indeterminate lacunar infarct within the right lentiform nucleus/external capsule. 2. Small focus of ill-defined hypoattenuation within the right frontal lobe subcortical white matter which may reflect chronic ischemic change. A recent white matter infarct at this site cannot be excluded. 3. Chronic lacunar infarct within the right caudate. 4. 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 5. Mild generalized parenchymal atrophy. CT maxillofacial: 1. Streak artifact from dental restoration limits evaluation of the oral cavity and oropharynx. Within this limitation, no swelling or discrete mass is appreciated within the oral cavity or pharynx. 2. Small left mastoid effusion. Electronically Signed   By: Kellie Simmering DO   On: 08/02/2019 18:50  MR ANGIO HEAD WO CONTRAST  Result Date: 08/02/2019 CLINICAL DATA:  Left-sided facial numbness EXAM: MRI HEAD WITHOUT CONTRAST MRA HEAD WITHOUT CONTRAST TECHNIQUE: Multiplanar, multiecho pulse sequences of the brain and surrounding structures were obtained without intravenous contrast. Angiographic images of the head were obtained using MRA technique without contrast. COMPARISON:  08/02/2019 FINDINGS: MRI HEAD FINDINGS Brain: There is an area of abnormal diffusion restriction within the right basal ganglia. Multifocal white matter hyperintensity, most commonly due to chronic ischemic microangiopathy. Normal volume of CSF spaces. No chronic microhemorrhage. Normal midline structures. Vascular: Normal flow voids. Skull and upper cervical spine: Normal marrow signal. Sinuses/Orbits: Small amount of left mastoid fluid. Sinuses are clear. Normal orbits. Other: None MRA HEAD FINDINGS POSTERIOR CIRCULATION: --Vertebral arteries: Normal V4 segments. --Posterior inferior cerebellar arteries (PICA): Patent  origins from the vertebral arteries. --Anterior inferior cerebellar arteries (AICA): Patent origins from the basilar artery. --Basilar artery: Normal. --Superior cerebellar arteries: Normal. --Posterior cerebral arteries: Normal. Both originate from the basilar artery. Posterior communicating arteries (p-comm) are diminutive or absent. ANTERIOR CIRCULATION: --Intracranial internal carotid arteries: Normal. --Anterior cerebral arteries (ACA): Normal. Both A1 segments are present. Patent anterior communicating artery (a-comm). --Middle cerebral arteries (MCA): Normal. IMPRESSION: 1. 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 No hemorrhage or mass effect. 2. Normal intracranial MRA. Electronically Signed   By: Ulyses Jarred M.D.   On: 08/02/2019 22:02   MR BRAIN WO CONTRAST  Result Date: 08/02/2019 CLINICAL DATA:  Left-sided facial numbness EXAM: MRI HEAD WITHOUT CONTRAST MRA HEAD WITHOUT CONTRAST TECHNIQUE: Multiplanar, multiecho pulse sequences of the brain and surrounding structures were obtained without intravenous contrast. Angiographic images of the head were obtained using MRA technique without contrast. COMPARISON:  08/02/2019 FINDINGS: MRI HEAD FINDINGS Brain: There is an area of abnormal diffusion restriction within the right basal ganglia. Multifocal white matter hyperintensity, most commonly due to chronic ischemic microangiopathy. Normal volume of CSF spaces. No chronic microhemorrhage. Normal midline structures. Vascular: Normal flow voids. Skull and upper cervical spine: Normal marrow signal. Sinuses/Orbits: Small amount of left mastoid fluid. Sinuses are clear. Normal orbits. Other: None MRA HEAD FINDINGS POSTERIOR CIRCULATION: --Vertebral arteries: Normal V4 segments. --Posterior inferior cerebellar arteries (PICA): Patent origins from the vertebral arteries. --Anterior inferior cerebellar arteries (AICA): Patent origins from the basilar artery. --Basilar artery: Normal. --Superior  cerebellar arteries: Normal. --Posterior cerebral arteries: Normal. Both originate from the basilar artery. Posterior communicating arteries (p-comm) are diminutive or absent. ANTERIOR CIRCULATION: --Intracranial internal carotid arteries: Normal. --Anterior cerebral arteries (ACA): Normal. Both A1 segments are present. Patent anterior communicating artery (a-comm). --Middle cerebral arteries (MCA): Normal. IMPRESSION: 1. 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 No hemorrhage or mass effect. 2. Normal intracranial MRA. Electronically Signed   By: Ulyses Jarred M.D.   On: 08/02/2019 22:02   DG CHEST PORT 1 VIEW  Result Date: 08/04/2019 CLINICAL DATA:  Respiratory arrest EXAM: PORTABLE CHEST 1 VIEW COMPARISON:  02/10/2017 FINDINGS: Endotracheal tube with tip 10 mm above the carina. Symmetric low volume lungs with mild interstitial coarsening. There is no edema, consolidation, effusion, or pneumothorax. Normal heart size and mediastinal contours. IMPRESSION: 1. Endotracheal tube with tip 10 mm above the carina. 2. Symmetric low volume lungs. Electronically Signed   By: Monte Fantasia M.D.   On: 08/04/2019 07:11   ECHOCARDIOGRAM COMPLETE  Result Date: 08/03/2019    ECHOCARDIOGRAM REPORT   Patient Name:   [name] Date of Exam: 08/03/2019 Medical Rec #:  [id number]  Height:       64.0 in Accession #:    [id number]     Weight:       159.2 lb Date of Birth:  1951-02-13      BSA:          1.78 m Patient Age:    72 years       BP:           143/64 mmHg Patient Gender: F              HR:           53 bpm. Exam Location:  Inpatient Procedure: 2D Echo, Cardiac Doppler and Color Doppler Indications:    Stroke 434.91  History:        Patient has no prior history of Echocardiogram examinations.                 Stroke, Arrythmias:LBBB; Risk Factors:Hypertension, Dyslipidemia                 and Current Smoker. GERD.  Sonographer:    Vickie Epley RDCS Referring Phys: [id number] [place]  1.  Severely depressed LVEF with global hypokinesis. No apical thrombus visualized on this study, however would consider repeat limited echo with contrast to exclude LV thrombus. Could also consider TEE for further evaluation of cardiac embolism.  2. Left ventricular ejection fraction, by estimation, is 25 to 30%. The left ventricle has severely decreased function. The left ventricle demonstrates global hypokinesis. There is moderate concentric left ventricular hypertrophy. Left ventricular diastolic parameters are indeterminate. Elevated left ventricular end-diastolic pressure.  3. Right ventricular systolic function is normal. The right ventricular size is normal.  4. The mitral valve is normal in structure and function. Mild mitral valve regurgitation.  5. The aortic valve is tricuspid. Aortic valve regurgitation is not visualized. No aortic stenosis is present.  6. The inferior vena cava is dilated in size with <50% respiratory variability, suggesting right atrial pressure of 15 mmHg. FINDINGS  Left Ventricle: Left ventricular ejection fraction, by estimation, is 25 to 30%. The left ventricle has severely decreased function. The left ventricle demonstrates global hypokinesis. The left ventricular internal cavity size was normal in size. There is moderate concentric left ventricular hypertrophy. Left ventricular diastolic parameters are indeterminate. Elevated left ventricular end-diastolic pressure. Right Ventricle: The right ventricular size is normal. No increase in right ventricular wall thickness. Right ventricular systolic function is normal. Left Atrium: Left atrial size was normal in size. Right Atrium: Right atrial size was normal in size. Pericardium: There is no evidence of pericardial effusion. Mitral Valve: The mitral valve is normal in structure and function. Mild mitral valve regurgitation. Tricuspid Valve: The tricuspid valve is normal in structure. Tricuspid valve regurgitation is trivial. No  evidence of tricuspid stenosis. Aortic Valve: The aortic valve is tricuspid. Aortic valve regurgitation is not visualized. No aortic stenosis is present. Pulmonic Valve: The pulmonic valve was not well visualized. Pulmonic valve regurgitation is not visualized. No evidence of pulmonic stenosis. Aorta: The aortic root and ascending aorta are structurally normal, with no evidence of dilitation. Venous: The inferior vena cava is dilated in size with less than 50% respiratory variability, suggesting right atrial pressure of 15 mmHg. IAS/Shunts: No atrial level shunt detected by color flow Doppler.  LEFT VENTRICLE PLAX 2D LVIDd:         4.51 cm      Diastology LVIDs:         3.70 cm  LV e' lateral:   3.57 cm/s LV PW:         1.59 cm      LV E/e' lateral: 15.4 LV IVS:        1.47 cm      LV e' medial:    2.61 cm/s LVOT diam:     2.00 cm      LV E/e' medial:  21.1 LV SV:         66.60 ml LV SV Index:   19.08 LVOT Area:     3.14 cm  LV Volumes (MOD) LV vol d, MOD A2C: 208.0 ml LV vol d, MOD A4C: 200.0 ml LV vol s, MOD A2C: 123.0 ml LV vol s, MOD A4C: 132.0 ml LV SV MOD A2C:     85.0 ml LV SV MOD A4C:     200.0 ml LV SV MOD BP:      77.0 ml RIGHT VENTRICLE RV S prime:     8.32 cm/s TAPSE (M-mode): 2.4 cm LEFT ATRIUM             Index       RIGHT ATRIUM           Index LA diam:        3.40 cm 1.92 cm/m  RA Area:     10.80 cm LA Vol (A2C):   52.9 ml 29.80 ml/m RA Volume:   26.70 ml  15.04 ml/m LA Vol (A4C):   41.7 ml 23.49 ml/m LA Biplane Vol: 48.8 ml 27.49 ml/m  AORTIC VALVE LVOT Vmax:   96.00 cm/s LVOT Vmean:  57.600 cm/s LVOT VTI:    0.212 m  AORTA Ao Root diam: 3.10 cm MITRAL VALVE MV Area (PHT): 2.05 cm     SHUNTS MV Decel Time: 370 msec     Systemic VTI:  0.21 m MV E velocity: 55.10 cm/s   Systemic Diam: 2.00 cm MV A velocity: 125.00 cm/s MV E/A ratio:  0.44 Buford Dresser MD Electronically signed by Buford Dresser MD Signature Date/Time: 08/03/2019/8:56:44 PM    Final    ECHOCARDIOGRAM LIMITED  BUBBLE STUDY  Result Date: 08/04/2019    ECHOCARDIOGRAM LIMITED REPORT   Patient Name:   [name] Date of Exam: 08/04/2019 Medical Rec #:  [id number]      Height:       64.0 in Accession #:    [id number]     Weight:       159.0 lb Date of Birth:  [birth year]      BSA:          1.77 m Patient Age:    21 years       BP:           96/63 mmHg Patient Gender: F              HR:           121 bpm. Exam Location:  Inpatient Procedure: Limited Echo, Color Doppler, Cardiac Doppler, Saline Contrast Bubble            Study and Intracardiac Opacification Agent STAT ECHO Indications:    Cardiac Arrest, Stroke  History:        Patient has prior history of Echocardiogram examinations, most                 recent 08/03/2019. Risk Factors:Hypertension and Dyslipidemia.  Sonographer:    Raquel Sarna Senior RDCS Referring Phys: [id number] Candee Furbish  Sonographer Comments: Limited to assess  for cardiac source of thrombus or shunting. IMPRESSIONS  1. Left ventricular ejection fraction, by estimation, is 25 to 30%. The left ventricle has severely decreased function. The left ventricle demonstrates global hypokinesis. There was marked septal-lateral dyssynchrony consistent with LBBB. No LV thrombus  noted on Definity study.  2. Right ventricular systolic function is normal. The right ventricular size is normal. There is normal pulmonary artery systolic pressure.  3. The aortic valve is tricuspid. Aortic valve regurgitation is not visualized. No aortic stenosis is present.  4. The inferior vena cava is normal in size with <50% respiratory variability, suggesting right atrial pressure of 8 mmHg.  5. Bubble study appeared negative for shunt lesion but was a difficult study due to poor windows.  6. Limited echo. FINDINGS  Left Ventricle: Left ventricular ejection fraction, by estimation, is 25 to 30%. The left ventricle has severely decreased function. The left ventricle demonstrates global hypokinesis. Definity contrast agent was given IV to  delineate the left ventricular endocardial borders. The left ventricular internal cavity size was normal in size. There is no left ventricular hypertrophy. Abnormal (paradoxical) septal motion, consistent with left bundle branch block. Right Ventricle: The right ventricular size is normal. No increase in right ventricular wall thickness. Right ventricular systolic function is normal. There is normal pulmonary artery systolic pressure. The tricuspid regurgitant velocity is 2.21 m/s, and  with an assumed right atrial pressure of 8 mmHg, the estimated right ventricular systolic pressure is 74.0 mmHg. Left Atrium: Left atrial size was normal in size. Right Atrium: Right atrial size was normal in size. Tricuspid Valve: The tricuspid valve is normal in structure. Tricuspid valve regurgitation is trivial. Aortic Valve: The aortic valve is tricuspid. Aortic valve regurgitation is not visualized. No aortic stenosis is present. Pulmonic Valve: The pulmonic valve was normal in structure. Pulmonic valve regurgitation is not visualized. Venous: The inferior vena cava is normal in size with less than 50% respiratory variability, suggesting right atrial pressure of 8 mmHg. IAS/Shunts: Agitated saline contrast was given intravenously to evaluate for intracardiac shunting.  RIGHT VENTRICLE RV S prime:     9.14 cm/s TAPSE (M-mode): 1.9 cm AORTIC VALVE LVOT Vmax:   75.00 cm/s LVOT Vmean:  53.300 cm/s LVOT VTI:    0.142 m TRICUSPID VALVE TR Peak grad:   19.5 mmHg TR Vmax:        221.00 cm/s  SHUNTS Systemic VTI: 0.14 m Loralie Champagne MD Electronically signed by Loralie Champagne MD Signature Date/Time: 08/04/2019/10:47:14 AM    Final    VAS US CAROTID (at [facility] and [facility] only)  Result Date: 08/03/2019 Carotid Arterial Duplex Study Indications: CVA. Performing Technologist: Maudry Mayhew MHA, RDMS, RVT, RDCS  Examination Guidelines: A complete evaluation includes B-mode imaging, spectral Doppler, color Doppler, and power Doppler as needed  of all accessible portions of each vessel. Bilateral testing is considered an integral part of a complete examination. Limited examinations for reoccurring indications may be performed as noted.  Right Carotid Findings: +----------+--------+--------+--------+--------------------------+--------+           PSV cm/sEDV cm/sStenosisPlaque Description        Comments +----------+--------+--------+--------+--------------------------+--------+ CCA Prox  74      17                                                 +----------+--------+--------+--------+--------------------------+--------+ CCA Distal74  12              heterogenous and smooth            +----------+--------+--------+--------+--------------------------+--------+ ICA Prox  55      17              heterogenous and irregular         +----------+--------+--------+--------+--------------------------+--------+ ICA Distal84      26                                                 +----------+--------+--------+--------+--------------------------+--------+ ECA       111     11              smooth and heterogenous            +----------+--------+--------+--------+--------------------------+--------+ +----------+--------+-------+----------------+-------------------+           PSV cm/sEDV cmsDescribe        Arm Pressure (mmHG) +----------+--------+-------+----------------+-------------------+ [id number]            Multiphasic, WNL                    +----------+--------+-------+----------------+-------------------+ +---------+--------+--+--------+-+---------+ VertebralPSV cm/s27EDV cm/s7Antegrade +---------+--------+--+--------+-+---------+  Left Carotid Findings: +----------+--------+--------+--------+-----------------------+--------+           PSV cm/sEDV cm/sStenosisPlaque Description     Comments +----------+--------+--------+--------+-----------------------+--------+ CCA Prox  74      12               smooth and heterogenous         +----------+--------+--------+--------+-----------------------+--------+ CCA Distal81      18                                              +----------+--------+--------+--------+-----------------------+--------+ ICA Prox  91      15              smooth and heterogenous         +----------+--------+--------+--------+-----------------------+--------+ ICA Distal66      13                                              +----------+--------+--------+--------+-----------------------+--------+ ECA       98      10              smooth and heterogenous         +----------+--------+--------+--------+-----------------------+--------+ +----------+--------+--------+----------------+-------------------+           PSV cm/sEDV cm/sDescribe        Arm Pressure (mmHG) +----------+--------+--------+----------------+-------------------+ [id number]             Multiphasic, WNL                    +----------+--------+--------+----------------+-------------------+ +---------+--------+--+--------+--+---------+ VertebralPSV cm/s59EDV cm/s13Antegrade +---------+--------+--+--------+--+---------+   Summary: Right Carotid: Velocities in the right ICA are consistent with a 1-39% stenosis. Left Carotid: Velocities in the left ICA are consistent with a 1-39% stenosis. Vertebrals:  Bilateral vertebral arteries demonstrate antegrade flow. Subclavians: Normal flow hemodynamics were seen in bilateral subclavian              arteries. *See table(s) above for measurements and  observations.  Electronically signed by Antony Contras MD on 08/03/2019 at 5:14:31 PM.    Final    CT Maxillofacial Wo Contrast  Result Date: 08/02/2019 CLINICAL DATA:  Transient ischemic attack (TIA). Maxillofacial pain. Additional history provided: Patient presents for left-sided facial pressure and dizziness, pressure feeling radiates to left side of face and to ear. EXAM: CT HEAD  WITHOUT CONTRAST CT MAXILLOFACIAL WITHOUT CONTRAST TECHNIQUE: Contiguous axial images were obtained from the base of the skull through the vertex without intravenous contrast. Multidetector CT imaging of the maxillofacial structures was performed. Multiplanar CT image reconstructions were also generated. A small metallic BB was placed on the right temple in order to reliably differentiate right from left. COMPARISON:  No pertinent prior studies available for comparison. FINDINGS: CT HEAD FINDINGS Brain: Streak artifact limits evaluation of the inferomedial cerebellum. There is no evidence of acute intracranial hemorrhage. No demarcated cortical infarction. No evidence of intracranial mass. No midline shift or extra-axial fluid collection. There is a small region of ill-defined hypodensity the right frontal lobe white matter (series 4, images 20-24). Age-indeterminate lacunar infarct within the right lentiform nucleus/external capsule (series 4, images 16 and 17). Small chronic lacunar infarct within the right caudate. 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 Mild generalized parenchymal atrophy. Vascular: No hyperdense vessel.  Atherosclerotic calcifications Skull: Sequela of prior posterior fossa decompression, including a postsurgical defect within the C1 posterior arch. No calvarial fracture or aggressive osseous lesion. CT MAXILLOFACIAL FINDINGS Osseous: No maxillofacial fracture. Small bony exostosis along the superficial aspect of the posterior right mandibular body (series 5, image 29). Orbits: No abnormality identified. Sinuses: No significant paranasal sinus disease. Soft tissues: Carotid artery calcified plaque. The visualized maxillofacial and upper neck soft tissues are otherwise unremarkable. Streak artifact from dental restoration significant limits evaluation of the oral cavity and also somewhat limits evaluation of the oropharynx. Within this limitation, there is no appreciable  mass or swelling within the oral cavity, pharynx or larynx. Other: The temporomandibular joints are unremarkable. Left mastoid effusion. IMPRESSION: CT head: 1. Age-indeterminate lacunar infarct within the right lentiform nucleus/external capsule. 2. Small focus of ill-defined hypoattenuation within the right frontal lobe subcortical white matter which may reflect chronic ischemic change. A recent white matter infarct at this site cannot be excluded. 3. Chronic lacunar infarct within the right caudate. 4. 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 5. Mild generalized parenchymal atrophy. CT maxillofacial: 1. Streak artifact from dental restoration limits evaluation of the oral cavity and oropharynx. Within this limitation, no swelling or discrete mass is appreciated within the oral cavity or pharynx. 2. Small left mastoid effusion. Electronically Signed   By: Kellie Simmering DO   On: 08/02/2019 18:50    PHYSICAL EXAM Pleasant mildly obese elderly Caucasian lady not in distress. . Afebrile. Head is nontraumatic. Neck is supple without bruit.    Cardiac exam no murmur or gallop. Lungs are clear to auscultation. Distal pulses are well felt. Neurological Exam ;  Awake  Alert oriented x 3. Normal speech and language.eye movements full without nystagmus.fundi were not visualized. Vision acuity and fields appear normal. Hearing is normal. Palatal movements are normal. Face symmetric. Tongue midline. Normal strength, tone, reflexes and coordination.  Except mild diminished fine finger movements on the left.  Orbits right over left upper extremity.  Mild left hip flexor and ankle dorsiflexor weakness.  Subtle left leg drift on double simultaneous testing of lower extremities.  Normal sensation except over the left cheek where there  is some hyperesthesia.. Gait deferred.  ASSESSMENT/PLAN Ms. MURIAL BEAM is a 69 y.o. female with history of Chiari I malformation s/p decompression, HLD and HTN  presenting with dysphagia, L hemiparesis, dizziness and L facial pressure.   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 fib cardiac arrest this morning with subtle increased left-sided weakness following the event.  CT head age indeterminate R lentiform nucleus / external capsule infarct. Small R frontal lobe subcortical white matter hypoattenuation, ? Chronic infarct. Old R caudate infarct.  Chiari I malformation. Mild atrophy.   CT Maxillofacial no mass, small L mastoid effusion  MRI  Acute R basal ganglia infarct   MRA  normal  Carotid Doppler bilateral mild plaques and 1-39% carotid stenosis 2D Echo pending   [place] electrophysiologist will consult and consider placement of an implantable loop recorder to evaluate for atrial fibrillation as etiology of stroke. This has been explained to patient by Dr. Leonie Man and she is agreeable. Will place tomorrow.  LDL 175  HgbA1c 5.7  Lovenox 40 mg sq daily for VTE prophylaxis  No antithrombotic prior to admission, now on aspirin 325 mg daily. Will change to aspirin 81, add plavix. Continue DAPT x 3 weeks then ASPIRIN alone.   Therapy recommendations:  No PT or OT. F/u SLP for swallow  Disposition:  Return home  Hypertension  Stable . Permissive hypertension (OK if < 220/120) but gradually normalize in 5-7 days . Long-term BP goal normotensive  Hyperlipidemia  Home meds:  No statin  LDL 175, goal < 70  Intolerant to statins (muscle pain)  Add zetia   Consider [facility] as an OP  Continue statin at discharge  Dysphagia . Secondary to stroke . Cleared for D3 nectar thick liquids . Speech on board   Other Stroke Risk Factors  Advanced age  Cigarette smoker, advised to stop smoking  Overweight, Body mass index is 27.28 kg/m., recommend weight loss, diet and exercise as appropriate   Other Active Problems  Hx Chiari I decompression  CKD stage  III  Hypokalemia, repleted, resolved  LBBB  Hospital day # 1 Recommend repeat stat CT scan of the head and if no hemorrhage start anticoagulation with IV heparin given atrial fibrillation and significant diminished cardiac function and cardiomyopathy.  His CT scan shows no bleed patient may be taken for emergent cardiac catheterization and revascularization if necessary..  Long discussion with patient and her husband and answered questions.  Discussed with Dr. Tamala Julian and Einar Gip .This patient is critically ill and at significant risk of neurological worsening, death and care requires constant monitoring of vital signs, hemodynamics,respiratory and cardiac monitoring, extensive review of multiple databases, frequent neurological assessment, discussion with family, other specialists and medical decision making of high complexity.I have made any additions or clarifications directly to the above note.This critical care time does not reflect procedure time, or teaching time or supervisory time of PA/NP/Med Resident etc but could involve care discussion time.  I spent 30 minutes of neurocritical care time  in the care of  this patient.     Antony Contras, MD To contact Stroke Continuity provider, please refer to [email or web address] After hours, contact General Neurology

## 2019-08-04 NOTE — Consult Note (Addendum)
Cardiology Consultation:   Patient ID: [name] MRN: [id number]; DOB: [birth year]  Admit date: 08/02/2019 Date of Consult: 08/04/2019  Primary Care Provider: Pleas Koch, NP Primary Cardiologist: new, Dr. Einar Gip Primary Electrophysiologist:  None    Patient Profile:   [name] is a 69 y.o. female with a hx of HTN, HLD, Arnold-Chiari malformation s/p decompresssion, ongoing smoker who is being seen today for the evaluation of VF arrest at the request of Dr. Einar Gip.  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 Initially clinical course progressing and EP was to be consulted to consider loop for AFib surveillance.  Though this AM she suffered a VF arrest.  initial TTE for stroke w/u noted severely depressed LVEF w/global hypokinesis, 25-30%, mod conc LVH, post arrest unchanged, underwent LHC today noting 70-80% RCA lesion, though no CAD that explains her global hypokinesis and severe reduction in LVEF, not felt to represent stress CM. She had no prior cardic hx outside of her HTN, on amlodipine, benazepril for this (no BB).  K+ 3.9 > 3.2 Mag 3.5 (post arrest) BUN/Creat 04/16/33 HS trop 53 > 457 WBC 23.2 (post arrest) H/H 14/45 Plts 254  Lactic acid 7.3 > 2.7  COVID negative  The patient reports that she does not remember her events from this morning.  She is currently feeling well with some weakness and fatigue.  She continues to have some left facial weakness as well as left arm weakness.    Heart Pathway Score:     Past Medical History:  Diagnosis Date  . Allergy   . Anxiety   . Arnold-Chiari malformation ([place])   . Arthritis   . GERD (gastroesophageal reflux disease)   . H. pylori infection    3 years ago  . Hyperlipidemia   . Incontinence   . Syncope and collapse    Per  pt, denies passing out  . Tobacco use disorder   . Unspecified essential hypertension     Past Surgical History:  Procedure Laterality Date  . APPENDECTOMY    . ARNOLD CHIARI SURGERY     neurocranial surgery  . BLEPHAROPLASTY     Bil  . C-EYE SURGERY PROCEDURE    . CHOLECYSTECTOMY    . EYE SURGERY    . LEFT HEART CATH AND CORONARY ANGIOGRAPHY N/A 08/04/2019   Procedure: LEFT HEART CATH AND CORONARY ANGIOGRAPHY;  Surgeon: Adrian Prows, MD;  Location: [facility];  Service: Cardiovascular;  Laterality: N/A;  . MASS EXCISION Right 12/27/2013   Procedure: MINOR EXCISION OF RIGHT THUMB MUCOID CYST, DEBRIDEMENT OF INTERPHALANGEAL JOINT;  Surgeon: Cammie Sickle, MD;  Location: [place];  Service: Orthopedics;  Laterality: Right;  . mass on thumb  right  . TOTAL KNEE ARTHROPLASTY Right 02/17/2017  . TOTAL KNEE ARTHROPLASTY Right 02/17/2017   Procedure: TOTAL KNEE ARTHROPLASTY;  Surgeon: Melrose Nakayama, MD;  Location: [place];  Service: Orthopedics;  Laterality: Right;  . TUBAL LIGATION       Home Medications:  Prior to Admission medications   Medication Sig Start Date End Date Taking? Authorizing Provider  acetaminophen (TYLENOL) 500 MG tablet Take 1,000 mg by mouth 2 (two) times daily as needed (pain).   Yes [provider]  amLODipine-benazepril (LOTREL) 5-20 MG capsule Take 1 capsule by mouth daily. For  blood pressure. 12/13/18  Yes Pleas Koch, NP  docusate sodium (COLACE) 100 MG capsule Take 100 mg by mouth daily as needed for mild constipation.   Yes [provider]  loratadine (CLARITIN) 10 MG tablet Take 10 mg by mouth daily as needed for allergies.    Yes [provider]    Inpatient Medications: Scheduled Meds: . aspirin  81 mg Per Tube Daily  . atorvastatin  10 mg Per Tube q1800  . carvedilol  6.25 mg Oral BID WC  . chlorhexidine  15 mL Mouth Rinse BID  . chlorhexidine gluconate (MEDLINE KIT)  15 mL Mouth Rinse BID  .  Chlorhexidine Gluconate Cloth  6 each Topical Daily  . clopidogrel  75 mg Per Tube Daily  . enoxaparin (LOVENOX) injection  40 mg Subcutaneous Q24H  . ezetimibe  10 mg Per Tube Daily  . insulin aspart  0-9 Units Subcutaneous Q4H  . mouth rinse  15 mL Mouth Rinse q12n4p  . pantoprazole (PROTONIX) IV  40 mg Intravenous QHS  . sodium chloride flush  3 mL Intravenous Q12H   Continuous Infusions: . sodium chloride    . dexmedetomidine (PRECEDEX) IV infusion Stopped (08/04/19 0842)   PRN Meds: acetaminophen **OR** acetaminophen (TYLENOL) oral liquid 160 mg/5 mL **OR** acetaminophen, bisacodyl, fentaNYL (SUBLIMAZE) injection, hydrALAZINE, ondansetron (ZOFRAN) IV, Resource ThickenUp Clear, sodium chloride flush  Allergies:    Allergies  Allergen Reactions  . Shellfish-Derived Products Anaphylaxis  . Ativan [Lorazepam] Other (See Comments)    Makes "skin crawl" and insomnia  . Buspar [Buspirone] Nausea Only    Sweating and dizzy  . Clarithromycin Swelling  . Codeine Nausea And Vomiting  . Doxycycline Other (See Comments)    Unknown  . Guaifenesin Other (See Comments)    Palpitations  . Oxycodone Nausea And Vomiting  . Prednisone Nausea And Vomiting and Other (See Comments)    Makes my heart race  . Statins Other (See Comments)    Muscle pain  . Sulfonamide Derivatives Nausea And Vomiting    Achiness  . Tetracycline Nausea Only  . Vicodin [Hydrocodone-Acetaminophen] Nausea And Vomiting  . Famotidine Rash  . Latex Rash  . Omeprazole Nausea Only    Cough, shortness of breath - patient doesn't remember  . Peanut-Containing Drug Products Itching and Rash  . Wellbutrin [Bupropion] Palpitations    Social History:   Social History   Socioeconomic History  . Marital status: Married    Spouse name: Not on file  . Number of children: 2  . Years of education: Not on file  . Highest education level: Not on file  Occupational History  . Occupation: hair dresser  Tobacco Use  .  Smoking status: Current Every Day Smoker    Packs/day: 1.00    Years: 50.00    Pack years: 50.00    Types: Cigarettes  . Smokeless tobacco: Never Used  Substance and Sexual Activity  . Alcohol use: No    Alcohol/week: 0.0 standard drinks  . Drug use: No  . Sexual activity: Not on file  Other Topics Concern  . Not on file  Social History Narrative  . Not on file   Social Determinants of Health   Financial Resource Strain:   . Difficulty of Paying Living Expenses: Not on file  Food Insecurity:   . Worried About Charity fundraiser in the Last Year: Not on file  . Ran Out of Food in the Last Year: Not on file  Transportation Needs:   .  Lack of Transportation (Medical): Not on file  . Lack of Transportation (Non-Medical): Not on file  Physical Activity:   . Days of Exercise per Week: Not on file  . Minutes of Exercise per Session: Not on file  Stress:   . Feeling of Stress : Not on file  Social Connections:   . Frequency of Communication with Friends and Family: Not on file  . Frequency of Social Gatherings with Friends and Family: Not on file  . Attends Religious Services: Not on file  . Active Member of Clubs or Organizations: Not on file  . Attends Archivist Meetings: Not on file  . Marital Status: Not on file  Intimate Partner Violence:   . Fear of Current or Ex-Partner: Not on file  . Emotionally Abused: Not on file  . Physically Abused: Not on file  . Sexually Abused: Not on file    Family History:   Family History  Problem Relation Age of Onset  . Bone cancer Mother   . Heart disease Mother   . Cancer Sister        metastatic; unknown primary  . Diverticulosis Sister   . Tuberculosis Father   . Diabetes Sister   . Hypertension Sister   . Colon cancer Neg Hx   . Esophageal cancer Neg Hx   . Pancreatic cancer Neg Hx   . Stomach cancer Neg Hx   . Liver disease Neg Hx   . Kidney disease Neg Hx   . Rectal cancer Neg Hx      ROS:  Please see  the history of present illness.  All other ROS reviewed and negative.     Physical Exam/Data:   Vitals:   08/04/19 1510 08/04/19 1525 08/04/19 1540 08/04/19 1555  BP: (!) 129/52 (!) 141/50 (!) 139/46 (!) 133/52  Pulse: 70 70 72 72  Resp: (!) 21 20 (!) 21 (!) 25  Temp:      TempSrc:      SpO2: 97% 96% 96% 94%  Weight:      Height:        Intake/Output Summary (Last 24 hours) at 08/04/2019 1800 Last data filed at 08/04/2019 1600 Gross per 24 hour  Intake 83.6 ml  Output 50 ml  Net 33.6 ml   Last 3 Weights 08/04/2019 08/03/2019 12/13/2018  Weight (lbs) 158 lb 15.2 oz 159 lb 2.8 oz 164 lb  Weight (kg) 72.1 kg 72.2 kg 74.39 kg     Body mass index is 27.28 kg/m.  General:  Well nourished, well developed, in no acute distress HEENT: normal Lymph: no adenopathy Neck: no JVD Endocrine:  No thryomegaly Vascular: No carotid bruits; FA pulses 2+ bilaterally without bruits  Cardiac:  normal S1, S2; RRR; no murmur  Lungs:  clear to auscultation bilaterally, no wheezing, rhonchi or rales  Abd: soft, nontender, no hepatomegaly  Ext: no edema Musculoskeletal:  No deformities, BUE and BLE strength normal and equal Skin: warm and dry  Neuro:  CNs 2-12 intact, no focal abnormalities noted Psych:  Normal affect   EKG:  The EKG was personally reviewed and demonstrates:   SR 69, LBBB LAD Today SR 66, LBBB, LAD  02/10/2017 SR 68, QRS 50m  Telemetry:  Telemetry was personally reviewed and demonstrates:   SR >> Torsades >> VF Sinus rhythm with episode of AF  Relevant CV Studies:   Left Heart Catheterization 08/04/19:  LV: Global hypokinesis, upper limit of normal size, EF 25 to 30%. Left main: Normal.  LAD: Mild diffuse disease.  Proximal LAD has a 30 to 40% stenosis, mid segment has a 20 to 30% stenosis, scattered disease noted in the LAD.  Brisk flow. Circumflex: Again scattered disease noted in the circumflex.  Mid segment has at most a 30 to 40% stenosis which appears to be  eccentric and calcified. RCA: Dominant.  Mild diffuse disease again noted.  Mid segment after the origin of RV branch has a 70 to 80% stenosis.  Brisk flow is evident throughout the RCA.  Impression: Findings consistent with nonischemic cardiomyopathy.  Although she has significant disease in the right coronary artery, this does not explain her presentation with global hypokinesis, neither does it explain VF arrest as the lesion does not appear to be unstable. 35 mL contrast used.     08/04/2019: TTE IMPRESSIONS  1. Left ventricular ejection fraction, by estimation, is 25 to 30%. The  left ventricle has severely decreased function. The left ventricle  demonstrates global hypokinesis. There was marked septal-lateral  dyssynchrony consistent with LBBB. No LV thrombus  noted on Definity study.  2. Right ventricular systolic function is normal. The right ventricular  size is normal. There is normal pulmonary artery systolic pressure.  3. The aortic valve is tricuspid. Aortic valve regurgitation is not  visualized. No aortic stenosis is present.  4. The inferior vena cava is normal in size with <50% respiratory  variability, suggesting right atrial pressure of 8 mmHg.  5. Bubble study appeared negative for shunt lesion but was a difficult  study due to poor windows.  6. Limited echo.    08/03/2019: TTE IMPRESSIONS  1. Severely depressed LVEF with global hypokinesis. No apical thrombus  visualized on this study, however would consider repeat limited echo with  contrast to exclude LV thrombus. Could also consider TEE for further  evaluation of cardiac embolism.  2. Left ventricular ejection fraction, by estimation, is 25 to 30%. The  left ventricle has severely decreased function. The left ventricle  demonstrates global hypokinesis. There is moderate concentric left  ventricular hypertrophy. Left ventricular  diastolic parameters are indeterminate. Elevated left ventricular    end-diastolic pressure.  3. Right ventricular systolic function is normal. The right ventricular  size is normal.  4. The mitral valve is normal in structure and function. Mild mitral  valve regurgitation.  5. The aortic valve is tricuspid. Aortic valve regurgitation is not  visualized. No aortic stenosis is present.  6. The inferior vena cava is dilated in size with <50% respiratory  variability, suggesting right atrial pressure of 15 mmHg.   Laboratory Data:  High Sensitivity Troponin:   Recent Labs  Lab 08/04/19 0652 08/04/19 1023  TROPONINIHS 53* 457*     Chemistry Recent Labs  Lab 08/03/19 1000 08/04/19 0209 08/04/19 0652  NA 143 143 143  K 3.9 3.9 3.2*  CL 107 107 110  CO2 25 26 17*  GLUCOSE 103* 95 226*  BUN '9 10 11  '$ CREATININE 1.04* 1.10* 1.34*  CALCIUM 8.8* 9.1 8.6*  GFRNONAA 55* 51* 40*  GFRAA >60 59* 47*  ANIONGAP 11 10 16*    Recent Labs  Lab 08/04/19 0652  PROT 6.2*  ALBUMIN 3.5  AST 183*  ALT 101*  ALKPHOS 77  BILITOT 0.9   Hematology Recent Labs  Lab 08/03/19 1000 08/04/19 0209 08/04/19 0652  WBC 6.6 6.9 23.2*  RBC 4.75 4.63 4.90  HGB 14.3 13.7 14.6  HCT 43.3 41.9 45.4  MCV 91.2 90.5 92.7  MCH 30.1 29.6  29.8  MCHC 33.0 32.7 32.2  RDW 13.2 13.2 13.2  PLT 251 224 254   BNPNo results for input(s): BNP, PROBNP in the last 168 hours.  DDimer No results for input(s): DDIMER in the last 168 hours.   Radiology/Studies:    CT HEAD WO CONTRAST Result Date: 08/04/2019 CLINICAL DATA:  Neuro deficit with stroke suspected EXAM: CT HEAD WITHOUT CONTRAST TECHNIQUE: Contiguous axial images were obtained from the base of the skull through the vertex without intravenous contrast. 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 No evidence of infarct progression or hemorrhagic transformation. Suboccipital craniectomy. No hydrocephalus or masslike finding. History of Chiari  malformation. Vascular: No hyperdense vessel. Skull: Suboccipital craniectomy Sinuses/Orbits: Negative IMPRESSION: 1. No acute finding when compared to MRI 2 days ago. 2. Recent right basal ganglia perforator infarct without hemorrhage or visible progression. Electronically Signed   By: Monte Fantasia M.D.   On: 08/04/2019 11:04   CT Head Wo Contrast  Result Date: 08/02/2019 CLINICAL DATA:  Transient ischemic attack (TIA). Maxillofacial pain. Additional history provided: Patient presents for left-sided facial pressure and dizziness, pressure feeling radiates to left side of face and to ear. EXAM: CT HEAD WITHOUT CONTRAST CT MAXILLOFACIAL WITHOUT CONTRAST TECHNIQUE: Contiguous axial images were obtained from the base of the skull through the vertex without intravenous contrast. Multidetector CT imaging of the maxillofacial structures was performed. Multiplanar CT image reconstructions were also generated. A small metallic BB was placed on the right temple in order to reliably differentiate right from left. COMPARISON:  No pertinent prior studies available for comparison. FINDINGS: CT HEAD FINDINGS Brain: Streak artifact limits evaluation of the inferomedial cerebellum. There is no evidence of acute intracranial hemorrhage. No demarcated cortical infarction. No evidence of intracranial mass. No midline shift or extra-axial fluid collection. There is a small region of ill-defined hypodensity the right frontal lobe white matter (series 4, images 20-24). Age-indeterminate lacunar infarct within the right lentiform nucleus/external capsule (series 4, images 16 and 17). Small chronic lacunar infarct within the right caudate. 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 Mild generalized parenchymal atrophy. Vascular: No hyperdense vessel.  Atherosclerotic calcifications Skull: Sequela of prior posterior fossa decompression, including a postsurgical defect within the C1 posterior arch. No calvarial  fracture or aggressive osseous lesion. CT MAXILLOFACIAL FINDINGS Osseous: No maxillofacial fracture. Small bony exostosis along the superficial aspect of the posterior right mandibular body (series 5, image 29). Orbits: No abnormality identified. Sinuses: No significant paranasal sinus disease. Soft tissues: Carotid artery calcified plaque. The visualized maxillofacial and upper neck soft tissues are otherwise unremarkable. Streak artifact from dental restoration significant limits evaluation of the oral cavity and also somewhat limits evaluation of the oropharynx. Within this limitation, there is no appreciable mass or swelling within the oral cavity, pharynx or larynx. Other: The temporomandibular joints are unremarkable. Left mastoid effusion. IMPRESSION: CT head: 1. Age-indeterminate lacunar infarct within the right lentiform nucleus/external capsule. 2. Small focus of ill-defined hypoattenuation within the right frontal lobe subcortical white matter which may reflect chronic ischemic change. A recent white matter infarct at this site cannot be excluded. 3. Chronic lacunar infarct within the right caudate. 4. 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 5. Mild generalized parenchymal atrophy. CT maxillofacial: 1. Streak artifact from dental restoration limits evaluation of the oral cavity and oropharynx. Within this limitation, no swelling or discrete mass is appreciated within the oral cavity or pharynx. 2. Small  left mastoid effusion. Electronically Signed   By: Kellie Simmering DO   On: 08/02/2019 18:50   MR ANGIO HEAD WO CONTRAST  Result Date: 08/02/2019 CLINICAL DATA:  Left-sided facial numbness EXAM: MRI HEAD WITHOUT CONTRAST MRA HEAD WITHOUT CONTRAST TECHNIQUE: Multiplanar, multiecho pulse sequences of the brain and surrounding structures were obtained without intravenous contrast. Angiographic images of the head were obtained using MRA technique without contrast. COMPARISON:   08/02/2019 FINDINGS: MRI HEAD FINDINGS Brain: There is an area of abnormal diffusion restriction within the right basal ganglia. Multifocal white matter hyperintensity, most commonly due to chronic ischemic microangiopathy. Normal volume of CSF spaces. No chronic microhemorrhage. Normal midline structures. Vascular: Normal flow voids. Skull and upper cervical spine: Normal marrow signal. Sinuses/Orbits: Small amount of left mastoid fluid. Sinuses are clear. Normal orbits. Other: None MRA HEAD FINDINGS POSTERIOR CIRCULATION: --Vertebral arteries: Normal V4 segments. --Posterior inferior cerebellar arteries (PICA): Patent origins from the vertebral arteries. --Anterior inferior cerebellar arteries (AICA): Patent origins from the basilar artery. --Basilar artery: Normal. --Superior cerebellar arteries: Normal. --Posterior cerebral arteries: Normal. Both originate from the basilar artery. Posterior communicating arteries (p-comm) are diminutive or absent. ANTERIOR CIRCULATION: --Intracranial internal carotid arteries: Normal. --Anterior cerebral arteries (ACA): Normal. Both A1 segments are present. Patent anterior communicating artery (a-comm). --Middle cerebral arteries (MCA): Normal. IMPRESSION: 1. 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 No hemorrhage or mass effect. 2. Normal intracranial MRA. Electronically Signed   By: Ulyses Jarred M.D.   On: 08/02/2019 22:02   MR BRAIN WO CONTRAST  Result Date: 08/02/2019 CLINICAL DATA:  Left-sided facial numbness EXAM: MRI HEAD WITHOUT CONTRAST MRA HEAD WITHOUT CONTRAST TECHNIQUE: Multiplanar, multiecho pulse sequences of the brain and surrounding structures were obtained without intravenous contrast. Angiographic images of the head were obtained using MRA technique without contrast. COMPARISON:  08/02/2019 FINDINGS: MRI HEAD FINDINGS Brain: There is an area of abnormal diffusion restriction within the right basal ganglia. Multifocal white matter hyperintensity,  most commonly due to chronic ischemic microangiopathy. Normal volume of CSF spaces. No chronic microhemorrhage. Normal midline structures. Vascular: Normal flow voids. Skull and upper cervical spine: Normal marrow signal. Sinuses/Orbits: Small amount of left mastoid fluid. Sinuses are clear. Normal orbits. Other: None MRA HEAD FINDINGS POSTERIOR CIRCULATION: --Vertebral arteries: Normal V4 segments. --Posterior inferior cerebellar arteries (PICA): Patent origins from the vertebral arteries. --Anterior inferior cerebellar arteries (AICA): Patent origins from the basilar artery. --Basilar artery: Normal. --Superior cerebellar arteries: Normal. --Posterior cerebral arteries: Normal. Both originate from the basilar artery. Posterior communicating arteries (p-comm) are diminutive or absent. ANTERIOR CIRCULATION: --Intracranial internal carotid arteries: Normal. --Anterior cerebral arteries (ACA): Normal. Both A1 segments are present. Patent anterior communicating artery (a-comm). --Middle cerebral arteries (MCA): Normal. IMPRESSION: 1. 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 No hemorrhage or mass effect. 2. Normal intracranial MRA. Electronically Signed   By: Ulyses Jarred M.D.   On: 08/02/2019 22:02   CARDIAC CATHETERIZATION  Result Date: 08/04/2019 Left Heart Catheterization 08/04/19: LV: Global hypokinesis, upper limit of normal size, EF 25 to 30%. Left main: Normal. LAD: Mild diffuse disease.  Proximal LAD has a 30 to 40% stenosis, mid segment has a 20 to 30% stenosis, scattered disease noted in the LAD.  Brisk flow. Circumflex: Again scattered disease noted in the circumflex.  Mid segment has at most a 30 to 40% stenosis which appears to be eccentric and calcified. RCA: Dominant.  Mild diffuse disease again noted.  Mid segment after the origin of RV branch has a  70 to 80% stenosis.  Brisk flow is evident throughout the RCA. Impression: Findings consistent with nonischemic cardiomyopathy.  Although she  has significant disease in the right coronary artery, this does not explain her presentation with global hypokinesis, neither does it explain VF arrest as the lesion does not appear to be unstable. 35 mL contrast used.   DG CHEST PORT 1 VIEW  Result Date: 08/04/2019 CLINICAL DATA:  Respiratory arrest EXAM: PORTABLE CHEST 1 VIEW COMPARISON:  02/10/2017 FINDINGS: Endotracheal tube with tip 10 mm above the carina. Symmetric low volume lungs with mild interstitial coarsening. There is no edema, consolidation, effusion, or pneumothorax. Normal heart size and mediastinal contours. IMPRESSION: 1. Endotracheal tube with tip 10 mm above the carina. 2. Symmetric low volume lungs. Electronically Signed   By: Monte Fantasia M.D.   On: 08/04/2019 07:11   ECHOCARDIOGRAM COMPLETE  Result Date: 08/03/2019    ECHOCARDIOGRAM REPORT   Patient Name:   [name] Date of Exam: 08/03/2019 Medical Rec #:  [id number]      Height:       64.0 in Accession #:    [id number]     Weight:       159.2 lb Date of Birth:  09-20-1950      BSA:          1.78 m Patient Age:    59 years       BP:           143/64 mmHg Patient Gender: F              HR:           53 bpm. Exam Location:  Inpatient Procedure: 2D Echo, Cardiac Doppler and Color Doppler Indications:    Stroke 434.91  History:        Patient has no prior history of Echocardiogram examinations.                 Stroke, Arrythmias:LBBB; Risk Factors:Hypertension, Dyslipidemia                 and Current Smoker. GERD.  Sonographer:    Vickie Epley RDCS Referring Phys: [id number]  1. Severely depressed LVEF with global hypokinesis. No apical thrombus visualized on this study, however would consider repeat limited echo with contrast to exclude LV thrombus. Could also consider TEE for further evaluation of cardiac embolism.  2. Left ventricular ejection fraction, by estimation, is 25 to 30%. The left ventricle has severely decreased function. The left ventricle  demonstrates global hypokinesis. There is moderate concentric left ventricular hypertrophy. Left ventricular diastolic parameters are indeterminate. Elevated left ventricular end-diastolic pressure.  3. Right ventricular systolic function is normal. The right ventricular size is normal.  4. The mitral valve is normal in structure and function. Mild mitral valve regurgitation.  5. The aortic valve is tricuspid. Aortic valve regurgitation is not visualized. No aortic stenosis is present.  6. The inferior vena cava is dilated in size with <50% respiratory variability, suggesting right atrial pressure of 15 mmHg. FINDINGS  Left Ventricle: Left ventricular ejection fraction, by estimation, is 25 to 30%. The left ventricle has severely decreased function. The left ventricle demonstrates global hypokinesis. The left ventricular internal cavity size was normal in size. There is moderate concentric left ventricular hypertrophy. Left ventricular diastolic parameters are indeterminate. Elevated left ventricular end-diastolic pressure. Right Ventricle: The right ventricular size is normal. No increase in right ventricular wall thickness. Right ventricular systolic function is  normal. Left Atrium: Left atrial size was normal in size. Right Atrium: Right atrial size was normal in size. Pericardium: There is no evidence of pericardial effusion. Mitral Valve: The mitral valve is normal in structure and function. Mild mitral valve regurgitation. Tricuspid Valve: The tricuspid valve is normal in structure. Tricuspid valve regurgitation is trivial. No evidence of tricuspid stenosis. Aortic Valve: The aortic valve is tricuspid. Aortic valve regurgitation is not visualized. No aortic stenosis is present. Pulmonic Valve: The pulmonic valve was not well visualized. Pulmonic valve regurgitation is not visualized. No evidence of pulmonic stenosis. Aorta: The aortic root and ascending aorta are structurally normal, with no evidence of  dilitation. Venous: The inferior vena cava is dilated in size with less than 50% respiratory variability, suggesting right atrial pressure of 15 mmHg. IAS/Shunts: No atrial level shunt detected by color flow Doppler.  LEFT VENTRICLE PLAX 2D LVIDd:         4.51 cm      Diastology LVIDs:         3.70 cm      LV e' lateral:   3.57 cm/s LV PW:         1.59 cm      LV E/e' lateral: 15.4 LV IVS:        1.47 cm      LV e' medial:    2.61 cm/s LVOT diam:     2.00 cm      LV E/e' medial:  21.1 LV SV:         66.60 ml LV SV Index:   19.08 LVOT Area:     3.14 cm  LV Volumes (MOD) LV vol d, MOD A2C: 208.0 ml LV vol d, MOD A4C: 200.0 ml LV vol s, MOD A2C: 123.0 ml LV vol s, MOD A4C: 132.0 ml LV SV MOD A2C:     85.0 ml LV SV MOD A4C:     200.0 ml LV SV MOD BP:      77.0 ml RIGHT VENTRICLE RV S prime:     8.32 cm/s TAPSE (M-mode): 2.4 cm LEFT ATRIUM             Index       RIGHT ATRIUM           Index LA diam:        3.40 cm 1.92 cm/m  RA Area:     10.80 cm LA Vol (A2C):   52.9 ml 29.80 ml/m RA Volume:   26.70 ml  15.04 ml/m LA Vol (A4C):   41.7 ml 23.49 ml/m LA Biplane Vol: 48.8 ml 27.49 ml/m  AORTIC VALVE LVOT Vmax:   96.00 cm/s LVOT Vmean:  57.600 cm/s LVOT VTI:    0.212 m  AORTA Ao Root diam: 3.10 cm MITRAL VALVE MV Area (PHT): 2.05 cm     SHUNTS MV Decel Time: 370 msec     Systemic VTI:  0.21 m MV E velocity: 55.10 cm/s   Systemic Diam: 2.00 cm MV A velocity: 125.00 cm/s MV E/A ratio:  0.44 Buford Dresser MD Electronically signed by Buford Dresser MD Signature Date/Time: 08/03/2019/8:56:44 PM    Final    ECHOCARDIOGRAM LIMITED BUBBLE STUDY  Result Date: 08/04/2019    ECHOCARDIOGRAM LIMITED REPORT   Patient Name:   [name] Date of Exam: 08/04/2019 Medical Rec #:  [id number]      Height:       64.0 in Accession #:    [id number]  Weight:       159.0 lb Date of Birth:  1951/01/15      BSA:          1.77 m Patient Age:    78 years       BP:           96/63 mmHg Patient Gender: F              HR:            121 bpm. Exam Location:  Inpatient Procedure: Limited Echo, Color Doppler, Cardiac Doppler, Saline Contrast Bubble            Study and Intracardiac Opacification Agent STAT ECHO Indications:    Cardiac Arrest, Stroke  History:        Patient has prior history of Echocardiogram examinations, most                 recent 08/03/2019. Risk Factors:Hypertension and Dyslipidemia.  Sonographer:    Raquel Sarna Senior RDCS Referring Phys: [id number] Candee Furbish  Sonographer Comments: Limited to assess for cardiac source of thrombus or shunting. IMPRESSIONS  1. Left ventricular ejection fraction, by estimation, is 25 to 30%. The left ventricle has severely decreased function. The left ventricle demonstrates global hypokinesis. There was marked septal-lateral dyssynchrony consistent with LBBB. No LV thrombus  noted on Definity study.  2. Right ventricular systolic function is normal. The right ventricular size is normal. There is normal pulmonary artery systolic pressure.  3. The aortic valve is tricuspid. Aortic valve regurgitation is not visualized. No aortic stenosis is present.  4. The inferior vena cava is normal in size with <50% respiratory variability, suggesting right atrial pressure of 8 mmHg.  5. Bubble study appeared negative for shunt lesion but was a difficult study due to poor windows.  6. Limited echo. FINDINGS  Left Ventricle: Left ventricular ejection fraction, by estimation, is 25 to 30%. The left ventricle has severely decreased function. The left ventricle demonstrates global hypokinesis. Definity contrast agent was given IV to delineate the left ventricular endocardial borders. The left ventricular internal cavity size was normal in size. There is no left ventricular hypertrophy. Abnormal (paradoxical) septal motion, consistent with left bundle branch block. Right Ventricle: The right ventricular size is normal. No increase in right ventricular wall thickness. Right ventricular systolic function is  normal. There is normal pulmonary artery systolic pressure. The tricuspid regurgitant velocity is 2.21 m/s, and  with an assumed right atrial pressure of 8 mmHg, the estimated right ventricular systolic pressure is 67.8 mmHg. Left Atrium: Left atrial size was normal in size. Right Atrium: Right atrial size was normal in size. Tricuspid Valve: The tricuspid valve is normal in structure. Tricuspid valve regurgitation is trivial. Aortic Valve: The aortic valve is tricuspid. Aortic valve regurgitation is not visualized. No aortic stenosis is present. Pulmonic Valve: The pulmonic valve was normal in structure. Pulmonic valve regurgitation is not visualized. Venous: The inferior vena cava is normal in size with less than 50% respiratory variability, suggesting right atrial pressure of 8 mmHg. IAS/Shunts: Agitated saline contrast was given intravenously to evaluate for intracardiac shunting.  RIGHT VENTRICLE RV S prime:     9.14 cm/s TAPSE (M-mode): 1.9 cm AORTIC VALVE LVOT Vmax:   75.00 cm/s LVOT Vmean:  53.300 cm/s LVOT VTI:    0.142 m TRICUSPID VALVE TR Peak grad:   19.5 mmHg TR Vmax:        221.00 cm/s  SHUNTS Systemic VTI: 0.14 m Dalton  Mclean MD Electronically signed by Loralie Champagne MD Signature Date/Time: 08/04/2019/10:47:14 AM    Final    VAS US CAROTID (at [facility] and [facility] only)  Result Date: 08/03/2019 Carotid Arterial Duplex Study Indications: CVA. Performing Technologist: Maudry Mayhew MHA, RDMS, RVT, RDCS  Examination Guidelines: A complete evaluation includes B-mode imaging, spectral Doppler, color Doppler, and power Doppler as needed of all accessible portions of each vessel. Bilateral testing is considered an integral part of a complete examination. Limited examinations for reoccurring indications may be performed as noted.  Right Carotid Findings: +----------+--------+--------+--------+--------------------------+--------+           PSV cm/sEDV cm/sStenosisPlaque Description        Comments  +----------+--------+--------+--------+--------------------------+--------+ CCA Prox  74      17                                                 +----------+--------+--------+--------+--------------------------+--------+ CCA Distal74      12              heterogenous and smooth            +----------+--------+--------+--------+--------------------------+--------+ ICA Prox  55      17              heterogenous and irregular         +----------+--------+--------+--------+--------------------------+--------+ ICA Distal84      26                                                 +----------+--------+--------+--------+--------------------------+--------+ ECA       111     11              smooth and heterogenous            +----------+--------+--------+--------+--------------------------+--------+ +----------+--------+-------+----------------+-------------------+           PSV cm/sEDV cmsDescribe        Arm Pressure (mmHG) +----------+--------+-------+----------------+-------------------+ [id number]            Multiphasic, WNL                    +----------+--------+-------+----------------+-------------------+ +---------+--------+--+--------+-+---------+ VertebralPSV cm/s27EDV cm/s7Antegrade +---------+--------+--+--------+-+---------+  Left Carotid Findings: +----------+--------+--------+--------+-----------------------+--------+           PSV cm/sEDV cm/sStenosisPlaque Description     Comments +----------+--------+--------+--------+-----------------------+--------+ CCA Prox  74      12              smooth and heterogenous         +----------+--------+--------+--------+-----------------------+--------+ CCA Distal81      18                                              +----------+--------+--------+--------+-----------------------+--------+ ICA Prox  91      15              smooth and heterogenous          +----------+--------+--------+--------+-----------------------+--------+ ICA Distal66      13                                              +----------+--------+--------+--------+-----------------------+--------+  ECA       98      10              smooth and heterogenous         +----------+--------+--------+--------+-----------------------+--------+ +----------+--------+--------+----------------+-------------------+           PSV cm/sEDV cm/sDescribe        Arm Pressure (mmHG) +----------+--------+--------+----------------+-------------------+ [id number]             Multiphasic, WNL                    +----------+--------+--------+----------------+-------------------+ +---------+--------+--+--------+--+---------+ VertebralPSV cm/s59EDV cm/s13Antegrade +---------+--------+--+--------+--+---------+   Summary: Right Carotid: Velocities in the right ICA are consistent with a 1-39% stenosis. Left Carotid: Velocities in the left ICA are consistent with a 1-39% stenosis. Vertebrals:  Bilateral vertebral arteries demonstrate antegrade flow. Subclavians: Normal flow hemodynamics were seen in bilateral subclavian              arteries. *See table(s) above for measurements and observations.  Electronically signed by Antony Contras MD on 08/03/2019 at 5:14:31 PM.    Final    CT Maxillofacial Wo Contrast  Result Date: 08/02/2019 CLINICAL DATA:  Transient ischemic attack (TIA). Maxillofacial pain. Additional history provided: Patient presents for left-sided facial pressure and dizziness, pressure feeling radiates to left side of face and to ear. EXAM: CT HEAD WITHOUT CONTRAST CT MAXILLOFACIAL WITHOUT CONTRAST TECHNIQUE: Contiguous axial images were obtained from the base of the skull through the vertex without intravenous contrast. Multidetector CT imaging of the maxillofacial structures was performed. Multiplanar CT image reconstructions were also generated. A small metallic BB was placed on  the right temple in order to reliably differentiate right from left. COMPARISON:  No pertinent prior studies available for comparison. FINDINGS: CT HEAD FINDINGS Brain: Streak artifact limits evaluation of the inferomedial cerebellum. There is no evidence of acute intracranial hemorrhage. No demarcated cortical infarction. No evidence of intracranial mass. No midline shift or extra-axial fluid collection. There is a small region of ill-defined hypodensity the right frontal lobe white matter (series 4, images 20-24). Age-indeterminate lacunar infarct within the right lentiform nucleus/external capsule (series 4, images 16 and 17). Small chronic lacunar infarct within the right caudate. 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 Mild generalized parenchymal atrophy. Vascular: No hyperdense vessel.  Atherosclerotic calcifications Skull: Sequela of prior posterior fossa decompression, including a postsurgical defect within the C1 posterior arch. No calvarial fracture or aggressive osseous lesion. CT MAXILLOFACIAL FINDINGS Osseous: No maxillofacial fracture. Small bony exostosis along the superficial aspect of the posterior right mandibular body (series 5, image 29). Orbits: No abnormality identified. Sinuses: No significant paranasal sinus disease. Soft tissues: Carotid artery calcified plaque. The visualized maxillofacial and upper neck soft tissues are otherwise unremarkable. Streak artifact from dental restoration significant limits evaluation of the oral cavity and also somewhat limits evaluation of the oropharynx. Within this limitation, there is no appreciable mass or swelling within the oral cavity, pharynx or larynx. Other: The temporomandibular joints are unremarkable. Left mastoid effusion. IMPRESSION: CT head: 1. Age-indeterminate lacunar infarct within the right lentiform nucleus/external capsule. 2. Small focus of ill-defined hypoattenuation within the right frontal lobe subcortical  white matter which may reflect chronic ischemic change. A recent white matter infarct at this site cannot be excluded. 3. Chronic lacunar infarct within the right caudate. 4. 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 5. Mild generalized parenchymal atrophy. CT maxillofacial: 1. Streak artifact from dental restoration limits evaluation of the oral cavity and oropharynx.  Within this limitation, no swelling or discrete mass is appreciated within the oral cavity or pharynx. 2. Small left mastoid effusion. Electronically Signed   By: Kellie Simmering DO   On: 08/02/2019 18:50   Assessment and Plan:   1. 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  She Nash Bolls need further optimal medical therapy with likely carvedilol, Entresto, and Aldactone.  This can be titrated by her primary cardiologist. 2. Torsades de Pointes: Interestingly enough, had a prolonged QT interval prior to going into torsades.  This did unfortunately transition to ventricular fibrillation.  It is possible that with CVA and other intercranial issues the QT can lengthen which could be a contributing factor. She Khoury Siemon need optimal medical therapy for her acute heart failure.  Would plan for a LifeVest placement.  Should her ejection fraction improve, she may not need ICD therapy. 3. Paroxysmal atrial fibrillation: Found on monitoring after her cardiac arrest.  This could possibly be the cause of her acute stroke.  She Wynee Matarazzo need to be anticoagulated with Eliquis.  CHA2DS2-VASc of at least 4.      For questions or updates, please contact [facility] Please consult [email or web address] for contact info under     Signed, Codie Krogh Curt Bears, MD 08/04/2019 6:00 PM

## 2019-08-04 NOTE — Progress Notes (Signed)
PT Cancellation Note  Patient Details Name: RMONI KEPLINGER MRN: [id number] DOB: February 26, 1951   Cancelled Treatment:    Reason Eval/Treat Not Completed: Medical issues which prohibited therapy. Noted events of the morning, and transfer to ICU. Will hold this date and re-assess patient tomorrow as appropriate.    Thelma Comp 08/04/2019, 7:00 AM   Rolinda Roan, PT, DPT Acute Rehabilitation Services Pager: [phone] Office: [phone]

## 2019-08-04 NOTE — Progress Notes (Signed)
SLP Cancellation Note  Patient Details Name: [name] MRN: [id number] DOB: 04-02-1951   Cancelled treatment:        Pt now on ventilator and not appropriate for MBS. Will follow up.    Houston Siren 08/04/2019, 8:18 AM   Orbie Pyo Colvin Caroli.Ed Risk analyst [phone] Office [phone]

## 2019-08-04 NOTE — Progress Notes (Signed)
Patient with PAF and CHA2DS2-VASc Score is 6.  Yearly risk of stroke: 9.8% (F, A, HTN, CVA, CHF).  .  D/C Plavix and continue ASA 81 mg and Eliquis 5 mg BID. Cancel TEE for tomorrow. 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   Adrian Prows, MD, [facility] 08/04/2019, 10:20 PM [facility] [place] Office: [phone]

## 2019-08-04 NOTE — Progress Notes (Signed)
CRITICAL VALUE ALERT  Critical Value:  Lactic Acid 7.3  Date & Time Notied:  08/03/18 0750  Provider Notified: Dr. Erskine Emery  Orders Received/Actions taken:   MD Aware and will continue to monitor patient

## 2019-08-04 NOTE — Progress Notes (Signed)
eLink Physician-Brief Progress Note Patient Name: [name] DOB: May 13, 1951 MRN: [id number]   Date of Service  08/04/2019  HPI/Events of Note  Pt with cardiac arrest earlier this  Morning, transferred to 11M 15 after ROSC.  eICU Interventions  Patient was signed out to Dr. Erskine Emery by me.        [name] 08/04/2019, 7:00 AM

## 2019-08-04 NOTE — Progress Notes (Signed)
This chaplain responded to and was available for Pt. Code Blue with medical team.  The chaplain understands the Pt. transferred to [phone].  The Pt. RN-Kenneth will update family.  F/U spiritual care is available as needed.

## 2019-08-04 NOTE — Progress Notes (Signed)
OT Cancellation Note  Patient Details Name: [name] MRN: [id number] DOB: [birth year]   Cancelled Treatment:    Reason Eval/Treat Not Completed: Medical issues which prohibited therapy.  Noted medical events of the morning with transfer to ICU.  Will hold this date and re-assess patient tomorrow as appropriate.  Simonne Come 08/04/2019, 10:07 AM

## 2019-08-04 NOTE — Progress Notes (Signed)
TR BAND REMOVAL  LOCATION:    Radial rt radial  DEFLATED PER PROTOCOL:   yes  TIME BAND OFF / DRESSING APPLIED:    1600/gauze and tegaderm  SITE UPON ARRIVAL:    Level 0  SITE AFTER BAND REMOVAL:    Level 0, faint bruising at puncture site   CIRCULATION SENSATION AND MOVEMENT:    Within Normal Limits : rt hand and fingers pink, sensation present; rt arm continues to be elevated on pillow. Instructions reviewed w/patient  COMMENTS:

## 2019-08-04 NOTE — Progress Notes (Signed)
Pt's husband updated at [phone] via house number on file. He has received the unit's phone number to contact staff and pt's care nurse on that unit.

## 2019-08-04 NOTE — Consult Note (Signed)
CARDIOLOGY CONSULT NOTE  Patient ID: [name] MRN: [id number] DOB/AGE: [birth year] 69 y.o.  Admit date: 08/02/2019 Referring Physician  Ina Homes, MD Primary Physician:  Pleas Koch, NP Reason for Consultation  Cardiac arrest  Patient ID: [name], female    DOB: 1950-12-28, 69 y.o.   MRN: [id number]  CC: Cardiac arrest  HPI:    [name]  is a 69 y.o. Caucasian female with history of hypertension, hyperlipidemia, tobacco use disorder, history of Arnold-Chiari malformation, presenting with left-sided facial numbness and tingling that started suddenly on Monday morning and also started having "shakes", EMS was activated and brought to the emergency room.  She was found to have chronic strokes from previous and also new right basal ganglia stroke.  She was admitted to the hospital for further evaluation and was also found to have left bundle branch block.  Echocardiogram revealed severe LV systolic dysfunction.  Patient had an episode of sustained VT/VF and patient became pulseless, underwent ACLS protocol and CPR and was defibrillated and intubated.  However she was also successfully extubated upon arrival to the intensive care unit.  I was consulted in view of VF arrest and cardiomyopathy.  She is awake and able to give a history herself.  States that her left-sided weakness is improving.  No headache or visual disturbances.  She complains of chest pain at the CPR site.  Feels fatigued and weak.  Husband present.  On further questioning she states that she has been having chest tightness when she mops the floor or she is vacuuming the house and this has been ongoing for several months.  She also has occasional leg cramps with activity.  She feels her legs are always cold.   Past Medical History:  Diagnosis Date  . Allergy   . Anxiety   . Arnold-Chiari malformation ([place])   . Arthritis   . GERD (gastroesophageal reflux disease)   . H. pylori infection    3 years ago   . Hyperlipidemia   . Incontinence   . Syncope and collapse    Per pt, denies passing out  . Tobacco use disorder   . Unspecified essential hypertension    Past Surgical History:  Procedure Laterality Date  . APPENDECTOMY    . ARNOLD CHIARI SURGERY     neurocranial surgery  . BLEPHAROPLASTY     Bil  . C-EYE SURGERY PROCEDURE    . CHOLECYSTECTOMY    . EYE SURGERY    . MASS EXCISION Right 12/27/2013   Procedure: MINOR EXCISION OF RIGHT THUMB MUCOID CYST, DEBRIDEMENT OF INTERPHALANGEAL JOINT;  Surgeon: Cammie Sickle, MD;  Location: [place];  Service: Orthopedics;  Laterality: Right;  . mass on thumb  right  . TOTAL KNEE ARTHROPLASTY Right 02/17/2017  . TOTAL KNEE ARTHROPLASTY Right 02/17/2017   Procedure: TOTAL KNEE ARTHROPLASTY;  Surgeon: Melrose Nakayama, MD;  Location: [place];  Service: Orthopedics;  Laterality: Right;  . TUBAL LIGATION     Social History   Tobacco Use  . Smoking status: Current Every Day Smoker    Packs/day: 1.00    Years: 50.00    Pack years: 50.00    Types: Cigarettes  . Smokeless tobacco: Never Used  Substance Use Topics  . Alcohol use: No    Alcohol/week: 0.0 standard drinks    Family History  Problem Relation Age of Onset  . Bone cancer Mother   . Heart disease Mother   . Cancer Sister  metastatic; unknown primary  . Diverticulosis Sister   . Tuberculosis Father   . Diabetes Sister   . Hypertension Sister   . Colon cancer Neg Hx   . Esophageal cancer Neg Hx   . Pancreatic cancer Neg Hx   . Stomach cancer Neg Hx   . Liver disease Neg Hx   . Kidney disease Neg Hx   . Rectal cancer Neg Hx     ROS  Review of Systems  Cardiovascular: Positive for chest pain. Negative for dyspnea on exertion and leg swelling.  Gastrointestinal: Negative for melena.  Neurological: Positive for focal weakness (left arm and right facial).   Objective   Vitals with BMI 08/04/2019 08/04/2019 08/04/2019  Height - - -  Weight - - -   BMI - - -  Systolic 097 353 299  Diastolic 66 65 66  Pulse 63 63 66    Blood pressure 122/66, pulse 63, temperature 98.1 F (36.7 C), temperature source Oral, resp. rate 19, height 5\' 4"  (1.626 m), weight 72.1 kg, SpO2 97 %.    Physical Exam  Constitutional: She is oriented to person, place, and time.  Cardiovascular: Normal rate, regular rhythm, normal heart sounds and intact distal pulses. Exam reveals no gallop.  No murmur heard. Pulses:      Carotid pulses are 2+ on the right side and 2+ on the left side.      Femoral pulses are 1+ on the right side and 0 on the left side.      Popliteal pulses are 1+ on the right side and 0 on the left side.       Dorsalis pedis pulses are 0 on the right side and 0 on the left side.       Posterior tibial pulses are 0 on the right side and 0 on the left side.  No leg edema, no JVD.  Pulmonary/Chest: Effort normal and breath sounds normal.  Chest wall tenderness  Abdominal: Soft. Bowel sounds are normal.  Neurological: She is alert and oriented to person, place, and time.  Skin: Skin is warm and dry.   Laboratory examination:    Recent Labs    08/03/19 1000 08/04/19 0209 08/04/19 0652  NA 143 143 143  K 3.9 3.9 3.2*  CL 107 107 110  CO2 25 26 17*  GLUCOSE 103* 95 226*  BUN 9 10 11   CREATININE 1.04* 1.10* 1.34*  CALCIUM 8.8* 9.1 8.6*  GFRNONAA 55* 51* 40*  GFRAA >60 59* 47*   estimated creatinine clearance is 38.6 mL/min (A) (by C-G formula based on SCr of 1.34 mg/dL (H)).  CMP Latest Ref Rng & Units 08/04/2019 08/04/2019 08/03/2019  Glucose 70 - 99 mg/dL 226(H) 95 103(H)  BUN 8 - 23 mg/dL 11 10 9   Creatinine 0.44 - 1.00 mg/dL 1.34(H) 1.10(H) 1.04(H)  Sodium 135 - 145 mmol/L 143 143 143  Potassium 3.5 - 5.1 mmol/L 3.2(L) 3.9 3.9  Chloride 98 - 111 mmol/L 110 107 107  CO2 22 - 32 mmol/L 17(L) 26 25  Calcium 8.9 - 10.3 mg/dL 8.6(L) 9.1 8.8(L)  Total Protein 6.5 - 8.1 g/dL 6.2(L) - -  Total Bilirubin 0.3 - 1.2 mg/dL 0.9 - -   Alkaline Phos 38 - 126 U/L 77 - -  AST 15 - 41 U/L 183(H) - -  ALT 0 - 44 U/L 101(H) - -   CBC Latest Ref Rng & Units 08/04/2019 08/04/2019 08/03/2019  WBC 4.0 - 10.5 K/uL 23.2(H) 6.9 6.6  Hemoglobin 12.0 - 15.0 g/dL 14.6 13.7 14.3  Hematocrit 36.0 - 46.0 % 45.4 41.9 43.3  Platelets 150 - 400 K/uL 254 224 251   Lipid Panel     Component Value Date/Time   CHOL 232 (H) 08/03/2019 0249   TRIG 128 08/03/2019 0249   HDL 31 (L) 08/03/2019 0249   CHOLHDL 7.5 08/03/2019 0249   VLDL 26 08/03/2019 0249   LDLCALC 175 (H) 08/03/2019 0249   LDLDIRECT 193.0 09/02/2016 0839   HEMOGLOBIN A1C Lab Results  Component Value Date   HGBA1C 5.7 (H) 08/03/2019   MPG 116.89 08/03/2019   TSH No results for input(s): TSH in the last 8760 hours. BNP (last 3 results) No results for input(s): BNP in the last 8760 hours.  Medications and allergies   Allergies  Allergen Reactions  . Shellfish-Derived Products Anaphylaxis  . Ativan [Lorazepam] Other (See Comments)    Makes "skin crawl" and insomnia  . Buspar [Buspirone] Nausea Only    Sweating and dizzy  . Clarithromycin Swelling  . Codeine Nausea And Vomiting  . Doxycycline Other (See Comments)    Unknown  . Guaifenesin Other (See Comments)    Palpitations  . Oxycodone Nausea And Vomiting  . Prednisone Nausea And Vomiting and Other (See Comments)    Makes my heart race  . Statins Other (See Comments)    Muscle pain  . Sulfonamide Derivatives Nausea And Vomiting    Achiness  . Tetracycline Nausea Only  . Vicodin [Hydrocodone-Acetaminophen] Nausea And Vomiting  . Famotidine Rash  . Latex Rash  . Omeprazole Nausea Only    Cough, shortness of breath - patient doesn't remember  . Peanut-Containing Drug Products Itching and Rash  . Wellbutrin [Bupropion] Palpitations     Prior to Admission medications   Medication Sig Start Date End Date Taking? Authorizing Provider  acetaminophen (TYLENOL) 500 MG tablet Take 1,000 mg by mouth 2 (two)  times daily as needed (pain).   Yes [provider]  amLODipine-benazepril (LOTREL) 5-20 MG capsule Take 1 capsule by mouth daily. For blood pressure. 12/13/18  Yes Pleas Koch, NP  docusate sodium (COLACE) 100 MG capsule Take 100 mg by mouth daily as needed for mild constipation.   Yes [provider]  loratadine (CLARITIN) 10 MG tablet Take 10 mg by mouth daily as needed for allergies.    Yes [provider]    . sodium chloride    . amiodarone Stopped (08/04/19 0945)  . amiodarone    . dexmedetomidine (PRECEDEX) IV infusion Stopped (08/04/19 9024)    Current Outpatient Medications  Medication Instructions  . acetaminophen (TYLENOL) 1,000 mg, Oral, 2 times daily PRN  . amLODipine-benazepril (LOTREL) 5-20 MG capsule 1 capsule, Oral, Daily, For blood pressure.  . docusate sodium (COLACE) 100 mg, Oral, Daily PRN  . loratadine (CLARITIN) 10 mg, Oral, Daily PRN    I/O last 3 completed shifts: In: 680 [I.V.:180; IV Piggyback:500] Out: -  Total I/O In: 8.6 [I.V.:8.6] Out: -     Radiology:   CT of the head without contrast 08/04/2019: MPRESSION: 1. No acute finding when compared to MRI 2 days ago. 2. Recent right basal ganglia perforator infarct without hemorrhage or visible progression. Electronically Signed   By: Monte Fantasia M.D.   On: 08/04/2019 11:04   CT of the head with contrast 08/02/2019: : 1. Age-indeterminate lacunar infarct within the right lentiform nucleus/external capsule. 2. Small focus of ill-defined hypoattenuation within the right frontal lobe subcortical white matter which  may reflect chronic ischemic change. A recent white matter infarct at this site cannot be excluded. 3. Chronic lacunar infarct within the right caudate. 4. 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 5. Mild generalized parenchymal atrophy. CT maxillofacial: 1. Streak artifact from dental restoration limits evaluation of the oral cavity and  oropharynx. Within this limitation, no swelling or discrete mass is appreciated within the oral cavity or pharynx. 2. Small left mastoid effusion. Electronically Signed   By: Kellie Simmering DO   On: 08/02/2019 18:50   MRI of the brain 08/02/2019: POSTERIOR CIRCULATION: --Vertebral arteries: Normal V4 segments. --Posterior inferior cerebellar arteries (PICA): Patent origins from the vertebral arteries. --Anterior inferior cerebellar arteries (AICA): Patent origins from the basilar artery. --Basilar artery: Normal. --Superior cerebellar arteries: Normal. --Posterior cerebral arteries: Normal. Both originate from the basilar artery. Posterior communicating arteries (p-comm) are diminutive or absent. ANTERIOR CIRCULATION: --Intracranial internal carotid arteries: Normal. --Anterior cerebral arteries (ACA): Normal. Both A1 segments are present. Patent anterior communicating artery (a-comm). --Middle cerebral arteries (MCA): Normal. IMPRESSION: 1. 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 No hemorrhage or mass effect. 2. Normal intracranial MRA. Electronically Signed   By: Ulyses Jarred M.D.   On: 08/02/2019 22:02   Chest x-ray 08/04/2019:  Result Date: 08/04/2019 CLINICAL DATA:  Respiratory arrest EXAM: PORTABLE CHEST 1 VIEW COMPARISON:  02/10/2017 FINDINGS: Endotracheal tube with tip 10 mm above the carina. Symmetric low volume lungs with mild interstitial coarsening. There is no edema, consolidation, effusion, or pneumothorax. Normal heart size and mediastinal contours. IMPRESSION: 1. Endotracheal tube with tip 10 mm above the carina. 2. Symmetric low volume lungs. Electronically Signed   By: Monte Fantasia M.D.   On: 08/04/2019 07:11  Cardiac Studies:   Echocardiogram 08/04/2019: 1. Left ventricular ejection fraction, by estimation, is 25 to 30%. The  left ventricle has severely decreased function. The left ventricle  demonstrates global hypokinesis. There was marked septal-lateral  dyssynchrony  consistent with LBBB. No LV thrombus  noted on Definity study.  2. Bubble study appeared negative for shunt lesion but was a difficult  study due to poor windows.   Carotid artery duplex  08/03/2019: Right Carotid: Velocities in the right ICA are consistent with a 1-39%  stenosis.  Left Carotid: Velocities in the left ICA are consistent with a 1-39%  stenosis.  Vertebrals: Bilateral vertebral arteries demonstrate antegrade flow.  Subclavians: Normal flow hemodynamics were seen in bilateral subclavian  arteries.   Assessment   1.  VF cardiac arrest, successful restoration. 2.  New onset LBBB, echocardiogram suggesting dilated cardiomyopathy with severe LV systolic dysfunction.  EKG was normal in 2018. 3.  Multiple strokes, old and also recent stroke 2 days ago, management per neurology.  Discussed with Dr. Leonie Man who agrees that anticoagulation if patient needs PCI is not an issue. 4.  Hypertension controlled 5.  Hyperlipidemia 6.  Peripheral arterial disease, she has absent pedal pulses and also very faint right femoral pulse and absent left femoral pulse. 7.  Tobacco use disorder  Recommendations:   Patient is stable from medical standpoint to proceed with cardiac catheterization.  I discussed the risk benefits and alternatives with the patient and her husband at the bedside, patient is willing to proceed.  No contraindication for anticoagulation or antiplatelet therapy per neurology.  She will need aggressive risk factor modification for hypertension and hyperlipidemia.  She will eventually also need evaluation for peripheral arterial disease and smoking cessation counseling.  Adrian Prows, MD, [facility] 08/04/2019, 11:58  AM [facility] Pager: [phone] Office: [phone]

## 2019-08-04 NOTE — Procedures (Signed)
Extubation Procedure Note  Patient Details:   Name: [name] DOB: Dec 14, 1950 MRN: [id number]   Airway Documentation:    Vent end date: 08/04/19 Vent end time: 0849   Evaluation  O2 sats: stable throughout Complications: No apparent complications Patient did tolerate procedure well. Bilateral Breath Sounds: Diminished   Yes  4l/min [place] Incentive spirometer 670ml  Revonda Standard 08/04/2019, 8:49 AM

## 2019-08-05 ENCOUNTER — Telehealth: Payer: Self-pay

## 2019-08-05 ENCOUNTER — Encounter (HOSPITAL_COMMUNITY)
Admission: EM | Disposition: A | Discharge status: Home Health | Payer: Self-pay | Source: Home / Self Care | Attending: Internal Medicine

## 2019-08-05 ENCOUNTER — Encounter (HOSPITAL_COMMUNITY): Payer: Self-pay | Admitting: Certified Registered"

## 2019-08-05 ENCOUNTER — Inpatient Hospital Stay (HOSPITAL_COMMUNITY): Payer: Medicare HMO

## 2019-08-05 ENCOUNTER — Encounter (HOSPITAL_COMMUNITY): Payer: Self-pay | Admitting: Family Medicine

## 2019-08-05 LAB — GLUCOSE, CAPILLARY
Glucose-Capillary: 96 mg/dL (ref 70–99)
Glucose-Capillary: 102 mg/dL — ABNORMAL HIGH (ref 70–99)
Glucose-Capillary: 131 mg/dL — ABNORMAL HIGH (ref 70–99)
Glucose-Capillary: 136 mg/dL — ABNORMAL HIGH (ref 70–99)
Glucose-Capillary: 139 mg/dL — ABNORMAL HIGH (ref 70–99)
Glucose-Capillary: 128 mg/dL — ABNORMAL HIGH (ref 70–99)

## 2019-08-05 LAB — COMPREHENSIVE METABOLIC PANEL
ALT: 62 U/L — ABNORMAL HIGH (ref 0–44)
AST: 58 U/L — ABNORMAL HIGH (ref 15–41)
Albumin: 3.4 g/dL — ABNORMAL LOW (ref 3.5–5.0)
Alkaline Phosphatase: 71 U/L (ref 38–126)
Anion gap: 9 (ref 5–15)
BUN: 17 mg/dL (ref 8–23)
CO2: 22 mmol/L (ref 22–32)
Calcium: 8.9 mg/dL (ref 8.9–10.3)
Chloride: 110 mmol/L (ref 98–111)
Creatinine, Ser: 1.15 mg/dL — ABNORMAL HIGH (ref 0.44–1.00)
GFR calc Af Amer: 56 mL/min — ABNORMAL LOW (ref >60)
GFR calc non Af Amer: 49 mL/min — ABNORMAL LOW (ref >60)
Glucose, Bld: 104 mg/dL — ABNORMAL HIGH (ref 70–99)
Potassium: 3.8 mmol/L (ref 3.5–5.1)
Sodium: 141 mmol/L (ref 135–145)
Total Bilirubin: 1.2 mg/dL (ref 0.3–1.2)
Total Protein: 6.3 g/dL — ABNORMAL LOW (ref 6.5–8.1)

## 2019-08-05 LAB — BASIC METABOLIC PANEL
Anion gap: 13 (ref 5–15)
BUN: 17 mg/dL (ref 8–23)
CO2: 20 mmol/L — ABNORMAL LOW (ref 22–32)
Calcium: 8.8 mg/dL — ABNORMAL LOW (ref 8.9–10.3)
Chloride: 110 mmol/L (ref 98–111)
Creatinine, Ser: 1.21 mg/dL — ABNORMAL HIGH (ref 0.44–1.00)
GFR calc Af Amer: 53 mL/min — ABNORMAL LOW (ref >60)
GFR calc non Af Amer: 46 mL/min — ABNORMAL LOW (ref >60)
Glucose, Bld: 111 mg/dL — ABNORMAL HIGH (ref 70–99)
Potassium: 4.1 mmol/L (ref 3.5–5.1)
Sodium: 143 mmol/L (ref 135–145)

## 2019-08-05 LAB — CBC
HCT: 39.7 % (ref 36.0–46.0)
Hemoglobin: 13.2 g/dL (ref 12.0–15.0)
MCH: 29.9 pg (ref 26.0–34.0)
MCHC: 33.2 g/dL (ref 30.0–36.0)
MCV: 89.8 fL (ref 80.0–100.0)
Platelets: 200 10*3/uL (ref 150–400)
RBC: 4.42 MIL/uL (ref 3.87–5.11)
RDW: 13.4 % (ref 11.5–15.5)
WBC: 9.7 10*3/uL (ref 4.0–10.5)
nRBC: 0 % (ref 0.0–0.2)

## 2019-08-05 LAB — MAGNESIUM: Magnesium: 2.4 mg/dL (ref 1.7–2.4)

## 2019-08-05 SURGERY — CANCELLED PROCEDURE

## 2019-08-05 MED ORDER — INSULIN ASPART 100 UNIT/ML ~~LOC~~ SOLN
0.0000 [IU] | Freq: Three times a day (TID) | SUBCUTANEOUS | Status: DC
  Administered 2019-08-05 (×2): 2 [IU] via SUBCUTANEOUS

## 2019-08-05 MED ORDER — PANTOPRAZOLE SODIUM 40 MG PO TBEC
40.0000 mg | DELAYED_RELEASE_TABLET | Freq: Every day | ORAL | Status: DC
  Administered 2019-08-05 – 2019-08-06 (×2): 40 mg via ORAL
  Filled 2019-08-05 (×2): qty 1

## 2019-08-05 MED ORDER — SODIUM CHLORIDE 0.9 % IV SOLN: 250.0000 mL | INTRAVENOUS | Status: DC | PRN

## 2019-08-05 MED FILL — Lidocaine HCl Local Preservative Free (PF) Inj 1%: INTRAMUSCULAR | Qty: 30 | Status: AC

## 2019-08-05 NOTE — Discharge Instructions (Signed)

## 2019-08-05 NOTE — Telephone Encounter (Signed)
Per Dr. Einar Gip call pt for [facility] appt scheduled for 08/12/2019

## 2019-08-05 NOTE — Progress Notes (Signed)
69 yo female with Arnold-Chiari malformation was admitted for basal ganglia stroke 2/16 had vfib/torsades de pointes cardiac arrest 2/18. She was successfully resuscitated in 15 minutes and transferred to ICU. She was extubated in ICU shortly thereafter and was observed for 24 hours. She has done well. Patient was seen by Cardiology and was noted to have new LBBB. Echo reveals dilated cardiomyopathy with EF 25-30%. There is a suspicion that her stroke may have been embolic in nature. Cardiac cath revealed 70-80% RCA lesion that does not appear unstable and she was diagnosed with nonischemic cardiomyopathy. She was subsequently noted to have Paroxysmal Afib, ChadsVasc2 score 6.  She was started on Eliquis and carvedilol. She will be transferred to stepdown tomorrow for resumption of care by [facility]. She is being actively followed by Cardiology.

## 2019-08-05 NOTE — Progress Notes (Signed)
Subjective:  Feels much better today, still has mild facial weakness.   Intake/Output from previous day:  I/O last 3 completed shifts: In: 628.6 [P.O.:30; I.V.:598.6] Out: 510 [Urine:510] Total I/O In: 130 [P.O.:120; I.V.:10] Out: 200 [Urine:200]  Blood pressure 121/65, pulse 71, temperature 98.5 F (36.9 C), temperature source Oral, resp. rate 13, height 5' 4" (1.626 m), weight 72.1 kg, SpO2 91 %. Physical Exam  Cardiovascular: Normal rate, regular rhythm, normal heart sounds and intact distal pulses. Exam reveals no gallop.  No murmur heard. Pulses:      Carotid pulses are on the right side with bruit.      Femoral pulses are 1+ on the right side and 0 on the left side.      Popliteal pulses are 0 on the right side and 0 on the left side.       Dorsalis pedis pulses are 0 on the right side and 0 on the left side.       Posterior tibial pulses are 0 on the right side and 0 on the left side.  No leg edema, no JVD.  Pulmonary/Chest: Effort normal and breath sounds normal.  Abdominal: Soft. Bowel sounds are normal.  Skin: Skin is warm and dry.   Lab Results: BMP BNP (last 3 results) No results for input(s): BNP in the last 8760 hours.  ProBNP (last 3 results) No results for input(s): PROBNP in the last 8760 hours. BMP Latest Ref Rng & Units 08/05/2019 08/04/2019 08/04/2019  Glucose 70 - 99 mg/dL 111(H) 226(H) 95  BUN 8 - 23 mg/dL _0 Creatinine 0.44 - 1.00 mg/dL 1.21(H) 1.34(H) 1.10(H)  Sodium 135 - 145 mmol/L 143 143 143  Potassium 3.5 - 5.1 mmol/L 4.1 3.2(L) 3.9  Chloride 98 - 111 mmol/L 110 110 107  CO2 22 - 32 mmol/L 20(L) 17(L) 26  Calcium 8.9 - 10.3 mg/dL 8.8(L) 8.6(L) 9.1   Hepatic Function Latest Ref Rng & Units 08/04/2019 12/09/2018 09/16/2017  Total Protein 6.5 - 8.1 g/dL 6.2(L) 6.6 7.2  Albumin 3.5 - 5.0 g/dL 3.5 4.2 4.1  AST 15 - 41 U/L 183(H) 15 17  ALT 0 - 44 U/L 101(H) 9 12  Alk Phosphatase 38 - 126 U/L 77 89 84  Total Bilirubin 0.3 - 1.2 mg/dL 0.9 0.5  0.5   CBC Latest Ref Rng & Units 08/05/2019 08/04/2019 08/04/2019  WBC 4.0 - 10.5 K/uL 9.7 23.2(H) 6.9  Hemoglobin 12.0 - 15.0 g/dL 13.2 14.6 13.7  Hematocrit 36.0 - 46.0 % 39.7 45.4 41.9  Platelets 150 - 400 K/uL 200 254 224   Lipid Panel     Component Value Date/Time   CHOL 232 (H) 08/03/2019 0249   TRIG 128 08/03/2019 0249   HDL 31 (L) 08/03/2019 0249   CHOLHDL 7.5 08/03/2019 0249   VLDL 26 08/03/2019 0249   LDLCALC 175 (H) 08/03/2019 0249   LDLDIRECT 193.0 09/02/2016 0839   Cardiac Panel (last 3 results) No results for input(s): CKTOTAL, CKMB, TROPONINI, RELINDX in the last 72 hours.  HEMOGLOBIN A1C Lab Results  Component Value Date   HGBA1C 5.7 (H) 08/03/2019   MPG 116.89 08/03/2019   TSH No results for input(s): TSH in the last 8760 hours.  CT of the head without contrast 08/04/2019: MPRESSION: 1. No acute finding when compared to MRI 2 days ago. 2. Recent right basal ganglia perforator infarct without hemorrhage or visible progression. Electronically Signed   By: Monte Fantasia M.D.   On: 08/04/2019 11:04  CT of the head with contrast 08/02/2019: : 1. Age-indeterminate lacunar infarct within the right lentiform nucleus/external capsule. 2. Small focus of ill-defined hypoattenuation within the right frontal lobe subcortical white matter which may reflect chronic ischemic change. A recent white matter infarct at this site cannot be excluded. 3. Chronic lacunar infarct within the right caudate. 4. 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 5. Mild generalized parenchymal atrophy. CT maxillofacial: 1. Streak artifact from dental restoration limits evaluation of the oral cavity and oropharynx. Within this limitation, no swelling or discrete mass is appreciated within the oral cavity or pharynx. 2. Small left mastoid effusion. Electronically Signed   By: Kellie Simmering DO   On: 08/02/2019 18:50   MRI of the brain 08/02/2019: POSTERIOR CIRCULATION: --Vertebral  arteries: Normal V4 segments. --Posterior inferior cerebellar arteries (PICA): Patent origins from the vertebral arteries. --Anterior inferior cerebellar arteries (AICA): Patent origins from the basilar artery. --Basilar artery: Normal. --Superior cerebellar arteries: Normal. --Posterior cerebral arteries: Normal. Both originate from the basilar artery. Posterior communicating arteries (p-comm) are diminutive or absent. ANTERIOR CIRCULATION: --Intracranial internal carotid arteries: Normal. --Anterior cerebral arteries (ACA): Normal. Both A1 segments are present. Patent anterior communicating artery (a-comm). --Middle cerebral arteries (MCA): Normal. IMPRESSION: 1. 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 No hemorrhage or mass effect. 2. Normal intracranial MRA. Electronically Signed   By: Ulyses Jarred M.D.   On: 08/02/2019 22:02   Chest x-ray 08/04/2019:  Result Date: 08/04/2019 CLINICAL DATA:  Respiratory arrest EXAM: PORTABLE CHEST 1 VIEW COMPARISON:  02/10/2017 FINDINGS: Endotracheal tube with tip 10 mm above the carina. Symmetric low volume lungs with mild interstitial coarsening. There is no edema, consolidation, effusion, or pneumothorax. Normal heart size and mediastinal contours. IMPRESSION: 1. Endotracheal tube with tip 10 mm above the carina. 2. Symmetric low volume lungs. Electronically Signed   By: Monte Fantasia M.D.   On: 08/04/2019 07:11  Cardiac Studies:   Echocardiogram 08/04/2019: 1. Left ventricular ejection fraction, by estimation, is 25 to 30%. The  left ventricle has severely decreased function. The left ventricle  demonstrates global hypokinesis. There was marked septal-lateral  dyssynchrony consistent with LBBB. No LV thrombus  noted on Definity study.  2. Bubble study appeared negative for shunt lesion but was a difficult  study due to poor windows.   Carotid artery duplex  08/03/2019: Right Carotid: Velocities in the right ICA are consistent with a 1-39%   stenosis.  Left Carotid: Velocities in the left ICA are consistent with a 1-39%  stenosis.  Vertebrals: Bilateral vertebral arteries demonstrate antegrade flow.  Subclavians: Normal flow hemodynamics were seen in bilateral subclavian  arteries.   Scheduled Meds: . apixaban  5 mg Oral BID  . aspirin  81 mg Per Tube Daily  . atorvastatin  10 mg Per Tube q1800  . carvedilol  6.25 mg Oral BID WC  . Chlorhexidine Gluconate Cloth  6 each Topical Daily  . ezetimibe  10 mg Per Tube Daily  . insulin aspart  0-15 Units Subcutaneous TID WC  . mouth rinse  15 mL Mouth Rinse q12n4p  . pantoprazole  40 mg Oral Q1200  . sodium chloride flush  3 mL Intravenous Q12H   Continuous Infusions: . sodium chloride     PRN Meds:.sodium chloride, acetaminophen **OR** acetaminophen (TYLENOL) oral liquid 160 mg/5 mL **OR** acetaminophen, bisacodyl, fentaNYL (SUBLIMAZE) injection, ondansetron (ZOFRAN) IV, Resource ThickenUp Clear, sodium chloride flush   Assessment   [name]  is a 69 y.o.  Caucasian female with history of hypertension, hyperlipidemia, tobacco use disorder, history of Arnold-Chiari malformation, presenting with left-sided facial numbness and tingling.  She was found to have chronic strokes from previous and also new right basal ganglia stroke.  She was admitted to the hospital for further evaluation and was also found to have left bundle branch block.  Echocardiogram revealed severe LV systolic dysfunction.  Patient had an episode of sustained VT/VF and patient became pulseless, underwent ACLS protocol and CPR and was defibrillated and intubated.  However she was also successfully extubated upon arrival to the intensive care unit.  Coronary angiogram yesterday did not reveal CAD.    1.  VF/TDP cardiac arrest, successful resuscitation to brief AFIB and then to NSR. 2.  New onset atypical LBBB, echocardiogram suggesting dilated cardiomyopathy with severe LV systolic dysfunction.  EKG was  normal in 2018. No significant CAD by coronary angiogram yesterday.  3.  Multiple strokes, old and also recent stroke 2 days ago, management per neurology.  Discussed with Dr. Leonie Man who agrees that anticoagulation if patient needs PCI is not an issue. 4.  Hypertension controlled 5.  Hyperlipidemia 6.  Peripheral arterial disease, she has absent pedal pulses and also very faint right femoral pulse and absent left femoral pulse. 7.  Tobacco use disorder  Rec: BP well controlled and tolerating COreg. Will check CMP and if LFT and Renal function stable, will initiate Entresto for cardiomyopathy and add aldactone.  Will need 40 or 80 mg Lipitor in view of marked hyperlipidemia. Aggressive risk factor modification in view of PAD.  Will be discharged home on LifeVest when ready for discharge.   Adrian Prows, M.D. 08/05/2019, 12:52 PM [facility]

## 2019-08-05 NOTE — Progress Notes (Signed)
  Speech Language Pathology Treatment: Dysphagia  Patient Details Name: [name] MRN: [id number] DOB: 1950-07-07 Today's Date: 08/05/2019 Time: 1000-1015 SLP Time Calculation (min) (ACUTE ONLY): 15 min  Assessment / Plan / Recommendation Clinical Impression  ST followed up for current swallow status. An MBS was recommended and to be performed 2/18 however that morning pt had Vfib, coded and intubated for several hours. Today pt seen with primarily thin liquids in which she continues to exhibit s/s aspiration. Strong coughs immediately and delayed following with several sips thin via straw and without. Will proceed with MBS today at 10:30.    HPI HPI: [name] is a 69 y.o. female with medical history significant for anxiety, hypertension, and Chiari I malformation, presented to the emergency department with difficulty swallowing, dizziness, and left facial pressure on 08/02/19. 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       SLP Plan  MBS       Recommendations  Diet recommendations: Dysphagia 3 (mechanical soft);Thin liquid Liquids provided via: Cup Medication Administration: Whole meds with puree Compensations: Minimize environmental distractions;Slow rate;Small sips/bites                Oral Care Recommendations: Oral care BID Follow up Recommendations: Other (comment)(TBD) SLP Visit Diagnosis: Dysphagia, unspecified (R13.10) Plan: MBS       GO                Houston Siren 08/05/2019, 1:09 PM

## 2019-08-05 NOTE — Telephone Encounter (Signed)
Location of hospitalization: [facility] Reason for hospitalization: Acute ischemic stroke  Date of discharge:  Date of first communication with patient:  Person contacting patient: Me Current symptoms:none Do you understand why you were in the Hospital: Yes Questions regarding discharge instructions: None Where were you discharged to: Home Medications reviewed: Yes Allergies reviewed: Yes Dietary changes reviewed: Yes. Discussed low fat and low salt diet.  Referals reviewed: NA Activities of Daily Living: Able to with mild limitations Any transportation issues/concerns: None Any patient concerns: None Confirmed importance & date/time of Follow up appt: Yes Confirmed with patient if condition begins to worsen call. Pt was given the office number and encouraged to call back with questions or concerns: Yes

## 2019-08-05 NOTE — Evaluation (Addendum)
Physical Therapy Re-Evaluation Patient Details Name: [name] MRN: [id number] DOB: 1950/11/29 Today's Date: 08/05/2019   History of Present Illness  Pt is a 69 y.o. female admitted 08/02/19 with dificulty swallowing, dizziness and L facial pressure. MRI notable for acute ischemic infarct in R basal ganglia. On 2/18, pt noted to be in vfib without pulse, shocked and went into torsades and PEA; transfer to ICU; brief intubation 2/18.  Head CT 2/18 without new acute finding. PMH includes anxiety, HTN, Chiari I malformation.    Clinical Impression  Pt seen for reevaluation post vfib arrest, brief intubation and transfer to ICU. Pt continues to mobilize well requiring intermittent minA for balance; limited by decreased activity tolerance, fatigue; demonstrates apparent slowed processing and some residual weakness but not significant change compared to initial PT evaluation 2/17. SpO2 maintaining 92-95% on RA. Educ on importance of IS use and frequency, pt able to demonstrate correct technique. Pt motivated to participate and return home. Will continue to follow acutely to address established goals.    Follow Up Recommendations No PT follow up;Supervision for mobility/OOB    Equipment Recommendations  None recommended by PT    Recommendations for Other Services       Precautions / Restrictions Precautions Precautions: Fall Restrictions Weight Bearing Restrictions: No      Mobility  Bed Mobility Overal bed mobility: Needs Assistance Bed Mobility: Rolling;Sidelying to Sit Rolling: Modified independent (Device/Increase time) Sidelying to sit: Min assist;HOB elevated       General bed mobility comments: Difficulty going from sidelying to sit with L-side lean requiring minA for trunk elevation and initial stabilization; pt reports, "The bed is leaning me that way"  Transfers Overall transfer level: Needs assistance Equipment used: 1 person hand held assist Transfers: Sit to/from  Stand Sit to Stand: Min assist         General transfer comment: MinA for HHA to elevate trunk and maintain balance; able to stand from bed, BSC and recliner  Ambulation/Gait Ambulation/Gait assistance: Min assist;Min guard Gait Distance (Feet): 12 Feet Assistive device: 1 person hand held assist;None Gait Pattern/deviations: Step-through pattern;Decreased stride length Gait velocity: Decreased   General Gait Details: Ambulatory in room with initial minA for HHA for stability, progressing to no UE support and min guard with forwards/backwards gait  Stairs            Wheelchair Mobility    Modified Rankin (Stroke Patients Only) Modified Rankin (Stroke Patients Only) Pre-Morbid Rankin Score: No symptoms Modified Rankin: Moderately severe disability     Balance Overall balance assessment: Needs assistance   Sitting balance-Leahy Scale: Fair       Standing balance-Leahy Scale: Fair Standing balance comment: Prolonged standing at sink without UE support to perform ADL tasks (washing face, washing hands, brushing teeth)                             Pertinent Vitals/Pain Pain Assessment: Faces Faces Pain Scale: Hurts little more Pain Location: Chest (likely s/p CPR) Pain Descriptors / Indicators: Discomfort;Guarding;Grimacing;Sore Pain Intervention(s): Limited activity within patient's tolerance;Monitored during session(bear hug for cough)    Home Living Family/patient expects to be discharged to:: Private residence Living Arrangements: Spouse/significant other Available Help at Discharge: Family;Available 24 hours/day Type of Home: House Home Access: Stairs to enter   [employer] of Steps: 3 Home Layout: One level Home Equipment: Walker - 2 wheels;Bedside commode;Shower seat Additional Comments: 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  Prior Function Level of Independence: Independent         Comments: Retired hairdresser     Journalist, newspaper    Dominant Hand: Right    Extremity/Trunk Assessment   Upper Extremity Assessment Upper Extremity Assessment: LUE deficits/detail LUE Deficits / Details: Slight decrease grip strength, 4/5 elbow; pt reports elbow and shoulder strength/motion limited by chest soreness    Lower Extremity Assessment Lower Extremity Assessment: LLE deficits/detail;RLE deficits/detail RLE Deficits / Details: Hip flex 3-4/5 (pt reports limited by chest soreness); knee and ankle 5/5 LLE Deficits / Details: Hip flex 4/5 (pt reports limited by chest soreness); knee and ankle 5/5       Communication   Communication: No difficulties  Cognition Arousal/Alertness: Awake/alert Behavior During Therapy: WFL for tasks assessed/performed;Flat affect Overall Cognitive Status: Impaired/Different from baseline Area of Impairment: Problem solving;Following commands                       Following Commands: Follows multi-step commands inconsistently     Problem Solving: Slow processing        General Comments General comments (skin integrity, edema, etc.): Pt initially on 2L with SpO2 100%; [place] removed and pt maintaining 93-95% on RA. Post-mobility BP 154/117, HR 71    Exercises     Assessment/Plan    PT Assessment Patient needs continued PT services  PT Problem List Decreased strength;Decreased activity tolerance;Decreased balance;Decreased mobility;Decreased safety awareness;Decreased cognition;Cardiopulmonary status limiting activity       PT Treatment Interventions Gait training;Stair training;Functional mobility training;Therapeutic activities;Therapeutic exercise;Balance training;Patient/family education;Cognitive remediation    PT Goals (Current goals can be found in the Care Plan section)  Acute Rehab PT Goals Patient Stated Goal: Go home; hopes to get back to decorating and working on flower arrangements PT Goal Formulation: With patient Time For Goal Achievement: 08/19/19 Potential to  Achieve Goals: Good    Frequency Min 4X/week   Barriers to discharge        Co-evaluation               AM-PAC PT "6 Clicks" Mobility  Outcome Measure Help needed turning from your back to your side while in a flat bed without using bedrails?: None Help needed moving from lying on your back to sitting on the side of a flat bed without using bedrails?: A Little Help needed moving to and from a bed to a chair (including a wheelchair)?: A Little Help needed standing up from a chair using your arms (e.g., wheelchair or bedside chair)?: A Little Help needed to walk in hospital room?: A Little Help needed climbing 3-5 steps with a railing? : A Little 6 Click Score: 19    End of Session   Activity Tolerance: Patient tolerated treatment well Patient left: in chair;with call bell/phone within reach(RN said no chair alarm ok) Nurse Communication: Mobility status PT Visit Diagnosis: Unsteadiness on feet (R26.81);Other abnormalities of gait and mobility (R26.89)    Time: 7654-6503 PT Time Calculation (min) (ACUTE ONLY): 21 min   Charges:   PT Evaluation $PT Re-evaluation: 1 Re-eval     Mabeline Caras, PT, DPT Acute Rehabilitation Services  Pager [phone] Office [place] 08/05/2019, 11:04 AM

## 2019-08-05 NOTE — Progress Notes (Signed)
Modified Barium Swallow Progress Note  Patient Details  Name: [name] MRN: [id number] Date of Birth: 02-Apr-1951  Today's Date: 08/05/2019  Modified Barium Swallow completed.  Full report located under Chart Review in the Imaging Section.  Brief recommendations include the following:  Clinical Impression  Pt exhibits moderate oropharyngeal dysphagia marked by discoordination and mistiming of swallowing sequence and suspected esophageal disturbance. Orally, barium fell under tongue as she manipulated and propelled but able to clear oral cavity. Timing of epiglottic deflection was late with barium passing epiglottis then pushed into laryngeal vestibule and aspirated with thin and nectar consistencies and penetration with honey thick. She sensed only one of two aspiration episodes. Tucking of chin reduced airway compromise to flash with nectar however pt taking large sips and ability to consistently is questionable. Minimal vallecular residue present. Pt reported symptoms of reflux/esophageal difficulties for "a long time". Esophageal scan revealed barium retention at mid esophagus. Recommend continue Dys 3, downgrade liquids to honey, pills whole in puree, stay upright after meals.       Swallow Evaluation Recommendations       SLP Diet Recommendations: Dysphagia 3 (Mech soft) solids;Honey thick liquids   Liquid Administration via: Cup   Medication Administration: Whole meds with puree   Supervision: Patient able to self feed;Intermittent supervision to cue for compensatory strategies   Compensations: Slow rate;Small sips/bites;Clear throat intermittently   Postural Changes: Remain semi-upright after after feeds/meals (Comment);Seated upright at 90 degrees   Oral Care Recommendations: Oral care BID   Other Recommendations: Order thickener from pharmacy    [place] Siren 08/05/2019,1:37 PM   Orbie Pyo Colvin Caroli.Ed Risk analyst  [phone] Office [phone]

## 2019-08-05 NOTE — Progress Notes (Signed)
STROKE TEAM PROGRESS NOTE   INTERVAL HISTORY Patient was found to be in A. fib as per cardiology) to start Eliquis.  She is doing well and has no complaints today.  Loop recorder hence cancelled.  Repeat CT head yesterday showed no acute changes Vitals:   08/05/19 0900 08/05/19 1000 08/05/19 1100 08/05/19 1200  BP: 131/66  (!) 121/108 121/65  Pulse: (!) 56 68 67 71  Resp: 17 18 (!) 23 13  Temp:   98.5 F (36.9 C)   TempSrc:   Oral   SpO2: 93% 91% 94% 91%  Weight:      Height:        CBC:  Recent Labs  Lab 08/02/19 2030 08/02/19 2225 08/03/19 1000 08/04/19 0209 08/04/19 0652 08/05/19 0303  WBC 7.0   < > 6.6   < > 23.2* 9.7  NEUTROABS 4.2  --  4.3  --   --   --   HGB 14.1   < > 14.3   < > 14.6 13.2  HCT 42.6   < > 43.3   < > 45.4 39.7  MCV 88.9   < > 91.2   < > 92.7 89.8  PLT 242   < > 251   < > 254 200   < > = values in this interval not displayed.    Basic Metabolic Panel:  Recent Labs  Lab 08/04/19 0652 08/05/19 0303  NA 143 143  K 3.2* 4.1  CL 110 110  CO2 17* 20*  GLUCOSE 226* 111*  BUN 11 17  CREATININE 1.34* 1.21*  CALCIUM 8.6* 8.8*  MG 3.5* 2.4  PHOS 5.0*  --    Lipid Panel:     Component Value Date/Time   CHOL 232 (H) 08/03/2019 0249   TRIG 128 08/03/2019 0249   HDL 31 (L) 08/03/2019 0249   CHOLHDL 7.5 08/03/2019 0249   VLDL 26 08/03/2019 0249   LDLCALC 175 (H) 08/03/2019 0249   HgbA1c:  Lab Results  Component Value Date   HGBA1C 5.7 (H) 08/03/2019   Urine Drug Screen:     Component Value Date/Time   LABOPIA NONE DETECTED 08/02/2019 2100   COCAINSCRNUR NONE DETECTED 08/02/2019 2100   LABBENZ NONE DETECTED 08/02/2019 2100   AMPHETMU NONE DETECTED 08/02/2019 2100   THCU NONE DETECTED 08/02/2019 2100   LABBARB NONE DETECTED 08/02/2019 2100    Alcohol Level     Component Value Date/Time   ETH <10 08/02/2019 2030    IMAGING past 48 hours DG Abd 1 View  Result Date: 08/04/2019 CLINICAL DATA:  OG tube placement EXAM: ABDOMEN - 1  VIEW COMPARISON:  None. FINDINGS: Enteric tube tip overlies the distal stomach. Endotracheal tube is now 3 cm above the carina. Unremarkable bowel gas pattern. IMPRESSION: Enteric tube tip overlies distal stomach. Electronically Signed   By: Macy Mis M.D.   On: 08/04/2019 08:49   CT HEAD WO CONTRAST  Result Date: 08/04/2019 CLINICAL DATA:  Neuro deficit with stroke suspected EXAM: CT HEAD WITHOUT CONTRAST TECHNIQUE: Contiguous axial images were obtained from the base of the skull through the vertex without intravenous contrast. 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 No evidence of infarct progression or hemorrhagic transformation. Suboccipital craniectomy. No hydrocephalus or masslike finding. History of Chiari malformation. Vascular: No hyperdense vessel. Skull: Suboccipital craniectomy Sinuses/Orbits: Negative IMPRESSION: 1. No acute finding when compared to MRI 2 days ago. 2. Recent right basal  ganglia perforator infarct without hemorrhage or visible progression. Electronically Signed   By: Monte Fantasia M.D.   On: 08/04/2019 11:04   CARDIAC CATHETERIZATION  Result Date: 08/04/2019 Left Heart Catheterization 08/04/19: LV: Global hypokinesis, upper limit of normal size, EF 25 to 30%. Left main: Normal. LAD: Mild diffuse disease.  Proximal LAD has a 30 to 40% stenosis, mid segment has a 20 to 30% stenosis, scattered disease noted in the LAD.  Brisk flow. Circumflex: Again scattered disease noted in the circumflex.  Mid segment has at most a 30 to 40% stenosis which appears to be eccentric and calcified. RCA: Dominant.  Mild diffuse disease again noted.  Mid segment after the origin of RV branch has a 70 to 80% stenosis.  Brisk flow is evident throughout the RCA. Impression: Findings consistent with nonischemic cardiomyopathy.  Although she has significant disease in the right coronary artery, this does not explain her  presentation with global hypokinesis, neither does it explain VF arrest as the lesion does not appear to be unstable. 35 mL contrast used.   DG CHEST PORT 1 VIEW  Result Date: 08/04/2019 CLINICAL DATA:  Respiratory arrest EXAM: PORTABLE CHEST 1 VIEW COMPARISON:  02/10/2017 FINDINGS: Endotracheal tube with tip 10 mm above the carina. Symmetric low volume lungs with mild interstitial coarsening. There is no edema, consolidation, effusion, or pneumothorax. Normal heart size and mediastinal contours. IMPRESSION: 1. Endotracheal tube with tip 10 mm above the carina. 2. Symmetric low volume lungs. Electronically Signed   By: Monte Fantasia M.D.   On: 08/04/2019 07:11   DG Swallowing Func-Speech Pathology  Result Date: 08/05/2019 Objective Swallowing Evaluation: Type of Study: MBS-Modified Barium Swallow Study  Patient Details Name: [name] MRN: [id number] Date of Birth: 04-26-1951 Today's Date: 08/05/2019 Time: SLP Start Time (ACUTE ONLY): 1751 -SLP Stop Time (ACUTE ONLY): 1100 SLP Time Calculation (min) (ACUTE ONLY): 18 min Past Medical History: Past Medical History: Diagnosis Date . Allergy  . Anxiety  . Arnold-Chiari malformation ([place])  . Arthritis  . GERD (gastroesophageal reflux disease)  . H. pylori infection   3 years ago . Hyperlipidemia  . Incontinence  . Syncope and collapse   Per pt, denies passing out . Tobacco use disorder  . Unspecified essential hypertension  Past Surgical History: Past Surgical History: Procedure Laterality Date . APPENDECTOMY   . ARNOLD CHIARI SURGERY    neurocranial surgery . BLEPHAROPLASTY    Bil . C-EYE SURGERY PROCEDURE   . CHOLECYSTECTOMY   . EYE SURGERY   . LEFT HEART CATH AND CORONARY ANGIOGRAPHY N/A 08/04/2019  Procedure: LEFT HEART CATH AND CORONARY ANGIOGRAPHY;  Surgeon: Adrian Prows, MD;  Location: [facility];  Service: Cardiovascular;  Laterality: N/A; . MASS EXCISION Right 12/27/2013  Procedure: MINOR EXCISION OF RIGHT THUMB MUCOID CYST, DEBRIDEMENT OF  INTERPHALANGEAL JOINT;  Surgeon: Cammie Sickle, MD;  Location: [facility];  Service: Orthopedics;  Laterality: Right; . mass on thumb  right . TOTAL KNEE ARTHROPLASTY Right 02/17/2017 . TOTAL KNEE ARTHROPLASTY Right 02/17/2017  Procedure: TOTAL KNEE ARTHROPLASTY;  Surgeon: Melrose Nakayama, MD;  Location: [facility];  Service: Orthopedics;  Laterality: Right; . TUBAL LIGATION   HPI: DELANA MANGANELLO is a 69 y.o. female with medical history significant for anxiety, hypertension, and Chiari I malformation, presented to the emergency department with difficulty swallowing, dizziness, and left facial pressure on 08/02/19. 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 ST recommended MBS to be completed 2/28  however morning of 2/18 pt had Vfib, coded and intubated for several hours.   Subjective: "I'm not trying to be here too long" Assessment / Plan / Recommendation CHL IP CLINICAL IMPRESSIONS 08/05/2019 Clinical Impression Pt exhibits moderate oropharyngeal dysphagia marked by discoordination and mistiming of swallowing sequence and suspected esophageal disturbance. Orally, barium fell under tongue as she manipulated and propelled but able to clear oral cavity. Timing of epiglottic deflection was late with barium passing epiglottis then pushed into laryngeal vestibule and aspirated with thin and nectar consistencies and penetration with honey thick. She sensed only one of two aspiration episodes. Tucking of chin reduced airway compromise to flash with nectar however pt taking large sips and ability to consistently is questionable. Minimal vallecular residue present. Pt reported symptoms of reflux/esophageal difficulties for "a long time". Esophageal scan revealed barium retention at mid esophagus. Recommend continue Dys 3, downgrade liquids to honey, pills whole in puree, stay upright after meals.     SLP Visit Diagnosis Dysphagia, oropharyngeal phase (R13.12) Attention and concentration  deficit following -- Frontal lobe and executive function deficit following -- Impact on safety and function Moderate aspiration risk   CHL IP TREATMENT RECOMMENDATION 08/05/2019 Treatment Recommendations Therapy as outlined in treatment plan below   Prognosis 08/05/2019 Prognosis for Safe Diet Advancement Good Barriers to Reach Goals -- Barriers/Prognosis Comment -- CHL IP DIET RECOMMENDATION 08/05/2019 SLP Diet Recommendations Dysphagia 3 (Mech soft) solids;Honey thick liquids Liquid Administration via Cup Medication Administration Whole meds with puree Compensations Slow rate;Small sips/bites;Clear throat intermittently Postural Changes Remain semi-upright after after feeds/meals (Comment);Seated upright at 90 degrees   CHL IP OTHER RECOMMENDATIONS 08/05/2019 Recommended Consults -- Oral Care Recommendations Oral care BID Other Recommendations Order thickener from pharmacy   CHL IP FOLLOW UP RECOMMENDATIONS 08/05/2019 Follow up Recommendations Other (comment)   CHL IP FREQUENCY AND DURATION 08/05/2019 Speech Therapy Frequency (ACUTE ONLY) min 2x/week Treatment Duration 2 weeks      CHL IP ORAL PHASE 08/05/2019 Oral Phase Impaired Oral - Pudding Teaspoon -- Oral - Pudding Cup -- Oral - Honey Teaspoon -- Oral - Honey Cup Decreased bolus cohesion Oral - Nectar Teaspoon -- Oral - Nectar Cup Decreased bolus cohesion Oral - Nectar Straw -- Oral - Thin Teaspoon -- Oral - Thin Cup Decreased bolus cohesion Oral - Thin Straw -- Oral - Puree -- Oral - Mech Soft -- Oral - Regular (No Data) Oral - Multi-Consistency -- Oral - Pill -- Oral Phase - Comment --  CHL IP PHARYNGEAL PHASE 08/05/2019 Pharyngeal Phase Impaired Pharyngeal- Pudding Teaspoon -- Pharyngeal -- Pharyngeal- Pudding Cup -- Pharyngeal -- Pharyngeal- Honey Teaspoon -- Pharyngeal -- Pharyngeal- Honey Cup Penetration/Aspiration during swallow;Pharyngeal residue - valleculae Pharyngeal Material enters airway, remains ABOVE vocal cords then ejected out Pharyngeal- Nectar  Teaspoon -- Pharyngeal -- Pharyngeal- Nectar Cup Penetration/Aspiration during swallow;Pharyngeal residue - valleculae Pharyngeal Material enters airway, passes BELOW cords without attempt by patient to eject out (silent aspiration) Pharyngeal- Nectar Straw -- Pharyngeal -- Pharyngeal- Thin Teaspoon -- Pharyngeal -- Pharyngeal- Thin Cup Penetration/Aspiration during swallow Pharyngeal Material enters airway, passes BELOW cords and not ejected out despite cough attempt by patient Pharyngeal- Thin Straw -- Pharyngeal -- Pharyngeal- Puree -- Pharyngeal -- Pharyngeal- Mechanical Soft -- Pharyngeal -- Pharyngeal- Regular WFL Pharyngeal -- Pharyngeal- Multi-consistency -- Pharyngeal -- Pharyngeal- Pill -- Pharyngeal -- Pharyngeal Comment --  CHL IP CERVICAL ESOPHAGEAL PHASE 08/05/2019 Cervical Esophageal Phase WFL Pudding Teaspoon -- Pudding Cup -- Honey Teaspoon -- Honey Cup -- Nectar Teaspoon -- Nectar Cup --  Nectar Straw -- Thin Teaspoon -- Thin Cup -- Thin Straw -- Puree -- Mechanical Soft -- Regular -- Multi-consistency -- Pill -- Cervical Esophageal Comment -- [place] Siren 08/05/2019, 1:37 PM Orbie Pyo Colvin Caroli.Ed Actor Pager [phone] Office [phone]              ECHOCARDIOGRAM LIMITED BUBBLE STUDY  Result Date: 08/04/2019    ECHOCARDIOGRAM LIMITED REPORT   Patient Name:   [name] Reamy Date of Exam: 08/04/2019 Medical Rec #:  [id number]      Height:       64.0 in Accession #:    [id number]     Weight:       159.0 lb Date of Birth:  08-18-1950      BSA:          1.77 m Patient Age:    69 years       BP:           96/63 mmHg Patient Gender: F              HR:           121 bpm. Exam Location:  Inpatient Procedure: Limited Echo, Color Doppler, Cardiac Doppler, Saline Contrast Bubble            Study and Intracardiac Opacification Agent STAT ECHO Indications:    Cardiac Arrest, Stroke  History:        Patient has prior history of Echocardiogram examinations, most                  recent 08/03/2019. Risk Factors:Hypertension and Dyslipidemia.  Sonographer:    Raquel Sarna Senior RDCS Referring Phys: [id number] Candee Furbish  Sonographer Comments: Limited to assess for cardiac source of thrombus or shunting. IMPRESSIONS  1. Left ventricular ejection fraction, by estimation, is 25 to 30%. The left ventricle has severely decreased function. The left ventricle demonstrates global hypokinesis. There was marked septal-lateral dyssynchrony consistent with LBBB. No LV thrombus  noted on Definity study.  2. Right ventricular systolic function is normal. The right ventricular size is normal. There is normal pulmonary artery systolic pressure.  3. The aortic valve is tricuspid. Aortic valve regurgitation is not visualized. No aortic stenosis is present.  4. The inferior vena cava is normal in size with <50% respiratory variability, suggesting right atrial pressure of 8 mmHg.  5. Bubble study appeared negative for shunt lesion but was a difficult study due to poor windows.  6. Limited echo. FINDINGS  Left Ventricle: Left ventricular ejection fraction, by estimation, is 25 to 30%. The left ventricle has severely decreased function. The left ventricle demonstrates global hypokinesis. Definity contrast agent was given IV to delineate the left ventricular endocardial borders. The left ventricular internal cavity size was normal in size. There is no left ventricular hypertrophy. Abnormal (paradoxical) septal motion, consistent with left bundle branch block. Right Ventricle: The right ventricular size is normal. No increase in right ventricular wall thickness. Right ventricular systolic function is normal. There is normal pulmonary artery systolic pressure. The tricuspid regurgitant velocity is 2.21 m/s, and  with an assumed right atrial pressure of 8 mmHg, the estimated right ventricular systolic pressure is 10.1 mmHg. Left Atrium: Left atrial size was normal in size. Right Atrium: Right atrial size was normal in  size. Tricuspid Valve: The tricuspid valve is normal in structure. Tricuspid valve regurgitation is trivial. Aortic Valve: The aortic valve is tricuspid. Aortic valve regurgitation is not visualized. No aortic stenosis is  present. Pulmonic Valve: The pulmonic valve was normal in structure. Pulmonic valve regurgitation is not visualized. Venous: The inferior vena cava is normal in size with less than 50% respiratory variability, suggesting right atrial pressure of 8 mmHg. IAS/Shunts: Agitated saline contrast was given intravenously to evaluate for intracardiac shunting.  RIGHT VENTRICLE RV S prime:     9.14 cm/s TAPSE (M-mode): 1.9 cm AORTIC VALVE LVOT Vmax:   75.00 cm/s LVOT Vmean:  53.300 cm/s LVOT VTI:    0.142 m TRICUSPID VALVE TR Peak grad:   19.5 mmHg TR Vmax:        221.00 cm/s  SHUNTS Systemic VTI: 0.14 m Loralie Champagne MD Electronically signed by Loralie Champagne MD Signature Date/Time: 08/04/2019/10:47:14 AM    Final     PHYSICAL EXAM Pleasant mildly obese elderly Caucasian lady not in distress. . Afebrile. Head is nontraumatic. Neck is supple without bruit.    Cardiac exam no murmur or gallop. Lungs are clear to auscultation. Distal pulses are well felt. Neurological Exam ;  Awake  Alert oriented x 3. Normal speech and language.eye movements full without nystagmus.fundi were not visualized. Vision acuity and fields appear normal. Hearing is normal. Palatal movements are normal. Face symmetric. Tongue midline. Normal strength, tone, reflexes and coordination.  Except mild diminished fine finger movements on the left.  Orbits right over left upper extremity.  Mild left hip flexor and ankle dorsiflexor weakness.  Subtle left leg drift on double simultaneous testing of lower extremities.  Normal sensation except over the left cheek where there is some hyperesthesia.. Gait deferred.  ASSESSMENT/PLAN Ms. ROGER FASNACHT is a 69 y.o. female with history of Chiari I malformation s/p decompression, HLD and HTN  presenting with dysphagia, L hemiparesis, dizziness and L facial pressure.   Stroke:   Large R basal ganglia infarct embolic secondary to paroxysmal atrial fibrillation V. fib cardiac arrest this morning with subtle increased left-sided weakness following the event.  CT head age indeterminate R lentiform nucleus / external capsule infarct. Small R frontal lobe subcortical white matter hypoattenuation, ? Chronic infarct. Old R caudate infarct.  Chiari I malformation. Mild atrophy.   CT Maxillofacial no mass, small L mastoid effusion  MRI  Acute R basal ganglia infarct   MRA  Normal  Repeat CT head 08/04/2019 no acute finding.  Recent right basal ganglia infarct without significant worsening.  Carotid Doppler bilateral mild plaques and 1-39% carotid stenosis 2D Echo pending   LDL 175  HgbA1c 5.7  Lovenox 40 mg sq daily for VTE prophylaxis  No antithrombotic prior to admission, now on aspirin 325 mg daily. Will change to aspirin 81, add plavix. Continue DAPT x 3 weeks then ASPIRIN alone.   Therapy recommendations:  No PT or OT. F/u SLP for swallow  Disposition:  Return home  Hypertension  Stable . Permissive hypertension (OK if < 220/120) but gradually normalize in 5-7 days . Long-term BP goal normotensive  Hyperlipidemia  Home meds:  No statin  LDL 175, goal < 70  Intolerant to statins (muscle pain)  Add zetia   Consider [facility] as an OP  Continue statin at discharge  Dysphagia . Secondary to stroke . Cleared for D3 nectar thick liquids . Speech on board   Other Stroke Risk Factors  Advanced age  Cigarette smoker, advised to stop smoking  Overweight, Body mass index is 27.28 kg/m., recommend weight loss, diet and exercise as appropriate   Other Active Problems  Hx Chiari I decompression  CKD stage III  Hypokalemia, repleted, resolved  LBBB  Hospital day # 2 Recommend Eliquis for anticoagulation for A. fib.  Mobilize out of bed.  Therapy  consults...  Long discussion with patient and her husband and answered questions.  Discussed with Dr. Tamala Julian and Einar Gip stroke team will sign off.  Kindly call for questions.    Antony Contras, MD To contact Stroke Continuity provider, please refer to [email or web address] After hours, contact General Neurology

## 2019-08-05 NOTE — Progress Notes (Addendum)
NAME:  [name], MRN:  [id number], DOB:  1950/09/06, LOS: 2 ADMISSION DATE:  08/02/2019, CONSULTATION DATE:  08/03/19 REFERRING MD:  ?, CHIEF COMPLAINT:  Arrest   Brief History   69 year old woman who presented initially with basilar ganglia stroke had cardiac arrest on floor and transferred emergently to ICU.  History of present illness   69 year old woman who presented initially with basilar ganglia stroke had cardiac arrest on floor and transferred emergently to ICU.  This AM noted to be in vfib without pulse, shocked and went into torsades, given Mg, went into PEA.  Total CPR time around 10-15 mins.  Now awake on vent, moving R side spontaneously, not moving L for me.  Per chart review, L is 4+/5.  Past Medical History  Tobacco use HLD GERD Arthritis [facility] Events   2/17 admitted  Consults:  PCCM  Procedures:  2/18 6 AM CPR x 10 minutes  Significant Diagnostic Tests:  CT Head 08/02/19 IMPRESSION: CT head: 1. Age-indeterminate lacunar infarct within the right lentiform nucleus/external capsule. 2. Small focus of ill-defined hypoattenuation within the right frontal lobe subcortical white matter which may reflect chronic ischemic change. A recent white matter infarct at this site cannot be excluded. 3. Chronic lacunar infarct within the right caudate. 4. 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 5. Mild generalized parenchymal atrophy.  Echo  IMPRESSIONS  1. Severely depressed LVEF with global hypokinesis. No apical thrombus  visualized on this study, however would consider repeat limited echo with  contrast to exclude LV thrombus. Could also consider TEE for further  evaluation of cardiac embolism.  2. Left ventricular ejection fraction, by estimation, is 25 to 30%. The  left ventricle has severely decreased function. The left ventricle  demonstrates global hypokinesis. There is moderate concentric left    ventricular hypertrophy. Left ventricular  diastolic parameters are indeterminate. Elevated left ventricular  end-diastolic pressure.  3. Right ventricular systolic function is normal. The right ventricular  size is normal.  4. The mitral valve is normal in structure and function. Mild mitral  valve regurgitation.  5. The aortic valve is tricuspid. Aortic valve regurgitation is not  visualized. No aortic stenosis is present.  6. The inferior vena cava is dilated in size with <50% respiratory  variability, suggesting right atrial pressure of 15 mmHg.   Micro Data:  COVID neg  Antimicrobials:  None   Interim history/subjective:  Extubation following V. fib arrest in the setting of CVA.  Now he stable Objective   Blood pressure (!) 139/56, pulse 76, temperature 98.2 F (36.8 C), temperature source Axillary, resp. rate 19, height 5\' 4"  (1.626 m), weight 72.1 kg, SpO2 94 %.        Intake/Output Summary (Last 24 hours) at 08/05/2019 0910 Last data filed at 08/05/2019 0800 Gross per 24 hour  Intake 750 ml  Output 510 ml  Net 240 ml   Filed Weights   08/03/19 0053 08/04/19 0636  Weight: 72.2 kg 72.1 kg    Examination: General: Well-nourished well-developed 69 year old female HEENT: Remarkable Neuro: Grossly intact CV: Heart sounds are regular regular rate and rhythm PULM: Decreased in the bases  GI: soft, bsx4 active  Extremities: warm/dry,  edema  Skin: no rashes or lesions    Resolved Hospital Problem list   N/A  Assessment & Plan:   Vfib Arrest- In setting of new cardiomyopathyawake now with purposeful movement,  Appreciate cardiology's input Holding off on TEE  at this time Further interventions per cardiology Transfer to cardiac stepdown unit Monitor electrolytes Further interventions per cardiology : Right sided CVA- suspected embolic in origin Continue to monitor Most likely embolic Further diagnostic evaluation as tolerated  3: New  cardiomyopathy Questionable cardiac cath in the future  4: Afib/RVR Controlled ventricular rate of 73  5: Respiratory failure in setting of cardiac arrest Status post extubation 08/03/2018 wean oxygen as tolerated  Pulmonary toilet Transfer to stepdown unit, [facility] to assume care 2/20 Arnold-Chiari malformation, HLD, CKD  Best practice:  Diet: Advance to heart healthy diet Pain/Anxiety/Delirium protocol (if indicated): precedex, PRN fentanyl VAP protocol (if indicated): in place 2/18 DVT prophylaxis: lovenox GI prophylaxis: PPI Glucose control: SSI Mobility: BR Code Status: Full Family Communication: Patient updated at the bedside 08/05/2019 Disposition: Transfer out of stepdown unit 08/05/2019  Labs   CBC: Recent Labs  Lab 08/02/19 2030 08/02/19 2030 08/02/19 2225 08/03/19 1000 08/04/19 0209 08/04/19 0652 08/05/19 0303  WBC 7.0  --   --  6.6 6.9 23.2* 9.7  NEUTROABS 4.2  --   --  4.3  --   --   --   HGB 14.1   < > 13.9 14.3 13.7 14.6 13.2  HCT 42.6   < > 41.0 43.3 41.9 45.4 39.7  MCV 88.9  --   --  91.2 90.5 92.7 89.8  PLT 242  --   --  251 224 254 200   < > = values in this interval not displayed.    Basic Metabolic Panel: Recent Labs  Lab 08/02/19 1328 08/02/19 1328 08/02/19 2225 08/03/19 0249 08/03/19 1000 08/04/19 0209 08/04/19 0652 08/05/19 0303  NA 141   < > 143  --  143 143 143 143  K 3.3*   < > 3.2*  --  3.9 3.9 3.2* 4.1  CL 107   < > 105  --  107 107 110 110  CO2 24  --   --   --  25 26 17* 20*  GLUCOSE 90   < > 104*  --  103* 95 226* 111*  BUN 9   < > 10  --  9 10 11 17   CREATININE 1.23*   < > 1.00  --  1.04* 1.10* 1.34* 1.21*  CALCIUM 9.1  --   --   --  8.8* 9.1 8.6* 8.8*  MG  --   --   --  1.9  --   --  3.5* 2.4  PHOS  --   --   --   --   --   --  5.0*  --    < > = values in this interval not displayed.   GFR: Estimated Creatinine Clearance: 42.7 mL/min (A) (by C-G formula based on SCr of 1.21 mg/dL (H)). Recent Labs  Lab 08/03/19 1000  08/04/19 0209 08/04/19 0652 08/04/19 1023 08/05/19 0303  WBC 6.6 6.9 23.2*  --  9.7  LATICACIDVEN  --   --  7.3* 2.7*  --     Liver Function Tests: Recent Labs  Lab 08/04/19 0652  AST 183*  ALT 101*  ALKPHOS 77  BILITOT 0.9  PROT 6.2*  ALBUMIN 3.5   No results for input(s): LIPASE, AMYLASE in the last 168 hours. No results for input(s): AMMONIA in the last 168 hours.  ABG    Component Value Date/Time   TCO2 27 08/02/2019 2225     Coagulation Profile: Recent Labs  Lab 08/02/19 2030 08/04/19 0652  INR  1.0 1.1    Cardiac Enzymes: No results for input(s): CKTOTAL, CKMB, CKMBINDEX, TROPONINI in the last 168 hours.  HbA1C: Hgb A1c MFr Bld  Date/Time Value Ref Range Status  08/03/2019 02:49 AM 5.7 (H) 4.8 - 5.6 % Final    Comment:    (NOTE) Pre diabetes:          5.7%-6.4% Diabetes:              >6.4% Glycemic control for   <7.0% adults with diabetes     CBG: Recent Labs  Lab 08/04/19 1618 08/04/19 1959 08/04/19 2339 08/05/19 0350 08/05/19 0804  GLUCAP [place] Victoria Henshaw ACNP Acute Care Nurse Practitioner [place] Please consult Amion 08/05/2019, 9:11 AM

## 2019-08-06 ENCOUNTER — Encounter (HOSPITAL_COMMUNITY): Payer: Self-pay | Admitting: Family Medicine

## 2019-08-06 ENCOUNTER — Inpatient Hospital Stay (HOSPITAL_COMMUNITY): Payer: Medicare HMO

## 2019-08-06 DIAGNOSIS — I480 Paroxysmal atrial fibrillation: Secondary | ICD-10-CM

## 2019-08-06 DIAGNOSIS — I4901 Ventricular fibrillation: Secondary | ICD-10-CM

## 2019-08-06 HISTORY — DX: Paroxysmal atrial fibrillation: I48.0

## 2019-08-06 LAB — CBC
HCT: 38.6 % (ref 36.0–46.0)
Hemoglobin: 12.7 g/dL (ref 12.0–15.0)
MCH: 29.7 pg (ref 26.0–34.0)
MCHC: 32.9 g/dL (ref 30.0–36.0)
MCV: 90.2 fL (ref 80.0–100.0)
Platelets: 188 10*3/uL (ref 150–400)
RBC: 4.28 MIL/uL (ref 3.87–5.11)
RDW: 13.3 % (ref 11.5–15.5)
WBC: 8.6 10*3/uL (ref 4.0–10.5)
nRBC: 0 % (ref 0.0–0.2)

## 2019-08-06 LAB — COMPREHENSIVE METABOLIC PANEL
ALT: 51 U/L — ABNORMAL HIGH (ref 0–44)
AST: 43 U/L — ABNORMAL HIGH (ref 15–41)
Albumin: 3.4 g/dL — ABNORMAL LOW (ref 3.5–5.0)
Alkaline Phosphatase: 66 U/L (ref 38–126)
Anion gap: 10 (ref 5–15)
BUN: 19 mg/dL (ref 8–23)
CO2: 23 mmol/L (ref 22–32)
Calcium: 8.8 mg/dL — ABNORMAL LOW (ref 8.9–10.3)
Chloride: 109 mmol/L (ref 98–111)
Creatinine, Ser: 1.18 mg/dL — ABNORMAL HIGH (ref 0.44–1.00)
GFR calc Af Amer: 54 mL/min — ABNORMAL LOW (ref >60)
GFR calc non Af Amer: 47 mL/min — ABNORMAL LOW (ref >60)
Glucose, Bld: 97 mg/dL (ref 70–99)
Potassium: 3.9 mmol/L (ref 3.5–5.1)
Sodium: 142 mmol/L (ref 135–145)
Total Bilirubin: 0.9 mg/dL (ref 0.3–1.2)
Total Protein: 5.8 g/dL — ABNORMAL LOW (ref 6.5–8.1)

## 2019-08-06 LAB — GLUCOSE, CAPILLARY
Glucose-Capillary: 103 mg/dL — ABNORMAL HIGH (ref 70–99)
Glucose-Capillary: 99 mg/dL (ref 70–99)
Glucose-Capillary: 108 mg/dL — ABNORMAL HIGH (ref 70–99)
Glucose-Capillary: 101 mg/dL — ABNORMAL HIGH (ref 70–99)

## 2019-08-06 LAB — PHOSPHORUS: Phosphorus: 2.9 mg/dL (ref 2.5–4.6)

## 2019-08-06 LAB — MAGNESIUM: Magnesium: 2.2 mg/dL (ref 1.7–2.4)

## 2019-08-06 MED ORDER — SACUBITRIL-VALSARTAN 24-26 MG PO TABS
1.0000 | ORAL_TABLET | Freq: Two times a day (BID) | ORAL | Status: DC
  Administered 2019-08-06 (×2): 1 via ORAL
  Filled 2019-08-06 (×3): qty 1

## 2019-08-06 MED ORDER — ROSUVASTATIN CALCIUM 5 MG PO TABS
10.0000 mg | ORAL_TABLET | Freq: Every day | ORAL | Status: DC
  Administered 2019-08-06: 10 mg via ORAL
  Filled 2019-08-06: qty 2

## 2019-08-06 MED ORDER — SPIRONOLACTONE 12.5 MG HALF TABLET
12.5000 mg | ORAL_TABLET | Freq: Every day | ORAL | Status: DC
  Administered 2019-08-06 – 2019-08-07 (×2): 12.5 mg via ORAL
  Filled 2019-08-06 (×2): qty 1

## 2019-08-06 NOTE — Progress Notes (Addendum)
PROGRESS NOTE  [name] QHU:[id number] DOB: Jun 24, 1950 DOA: 08/02/2019 PCP: Pleas Koch, NP  HPI/Recap of past 24 hours: HPI from Dr Jamse Arn 69 yo female with Arnold-Chiari malformation was admitted for basal ganglia stroke on  2/16, had vfib/torsades de pointes cardiac arrest on 2/18. Pt was successfully resuscitated in 10-15 minutes and transferred to ICU. Pt was extubated in ICU shortly thereafter and was observed for 24 hours. Patient was seen by Cardiology and was noted to have new LBBB. Echo reveals dilated cardiomyopathy with EF 25-30%. There is a suspicion that her stroke may have been embolic in nature. Cardiac cath revealed 70-80% RCA lesion that does not appear unstable and she was diagnosed with nonischemic cardiomyopathy. She was subsequently noted to have Paroxysmal Afib, ChadsVasc2 score 6. Pt was started on Eliquis and carvedilol. Neurology signed off. [facility] assumed care on 08/06/19.    Today, patient continues to complain of reproducible chest wall tenderness likely from CPR, denies any specific left-sided chest pain, shortness of breath, abdominal pain, nausea/vomiting, fever/chills.  Reported poor sleep last night due to significant noise on the floor.  Overall generalized weakness   Assessment/Plan: Principal Problem:   Acute ischemic stroke (HCC) Active Problems:   TOBACCO ABUSE   Essential hypertension   CKD (chronic kidney disease) stage 3, GFR 30-59 ml/min   Hypokalemia   Left bundle branch block   Statin intolerance   Dysphagia   Paroxysmal atrial fibrillation (HCC)   Acute ischemic infarct of the right basal ganglia MRI showed above Normal intra-cranial MRA Echo showed severely depressed LVEF with global hypokinesis, no apical thrombus, EF 25 to 30% Carotid Doppler showed bilateral mild plaques and 1 to 39% carotid stenosis LDL 175, A1c 5.7 Neurology consulted, continue aspirin, Eliquis, statins PT/OT/SLP Telemetry monitoring  V.  fib/torsades de pointes cardiac arrest Status post ACLS protocol, CPR, defibrillated and intubated Currently stable Monitor electrolytes closely Continue telemetry Cardiology on board Patient will be discharged home on LifeVest  New onset atypical LBBB/dilated cardiomyopathy Echo showed severely depressed LVEF with global hypokinesis, no apical thrombus, EF 25 to 30% Cardiology on board, start Entresto, Coreg, Aldactone Telemetry  Paroxysmal A. Fib Currently rate controlled CHA2DS2- VASc score 7 Continue coreg, eliquis  Hypertension Stable Continue meds as above  Hyperlipidemia/peripheral vascular disease LDL 175 Noted absent bilateral pedal pulses Likely due to heavy tobacco abuse, currently denies any claudication, reports cold feet Cardiology on board, may need vascular surgery likely as an outpatient Unable to tolerate Lipitor, Zocor, switched to Crestor  Dysphagia SLP on board Recommend dysphagia 3, honey thick  Tobacco abuse Discussed extensively about the need to quit Patient verbalized understanding        Malnutrition Type:      Malnutrition Characteristics:      Nutrition Interventions:       Estimated body mass index is 27.28 kg/m as calculated from the following:   Height as of this encounter: 5\' 4"  (1.626 m).   Weight as of this encounter: 72.1 kg.     Code Status: Full  Family Communication: None at bedside  Disposition Plan: Likely home in a day   Consultants:  Cardiology  Neurology  PCCM  Procedures:  Mechanical ventilation  Antimicrobials:  None  DVT prophylaxis: Eliquis   Objective: Vitals:   08/06/19 0422 08/06/19 0725 08/06/19 0807 08/06/19 1011  BP: 136/69 (!) 142/72  (!) 145/72  Pulse: 64 (!) 53 65 64  Resp: 17 18    Temp: 97.8 F (36.6 C)  98.2 F (36.8 C)    TempSrc: Oral Oral    SpO2: 91% 90%    Weight:      Height:        Intake/Output Summary (Last 24 hours) at 08/06/2019 1100 Last data  filed at 08/05/2019 2225 Gross per 24 hour  Intake 3 ml  Output 200 ml  Net -197 ml   Filed Weights   08/03/19 0053 08/04/19 0636  Weight: 72.2 kg 72.1 kg    Exam:  General: NAD, reproducible chest tenderness  Cardiovascular: S1, S2 present  Respiratory: CTAB  Abdomen: Soft, nontender, nondistended, bowel sounds present  Musculoskeletal: No bilateral pedal edema noted, absent pedal pulses  Skin: Normal  Psychiatry: Normal mood  Neurology: No obvious focal neurologic deficits, except mild left-sided facial droop, strength equal in all extremities   Data Reviewed: CBC: Recent Labs  Lab 08/02/19 2030 08/02/19 2225 08/03/19 1000 08/04/19 0209 08/04/19 0652 08/05/19 0303 08/06/19 0326  WBC 7.0   < > 6.6 6.9 23.2* 9.7 8.6  NEUTROABS 4.2  --  4.3  --   --   --   --   HGB 14.1   < > 14.3 13.7 14.6 13.2 12.7  HCT 42.6   < > 43.3 41.9 45.4 39.7 38.6  MCV 88.9   < > 91.2 90.5 92.7 89.8 90.2  PLT 242   < > 251 224 254 200 188   < > = values in this interval not displayed.   Basic Metabolic Panel: Recent Labs  Lab 08/03/19 0249 08/03/19 1000 08/04/19 0209 08/04/19 0652 08/05/19 0303 08/05/19 1447 08/06/19 0326  NA  --    < > 143 143 143 141 142  K  --    < > 3.9 3.2* 4.1 3.8 3.9  CL  --    < > 107 110 110 110 109  CO2  --    < > 26 17* 20* 22 23  GLUCOSE  --    < > 95 226* 111* 104* 97  BUN  --    < > 10 11 17 17 19   CREATININE  --    < > 1.10* 1.34* 1.21* 1.15* 1.18*  CALCIUM  --    < > 9.1 8.6* 8.8* 8.9 8.8*  MG 1.9  --   --  3.5* 2.4  --  2.2  PHOS  --   --   --  5.0*  --   --  2.9   < > = values in this interval not displayed.   GFR: Estimated Creatinine Clearance: 43.8 mL/min (A) (by C-G formula based on SCr of 1.18 mg/dL (H)). Liver Function Tests: Recent Labs  Lab 08/04/19 0652 08/05/19 1447 08/06/19 0326  AST 183* 58* 43*  ALT 101* 62* 51*  ALKPHOS 77 71 66  BILITOT 0.9 1.2 0.9  PROT 6.2* 6.3* 5.8*  ALBUMIN 3.5 3.4* 3.4*   No results for  input(s): LIPASE, AMYLASE in the last 168 hours. No results for input(s): AMMONIA in the last 168 hours. Coagulation Profile: Recent Labs  Lab 08/02/19 2030 08/04/19 0652  INR 1.0 1.1   Cardiac Enzymes: No results for input(s): CKTOTAL, CKMB, CKMBINDEX, TROPONINI in the last 168 hours. BNP (last 3 results) No results for input(s): PROBNP in the last 8760 hours. HbA1C: No results for input(s): HGBA1C in the last 72 hours. CBG: Recent Labs  Lab 08/05/19 1139 08/05/19 1544 08/05/19 1840 08/05/19 2113 08/06/19 0635  GLUCAP 131* 136* 139* 128* 103*   Lipid  Profile: No results for input(s): CHOL, HDL, LDLCALC, TRIG, CHOLHDL, LDLDIRECT in the last 72 hours. Thyroid Function Tests: No results for input(s): TSH, T4TOTAL, FREET4, T3FREE, THYROIDAB in the last 72 hours. Anemia Panel: No results for input(s): VITAMINB12, FOLATE, FERRITIN, TIBC, IRON, RETICCTPCT in the last 72 hours. Urine analysis:    Component Value Date/Time   COLORURINE STRAW (A) 08/02/2019 2100   APPEARANCEUR CLEAR 08/02/2019 2100   LABSPEC 1.003 (L) 08/02/2019 2100   PHURINE 7.0 08/02/2019 2100   GLUCOSEU NEGATIVE 08/02/2019 2100   HGBUR NEGATIVE 08/02/2019 2100   HGBUR negative 03/01/2007 [place] NEGATIVE 08/02/2019 2100   BILIRUBINUR NEG 01/21/2017 1147   KETONESUR NEGATIVE 08/02/2019 2100   PROTEINUR NEGATIVE 08/02/2019 2100   UROBILINOGEN 0.2 01/21/2017 1147   UROBILINOGEN 0.2 01/12/2011 1203   NITRITE NEGATIVE 08/02/2019 2100   LEUKOCYTESUR NEGATIVE 08/02/2019 2100   Sepsis Labs: @LABRCNTIP (procalcitonin:4,lacticidven:4)  ) Recent Results (from the past 240 hour(s))  SARS CORONAVIRUS 2 (TAT 6-24 HRS) Nasopharyngeal Nasopharyngeal Swab     Status: None   Collection Time: 08/02/19  7:31 PM   Specimen: Nasopharyngeal Swab  Result Value Ref Range Status   SARS Coronavirus 2 NEGATIVE NEGATIVE Final    Comment: (NOTE) SARS-CoV-2 target nucleic acids are NOT DETECTED. The SARS-CoV-2 RNA  is generally detectable in upper and lower respiratory specimens during the acute phase of infection. Negative results do not preclude SARS-CoV-2 infection, do not rule out co-infections with other pathogens, and should not be used as the sole basis for treatment or other patient management decisions. Negative results must be combined with clinical observations, patient history, and epidemiological information. The expected result is Negative. Fact Sheet for Patients: SugarRoll.be Fact Sheet for Healthcare Providers: https://www.woods-mathews.com/ This test is not yet approved or cleared by the [place] FDA and  has been authorized for detection and/or diagnosis of SARS-CoV-2 by FDA under an Emergency Use Authorization (EUA). This EUA will remain  in effect (meaning this test can be used) for the duration of the COVID-19 declaration under Section 56 4(b)(1) of the Act, 21 U.S.C. section 360bbb-3(b)(1), unless the authorization is terminated or revoked sooner. Performed at [facility], [place] [address], [place], [place] [address]   Culture, blood (routine x 2)     Status: None (Preliminary result)   Collection Time: 08/04/19  6:50 AM   Specimen: BLOOD RIGHT HAND  Result Value Ref Range Status   Specimen Description BLOOD RIGHT HAND  Final   Special Requests   Final    BOTTLES DRAWN AEROBIC AND ANAEROBIC Blood Culture results may not be optimal due to an inadequate volume of blood received in culture bottles Performed at [facility], [place] [address], [place], [place] [address]    Culture NO GROWTH 2 DAYS  Final   Report Status PENDING  Incomplete  Culture, blood (routine x 2)     Status: None (Preliminary result)   Collection Time: 08/04/19  6:52 AM   Specimen: BLOOD  Result Value Ref Range Status   Specimen Description BLOOD LEFT ANTECUBITAL  Final   Special Requests   Final    BOTTLES DRAWN AEROBIC AND ANAEROBIC Blood  Culture adequate volume Performed at [facility], [place] [address] [place], [place] [address]    Culture NO GROWTH 2 DAYS  Final   Report Status PENDING  Incomplete      Studies: DG Chest Port 1 View  Result Date: 08/06/2019 CLINICAL DATA:  Status update  EXAM: PORTABLE CHEST 1 VIEW COMPARISON:  Two days ago FINDINGS: Extubation with stable inflation. Long-standing calcified density over the right scapula, seen since 2010 at least. Generous heart size. There is no edema, consolidation, effusion, or pneumothorax. IMPRESSION: Clear lungs after extubation. Electronically Signed   By: Monte Fantasia M.D.   On: 08/06/2019 07:27    Scheduled Meds: . apixaban  5 mg Oral BID  . aspirin  81 mg Per Tube Daily  . carvedilol  6.25 mg Oral BID WC  . Chlorhexidine Gluconate Cloth  6 each Topical Daily  . ezetimibe  10 mg Per Tube Daily  . insulin aspart  0-15 Units Subcutaneous TID WC  . mouth rinse  15 mL Mouth Rinse q12n4p  . pantoprazole  40 mg Oral Q1200  . rosuvastatin  10 mg Oral q1800  . sacubitril-valsartan  1 tablet Oral BID  . sodium chloride flush  3 mL Intravenous Q12H  . spironolactone  12.5 mg Oral Daily    Continuous Infusions: . sodium chloride       LOS: 3 days     Alma Friendly, MD [facility]  If 7PM-7AM, please contact night-coverage [email or web address] 08/06/2019, 11:00 AM

## 2019-08-06 NOTE — Evaluation (Signed)
Occupational Therapy Evaluation Patient Details Name: [name] MRN: [id number] DOB: 08/27/1950 Today's Date: 08/06/2019    History of Present Illness Pt is a 69 y.o. female admitted 08/02/19 with dificulty swallowing, dizziness and L facial pressure. MRI notable for acute ischemic infarct in R basal ganglia. On 2/18, pt noted to be in vfib without pulse, shocked and went into torsades and PEA; transfer to ICU; brief intubation 2/18.  Head CT 2/18 without new acute finding. PMH includes anxiety, HTN, Chiari I malformation.   Clinical Impression   Pt admitted with above. She demonstrates the below listed deficits and will benefit from continued OT to maximize safety and independence with BADLs.  Pt requires min A for ADLs.  She did loose her balance x 2 while standing to bathe and therefore recommend she use shower seat at home to prevent falls.  See comments under cognition for details of deficits displayed during ADLs.  As pt fatigued she demonstrated Lt inattention as she repeatedly ran into items on the Lt and required mod cues to correct.  Recommend 24 hour supervision/assist at discharge, no driving, and HHOT.       Follow Up Recommendations  Home health OT;Supervision/Assistance - 24 hour    Equipment Recommendations  Tub/shower seat    Recommendations for Other Services       Precautions / Restrictions Precautions Precautions: Fall      Mobility Bed Mobility Overal bed mobility: Needs Assistance Bed Mobility: Supine to Sit Rolling: Supervision            Transfers Overall transfer level: Needs assistance Equipment used: Rolling walker (2 wheeled) Transfers: Sit to/from [employer] Transfers Sit to Stand: Min guard;Min assist Stand pivot transfers: Min guard;Min assist       General transfer comment: initially min guard assist, progressing to min A and mod cues as she fatigued     Balance Overall balance assessment: Needs assistance Sitting-balance  support: Feet supported;No upper extremity supported Sitting balance-Leahy Scale: Fair     Standing balance support: During functional activity Standing balance-Leahy Scale: Fair Standing balance comment: able to maintain static standing with min guard assist                            ADL either performed or assessed with clinical judgement   ADL Overall ADL's : Needs assistance/impaired     Grooming: Brushing hair;Set up;Sitting   Upper Body Bathing: Minimal assistance;Standing Upper Body Bathing Details (indicate cue type and reason): lost balance requiring min A.  See comments under cognition  Lower Body Bathing: Minimal assistance;Sit to/from stand Lower Body Bathing Details (indicate cue type and reason): lost balance requiring min A to correct.  See comments under cognition  Upper Body Dressing : Set up;Sitting Upper Body Dressing Details (indicate cue type and reason): hospital gown  Lower Body Dressing: Moderate assistance;Sit to/from stand Lower Body Dressing Details (indicate cue type and reason): pt demonstrated difficulty threading Lt leg through pant leg, and with pulling underpants over Lt hip  Toilet Transfer: Minimal assistance;Ambulation;Comfort height toilet;Grab bars;RW Armed forces technical officer Details (indicate cue type and reason): requires verbal cues for safe technique and assist to move into standing as well as assist to maneuver RW  Toileting- Clothing Manipulation and Hygiene: Moderate assistance;Sit to/from stand Toileting - Clothing Manipulation Details (indicate cue type and reason): assist to pull underpants over Lt hip e     Functional mobility during ADLs: Minimal assistance;Rolling walker  Vision         Perception     Praxis      Pertinent Vitals/Pain Pain Assessment: Faces Faces Pain Scale: Hurts a little bit Pain Location: Chest (likely s/p CPR) Pain Descriptors / Indicators: Discomfort;Guarding;Grimacing;Sore Pain  Intervention(s): Monitored during session     Hand Dominance     Extremity/Trunk Assessment Upper Extremity Assessment Upper Extremity Assessment: LUE deficits/detail LUE Deficits / Details: grip strength grossly 4-/5    Lower Extremity Assessment Lower Extremity Assessment: Defer to PT evaluation       Communication     Cognition Arousal/Alertness: Awake/alert Behavior During Therapy: Flat affect Overall Cognitive Status: Impaired/Different from baseline Area of Impairment: Attention;Safety/judgement;Awareness;Problem solving;Following commands                   Current Attention Level: Sustained;Selective   Following Commands: Follows multi-step commands inconsistently Safety/Judgement: Decreased awareness of safety;Decreased awareness of deficits Awareness: Intellectual Problem Solving: Slow processing;Decreased initiation;Difficulty sequencing;Requires verbal cues;Requires tactile cues General Comments: on arrival, pt lying in large amount of urine.  She required mod verbal cues for correct walker use (as she tends to pull on walker).  She demonstrated difficulty with carry over of recommendations.  while standing at sink to bathe, pt demonstrated deficits with sequencing, organization, and problem solving.  Pt did not fully wring out washcloths, and subsequently dripped large amounts of water on vanity and floor with no awareness.   She was impulsive with movement.   She lost balance several times with little awareness, and no change in behavior to improve safety.  As she fatigued, she required mod cues and assist to maneuver RW through room and was noted to run repeatedly into items on her left, and required mod cues to correct errors.    General Comments       Exercises     Shoulder Instructions      Home Living                                          Prior Functioning/Environment                   OT Problem List:        OT  Treatment/Interventions:      OT Goals(Current goals can be found in the care plan section) Acute Rehab OT Goals Time For Goal Achievement: 08/17/19 Potential to Achieve Goals: Good ADL Goals Pt Will Perform Lower Body Bathing: with modified independence Pt Will Perform Lower Body Dressing: with modified independence Pt Will Transfer to Toilet: with modified independence Pt Will Perform Toileting - Clothing Manipulation and hygiene: with modified independence Pt Will Perform Tub/Shower Transfer: with supervision  OT Frequency: Min 2X/week   Barriers to D/C:            Co-evaluation              AM-PAC OT "6 Clicks" Daily Activity     Outcome Measure Help from another person eating meals?: None Help from another person taking care of personal grooming?: A Little Help from another person toileting, which includes using toliet, bedpan, or urinal?: A Little Help from another person bathing (including washing, rinsing, drying)?: A Little Help from another person to put on and taking off regular upper body clothing?: A Little Help from another person to put on and taking off regular lower body clothing?:  A Little 6 Click Score: 19   End of Session Equipment Utilized During Treatment: Surveyor, mining Communication: Mobility status  Activity Tolerance: Patient tolerated treatment well Patient left: in chair;with call bell/phone within reach;with chair alarm set;with nursing/sitter in room  OT Visit Diagnosis: Other abnormalities of gait and mobility (R26.89);Cognitive communication deficit (R41.841) Symptoms and signs involving cognitive functions: Cerebral infarction                Time: 1631-1700 OT Time Calculation (min): 29 min Charges:  OT General Charges $OT Visit: 1 Visit OT Treatments $Self Care/Home Management : 23-37 mins  Nilsa Nutting OTR/L Acute Rehabilitation Services Pager [phone] Office [phone]   Lucille Passy M 08/06/2019, 6:26 PM

## 2019-08-06 NOTE — Progress Notes (Signed)
Subjective:  Feels much better today, still has mild facial weakness. Emotional today.  Intake/Output from previous day:  I/O last 3 completed shifts: In: 253 [P.O.:120; I.V.:133] Out: 800 [Urine:800] No intake/output data recorded.  Blood pressure (!) 142/72, pulse 65, temperature 98.2 F (36.8 C), temperature source Oral, resp. rate 18, height '5\' 4"'$  (1.626 m), weight 72.1 kg, SpO2 90 %.  Vitals with BMI 08/06/2019 08/06/2019 08/06/2019  Height - - -  Weight - - -  BMI - - -  Systolic - 478 295  Diastolic - 72 69  Pulse 65 53 64    Physical Exam  Cardiovascular: Normal rate, regular rhythm, normal heart sounds and intact distal pulses. Exam reveals no gallop.  No murmur heard. Pulses:      Carotid pulses are on the right side with bruit.      Femoral pulses are 1+ on the right side and 0 on the left side.      Popliteal pulses are 0 on the right side and 0 on the left side.       Dorsalis pedis pulses are 0 on the right side and 0 on the left side.       Posterior tibial pulses are 0 on the right side and 0 on the left side.  No leg edema, no JVD.  Pulmonary/Chest: Effort normal and breath sounds normal.  Abdominal: Soft. Bowel sounds are normal.  Skin: Skin is warm and dry.   Lab Results: BMP BNP (last 3 results) No results for input(s): BNP in the last 8760 hours.  ProBNP (last 3 results) No results for input(s): PROBNP in the last 8760 hours. BMP Latest Ref Rng & Units 08/05/2019 08/05/2019 08/04/2019  Glucose 70 - 99 mg/dL 104(H) 111(H) 226(H)  BUN 8 - 23 mg/dL '17 17 11  '$ Creatinine 0.44 - 1.00 mg/dL 1.15(H) 1.21(H) 1.34(H)  Sodium 135 - 145 mmol/L 141 143 143  Potassium 3.5 - 5.1 mmol/L 3.8 4.1 3.2(L)  Chloride 98 - 111 mmol/L 110 110 110  CO2 22 - 32 mmol/L 22 20(L) 17(L)  Calcium 8.9 - 10.3 mg/dL 8.9 8.8(L) 8.6(L)   Hepatic Function Latest Ref Rng & Units 08/05/2019 08/04/2019 12/09/2018  Total Protein 6.5 - 8.1 g/dL 6.3(L) 6.2(L) 6.6  Albumin 3.5 - 5.0 g/dL 3.4(L)  3.5 4.2  AST 15 - 41 U/L 58(H) 183(H) 15  ALT 0 - 44 U/L 62(H) 101(H) 9  Alk Phosphatase 38 - 126 U/L 71 77 89  Total Bilirubin 0.3 - 1.2 mg/dL 1.2 0.9 0.5   CBC Latest Ref Rng & Units 08/06/2019 08/05/2019 08/04/2019  WBC 4.0 - 10.5 K/uL 8.6 9.7 23.2(H)  Hemoglobin 12.0 - 15.0 g/dL 12.7 13.2 14.6  Hematocrit 36.0 - 46.0 % 38.6 39.7 45.4  Platelets 150 - 400 K/uL 188 200 254   Lipid Panel     Component Value Date/Time   CHOL 232 (H) 08/03/2019 0249   TRIG 128 08/03/2019 0249   HDL 31 (L) 08/03/2019 0249   CHOLHDL 7.5 08/03/2019 0249   VLDL 26 08/03/2019 0249   LDLCALC 175 (H) 08/03/2019 0249   LDLDIRECT 193.0 09/02/2016 0839   Cardiac Panel (last 3 results) No results for input(s): CKTOTAL, CKMB, TROPONINI, RELINDX in the last 72 hours.  HEMOGLOBIN A1C Lab Results  Component Value Date   HGBA1C 5.7 (H) 08/03/2019   MPG 116.89 08/03/2019   TSH No results for input(s): TSH in the last 8760 hours.  CT of the head without contrast 08/04/2019: MPRESSION:  1. No acute finding when compared to MRI 2 days ago. 2. Recent right basal ganglia perforator infarct without hemorrhage or visible progression. Electronically Signed   By: Monte Fantasia M.D.   On: 08/04/2019 11:04   CT of the head with contrast 08/02/2019: : 1. Age-indeterminate lacunar infarct within the right lentiform nucleus/external capsule. 2. Small focus of ill-defined hypoattenuation within the right frontal lobe subcortical white matter which may reflect chronic ischemic change. A recent white matter infarct at this site cannot be excluded. 3. Chronic lacunar infarct within the right caudate. 4. 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 5. Mild generalized parenchymal atrophy. CT maxillofacial: 1. Streak artifact from dental restoration limits evaluation of the oral cavity and oropharynx. Within this limitation, no swelling or discrete mass is appreciated within the oral cavity or pharynx. 2. Small left  mastoid effusion. Electronically Signed   By: Kellie Simmering DO   On: 08/02/2019 18:50   MRI of the brain 08/02/2019: POSTERIOR CIRCULATION: --Vertebral arteries: Normal V4 segments. --Posterior inferior cerebellar arteries (PICA): Patent origins from the vertebral arteries. --Anterior inferior cerebellar arteries (AICA): Patent origins from the basilar artery. --Basilar artery: Normal. --Superior cerebellar arteries: Normal. --Posterior cerebral arteries: Normal. Both originate from the basilar artery. Posterior communicating arteries (p-comm) are diminutive or absent. ANTERIOR CIRCULATION: --Intracranial internal carotid arteries: Normal. --Anterior cerebral arteries (ACA): Normal. Both A1 segments are present. Patent anterior communicating artery (a-comm). --Middle cerebral arteries (MCA): Normal. IMPRESSION: 1. 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 No hemorrhage or mass effect. 2. Normal intracranial MRA. Electronically Signed   By: Ulyses Jarred M.D.   On: 08/02/2019 22:02   Chest x-ray 08/04/2019:  Result Date: 08/04/2019 CLINICAL DATA:  Respiratory arrest EXAM: PORTABLE CHEST 1 VIEW COMPARISON:  02/10/2017 FINDINGS: Endotracheal tube with tip 10 mm above the carina. Symmetric low volume lungs with mild interstitial coarsening. There is no edema, consolidation, effusion, or pneumothorax. Normal heart size and mediastinal contours. IMPRESSION: 1. Endotracheal tube with tip 10 mm above the carina. 2. Symmetric low volume lungs. Electronically Signed   By: Monte Fantasia M.D.   On: 08/04/2019 07:11  Cardiac Studies:   Echocardiogram 08/04/2019: 1. Left ventricular ejection fraction, by estimation, is 25 to 30%. The  left ventricle has severely decreased function. The left ventricle  demonstrates global hypokinesis. There was marked septal-lateral  dyssynchrony consistent with LBBB. No LV thrombus  noted on Definity study.  2. Bubble study appeared negative for shunt lesion but was  a difficult  study due to poor windows.   Carotid artery duplex  08/03/2019: Right Carotid: Velocities in the right ICA are consistent with a 1-39%  stenosis.  Left Carotid: Velocities in the left ICA are consistent with a 1-39%  stenosis.  Vertebrals: Bilateral vertebral arteries demonstrate antegrade flow.  Subclavians: Normal flow hemodynamics were seen in bilateral subclavian  arteries.   Scheduled Meds: . apixaban  5 mg Oral BID  . aspirin  81 mg Per Tube Daily  . atorvastatin  10 mg Per Tube q1800  . carvedilol  6.25 mg Oral BID WC  . Chlorhexidine Gluconate Cloth  6 each Topical Daily  . ezetimibe  10 mg Per Tube Daily  . insulin aspart  0-15 Units Subcutaneous TID WC  . mouth rinse  15 mL Mouth Rinse q12n4p  . pantoprazole  40 mg Oral Q1200  . sodium chloride flush  3 mL Intravenous Q12H   Continuous Infusions: . sodium chloride     PRN  Meds:.sodium chloride, acetaminophen **OR** acetaminophen (TYLENOL) oral liquid 160 mg/5 mL **OR** acetaminophen, bisacodyl, fentaNYL (SUBLIMAZE) injection, ondansetron (ZOFRAN) IV, Resource ThickenUp Clear, sodium chloride flush   Assessment   [name]  is a 69 y.o. Caucasian female with history of hypertension, hyperlipidemia, tobacco use disorder, history of Arnold-Chiari malformation, presenting with left-sided facial numbness and tingling.  She was found to have chronic strokes from previous and also new right basal ganglia stroke.  She was admitted to the hospital for further evaluation and was also found to have left bundle branch block.  Echocardiogram revealed severe LV systolic dysfunction.  Patient had an episode of sustained VT/VF and patient became pulseless, underwent ACLS protocol and CPR and was defibrillated and intubated.  However she was also successfully extubated upon arrival to the intensive care unit.  Coronary angiogram yesterday did not reveal CAD.    1.  VF/TDP cardiac arrest, successful resuscitation to  brief AFIB and then to NSR. 2. Paroxysmal atrial fibrillation CHA2DS2-VASc Score is 7.  Yearly risk of stroke: >9%(A, F, HTN, Vasc Dz, CVA,).   -(CHF; HTN; vasc disease DM,  Female = 1; Age <65 =0; 65-74 = 1,  >75 =2; stroke = 2).   3.  New onset atypical LBBB, echocardiogram suggesting dilated cardiomyopathy with severe LV systolic dysfunction.  EKG was normal in 2018. No significant CAD by coronary angiogram yesterday.  4.  Multiple strokes, old and also recent stroke 2 days ago, management per neurology.  Discussed with Dr. Leonie Man who agrees that anticoagulation if patient needs PCI is not an issue. 5.  Hypertension controlled 6.  Hyperlipidemia 7.  Peripheral arterial disease, she has absent pedal pulses and also very faint right femoral pulse and absent left femoral pulse. 8.  Tobacco use disorder    PAF episodes  Rec:   BP well controlled and tolerating COreg.  CMP and if LFT and Renal function stable, will initiate Entresto for cardiomyopathy and add aldactone.   Will discontinue Atorva (intolerance to atorva and simva)   Will be discharged home on LifeVest when ready for discharge.  RN in charge of patient has Rep contact # if patient gets discharged today or tomorrow.  Eliquis and asa 81 mg daily for 3 months and then Eliquis alone.   Adrian Prows, M.D. 08/06/2019, 8:27 AM [facility]

## 2019-08-07 LAB — COMPREHENSIVE METABOLIC PANEL
ALT: 41 U/L (ref 0–44)
AST: 32 U/L (ref 15–41)
Albumin: 3.2 g/dL — ABNORMAL LOW (ref 3.5–5.0)
Alkaline Phosphatase: 67 U/L (ref 38–126)
Anion gap: 13 (ref 5–15)
BUN: 18 mg/dL (ref 8–23)
CO2: 23 mmol/L (ref 22–32)
Calcium: 8.3 mg/dL — ABNORMAL LOW (ref 8.9–10.3)
Chloride: 105 mmol/L (ref 98–111)
Creatinine, Ser: 1.06 mg/dL — ABNORMAL HIGH (ref 0.44–1.00)
GFR calc Af Amer: >60 mL/min (ref >60)
GFR calc non Af Amer: 54 mL/min — ABNORMAL LOW (ref >60)
Glucose, Bld: 123 mg/dL — ABNORMAL HIGH (ref 70–99)
Potassium: 3.2 mmol/L — ABNORMAL LOW (ref 3.5–5.1)
Sodium: 141 mmol/L (ref 135–145)
Total Bilirubin: 1.2 mg/dL (ref 0.3–1.2)
Total Protein: 5.9 g/dL — ABNORMAL LOW (ref 6.5–8.1)

## 2019-08-07 LAB — CBC
HCT: 39.3 % (ref 36.0–46.0)
Hemoglobin: 13 g/dL (ref 12.0–15.0)
MCH: 29.6 pg (ref 26.0–34.0)
MCHC: 33.1 g/dL (ref 30.0–36.0)
MCV: 89.5 fL (ref 80.0–100.0)
Platelets: 199 10*3/uL (ref 150–400)
RBC: 4.39 MIL/uL (ref 3.87–5.11)
RDW: 12.9 % (ref 11.5–15.5)
WBC: 8.5 10*3/uL (ref 4.0–10.5)
nRBC: 0 % (ref 0.0–0.2)

## 2019-08-07 LAB — GLUCOSE, CAPILLARY
Glucose-Capillary: 113 mg/dL — ABNORMAL HIGH (ref 70–99)
Glucose-Capillary: 133 mg/dL — ABNORMAL HIGH (ref 70–99)

## 2019-08-07 LAB — MAGNESIUM: Magnesium: 1.9 mg/dL (ref 1.7–2.4)

## 2019-08-07 MED ORDER — SPIRONOLACTONE 25 MG PO TABS: 12.5000 mg | ORAL_TABLET | Freq: Every day | ORAL | 0 refills | Status: DC

## 2019-08-07 MED ORDER — CARVEDILOL 6.25 MG PO TABS: 6.2500 mg | ORAL_TABLET | Freq: Two times a day (BID) | ORAL | 0 refills | Status: DC

## 2019-08-07 MED ORDER — SACUBITRIL-VALSARTAN 49-51 MG PO TABS
1.0000 | ORAL_TABLET | Freq: Two times a day (BID) | ORAL | Status: DC
  Administered 2019-08-07: 1 via ORAL
  Filled 2019-08-07 (×2): qty 1

## 2019-08-07 MED ORDER — POTASSIUM CHLORIDE ER 10 MEQ PO TBCR: 10.0000 meq | EXTENDED_RELEASE_TABLET | Freq: Every day | ORAL | 0 refills | Status: DC

## 2019-08-07 MED ORDER — ASPIRIN 81 MG PO CHEW: 81.0000 mg | CHEWABLE_TABLET | Freq: Every day | ORAL | 0 refills | Status: AC

## 2019-08-07 MED ORDER — APIXABAN 5 MG PO TABS: 5.0000 mg | ORAL_TABLET | Freq: Two times a day (BID) | ORAL | 0 refills | Status: DC

## 2019-08-07 MED ORDER — SACUBITRIL-VALSARTAN 49-51 MG PO TABS: 1.0000 | ORAL_TABLET | Freq: Two times a day (BID) | ORAL | 0 refills | Status: DC

## 2019-08-07 MED ORDER — PANTOPRAZOLE SODIUM 40 MG PO TBEC: 40.0000 mg | DELAYED_RELEASE_TABLET | Freq: Every day | ORAL | 0 refills | Status: DC

## 2019-08-07 MED ORDER — NICOTINE POLACRILEX 4 MG MT GUM: 4.0000 mg | CHEWING_GUM | OROMUCOSAL | 0 refills | Status: DC | PRN

## 2019-08-07 MED ORDER — NICOTINE 21 MG/24HR TD PT24: 21.0000 mg | MEDICATED_PATCH | TRANSDERMAL | 0 refills | Status: DC

## 2019-08-07 MED ORDER — POTASSIUM CHLORIDE CRYS ER 20 MEQ PO TBCR
40.0000 meq | EXTENDED_RELEASE_TABLET | Freq: Once | ORAL | Status: AC
  Administered 2019-08-07: 40 meq via ORAL
  Filled 2019-08-07: qty 2

## 2019-08-07 MED ORDER — EZETIMIBE 10 MG PO TABS: 10.0000 mg | ORAL_TABLET | Freq: Every day | ORAL | 0 refills | Status: DC

## 2019-08-07 MED ORDER — ROSUVASTATIN CALCIUM 10 MG PO TABS: 10.0000 mg | ORAL_TABLET | Freq: Every day | ORAL | 0 refills | Status: DC

## 2019-08-07 NOTE — Discharge Summary (Signed)
Discharge Summary  [name] JME:[id number] DOB: 02/26/1951  PCP: Pleas Koch, NP  Admit date: 08/02/2019 Discharge date: 08/07/2019  Time spent: 40 mins  Recommendations for Outpatient Follow-up:  1. PCP follow-up in 1 week, with repeat labs 2. Cardiology as scheduled  Discharge Diagnoses:  Active Hospital Problems   Diagnosis Date Noted  . Acute ischemic stroke ([place]) 08/02/2019  . Paroxysmal atrial fibrillation ([place]) 08/06/2019  . Statin intolerance 08/03/2019  . Dysphagia   . Hypokalemia 08/02/2019  . Left bundle branch block 08/02/2019  . CKD (chronic kidney disease) stage 3, GFR 30-59 ml/min 12/13/2018  . Essential hypertension 12/19/2008  . TOBACCO ABUSE 12/19/2008    Resolved Hospital Problems  No resolved problems to display.    Discharge Condition: Stable  Diet recommendation: Heart healthy  Vitals:   08/07/19 0729 08/07/19 1135  BP: (!) 155/69   Pulse: (!) 56   Resp: 19   Temp: 98.2 F (36.8 C)   SpO2: 90% 97%    History of present illness:  69 yo female with Arnold-Chiari malformation was admitted for basal ganglia stroke on  2/16, had vfib/torsades de pointes cardiac arrest on 2/18. Pt was successfully resuscitated in 10-15 minutes and transferred to ICU. Pt was extubated in ICU shortly thereafter and was observed for 24 hours. Patient was seen by Cardiology and was noted to have new LBBB. Echo reveals dilated cardiomyopathy with EF 25-30%. There is a suspicion that her stroke may have been embolic in nature. Cardiac cath revealed 70-80% RCA lesion that does not appear unstable and she was diagnosed with nonischemic cardiomyopathy. She was subsequently noted to have Paroxysmal Afib, ChadsVasc2 score 6. Pt was started on Eliquis and carvedilol. Neurology signed off. TRH assumed care on 08/06/19.    Today, patient continues to feel better, denies any left-sided chest pain, shortness of breath, abdominal pain, nausea/vomiting, cough,  fever/chills.  Patient was able to ambulate the entire hallway, completely asymptomatic, sats well above 90%.  Patient advised extensively to be compliant with her medications and diet, encouraged to quit smoking and follow-up with her appointments very closely.  Patient advised to be adherent to her LifeVest provided.  Discharge patient on home health PT/OT/SLP     Hospital Course:  Principal Problem:   Acute ischemic stroke [facility]) Active Problems:   TOBACCO ABUSE   Essential hypertension   CKD (chronic kidney disease) stage 3, GFR 30-59 ml/min   Hypokalemia   Left bundle branch block   Statin intolerance   Dysphagia   Paroxysmal atrial fibrillation (HCC)  Acute ischemic infarct of the right basal ganglia MRI showed above Normal intra-cranial MRA Echo showed severely depressed LVEF with global hypokinesis, no apical thrombus, EF 25 to 30% Carotid Doppler showed bilateral mild plaques and 1 to 39% carotid stenosis LDL 175, A1c 5.7 Neurology consulted, continue aspirin, Eliquis, statins  V. fib/torsades de pointes cardiac arrest Status post ACLS protocol, CPR, defibrillated and intubated Currently stable Patient discharged home on LifeVest, encouraged to be compliant Follow-up with cardiology as scheduled  New onset atypical LBBB/dilated cardiomyopathy Echo showed severely depressed LVEF with global hypokinesis, no apical thrombus, EF 25 to 30% Cardiology on board, d/c on Entresto, Coreg, Aldactone  Paroxysmal A. Fib Currently rate controlled CHA2DS2- VASc score 7 Continue coreg, eliquis  Hypertension Stable Continue meds as above  Hyperlipidemia/peripheral vascular disease LDL 175 Noted absent bilateral pedal pulses Likely due to heavy tobacco abuse, currently denies any claudication, reports cold feet Pt may need vascular surgery likely  as an outpatient Unable to tolerate Lipitor, Zocor, switched to Crestor Follow up with PCP  Dysphagia HH SLP Recommend  dysphagia 3, honey thick  Tobacco abuse Heavy tobacco use (about 3PPD) Discussed extensively about the need to quit Patient verbalized understanding Nicotine patch/gum ordered  Hx of anxiety Pt reported heavy tobacco abuse use due to anxiety issues Planned to start pt on ?wellbutrin but due to recent Vfib/torsades will want to avoid it PCP to further evaluate for a safer option, in the event anxiety worsens or persists       Malnutrition Type:      Malnutrition Characteristics:      Nutrition Interventions:      Estimated body mass index is 27.28 kg/m as calculated from the following:   Height as of this encounter: 5\' 4"  (1.626 m).   Weight as of this encounter: 72.1 kg.    Procedures:  Mechanical ventilation  Consultations:  Cardiology  Neurology  PCCM    Discharge Exam: BP (!) 155/69 (BP Location: Right Arm)   Pulse (!) 56   Temp 98.2 F (36.8 C) (Oral)   Resp 19   Ht 5\' 4"  (1.626 m)   Wt 72.1 kg   SpO2 97%   BMI 27.28 kg/m   General: NAD Cardiovascular: S1, S2 present Respiratory: CTA B  Discharge Instructions You were cared for by a hospitalist during your hospital stay. If you have any questions about your discharge medications or the care you received while you were in the hospital after you are discharged, you can call the unit and asked to speak with the hospitalist on call if the hospitalist that took care of you is not available. Once you are discharged, your primary care physician will handle any further medical issues. Please note that NO REFILLS for any discharge medications will be authorized once you are discharged, as it is imperative that you return to your primary care physician (or establish a relationship with a primary care physician if you do not have one) for your aftercare needs so that they can reassess your need for medications and monitor your lab values.  Discharge Instructions    Diet - low sodium heart healthy    Complete by: As directed    Increase activity slowly   Complete by: As directed      Allergies as of 08/07/2019      Reactions   Shellfish-derived Products Anaphylaxis   Ativan [lorazepam] Other (See Comments)   Makes "skin crawl" and insomnia   Buspar [buspirone] Nausea Only   Sweating and dizzy   Clarithromycin Swelling   Codeine Nausea And Vomiting   Doxycycline Other (See Comments)   Unknown   Guaifenesin Other (See Comments)   Palpitations   Oxycodone Nausea And Vomiting   Prednisone Nausea And Vomiting, Other (See Comments)   Makes my heart race   Statins Other (See Comments)   Muscle pain   Sulfonamide Derivatives Nausea And Vomiting   Achiness   Tetracycline Nausea Only   Vicodin [hydrocodone-acetaminophen] Nausea And Vomiting   Famotidine Rash   Latex Rash   Omeprazole Nausea Only   Cough, shortness of breath - patient doesn't remember   Peanut-containing Drug Products Itching, Rash   Wellbutrin [bupropion] Palpitations      Medication List    STOP taking these medications   amLODipine-benazepril 5-20 MG capsule Commonly known as: LOTREL     TAKE these medications   acetaminophen 500 MG tablet Commonly known as: TYLENOL Take 1,000 mg  by mouth 2 (two) times daily as needed (pain).   apixaban 5 MG Tabs tablet Commonly known as: ELIQUIS Take 1 tablet (5 mg total) by mouth 2 (two) times daily.   aspirin 81 MG chewable tablet Chew 1 tablet (81 mg total) by mouth daily. Start taking on: August 08, 2019   carvedilol 6.25 MG tablet Commonly known as: COREG Take 1 tablet (6.25 mg total) by mouth 2 (two) times daily with a meal.   docusate sodium 100 MG capsule Commonly known as: COLACE Take 100 mg by mouth daily as needed for mild constipation.   ezetimibe 10 MG tablet Commonly known as: ZETIA Take 1 tablet (10 mg total) by mouth daily. Start taking on: August 08, 2019   loratadine 10 MG tablet Commonly known as: CLARITIN Take 10 mg by mouth  daily as needed for allergies.   nicotine 21 mg/24hr patch Commonly known as: NICODERM CQ - dosed in mg/24 hours Place 1 patch (21 mg total) onto the skin daily.   nicotine polacrilex 4 MG gum Commonly known as: NICORETTE Take 1 each (4 mg total) by mouth as needed for smoking cessation.   pantoprazole 40 MG tablet Commonly known as: PROTONIX Take 1 tablet (40 mg total) by mouth daily at 12 noon.   potassium chloride 10 MEQ tablet Commonly known as: KLOR-CON Take 1 tablet (10 mEq total) by mouth daily for 5 days.   rosuvastatin 10 MG tablet Commonly known as: CRESTOR Take 1 tablet (10 mg total) by mouth daily at 6 PM.   sacubitril-valsartan 49-51 MG Commonly known as: ENTRESTO Take 1 tablet by mouth 2 (two) times daily.   spironolactone 25 MG tablet Commonly known as: ALDACTONE Take 0.5 tablets (12.5 mg total) by mouth daily. Start taking on: August 08, 2019            Durable Medical Equipment  (From admission, onward)         Start     Ordered   08/06/19 1307  For home use only DME Vest life vest  Once    Comments: VF arrest and non ischemic cardiomyopathy.   08/05/19 1308         Allergies  Allergen Reactions  . Shellfish-Derived Products Anaphylaxis  . Ativan [Lorazepam] Other (See Comments)    Makes "skin crawl" and insomnia  . Buspar [Buspirone] Nausea Only    Sweating and dizzy  . Clarithromycin Swelling  . Codeine Nausea And Vomiting  . Doxycycline Other (See Comments)    Unknown  . Guaifenesin Other (See Comments)    Palpitations  . Oxycodone Nausea And Vomiting  . Prednisone Nausea And Vomiting and Other (See Comments)    Makes my heart race  . Statins Other (See Comments)    Muscle pain  . Sulfonamide Derivatives Nausea And Vomiting    Achiness  . Tetracycline Nausea Only  . Vicodin [Hydrocodone-Acetaminophen] Nausea And Vomiting  . Famotidine Rash  . Latex Rash  . Omeprazole Nausea Only    Cough, shortness of breath - patient  doesn't remember  . Peanut-Containing Drug Products Itching and Rash  . Wellbutrin [Bupropion] Palpitations   Follow-up Information    Tolia, Sunit, DO Follow up on 08/12/2019.   Specialties: Cardiology, Radiology, Vascular Surgery Why: 10 AM and bring all medications Contact information: [place] [address] [phone]        Pleas Koch, NP. Schedule an appointment as soon as possible for a visit in 1  week(s).   Specialty: Internal Medicine Contact information: [place] [address] [phone]            The results of significant diagnostics from this hospitalization (including imaging, microbiology, ancillary and laboratory) are listed below for reference.    Significant Diagnostic Studies: DG Abd 1 View  Result Date: 08/04/2019 CLINICAL DATA:  OG tube placement EXAM: ABDOMEN - 1 VIEW COMPARISON:  None. FINDINGS: Enteric tube tip overlies the distal stomach. Endotracheal tube is now 3 cm above the carina. Unremarkable bowel gas pattern. IMPRESSION: Enteric tube tip overlies distal stomach. Electronically Signed   By: Macy Mis M.D.   On: 08/04/2019 08:49   CT HEAD WO CONTRAST  Result Date: 08/04/2019 CLINICAL DATA:  Neuro deficit with stroke suspected EXAM: CT HEAD WITHOUT CONTRAST TECHNIQUE: Contiguous axial images were obtained from the base of the skull through the vertex without intravenous contrast. 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 No evidence of infarct progression or hemorrhagic transformation. Suboccipital craniectomy. No hydrocephalus or masslike finding. History of Chiari malformation. Vascular: No hyperdense vessel. Skull: Suboccipital craniectomy Sinuses/Orbits: Negative IMPRESSION: 1. No acute finding when compared to MRI 2 days ago. 2. Recent right basal ganglia perforator infarct without hemorrhage or visible progression.  Electronically Signed   By: Monte Fantasia M.D.   On: 08/04/2019 11:04   CT Head Wo Contrast  Result Date: 08/02/2019 CLINICAL DATA:  Transient ischemic attack (TIA). Maxillofacial pain. Additional history provided: Patient presents for left-sided facial pressure and dizziness, pressure feeling radiates to left side of face and to ear. EXAM: CT HEAD WITHOUT CONTRAST CT MAXILLOFACIAL WITHOUT CONTRAST TECHNIQUE: Contiguous axial images were obtained from the base of the skull through the vertex without intravenous contrast. Multidetector CT imaging of the maxillofacial structures was performed. Multiplanar CT image reconstructions were also generated. A small metallic BB was placed on the right temple in order to reliably differentiate right from left. COMPARISON:  No pertinent prior studies available for comparison. FINDINGS: CT HEAD FINDINGS Brain: Streak artifact limits evaluation of the inferomedial cerebellum. There is no evidence of acute intracranial hemorrhage. No demarcated cortical infarction. No evidence of intracranial mass. No midline shift or extra-axial fluid collection. There is a small region of ill-defined hypodensity the right frontal lobe white matter (series 4, images 20-24). Age-indeterminate lacunar infarct within the right lentiform nucleus/external capsule (series 4, images 16 and 17). Small chronic lacunar infarct within the right caudate. 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 Mild generalized parenchymal atrophy. Vascular: No hyperdense vessel.  Atherosclerotic calcifications Skull: Sequela of prior posterior fossa decompression, including a postsurgical defect within the C1 posterior arch. No calvarial fracture or aggressive osseous lesion. CT MAXILLOFACIAL FINDINGS Osseous: No maxillofacial fracture. Small bony exostosis along the superficial aspect of the posterior right mandibular body (series 5, image 29). Orbits: No abnormality identified. Sinuses: No  significant paranasal sinus disease. Soft tissues: Carotid artery calcified plaque. The visualized maxillofacial and upper neck soft tissues are otherwise unremarkable. Streak artifact from dental restoration significant limits evaluation of the oral cavity and also somewhat limits evaluation of the oropharynx. Within this limitation, there is no appreciable mass or swelling within the oral cavity, pharynx or larynx. Other: The temporomandibular joints are unremarkable. Left mastoid effusion. IMPRESSION: CT head: 1. Age-indeterminate lacunar infarct within the right lentiform nucleus/external capsule. 2. Small focus of ill-defined hypoattenuation within the right frontal lobe subcortical white matter  which may reflect chronic ischemic change. A recent white matter infarct at this site cannot be excluded. 3. Chronic lacunar infarct within the right caudate. 4. 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 5. Mild generalized parenchymal atrophy. CT maxillofacial: 1. Streak artifact from dental restoration limits evaluation of the oral cavity and oropharynx. Within this limitation, no swelling or discrete mass is appreciated within the oral cavity or pharynx. 2. Small left mastoid effusion. Electronically Signed   By: Kellie Simmering DO   On: 08/02/2019 18:50   MR ANGIO HEAD WO CONTRAST  Result Date: 08/02/2019 CLINICAL DATA:  Left-sided facial numbness EXAM: MRI HEAD WITHOUT CONTRAST MRA HEAD WITHOUT CONTRAST TECHNIQUE: Multiplanar, multiecho pulse sequences of the brain and surrounding structures were obtained without intravenous contrast. Angiographic images of the head were obtained using MRA technique without contrast. COMPARISON:  08/02/2019 FINDINGS: MRI HEAD FINDINGS Brain: There is an area of abnormal diffusion restriction within the right basal ganglia. Multifocal white matter hyperintensity, most commonly due to chronic ischemic microangiopathy. Normal volume of CSF spaces. No chronic  microhemorrhage. Normal midline structures. Vascular: Normal flow voids. Skull and upper cervical spine: Normal marrow signal. Sinuses/Orbits: Small amount of left mastoid fluid. Sinuses are clear. Normal orbits. Other: None MRA HEAD FINDINGS POSTERIOR CIRCULATION: --Vertebral arteries: Normal V4 segments. --Posterior inferior cerebellar arteries (PICA): Patent origins from the vertebral arteries. --Anterior inferior cerebellar arteries (AICA): Patent origins from the basilar artery. --Basilar artery: Normal. --Superior cerebellar arteries: Normal. --Posterior cerebral arteries: Normal. Both originate from the basilar artery. Posterior communicating arteries (p-comm) are diminutive or absent. ANTERIOR CIRCULATION: --Intracranial internal carotid arteries: Normal. --Anterior cerebral arteries (ACA): Normal. Both A1 segments are present. Patent anterior communicating artery (a-comm). --Middle cerebral arteries (MCA): Normal. IMPRESSION: 1. 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 No hemorrhage or mass effect. 2. Normal intracranial MRA. Electronically Signed   By: Ulyses Jarred M.D.   On: 08/02/2019 22:02   MR BRAIN WO CONTRAST  Result Date: 08/02/2019 CLINICAL DATA:  Left-sided facial numbness EXAM: MRI HEAD WITHOUT CONTRAST MRA HEAD WITHOUT CONTRAST TECHNIQUE: Multiplanar, multiecho pulse sequences of the brain and surrounding structures were obtained without intravenous contrast. Angiographic images of the head were obtained using MRA technique without contrast. COMPARISON:  08/02/2019 FINDINGS: MRI HEAD FINDINGS Brain: There is an area of abnormal diffusion restriction within the right basal ganglia. Multifocal white matter hyperintensity, most commonly due to chronic ischemic microangiopathy. Normal volume of CSF spaces. No chronic microhemorrhage. Normal midline structures. Vascular: Normal flow voids. Skull and upper cervical spine: Normal marrow signal. Sinuses/Orbits: Small amount of left  mastoid fluid. Sinuses are clear. Normal orbits. Other: None MRA HEAD FINDINGS POSTERIOR CIRCULATION: --Vertebral arteries: Normal V4 segments. --Posterior inferior cerebellar arteries (PICA): Patent origins from the vertebral arteries. --Anterior inferior cerebellar arteries (AICA): Patent origins from the basilar artery. --Basilar artery: Normal. --Superior cerebellar arteries: Normal. --Posterior cerebral arteries: Normal. Both originate from the basilar artery. Posterior communicating arteries (p-comm) are diminutive or absent. ANTERIOR CIRCULATION: --Intracranial internal carotid arteries: Normal. --Anterior cerebral arteries (ACA): Normal. Both A1 segments are present. Patent anterior communicating artery (a-comm). --Middle cerebral arteries (MCA): Normal. IMPRESSION: 1. 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 No hemorrhage or mass effect. 2. Normal intracranial MRA. Electronically Signed   By: Ulyses Jarred M.D.   On: 08/02/2019 22:02   CARDIAC CATHETERIZATION  Result Date: 08/04/2019 Left Heart Catheterization 08/04/19: LV: Global hypokinesis, upper limit of normal size, EF 25 to 30%. Left main: Normal. LAD: Mild  diffuse disease.  Proximal LAD has a 30 to 40% stenosis, mid segment has a 20 to 30% stenosis, scattered disease noted in the LAD.  Brisk flow. Circumflex: Again scattered disease noted in the circumflex.  Mid segment has at most a 30 to 40% stenosis which appears to be eccentric and calcified. RCA: Dominant.  Mild diffuse disease again noted.  Mid segment after the origin of RV branch has a 70 to 80% stenosis.  Brisk flow is evident throughout the RCA. Impression: Findings consistent with nonischemic cardiomyopathy.  Although she has significant disease in the right coronary artery, this does not explain her presentation with global hypokinesis, neither does it explain VF arrest as the lesion does not appear to be unstable. 35 mL contrast used.   DG Chest Port 1 View  Result  Date: 08/06/2019 CLINICAL DATA:  Status update EXAM: PORTABLE CHEST 1 VIEW COMPARISON:  Two days ago FINDINGS: Extubation with stable inflation. Long-standing calcified density over the right scapula, seen since 2010 at least. Generous heart size. There is no edema, consolidation, effusion, or pneumothorax. IMPRESSION: Clear lungs after extubation. Electronically Signed   By: Monte Fantasia M.D.   On: 08/06/2019 07:27   DG CHEST PORT 1 VIEW  Result Date: 08/04/2019 CLINICAL DATA:  Respiratory arrest EXAM: PORTABLE CHEST 1 VIEW COMPARISON:  02/10/2017 FINDINGS: Endotracheal tube with tip 10 mm above the carina. Symmetric low volume lungs with mild interstitial coarsening. There is no edema, consolidation, effusion, or pneumothorax. Normal heart size and mediastinal contours. IMPRESSION: 1. Endotracheal tube with tip 10 mm above the carina. 2. Symmetric low volume lungs. Electronically Signed   By: Monte Fantasia M.D.   On: 08/04/2019 07:11   DG Swallowing Func-Speech Pathology  Result Date: 08/05/2019 Objective Swallowing Evaluation: Type of Study: MBS-Modified Barium Swallow Study  Patient Details Name: STARLYNN KLINKNER MRN: [id number] Date of Birth: 1950-09-06 Today's Date: 08/05/2019 Time: SLP Start Time (ACUTE ONLY): 2542 -SLP Stop Time (ACUTE ONLY): 1100 SLP Time Calculation (min) (ACUTE ONLY): 18 min Past Medical History: Past Medical History: Diagnosis Date . Allergy  . Anxiety  . Arnold-Chiari malformation ([place])  . Arthritis  . GERD (gastroesophageal reflux disease)  . H. pylori infection   3 years ago . Hyperlipidemia  . Incontinence  . Syncope and collapse   Per pt, denies passing out . Tobacco use disorder  . Unspecified essential hypertension  Past Surgical History: Past Surgical History: Procedure Laterality Date . APPENDECTOMY   . ARNOLD CHIARI SURGERY    neurocranial surgery . BLEPHAROPLASTY    Bil . C-EYE SURGERY PROCEDURE   . CHOLECYSTECTOMY   . EYE SURGERY   . LEFT HEART CATH AND CORONARY  ANGIOGRAPHY N/A 08/04/2019  Procedure: LEFT HEART CATH AND CORONARY ANGIOGRAPHY;  Surgeon: Adrian Prows, MD;  Location: [facility];  Service: Cardiovascular;  Laterality: N/A; . MASS EXCISION Right 12/27/2013  Procedure: MINOR EXCISION OF RIGHT THUMB MUCOID CYST, DEBRIDEMENT OF INTERPHALANGEAL JOINT;  Surgeon: Cammie Sickle, MD;  Location: [facility];  Service: Orthopedics;  Laterality: Right; . mass on thumb  right . TOTAL KNEE ARTHROPLASTY Right 02/17/2017 . TOTAL KNEE ARTHROPLASTY Right 02/17/2017  Procedure: TOTAL KNEE ARTHROPLASTY;  Surgeon: Melrose Nakayama, MD;  Location: [facility];  Service: Orthopedics;  Laterality: Right; . TUBAL LIGATION   HPI: [name] is a 69 y.o. female with medical history significant for anxiety, hypertension, and Chiari I malformation, presented to the emergency department with difficulty swallowing, dizziness, and left  facial pressure on 08/02/19. 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 ST recommended MBS to be completed 2/28 however morning of September 01, 2022 pt had Vfib, coded and intubated for several hours.   Subjective: "I'm not trying to be here too long" Assessment / Plan / Recommendation CHL IP CLINICAL IMPRESSIONS 08/05/2019 Clinical Impression Pt exhibits moderate oropharyngeal dysphagia marked by discoordination and mistiming of swallowing sequence and suspected esophageal disturbance. Orally, barium fell under tongue as she manipulated and propelled but able to clear oral cavity. Timing of epiglottic deflection was late with barium passing epiglottis then pushed into laryngeal vestibule and aspirated with thin and nectar consistencies and penetration with honey thick. She sensed only one of two aspiration episodes. Tucking of chin reduced airway compromise to flash with nectar however pt taking large sips and ability to consistently is questionable. Minimal vallecular residue present. Pt reported symptoms of reflux/esophageal  difficulties for "a long time". Esophageal scan revealed barium retention at mid esophagus. Recommend continue Dys 3, downgrade liquids to honey, pills whole in puree, stay upright after meals.     SLP Visit Diagnosis Dysphagia, oropharyngeal phase (R13.12) Attention and concentration deficit following -- Frontal lobe and executive function deficit following -- Impact on safety and function Moderate aspiration risk   CHL IP TREATMENT RECOMMENDATION 08/05/2019 Treatment Recommendations Therapy as outlined in treatment plan below   Prognosis 08/05/2019 Prognosis for Safe Diet Advancement Good Barriers to Reach Goals -- Barriers/Prognosis Comment -- CHL IP DIET RECOMMENDATION 08/05/2019 SLP Diet Recommendations Dysphagia 3 (Mech soft) solids;Honey thick liquids Liquid Administration via Cup Medication Administration Whole meds with puree Compensations Slow rate;Small sips/bites;Clear throat intermittently Postural Changes Remain semi-upright after after feeds/meals (Comment);Seated upright at 90 degrees   CHL IP OTHER RECOMMENDATIONS 08/05/2019 Recommended Consults -- Oral Care Recommendations Oral care BID Other Recommendations Order thickener from pharmacy   CHL IP FOLLOW UP RECOMMENDATIONS 08/05/2019 Follow up Recommendations Other (comment)   CHL IP FREQUENCY AND DURATION 08/05/2019 Speech Therapy Frequency (ACUTE ONLY) min 2x/week Treatment Duration 2 weeks      CHL IP ORAL PHASE 08/05/2019 Oral Phase Impaired Oral - Pudding Teaspoon -- Oral - Pudding Cup -- Oral - Honey Teaspoon -- Oral - Honey Cup Decreased bolus cohesion Oral - Nectar Teaspoon -- Oral - Nectar Cup Decreased bolus cohesion Oral - Nectar Straw -- Oral - Thin Teaspoon -- Oral - Thin Cup Decreased bolus cohesion Oral - Thin Straw -- Oral - Puree -- Oral - Mech Soft -- Oral - Regular (No Data) Oral - Multi-Consistency -- Oral - Pill -- Oral Phase - Comment --  CHL IP PHARYNGEAL PHASE 08/05/2019 Pharyngeal Phase Impaired Pharyngeal- Pudding Teaspoon --  Pharyngeal -- Pharyngeal- Pudding Cup -- Pharyngeal -- Pharyngeal- Honey Teaspoon -- Pharyngeal -- Pharyngeal- Honey Cup Penetration/Aspiration during swallow;Pharyngeal residue - valleculae Pharyngeal Material enters airway, remains ABOVE vocal cords then ejected out Pharyngeal- Nectar Teaspoon -- Pharyngeal -- Pharyngeal- Nectar Cup Penetration/Aspiration during swallow;Pharyngeal residue - valleculae Pharyngeal Material enters airway, passes BELOW cords without attempt by patient to eject out (silent aspiration) Pharyngeal- Nectar Straw -- Pharyngeal -- Pharyngeal- Thin Teaspoon -- Pharyngeal -- Pharyngeal- Thin Cup Penetration/Aspiration during swallow Pharyngeal Material enters airway, passes BELOW cords and not ejected out despite cough attempt by patient Pharyngeal- Thin Straw -- Pharyngeal -- Pharyngeal- Puree -- Pharyngeal -- Pharyngeal- Mechanical Soft -- Pharyngeal -- Pharyngeal- Regular WFL Pharyngeal -- Pharyngeal- Multi-consistency -- Pharyngeal -- Pharyngeal- Pill -- Pharyngeal -- Pharyngeal Comment --  CHL IP CERVICAL ESOPHAGEAL PHASE  08/05/2019 Cervical Esophageal Phase WFL Pudding Teaspoon -- Pudding Cup -- Honey Teaspoon -- Honey Cup -- Nectar Teaspoon -- Nectar Cup -- Nectar Straw -- Thin Teaspoon -- Thin Cup -- Thin Straw -- Puree -- Mechanical Soft -- Regular -- Multi-consistency -- Pill -- Cervical Esophageal Comment -- [place] Siren 08/05/2019, 1:37 PM Orbie Pyo Colvin Caroli.Ed Actor Pager [phone] Office [phone]              ECHOCARDIOGRAM COMPLETE  Result Date: 08/03/2019    ECHOCARDIOGRAM REPORT   Patient Name:   [name] Lenk Date of Exam: 08/03/2019 Medical Rec #:  [id number]      Height:       64.0 in Accession #:    [id number]     Weight:       159.2 lb Date of Birth:  [birth year]      BSA:          1.78 m Patient Age:    62 years       BP:           143/64 mmHg Patient Gender: F              HR:           53 bpm. Exam Location:  Inpatient  Procedure: 2D Echo, Cardiac Doppler and Color Doppler Indications:    Stroke 434.91  History:        Patient has no prior history of Echocardiogram examinations.                 Stroke, Arrythmias:LBBB; Risk Factors:Hypertension, Dyslipidemia                 and Current Smoker. GERD.  Sonographer:    Vickie Epley RDCS Referring Phys: [id number] [place]  1. Severely depressed LVEF with global hypokinesis. No apical thrombus visualized on this study, however would consider repeat limited echo with contrast to exclude LV thrombus. Could also consider TEE for further evaluation of cardiac embolism.  2. Left ventricular ejection fraction, by estimation, is 25 to 30%. The left ventricle has severely decreased function. The left ventricle demonstrates global hypokinesis. There is moderate concentric left ventricular hypertrophy. Left ventricular diastolic parameters are indeterminate. Elevated left ventricular end-diastolic pressure.  3. Right ventricular systolic function is normal. The right ventricular size is normal.  4. The mitral valve is normal in structure and function. Mild mitral valve regurgitation.  5. The aortic valve is tricuspid. Aortic valve regurgitation is not visualized. No aortic stenosis is present.  6. The inferior vena cava is dilated in size with <50% respiratory variability, suggesting right atrial pressure of 15 mmHg. FINDINGS  Left Ventricle: Left ventricular ejection fraction, by estimation, is 25 to 30%. The left ventricle has severely decreased function. The left ventricle demonstrates global hypokinesis. The left ventricular internal cavity size was normal in size. There is moderate concentric left ventricular hypertrophy. Left ventricular diastolic parameters are indeterminate. Elevated left ventricular end-diastolic pressure. Right Ventricle: The right ventricular size is normal. No increase in right ventricular wall thickness. Right ventricular systolic function is normal.  Left Atrium: Left atrial size was normal in size. Right Atrium: Right atrial size was normal in size. Pericardium: There is no evidence of pericardial effusion. Mitral Valve: The mitral valve is normal in structure and function. Mild mitral valve regurgitation. Tricuspid Valve: The tricuspid valve is normal in structure. Tricuspid valve regurgitation is trivial. No evidence of tricuspid stenosis. Aortic Valve: The aortic  valve is tricuspid. Aortic valve regurgitation is not visualized. No aortic stenosis is present. Pulmonic Valve: The pulmonic valve was not well visualized. Pulmonic valve regurgitation is not visualized. No evidence of pulmonic stenosis. Aorta: The aortic root and ascending aorta are structurally normal, with no evidence of dilitation. Venous: The inferior vena cava is dilated in size with less than 50% respiratory variability, suggesting right atrial pressure of 15 mmHg. IAS/Shunts: No atrial level shunt detected by color flow Doppler.  LEFT VENTRICLE PLAX 2D LVIDd:         4.51 cm      Diastology LVIDs:         3.70 cm      LV e' lateral:   3.57 cm/s LV PW:         1.59 cm      LV E/e' lateral: 15.4 LV IVS:        1.47 cm      LV e' medial:    2.61 cm/s LVOT diam:     2.00 cm      LV E/e' medial:  21.1 LV SV:         66.60 ml LV SV Index:   19.08 LVOT Area:     3.14 cm  LV Volumes (MOD) LV vol d, MOD A2C: 208.0 ml LV vol d, MOD A4C: 200.0 ml LV vol s, MOD A2C: 123.0 ml LV vol s, MOD A4C: 132.0 ml LV SV MOD A2C:     85.0 ml LV SV MOD A4C:     200.0 ml LV SV MOD BP:      77.0 ml RIGHT VENTRICLE RV S prime:     8.32 cm/s TAPSE (M-mode): 2.4 cm LEFT ATRIUM             Index       RIGHT ATRIUM           Index LA diam:        3.40 cm 1.92 cm/m  RA Area:     10.80 cm LA Vol (A2C):   52.9 ml 29.80 ml/m RA Volume:   26.70 ml  15.04 ml/m LA Vol (A4C):   41.7 ml 23.49 ml/m LA Biplane Vol: 48.8 ml 27.49 ml/m  AORTIC VALVE LVOT Vmax:   96.00 cm/s LVOT Vmean:  57.600 cm/s LVOT VTI:    0.212 m  AORTA Ao  Root diam: 3.10 cm MITRAL VALVE MV Area (PHT): 2.05 cm     SHUNTS MV Decel Time: 370 msec     Systemic VTI:  0.21 m MV E velocity: 55.10 cm/s   Systemic Diam: 2.00 cm MV A velocity: 125.00 cm/s MV E/A ratio:  0.44 Buford Dresser MD Electronically signed by Buford Dresser MD Signature Date/Time: 08/03/2019/8:56:44 PM    Final    ECHOCARDIOGRAM LIMITED BUBBLE STUDY  Result Date: 08/04/2019    ECHOCARDIOGRAM LIMITED REPORT   Patient Name:   [name] Bondar Date of Exam: 08/04/2019 Medical Rec #:  [id number]      Height:       64.0 in Accession #:    [id number]     Weight:       159.0 lb Date of Birth:  08-23-1950      BSA:          1.77 m Patient Age:    6 years       BP:           96/63 mmHg Patient Gender: F  HR:           121 bpm. Exam Location:  Inpatient Procedure: Limited Echo, Color Doppler, Cardiac Doppler, Saline Contrast Bubble            Study and Intracardiac Opacification Agent STAT ECHO Indications:    Cardiac Arrest, Stroke  History:        Patient has prior history of Echocardiogram examinations, most                 recent 08/03/2019. Risk Factors:Hypertension and Dyslipidemia.  Sonographer:    Raquel Sarna Senior RDCS Referring Phys: [id number] Candee Furbish  Sonographer Comments: Limited to assess for cardiac source of thrombus or shunting. IMPRESSIONS  1. Left ventricular ejection fraction, by estimation, is 25 to 30%. The left ventricle has severely decreased function. The left ventricle demonstrates global hypokinesis. There was marked septal-lateral dyssynchrony consistent with LBBB. No LV thrombus  noted on Definity study.  2. Right ventricular systolic function is normal. The right ventricular size is normal. There is normal pulmonary artery systolic pressure.  3. The aortic valve is tricuspid. Aortic valve regurgitation is not visualized. No aortic stenosis is present.  4. The inferior vena cava is normal in size with <50% respiratory variability, suggesting right atrial  pressure of 8 mmHg.  5. Bubble study appeared negative for shunt lesion but was a difficult study due to poor windows.  6. Limited echo. FINDINGS  Left Ventricle: Left ventricular ejection fraction, by estimation, is 25 to 30%. The left ventricle has severely decreased function. The left ventricle demonstrates global hypokinesis. Definity contrast agent was given IV to delineate the left ventricular endocardial borders. The left ventricular internal cavity size was normal in size. There is no left ventricular hypertrophy. Abnormal (paradoxical) septal motion, consistent with left bundle branch block. Right Ventricle: The right ventricular size is normal. No increase in right ventricular wall thickness. Right ventricular systolic function is normal. There is normal pulmonary artery systolic pressure. The tricuspid regurgitant velocity is 2.21 m/s, and  with an assumed right atrial pressure of 8 mmHg, the estimated right ventricular systolic pressure is 97.9 mmHg. Left Atrium: Left atrial size was normal in size. Right Atrium: Right atrial size was normal in size. Tricuspid Valve: The tricuspid valve is normal in structure. Tricuspid valve regurgitation is trivial. Aortic Valve: The aortic valve is tricuspid. Aortic valve regurgitation is not visualized. No aortic stenosis is present. Pulmonic Valve: The pulmonic valve was normal in structure. Pulmonic valve regurgitation is not visualized. Venous: The inferior vena cava is normal in size with less than 50% respiratory variability, suggesting right atrial pressure of 8 mmHg. IAS/Shunts: Agitated saline contrast was given intravenously to evaluate for intracardiac shunting.  RIGHT VENTRICLE RV S prime:     9.14 cm/s TAPSE (M-mode): 1.9 cm AORTIC VALVE LVOT Vmax:   75.00 cm/s LVOT Vmean:  53.300 cm/s LVOT VTI:    0.142 m TRICUSPID VALVE TR Peak grad:   19.5 mmHg TR Vmax:        221.00 cm/s  SHUNTS Systemic VTI: 0.14 m Loralie Champagne MD Electronically signed by Loralie Champagne MD Signature Date/Time: 08/04/2019/10:47:14 AM    Final    VAS US CAROTID (at [facility] and [facility] only)  Result Date: 08/03/2019 Carotid Arterial Duplex Study Indications: CVA. Performing Technologist: Maudry Mayhew MHA, RDMS, RVT, RDCS  Examination Guidelines: A complete evaluation includes B-mode imaging, spectral Doppler, color Doppler, and power Doppler as needed of all accessible portions of each vessel. Bilateral testing is  considered an integral part of a complete examination. Limited examinations for reoccurring indications may be performed as noted.  Right Carotid Findings: +----------+--------+--------+--------+--------------------------+--------+           PSV cm/sEDV cm/sStenosisPlaque Description        Comments +----------+--------+--------+--------+--------------------------+--------+ CCA Prox  74      17                                                 +----------+--------+--------+--------+--------------------------+--------+ CCA Distal74      12              heterogenous and smooth            +----------+--------+--------+--------+--------------------------+--------+ ICA Prox  55      17              heterogenous and irregular         +----------+--------+--------+--------+--------------------------+--------+ ICA Distal84      26                                                 +----------+--------+--------+--------+--------------------------+--------+ ECA       111     11              smooth and heterogenous            +----------+--------+--------+--------+--------------------------+--------+ +----------+--------+-------+----------------+-------------------+           PSV cm/sEDV cmsDescribe        Arm Pressure (mmHG) +----------+--------+-------+----------------+-------------------+ [id number]            Multiphasic, WNL                    +----------+--------+-------+----------------+-------------------+  +---------+--------+--+--------+-+---------+ VertebralPSV cm/s27EDV cm/s7Antegrade +---------+--------+--+--------+-+---------+  Left Carotid Findings: +----------+--------+--------+--------+-----------------------+--------+           PSV cm/sEDV cm/sStenosisPlaque Description     Comments +----------+--------+--------+--------+-----------------------+--------+ CCA Prox  74      12              smooth and heterogenous         +----------+--------+--------+--------+-----------------------+--------+ CCA Distal81      18                                              +----------+--------+--------+--------+-----------------------+--------+ ICA Prox  91      15              smooth and heterogenous         +----------+--------+--------+--------+-----------------------+--------+ ICA Distal66      13                                              +----------+--------+--------+--------+-----------------------+--------+ ECA       98      10              smooth and heterogenous         +----------+--------+--------+--------+-----------------------+--------+ +----------+--------+--------+----------------+-------------------+           PSV cm/sEDV cm/sDescribe  Arm Pressure (mmHG) +----------+--------+--------+----------------+-------------------+ [id number]             Multiphasic, WNL                    +----------+--------+--------+----------------+-------------------+ +---------+--------+--+--------+--+---------+ VertebralPSV cm/s59EDV cm/s13Antegrade +---------+--------+--+--------+--+---------+   Summary: Right Carotid: Velocities in the right ICA are consistent with a 1-39% stenosis. Left Carotid: Velocities in the left ICA are consistent with a 1-39% stenosis. Vertebrals:  Bilateral vertebral arteries demonstrate antegrade flow. Subclavians: Normal flow hemodynamics were seen in bilateral subclavian              arteries. *See table(s) above for  measurements and observations.  Electronically signed by Antony Contras MD on 08/03/2019 at 5:14:31 PM.    Final    CT Maxillofacial Wo Contrast  Result Date: 08/02/2019 CLINICAL DATA:  Transient ischemic attack (TIA). Maxillofacial pain. Additional history provided: Patient presents for left-sided facial pressure and dizziness, pressure feeling radiates to left side of face and to ear. EXAM: CT HEAD WITHOUT CONTRAST CT MAXILLOFACIAL WITHOUT CONTRAST TECHNIQUE: Contiguous axial images were obtained from the base of the skull through the vertex without intravenous contrast. Multidetector CT imaging of the maxillofacial structures was performed. Multiplanar CT image reconstructions were also generated. A small metallic BB was placed on the right temple in order to reliably differentiate right from left. COMPARISON:  No pertinent prior studies available for comparison. FINDINGS: CT HEAD FINDINGS Brain: Streak artifact limits evaluation of the inferomedial cerebellum. There is no evidence of acute intracranial hemorrhage. No demarcated cortical infarction. No evidence of intracranial mass. No midline shift or extra-axial fluid collection. There is a small region of ill-defined hypodensity the right frontal lobe white matter (series 4, images 20-24). Age-indeterminate lacunar infarct within the right lentiform nucleus/external capsule (series 4, images 16 and 17). Small chronic lacunar infarct within the right caudate. 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 Mild generalized parenchymal atrophy. Vascular: No hyperdense vessel.  Atherosclerotic calcifications Skull: Sequela of prior posterior fossa decompression, including a postsurgical defect within the C1 posterior arch. No calvarial fracture or aggressive osseous lesion. CT MAXILLOFACIAL FINDINGS Osseous: No maxillofacial fracture. Small bony exostosis along the superficial aspect of the posterior right mandibular body (series 5, image 29).  Orbits: No abnormality identified. Sinuses: No significant paranasal sinus disease. Soft tissues: Carotid artery calcified plaque. The visualized maxillofacial and upper neck soft tissues are otherwise unremarkable. Streak artifact from dental restoration significant limits evaluation of the oral cavity and also somewhat limits evaluation of the oropharynx. Within this limitation, there is no appreciable mass or swelling within the oral cavity, pharynx or larynx. Other: The temporomandibular joints are unremarkable. Left mastoid effusion. IMPRESSION: CT head: 1. Age-indeterminate lacunar infarct within the right lentiform nucleus/external capsule. 2. Small focus of ill-defined hypoattenuation within the right frontal lobe subcortical white matter which may reflect chronic ischemic change. A recent white matter infarct at this site cannot be excluded. 3. Chronic lacunar infarct within the right caudate. 4. 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 5. Mild generalized parenchymal atrophy. CT maxillofacial: 1. Streak artifact from dental restoration limits evaluation of the oral cavity and oropharynx. Within this limitation, no swelling or discrete mass is appreciated within the oral cavity or pharynx. 2. Small left mastoid effusion. Electronically Signed   By: Kellie Simmering DO   On: 08/02/2019 18:50    Microbiology: Recent Results (from the past 240 hour(s))  SARS CORONAVIRUS 2 (TAT 6-24 HRS) Nasopharyngeal Nasopharyngeal Swab  Status: None   Collection Time: 08/02/19  7:31 PM   Specimen: Nasopharyngeal Swab  Result Value Ref Range Status   SARS Coronavirus 2 NEGATIVE NEGATIVE Final    Comment: (NOTE) SARS-CoV-2 target nucleic acids are NOT DETECTED. The SARS-CoV-2 RNA is generally detectable in upper and lower respiratory specimens during the acute phase of infection. Negative results do not preclude SARS-CoV-2 infection, do not rule out co-infections with other pathogens, and  should not be used as the sole basis for treatment or other patient management decisions. Negative results must be combined with clinical observations, patient history, and epidemiological information. The expected result is Negative. Fact Sheet for Patients: SugarRoll.be Fact Sheet for Healthcare Providers: https://www.woods-mathews.com/ This test is not yet approved or cleared by the [place] FDA and  has been authorized for detection and/or diagnosis of SARS-CoV-2 by FDA under an Emergency Use Authorization (EUA). This EUA will remain  in effect (meaning this test can be used) for the duration of the COVID-19 declaration under Section 56 4(b)(1) of the Act, 21 U.S.C. section 360bbb-3(b)(1), unless the authorization is terminated or revoked sooner. Performed at [facility], [place] [address] [place], [place] [address]   Culture, blood (routine x 2)     Status: None (Preliminary result)   Collection Time: 08/04/19  6:50 AM   Specimen: BLOOD RIGHT HAND  Result Value Ref Range Status   Specimen Description BLOOD RIGHT HAND  Final   Special Requests   Final    BOTTLES DRAWN AEROBIC AND ANAEROBIC Blood Culture results may not be optimal due to an inadequate volume of blood received in culture bottles Performed at [facility], [place] [address] [place], [place] [address]    Culture NO GROWTH 2 DAYS  Final   Report Status PENDING  Incomplete  Culture, blood (routine x 2)     Status: None (Preliminary result)   Collection Time: 08/04/19  6:52 AM   Specimen: BLOOD  Result Value Ref Range Status   Specimen Description BLOOD LEFT ANTECUBITAL  Final   Special Requests   Final    BOTTLES DRAWN AEROBIC AND ANAEROBIC Blood Culture adequate volume Performed at [facility], [place] [address], [place], [place] [address]    Culture NO GROWTH 2 DAYS  Final   Report Status PENDING  Incomplete     Labs: Basic Metabolic Panel: Recent  Labs  Lab 08/03/19 0249 08/03/19 1000 08/04/19 0652 08/05/19 0303 08/05/19 1447 08/06/19 0326 08/07/19 0331  NA  --    < > 143 143 141 142 141  K  --    < > 3.2* 4.1 3.8 3.9 3.2*  CL  --    < > 110 110 110 109 105  CO2  --    < > 17* 20* 22 23 23   GLUCOSE  --    < > 226* 111* 104* 97 123*  BUN  --    < > 11 17 17 19 18   CREATININE  --    < > 1.34* 1.21* 1.15* 1.18* 1.06*  CALCIUM  --    < > 8.6* 8.8* 8.9 8.8* 8.3*  MG 1.9  --  3.5* 2.4  --  2.2 1.9  PHOS  --   --  5.0*  --   --  2.9  --    < > = values in this interval not displayed.   Liver Function Tests: Recent Labs  Lab 08/04/19 1093 08/05/19 1447 08/06/19 0326 08/07/19 0331  AST 183* 58* 43* 32  ALT 101* 62* 51* 41  ALKPHOS 77 71 66 67  BILITOT 0.9 1.2 0.9 1.2  PROT 6.2* 6.3* 5.8* 5.9*  ALBUMIN 3.5 3.4* 3.4* 3.2*   No results for input(s): LIPASE, AMYLASE in the last 168 hours. No results for input(s): AMMONIA in the last 168 hours. CBC: Recent Labs  Lab 08/02/19 2030 08/02/19 2225 08/03/19 1000 08/03/19 1000 08/04/19 0209 08/04/19 0652 08/05/19 0303 08/06/19 0326 08/07/19 0331  WBC 7.0   < > 6.6   < > 6.9 23.2* 9.7 8.6 8.5  NEUTROABS 4.2  --  4.3  --   --   --   --   --   --   HGB 14.1   < > 14.3   < > 13.7 14.6 13.2 12.7 13.0  HCT 42.6   < > 43.3   < > 41.9 45.4 39.7 38.6 39.3  MCV 88.9   < > 91.2   < > 90.5 92.7 89.8 90.2 89.5  PLT 242   < > 251   < > 224 254 200 188 199   < > = values in this interval not displayed.   Cardiac Enzymes: No results for input(s): CKTOTAL, CKMB, CKMBINDEX, TROPONINI in the last 168 hours. BNP: BNP (last 3 results) No results for input(s): BNP in the last 8760 hours.  ProBNP (last 3 results) No results for input(s): PROBNP in the last 8760 hours.  CBG: Recent Labs  Lab 08/06/19 0635 08/06/19 1210 08/06/19 1550 08/06/19 2124 08/07/19 0655  GLUCAP 103* 99 108* 101* 113*       Signed:  Alma Friendly, MD [facility] 08/07/2019, 12:18  PM

## 2019-08-07 NOTE — Progress Notes (Signed)
Pt IV and tele removed without complication. Life vest applied to patient, battery inserted, and turned on. AVS including medication list. Pt expresses understanding of all medications and instructions including follow up appointments and BE FAST acronym. Pt discharged from facility via wheelchair with all belongings including life vest box.

## 2019-08-07 NOTE — Progress Notes (Signed)
Received message from [place] with [facility] and she is not able to accept the referral. Contacted Cheryl with [facility] and she accepted the referral. She reports that she is going to check if pt has a co-payment and she will place the referral with another agency if she does have a co-payment. Contacted pt and provided [facility] information and Chery's contact #. She reports that she filled all her prescriptions.

## 2019-08-07 NOTE — Progress Notes (Signed)
Received referral to assist with HHPT/OT. MD is concerned about insurance coverage for new prescriptions. Met with pt at bedside. She plans to return home with the support of her husband. She denies any financial issues filling her prescriptions. Discussed HH. She reports that she doesn't have a preference for a [facility] agency. Contacted Jason with [facility] and he declined the referral due to staffing. Contacted Katina with [facility] and she will f/u with CM if they are able to accept the referral.

## 2019-08-07 NOTE — Progress Notes (Signed)
Increased Entresto to 49/51 mg today. If not going home, check BMP in AM, stable cardiac status and OP visit with me already in plave and Dorian Pod is life vest rep at [facility]

## 2019-08-08 ENCOUNTER — Telehealth: Payer: Self-pay

## 2019-08-08 LAB — CALCITRIOL (1,25 DI-OH VIT D): Vit D, 1,25-Dihydroxy: 34.8 pg/mL (ref 19.9–79.3)

## 2019-08-08 NOTE — Telephone Encounter (Signed)
Patient discharged from [facility] on 08/07/2019. TCM being completed by cardiology. Please forward to appointment staff if regular hospital visit is desired.

## 2019-08-08 NOTE — Telephone Encounter (Signed)
Yes, patient needs hospital follow up with me and she is already scheduled for 02/25.

## 2019-08-09 LAB — CULTURE, BLOOD (ROUTINE X 2)
Culture: NO GROWTH
Culture: NO GROWTH
Special Requests: ADEQUATE

## 2019-08-10 DIAGNOSIS — R69 Illness, unspecified: Secondary | ICD-10-CM | POA: Diagnosis not present

## 2019-08-11 ENCOUNTER — Ambulatory Visit (INDEPENDENT_AMBULATORY_CARE_PROVIDER_SITE_OTHER): Payer: Medicare HMO | Admitting: Primary Care

## 2019-08-11 ENCOUNTER — Encounter: Payer: Self-pay | Admitting: Primary Care

## 2019-08-11 ENCOUNTER — Other Ambulatory Visit: Payer: Self-pay

## 2019-08-11 VITALS — BP 100/60 | HR 70 | Temp 96.6°F | Ht 64.0 in | Wt 162.5 lb

## 2019-08-11 DIAGNOSIS — Z8673 Personal history of transient ischemic attack (TIA), and cerebral infarction without residual deficits: Secondary | ICD-10-CM | POA: Diagnosis not present

## 2019-08-11 DIAGNOSIS — R1310 Dysphagia, unspecified: Secondary | ICD-10-CM

## 2019-08-11 DIAGNOSIS — I447 Left bundle-branch block, unspecified: Secondary | ICD-10-CM | POA: Diagnosis not present

## 2019-08-11 DIAGNOSIS — I480 Paroxysmal atrial fibrillation: Secondary | ICD-10-CM

## 2019-08-11 DIAGNOSIS — E876 Hypokalemia: Secondary | ICD-10-CM

## 2019-08-11 DIAGNOSIS — I10 Essential (primary) hypertension: Secondary | ICD-10-CM

## 2019-08-11 DIAGNOSIS — F4323 Adjustment disorder with mixed anxiety and depressed mood: Secondary | ICD-10-CM

## 2019-08-11 DIAGNOSIS — R69 Illness, unspecified: Secondary | ICD-10-CM | POA: Diagnosis not present

## 2019-08-11 DIAGNOSIS — F17200 Nicotine dependence, unspecified, uncomplicated: Secondary | ICD-10-CM

## 2019-08-11 DIAGNOSIS — E785 Hyperlipidemia, unspecified: Secondary | ICD-10-CM

## 2019-08-11 DIAGNOSIS — G4700 Insomnia, unspecified: Secondary | ICD-10-CM

## 2019-08-11 LAB — BASIC METABOLIC PANEL
BUN: 18 mg/dL (ref 6–23)
CO2: 25 mEq/L (ref 19–32)
Calcium: 8.7 mg/dL (ref 8.4–10.5)
Chloride: 108 mEq/L (ref 96–112)
Creatinine, Ser: 1.32 mg/dL — ABNORMAL HIGH (ref 0.40–1.20)
GFR: 39.89 mL/min — ABNORMAL LOW (ref >60.00)
Glucose, Bld: 159 mg/dL — ABNORMAL HIGH (ref 70–99)
Potassium: 3.8 mEq/L (ref 3.5–5.1)
Sodium: 142 mEq/L (ref 135–145)

## 2019-08-11 NOTE — Assessment & Plan Note (Signed)
Quit smoking as of 07/31/2019. Commended her on the success. Continue nicotine patches.

## 2019-08-11 NOTE — Assessment & Plan Note (Addendum)
Ischemic stroke to the right basal ganglia.  Also evidence of older strokes on MRI.  Hospitalized and treated, complications of cardiac arrest with successful resuscitation after 10 minutes.  She is compliant to all medications prescribed including Eliquis, aspirin, carvedilol, rosuvastatin, Zetia, Entresto, spironolactone.  It is unclear whether she needs to follow-up with neurology in the outpatient setting, she does have follow-up scheduled with cardiology tomorrow.  Commended her on smoking cessation, continue use of nicotine patches.  She will notify me if she needs assistance arranging home health PT/OT/SLP within network.  Hospital notes, labs, imaging reviewed.

## 2019-08-11 NOTE — Assessment & Plan Note (Signed)
Historically uncontrolled and has refused statin therapy and Zetia in the past.  Now with recent stroke and history of older strokes on MRI.  Compliant to rosuvastatin and Zetia as prescribed.  Continue same.  LDL goal less than 70.

## 2019-08-11 NOTE — Progress Notes (Signed)
Subjective:    Patient ID: [name], female    DOB: Nov 22, 1950, 69 y.o.   MRN: [id number]  HPI  This visit occurred during the SARS-CoV-2 public health emergency.  Safety protocols were in place, including screening questions prior to the visit, additional usage of staff PPE, and extensive cleaning of exam room while observing appropriate contact time as indicated for disinfecting solutions.   [name] is a 69 year old female with a history of hypertension, Arnold-Chiari malformation, tobacco abuse, CKD, uncontrolled hyperlipidemia, acute ischemic stroke, respiratory arrest who presents today for hospital follow up.  She presented to [facility] ED on 05/31/2020 with a chief complaint of left-sided facial numbness upon waking, also with difficulty swallowing Coke with breakfast and generalized weakness.  She also endorsed a 10-day history of seeing " flashing lights".  She underwent CT head which showed CVA of undetermined age, Chiari I malformation, chronic lacunar infarct within right caudate. MRI revealed acute ischemic infarct to the right basal ganglia.  She was admitted for further treatment.  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 Carotid dopplers with 1-39% stenosis.  Unfortunately she was noted to have gone to ventricular fibrillation with torsades de points early on 08/04/2019.  CPR performed for 10 minutes with successful resuscitation, defibrillator used, patient intubated and transferred to ICU.  Extubated later that morning, awake and alert.  Cardiology consulted who recommended cardiac catheterization which revealed stable 70-80% RCA lesion.[name] She was noted to have paroxysmal atrial fibrillation, CHA2DS2-VASc score of 6, Eliquis initiated.  She was noted to have absent pedal pulses, also faint femoral pulse noted by cardiology.  She was discharged home on 08/07/19  with recommendations for PCP and cardiology follow up with repeat labs. She was set up with home health PT/OT/SLP. She was initiated on aspirin, Eliquis, carvedilol, spironolactone, rosuvastatin, Zetia, and Entresto.  LifeVest placed at discharge.  Today she is doing okay, very sore to her chest wall secondary to chest compressions from CPR.  Chest x-ray 2 days after CPR without evident rib fractures.  She is compliant to her life vest which has added discomfort to her chest wall. She also endorses daily itching that started during her hospital stay but has continued in the outpatient setting. The itching is worse in the evening, located primarily to the back and face.  She denies changes in soaps, detergents, clothing, supplements.  She denies throat closure, shortness of breath. She was started on numerous new medications prior to discharge.  She has been contacted by home health PT/OT/SLP and they are not within her network.  She believes that someone is working on getting home health PT/OT/SLP within her network.  She is non compliant to her honey thickened diet, eating soft foods at home without choking. She is not smoking, last cigarette was on 07/31/19, using nicotine patches.   She does not check her blood pressure at home. She denies chest pain, numbness/tingling. She does have residual left upper and lower extremity weakness. She has an appointment with cardiology tomorrow.   She continues to struggle with daily anxiety which include symptoms of worry, dwelling, difficulty sleeping, "feeling wound up".  She has a chronic history of anxiety and has been treated with numerous medications in the past for which she could not tolerate.  These medications include Wellbutrin, BuSpar, Zoloft.  She was once on Xanax for which she took nightly.  She does not wish to take any medication for daily anxiety despite her daily symptoms.  BP Readings from Last 3 Encounters:  08/11/19 100/60  08/07/19 137/65    12/13/18 120/70     Review of Systems  Eyes: Negative for visual disturbance.  Respiratory: Negative for shortness of breath.   Cardiovascular: Negative for chest pain.  Musculoskeletal:       Chest wall discomfort  Skin:       Bruising to chest wall from CPR. Itching to posterior trunk and face.  Neurological: Positive for weakness. Negative for dizziness.  Psychiatric/Behavioral: Positive for sleep disturbance. The patient is nervous/anxious.        Past Medical History:  Diagnosis Date  . Allergy   . Anxiety   . Arnold-Chiari malformation ([place])   . Arthritis   . GERD (gastroesophageal reflux disease)   . H. pylori infection    3 years ago  . Hyperlipidemia   . Incontinence   . Paroxysmal atrial fibrillation ([place]) 08/06/2019  . Syncope and collapse    Per pt, denies passing out  . Tobacco use disorder   . Unspecified essential hypertension      Social History   Socioeconomic History  . Marital status: Married    Spouse name: Not on file  . Number of children: 2  . Years of education: Not on file  . Highest education level: Not on file  Occupational History  . Occupation: hair dresser  Tobacco Use  . Smoking status: Former Smoker    Packs/day: 1.00    Years: 50.00    Pack years: 50.00    Types: Cigarettes  . Smokeless tobacco: Never Used  Substance and Sexual Activity  . Alcohol use: No    Alcohol/week: 0.0 standard drinks  . Drug use: No  . Sexual activity: Not on file  Other Topics Concern  . Not on file  Social History Narrative  . Not on file   Social Determinants of Health   Financial Resource Strain:   . Difficulty of Paying Living Expenses: Not on file  Food Insecurity:   . Worried About Charity fundraiser in the Last Year: Not on file  . Ran Out of Food in the Last Year: Not on file  Transportation Needs:   . Lack of Transportation (Medical): Not on file  . Lack of Transportation (Non-Medical): Not on file  Physical Activity:   .  Days of Exercise per Week: Not on file  . Minutes of Exercise per Session: Not on file  Stress:   . Feeling of Stress : Not on file  Social Connections:   . Frequency of Communication with Friends and Family: Not on file  . Frequency of Social Gatherings with Friends and Family: Not on file  . Attends Religious Services: Not on file  . Active Member of Clubs or Organizations: Not on file  . Attends Archivist Meetings: Not on file  . Marital Status: Not on file  Intimate Partner Violence:   . Fear of Current or Ex-Partner: Not on file  . Emotionally Abused: Not on file  . Physically Abused: Not on file  . Sexually Abused: Not on file    Past Surgical History:  Procedure Laterality Date  . APPENDECTOMY    . ARNOLD CHIARI SURGERY     neurocranial surgery  . BLEPHAROPLASTY     Bil  . C-EYE SURGERY PROCEDURE    . CHOLECYSTECTOMY    . EYE SURGERY    .  LEFT HEART CATH AND CORONARY ANGIOGRAPHY N/A 08/04/2019   Procedure: LEFT HEART CATH AND CORONARY ANGIOGRAPHY;  Surgeon: Adrian Prows, MD;  Location: [facility];  Service: Cardiovascular;  Laterality: N/A;  . MASS EXCISION Right 12/27/2013   Procedure: MINOR EXCISION OF RIGHT THUMB MUCOID CYST, DEBRIDEMENT OF INTERPHALANGEAL JOINT;  Surgeon: Cammie Sickle, MD;  Location: [facility];  Service: Orthopedics;  Laterality: Right;  . mass on thumb  right  . TOTAL KNEE ARTHROPLASTY Right 02/17/2017  . TOTAL KNEE ARTHROPLASTY Right 02/17/2017   Procedure: TOTAL KNEE ARTHROPLASTY;  Surgeon: Melrose Nakayama, MD;  Location: [facility];  Service: Orthopedics;  Laterality: Right;  . TUBAL LIGATION      Family History  Problem Relation Age of Onset  . Bone cancer Mother   . Heart disease Mother   . Cancer Sister        metastatic; unknown primary  . Diverticulosis Sister   . Tuberculosis Father   . Diabetes Sister   . Hypertension Sister   . Colon cancer Neg Hx   . Esophageal cancer Neg Hx   . Pancreatic cancer  Neg Hx   . Stomach cancer Neg Hx   . Liver disease Neg Hx   . Kidney disease Neg Hx   . Rectal cancer Neg Hx     Allergies  Allergen Reactions  . Shellfish-Derived Products Anaphylaxis  . Ativan [Lorazepam] Other (See Comments)    Makes "skin crawl" and insomnia  . Buspar [Buspirone] Nausea Only    Sweating and dizzy  . Clarithromycin Swelling  . Codeine Nausea And Vomiting  . Doxycycline Other (See Comments)    Unknown  . Guaifenesin Other (See Comments)    Palpitations  . Oxycodone Nausea And Vomiting  . Prednisone Nausea And Vomiting and Other (See Comments)    Makes my heart race  . Statins Other (See Comments)    Muscle pain  . Sulfonamide Derivatives Nausea And Vomiting    Achiness  . Tetracycline Nausea Only  . Vicodin [Hydrocodone-Acetaminophen] Nausea And Vomiting  . Famotidine Rash  . Latex Rash  . Omeprazole Nausea Only    Cough, shortness of breath - patient doesn't remember  . Peanut-Containing Drug Products Itching and Rash  . Wellbutrin [Bupropion] Palpitations    Current Outpatient Medications on File Prior to Visit  Medication Sig Dispense Refill  . acetaminophen (TYLENOL) 500 MG tablet Take 1,000 mg by mouth 2 (two) times daily as needed (pain).    [name] apixaban (ELIQUIS) 5 MG TABS tablet Take 1 tablet (5 mg total) by mouth 2 (two) times daily. 60 tablet 0  . aspirin 81 MG chewable tablet Chew 1 tablet (81 mg total) by mouth daily. 30 tablet 0  . carvedilol (COREG) 6.25 MG tablet Take 1 tablet (6.25 mg total) by mouth 2 (two) times daily with a meal. 60 tablet 0  . docusate sodium (COLACE) 100 MG capsule Take 100 mg by mouth daily as needed for mild constipation.    [name] ezetimibe (ZETIA) 10 MG tablet Take 1 tablet (10 mg total) by mouth daily. 30 tablet 0  . loratadine (CLARITIN) 10 MG tablet Take 10 mg by mouth daily as needed for allergies.     . nicotine (NICODERM CQ - DOSED IN MG/24 HOURS) 21 mg/24hr patch Place 1 patch (21 mg total) onto the skin daily.  30 patch 0  . nicotine polacrilex (NICORETTE) 4 MG gum Take 1 each (4 mg total) by mouth as needed  for smoking cessation. 50 tablet 0  . pantoprazole (PROTONIX) 40 MG tablet Take 1 tablet (40 mg total) by mouth daily at 12 noon. 30 tablet 0  . potassium chloride (KLOR-CON) 10 MEQ tablet Take 1 tablet (10 mEq total) by mouth daily for 5 days. 5 tablet 0  . rosuvastatin (CRESTOR) 10 MG tablet Take 1 tablet (10 mg total) by mouth daily at 6 PM. 30 tablet 0  . sacubitril-valsartan (ENTRESTO) 49-51 MG Take 1 tablet by mouth 2 (two) times daily. 60 tablet 0  . spironolactone (ALDACTONE) 25 MG tablet Take 0.5 tablets (12.5 mg total) by mouth daily. 15 tablet 0   No current facility-administered medications on file prior to visit.    BP 100/60   Pulse 70   Temp (!) 96.6 F (35.9 C) (Temporal)   Ht 5\' 4"  (1.626 m)   Wt 162 lb 8 oz (73.7 kg)   SpO2 95%   BMI 27.89 kg/m    Objective:   Physical Exam  Constitutional: She appears well-nourished.  Cardiovascular: Normal rate and regular rhythm.  Respiratory: Effort normal and breath sounds normal.  Musculoskeletal:     Cervical back: Neck supple.  Skin: Skin is warm and dry.  Evidence of healing bruising to anterior chest wall. No obvious rash to posterior trunk or face.  Psychiatric: She has a normal mood and affect.           Assessment & Plan:

## 2019-08-11 NOTE — Assessment & Plan Note (Signed)
Continued which is largely secondary to anxiety. We had a long discussion today in regards to anxiety and insomnia treatment.  I recommended treatment with SSRI or SNRI her daily anxiety symptoms, she declines.  She will notify me when she is ready for treatment.

## 2019-08-11 NOTE — Patient Instructions (Signed)
Stop by the lab prior to leaving today. I will notify you of your results once received.   Please notify me if you need help regarding home health physical therapy, occupational therapy, speech therapy. We need to make sure you get connected.  Please notify me when you are willing to be treated for anxiety.   Continue using the nicotine patches.   Follow up with the cardiologist as scheduled.   It was a pleasure to see you today!

## 2019-08-11 NOTE — Assessment & Plan Note (Signed)
Well-controlled, almost too low on current regimen. She is meeting with cardiology tomorrow, repeat blood pressure check at that time.

## 2019-08-11 NOTE — Assessment & Plan Note (Signed)
Secondary to recent ischemic stroke. I discussed for her to notify me if she continues to run into problems regarding home health speech therapy.  She endorses that someone is working on getting home health PT/OT/SLP set up.

## 2019-08-11 NOTE — Assessment & Plan Note (Signed)
Rate and rhythm regular today. Continue Eliquis twice daily for recurrent stroke prevention.

## 2019-08-11 NOTE — Assessment & Plan Note (Signed)
Chronic anxiety and insomnia. We had a long discussion today regarding treatment for anxiety and insomnia.  I recommended SSRI or SNRI for which she declines.  She will notify when ready for treatment.  I will not prescribe benzos for daily use of anxiety.

## 2019-08-11 NOTE — Assessment & Plan Note (Signed)
Compliant to potassium chloride as prescribed.  Repeat BMP pending.

## 2019-08-11 NOTE — Assessment & Plan Note (Signed)
New diagnosis noted during hospital visit. Compliant to beta-blocker. Blood pressure controlled.

## 2019-08-12 ENCOUNTER — Encounter: Payer: Self-pay | Admitting: Cardiology

## 2019-08-12 ENCOUNTER — Ambulatory Visit: Payer: Medicare HMO | Admitting: Cardiology

## 2019-08-12 VITALS — BP 131/57 | HR 56 | Temp 98.3°F | Ht 64.0 in | Wt 159.4 lb

## 2019-08-12 DIAGNOSIS — I447 Left bundle-branch block, unspecified: Secondary | ICD-10-CM

## 2019-08-12 DIAGNOSIS — I5023 Acute on chronic systolic (congestive) heart failure: Secondary | ICD-10-CM

## 2019-08-12 DIAGNOSIS — Z87891 Personal history of nicotine dependence: Secondary | ICD-10-CM

## 2019-08-12 DIAGNOSIS — I429 Cardiomyopathy, unspecified: Secondary | ICD-10-CM

## 2019-08-12 DIAGNOSIS — I10 Essential (primary) hypertension: Secondary | ICD-10-CM

## 2019-08-12 DIAGNOSIS — I480 Paroxysmal atrial fibrillation: Secondary | ICD-10-CM

## 2019-08-12 DIAGNOSIS — Z8673 Personal history of transient ischemic attack (TIA), and cerebral infarction without residual deficits: Secondary | ICD-10-CM | POA: Diagnosis not present

## 2019-08-12 DIAGNOSIS — E782 Mixed hyperlipidemia: Secondary | ICD-10-CM | POA: Diagnosis not present

## 2019-08-12 DIAGNOSIS — Z7901 Long term (current) use of anticoagulants: Secondary | ICD-10-CM | POA: Diagnosis not present

## 2019-08-12 DIAGNOSIS — Z8674 Personal history of sudden cardiac arrest: Secondary | ICD-10-CM | POA: Diagnosis not present

## 2019-08-12 NOTE — Patient Instructions (Addendum)
Please remember to bring in your medication bottles in at the next visit.   New Medications that were added at today's visit:  None  Medications that were discontinued at today's visit: None  Office will call you to have the following tests scheduled:  Labs before the next visit.   Check daily weights. Call the office if you gain more than 2 pounds in 24hr or 5 pounds in a week.   Low salt diet.   Please get labs done two days before the next visit at the nearest [facility] follow up with your PCP as scheduled.

## 2019-08-12 NOTE — Progress Notes (Signed)
Chief Complaint  Patient presents with  . Congestive Heart Failure  . Atrial Fibrillation  . Hospitalization Follow-up    REQUESTING PHYSICIAN:  Pleas Koch, NP [place],   [id number]  HPI  [name] is a 69 y.o. female who presents to the office with a chief complaint of " hospital follow-up." Patient's past medical history and cardiac risk factors include: Hypertension, hyperlipidemia, tobacco use, history of Arnold-Chiari malformation, history of VT/VF, cardiac arrest, nonischemic cardiomyopathy, peripheral vascular disease, paroxysmal atrial fibrillation, left bundle branch block, history of CVA.  Patient is accompanied by her husband at today's office visit.  During her recent hospitalization patient was evaluated by my partner Dr. Adrian Prows and is here today post discharge transition of care.  Post hospital follow-up: Patient initially presented to the hospital due to left-sided facial numbness and tingling.  According to the MRI results of August 02, 2019 she was noted to have an acute ischemic infarct of the right basal ganglia, no hemorrhage or mass-effect.  She was admitted to the hospital for further evaluation and was also found to have left bundle branch block.  Echocardiogram revealed severe LV systolic dysfunction.  Patient had an episode of sustained VT/VF and patient became pulseless, underwent ACLS protocol and CPR and was defibrillated and intubated.  However she was also successfully extubated upon arrival to the intensive care unit.  Patient underwent a left heart catheterization on August 04, 2019 findings noted below and no interventions performed.  Due to recent V. fib arrest and severely reduced left ventricular systolic function patient was discharged home with a wearable cardiac defibrillator, LifeVest.  In addition patient was also diagnosed with paroxysmal atrial fibrillation during her recent hospitalization and was  started on oral anticoagulation prior to discharge.  Since discharge patient has been doing well.  Patient states that shortness of breath is improved.  She is unable to comment on orthopnea as she uses a wedge.  She denies paroxysmal nocturnal dyspnea or lower extremity swelling.  Patient does not check her weight on a daily basis and has been reeducated to do so and to keep a log.  If she gains more than 3 pounds in a day or 5 pounds in a week she is asked to call the office for further medication titration.  Patient states that she has been compliant with her medical therapy since discharge.  And she has also stopped smoking.  Patient states that she has not received therapy from the LifeVest that she is currently wearing.  Review of systems positive for: Shortness of breath (improving), mild tenderness which is localizable over the sternum most likely secondary to CPR (improving since discharge). Currently patient denies lightheadedness, dizziness, palpitations,  paroxysmal nocturnal dyspnea, lower extremity swelling, near syncope, syncopal events, hematochezia, hemoptysis, hematemesis, melanotic stools.  ALLERGIES: Allergies  Allergen Reactions  . Shellfish-Derived Products Anaphylaxis  . Ativan [Lorazepam] Other (See Comments)    Makes "skin crawl" and insomnia  . Buspar [Buspirone] Nausea Only    Sweating and dizzy  . Clarithromycin Swelling  . Codeine Nausea And Vomiting  . Doxycycline Other (See Comments)    Unknown  . Guaifenesin Other (See Comments)    Palpitations  . Oxycodone Nausea And Vomiting  . Prednisone Nausea And Vomiting and Other (See Comments)    Makes my heart race  . Statins Other (See Comments)    Muscle pain  . Sulfonamide Derivatives Nausea And Vomiting    Achiness  .  Tetracycline Nausea Only  . Vicodin [Hydrocodone-Acetaminophen] Nausea And Vomiting  . Famotidine Rash  . Latex Rash  . Omeprazole Nausea Only    Cough, shortness of breath - patient doesn't  remember  . Peanut-Containing Drug Products Itching and Rash  . Wellbutrin [Bupropion] Palpitations     MEDICATION LIST PRIOR TO VISIT: No current facility-administered medications on file prior to visit.   Current Outpatient Medications on File Prior to Visit  Medication Sig Dispense Refill  . acetaminophen (TYLENOL) 500 MG tablet Take 1,000 mg by mouth 2 (two) times daily as needed (pain).    [name] apixaban (ELIQUIS) 5 MG TABS tablet Take 1 tablet (5 mg total) by mouth 2 (two) times daily. 60 tablet 0  . aspirin 81 MG chewable tablet Chew 1 tablet (81 mg total) by mouth daily. 30 tablet 0  . carvedilol (COREG) 6.25 MG tablet Take 1 tablet (6.25 mg total) by mouth 2 (two) times daily with a meal. 60 tablet 0  . docusate sodium (COLACE) 100 MG capsule Take 100 mg by mouth daily as needed for mild constipation.    [name] ezetimibe (ZETIA) 10 MG tablet Take 1 tablet (10 mg total) by mouth daily. 30 tablet 0  . loratadine (CLARITIN) 10 MG tablet Take 10 mg by mouth daily as needed for allergies.     . nicotine (NICODERM CQ - DOSED IN MG/24 HOURS) 21 mg/24hr patch Place 1 patch (21 mg total) onto the skin daily. 30 patch 0  . nicotine polacrilex (NICORETTE) 4 MG gum Take 1 each (4 mg total) by mouth as needed for smoking cessation. 50 tablet 0  . pantoprazole (PROTONIX) 40 MG tablet Take 1 tablet (40 mg total) by mouth daily at 12 noon. 30 tablet 0  . potassium chloride (KLOR-CON) 10 MEQ tablet Take 1 tablet (10 mEq total) by mouth daily for 5 days. 5 tablet 0  . rosuvastatin (CRESTOR) 10 MG tablet Take 1 tablet (10 mg total) by mouth daily at 6 PM. 30 tablet 0  . sacubitril-valsartan (ENTRESTO) 49-51 MG Take 1 tablet by mouth 2 (two) times daily. 60 tablet 0  . spironolactone (ALDACTONE) 25 MG tablet Take 0.5 tablets (12.5 mg total) by mouth daily. 15 tablet 0    PAST MEDICAL HISTORY: Past Medical History:  Diagnosis Date  . Allergy   . Anxiety   . Arnold-Chiari malformation ([place])   . Arthritis    . CHF (congestive heart failure) ([place])   . Coronary artery disease   . GERD (gastroesophageal reflux disease)   . H. pylori infection    3 years ago  . H/O cardiac arrest    Hx of VT/Vfib arrest  . Hyperlipidemia   . Incontinence   . Paroxysmal atrial fibrillation ([place]) 08/06/2019  . Syncope and collapse    Per pt, denies passing out  . Tobacco use disorder   . Unspecified essential hypertension     PAST SURGICAL HISTORY: Past Surgical History:  Procedure Laterality Date  . APPENDECTOMY    . ARNOLD CHIARI SURGERY     neurocranial surgery  . BLEPHAROPLASTY     Bil  . C-EYE SURGERY PROCEDURE    . CARDIAC CATHETERIZATION    . CHOLECYSTECTOMY    . EYE SURGERY    . heart failure    . LEFT HEART CATH AND CORONARY ANGIOGRAPHY N/A 08/04/2019   Procedure: LEFT HEART CATH AND CORONARY ANGIOGRAPHY;  Surgeon: Adrian Prows, MD;  Location: [facility];  Service: Cardiovascular;  Laterality:  N/A;  . MASS EXCISION Right 12/27/2013   Procedure: MINOR EXCISION OF RIGHT THUMB MUCOID CYST, DEBRIDEMENT OF INTERPHALANGEAL JOINT;  Surgeon: Cammie Sickle, MD;  Location: [facility];  Service: Orthopedics;  Laterality: Right;  . mass on thumb  right  . TOTAL KNEE ARTHROPLASTY Right 02/17/2017  . TOTAL KNEE ARTHROPLASTY Right 02/17/2017   Procedure: TOTAL KNEE ARTHROPLASTY;  Surgeon: Melrose Nakayama, MD;  Location: [facility];  Service: Orthopedics;  Laterality: Right;  . TUBAL LIGATION      FAMILY HISTORY: The patient family history includes Bone cancer in her mother; Cancer in her sister; Diabetes in her sister; Diverticulosis in her sister; Heart disease in her mother; Hypertension in her sister; Tuberculosis in her father.   SOCIAL HISTORY:  The patient  reports that she quit smoking 6 days ago. Her smoking use included cigarettes. She has a 50.00 pack-year smoking history. She has never used smokeless tobacco. She reports that she does not drink alcohol or use drugs.  14  ORGAN REVIEW OF SYSTEMS: CONSTITUTIONAL: No fever or significant weight loss EYES: No recent significant visual change EARS, NOSE, MOUTH, THROAT: No recent significant change in hearing CARDIOVASCULAR: See discussion in subjective/HPI RESPIRATORY: See discussion in subjective/HPI GASTROINTESTINAL: No recent complaints of abdominal pain GENITOURINARY: No recent significant change in genitourinary status MUSCULOSKELETAL: No recent significant change in musculoskeletal status INTEGUMENTARY: No recent rash NEUROLOGIC: No recent significant change in motor function PSYCHIATRIC: No recent significant change in mood ENDOCRINOLOGIC: No recent significant change in endocrine status HEMATOLOGIC/LYMPHATIC: No recent significant unexpected bruising ALLERGIC/IMMUNOLOGIC: No recent unexplained allergic reaction  PHYSICAL EXAM: Vitals with BMI 08/12/2019 08/11/2019 08/07/2019  Height 5\' 4"  5\' 4"  -  Weight 159 lbs 6 oz 162 lbs 8 oz -  BMI 15.40 08.67 -  Systolic 619 509 326  Diastolic 57 60 65  Pulse 56 70 63   CONSTITUTIONAL: Well-developed and well-nourished. No acute distress.  SKIN: Skin is warm and dry. No rash noted. No cyanosis. No pallor. No jaundice HEAD: Normocephalic and atraumatic.  EYES: No scleral icterus MOUTH/THROAT: Moist oral membranes.  NECK: No JVD present. No thyromegaly noted.   LYMPHATIC: No visible cervical adenopathy.  CHEST Normal respiratory effort. No intercostal retractions.  Currently wearing a LifeVest LUNGS: Clear to auscultation bilaterally.  No stridor. No wheezes. No rales.  CARDIOVASCULAR: Regular, positive Z1-I4, soft holosystolic murmur heard at the apex radiating to axilla, no gallops or rubs appreciated ABDOMINAL: Nonobese, soft, nontender, nondistended, positive bowel sounds in all 4 quadrants no apparent ascites.  EXTREMITIES: No peripheral edema. 2+ right femoral pulse, 1+ left femoral pulse, none palpable bilateral popliteal, dorsalis pedis or posterior  tibial pulses.  Warm to touch bilaterally.  No discoloration or cyanosis present. HEMATOLOGIC: No significant bruising NEUROLOGIC: Oriented to person, place, and time. Nonfocal. Normal muscle tone.  PSYCHIATRIC: Normal mood and affect. Normal behavior. Cooperative  CARDIAC DATABASE: EKG: 08/12/2019: Normal sinus rhythm with ventricular rate of 61 bpm, left axis deviation, left bundle branch block, nonspecific T wave abnormalities.  Prior EKG dated 08/04/2019 shows a normal sinus rhythm, left axis deviation, left bundle branch block  Echocardiogram: 08/03/2019: LVEF 25-30%, severely reduced left ventricular systolic function, global hypokinesis, moderate concentric left ventricular hypertrophy, mild MR, 08/04/2019: LVEF 25-30%, severely reduced LV function, global hypokinesis, paradoxical septal motion secondary to LBBB.   Heart Catheterization: 08/04/19:  LV: Global hypokinesis, upper limit of normal size, EF 25 to 30%. Left main: Normal. LAD: Mild diffuse disease.  Proximal LAD has  a 30 to 40% stenosis, mid segment has a 20 to 30% stenosis, scattered disease noted in the LAD.  Brisk flow. Circumflex: Again scattered disease noted in the circumflex.  Mid segment has at most a 30 to 40% stenosis which appears to be eccentric and calcified. RCA: Dominant.  Mild diffuse disease again noted.  Mid segment after the origin of RV branch has a 70 to 80% stenosis.  Brisk flow is evident throughout the RCA. Impression: Findings consistent with nonischemic cardiomyopathy.  Although she has significant disease in the right coronary artery, this does not explain her presentation with global hypokinesis, neither does it explain VF arrest as the lesion does not appear to be unstable.   Carotid duplex: 08/03/2019: Right Carotid: Velocities in the right ICA are consistent with a 1-39% stenosis. Left Carotid: Velocities in the left ICA are consistent with a 1-39%  stenosis. Vertebrals: Bilateral vertebral  arteries demonstrate antegrade flow. Subclavians: Normal flow hemodynamics were seen in bilateral subclavian arteries.  LABORATORY DATA: CBC Latest Ref Rng & Units 08/14/2019 08/07/2019 08/06/2019  WBC 4.0 - 10.5 K/uL - 8.5 8.6  Hemoglobin 12.0 - 15.0 g/dL 12.9 13.0 12.7  Hematocrit 36.0 - 46.0 % 38.0 39.3 38.6  Platelets 150 - 400 K/uL - 199 188    CMP Latest Ref Rng & Units 08/14/2019 08/11/2019 08/07/2019  Glucose 70 - 99 mg/dL 107(H) 159(H) 123(H)  BUN 8 - 23 mg/dL 10 18 18   Creatinine 0.44 - 1.00 mg/dL 1.20(H) 1.32(H) 1.06(H)  Sodium 135 - 145 mmol/L 142 142 141  Potassium 3.5 - 5.1 mmol/L 3.5 3.8 3.2(L)  Chloride 98 - 111 mmol/L 107 108 105  CO2 19 - 32 mEq/L - 25 23  Calcium 8.4 - 10.5 mg/dL - 8.7 8.3(L)  Total Protein 6.5 - 8.1 g/dL - - 5.9(L)  Total Bilirubin 0.3 - 1.2 mg/dL - - 1.2  Alkaline Phos 38 - 126 U/L - - 67  AST 15 - 41 U/L - - 32  ALT 0 - 44 U/L - - 41    Lipid Panel     Component Value Date/Time   CHOL 232 (H) 08/03/2019 0249   TRIG 128 08/03/2019 0249   HDL 31 (L) 08/03/2019 0249   CHOLHDL 7.5 08/03/2019 0249   VLDL 26 08/03/2019 0249   LDLCALC 175 (H) 08/03/2019 0249   LDLDIRECT 193.0 09/02/2016 0839    Lab Results  Component Value Date   HGBA1C 5.7 (H) 08/03/2019   No components found for: NTPROBNP Lab Results  Component Value Date   TSH 0.66 09/16/2017   TSH 1.11 09/02/2016   TSH 0.55 03/26/2015    FINAL MEDICATION LIST END OF ENCOUNTER: No orders of the defined types were placed in this encounter.   There are no discontinued medications.  No current facility-administered medications for this visit.  Current Outpatient Medications:  .  acetaminophen (TYLENOL) 500 MG tablet, Take 1,000 mg by mouth 2 (two) times daily as needed (pain)., Disp: , Rfl:  .  apixaban (ELIQUIS) 5 MG TABS tablet, Take 1 tablet (5 mg total) by mouth 2 (two) times daily., Disp: 60 tablet, Rfl: 0 .  aspirin 81 MG chewable tablet, Chew 1 tablet (81 mg total) by mouth  daily., Disp: 30 tablet, Rfl: 0 .  carvedilol (COREG) 6.25 MG tablet, Take 1 tablet (6.25 mg total) by mouth 2 (two) times daily with a meal., Disp: 60 tablet, Rfl: 0 .  docusate sodium (COLACE) 100 MG capsule, Take 100 mg by mouth  daily as needed for mild constipation., Disp: , Rfl:  .  ezetimibe (ZETIA) 10 MG tablet, Take 1 tablet (10 mg total) by mouth daily., Disp: 30 tablet, Rfl: 0 .  loratadine (CLARITIN) 10 MG tablet, Take 10 mg by mouth daily as needed for allergies. , Disp: , Rfl:  .  nicotine (NICODERM CQ - DOSED IN MG/24 HOURS) 21 mg/24hr patch, Place 1 patch (21 mg total) onto the skin daily., Disp: 30 patch, Rfl: 0 .  nicotine polacrilex (NICORETTE) 4 MG gum, Take 1 each (4 mg total) by mouth as needed for smoking cessation., Disp: 50 tablet, Rfl: 0 .  pantoprazole (PROTONIX) 40 MG tablet, Take 1 tablet (40 mg total) by mouth daily at 12 noon., Disp: 30 tablet, Rfl: 0 .  potassium chloride (KLOR-CON) 10 MEQ tablet, Take 1 tablet (10 mEq total) by mouth daily for 5 days., Disp: 5 tablet, Rfl: 0 .  rosuvastatin (CRESTOR) 10 MG tablet, Take 1 tablet (10 mg total) by mouth daily at 6 PM., Disp: 30 tablet, Rfl: 0 .  sacubitril-valsartan (ENTRESTO) 49-51 MG, Take 1 tablet by mouth 2 (two) times daily., Disp: 60 tablet, Rfl: 0 .  spironolactone (ALDACTONE) 25 MG tablet, Take 0.5 tablets (12.5 mg total) by mouth daily., Disp: 15 tablet, Rfl: 0  Facility-Administered Medications Ordered in Other Visits:  .  sodium chloride flush (NS) 0.9 % injection 3 mL, 3 mL, Intravenous, Once, Carmin Muskrat, MD  IMPRESSION:    ICD-10-CM   1. Acute on chronic HFrEF (heart failure with reduced ejection fraction) (HCC)  I50.23 EKG [id number]    Basic Metabolic Panel (BMET)    Magnesium    Pro b natriuretic peptide (BNP)[facility])  2. Cardiomyopathy, unspecified type ([facility])  I42.9   3. Left bundle branch block  I44.7   4. Paroxysmal atrial fibrillation (HCC)  I48.0   5. Long term  current use of anticoagulant  Z79.01   6. Mixed hyperlipidemia  E78.2   7. Essential hypertension  I10   8. History of CVA (cerebrovascular accident)  Z86.73   9. Former smoker  Z87.891      RECOMMENDATIONS: JENICA COSTILOW is a 69 y.o. female who presents to the office with a chief complaint of " hospital follow-up." Patient's past medical history and cardiac risk factors include: Hypertension, hyperlipidemia, tobacco use, history of Arnold-Chiari malformation, history of VT/VF, cardiac arrest, nonischemic cardiomyopathy, peripheral vascular disease, paroxysmal atrial fibrillation, left bundle branch block, history of CVA.  Acute on chronic heart failure with reduced ejection fraction, stage C, NYHA class II/III: Improving  Continue Coreg, Aldactone, Entresto.  Most recent labs reviewed.  Patient's creatinine function is still not back to baseline and therefore will hold off on further uptitrating Entresto at today's office visit.  We will recheck blood work prior to the next office visit so that her heart failure medication can further be uptitrated as laboratories and hemodynamics allow.  Hospital records reviewed.  Strict I's and O's, and daily weights recommended.  Fluid restriction to less than 2 L/day, sodium restriction to less than 1.5 g/day.  Patient continues to wear LifeVest since discharge.  Per patient, no therapy delivered.  Cardiomyopathy: See above  Left bundle branch block: Continue to monitor.  Paroxysmal atrial fibrillation:  Currently normal sinus rhythm.  Rate control: Coreg.  Rhythm control: None.  Thromboembolic prophylaxis currently on Eliquis.  Patient does not endorse any evidence of bleeding.  CHA2DS2VASc score: 7. Annual stroke risk: 9.6% (congestive heart failure, hypertension,  age, history of stroke, medical history of peripheral vascular disease, and gender)  Long-term oral anticoagulation use:  Indication paroxysmal atrial fibrillation.   Patient did not endorse any evidence of bleeding  Mixed hyperlipidemia: Currently not at goal.  Currently on Crestor and Zetia.  We will continue to monitor.  Patient does not endorse any evidence of myalgias.  History of CVA: Educated on the importance of secondary prevention.  Will defer further management to primary and neurology.  Former smoker: Educated on the importance of continued smoking cessation.  Since discharge patient has stopped smoking for which she is congratulated at today's office visit.  Given the physical examination findings of decreased pulses as mentioned above I suspect underlying peripheral vascular disease.  However, currently patient denies any symptoms of claudication, there appears to be good perfusion overall to bilateral legs, they both are warm to touch, without any evidence of cyanosis.  I assume that this has been chronic with good networks of collaterals with distal reconstitution of vessels.  For now we will focus on newly diagnosed cardiomyopathy, management of congestive heart failure, and paroxysmal atrial fibrillation.  Once the acute cardiovascular concerns are addressed we will focus on further management of her peripheral vascular disease or sooner if change in clinical status.  This was explained to the patient and her husband at today's office visit and they are in agreement.  Orders Placed This Encounter  Procedures  . Basic Metabolic Panel (BMET)  . Magnesium  . Pro b natriuretic peptide (BNP)[facility])  . EKG 12-Lead   --Continue cardiac medications as reconciled in final medication list. --Return in about 2 weeks (around 08/26/2019) for Discussion of test results.. Or sooner if needed. --Continue follow-up with your primary care physician regarding the management of your other chronic comorbid conditions.  Total time spent with patient was 60 minutes and greater than 50% of that time was spent in counseling and  coordination care with the patient regarding complex decision making and discussion as state above.  This includes reviewing history and physical, radiological reports, heart catheterization reports, carotid duplex reports, echocardiogram, reviewing the progress notes during her hospitalization, assessment of the patient and to further develop plan of care going forward.  Patient's questions and concerns were addressed to her and her husband's satisfaction. She and her husband voices understanding of the instructions provided during this encounter.   This note was created using a voice recognition software as a result there may be grammatical errors inadvertently enclosed that do not reflect the nature of this encounter. Every attempt is made to correct such errors.  Rex Kras, DO, [facility]. [place] Office: [phone]

## 2019-08-14 ENCOUNTER — Emergency Department (HOSPITAL_COMMUNITY): Payer: Medicare HMO

## 2019-08-14 ENCOUNTER — Observation Stay (HOSPITAL_COMMUNITY)
Admission: EM | Admit: 2019-08-14 | Discharge: 2019-08-15 | Disposition: A | Discharge status: Home / Self Care | Payer: Medicare HMO | Attending: Internal Medicine | Admitting: Internal Medicine

## 2019-08-14 ENCOUNTER — Encounter (HOSPITAL_COMMUNITY): Payer: Self-pay

## 2019-08-14 DIAGNOSIS — Z20822 Contact with and (suspected) exposure to covid-19: Secondary | ICD-10-CM | POA: Diagnosis not present

## 2019-08-14 DIAGNOSIS — N183 Chronic kidney disease, stage 3 unspecified: Secondary | ICD-10-CM | POA: Diagnosis not present

## 2019-08-14 DIAGNOSIS — E785 Hyperlipidemia, unspecified: Secondary | ICD-10-CM | POA: Diagnosis present

## 2019-08-14 DIAGNOSIS — Z87891 Personal history of nicotine dependence: Secondary | ICD-10-CM | POA: Insufficient documentation

## 2019-08-14 DIAGNOSIS — I480 Paroxysmal atrial fibrillation: Secondary | ICD-10-CM | POA: Diagnosis not present

## 2019-08-14 DIAGNOSIS — Z7901 Long term (current) use of anticoagulants: Secondary | ICD-10-CM | POA: Insufficient documentation

## 2019-08-14 DIAGNOSIS — I10 Essential (primary) hypertension: Secondary | ICD-10-CM | POA: Diagnosis present

## 2019-08-14 DIAGNOSIS — I429 Cardiomyopathy, unspecified: Secondary | ICD-10-CM | POA: Insufficient documentation

## 2019-08-14 DIAGNOSIS — I509 Heart failure, unspecified: Secondary | ICD-10-CM | POA: Insufficient documentation

## 2019-08-14 DIAGNOSIS — Z9861 Coronary angioplasty status: Secondary | ICD-10-CM | POA: Insufficient documentation

## 2019-08-14 DIAGNOSIS — Z79899 Other long term (current) drug therapy: Secondary | ICD-10-CM | POA: Diagnosis not present

## 2019-08-14 DIAGNOSIS — Z7982 Long term (current) use of aspirin: Secondary | ICD-10-CM | POA: Insufficient documentation

## 2019-08-14 DIAGNOSIS — R479 Unspecified speech disturbances: Secondary | ICD-10-CM | POA: Insufficient documentation

## 2019-08-14 DIAGNOSIS — G459 Transient cerebral ischemic attack, unspecified: Secondary | ICD-10-CM | POA: Diagnosis not present

## 2019-08-14 DIAGNOSIS — I2510 Atherosclerotic heart disease of native coronary artery without angina pectoris: Secondary | ICD-10-CM | POA: Diagnosis not present

## 2019-08-14 DIAGNOSIS — I130 Hypertensive heart and chronic kidney disease with heart failure and stage 1 through stage 4 chronic kidney disease, or unspecified chronic kidney disease: Secondary | ICD-10-CM | POA: Insufficient documentation

## 2019-08-14 DIAGNOSIS — I639 Cerebral infarction, unspecified: Secondary | ICD-10-CM | POA: Diagnosis not present

## 2019-08-14 DIAGNOSIS — M6281 Muscle weakness (generalized): Secondary | ICD-10-CM | POA: Diagnosis present

## 2019-08-14 DIAGNOSIS — R4702 Dysphasia: Secondary | ICD-10-CM | POA: Diagnosis not present

## 2019-08-14 DIAGNOSIS — I428 Other cardiomyopathies: Secondary | ICD-10-CM | POA: Insufficient documentation

## 2019-08-14 DIAGNOSIS — R42 Dizziness and giddiness: Secondary | ICD-10-CM | POA: Diagnosis not present

## 2019-08-14 DIAGNOSIS — I447 Left bundle-branch block, unspecified: Secondary | ICD-10-CM | POA: Diagnosis not present

## 2019-08-14 DIAGNOSIS — R29810 Facial weakness: Secondary | ICD-10-CM | POA: Diagnosis not present

## 2019-08-14 DIAGNOSIS — Z8673 Personal history of transient ischemic attack (TIA), and cerebral infarction without residual deficits: Secondary | ICD-10-CM

## 2019-08-14 DIAGNOSIS — R0602 Shortness of breath: Secondary | ICD-10-CM | POA: Diagnosis not present

## 2019-08-14 LAB — URINALYSIS, ROUTINE W REFLEX MICROSCOPIC
Bilirubin Urine: NEGATIVE
Glucose, UA: NEGATIVE mg/dL
Hgb urine dipstick: NEGATIVE
Ketones, ur: NEGATIVE mg/dL
Leukocytes,Ua: NEGATIVE
Nitrite: NEGATIVE
Protein, ur: NEGATIVE mg/dL
Specific Gravity, Urine: 1.004 — ABNORMAL LOW (ref 1.005–1.030)
pH: 6 (ref 5.0–8.0)

## 2019-08-14 LAB — DIFFERENTIAL
Abs Immature Granulocytes: 0.02 10*3/uL (ref 0.00–0.07)
Basophils Absolute: 0.1 10*3/uL (ref 0.0–0.1)
Basophils Relative: 1 %
Eosinophils Absolute: 0.4 10*3/uL (ref 0.0–0.5)
Eosinophils Relative: 4 %
Immature Granulocytes: 0 %
Lymphocytes Relative: 24 %
Lymphs Abs: 2.2 10*3/uL (ref 0.7–4.0)
Monocytes Absolute: 0.7 10*3/uL (ref 0.1–1.0)
Monocytes Relative: 8 %
Neutro Abs: 5.7 10*3/uL (ref 1.7–7.7)
Neutrophils Relative %: 63 %

## 2019-08-14 LAB — CBC
HCT: 40.7 % (ref 36.0–46.0)
Hemoglobin: 13.4 g/dL (ref 12.0–15.0)
MCH: 29.2 pg (ref 26.0–34.0)
MCHC: 32.9 g/dL (ref 30.0–36.0)
MCV: 88.7 fL (ref 80.0–100.0)
Platelets: 298 10*3/uL (ref 150–400)
RBC: 4.59 MIL/uL (ref 3.87–5.11)
RDW: 12.9 % (ref 11.5–15.5)
WBC: 9 10*3/uL (ref 4.0–10.5)
nRBC: 0 % (ref 0.0–0.2)

## 2019-08-14 LAB — I-STAT CHEM 8, ED
BUN: 10 mg/dL (ref 8–23)
Calcium, Ion: 1.07 mmol/L — ABNORMAL LOW (ref 1.15–1.40)
Chloride: 107 mmol/L (ref 98–111)
Creatinine, Ser: 1.2 mg/dL — ABNORMAL HIGH (ref 0.44–1.00)
Glucose, Bld: 107 mg/dL — ABNORMAL HIGH (ref 70–99)
HCT: 38 % (ref 36.0–46.0)
Hemoglobin: 12.9 g/dL (ref 12.0–15.0)
Potassium: 3.5 mmol/L (ref 3.5–5.1)
Sodium: 142 mmol/L (ref 135–145)
TCO2: 23 mmol/L (ref 22–32)

## 2019-08-14 LAB — COMPREHENSIVE METABOLIC PANEL
ALT: 14 U/L (ref 0–44)
AST: 15 U/L (ref 15–41)
Albumin: 3.4 g/dL — ABNORMAL LOW (ref 3.5–5.0)
Alkaline Phosphatase: 104 U/L (ref 38–126)
Anion gap: 13 (ref 5–15)
BUN: 10 mg/dL (ref 8–23)
CO2: 22 mmol/L (ref 22–32)
Calcium: 8.6 mg/dL — ABNORMAL LOW (ref 8.9–10.3)
Chloride: 107 mmol/L (ref 98–111)
Creatinine, Ser: 1.22 mg/dL — ABNORMAL HIGH (ref 0.44–1.00)
GFR calc Af Amer: 52 mL/min — ABNORMAL LOW (ref >60)
GFR calc non Af Amer: 45 mL/min — ABNORMAL LOW (ref >60)
Glucose, Bld: 111 mg/dL — ABNORMAL HIGH (ref 70–99)
Potassium: 3.6 mmol/L (ref 3.5–5.1)
Sodium: 142 mmol/L (ref 135–145)
Total Bilirubin: 0.7 mg/dL (ref 0.3–1.2)
Total Protein: 6.5 g/dL (ref 6.5–8.1)

## 2019-08-14 LAB — PROTIME-INR
INR: 1.4 — ABNORMAL HIGH (ref 0.8–1.2)
Prothrombin Time: 17.2 seconds — ABNORMAL HIGH (ref 11.4–15.2)

## 2019-08-14 LAB — APTT: aPTT: 42 seconds — ABNORMAL HIGH (ref 24–36)

## 2019-08-14 LAB — CBG MONITORING, ED: Glucose-Capillary: 112 mg/dL — ABNORMAL HIGH (ref 70–99)

## 2019-08-14 MED ORDER — SODIUM CHLORIDE 0.9% FLUSH
3.0000 mL | Freq: Once | INTRAVENOUS | Status: DC
Start: 2019-08-14 — End: 2019-08-15

## 2019-08-14 NOTE — Consult Note (Addendum)
Referring Physician: Dr. Vanita Panda    Chief Complaint: Right sided weakness and trouble swallowing  HPI: [name] is an 69 y.o. female who presents to the [facility] ED as a Code Stroke. She recently was discharged from [facility] after management of a right basal ganglia acute ischemic infarction. She was diagnosed with atrial fibrillation and started on Eliquis. This evening she was stable at her new baseline of mild left sided weakness, watching television, when she suddenly felt "foggy headed" and had trouble swallowing, in addition to some difficulty with her speech, described as having "trouble getting the words out". She also had some right sided weakness - she had difficulty using the remote with her RUE.  EMS was called and on arrival they noted right sided weakness and speech deficit. Code Stroke was called in the field.   LSN: 1:30 PM tPA Given: No: On Eliquis. Recent stroke.   Past Medical History:  Diagnosis Date  . Allergy   . Anxiety   . Arnold-Chiari malformation ([place])   . Arthritis   . CHF (congestive heart failure) ([place])   . Coronary artery disease   . GERD (gastroesophageal reflux disease)   . H. pylori infection    3 years ago  . H/O cardiac arrest    Hx of VT/Vfib arrest  . Hyperlipidemia   . Incontinence   . Paroxysmal atrial fibrillation ([place]) 08/06/2019  . Syncope and collapse    Per pt, denies passing out  . Tobacco use disorder   . Unspecified essential hypertension     Past Surgical History:  Procedure Laterality Date  . APPENDECTOMY    . ARNOLD CHIARI SURGERY     neurocranial surgery  . BLEPHAROPLASTY     Bil  . C-EYE SURGERY PROCEDURE    . CARDIAC CATHETERIZATION    . CHOLECYSTECTOMY    . EYE SURGERY    . heart failure    . LEFT HEART CATH AND CORONARY ANGIOGRAPHY N/A 08/04/2019   Procedure: LEFT HEART CATH AND CORONARY ANGIOGRAPHY;  Surgeon: Adrian Prows, MD;  Location: [facility];  Service: Cardiovascular;  Laterality: N/A;  . MASS EXCISION Right  12/27/2013   Procedure: MINOR EXCISION OF RIGHT THUMB MUCOID CYST, DEBRIDEMENT OF INTERPHALANGEAL JOINT;  Surgeon: Cammie Sickle, MD;  Location: [facility];  Service: Orthopedics;  Laterality: Right;  . mass on thumb  right  . TOTAL KNEE ARTHROPLASTY Right 02/17/2017  . TOTAL KNEE ARTHROPLASTY Right 02/17/2017   Procedure: TOTAL KNEE ARTHROPLASTY;  Surgeon: Melrose Nakayama, MD;  Location: [place] [facility];  Service: Orthopedics;  Laterality: Right;  . TUBAL LIGATION      Family History  Problem Relation Age of Onset  . Bone cancer Mother   . Heart disease Mother   . Cancer Sister        metastatic; unknown primary  . Diverticulosis Sister   . Tuberculosis Father   . Diabetes Sister   . Hypertension Sister   . Colon cancer Neg Hx   . Esophageal cancer Neg Hx   . Pancreatic cancer Neg Hx   . Stomach cancer Neg Hx   . Liver disease Neg Hx   . Kidney disease Neg Hx   . Rectal cancer Neg Hx    Social History:  reports that she quit smoking 6 days ago. Her smoking use included cigarettes. She has a 50.00 pack-year smoking history. She has never used smokeless tobacco. She reports that she does not drink alcohol or use drugs.  Allergies:  Allergies  Allergen Reactions  . Shellfish-Derived Products Anaphylaxis  . Ativan [Lorazepam] Other (See Comments)    Makes "skin crawl" and insomnia  . Buspar [Buspirone] Nausea Only    Sweating and dizzy  . Clarithromycin Swelling  . Codeine Nausea And Vomiting  . Doxycycline Other (See Comments)    Unknown  . Guaifenesin Other (See Comments)    Palpitations  . Oxycodone Nausea And Vomiting  . Prednisone Nausea And Vomiting and Other (See Comments)    Makes my heart race  . Statins Other (See Comments)    Muscle pain  . Sulfonamide Derivatives Nausea And Vomiting    Achiness  . Tetracycline Nausea Only  . Vicodin [Hydrocodone-Acetaminophen] Nausea And Vomiting  . Famotidine Rash  . Latex Rash  . Omeprazole Nausea Only     Cough, shortness of breath - patient doesn't remember  . Peanut-Containing Drug Products Itching and Rash  . Wellbutrin [Bupropion] Palpitations    Medications:  No current facility-administered medications on file prior to encounter.   Current Outpatient Medications on File Prior to Encounter  Medication Sig Dispense Refill  . acetaminophen (TYLENOL) 500 MG tablet Take 1,000 mg by mouth 2 (two) times daily as needed (pain).    [name] apixaban (ELIQUIS) 5 MG TABS tablet Take 1 tablet (5 mg total) by mouth 2 (two) times daily. 60 tablet 0  . aspirin 81 MG chewable tablet Chew 1 tablet (81 mg total) by mouth daily. 30 tablet 0  . carvedilol (COREG) 6.25 MG tablet Take 1 tablet (6.25 mg total) by mouth 2 (two) times daily with a meal. 60 tablet 0  . docusate sodium (COLACE) 100 MG capsule Take 100 mg by mouth daily as needed for mild constipation.    [name] ezetimibe (ZETIA) 10 MG tablet Take 1 tablet (10 mg total) by mouth daily. 30 tablet 0  . loratadine (CLARITIN) 10 MG tablet Take 10 mg by mouth daily as needed for allergies.     . nicotine (NICODERM CQ - DOSED IN MG/24 HOURS) 21 mg/24hr patch Place 1 patch (21 mg total) onto the skin daily. 30 patch 0  . nicotine polacrilex (NICORETTE) 4 MG gum Take 1 each (4 mg total) by mouth as needed for smoking cessation. 50 tablet 0  . pantoprazole (PROTONIX) 40 MG tablet Take 1 tablet (40 mg total) by mouth daily at 12 noon. 30 tablet 0  . potassium chloride (KLOR-CON) 10 MEQ tablet Take 1 tablet (10 mEq total) by mouth daily for 5 days. 5 tablet 0  . rosuvastatin (CRESTOR) 10 MG tablet Take 1 tablet (10 mg total) by mouth daily at 6 PM. 30 tablet 0  . sacubitril-valsartan (ENTRESTO) 49-51 MG Take 1 tablet by mouth 2 (two) times daily. 60 tablet 0  . spironolactone (ALDACTONE) 25 MG tablet Take 0.5 tablets (12.5 mg total) by mouth daily. 15 tablet 0     ROS: As per HPI. Comprehensive ROS otherwise negative.   Physical Examination: Blood pressure (!)  149/75, pulse 64, temperature 98.3 F (36.8 C), temperature source Oral, resp. rate 18, SpO2 98 %.  HEENT: [place]/AT Lungs: Respirations unlabored Ext: No edema  Neurologic Examination: Mental Status: Alert, oriented, thought content appropriate.  Speech mildly dysarthric but fluent without evidence of aphasia.  Able to follow all commands without difficulty. Cranial Nerves: II:  Visual fields intact. PERRL.  III,IV, VI: No ptosis. EOMI V,VII: Subtle asymmetry of lower quadrants of face. Temp sensation equal bilaterally VIII: hearing intact to voice IX,X:  No hypophonia XI: Head is midline XII: midline tongue extension  Motor: RUE 5/5 proximally and distally RLE 5/5 proximally and distally LUE with 4/5 strength proximally and distally LLE 5/5 but with slight lag in movement Sensory: Temp and light touch intact throughout, bilaterally. No extinction to DSS.  Deep Tendon Reflexes:  Normoactive x 4.  Plantars: Right: downgoing   Left: downgoing Cerebellar: No ataxia with FNF bilaterally. Some slowing on the left.  Gait: Deferred  Results for orders placed or performed during the hospital encounter of 08/14/19 (from the past 48 hour(s))  CBG monitoring, ED     Status: Abnormal   Collection Time: 08/14/19 10:26 PM  Result Value Ref Range   Glucose-Capillary 112 (H) 70 - 99 mg/dL    Comment: Glucose reference range applies only to samples taken after fasting for at least 8 hours.  Protime-INR     Status: Abnormal   Collection Time: 08/14/19 10:27 PM  Result Value Ref Range   Prothrombin Time 17.2 (H) 11.4 - 15.2 seconds   INR 1.4 (H) 0.8 - 1.2    Comment: (NOTE) INR goal varies based on device and disease states. Performed at [facility], [place] [address], [place], [place] [address]   APTT     Status: Abnormal   Collection Time: 08/14/19 10:27 PM  Result Value Ref Range   aPTT 42 (H) 24 - 36 seconds    Comment:        IF BASELINE aPTT IS ELEVATED, SUGGEST PATIENT RISK  ASSESSMENT BE USED TO DETERMINE APPROPRIATE ANTICOAGULANT THERAPY. Performed at [facility], [place] [address], [place] [address]   CBC     Status: None   Collection Time: 08/14/19 10:27 PM  Result Value Ref Range   WBC 9.0 4.0 - 10.5 K/uL   RBC 4.59 3.87 - 5.11 MIL/uL   Hemoglobin 13.4 12.0 - 15.0 g/dL   HCT 40.7 36.0 - 46.0 %   MCV 88.7 80.0 - 100.0 fL   MCH 29.2 26.0 - 34.0 pg   MCHC 32.9 30.0 - 36.0 g/dL   RDW 12.9 11.5 - 15.5 %   Platelets 298 150 - 400 K/uL   nRBC 0.0 0.0 - 0.2 %    Comment: Performed at [facility], [place] [address], [place], [place] [address]  Differential     Status: None   Collection Time: 08/14/19 10:27 PM  Result Value Ref Range   Neutrophils Relative % 63 %   Neutro Abs 5.7 1.7 - 7.7 K/uL   Lymphocytes Relative 24 %   Lymphs Abs 2.2 0.7 - 4.0 K/uL   Monocytes Relative 8 %   Monocytes Absolute 0.7 0.1 - 1.0 K/uL   Eosinophils Relative 4 %   Eosinophils Absolute 0.4 0.0 - 0.5 K/uL   Basophils Relative 1 %   Basophils Absolute 0.1 0.0 - 0.1 K/uL   Immature Granulocytes 0 %   Abs Immature Granulocytes 0.02 0.00 - 0.07 K/uL    Comment: Performed at [facility] [address], [place], [place] [address]  Comprehensive metabolic panel     Status: Abnormal   Collection Time: 08/14/19 10:27 PM  Result Value Ref Range   Sodium 142 135 - 145 mmol/L   Potassium 3.6 3.5 - 5.1 mmol/L   Chloride 107 98 - 111 mmol/L   CO2 22 22 - 32 mmol/L   Glucose, Bld 111 (H) 70 - 99 mg/dL    Comment: Glucose reference  range applies only to samples taken after fasting for at least 8 hours.   BUN 10 8 - 23 mg/dL   Creatinine, Ser 1.22 (H) 0.44 - 1.00 mg/dL   Calcium 8.6 (L) 8.9 - 10.3 mg/dL   Total Protein 6.5 6.5 - 8.1 g/dL   Albumin 3.4 (L) 3.5 - 5.0 g/dL   AST 15 15 - 41 U/L   ALT 14 0 - 44 U/L   Alkaline Phosphatase 104 38 - 126 U/L   Total Bilirubin 0.7 0.3 - 1.2 mg/dL   GFR calc non Af Amer 45 (L) >60 mL/min   GFR calc Af Amer 52  (L) >60 mL/min   Anion gap 13 5 - 15    Comment: Performed at [facility] [address] [place], [place] [address]  I-stat chem 8, ED     Status: Abnormal   Collection Time: 08/14/19 10:34 PM  Result Value Ref Range   Sodium 142 135 - 145 mmol/L   Potassium 3.5 3.5 - 5.1 mmol/L   Chloride 107 98 - 111 mmol/L   BUN 10 8 - 23 mg/dL   Creatinine, Ser 1.20 (H) 0.44 - 1.00 mg/dL   Glucose, Bld 107 (H) 70 - 99 mg/dL    Comment: Glucose reference range applies only to samples taken after fasting for at least 8 hours.   Calcium, Ion 1.07 (L) 1.15 - 1.40 mmol/L   TCO2 23 22 - 32 mmol/L   Hemoglobin 12.9 12.0 - 15.0 g/dL   HCT 38.0 36.0 - 46.0 %   CT HEAD CODE STROKE WO CONTRAST  Result Date: 08/14/2019 CLINICAL DATA:  Code stroke. Right sided weakness. Speech disturbance. Acute stroke presentation. EXAM: CT HEAD WITHOUT CONTRAST TECHNIQUE: Contiguous axial images were obtained from the base of the skull through the vertex without intravenous contrast. COMPARISON:  Head CT 08/04/2019.  MRI 08/02/2019. FINDINGS: Brain: No acute finding affects the brainstem or cerebellum. Previous suboccipital craniectomy. Cerebral hemispheres show subacute infarction in the right lateral basal ganglia and radiating white matter tracts as seen previously. No new acute infarction. No mass lesion, hemorrhage, hydrocephalus or extra-axial collection. Vascular: There is atherosclerotic calcification of the major vessels at the base of the brain. Skull: Otherwise negative Sinuses/Orbits: Clear/normal Other: None ASPECTS ([place] Stroke Program Early CT Score), assuming left hemisphere insult - Ganglionic level infarction (caudate, lentiform nuclei, internal capsule, insula, M1-M3 cortex): 7 - Supraganglionic infarction (M4-M6 cortex): 3 Total score (0-10 with 10 being normal): 10 IMPRESSION: 1. Subacute infarction in the right lateral basal ganglia/radiating white matter tracts as seen previously. No evidence of  hemorrhagic transformation or extension. No acute or focal finding seen elsewhere. 2. ASPECTS is 10, assuming left hemispheric insult clinically. 3. These results were communicated to Dr. Cheral Marker at 10:40 pmon 2/28/2021by text page via the [facility] messaging system. Electronically Signed   By: Nelson Chimes M.D.   On: 08/14/2019 22:41    Assessment: 69 y.o. female presenting with acute onset of expressive dysphasia and right sided weakness, now resolved.  1.Exam reveals LUE weakness and dysarthria, consistent with her recent right basal ganglia stroke. No deficits referable to the left cerebral hemisphere, brainstem or cerebellum are noted.  2. CT head shows no acute abnormality. There is a subacute infarction in the right lateral basal ganglia/radiating white matter tracts as seen previously, with no evidence of hemorrhagic transformation or extension.  3. Stroke Risk Factors - Atrial fibrillation, recent stroke, CHF, CAD, HLD and HTN 4. Presentation is most  likely secondary to TIA, as her symptoms best localize to the left MCA territory.   Plan: 1. On maximal medical therapy for stroke prevention, with anticoagulation and statin. Also on BP meds and a low-dose statin (history of muscle pain on statin medication).  2. Consider obtaining an MRI to fully rule out stroke. Otherwise, has had a full recent stroke evaluation.  3. Permissive HTN x 24 hours.   Addendum: -- Failed swallow evaluation. Unable to take PO including Eliquis. -- She is being started on therapeutic Lovenox. Hospitalist is ordering with pharmacy to dose given low eGFR of 45  '@Electronically'$  signed: Dr. Kerney Elbe  08/14/2019, 11:33 PM

## 2019-08-14 NOTE — ED Provider Notes (Signed)
[name] EM[facility] DE[facility]MENT Provider Note   CSN: [id number] Arrival date & time: 08/14/19  2228  An emergency department physician performed an initial assessment on this suspected stroke patient at 2225.  History Chief Complaint  Patient presents with  . Code Stroke    Right side     [name] is a 69 y.o. female with a hx of osteoarthritis hypertension, hyperlipidemia, GERD, chronic kidney disease, CVA, A. Fib on Eliquis presents to the Emergency Department via EMS as code stroke with reported last known well at 1:30 (per EMS).  Patient reports she was watching TV around 9 PM when her right arm began to feel weak.  She reports she was having trouble working the remote and felt as if her arm was heavy.  She reports she also felt like her words were slowed was having trouble swallowing.  She stated she felt as if she was having a hard time getting her words out.  She reports the symptoms worried her thus prompting her 911 call.  She denies chest pain at the time but stated she had some mild shortness of breath that has resolved.  She denies recent fevers, chills, headache, neck pain, abdominal pain, nausea, vomiting, diarrhea.  Patient reports she has some residual weakness in her left arm from her stroke last week.  She reports it remains weaker than her right arm..  Patient denies sensory deficits or weakness in her legs.  Records reviewed.  Patient admitted on 08/02/2019 for new basal ganglia stroke.  During her hospitalization she had V. fib/torsades arrest on 08/04/2019.  Patient successfully resuscitated.  Patient noted to have a new left bundle branch block and echo revealed dilated cardiomyopathy with EF of 25-30% suspicion that stroke may have been embolic in nature.  Cardiac cath revealed 7080% RCA lesion.  Patient was started on Eliquis and carvedilol.    The history is provided by the patient, the EMS personnel and medical records. No language interpreter was  used.       Past Medical History:  Diagnosis Date  . Allergy   . Anxiety   . Arnold-Chiari malformation ([place])   . Arthritis   . CHF (congestive heart failure) ([place])   . Coronary artery disease   . GERD (gastroesophageal reflux disease)   . H. pylori infection    3 years ago  . H/O cardiac arrest    Hx of VT/Vfib arrest  . Hyperlipidemia   . Incontinence   . Paroxysmal atrial fibrillation ([place]) 08/06/2019  . Syncope and collapse    Per pt, denies passing out  . Tobacco use disorder   . Unspecified essential hypertension     Patient Active Problem List   Diagnosis Date Noted  . TIA (transient ischemic attack) 08/15/2019  . Long term current use of anticoagulant 08/14/2019  . Cardiomyopathy ([place]) 08/14/2019  . Paroxysmal atrial fibrillation ([place]) 08/06/2019  . Statin intolerance 08/03/2019  . Dysphagia   . History of CVA (cerebrovascular accident) 08/02/2019  . Hypokalemia 08/02/2019  . Left bundle branch block 08/02/2019  . CKD (chronic kidney disease) stage 3, GFR 30-59 ml/min 12/13/2018  . Primary localized osteoarthritis of right knee 02/17/2017  . Primary osteoarthritis of right knee 02/17/2017  . Preventative health care 09/01/2016  . Vitamin B12 deficiency 09/10/2015  . Insomnia 07/16/2015  . GERD (gastroesophageal reflux disease) 03/26/2015  . Fatigue 10/02/2014  . Other malaise and fatigue 02/08/2014  . HLD (hyperlipidemia) 11/19/2011  . Vaginal dryness, menopausal 11/19/2011  .  Adjustment disorder 01/08/2011  . Former smoker 12/19/2008  . Essential hypertension 12/19/2008    Past Surgical History:  Procedure Laterality Date  . APPENDECTOMY    . ARNOLD CHIARI SURGERY     neurocranial surgery  . BLEPHAROPLASTY     Bil  . C-EYE SURGERY PROCEDURE    . CARDIAC CATHETERIZATION    . CHOLECYSTECTOMY    . EYE SURGERY    . heart failure    . LEFT HEART CATH AND CORONARY ANGIOGRAPHY N/A 08/04/2019   Procedure: LEFT HEART CATH AND CORONARY ANGIOGRAPHY;   Surgeon: Adrian Prows, MD;  Location: [facility];  Service: Cardiovascular;  Laterality: N/A;  . MASS EXCISION Right 12/27/2013   Procedure: MINOR EXCISION OF RIGHT THUMB MUCOID CYST, DEBRIDEMENT OF INTERPHALANGEAL JOINT;  Surgeon: Cammie Sickle, MD;  Location: [facility];  Service: Orthopedics;  Laterality: Right;  . mass on thumb  right  . TOTAL KNEE ARTHROPLASTY Right 02/17/2017  . TOTAL KNEE ARTHROPLASTY Right 02/17/2017   Procedure: TOTAL KNEE ARTHROPLASTY;  Surgeon: Melrose Nakayama, MD;  Location: [facility];  Service: Orthopedics;  Laterality: Right;  . TUBAL LIGATION       OB History   No obstetric history on file.     Family History  Problem Relation Age of Onset  . Bone cancer Mother   . Heart disease Mother   . Cancer Sister        metastatic; unknown primary  . Diverticulosis Sister   . Tuberculosis Father   . Diabetes Sister   . Hypertension Sister   . Colon cancer Neg Hx   . Esophageal cancer Neg Hx   . Pancreatic cancer Neg Hx   . Stomach cancer Neg Hx   . Liver disease Neg Hx   . Kidney disease Neg Hx   . Rectal cancer Neg Hx     Social History   Tobacco Use  . Smoking status: Former Smoker    Packs/day: 1.00    Years: 50.00    Pack years: 50.00    Types: Cigarettes    Quit date: 08/08/2019    Years since quitting: 0.0  . Smokeless tobacco: Never Used  Substance Use Topics  . Alcohol use: No    Alcohol/week: 0.0 standard drinks  . Drug use: No    Home Medications Prior to Admission medications   Medication Sig Start Date End Date Taking? Authorizing Provider  acetaminophen (TYLENOL) 160 MG/5ML elixir Take 500 mg by mouth every 4 (four) hours as needed for fever.   Yes [provider]  acetaminophen (TYLENOL) 500 MG tablet Take 1,000 mg by mouth 2 (two) times daily as needed (pain).   Yes [provider]  apixaban (ELIQUIS) 5 MG TABS tablet Take 1 tablet (5 mg total) by mouth 2 (two) times daily. 08/07/19 09/06/19  Yes Alma Friendly, MD  aspirin 81 MG chewable tablet Chew 1 tablet (81 mg total) by mouth daily. 08/08/19  Yes Alma Friendly, MD  carvedilol (COREG) 6.25 MG tablet Take 1 tablet (6.25 mg total) by mouth 2 (two) times daily with a meal. 08/07/19 09/06/19 Yes Alma Friendly, MD  docusate sodium (COLACE) 100 MG capsule Take 100 mg by mouth daily as needed for mild constipation.   Yes [provider]  ezetimibe (ZETIA) 10 MG tablet Take 1 tablet (10 mg total) by mouth daily. 08/08/19 09/07/19 Yes Alma Friendly, MD  loratadine (CLARITIN) 10 MG tablet Take 10 mg by mouth  daily as needed for allergies.    Yes [provider]  nicotine (NICODERM CQ - DOSED IN MG/24 HOURS) 21 mg/24hr patch Place 1 patch (21 mg total) onto the skin daily. 08/07/19 09/06/19 Yes Alma Friendly, MD  nicotine polacrilex (NICORETTE) 4 MG gum Take 1 each (4 mg total) by mouth as needed for smoking cessation. 08/07/19 09/06/19 Yes Alma Friendly, MD  pantoprazole (PROTONIX) 40 MG tablet Take 1 tablet (40 mg total) by mouth daily at 12 noon. 08/07/19 09/06/19 Yes Alma Friendly, MD  rosuvastatin (CRESTOR) 10 MG tablet Take 1 tablet (10 mg total) by mouth daily at 6 PM. 08/07/19 09/06/19 Yes Alma Friendly, MD  sacubitril-valsartan (ENTRESTO) 49-51 MG Take 1 tablet by mouth 2 (two) times daily. 08/07/19 09/06/19 Yes Alma Friendly, MD  spironolactone (ALDACTONE) 25 MG tablet Take 0.5 tablets (12.5 mg total) by mouth daily. 08/08/19 09/07/19 Yes Alma Friendly, MD  potassium chloride (KLOR-CON) 10 MEQ tablet Take 1 tablet (10 mEq total) by mouth daily for 5 days. Patient not taking: Reported on 08/15/2019 08/07/19 08/12/19  Alma Friendly, MD    Allergies    Shellfish-derived products, Ativan [lorazepam], Buspar [buspirone], Clarithromycin, Codeine, Doxycycline, Guaifenesin, Oxycodone, Prednisone, Statins, Sulfonamide derivatives, Tetracycline, Vicodin  [hydrocodone-acetaminophen], Famotidine, Latex, Omeprazole, Peanut-containing drug products, and Wellbutrin [bupropion]  Review of Systems   Review of Systems  Constitutional: Negative for appetite change, diaphoresis, fatigue, fever and unexpected weight change.  HENT: Negative for mouth sores.   Eyes: Negative for visual disturbance.  Respiratory: Positive for shortness of breath (mild, resolved). Negative for cough, chest tightness and wheezing.   Cardiovascular: Negative for chest pain.  Gastrointestinal: Negative for abdominal pain, constipation, diarrhea, nausea and vomiting.  Endocrine: Negative for polydipsia, polyphagia and polyuria.  Genitourinary: Negative for dysuria, frequency, hematuria and urgency.  Musculoskeletal: Negative for back pain and neck stiffness.  Skin: Negative for rash.  Allergic/Immunologic: Negative for immunocompromised state.  Neurological: Positive for speech difficulty ( "slowed") and weakness ( right arm). Negative for syncope, light-headedness and headaches.  Hematological: Does not bruise/bleed easily.  Psychiatric/Behavioral: Negative for confusion and sleep disturbance. The patient is not nervous/anxious.     Physical Exam Updated Vital Signs BP (!) 149/75 (BP Location: Right Arm)   Pulse 64   Temp 98.3 F (36.8 C) (Oral)   Resp 18   SpO2 98%   Physical Exam Vitals and nursing note reviewed.  Constitutional:      General: She is not in acute distress.    Appearance: She is not diaphoretic.  HENT:     Head: Normocephalic.     Mouth/Throat:     Comments: No swelling of the oropharynx.  Eyes:     General: No scleral icterus.    Conjunctiva/sclera: Conjunctivae normal.     Pupils: Pupils are equal, round, and reactive to light.  Neck:     Comments: Handling secretions without difficulty.  Normal phonation.  No stridor. Cardiovascular:     Rate and Rhythm: Normal rate and regular rhythm.     Pulses: Normal pulses.          Radial  pulses are 2+ on the right side and 2+ on the left side.  Pulmonary:     Effort: No tachypnea, accessory muscle usage, prolonged expiration, respiratory distress or retractions.     Breath sounds: Normal breath sounds. No stridor. No wheezing.     Comments: Equal chest rise. No increased work of breathing. Abdominal:  General: There is no distension.     Palpations: Abdomen is soft.     Tenderness: There is no abdominal tenderness. There is no guarding or rebound.  Musculoskeletal:     Cervical back: Normal range of motion. No rigidity.     Comments: Moves all extremities equally and without difficulty.  Skin:    General: Skin is warm and dry.     Capillary Refill: Capillary refill takes less than 2 seconds.     Comments: Several excoriated places along the top of patient's abdomen, right side of her back and behind her right ear.  No vesicles, no signs of secondary infection.  Neurological:     Mental Status: She is alert.     GCS: GCS eye subscore is 4. GCS verbal subscore is 5. GCS motor subscore is 6.     Comments: Mental Status:  Alert, oriented, thought content appropriate, able to give a coherent history. Speech fluent without evidence of aphasia. Able to follow 2 step commands without difficulty.  Cranial Nerves:  II:  Peripheral visual fields grossly normal, pupils equal, round, reactive to light III,IV, VI: ptosis not present, extra-ocular motions intact bilaterally  V,VII: smile symmetric, facial light touch sensation equal VIII: hearing grossly normal to voice  X: uvula elevates symmetrically  XI: bilateral shoulder shrug symmetric and strong XII: midline tongue extension without fassiculations Motor:  Normal tone. 4/5 in left upper extremity - new baseline, 5/5 in the right upper extremity.  5/5 in the BLE.  Sensory: light touch normal in all extremities.  Cerebellar: normal finger-to-nose with bilateral upper extremities Gait: gait testing deferred CV: distal  pulses palpable throughout   Psychiatric:        Mood and Affect: Mood normal.     ED Results / Procedures / Treatments   Labs (all labs ordered are listed, but only abnormal results are displayed) Labs Reviewed  PROTIME-INR - Abnormal; Notable for the following components:      Result Value   Prothrombin Time 17.2 (*)    INR 1.4 (*)    All other components within normal limits  APTT - Abnormal; Notable for the following components:   aPTT 42 (*)    All other components within normal limits  COMPREHENSIVE METABOLIC PANEL - Abnormal; Notable for the following components:   Glucose, Bld 111 (*)    Creatinine, Ser 1.22 (*)    Calcium 8.6 (*)    Albumin 3.4 (*)    GFR calc non Af Amer 45 (*)    GFR calc Af Amer 52 (*)    All other components within normal limits  URINALYSIS, ROUTINE W REFLEX MICROSCOPIC - Abnormal; Notable for the following components:   Color, Urine STRAW (*)    Specific Gravity, Urine 1.004 (*)    All other components within normal limits  I-STAT CHEM 8, ED - Abnormal; Notable for the following components:   Creatinine, Ser 1.20 (*)    Glucose, Bld 107 (*)    Calcium, Ion 1.07 (*)    All other components within normal limits  CBG MONITORING, ED - Abnormal; Notable for the following components:   Glucose-Capillary 112 (*)    All other components within normal limits  TROPONIN I (HIGH SENSITIVITY) - Abnormal; Notable for the following components:   Troponin I (High Sensitivity) 21 (*)    All other components within normal limits  SARS CORONAVIRUS 2 (TAT 6-24 HRS)  CBC  DIFFERENTIAL    EKG EKG Interpretation  Date/Time:  Sunday August 14 2019 22:54:55 EST Ventricular Rate:  64 PR Interval:    QRS Duration: 169 QT Interval:  511 QTC Calculation: 528 R Axis:   -47 Text Interpretation: Sinus rhythm Atrial premature complex Left bundle branch block No significant change since last tracing Abnormal ECG Confirmed by Carmin Muskrat [phone]) on 08/14/2019  11:08:53 PM   Radiology CT HEAD CODE STROKE WO CONTRAST  Result Date: 08/14/2019 CLINICAL DATA:  Code stroke. Right sided weakness. Speech disturbance. Acute stroke presentation. EXAM: CT HEAD WITHOUT CONTRAST TECHNIQUE: Contiguous axial images were obtained from the base of the skull through the vertex without intravenous contrast. COMPARISON:  Head CT 08/04/2019.  MRI 08/02/2019. FINDINGS: Brain: No acute finding affects the brainstem or cerebellum. Previous suboccipital craniectomy. Cerebral hemispheres show subacute infarction in the right lateral basal ganglia and radiating white matter tracts as seen previously. No new acute infarction. No mass lesion, hemorrhage, hydrocephalus or extra-axial collection. Vascular: There is atherosclerotic calcification of the major vessels at the base of the brain. Skull: Otherwise negative Sinuses/Orbits: Clear/normal Other: None ASPECTS ([place] Stroke Program Early CT Score), assuming left hemisphere insult - Ganglionic level infarction (caudate, lentiform nuclei, internal capsule, insula, M1-M3 cortex): 7 - Supraganglionic infarction (M4-M6 cortex): 3 Total score (0-10 with 10 being normal): 10 IMPRESSION: 1. Subacute infarction in the right lateral basal ganglia/radiating white matter tracts as seen previously. No evidence of hemorrhagic transformation or extension. No acute or focal finding seen elsewhere. 2. ASPECTS is 10, assuming left hemispheric insult clinically. 3. These results were communicated to Dr. Cheral Marker at 10:40 pmon 2/28/2021by text page via the [facility] messaging system. Electronically Signed   By: Nelson Chimes M.D.   On: 08/14/2019 22:41    Procedures Procedures (including critical care time)  Medications Ordered in ED Medications  sodium chloride flush (NS) 0.9 % injection 3 mL (has no administration in time range)    ED Course  I have reviewed the triage vital signs and the nursing notes.  Pertinent labs & imaging results that were  available during my care of the patient were reviewed by me and considered in my medical decision making (see chart for details).  Clinical Course as of Aug 14 41  Sun Aug 14, 2019  2252 No hypoglycemia  Glucose-Capillary(!): 112 [HM]  2314 Discussed with Dr. Cheral Marker who has reviewed the CT scan and clinically evaluated the patient.  Pt's symptoms are nonspecific and CT is without ICH.  No LVO symptoms and pt is not a tPA candidate.  No recommendation for MRI or CTA at this time.  Recommendation for completion of medical eval.   [HM]  Mon Aug 15, 2019  0010 Updated by Dr. Cheral Marker, ED course looks more like TIA.  Pt to be admitted for permissive hypertension, MRI and stroke team eval in the AM   [HM]  0029 Minimally elevated, no new ECG changes  Troponin I (High Sensitivity)(!): 21 [HM]  0029 baseline  Creatinine(!): 1.22 [HM]    Clinical Course User Index [HM] Ronnell Clinger, Gwenlyn Perking     MDM Rules/Calculators/A&P                       Patient presents to the emergency department as code stroke.  Symptoms nonspecific including slowing of her speech, difficulty swallowing and right arm weakness.  On exam patient with baseline left arm weakness but no speech deficits noted.  Patient is alert and oriented.  No sensation deficits.  She reports compliance with her  anticoagulant.    CT scan shows previous basal ganglia stroke without extension or hemorrhage.  No new focal sites identified. Labs are reassuring.  Minimally elevated Troponin however patient without chest pain or EKG changes.  No persistent shortness of breath.  No cough or congestion.  Creatinine baseline.  No evidence of UTI.  Additional discussion with Dr. Cheral Marker, neurology.  They recommend mission for TIA work-up including MRI and permissive hypertension with stroke team Eval in the AM.  Discussed patient's case with hospitalist, Dr. Hal Hope.  I have recommended admission and patient (and family if present) agree with  this plan. Admitting physician will place admission orders.   The patient was discussed with and seen by Dr. Vanita Panda who agrees with the treatment plan.   Final Clinical Impression(s) / ED Diagnoses Final diagnoses:  TIA (transient ischemic attack)    Rx / DC Orders ED Discharge Orders    None       Arrie Zuercher, Gwenlyn Perking 08/15/19 0043    Carmin Muskrat, MD 08/15/19 1654

## 2019-08-15 ENCOUNTER — Observation Stay (HOSPITAL_COMMUNITY): Payer: Medicare HMO

## 2019-08-15 ENCOUNTER — Other Ambulatory Visit: Payer: Self-pay

## 2019-08-15 ENCOUNTER — Encounter (HOSPITAL_COMMUNITY): Payer: Self-pay | Admitting: Internal Medicine

## 2019-08-15 ENCOUNTER — Ambulatory Visit: Payer: Medicare HMO | Admitting: Family Medicine

## 2019-08-15 DIAGNOSIS — I10 Essential (primary) hypertension: Secondary | ICD-10-CM

## 2019-08-15 DIAGNOSIS — I480 Paroxysmal atrial fibrillation: Secondary | ICD-10-CM | POA: Diagnosis not present

## 2019-08-15 DIAGNOSIS — G459 Transient cerebral ischemic attack, unspecified: Secondary | ICD-10-CM

## 2019-08-15 DIAGNOSIS — I639 Cerebral infarction, unspecified: Secondary | ICD-10-CM | POA: Diagnosis not present

## 2019-08-15 LAB — CBC WITH DIFFERENTIAL/PLATELET
Abs Immature Granulocytes: 0.02 10*3/uL (ref 0.00–0.07)
Basophils Absolute: 0.1 10*3/uL (ref 0.0–0.1)
Basophils Relative: 1 %
Eosinophils Absolute: 0.3 10*3/uL (ref 0.0–0.5)
Eosinophils Relative: 4 %
HCT: 37.8 % (ref 36.0–46.0)
Hemoglobin: 12.4 g/dL (ref 12.0–15.0)
Immature Granulocytes: 0 %
Lymphocytes Relative: 21 %
Lymphs Abs: 1.6 10*3/uL (ref 0.7–4.0)
MCH: 29.4 pg (ref 26.0–34.0)
MCHC: 32.8 g/dL (ref 30.0–36.0)
MCV: 89.6 fL (ref 80.0–100.0)
Monocytes Absolute: 0.5 10*3/uL (ref 0.1–1.0)
Monocytes Relative: 7 %
Neutro Abs: 5.2 10*3/uL (ref 1.7–7.7)
Neutrophils Relative %: 67 %
Platelets: 273 10*3/uL (ref 150–400)
RBC: 4.22 MIL/uL (ref 3.87–5.11)
RDW: 13.1 % (ref 11.5–15.5)
WBC: 7.8 10*3/uL (ref 4.0–10.5)
nRBC: 0 % (ref 0.0–0.2)

## 2019-08-15 LAB — COMPREHENSIVE METABOLIC PANEL
ALT: 13 U/L (ref 0–44)
AST: 14 U/L — ABNORMAL LOW (ref 15–41)
Albumin: 3.1 g/dL — ABNORMAL LOW (ref 3.5–5.0)
Alkaline Phosphatase: 89 U/L (ref 38–126)
Anion gap: 10 (ref 5–15)
BUN: 8 mg/dL (ref 8–23)
CO2: 24 mmol/L (ref 22–32)
Calcium: 8.4 mg/dL — ABNORMAL LOW (ref 8.9–10.3)
Chloride: 109 mmol/L (ref 98–111)
Creatinine, Ser: 1.06 mg/dL — ABNORMAL HIGH (ref 0.44–1.00)
GFR calc Af Amer: >60 mL/min (ref >60)
GFR calc non Af Amer: 54 mL/min — ABNORMAL LOW (ref >60)
Glucose, Bld: 81 mg/dL (ref 70–99)
Potassium: 3.5 mmol/L (ref 3.5–5.1)
Sodium: 143 mmol/L (ref 135–145)
Total Bilirubin: 0.5 mg/dL (ref 0.3–1.2)
Total Protein: 5.9 g/dL — ABNORMAL LOW (ref 6.5–8.1)

## 2019-08-15 LAB — TROPONIN I (HIGH SENSITIVITY)
Troponin I (High Sensitivity): 21 ng/L — ABNORMAL HIGH (ref <18)
Troponin I (High Sensitivity): 24 ng/L — ABNORMAL HIGH (ref <18)

## 2019-08-15 LAB — SARS CORONAVIRUS 2 (TAT 6-24 HRS): SARS Coronavirus 2: NEGATIVE

## 2019-08-15 LAB — MAGNESIUM: Magnesium: 1.8 mg/dL (ref 1.7–2.4)

## 2019-08-15 MED ORDER — HYDRALAZINE HCL 20 MG/ML IJ SOLN: 10.0000 mg | INTRAMUSCULAR | Status: DC | PRN

## 2019-08-15 MED ORDER — ASPIRIN 325 MG PO TABS
325.0000 mg | ORAL_TABLET | Freq: Every day | ORAL | Status: DC
  Administered 2019-08-15: 325 mg via ORAL
  Filled 2019-08-15: qty 1

## 2019-08-15 MED ORDER — STROKE: EARLY STAGES OF RECOVERY BOOK
Freq: Once | Status: AC
  Filled 2019-08-15 (×2): qty 1

## 2019-08-15 MED ORDER — ACETAMINOPHEN 160 MG/5ML PO SOLN: 650.0000 mg | ORAL | Status: DC | PRN

## 2019-08-15 MED ORDER — ASPIRIN 81 MG PO CHEW: 81.0000 mg | CHEWABLE_TABLET | Freq: Every day | ORAL | Status: DC

## 2019-08-15 MED ORDER — ENOXAPARIN SODIUM 80 MG/0.8ML ~~LOC~~ SOLN
70.0000 mg | Freq: Two times a day (BID) | SUBCUTANEOUS | Status: DC
  Administered 2019-08-15: 70 mg via SUBCUTANEOUS
  Filled 2019-08-15: qty 0.8

## 2019-08-15 MED ORDER — CARVEDILOL 12.5 MG PO TABS
6.2500 mg | ORAL_TABLET | Freq: Two times a day (BID) | ORAL | Status: DC
  Administered 2019-08-15: 6.25 mg via ORAL
  Filled 2019-08-15: qty 1

## 2019-08-15 MED ORDER — ASPIRIN EC 81 MG PO TBEC
81.0000 mg | DELAYED_RELEASE_TABLET | Freq: Every day | ORAL | Status: DC
  Administered 2019-08-15: 81 mg via ORAL
  Filled 2019-08-15: qty 1

## 2019-08-15 MED ORDER — ACETAMINOPHEN 325 MG PO TABS: 650.0000 mg | ORAL_TABLET | ORAL | Status: DC | PRN

## 2019-08-15 MED ORDER — EZETIMIBE 10 MG PO TABS
10.0000 mg | ORAL_TABLET | Freq: Every day | ORAL | Status: DC
  Administered 2019-08-15: 10 mg via ORAL
  Filled 2019-08-15: qty 1

## 2019-08-15 MED ORDER — SACUBITRIL-VALSARTAN 49-51 MG PO TABS
1.0000 | ORAL_TABLET | Freq: Two times a day (BID) | ORAL | Status: DC
  Filled 2019-08-15: qty 1

## 2019-08-15 MED ORDER — SODIUM CHLORIDE 0.9 % IV SOLN: INTRAVENOUS | Status: DC

## 2019-08-15 MED ORDER — ROSUVASTATIN CALCIUM 5 MG PO TABS: 10.0000 mg | ORAL_TABLET | Freq: Every day | ORAL | Status: DC

## 2019-08-15 MED ORDER — DIPHENHYDRAMINE HCL 50 MG/ML IJ SOLN
25.0000 mg | Freq: Once | INTRAMUSCULAR | Status: AC
  Administered 2019-08-15: 25 mg via INTRAVENOUS
  Filled 2019-08-15: qty 1

## 2019-08-15 MED ORDER — APIXABAN 5 MG PO TABS
5.0000 mg | ORAL_TABLET | Freq: Two times a day (BID) | ORAL | Status: DC
  Administered 2019-08-15: 5 mg via ORAL
  Filled 2019-08-15: qty 1

## 2019-08-15 MED ORDER — PANTOPRAZOLE SODIUM 40 MG PO TBEC: 40.0000 mg | DELAYED_RELEASE_TABLET | Freq: Every day | ORAL | Status: DC

## 2019-08-15 MED ORDER — ACETAMINOPHEN 650 MG RE SUPP: 650.0000 mg | RECTAL | Status: DC | PRN

## 2019-08-15 MED ORDER — ASPIRIN 300 MG RE SUPP: 300.0000 mg | Freq: Every day | RECTAL | Status: DC

## 2019-08-15 NOTE — ED Notes (Addendum)
X1 unsuccessful attempt at report. RN not able to take it at this time.

## 2019-08-15 NOTE — Evaluation (Signed)
Speech Language Pathology Evaluation Patient Details Name: [name] MRN: [id number] DOB: March 13, 1951 Today's Date: 08/15/2019 Time: 8119-1478 SLP Time Calculation (min) (ACUTE ONLY): 18 min  Problem List:  Patient Active Problem List   Diagnosis Date Noted  . TIA (transient ischemic attack) 08/15/2019  . Long term current use of anticoagulant 08/14/2019  . Cardiomyopathy ([place]) 08/14/2019  . Paroxysmal atrial fibrillation ([place]) 08/06/2019  . Statin intolerance 08/03/2019  . Dysphagia   . History of CVA (cerebrovascular accident) 08/02/2019  . Hypokalemia 08/02/2019  . Left bundle branch block 08/02/2019  . CKD (chronic kidney disease) stage 3, GFR 30-59 ml/min 12/13/2018  . Primary localized osteoarthritis of right knee 02/17/2017  . Primary osteoarthritis of right knee 02/17/2017  . Preventative health care 09/01/2016  . Vitamin B12 deficiency 09/10/2015  . Insomnia 07/16/2015  . GERD (gastroesophageal reflux disease) 03/26/2015  . Fatigue 10/02/2014  . Other malaise and fatigue 02/08/2014  . HLD (hyperlipidemia) 11/19/2011  . Vaginal dryness, menopausal 11/19/2011  . Adjustment disorder 01/08/2011  . Former smoker 12/19/2008  . Essential hypertension 12/19/2008   Past Medical History:  Past Medical History:  Diagnosis Date  . Allergy   . Anxiety   . Arnold-Chiari malformation ([place])   . Arthritis   . CHF (congestive heart failure) ([place])   . Coronary artery disease   . GERD (gastroesophageal reflux disease)   . H. pylori infection    3 years ago  . H/O cardiac arrest    Hx of VT/Vfib arrest  . Hyperlipidemia   . Incontinence   . Paroxysmal atrial fibrillation ([place]) 08/06/2019  . Syncope and collapse    Per pt, denies passing out  . Tobacco use disorder   . Unspecified essential hypertension    Past Surgical History:  Past Surgical History:  Procedure Laterality Date  . APPENDECTOMY    . ARNOLD CHIARI SURGERY     neurocranial surgery  . BLEPHAROPLASTY      Bil  . C-EYE SURGERY PROCEDURE    . CARDIAC CATHETERIZATION    . CHOLECYSTECTOMY    . EYE SURGERY    . heart failure    . LEFT HEART CATH AND CORONARY ANGIOGRAPHY N/A 08/04/2019   Procedure: LEFT HEART CATH AND CORONARY ANGIOGRAPHY;  Surgeon: Adrian Prows, MD;  Location: [facility];  Service: Cardiovascular;  Laterality: N/A;  . MASS EXCISION Right 12/27/2013   Procedure: MINOR EXCISION OF RIGHT THUMB MUCOID CYST, DEBRIDEMENT OF INTERPHALANGEAL JOINT;  Surgeon: Cammie Sickle, MD;  Location: [facility];  Service: Orthopedics;  Laterality: Right;  . mass on thumb  right  . TOTAL KNEE ARTHROPLASTY Right 02/17/2017  . TOTAL KNEE ARTHROPLASTY Right 02/17/2017   Procedure: TOTAL KNEE ARTHROPLASTY;  Surgeon: Melrose Nakayama, MD;  Location: [facility];  Service: Orthopedics;  Laterality: Right;  . TUBAL LIGATION     HPI:  [name] is a 69 y.o. female with history of recent stroke with left-sided weakness and A. fib during that admission patient also had a cardiac arrest had several LifeVest started experiencing some sore throat-like feeling and difficulty with word finding.  Pt completed a MBS on 2/19 which revealed aspiration of thin and nectar-thick consistencies.  SLP recommended Dys 3 solids and honey-thick liquids at that time.  MRI on 08/15/19 reported: "Acute/subacute infarction changes again demonstrated within the right basal ganglia and radiating white matter tracts. Infarction changes along the posterior aspect of the infarction territory have slightly increased in extent from MRI  08/02/2019."    Assessment / Plan / Recommendation Clinical Impression  Pt was seen for a cognitive-linguistic evaluation and she presents with minimal short-term memory deficits.  Pt was see for an SLE on 08/03/19 and she exhibited minimal deficits in memory and higher-level executive functioning skills.  Suspect that deficits observed in today's evaluation are baseline from recent CVA.  No  additional cognitive deficits were observed and pt/husband reported that pt is at her baseline.  Expressive/receptive language was functional and no dysarthria was observed.  No further skilled ST is warranted regarding cognitive-linguistic abilities.  SLP will continue to follow up with pt for dysphagia tx.      SLP Assessment  SLP Recommendation/Assessment: Patient does not need any further Speech Lanaguage Pathology Services SLP Visit Diagnosis: Cognitive communication deficit (R41.841)    Follow Up Recommendations  Home health SLP    Frequency and Duration min 2x/week         SLP Evaluation Cognition  Overall Cognitive Status: History of cognitive impairments - at baseline Arousal/Alertness: Awake/alert Orientation Level: Oriented X4 Attention: Focused;Sustained Focused Attention: Appears intact Sustained Attention: Appears intact Memory: Impaired Memory Impairment: Decreased short term memory Decreased Short Term Memory: Verbal complex Awareness: Appears intact Problem Solving: Appears intact Safety/Judgment: Appears intact       Comprehension  Auditory Comprehension Overall Auditory Comprehension: Appears within functional limits for tasks assessed Yes/No Questions: Within Functional Limits Commands: Within Functional Limits Conversation: Complex Reading Comprehension Reading Status: Not tested    Expression Expression Primary Mode of Expression: Verbal Verbal Expression Overall Verbal Expression: Appears within functional limits for tasks assessed Initiation: No impairment Level of Generative/Spontaneous Verbalization: Conversation Repetition: No impairment Naming: No impairment Pragmatics: No impairment Written Expression Dominant Hand: Right Written Expression: Not tested   Oral / Motor  Oral Motor/Sensory Function Overall Oral Motor/Sensory Function: Within functional limits Motor Speech Overall Motor Speech: Appears within functional limits for tasks  assessed Phonation: Normal Resonance: Within functional limits Articulation: Within functional limitis Intelligibility: Intelligible Motor Planning: Witnin functional limits   GO                   Colin Mulders., M.S., CCC-SLP Acute Rehabilitation Services Office: [phone]  Elvia Collum [facility] 08/15/2019, 11:44 AM

## 2019-08-15 NOTE — Evaluation (Signed)
Clinical/Bedside Swallow Evaluation Patient Details  Name: [name] MRN: [id number] Date of Birth: [birth year]  Today's Date: 08/15/2019 Time: SLP Start Time (ACUTE ONLY): 3810 SLP Stop Time (ACUTE ONLY): 1049 SLP Time Calculation (min) (ACUTE ONLY): 14 min  Past Medical History:  Past Medical History:  Diagnosis Date  . Allergy   . Anxiety   . Arnold-Chiari malformation ([place])   . Arthritis   . CHF (congestive heart failure) ([place])   . Coronary artery disease   . GERD (gastroesophageal reflux disease)   . H. pylori infection    3 years ago  . H/O cardiac arrest    Hx of VT/Vfib arrest  . Hyperlipidemia   . Incontinence   . Paroxysmal atrial fibrillation ([place]) 08/06/2019  . Syncope and collapse    Per pt, denies passing out  . Tobacco use disorder   . Unspecified essential hypertension    Past Surgical History:  Past Surgical History:  Procedure Laterality Date  . APPENDECTOMY    . ARNOLD CHIARI SURGERY     neurocranial surgery  . BLEPHAROPLASTY     Bil  . C-EYE SURGERY PROCEDURE    . CARDIAC CATHETERIZATION    . CHOLECYSTECTOMY    . EYE SURGERY    . heart failure    . LEFT HEART CATH AND CORONARY ANGIOGRAPHY N/A 08/04/2019   Procedure: LEFT HEART CATH AND CORONARY ANGIOGRAPHY;  Surgeon: Adrian Prows, MD;  Location: [facility];  Service: Cardiovascular;  Laterality: N/A;  . MASS EXCISION Right 12/27/2013   Procedure: MINOR EXCISION OF RIGHT THUMB MUCOID CYST, DEBRIDEMENT OF INTERPHALANGEAL JOINT;  Surgeon: Cammie Sickle, MD;  Location: [facility];  Service: Orthopedics;  Laterality: Right;  . mass on thumb  right  . TOTAL KNEE ARTHROPLASTY Right 02/17/2017  . TOTAL KNEE ARTHROPLASTY Right 02/17/2017   Procedure: TOTAL KNEE ARTHROPLASTY;  Surgeon: Melrose Nakayama, MD;  Location: [facility];  Service: Orthopedics;  Laterality: Right;  . TUBAL LIGATION     HPI:  [name] is a 69 y.o. female with history of recent stroke with left-sided  weakness and A. fib during that admission patient also had a cardiac arrest had several LifeVest started experiencing some sore throat-like feeling and difficulty with word finding.  Pt completed a MBS on 2/19 which revealed aspiration of thin and nectar-thick consistencies.  SLP recommended Dys 3 solids and honey-thick liquids at that time.  MRI on 08/15/19 reported: "Acute/subacute infarction changes again demonstrated within the right basal ganglia and radiating white matter tracts. Infarction changes along the posterior aspect of the infarction territory have slightly increased in extent from MRI 08/02/2019."    Assessment / Plan / Recommendation Clinical Impression  Pt was seen for a bedside swallow evaluation.  She has a known hx of dysphagia with a recent MBS on 08/05/19 reporting intermittently sensed aspiration of thin liquid and nectar-thick liquid.  Pt discharged from that admission with recommendations for Dysphagia 3 solids and honey-thick liquids, but she reported that she has been consuming thin liquid at home since that time.  Pt and husband reported intermittent coughing/choking with thin liquids.  Pt and husband were re-educated regarding the results of the MBS via video playback.  Pt consumed trials of thin liquid, honey-thick liquid, puree, and soft solids.  She exhibited an immediate cough following 1/4 trials of thin liquid and a delayed cough following 2/4 trials of thin liquid.  A delayed cough was also observed when thin liquid was  used as a liquid wash for the soft solid trial.  Pt presented with prolonged mastication of soft solids and she stated that she has to take small bites/sips when consuming solids at home to avoid "strangling".  No overt s/sx of aspiration were observed with trials of honey-thick liquid or puree.  Recommend continuation of Dysphagia 3 (soft) solids and honey-thick liquids.  A repeat MBS would be beneficial to re-evaluate swallow function; however, pt stated that  she was hoping to discharge later today before the study can be completed.  Suspect that pt will not adhere to recommendations for honey-thick liquids at time of discharge and she was thoroughly educated regarding the increased risk of aspiration and aspiration PNA.  She verbalized understanding.  Discussed the possibility of pt being on the [facility] Protocol in order to more safely consume water during this admission and she was in agreement.  Therefore, pt may have small sips of water before a meal or 30+ minutes after a meal following thorough oral care.  Signs were hung in the pt's room regarding diet recommendations and the [employer] Protocol.  Spoke with RN regarding all recommendations.  Strongly recommend home health ST at time of discharge targeting dysphagia.    SLP Visit Diagnosis: Dysphagia, oropharyngeal phase (R13.12)    Aspiration Risk  Moderate aspiration risk    Diet Recommendation Dysphagia 3 (Mech soft);Honey-thick liquid   Liquid Administration via: Cup;Straw Medication Administration: Whole meds with puree Supervision: Intermittent supervision to cue for compensatory strategies Compensations: Slow rate;Small sips/bites;Clear throat intermittently Postural Changes: Seated upright at 90 degrees    Other  Recommendations Oral Care Recommendations: Oral care BID;Oral care prior to ice chip/H20 Other Recommendations: Order thickener from pharmacy   Follow up Recommendations Home health SLP      Frequency and Duration min 2x/week  2 weeks       Prognosis Prognosis for Safe Diet Advancement: Fair Barriers to Reach Goals: Other (Comment)(Willingness to follow recommendations)      Swallow Study   General Date of Onset: 08/14/19 HPI: [name] is a 69 y.o. female with history of recent stroke with left-sided weakness and A. fib during that admission patient also had a cardiac arrest had several LifeVest started experiencing some sore throat-like  feeling and difficulty with word finding.  Pt completed a MBS on 2/19 which revealed aspiration of thin and nectar-thick consistencies.  SLP recommended Dys 3 solids and honey-thick liquids at that time.  MRI on 08/15/19 reported: "Acute/subacute infarction changes again demonstrated within the right basal ganglia and radiating white matter tracts. Infarction changes along the posterior aspect of the infarction territory have slightly increased in extent from MRI 08/02/2019."  Type of Study: Bedside Swallow Evaluation Previous Swallow Assessment: See HPI  Diet Prior to this Study: Dysphagia 3 (soft);Honey-thick liquids Temperature Spikes Noted: Yes Respiratory Status: Room air History of Recent Intubation: No Behavior/Cognition: Alert;Cooperative;Pleasant mood Oral Cavity Assessment: Within Functional Limits Oral Care Completed by SLP: No Oral Cavity - Dentition: Adequate natural dentition Vision: Functional for self-feeding Self-Feeding Abilities: Able to feed self Patient Positioning: Upright in bed Baseline Vocal Quality: Normal Volitional Cough: Weak Volitional Swallow: Able to elicit    Oral/Motor/Sensory Function Overall Oral Motor/Sensory Function: Within functional limits   Ice Chips Ice chips: Not tested   Thin Liquid Thin Liquid: Impaired Presentation: Self Fed;Straw;Cup Pharyngeal  Phase Impairments: Multiple swallows;Cough - Immediate;Cough - Delayed    Nectar Thick Nectar Thick Liquid: Not tested   Honey  Thick Honey Thick Liquid: Within functional limits Presentation: Cup   Puree Puree: Within functional limits Presentation: Self Fed;Spoon   Solid     Solid: Impaired Presentation: Self Fed Oral Phase Impairments: Impaired mastication Oral Phase Functional Implications: Prolonged oral transit;Impaired mastication     Colin Mulders., M.S., CCC-SLP Acute Rehabilitation Services Office: [phone]  [place] 08/15/2019,11:28 AM

## 2019-08-15 NOTE — Progress Notes (Signed)
STROKE TEAM PROGRESS NOTE   INTERVAL HISTORY Patient is sitting up in bed comfortably.  Her husband is at the bedside.  She states she had a difficult time speaking yesterday as she felt her throat was dry.  She also felt right upper extremity was heavy and little weak.  This lasted less than an hour and improved after she came to the hospital.  She states that her left-sided hemiparesis from her recent stroke was unchanged.  MRI scan of the brain does not show any new infarct shows only mild evolutionary changes in her right subcortical infarct.  Vitals:   08/15/19 0456 08/15/19 0606 08/15/19 0759 08/15/19 0828  BP:  (!) 163/73 (!) 159/64 (!) 159/64  Pulse:  65 65 60  Resp:  19 16 19   Temp:  99 F (37.2 C) 98.7 F (37.1 C) 99.6 F (37.6 C)  TempSrc:   Oral Oral  SpO2:  93%  93%  Weight: 72.3 kg     Height: 5\' 4"  (1.626 m)       CBC:  Recent Labs  Lab 08/14/19 2227 08/14/19 2234  WBC 9.0  --   NEUTROABS 5.7  --   HGB 13.4 12.9  HCT 40.7 38.0  MCV 88.7  --   PLT 298  --     Basic Metabolic Panel:  Recent Labs  Lab 08/11/19 1138 08/11/19 1138 08/14/19 2227 08/14/19 2234  NA 142   < > 142 142  K 3.8   < > 3.6 3.5  CL 108   < > 107 107  CO2 25  --  22  --   GLUCOSE 159*   < > 111* 107*  BUN 18   < > 10 10  CREATININE 1.32*   < > 1.22* 1.20*  CALCIUM 8.7  --  8.6*  --    < > = values in this interval not displayed.   IMAGING past 48 hours MR BRAIN WO CONTRAST  Result Date: 08/15/2019 CLINICAL DATA:  Focal neuro deficit, greater than 6 hours, stroke suspected. Additional history provided CHIEF COMPLAINT: Right-sided weakness.  Right-sided weakness. EXAM: MRI HEAD WITHOUT CONTRAST TECHNIQUE: Multiplanar, multiecho pulse sequences of the brain and surrounding structures were obtained without intravenous contrast. COMPARISON:  Noncontrast head CT 08/14/2019, brain MRI/MRA 08/02/2019. FINDINGS: Brain: Again demonstrated is abnormal restricted diffusion within the right basal  ganglia and radiating white matter tracts consistent with acute/subacute infarction. Restricted diffusion is slightly increased in extent along the posterior aspect of the infarction territory as compared to brain MRI 08/02/2019. Corresponding T2/FLAIR hyperintensity at this site. No significant mass effect, effacement of the ventricular system or midline shift. No acute infarct is demonstrated elsewhere within the brain. Redemonstrated patchy T2/FLAIR hyperintensity within the cerebral white matter which is nonspecific, but consistent with chronic small vessel ischemic disease. Redemonstrated chronic lacunar infarcts within bilateral basal ganglia. No evidence of intracranial mass. No extra-axial fluid collection. No chronic intracranial blood products. Cerebral volume is normal for age. Redemonstrated Chiari I malformation with sequela of prior posterior fossa decompression. Vascular: Sequela prior posterior Skull and upper cervical spine: Sequela of prior Chiari decompression. No focal marrow lesion. Sinuses/Orbits: Visualized orbits demonstrate no acute abnormality. Minimal ethmoid sinus mucosal thickening left mastoid effusion. Trace fluid also present within right mastoid air cells. IMPRESSION: Acute/subacute infarction changes again demonstrated within the right basal ganglia and radiating white matter tracts. Infarction changes along the posterior aspect of the infarction territory have slightly increased in extent from MRI 08/02/2019. No new  acute infarct demonstrated elsewhere within the brain. Stable chronic small vessel ischemic disease with chronic bilateral basal ganglia lacunar infarcts. Redemonstrated Chiari I malformation with sequela of prior Chiari decompression. Left mastoid effusion. Electronically Signed   By: Kellie Simmering DO   On: 08/15/2019 08:09   CT HEAD CODE STROKE WO CONTRAST  Result Date: 08/14/2019 CLINICAL DATA:  Code stroke. Right sided weakness. Speech disturbance. Acute stroke  presentation. EXAM: CT HEAD WITHOUT CONTRAST TECHNIQUE: Contiguous axial images were obtained from the base of the skull through the vertex without intravenous contrast. COMPARISON:  Head CT 08/04/2019.  MRI 08/02/2019. FINDINGS: Brain: No acute finding affects the brainstem or cerebellum. Previous suboccipital craniectomy. Cerebral hemispheres show subacute infarction in the right lateral basal ganglia and radiating white matter tracts as seen previously. No new acute infarction. No mass lesion, hemorrhage, hydrocephalus or extra-axial collection. Vascular: There is atherosclerotic calcification of the major vessels at the base of the brain. Skull: Otherwise negative Sinuses/Orbits: Clear/normal Other: None ASPECTS ([place] Stroke Program Early CT Score), assuming left hemisphere insult - Ganglionic level infarction (caudate, lentiform nuclei, internal capsule, insula, M1-M3 cortex): 7 - Supraganglionic infarction (M4-M6 cortex): 3 Total score (0-10 with 10 being normal): 10 IMPRESSION: 1. Subacute infarction in the right lateral basal ganglia/radiating white matter tracts as seen previously. No evidence of hemorrhagic transformation or extension. No acute or focal finding seen elsewhere. 2. ASPECTS is 10, assuming left hemispheric insult clinically. 3. These results were communicated to Dr. Cheral Marker at 10:40 pmon 2/28/2021by text page via the [facility] messaging system. Electronically Signed   By: Nelson Chimes M.D.   On: 08/14/2019 22:41    PHYSICAL EXAM Pleasant middle-age Caucasian lady not in distress. . Afebrile. Head is nontraumatic. Neck is supple without bruit.    Cardiac exam no murmur or gallop. Lungs are clear to auscultation. Distal pulses are well felt. Neurological Exam :  She is awake alert oriented to time place and person.  Speech appears clear without dysarthria or aphasia.  Mildly diminished attention registration and recall.  Extraocular movements are full range without nystagmus.  Face is  symmetric with only subtle left lower facial weakness when she smiles.  Tongue midline.  Motor system exam shows mild left hemiparesis 4+/5 strength with left grip and intrinsic hand muscle weakness and orbits right over left upper extremity.  Mild weakness of left hip flexor and ankle dorsiflexors only.  Sensation is intact bilaterally.  Coordination is slow but accurate.  Gait not tested.  ASSESSMENT/PLAN Ms. [name] is a 69 y.o. female with history of PAF, HTN, HLD, CAD w/ VFib arrest, CHF, Chiari malformation presenting with transient  expressive dysphasia and right sided weakness.   Possible left brain TIA  Code Stroke CT head subacute R basal ganglia infarct. ASPECTS 10.     MRI  Unchanged R basal ganglia and radiating white matter infarcts with evolvement since 08/02/19. No new infarct. Stable small vessel disease. And old B basal ganglia lacunes. Chiari I w/ prior decompression.  LDL 175  HgbA1c 5.7  Eliquis for VTE prophylaxis  aspirin 81 mg daily and Eliquis (apixaban) daily prior to admission, now on aspirin 325 mg daily and full dose lovenox. Recommend continuation of aspirin 81 and Eliquis and continue at d/c. No need for full dose lovenox from stroke perpsective  Therapy recommendations:  HH SLP, OP PT (SLP recommended at prior d/c, no PT or OT needs at that time)   Disposition:  Return home  No indication for  further stroke workup  Follow up stroke clinic. Order placed.   Dysphagia secondary to stroke  On D3 nectar thick liquids  SLP followup recommended at prior d/c and again w/ this d/c  Atrial Fibrillation  Home anticoagulation:  Eliquis (apixaban) daily   Now on full dose lovenox . Continue aspirin 81 mg daily and Eliquis (apixaban) daily at discharge   Hypertension  Stable . BP goal normotensive  Hyperlipidemia  Home meds:  zetia 10 and crestor 10, resumed in hospital  LDL 175, goal < 70  Continue statin at  discharge  Dysphagia . Secondary to stroke . NPO . Speech on board   Other Stroke Risk Factors  Advanced age  Cigarette smoker, quit smoking 6 days ago. 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  She presented with slight speech difficulties and right hand heaviness unclear if this presents with 2 TIA or only subjective symptoms.  Continue Eliquis for stroke prevention and aggressive risk factor modification.  Patient can be discharged home.  Follow-up as an outpatient stroke clinic.  Discussed with Dr. Eliseo Squires.  Greater than 50% time during this 25-minute visit was spent on counseling and coordination of care and answering questions  Antony Contras, MD Medical Director [place] Pager: [phone] 08/15/2019 4:00 PM   To contact Stroke Continuity provider, please refer to [email or web address] After hours, contact General Neurology

## 2019-08-15 NOTE — Progress Notes (Signed)
MS [name] Admitted from [facility] ED  From home , transported by EMS with Code strok. Pt had previous RT basal Ganglia stroke and was d/c home on 08/07/19. PT c/o rt arm and leg weakness and dysarthria with dysphagia   Symptoms have resolved with slight weakness of left side residual from previous stroke. Hx of Afib and on Eliquis.  Pt alert and oriented x 4 and   Moves all extremities.   Oriented to room cardiac monitor in use. Plan of care discussed with pt and  Pt demonstrates understanding. By verbalizing.   Call bell within pt each.  Pt NPO as pt failed [employer] screen.  SLP to evaluate.  RN will continue to monitor pt.

## 2019-08-15 NOTE — Progress Notes (Signed)
ANTICOAGULATION CONSULT NOTE - Initial Consult  Pharmacy Consult for Lovenox until patient passes swallow evaluation and re-starts Apixaban  Indication: atrial fibrillation and secondary stroke prevention   Allergies  Allergen Reactions  . Shellfish-Derived Products Anaphylaxis  . Ativan [Lorazepam] Other (See Comments)    Makes "skin crawl" and insomnia  . Buspar [Buspirone] Nausea Only    Sweating and dizzy  . Clarithromycin Swelling  . Codeine Nausea And Vomiting  . Doxycycline Other (See Comments)    Unknown  . Guaifenesin Other (See Comments)    Palpitations  . Oxycodone Nausea And Vomiting  . Prednisone Nausea And Vomiting and Other (See Comments)    Makes my heart race  . Statins Other (See Comments)    Muscle pain  . Sulfonamide Derivatives Nausea And Vomiting    Achiness  . Tetracycline Nausea Only  . Vicodin [Hydrocodone-Acetaminophen] Nausea And Vomiting  . Famotidine Rash  . Latex Rash  . Omeprazole Nausea Only    Cough, shortness of breath - patient doesn't remember  . Peanut-Containing Drug Products Itching and Rash  . Wellbutrin [Bupropion] Palpitations    Vital Signs: Temp: 98.3 F (36.8 C) (02/28 2253) Temp Source: Oral (02/28 2253) BP: 136/73 (03/01 0315) Pulse Rate: 39 (03/01 0245)  Labs: Recent Labs    08/14/19 2227 08/14/19 2234 08/15/19 0100  HGB 13.4 12.9  --   HCT 40.7 38.0  --   PLT 298  --   --   APTT 42*  --   --   LABPROT 17.2*  --   --   INR 1.4*  --   --   CREATININE 1.22* 1.20*  --   TROPONINIHS 21*  --  24*    Estimated Creatinine Clearance: 43.1 mL/min (A) (by C-G formula based on SCr of 1.2 mg/dL (H)).   Medical History: Past Medical History:  Diagnosis Date  . Allergy   . Anxiety   . Arnold-Chiari malformation ([place])   . Arthritis   . CHF (congestive heart failure) ([place])   . Coronary artery disease   . GERD (gastroesophageal reflux disease)   . H. pylori infection    3 years ago  . H/O cardiac arrest    Hx of  VT/Vfib arrest  . Hyperlipidemia   . Incontinence   . Paroxysmal atrial fibrillation ([place]) 08/06/2019  . Syncope and collapse    Per pt, denies passing out  . Tobacco use disorder   . Unspecified essential hypertension      Assessment: 69 y/o F with history of afib and stroke. CT with no new acute findings. Failed swallow eval. To start Lovenox per neurology recommendation until pt passes swallow eval and can re-start her apxiaban.   Goal of Therapy:  Monitor platelets by anticoagulation protocol: Yes   Plan:  Lovenox 70 mg subcutaneous q12h Daily CBC while inpatient Monitor for bleeding F/U ability to pass swallow eval and re-start Apixaban   Narda Bonds, PharmD, BCPS Clinical Pharmacist Phone: [phone]

## 2019-08-15 NOTE — H&P (Signed)
History and Physical    [name] FMB:[id number] DOB: 16-Sep-1950 DOA: 08/14/2019  PCP: Pleas Koch, NP  Patient coming from: Home.  Chief Complaint: Right-sided weakness.  HPI: [name] is a 69 y.o. female with history of recent stroke with left-sided weakness and A. fib during that admission patient also had a cardiac arrest had several LifeVest started experiencing some sore throat-like feeling and difficulty to bring out words with difficulty to control the remote of her TV felt weak on the right upper extremity all of them which lasted for few minutes and resolved.  Patient has known history of left-sided weakness from the previous stroke which as per the patient has actually improved.  Patient was brought into the ER as a code stroke.  Patient in addition states that since discharge on February 21 patient has been having some rash and itching mainly on the trunk and neck area with no swelling of the tongue or lips or any difficulty breathing.  ED Course: In the ER patient appeared nonfocal CT head was showing old stroke.  EKG shows normal sinus rhythm and LBBB.  Neurology on-call was consulted at this time recommended observation for possible new TIA versus stroke and get MRI brain.  Allow for permissive hypertension.  Labs show creatinine 1.2 GFR 45 CBC unremarkable.  INR 1.4 patient failed swallow evaluation.  Covid test was negative.  Review of Systems: As per HPI, rest all negative.   Past Medical History:  Diagnosis Date  . Allergy   . Anxiety   . Arnold-Chiari malformation ([place])   . Arthritis   . CHF (congestive heart failure) ([place])   . Coronary artery disease   . GERD (gastroesophageal reflux disease)   . H. pylori infection    3 years ago  . H/O cardiac arrest    Hx of VT/Vfib arrest  . Hyperlipidemia   . Incontinence   . Paroxysmal atrial fibrillation ([place]) 08/06/2019  . Syncope and collapse    Per pt, denies passing out  . Tobacco use disorder   .  Unspecified essential hypertension     Past Surgical History:  Procedure Laterality Date  . APPENDECTOMY    . ARNOLD CHIARI SURGERY     neurocranial surgery  . BLEPHAROPLASTY     Bil  . C-EYE SURGERY PROCEDURE    . CARDIAC CATHETERIZATION    . CHOLECYSTECTOMY    . EYE SURGERY    . heart failure    . LEFT HEART CATH AND CORONARY ANGIOGRAPHY N/A 08/04/2019   Procedure: LEFT HEART CATH AND CORONARY ANGIOGRAPHY;  Surgeon: Adrian Prows, MD;  Location: [facility];  Service: Cardiovascular;  Laterality: N/A;  . MASS EXCISION Right 12/27/2013   Procedure: MINOR EXCISION OF RIGHT THUMB MUCOID CYST, DEBRIDEMENT OF INTERPHALANGEAL JOINT;  Surgeon: Cammie Sickle, MD;  Location: [facility];  Service: Orthopedics;  Laterality: Right;  . mass on thumb  right  . TOTAL KNEE ARTHROPLASTY Right 02/17/2017  . TOTAL KNEE ARTHROPLASTY Right 02/17/2017   Procedure: TOTAL KNEE ARTHROPLASTY;  Surgeon: Melrose Nakayama, MD;  Location: [facility];  Service: Orthopedics;  Laterality: Right;  . TUBAL LIGATION       reports that she quit smoking 7 days ago. Her smoking use included cigarettes. She has a 50.00 pack-year smoking history. She has never used smokeless tobacco. She reports that she does not drink alcohol or use drugs.  Allergies  Allergen Reactions  . Shellfish-Derived Products Anaphylaxis  .  Ativan [Lorazepam] Other (See Comments)    Makes "skin crawl" and insomnia  . Buspar [Buspirone] Nausea Only    Sweating and dizzy  . Clarithromycin Swelling  . Codeine Nausea And Vomiting  . Doxycycline Other (See Comments)    Unknown  . Guaifenesin Other (See Comments)    Palpitations  . Oxycodone Nausea And Vomiting  . Prednisone Nausea And Vomiting and Other (See Comments)    Makes my heart race  . Statins Other (See Comments)    Muscle pain  . Sulfonamide Derivatives Nausea And Vomiting    Achiness  . Tetracycline Nausea Only  . Vicodin [Hydrocodone-Acetaminophen] Nausea And  Vomiting  . Famotidine Rash  . Latex Rash  . Omeprazole Nausea Only    Cough, shortness of breath - patient doesn't remember  . Peanut-Containing Drug Products Itching and Rash  . Wellbutrin [Bupropion] Palpitations    Family History  Problem Relation Age of Onset  . Bone cancer Mother   . Heart disease Mother   . Cancer Sister        metastatic; unknown primary  . Diverticulosis Sister   . Tuberculosis Father   . Diabetes Sister   . Hypertension Sister   . Colon cancer Neg Hx   . Esophageal cancer Neg Hx   . Pancreatic cancer Neg Hx   . Stomach cancer Neg Hx   . Liver disease Neg Hx   . Kidney disease Neg Hx   . Rectal cancer Neg Hx     Prior to Admission medications   Medication Sig Start Date End Date Taking? Authorizing Provider  acetaminophen (TYLENOL) 160 MG/5ML elixir Take 500 mg by mouth every 4 (four) hours as needed for fever.   Yes [provider]  acetaminophen (TYLENOL) 500 MG tablet Take 1,000 mg by mouth 2 (two) times daily as needed (pain).   Yes [provider]  apixaban (ELIQUIS) 5 MG TABS tablet Take 1 tablet (5 mg total) by mouth 2 (two) times daily. 08/07/19 09/06/19 Yes Alma Friendly, MD  aspirin 81 MG chewable tablet Chew 1 tablet (81 mg total) by mouth daily. 08/08/19  Yes Alma Friendly, MD  carvedilol (COREG) 6.25 MG tablet Take 1 tablet (6.25 mg total) by mouth 2 (two) times daily with a meal. 08/07/19 09/06/19 Yes Alma Friendly, MD  docusate sodium (COLACE) 100 MG capsule Take 100 mg by mouth daily as needed for mild constipation.   Yes [provider]  ezetimibe (ZETIA) 10 MG tablet Take 1 tablet (10 mg total) by mouth daily. 08/08/19 09/07/19 Yes Alma Friendly, MD  loratadine (CLARITIN) 10 MG tablet Take 10 mg by mouth daily as needed for allergies.    Yes [provider]  nicotine (NICODERM CQ - DOSED IN MG/24 HOURS) 21 mg/24hr patch Place 1 patch (21 mg total) onto the skin daily. 08/07/19  09/06/19 Yes Alma Friendly, MD  nicotine polacrilex (NICORETTE) 4 MG gum Take 1 each (4 mg total) by mouth as needed for smoking cessation. 08/07/19 09/06/19 Yes Alma Friendly, MD  pantoprazole (PROTONIX) 40 MG tablet Take 1 tablet (40 mg total) by mouth daily at 12 noon. 08/07/19 09/06/19 Yes Alma Friendly, MD  rosuvastatin (CRESTOR) 10 MG tablet Take 1 tablet (10 mg total) by mouth daily at 6 PM. 08/07/19 09/06/19 Yes Alma Friendly, MD  sacubitril-valsartan (ENTRESTO) 49-51 MG Take 1 tablet by mouth 2 (two) times daily. 08/07/19 09/06/19 Yes Alma Friendly, MD  spironolactone (ALDACTONE) 25  MG tablet Take 0.5 tablets (12.5 mg total) by mouth daily. 08/08/19 09/07/19 Yes Alma Friendly, MD  potassium chloride (KLOR-CON) 10 MEQ tablet Take 1 tablet (10 mEq total) by mouth daily for 5 days. Patient not taking: Reported on 08/15/2019 08/07/19 08/12/19  Alma Friendly, MD    Physical Exam: Constitutional: Moderately built and nourished. Vitals:   08/15/19 0230 08/15/19 0245 08/15/19 0300 08/15/19 0315  BP: (!) 144/57 (!) 110/59 132/66 136/73  Pulse: (!) 38 (!) 39    Resp: 18 19 19 20   Temp:      TempSrc:      SpO2: (!) 88% (!) 87%     Eyes: Anicteric no pallor. ENMT: No discharge from the ears eyes nose or mouth. Neck: No mass felt.  No neck rigidity. Respiratory: No rhonchi or crepitations. Cardiovascular: S1-S2 heard. Abdomen: Soft nontender bowel sound present. Musculoskeletal: No edema. Skin: I see mild rash on the anterior part of the abdomen of the upper part. Neurologic: Alert awake oriented time place and person.  Moves all extremities with mild weakness of the left upper extremity rest of the extremities are 5 x 5.  No facial asymmetry tongue is midline pupils equal reactive light. Psychiatric: Appears normal per normal affect.   Labs on Admission: I have personally reviewed following labs and imaging studies  CBC: Recent Labs  Lab  08/14/19 2227 08/14/19 2234  WBC 9.0  --   NEUTROABS 5.7  --   HGB 13.4 12.9  HCT 40.7 38.0  MCV 88.7  --   PLT 298  --    Basic Metabolic Panel: Recent Labs  Lab 08/11/19 1138 08/14/19 2227 08/14/19 2234  NA 142 142 142  K 3.8 3.6 3.5  CL 108 107 107  CO2 25 22  --   GLUCOSE 159* 111* 107*  BUN 18 10 10   CREATININE 1.32* 1.22* 1.20*  CALCIUM 8.7 8.6*  --    GFR: Estimated Creatinine Clearance: 43.1 mL/min (A) (by C-G formula based on SCr of 1.2 mg/dL (H)). Liver Function Tests: Recent Labs  Lab 08/14/19 2227  AST 15  ALT 14  ALKPHOS 104  BILITOT 0.7  PROT 6.5  ALBUMIN 3.4*   No results for input(s): LIPASE, AMYLASE in the last 168 hours. No results for input(s): AMMONIA in the last 168 hours. Coagulation Profile: Recent Labs  Lab 08/14/19 2227  INR 1.4*   Cardiac Enzymes: No results for input(s): CKTOTAL, CKMB, CKMBINDEX, TROPONINI in the last 168 hours. BNP (last 3 results) No results for input(s): PROBNP in the last 8760 hours. HbA1C: No results for input(s): HGBA1C in the last 72 hours. CBG: Recent Labs  Lab 08/14/19 2226  GLUCAP 112*   Lipid Profile: No results for input(s): CHOL, HDL, LDLCALC, TRIG, CHOLHDL, LDLDIRECT in the last 72 hours. Thyroid Function Tests: No results for input(s): TSH, T4TOTAL, FREET4, T3FREE, THYROIDAB in the last 72 hours. Anemia Panel: No results for input(s): VITAMINB12, FOLATE, FERRITIN, TIBC, IRON, RETICCTPCT in the last 72 hours. Urine analysis:    Component Value Date/Time   COLORURINE STRAW (A) 08/14/2019 2345   APPEARANCEUR CLEAR 08/14/2019 2345   LABSPEC 1.004 (L) 08/14/2019 2345   PHURINE 6.0 08/14/2019 2345   GLUCOSEU NEGATIVE 08/14/2019 2345   HGBUR NEGATIVE 08/14/2019 2345   HGBUR negative 03/01/2007 1207   BILIRUBINUR NEGATIVE 08/14/2019 2345   BILIRUBINUR NEG 01/21/2017 1147   KETONESUR NEGATIVE 08/14/2019 2345   PROTEINUR NEGATIVE 08/14/2019 2345   UROBILINOGEN 0.2 01/21/2017 1147  UROBILINOGEN 0.2 01/12/2011 1203   NITRITE NEGATIVE 08/14/2019 2345   LEUKOCYTESUR NEGATIVE 08/14/2019 2345   Sepsis Labs: @LABRCNTIP (procalcitonin:4,lacticidven:4) )No results found for this or any previous visit (from the past 240 hour(s)).   Radiological Exams on Admission: CT HEAD CODE STROKE WO CONTRAST  Result Date: 08/14/2019 CLINICAL DATA:  Code stroke. Right sided weakness. Speech disturbance. Acute stroke presentation. EXAM: CT HEAD WITHOUT CONTRAST TECHNIQUE: Contiguous axial images were obtained from the base of the skull through the vertex without intravenous contrast. COMPARISON:  Head CT 08/04/2019.  MRI 08/02/2019. FINDINGS: Brain: No acute finding affects the brainstem or cerebellum. Previous suboccipital craniectomy. Cerebral hemispheres show subacute infarction in the right lateral basal ganglia and radiating white matter tracts as seen previously. No new acute infarction. No mass lesion, hemorrhage, hydrocephalus or extra-axial collection. Vascular: There is atherosclerotic calcification of the major vessels at the base of the brain. Skull: Otherwise negative Sinuses/Orbits: Clear/normal Other: None ASPECTS ([place] Stroke Program Early CT Score), assuming left hemisphere insult - Ganglionic level infarction (caudate, lentiform nuclei, internal capsule, insula, M1-M3 cortex): 7 - Supraganglionic infarction (M4-M6 cortex): 3 Total score (0-10 with 10 being normal): 10 IMPRESSION: 1. Subacute infarction in the right lateral basal ganglia/radiating white matter tracts as seen previously. No evidence of hemorrhagic transformation or extension. No acute or focal finding seen elsewhere. 2. ASPECTS is 10, assuming left hemispheric insult clinically. 3. These results were communicated to Dr. Cheral Marker at 10:40 pmon 2/28/2021by text page via the [facility] messaging system. Electronically Signed   By: Nelson Chimes M.D.   On: 08/14/2019 22:41    EKG: Independently reviewed.  Normal sinus rhythm  LBBB.  Assessment/Plan Principal Problem:   TIA (transient ischemic attack) Active Problems:   Essential hypertension   HLD (hyperlipidemia)   CKD (chronic kidney disease) stage 3, GFR 30-59 ml/min   History of CVA (cerebrovascular accident)   Paroxysmal atrial fibrillation ([place])    1. Possible TIA -appreciate neurology consult discussed with neurology at this time since patient failed swallow and patient is on Eliquis.  Neurologist recommended Lovenox per pharmacy dosing.  Discussed with pharmacy.  Patient is also on aspirin.  Neurochecks.  MRI brain has been ordered.  Speech therapy consult for swallow evaluation since patient failed swallow.  Rest of the stroke work-up has been done recently during recent admission last week. 2. Skin rash which is new for the patient since discharge.  Not sure which of the medication is causing it.  Patient is new to many of the medications he was discharged on.  For now patient n.p.o. and will be holding off Entresto and spironolactone for now and also will hold off Protonix.  Closely observe any further worsening.  I have ordered 1 dose of Benadryl.  No signs of any angioedema or anaphylaxis at this time. 3. Dilated cardiomyopathy with EF of 25 to 30% on LifeVest.  Presently n.p.o. and since patient has a new rash holding off Aldactone and Entresto and Protonix for now.  On Coreg.  Closely observe.  May need to discuss with cardiologist about the new rash. 4. A. fib presently rate controlled on Coreg and since patient has failed swallow we will keep you patient on Lovenox until patient can take orally apixaban. 5. Chronic kidney disease stage III creatinine appears to be at baseline. 6. Hyperlipidemia on Crestor. 7. Tobacco abuse recently quit. 8. Hypertension allow for permissive hypertension see #3.  As needed IV hydralazine for systolic blood pressure more than 694 and diastolic more  than 120 allowing for permissive hypertension.   DVT prophylaxis:  Lovenox full dose for now. Code Status: Full code. Family Communication: Discussed with patient. Disposition Plan: Home. Consults called: Neurology. Admission status: Observation.   Rise Patience MD [facility] Pager [phone].  If 7PM-7AM, please contact night-coverage [email or web address] Password [facility]  08/15/2019, 3:28 AM

## 2019-08-15 NOTE — Evaluation (Signed)
Physical Therapy Evaluation Patient Details Name: [name] MRN: [id number] DOB: Dec 10, 1950 Today's Date: 08/15/2019   History of Present Illness  Pt is a 69 y.o. female readmitted 08/14/19 with worsened LUE weakness. 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 On 2/18, pt noted to be in vfib without pulse, shocked and went into torsades and PEA; transfer to ICU; brief intubation 2/18. PMH includes anxiety, HTN, Chiari I malformation. MRI 08/15/19 showed some extension of recent CVA in R basal ganglia, no new CVA.   Clinical Impression  Pt admitted with above diagnosis. Pt is at supervision level with mobility which is similar to where she was when she last left the hospital. She has some mild balance deficits and decreased safety awareness. Would benefit from outpt PT to return to independence though she's not sure she wants to go.  Pt currently with functional limitations due to the deficits listed below (see PT Problem List). Pt will benefit from skilled PT to increase their independence and safety with mobility to allow discharge to the venue listed below.       Follow Up Recommendations Outpatient PT;Supervision for mobility/OOB (pt not sure she wants to do this, though husband could drive her. Rec HHPT if she decides against outpt neuro)    Equipment Recommendations  None recommended by PT    Recommendations for Other Services       Precautions / Restrictions Precautions Precautions: Fall Restrictions Weight Bearing Restrictions: No      Mobility  Bed Mobility Overal bed mobility: Needs Assistance Bed Mobility: Supine to Sit;Sit to Supine     Supine to sit: Supervision Sit to supine: Supervision   General bed mobility comments: pt able to come to EOB independently as well as return to supine  Transfers Overall transfer level: Needs assistance Equipment used: Rolling walker (2 wheeled) Transfers: Sit to/from Stand Sit to Stand: Min guard          General transfer comment: min-guard for safety, no physical assist needed  Ambulation/Gait Ambulation/Gait assistance: Min guard Gait Distance (Feet): 50 Feet Assistive device: Rolling walker (2 wheeled) Gait Pattern/deviations: Step-through pattern;Decreased stride length Gait velocity: Decreased   General Gait Details: pt steady with RW, needed vc's initially for attending to lines  Stairs            Wheelchair Mobility    Modified Rankin (Stroke Patients Only) Modified Rankin (Stroke Patients Only) Pre-Morbid Rankin Score: Moderate disability Modified Rankin: Moderate disability     Balance Overall balance assessment: Needs assistance Sitting-balance support: Feet supported;No upper extremity supported Sitting balance-Leahy Scale: Good     Standing balance support: During functional activity Standing balance-Leahy Scale: Fair Standing balance comment: able to maintain static standing with min guard assist                              Pertinent Vitals/Pain Pain Assessment: No/denies pain    Home Living Family/patient expects to be discharged to:: Private residence Living Arrangements: Spouse/significant other Available Help at Discharge: Family;Available 24 hours/day Type of Home: House Home Access: Stairs to enter   [employer] of Steps: 3 Home Layout: One level Home Equipment: Walker - 2 wheels;Bedside commode;Shower seat Additional Comments: 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    Prior Function Level of Independence: Needs assistance   Gait / Transfers Assistance Needed: has been ambulating with RW  ADL's / Homemaking Assistance Needed: husband has been doing  laundry, she has been cooking  Comments: Retired Theme park manager   Dominant Hand: Right    Extremity/Trunk Assessment   Upper Extremity Assessment Upper Extremity Assessment: Defer to OT evaluation    Lower Extremity Assessment Lower Extremity  Assessment: RLE deficits/detail;LLE deficits/detail RLE Deficits / Details: hip flex 4/5, knee flex/ ext 4/5, h/o R TKA RLE Sensation: WNL RLE Coordination: WNL LLE Deficits / Details: hip flex 4/5, 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 LLE Sensation: WNL LLE Coordination: WNL       Communication   Communication: No difficulties  Cognition Arousal/Alertness: Awake/alert Behavior During Therapy: Flat affect Overall Cognitive Status: History of cognitive impairments - at baseline                                        General Comments General comments (skin integrity, edema, etc.): SpO2 low 90's on RA, HR 67-73 bpm    Exercises     Assessment/Plan    PT Assessment Patient needs continued PT services  PT Problem List Decreased strength;Decreased activity tolerance;Decreased balance;Decreased mobility;Decreased cognition       PT Treatment Interventions Gait training;Stair training;Functional mobility training;Therapeutic activities;Therapeutic exercise;Balance training;Patient/family education;Cognitive remediation    PT Goals (Current goals can be found in the Care Plan section)  Acute Rehab PT Goals Patient Stated Goal: get back to normal life PT Goal Formulation: With patient Time For Goal Achievement: 09/02/19 Potential to Achieve Goals: Good    Frequency Min 4X/week   Barriers to discharge        Co-evaluation               AM-PAC PT "6 Clicks" Mobility  Outcome Measure Help needed turning from your back to your side while in a flat bed without using bedrails?: None Help needed moving from lying on your back to sitting on the side of a flat bed without using bedrails?: None Help needed moving to and from a bed to a chair (including a wheelchair)?: None Help needed standing up from a chair using your arms (e.g., wheelchair or bedside chair)?: None Help needed to walk in hospital room?: A Little Help needed climbing 3-5 steps with a railing? : A Little 6  Click Score: 22    End of Session Equipment Utilized During Treatment: Gait belt Activity Tolerance: Patient tolerated treatment well Patient left: with call bell/phone within reach;in bed;with bed alarm set;with family/visitor present(RN said no chair alarm ok) Nurse Communication: Mobility status PT Visit Diagnosis: Unsteadiness on feet (R26.81);Other abnormalities of gait and mobility (R26.89)    Time: 9509-3267 PT Time Calculation (min) (ACUTE ONLY): 35 min   Charges:     PT Treatments $Gait Training: 8-22 mins        Leighton Roach, [place]  Pager [phone] Office [place] 08/15/2019, 2:01 PM

## 2019-08-15 NOTE — Progress Notes (Signed)
OT Evaluation    08/15/19 1700  OT Visit Information  Last OT Received On 08/15/19  Assistance Needed +1  History of Present Illness Pt is a 69 y.o. female readmitted 08/14/19 with worsened LUE weakness. 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 On 2/18, pt noted to be in vfib without pulse, shocked and went into torsades and PEA; transfer to ICU; brief intubation 2/18. PMH includes anxiety, HTN, Chiari I malformation. MRI 08/15/19 showed some extension of recent CVA in R basal ganglia, no new CVA.   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; states her 3in1 will not fit in the tub   Lives With Spouse  Prior Function  Level of Independence Needs assistance  Gait / Transfers Assistance Needed has been ambulating with RW  ADL's / [place] husband has been doing laundry, she has been cooking  Comments Retired Administrator, Civil Service No difficulties  Pain Assessment  Pain Assessment No/denies pain  Cognition  Arousal/Alertness Awake/alert  Behavior During Therapy Flat affect  Overall Cognitive Status Impaired/Different from baseline (will further assess)  Current Attention Level Selective  General Comments Feel cognition is most likely impaired from baseilne/prior to CVA. Pt was doing her own finances, etc. Would benefit from further assesment  Upper Extremity Assessment  Upper  Extremity Assessment LUE deficits/detail  LUE Deficits / Details decreased grip strength and in-hand manipulation skills; pt staes sheis frequently dropping items since her CVA  LUE Coordination decreased fine motor  Lower Extremity Assessment  Lower Extremity Assessment Defer to PT evaluation  Cervical / Trunk Assessment  Cervical / Trunk Assessment Normal  ADL  Overall ADL's  Needs assistance/impaired  Functional mobility during ADLs Min guard;Rolling walker  General ADL Comments requires setup with UB ADL and minguard A with LB ADL. Educated pt/husband on strategies to reduce risk of falls  Vision- History  Baseline Vision/History No visual deficits;Wears glasses  Vision- Assessment  Vision Assessment? No apparent visual deficits  Bed Mobility  Overal bed mobility Modified Independent  Transfers  Overall transfer level Needs assistance  Equipment used Rolling walker (2 wheeled)  Transfers Sit to/from Stand  Sit to Stand Supervision  Balance  Overall balance assessment Needs assistance  Sitting-balance support Feet supported;No upper extremity supported  Sitting balance-Leahy Scale Good  Standing balance support During functional activity  Standing balance-Leahy Scale Fair  Standing balance comment able to maintain static standing with min guard assist   OT - End of Session  Equipment Utilized During Treatment Gait belt;Rolling walker  Activity Tolerance Patient tolerated treatment well  Patient left in bed;with call bell/phone within reach;with bed alarm set;with family/visitor present  Nurse Communication Mobility status  OT Assessment  OT Recommendation/Assessment All further OT needs can be met in the next venue of care  OT Visit Diagnosis Other abnormalities of gait and mobility (R26.89);Muscle weakness (generalized) (M62.81)  Symptoms and signs involving cognitive functions Cerebral infarction (previous)  OT Problem List Decreased strength;Decreased activity  tolerance;Decreased coordination;Cardiopulmonary status limiting activity;Impaired UE functional use  AM-PAC OT "6 Clicks" Daily Activity Outcome Measure (Version 2)  Help from another person eating meals? 4  Help from another person taking care of personal grooming? 3  Help from another person toileting, which includes using toliet, bedpan, or urinal? 3  Help from another person bathing (including washing, rinsing, drying)? 3  Help from another person to put on and taking off regular upper body clothing? 3  Help from another person to put on and taking off regular lower body clothing? 3  6 Click Score 19  OT Recommendation  Follow Up Recommendations Outpatient OT;Supervision/Assistance - 24 hour  OT Equipment Tub/shower seat  Individuals Consulted  Consulted and Agree with Results and Recommendations Patient  Acute Rehab OT Goals  Patient Stated Goal get back to normal life  OT Goal Formulation All assessment and education complete, DC therapy  OT Time Calculation  OT Start Time (ACUTE ONLY) 1450  OT Stop Time (ACUTE ONLY) 1506  OT Time Calculation (min) 16 min  OT General Charges  $OT Visit 1 Visit  OT Evaluation  $OT Eval Low Complexity 1 Low  Written Expression  Dominant Hand Right  Maurie Boettcher, OT/L   Acute OT Clinical Specialist Acute Rehabilitation Services Pager [phone] Office [phone]

## 2019-08-15 NOTE — Plan of Care (Signed)
Adequate for discharge.

## 2019-08-15 NOTE — TOC Transition Note (Signed)
Transition of Care [facility]) - CM/SW Discharge Note   Patient Details  Name: [name] MRN: [id number] Date of Birth: 1950/07/31  [facility] CM/SW Contact:  Pollie Friar, RN Phone Number: 08/15/2019, 4:22 PM   Clinical Narrative:    Pt discharging home with outpatient therapy. CM spoke with the patient and her spouse and they are agreeable to outpatient therapy at the [facility]. Orders in Epic and information on the AVS.  Pt has supervision at home and transportation to home.   Final next level of care: OP Rehab Barriers to Discharge: No Barriers Identified   Patient Goals and CMS Choice     Choice offered to / list presented to : Patient  Discharge Placement                       Discharge Plan and Services                                     Social Determinants of Health (SDOH) Interventions     Readmission Risk Interventions No flowsheet data found.

## 2019-08-15 NOTE — Discharge Summary (Signed)
Physician Discharge Summary  [name] YSA:[id number] DOB: May 16, 1951 DOA: 08/14/2019  PCP: Pleas Koch, NP  Admit date: 08/14/2019 Discharge date: 08/15/2019  Admitted From: home Discharge disposition: home   Recommendations for Outpatient Follow-Up:   1. Follow-up with neuro as scheduled.  Neurology's office will contact you for an appointment 2. Outpatient rehab/PT recommended.  They will contact you to schedule an appointment as well   Discharge Diagnosis:   Principal Problem:   TIA (transient ischemic attack) Active Problems:   History of CVA (cerebrovascular accident)   Essential hypertension   CKD (chronic kidney disease) stage 3, GFR 30-59 ml/min   HLD (hyperlipidemia)   Paroxysmal atrial fibrillation ([place])    Discharge Condition: Improved.  Diet recommendation: Low sodium, heart healthy.    Wound care: None.  Code status: Full.   History of Present Illness:   [name] is a 69 y.o. female with history of recent stroke with left-sided weakness and A. fib during that admission patient also had a cardiac arrest had LifeVest presented to ED 2/28 after experiencing some sore throat-like feeling and difficulty to bring out words with difficulty to control the remote of her TV felt weak on the right upper extremity. all of them which lasted for few minutes and resolved.  Patient  known history of left-sided weakness from the previous stroke which as per the patient had actually improved.  Patient was brought into the ER as a code stroke.  Patient in addition stated that since discharge on February 21 patient had been having some rash and itching mainly on the trunk and neck area with no swelling of the tongue or lips or any difficulty breathing.   Hospital Course by Problem:   #1.  TIA.  CT of the head shows no acute abnormality.  There is a subacute infarction in the right lateral basal ganglia/radiating white matter tracts as seen previously  with no evidence of hemorrhagic transformation or extension.  MRI with acute/subacute infarction changes again demonstrated within the right basal ganglia and radiating white matter tracts, infarction changes along the posterior aspect of the infarction territory have slightly increased in extent from August 02, 2019, no new acute infarct demonstrated elsewhere within the brain, stable chronic small vessel ischemic disease with chronic bilateral basal ganglia lacunar infarcts, redemonstrated Chiari I malformation with sequela of prior Chiari decompression.  Evaluated by neurology who opined most likely TIA as her symptoms localized to the left MCA territory.  Recommend continuing Eliquis and aspirin and statin.  She failed initial swallow eval however last week when she was evaluated by speech therapy they recommended a dysphagia 3 honey thick liquids.  Seen by speech therapy today who recommend continuation of same.  Physical therapy recommended outpatient rehab patient is not sure she wants to participate.  Symptoms resolved and patient is back at baseline on day of discharge  #2.  Skin rash.  Itching not as bad.  No signs of angioedema. Continue benadryl. OP follow up  3.  Dilated cardiomyopathy with an EF of 25 to 30% on LifeVest.  Home medications include Aldactone and Entresto.  Continue Coreg as well.  Follow-up with cardiology outpatient  #4.  A. fib.  Rate controlled.  Home medications include Coreg and Eliquis.  We will resume these at discharge  #5.  Chronic kidney disease stage III.  Creatinine appears to be at baseline  #6.  Hyperlipidemia.  Crestor per neurology to be continued  #7.  Hypertension.  Blood pressure 140/54 at discharge.  Home medications include Coreg, Entresto, Aldactone.  Resume home meds at discharge     Medical Consultants:   [facility]eth[facility] neuro  Discharge Exam:   Vitals:   08/15/19 1240 08/15/19 1453  BP: (!) 94/58 (!) 140/54  Pulse:    Resp:    Temp:    SpO2:      Vitals:   08/15/19 1200 08/15/19 1235 08/15/19 1240 08/15/19 1453  BP: (!) 127/43 (!) 127/43 (!) 94/58 (!) 140/54  Pulse: (!) 53 64    Resp: 20 18    Temp: 98.9 F (37.2 C) 98.7 F (37.1 C)    TempSrc: Oral Oral    SpO2:  92%    Weight:      Height:        General exam: Appears calm and comfortable no acute distress Respiratory system: Clear to auscultation. Respiratory effort normal. Cardiovascular system: iregularly irregular. No JVD,  rubs, gallops or clicks. No murmurs. Gastrointestinal system: Abdomen is nondistended, soft and nontender. No organomegaly or masses felt. Normal bowel sounds heard. Central nervous system: Alert and oriented. No focal neurological deficits. Left grip 4/5 right grip 5/5. Speech slow but clear. Facial symmetry Extremities: No clubbing,  or cyanosis. No edema.moves all extremities spontaniously Skin: No rashes, lesions or ulcers. Psychiatry: Judgement and insight appear normal. Mood & affect appropriate.    The results of significant diagnostics from this hospitalization (including imaging, microbiology, ancillary and laboratory) are listed below for reference.     Procedures and Diagnostic Studies:   MR BRAIN WO CONTRAST  Result Date: 08/15/2019 CLINICAL DATA:  Focal neuro deficit, greater than 6 hours, stroke suspected. Additional history provided CHIEF COMPLAINT: Right-sided weakness.  Right-sided weakness. EXAM: MRI HEAD WITHOUT CONTRAST TECHNIQUE: Multiplanar, multiecho pulse sequences of the brain and surrounding structures were obtained without intravenous contrast. COMPARISON:  Noncontrast head CT 08/14/2019, brain MRI/MRA 08/02/2019. FINDINGS: Brain: Again demonstrated is abnormal restricted diffusion within the right basal ganglia and radiating white matter tracts consistent with acute/subacute infarction. Restricted diffusion is slightly increased in extent along the posterior aspect of the infarction territory as compared to brain MRI  08/02/2019. Corresponding T2/FLAIR hyperintensity at this site. No significant mass effect, effacement of the ventricular system or midline shift. No acute infarct is demonstrated elsewhere within the brain. Redemonstrated patchy T2/FLAIR hyperintensity within the cerebral white matter which is nonspecific, but consistent with chronic small vessel ischemic disease. Redemonstrated chronic lacunar infarcts within bilateral basal ganglia. No evidence of intracranial mass. No extra-axial fluid collection. No chronic intracranial blood products. Cerebral volume is normal for age. Redemonstrated Chiari I malformation with sequela of prior posterior fossa decompression. Vascular: Sequela prior posterior Skull and upper cervical spine: Sequela of prior Chiari decompression. No focal marrow lesion. Sinuses/Orbits: Visualized orbits demonstrate no acute abnormality. Minimal ethmoid sinus mucosal thickening left mastoid effusion. Trace fluid also present within right mastoid air cells. IMPRESSION: Acute/subacute infarction changes again demonstrated within the right basal ganglia and radiating white matter tracts. Infarction changes along the posterior aspect of the infarction territory have slightly increased in extent from MRI 08/02/2019. No new acute infarct demonstrated elsewhere within the brain. Stable chronic small vessel ischemic disease with chronic bilateral basal ganglia lacunar infarcts. Redemonstrated Chiari I malformation with sequela of prior Chiari decompression. Left mastoid effusion. Electronically Signed   By: Kellie Simmering DO   On: 08/15/2019 08:09   CT HEAD CODE STROKE WO CONTRAST  Result Date: 08/14/2019 CLINICAL DATA:  Code stroke.  Right sided weakness. Speech disturbance. Acute stroke presentation. EXAM: CT HEAD WITHOUT CONTRAST TECHNIQUE: Contiguous axial images were obtained from the base of the skull through the vertex without intravenous contrast. COMPARISON:  Head CT 08/04/2019.  MRI 08/02/2019.  FINDINGS: Brain: No acute finding affects the brainstem or cerebellum. Previous suboccipital craniectomy. Cerebral hemispheres show subacute infarction in the right lateral basal ganglia and radiating white matter tracts as seen previously. No new acute infarction. No mass lesion, hemorrhage, hydrocephalus or extra-axial collection. Vascular: There is atherosclerotic calcification of the major vessels at the base of the brain. Skull: Otherwise negative Sinuses/Orbits: Clear/normal Other: None ASPECTS ([place] Stroke Program Early CT Score), assuming left hemisphere insult - Ganglionic level infarction (caudate, lentiform nuclei, internal capsule, insula, M1-M3 cortex): 7 - Supraganglionic infarction (M4-M6 cortex): 3 Total score (0-10 with 10 being normal): 10 IMPRESSION: 1. Subacute infarction in the right lateral basal ganglia/radiating white matter tracts as seen previously. No evidence of hemorrhagic transformation or extension. No acute or focal finding seen elsewhere. 2. ASPECTS is 10, assuming left hemispheric insult clinically. 3. These results were communicated to Dr. Cheral Marker at 10:40 pmon 2/28/2021by text page via the [facility] messaging system. Electronically Signed   By: Nelson Chimes M.D.   On: 08/14/2019 22:41     Labs:   Basic Metabolic Panel: Recent Labs  Lab 08/11/19 1138 08/11/19 1138 08/14/19 2227 08/14/19 2227 08/14/19 2234 08/15/19 0748  NA 142  --  142  --  142 143  K 3.8   < > 3.6   < > 3.5 3.5  CL 108  --  107  --  107 109  CO2 25  --  22  --   --  24  GLUCOSE 159*  --  111*  --  107* 81  BUN 18  --  10  --  10 8  CREATININE 1.32*  --  1.22*  --  1.20* 1.06*  CALCIUM 8.7  --  8.6*  --   --  8.4*  MG  --   --   --   --   --  1.8   < > = values in this interval not displayed.   GFR Estimated Creatinine Clearance: 48.8 mL/min (A) (by C-G formula based on SCr of 1.06 mg/dL (H)). Liver Function Tests: Recent Labs  Lab 08/14/19 2227 08/15/19 0748  AST 15 14*  ALT 14 13    ALKPHOS 104 89  BILITOT 0.7 0.5  PROT 6.5 5.9*  ALBUMIN 3.4* 3.1*   No results for input(s): LIPASE, AMYLASE in the last 168 hours. No results for input(s): AMMONIA in the last 168 hours. Coagulation profile Recent Labs  Lab 08/14/19 2227  INR 1.4*    CBC: Recent Labs  Lab 08/14/19 2227 08/14/19 2234 08/15/19 0748  WBC 9.0  --  7.8  NEUTROABS 5.7  --  5.2  HGB 13.4 12.9 12.4  HCT 40.7 38.0 37.8  MCV 88.7  --  89.6  PLT 298  --  273   Cardiac Enzymes: No results for input(s): CKTOTAL, CKMB, CKMBINDEX, TROPONINI in the last 168 hours. BNP: Invalid input(s): POCBNP CBG: Recent Labs  Lab 08/14/19 2226  GLUCAP 112*   D-Dimer No results for input(s): DDIMER in the last 72 hours. Hgb A1c No results for input(s): HGBA1C in the last 72 hours. Lipid Profile No results for input(s): CHOL, HDL, LDLCALC, TRIG, CHOLHDL, LDLDIRECT in the last 72 hours. Thyroid function studies No results for input(s): TSH, T4TOTAL, T3FREE, THYROIDAB in  the last 72 hours.  Invalid input(s): FREET3 Anemia work up No results for input(s): VITAMINB12, FOLATE, FERRITIN, TIBC, IRON, RETICCTPCT in the last 72 hours. Microbiology Recent Results (from the past 240 hour(s))  SARS CORONAVIRUS 2 (TAT 6-24 HRS) Nasopharyngeal Nasopharyngeal Swab     Status: None   Collection Time: 08/15/19  1:00 AM   Specimen: Nasopharyngeal Swab  Result Value Ref Range Status   SARS Coronavirus 2 NEGATIVE NEGATIVE Final    Comment: (NOTE) SARS-CoV-2 target nucleic acids are NOT DETECTED. The SARS-CoV-2 RNA is generally detectable in upper and lower respiratory specimens during the acute phase of infection. Negative results do not preclude SARS-CoV-2 infection, do not rule out co-infections with other pathogens, and should not be used as the sole basis for treatment or other patient management decisions. Negative results must be combined with clinical observations, patient history, and epidemiological  information. The expected result is Negative. Fact Sheet for Patients: SugarRoll.be Fact Sheet for Healthcare Providers: https://www.woods-mathews.com/ This test is not yet approved or cleared by the [place] FDA and  has been authorized for detection and/or diagnosis of SARS-CoV-2 by FDA under an Emergency Use Authorization (EUA). This EUA will remain  in effect (meaning this test can be used) for the duration of the COVID-19 declaration under Section 56 4(b)(1) of the Act, 21 U.S.C. section 360bbb-3(b)(1), unless the authorization is terminated or revoked sooner. Performed at [facility], [place] [address] [place], [place] [address]      Discharge Instructions:   Discharge Instructions    Ambulatory referral to Neurology   Complete by: As directed    Follow up in stroke clinic at [facility] with Frann Rider, NP in about 4 weeks. If not available, consider Dr. Antony Contras, Dr. Bess Harvest, or Dr. Sarina Ill.   Ambulatory referral to Occupational Therapy   Complete by: As directed    Ambulatory referral to Physical Therapy   Complete by: As directed    Ambulatory referral to Speech Therapy   Complete by: As directed    Call MD for:  difficulty breathing, headache or visual disturbances   Complete by: As directed    Call MD for:  extreme fatigue   Complete by: As directed    Call MD for:  persistant dizziness or light-headedness   Complete by: As directed    Diet - low sodium heart healthy   Complete by: As directed    Discharge instructions   Complete by: As directed    Take medications as prescribed Follow up with neuro as scheduled   Increase activity slowly   Complete by: As directed      Allergies as of 08/15/2019      Reactions   Shellfish-derived Products Anaphylaxis   Ativan [lorazepam] Other (See Comments)   Makes "skin crawl" and insomnia   Buspar [buspirone] Nausea Only    Sweating and dizzy   Clarithromycin Swelling   Codeine Nausea And Vomiting   Doxycycline Other (See Comments)   Unknown   Guaifenesin Other (See Comments)   Palpitations   Oxycodone Nausea And Vomiting   Prednisone Nausea And Vomiting, Other (See Comments)   Makes my heart race   Statins Other (See Comments)   Muscle pain   Sulfonamide Derivatives Nausea And Vomiting   Achiness   Tetracycline Nausea Only   Vicodin [hydrocodone-acetaminophen] Nausea And Vomiting   Famotidine Rash   Latex Rash   Omeprazole Nausea Only   Cough, shortness of breath - patient  doesn't remember   Peanut-containing Drug Products Itching, Rash   Wellbutrin [bupropion] Palpitations      Medication List    STOP taking these medications   potassium chloride 10 MEQ tablet Commonly known as: KLOR-CON     TAKE these medications   acetaminophen 500 MG tablet Commonly known as: TYLENOL Take 1,000 mg by mouth 2 (two) times daily as needed (pain).   acetaminophen 160 MG/5ML elixir Commonly known as: TYLENOL Take 500 mg by mouth every 4 (four) hours as needed for fever.   apixaban 5 MG Tabs tablet Commonly known as: ELIQUIS Take 1 tablet (5 mg total) by mouth 2 (two) times daily.   aspirin 81 MG chewable tablet Chew 1 tablet (81 mg total) by mouth daily.   carvedilol 6.25 MG tablet Commonly known as: COREG Take 1 tablet (6.25 mg total) by mouth 2 (two) times daily with a meal.   docusate sodium 100 MG capsule Commonly known as: COLACE Take 100 mg by mouth daily as needed for mild constipation.   ezetimibe 10 MG tablet Commonly known as: ZETIA Take 1 tablet (10 mg total) by mouth daily.   loratadine 10 MG tablet Commonly known as: CLARITIN Take 10 mg by mouth daily as needed for allergies.   nicotine 21 mg/24hr patch Commonly known as: NICODERM CQ - dosed in mg/24 hours Place 1 patch (21 mg total) onto the skin daily.   nicotine polacrilex 4 MG gum Commonly known as: NICORETTE Take 1  each (4 mg total) by mouth as needed for smoking cessation.   pantoprazole 40 MG tablet Commonly known as: PROTONIX Take 1 tablet (40 mg total) by mouth daily at 12 noon.   rosuvastatin 10 MG tablet Commonly known as: CRESTOR Take 1 tablet (10 mg total) by mouth daily at 6 PM.   sacubitril-valsartan 49-51 MG Commonly known as: ENTRESTO Take 1 tablet by mouth 2 (two) times daily.   spironolactone 25 MG tablet Commonly known as: ALDACTONE Take 0.5 tablets (12.5 mg total) by mouth daily.      Follow-up Information    [facility] Follow up in 4 week(s).   Specialty: Neurology Why: stroke clinic. office will call with appt date and time.  Contact information: [address] [place] Follow up.   Specialty: Rehabilitation Why: The oupatient therapy will contact you for the first home visit. Contact information: [address] [place] [id number] [place] [address] [phone]           Time coordinating discharge: 45 minutes  Signed:  Radene Gunning NP  [facility] 08/15/2019, 3:14 PM

## 2019-08-15 NOTE — ED Notes (Signed)
Report given to Sherlynn Stalls, Therapist, sports. All questions answered

## 2019-08-15 NOTE — Progress Notes (Signed)
Pt left unit for MRI via bed .[name] Monitor tech made aware,

## 2019-08-17 ENCOUNTER — Other Ambulatory Visit: Payer: Self-pay | Admitting: Cardiology

## 2019-08-17 ENCOUNTER — Telehealth: Payer: Self-pay

## 2019-08-17 DIAGNOSIS — I5023 Acute on chronic systolic (congestive) heart failure: Secondary | ICD-10-CM

## 2019-08-17 NOTE — Telephone Encounter (Signed)
She may take Benadryl 25 mg p.o. daily as needed for itching to see if that improves her symptoms.  Medication should be available over-the-counter.  Rex Kras, DO, [facility]

## 2019-08-17 NOTE — Telephone Encounter (Signed)
Patient called and asked if she is able to take Benadryl due to her life vest itching her. She said she was given a benadryl the other day at the hospital and it helped.?

## 2019-08-18 ENCOUNTER — Telehealth: Payer: Self-pay

## 2019-08-18 NOTE — Telephone Encounter (Signed)
Patient called asking if she can take benadryl because her life vest is itching.

## 2019-08-18 NOTE — Telephone Encounter (Signed)
She may take Benadryl 25 mg p.o. daily as needed for itching to see if that improves her symptoms.  Medication should be available over-the-counter.  Rex Kras, DO, [facility]

## 2019-08-18 NOTE — Telephone Encounter (Signed)
Spoke with pt ok to take benadryl

## 2019-08-22 ENCOUNTER — Encounter: Payer: Self-pay | Admitting: Occupational Therapy

## 2019-08-22 ENCOUNTER — Ambulatory Visit: Payer: Medicare HMO | Attending: Internal Medicine | Admitting: Physical Therapy

## 2019-08-22 ENCOUNTER — Other Ambulatory Visit: Payer: Self-pay

## 2019-08-22 ENCOUNTER — Ambulatory Visit: Payer: Medicare HMO | Admitting: Speech Pathology

## 2019-08-22 ENCOUNTER — Ambulatory Visit: Payer: Medicare HMO | Admitting: Occupational Therapy

## 2019-08-22 DIAGNOSIS — I69315 Cognitive social or emotional deficit following cerebral infarction: Secondary | ICD-10-CM | POA: Insufficient documentation

## 2019-08-22 DIAGNOSIS — I4901 Ventricular fibrillation: Secondary | ICD-10-CM | POA: Diagnosis not present

## 2019-08-22 DIAGNOSIS — R41841 Cognitive communication deficit: Secondary | ICD-10-CM

## 2019-08-22 DIAGNOSIS — R208 Other disturbances of skin sensation: Secondary | ICD-10-CM

## 2019-08-22 DIAGNOSIS — R278 Other lack of coordination: Secondary | ICD-10-CM

## 2019-08-22 DIAGNOSIS — R2681 Unsteadiness on feet: Secondary | ICD-10-CM

## 2019-08-22 DIAGNOSIS — R1312 Dysphagia, oropharyngeal phase: Secondary | ICD-10-CM

## 2019-08-22 DIAGNOSIS — I509 Heart failure, unspecified: Secondary | ICD-10-CM | POA: Diagnosis not present

## 2019-08-22 DIAGNOSIS — R2689 Other abnormalities of gait and mobility: Secondary | ICD-10-CM | POA: Insufficient documentation

## 2019-08-22 DIAGNOSIS — M6281 Muscle weakness (generalized): Secondary | ICD-10-CM | POA: Insufficient documentation

## 2019-08-22 DIAGNOSIS — I69354 Hemiplegia and hemiparesis following cerebral infarction affecting left non-dominant side: Secondary | ICD-10-CM | POA: Insufficient documentation

## 2019-08-22 DIAGNOSIS — R209 Unspecified disturbances of skin sensation: Secondary | ICD-10-CM | POA: Insufficient documentation

## 2019-08-22 NOTE — Therapy (Signed)
[facility] [address] [place], [place], [address] Phone: [phone]   Fax:  [phone]  Occupational Therapy Treatment  Patient Details  Name: [name] MRN: [id number] Date of Birth: [birth year] Referring Provider (OT): Will send to PCP Alma Friendly   Encounter Date: 08/22/2019  OT End of Session - 08/22/19 1332    Visit Number  1    Number of Visits  9    Date for OT Re-Evaluation  10/26/19    Authorization Type  [employer]    Authorization - Visit Number  1    Authorization - Number of Visits  10    Progress Note Due on Visit  10    OT Start Time  1230    OT Stop Time  1315    OT Time Calculation (min)  45 min    Activity Tolerance  Patient tolerated treatment well    Behavior During Therapy  Anxious       Past Medical History:  Diagnosis Date  . Allergy   . Anxiety   . Arnold-Chiari malformation ([place])   . Arthritis   . CHF (congestive heart failure) ([place])   . Coronary artery disease   . GERD (gastroesophageal reflux disease)   . H. pylori infection    3 years ago  . H/O cardiac arrest    Hx of VT/Vfib arrest  . Hyperlipidemia   . Incontinence   . Paroxysmal atrial fibrillation ([place]) 08/06/2019  . Syncope and collapse    Per pt, denies passing out  . Tobacco use disorder   . Unspecified essential hypertension     Past Surgical History:  Procedure Laterality Date  . APPENDECTOMY    . ARNOLD CHIARI SURGERY     neurocranial surgery  . BLEPHAROPLASTY     Bil  . C-EYE SURGERY PROCEDURE    . CARDIAC CATHETERIZATION    . CHOLECYSTECTOMY    . EYE SURGERY    . heart failure    . LEFT HEART CATH AND CORONARY ANGIOGRAPHY N/A 08/04/2019   Procedure: LEFT HEART CATH AND CORONARY ANGIOGRAPHY;  Surgeon: Adrian Prows, MD;  Location: [facility];  Service: Cardiovascular;  Laterality: N/A;  . MASS EXCISION Right 12/27/2013   Procedure: MINOR EXCISION OF RIGHT THUMB MUCOID CYST,  DEBRIDEMENT OF INTERPHALANGEAL JOINT;  Surgeon: Cammie Sickle, MD;  Location: [facility];  Service: Orthopedics;  Laterality: Right;  . mass on thumb  right  . TOTAL KNEE ARTHROPLASTY Right 02/17/2017  . TOTAL KNEE ARTHROPLASTY Right 02/17/2017   Procedure: TOTAL KNEE ARTHROPLASTY;  Surgeon: Melrose Nakayama, MD;  Location: [facility];  Service: Orthopedics;  Laterality: Right;  . TUBAL LIGATION      There were no vitals filed for this visit.  Subjective Assessment - 08/22/19 1237    Subjective   I think this walker gets in my way.    Pertinent History  Arnold Chiari Malformation corrected 1992, AFIB, CKD, HTN    Currently in Pain?  No/denies    Pain Score  0-No pain         [facility] OT Assessment - 08/22/19 0001      Assessment   Medical Diagnosis  BG Stroke    Referring Provider (OT)  Will send to PCP Alma Friendly    Onset Date/Surgical Date  08/15/19    Hand Dominance  Right    Next MD Visit  Needs to F/U with Neuro within the month  Prior Therapy  No      Precautions   Precautions  Fall    Precaution Comments  blood thinners      Balance Screen   Has the patient fallen in the past 6 months  No      Prior Function   Level of Independence  Independent with basic ADLs    Vocation  Retired    [employer]  Retired recently - owned a Transport planner, Quarry manager projects, cooking , Control and instrumentation engineer, spend time with 17 yr old grandson      ADL   Eating/Feeding  Independent    Grooming  Moderate assistance   husband washes hair   Upper Body Bathing  Modified independent    Lower Body Bathing  Increased time    Upper Body Dressing  Minimal assistance    Lower Body Dressing  Increased time    Production designer, theatre/television/film  Modified independent      IADL   Prior Level of Function Shopping  Independent    Shopping  Needs to be accompanied on any shopping  trip    Prior Level of Function Light Housekeeping  Independent    Light Housekeeping  Performs light daily tasks such as dishwashing, bed making    Prior Level of Function Meal Prep  Independent    Meal Prep  Able to complete simple warm meal prep    Prior Level of Function Sales executive  Relies on family or friends for transportation    Prior Level of Function Medication Managment  Independent    Medication Management  Has difficulty remembering to take medication    Prior Level of Function Loss adjuster, chartered financial matters independently (budgets, writes checks, pays rent, bills goes to bank), collects and keeps track of income   per patient     Written Expression   Dominant Hand  Right      Vision - History   Baseline Vision  Wears glasses all the time    Visual History  Other (comment)   "some issues with my retinas - monitoring     Vision Assessment   Eye Alignment  Within Functional Limits    Ocular Range of Motion  Within Functional Limits    Tracking/Visual Pursuits  Able to track stimulus in all quads without difficulty    Comment  Reports no changes to vision      Activity Tolerance   Activity Tolerance  Tolerates 10-20 min activity with multiple rests    Activity Tolerance Comments  Reports significant fatigue      Cognition   Overall Cognitive Status  Impaired/Different from baseline    Attention  Sustained    Memory  Impaired    Memory Impairment  Storage deficit    Awareness  Impaired    Awareness Impairment  Emergent impairment    Cognition Comments  Patient reports feeling anxious a lot of the time, seems internally distrracted.  Halter monitor very distracting to her      Observation/Other Assessments   Focus on Therapeutic Outcomes (FOTO)   TBD      Posture/Postural Control   Posture/Postural Control  No significant limitations      Sensation   Light Touch   Appears Intact    Stereognosis  Appears Intact  Hot/Cold  Appears Intact    Proprioception  Impaired by gross assessment      Coordination   Gross Motor Movements are Fluid and Coordinated  No    Fine Motor Movements are Fluid and Coordinated  No    9 Hole Peg Test  Right;Left    Right 9 Hole Peg Test  30.44    Left 9 Hole Peg Test  41.19      Perception   Perception  Within Functional Limits      Praxis   Praxis  Intact      ROM / Strength   AROM / PROM / Strength  AROM;Strength      AROM   Overall AROM   Deficits    Overall AROM Comments  Left shoulder    AROM Assessment Site  Shoulder    Right/Left Shoulder  Left    Left Shoulder Flexion  140 Degrees    Left Shoulder ABduction  145 Degrees      Strength   Overall Strength  Deficits    Overall Strength Comments  4-/5 proximally Left shoulder, 4/5 proximally Right shoulder      Hand Function   Right Hand Gross Grasp  Impaired    Right Hand Grip (lbs)  35    Right Hand Lateral Pinch  16 lbs    Right Hand 3 Point Pinch  10 lbs    Left Hand Gross Grasp  Impaired    Left Hand Grip (lbs)  20    Left Hand Lateral Pinch  10 lbs    Left 3 point pinch  8 lbs                       OT Education - 08/22/19 1331    Education Details  Reviewed results of OT eval, and potential plan of care    Person(s) Educated  Patient    Methods  Explanation    Comprehension  Verbalized understanding       OT Short Term Goals - 08/22/19 1342      OT SHORT TERM GOAL #1   Title  Patient will complete a home exercise program designed to improve hand strength    Time  4    Period  Weeks    Status  New    Target Date  09/26/19      OT SHORT TERM GOAL #2   Title  Patient will complete a home activities program designed to include non dominant left UE in familiar functional activities    Time  4    Period  Weeks    Status  New      OT SHORT TERM GOAL #3   Title  Patient will complete HEP designed to improve range  of motion and strength in BUE proximally    Time  4    Period  Weeks    Status  New      OT SHORT TERM GOAL #4   Title  Patient will dress herself with modified independence    Time  4    Period  Weeks    Status  New        OT Long Term Goals - 08/22/19 1343      OT LONG TERM GOAL #1   Title  Patient will complete updated HEP's and Home activities Programs    Time  8    Period  Weeks    Status  New    Target  Date  10/26/19      OT LONG TERM GOAL #2   Title  Patient will demonstrate sufficient endurance to complete at least 30 minutes of housework - light cleaningcooking/laundry    Time  8    Period  Weeks    Status  New      OT LONG TERM GOAL #3   Title  Patient will demonstrate 5 lb increase in left grip strength to reduce frequency of dropping items    Time  8    Period  Weeks    Status  New      OT LONG TERM GOAL #4   Title  Patient will demonstrate at least 160 degrees of Left shoulder flexion and abduction to promote overhead reaching    Time  8    Period  Weeks    Status  New            Plan - 08/22/19 1333    Clinical Impression Statement  Patient is a 69 year old woman admitted for OT/PT/SLP evals after recent BG Infarct- [birth year] - with complications during hospitaliztion - respratory arrest, on vent in ICU. Patient returned to hospital on 2/28 with TIA like symptoms.  Patient arrived today with halter monitor to assess cardiac status (patient with paroxysmal Afib)  Patient was independent with ADL/IADL prior to these medical events, but reports a several month history of declining endurance.  Patient with PMH significant for CKD, HTN, and Arnold Chiari malformation - corrected in 1992. Patient demonstrates the following deficits which impede her independent and efficient participation with ADL/IADL, Left sided hemiparesis/weakness, decreased coordination, decreased balance, and decreased endurance.  Patient will benefit from skilled OT intervention to  increase her autonomy with ADL/IADL.    OT Occupational Profile and History  Detailed Assessment- Review of Records and additional review of physical, cognitive, psychosocial history related to current functional performance    Occupational performance deficits (Please refer to evaluation for details):  ADL's;IADL's;Leisure;Rest and Sleep    Body Structure / Function / Physical Skills  ADL;Coordination;Endurance;GMC;UE functional use;Balance;Decreased knowledge of precautions;Sensation;IADL;Flexibility;Decreased knowledge of use of DME;Body mechanics;Cardiopulmonary status limiting activity;Dexterity;FMC;Tone;ROM    Cognitive Skills  Attention;Emotional;Memory;Safety Awareness;Problem Solve;Thought    Rehab Potential  Good    Clinical Decision Making  Several treatment options, min-mod task modification necessary    Comorbidities Affecting Occupational Performance:  May have comorbidities impacting occupational performance   Chronic kidney disease, hypertension, afib   Modification or Assistance to Complete Evaluation   Min-Moderate modification of tasks or assist with assess necessary to complete eval    OT Frequency  1x / week    OT Duration  8 weeks    OT Treatment/Interventions  Self-care/ADL training;Therapeutic exercise;Neuromuscular education;Splinting;Patient/family education;Visual/perceptual remediation/compensation;Coping strategies training;Therapeutic activities;Balance training;DME and/or AE instruction;Manual Therapy;Cognitive remediation/compensation    Plan  Begin hand strengthening and coordiantion program, review goals.    Consulted and Agree with Plan of Care  Patient       Patient will benefit from skilled therapeutic intervention in order to improve the following deficits and impairments:   Body Structure / Function / Physical Skills: ADL, Coordination, Endurance, GMC, UE functional use, Balance, Decreased knowledge of precautions, Sensation, IADL, Flexibility, Decreased  knowledge of use of DME, Body mechanics, Cardiopulmonary status limiting activity, Dexterity, FMC, Tone, ROM Cognitive Skills: Attention, Emotional, Memory, Safety Awareness, Problem Solve, Thought     Visit Diagnosis: Hemiplegia and hemiparesis following cerebral infarction affecting left non-dominant side (HCC) - Plan: Ot plan of care cert/re-cert  Unsteadiness on feet - Plan: Ot plan of care cert/re-cert  Muscle weakness (generalized) - Plan: Ot plan of care cert/re-cert  Cognitive social or emotional deficit following cerebral infarction - Plan: Ot plan of care cert/re-cert  Other lack of coordination - Plan: Ot plan of care cert/re-cert  Other disturbances of skin sensation - Plan: Ot plan of care cert/re-cert    Problem List Patient Active Problem List   Diagnosis Date Noted  . TIA (transient ischemic attack) 08/15/2019  . Long term current use of anticoagulant 08/14/2019  . Cardiomyopathy ([place]) 08/14/2019  . Paroxysmal atrial fibrillation ([place]) 08/06/2019  . Statin intolerance 08/03/2019  . Dysphagia   . History of CVA (cerebrovascular accident) 08/02/2019  . Hypokalemia 08/02/2019  . Left bundle branch block 08/02/2019  . CKD (chronic kidney disease) stage 3, GFR 30-59 ml/min 12/13/2018  . Primary localized osteoarthritis of right knee 02/17/2017  . Primary osteoarthritis of right knee 02/17/2017  . Preventative health care 09/01/2016  . Vitamin B12 deficiency 09/10/2015  . Insomnia 07/16/2015  . GERD (gastroesophageal reflux disease) 03/26/2015  . Fatigue 10/02/2014  . Other malaise and fatigue 02/08/2014  . HLD (hyperlipidemia) 11/19/2011  . Vaginal dryness, menopausal 11/19/2011  . Adjustment disorder 01/08/2011  . Former smoker 12/19/2008  . Essential hypertension 12/19/2008    Mariah Milling, OTR/L 08/22/2019, 1:49 PM  [facility] [address] [place], [place], [address] Phone:  [phone]   Fax:  [phone]  Name: DEKOTA KIRLIN MRN: [id number] Date of Birth: Jun 27, 1950

## 2019-08-22 NOTE — Patient Instructions (Signed)
Get folder for therapy handouts  Small single bites, sips  Don't take another bite or sip until your mouth is empty  No distractions while eating - visitors or TV  Signs of Aspiration Pneumonia   . Chest pain/tightness . Fever (can be low grade) . Cough  o With foul-smelling phlegm (sputum) o With sputum containing pus or blood o With greenish sputum . Fatigue  . Shortness of breath  . Wheezing   **IF YOU HAVE THESE SIGNS, CONTACT YOUR DOCTOR OR GO TO THE EMERGENCY DEPARTMENT OR URGENT CARE AS SOON AS POSSIBLE**

## 2019-08-23 ENCOUNTER — Other Ambulatory Visit: Payer: Self-pay | Admitting: Primary Care

## 2019-08-23 ENCOUNTER — Other Ambulatory Visit (INDEPENDENT_AMBULATORY_CARE_PROVIDER_SITE_OTHER): Payer: Medicare HMO

## 2019-08-23 DIAGNOSIS — R6889 Other general symptoms and signs: Secondary | ICD-10-CM

## 2019-08-23 DIAGNOSIS — I5023 Acute on chronic systolic (congestive) heart failure: Secondary | ICD-10-CM

## 2019-08-23 LAB — BASIC METABOLIC PANEL
BUN: 11 mg/dL (ref 6–23)
CO2: 30 mEq/L (ref 19–32)
Calcium: 9 mg/dL (ref 8.4–10.5)
Chloride: 104 mEq/L (ref 96–112)
Creatinine, Ser: 1.26 mg/dL — ABNORMAL HIGH (ref 0.40–1.20)
GFR: 42.09 mL/min — ABNORMAL LOW (ref >60.00)
Glucose, Bld: 105 mg/dL — ABNORMAL HIGH (ref 70–99)
Potassium: 3.2 mEq/L — ABNORMAL LOW (ref 3.5–5.1)
Sodium: 140 mEq/L (ref 135–145)

## 2019-08-23 LAB — BRAIN NATRIURETIC PEPTIDE: Pro B Natriuretic peptide (BNP): 1019 pg/mL — ABNORMAL HIGH (ref 0.0–100.0)

## 2019-08-23 LAB — MAGNESIUM: Magnesium: 1.8 mg/dL (ref 1.5–2.5)

## 2019-08-23 LAB — VITAMIN B12: Vitamin B-12: 201 pg/mL — ABNORMAL LOW (ref 211–911)

## 2019-08-23 LAB — TSH: TSH: 0.75 u[IU]/mL (ref 0.35–4.50)

## 2019-08-23 NOTE — Therapy (Signed)
[facility] [address] [place], [place], [address] Phone: [phone]   Fax:  [phone]  Physical Therapy Evaluation  Patient Details  Name: [name] MRN: [id number] Date of Birth: December 05, 1950 Referring Provider (PT): Pollie Friar, RN (sending to PCP Alma Friendly)   Encounter Date: 08/22/2019  PT End of Session - 08/23/19 1128    Visit Number  1    Number of Visits  9    Date for PT Re-Evaluation  11/21/19    Authorization Type  [employer]    PT Start Time  1403    PT Stop Time  1444    PT Time Calculation (min)  41 min    Equipment Utilized During Treatment  Gait belt    Activity Tolerance  Patient tolerated treatment well    Behavior During Therapy  [facility] for tasks assessed/performed       Past Medical History:  Diagnosis Date  . Allergy   . Anxiety   . Arnold-Chiari malformation ([facility])   . Arthritis   . CHF (congestive heart failure) ([facility])   . Coronary artery disease   . GERD (gastroesophageal reflux disease)   . H. pylori infection    3 years ago  . H/O cardiac arrest    Hx of VT/Vfib arrest  . Hyperlipidemia   . Incontinence   . Paroxysmal atrial fibrillation ([facility]) 08/06/2019  . Syncope and collapse    Per pt, denies passing out  . Tobacco use disorder   . Unspecified essential hypertension     Past Surgical History:  Procedure Laterality Date  . APPENDECTOMY    . ARNOLD CHIARI SURGERY     neurocranial surgery  . BLEPHAROPLASTY     Bil  . C-EYE SURGERY PROCEDURE    . CARDIAC CATHETERIZATION    . CHOLECYSTECTOMY    . EYE SURGERY    . heart failure    . LEFT HEART CATH AND CORONARY ANGIOGRAPHY N/A 08/04/2019   Procedure: LEFT HEART CATH AND CORONARY ANGIOGRAPHY;  Surgeon: Adrian Prows, MD;  Location: [facility];  Service: Cardiovascular;  Laterality: N/A;  . MASS EXCISION Right 12/27/2013   Procedure: MINOR EXCISION OF RIGHT THUMB MUCOID CYST, DEBRIDEMENT OF  INTERPHALANGEAL JOINT;  Surgeon: Cammie Sickle, MD;  Location: [facility];  Service: Orthopedics;  Laterality: Right;  . mass on thumb  right  . TOTAL KNEE ARTHROPLASTY Right 02/17/2017  . TOTAL KNEE ARTHROPLASTY Right 02/17/2017   Procedure: TOTAL KNEE ARTHROPLASTY;  Surgeon: Melrose Nakayama, MD;  Location: [facility];  Service: Orthopedics;  Laterality: Right;  . TUBAL LIGATION      There were no vitals filed for this visit.   Subjective Assessment - 08/22/19 1407    Subjective  Pt is a 69 y.o. female readmitted 08/14/19 with worsened LUE weakness. 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 On 2/18, pt noted to be in vfib without pulse, shocked and went into torsades and PEA; transfer to ICU; brief intubation 2/18.[name] MRI 08/15/19 showed some extension of recent CVA in R basal ganglia, no new CVA. Pt wearing shock vest now. Things have been going ok - feels like she doesn't have a lot of endurance. Walks in with RW.  Has been walking around the house with no AD. No falls. States that strength and balance has gotten much better. Feels like she is having difficulty gripping with her hands, also having difficulty with sitting  up out of bed. Before she was hospitalized, was not walking with any AD.    Pertinent History  PMH includes anxiety, HTN, Chiari I malformation, a fib, CKD stage III, HLD, hx of R knee replacement, hx of sciatica for several years    How long can you stand comfortably?  approx. 2 hours of doing chores etc. before needing to lie down.    Patient Stated Goals  wants to get back to how she was (wonder around the store, sweep the house), get stronger    Currently in Pain?  No/denies         [facility] PT Assessment - 08/22/19 1414      Assessment   Medical Diagnosis  BG stroke    Referring Provider (PT)  Pollie Friar, RN   sending to PCP Alma Friendly   Onset Date/Surgical Date  08/04/19    Hand Dominance  Right    Next MD Visit  needs  to follow up with neurology      Precautions   Precautions  Fall    Precaution Comments  blood thinners, halter monitor      Balance Screen   Has the patient fallen in the past 6 months  No    Has the patient had a decrease in activity level because of a fear of falling?   Yes    Is the patient reluctant to leave their home because of a fear of falling?   No      Home Film/video editor residence    Living Arrangements  Spouse/significant other    Available Help at Discharge  Family    Type of [place] to enter    Entrance Stairs-Number of Steps  2    Entrance Stairs-Rails  Right;Left   can reach one at a time    [place] - standard;Grab bars - toilet;Grab bars - tub/shower      Prior Function   Level of [place]  Retired    [employer]  retired recently - owned a Human resources officer    Leisure  cooking, Control and instrumentation engineer, spending time with grandson, thrift shopping       Sensation   Light Touch  Appears Intact    Proprioception  Appears Intact      Coordination   Gross Motor Movements are Fluid and Coordinated  Yes      ROM / Strength   AROM / PROM / Strength  Strength      Strength   Strength Assessment Site  Hip;Ankle;Knee    Right/Left Hip  Right;Left    Right Hip Flexion  4+/5    Left Hip Flexion  4/5    Right/Left Knee  Right;Left    Right Knee Flexion  5/5    Right Knee Extension  5/5    Left Knee Flexion  4+/5    Left Knee Extension  4+/5    Right/Left Ankle  Right;Left    Right Ankle Dorsiflexion  5/5    Left Ankle Dorsiflexion  5/5      Transfers   Transfers  Sit to Stand;Stand to Sit    Sit to Stand  5: Supervision;Without upper extremity assist    Five time sit to stand comments   17.79 seconds with no UE support from standard arm chair     Stand  to Sit  Without upper extremity assist;To chair/3-in-1    Comments  30 second chair stand: 8  sit <> stands       Ambulation/Gait   Ambulation/Gait  Yes    Ambulation/Gait Assistance  5: Supervision;4: Min guard    Ambulation Distance (Feet)  115 Feet    Assistive device  None    Gait Pattern  Step-through pattern;Narrow base of support;Decreased weight shift to left;Decreased arm swing - right;Decreased arm swing - left    Ambulation Surface  Level;Indoor    Gait velocity  19 seconds = 1.72 ft/sec      Standardized Balance Assessment   Standardized Balance Assessment  Timed Up and Go Test;Berg Balance Test      Berg Balance Test   Sit to Stand  Able to stand without using hands and stabilize independently    Standing Unsupported  Able to stand safely 2 minutes    Sitting with Back Unsupported but Feet Supported on Floor or Stool  Able to sit safely and securely 2 minutes    Stand to Sit  Sits safely with minimal use of hands    Transfers  Able to transfer safely, minor use of hands    Standing Unsupported with Eyes Closed  Able to stand 10 seconds safely    Standing Unsupported with Feet Together  Able to place feet together independently and stand 1 minute safely    From Standing, Reach Forward with Outstretched Arm  Can reach forward >12 cm safely (5")    From Standing Position, Pick up Object from Floor  Able to pick up shoe safely and easily    From Standing Position, Turn to Look Behind Over each Shoulder  Looks behind from both sides and weight shifts well    Turn 360 Degrees  Able to turn 360 degrees safely but slowly   7 SECONDS TO R, 4.91 seconds to L   Standing Unsupported, Alternately Place Feet on Step/Stool  Able to stand independently and safely and complete 8 steps in 20 seconds    Standing Unsupported, One Foot in Front  Able to plae foot ahead of the other independently and hold 30 seconds    Standing on One Leg  Able to lift leg independently and hold 5-10 seconds   5 seconds on R   Total Score  51    Berg comment:  51/56   moderate risk for falls       Timed Up and Go Test   Normal TUG (seconds)  14.56    TUG Comments  no AD                Objective measurements completed on examination: See above findings.              PT Education - 08/22/19 1544    Education Details  clinical findings, POC    Person(s) Educated  Patient    Methods  Explanation    Comprehension  Verbalized understanding       PT Short Term Goals - 08/23/19 1132      PT SHORT TERM GOAL #1   Title  Pt will be independent with initial HEP in order to build upon functional gains made in PT. ALL STGS DUE 09/20/19    Time  4    Period  Weeks    Target Date  09/20/19      PT SHORT TERM GOAL #2   Title  Pt will undergo further assessment of DGI  to determine fall risk with no AD - LTG to be written as appropriate.    Time  4    Period  Weeks    Status  New      PT SHORT TERM GOAL #3   Title  Pt will improve gait speed to at least 2.0 ft/sec with no AD in order to demo decr fall risk.    Baseline  1.72 ft/sec with no AD    Time  4    Period  Weeks    Status  New      PT SHORT TERM GOAL #4   Title  Pt will perform 5x sit <> stand in 15 seconds or less with no UE support from standard chair in order to demo improved functional LE strength.    Baseline  17.79 seconds on 08/22/19    Time  4    Period  Weeks    Status  New        PT Long Term Goals - 08/23/19 1135      PT LONG TERM GOAL #1   Title  Pt will be independent with final HEP in order to build upon functional gains made in PT.  ALL LTGS DUE 10/18/19    Time  8    Period  Weeks    Status  New    Target Date  10/18/19      PT LONG TERM GOAL #2   Title  DGI goal to be written as appropriate to determine fall risk.    Time  8    Period  Weeks    Status  New      PT LONG TERM GOAL #3   Title  Perform 3MWT vs. 6MWT to determine endurance with gait in order to safely be able to shop/walk around a store.    Time  8    Period  Weeks    Status  New      PT LONG TERM GOAL #4    Title  Pt will perform at least 11 sit <> stands in 30 seconds with no UE support from standard chair in order to demo age related norms and improved functional LE strength.    Time  8    Period  Weeks    Status  New      PT LONG TERM GOAL #5   Title  Pt will improve gait speed with no AD to at least 2.4 ft/sec in order to demo improved gait speed and efficiency.    Time  8    Period  Weeks    Status  New             Plan - 08/23/19 1129    Clinical Impression Statement  Patient is a 69  year old female referred to Neuro OPPT for evaluation s/p recent BG Infarct- 4/85/46 - with complications during hospitaliztion - respratory arrest, on vent in ICU. Patient returned to hospital on 2/28 with TIA like symptoms.  Patient arrived today with halter monitor to assess cardiac status (patient with paroxysmal Afib). Pt's PMH is significant for: recent BG Infarct- 2/70/35 - with complications during hospitaliztion - respratory arrest, on vent in ICU. Patient returned to hospital on 2/28 with TIA like symptoms.  Patient arrived today with halter monitor to assess cardiac status (patient with paroxysmal Afib). The following deficits were present during the exam: gait abnormalities, decreased functional LE strength, decreased endurance, impaired dynamic balance. Pt's BERG, gait speed, and TUG  scores indicate pt is at a moderate/higher risk for falls. Pt would benefit from skilled PT to address these impairments and functional limitations to maximize functional mobility independence and decr fall risk.    Personal Factors and Comorbidities  Comorbidity 3+;Past/Current Experience    Comorbidities  anxiety, HTN, Chiari I malformation, a fib, CKD stage III, HLD    Examination-Activity Limitations  Stairs;Locomotion Level    Stability/Clinical Decision Making  Stable/Uncomplicated    Clinical Decision Making  Low    Rehab Potential  Good    PT Frequency  1x / week    PT Duration  8 weeks    PT  Treatment/Interventions  ADLs/Self Care Home Management;Therapeutic exercise;Therapeutic activities;Functional mobility training;Stair training;Gait training;DME Instruction;Balance training;Neuromuscular re-education;Patient/family education    PT Next Visit Plan  perform DGI and write LTG as appropriate. initial HEP for LE strengthening, balance. gait with no AD. when appropriate perform 3MWT vs. 6MWT to measure endurance.       Patient will benefit from skilled therapeutic intervention in order to improve the following deficits and impairments:  Abnormal gait, Decreased balance, Decreased activity tolerance, Difficulty walking, Decreased strength  Visit Diagnosis: Unsteadiness on feet  Other abnormalities of gait and mobility  Muscle weakness (generalized)     Problem List Patient Active Problem List   Diagnosis Date Noted  . TIA (transient ischemic attack) 08/15/2019  . Long term current use of anticoagulant 08/14/2019  . Cardiomyopathy ([facility]) 08/14/2019  . Paroxysmal atrial fibrillation ([place]) 08/06/2019  . Statin intolerance 08/03/2019  . Dysphagia   . History of CVA (cerebrovascular accident) 08/02/2019  . Hypokalemia 08/02/2019  . Left bundle branch block 08/02/2019  . CKD (chronic kidney disease) stage 3, GFR 30-59 ml/min 12/13/2018  . Primary localized osteoarthritis of right knee 02/17/2017  . Primary osteoarthritis of right knee 02/17/2017  . Preventative health care 09/01/2016  . Vitamin B12 deficiency 09/10/2015  . Insomnia 07/16/2015  . GERD (gastroesophageal reflux disease) 03/26/2015  . Fatigue 10/02/2014  . Other malaise and fatigue 02/08/2014  . HLD (hyperlipidemia) 11/19/2011  . Vaginal dryness, menopausal 11/19/2011  . Adjustment disorder 01/08/2011  . Former smoker 12/19/2008  . Essential hypertension 12/19/2008    Arliss Journey, PT, DPT  08/23/2019, 11:39 AM  [facility] [address] [place], [place], [address] Phone: [phone]   Fax:  [phone]  Name: ARETHA LEVI MRN: [id number] Date of Birth: November 15, 1950

## 2019-08-24 ENCOUNTER — Emergency Department (HOSPITAL_COMMUNITY): Payer: Medicare HMO

## 2019-08-24 ENCOUNTER — Encounter (HOSPITAL_COMMUNITY)
Admission: EM | Disposition: A | Discharge status: Home / Self Care | Payer: Self-pay | Source: Home / Self Care | Attending: Cardiology

## 2019-08-24 ENCOUNTER — Other Ambulatory Visit: Payer: Self-pay

## 2019-08-24 ENCOUNTER — Inpatient Hospital Stay (HOSPITAL_COMMUNITY)
Admission: EM | Admit: 2019-08-24 | Discharge: 2019-08-26 | DRG: 227 | Disposition: A | Discharge status: Home / Self Care | Payer: Medicare HMO | Attending: Cardiology | Admitting: Cardiology

## 2019-08-24 DIAGNOSIS — Z95818 Presence of other cardiac implants and grafts: Secondary | ICD-10-CM

## 2019-08-24 DIAGNOSIS — Z8673 Personal history of transient ischemic attack (TIA), and cerebral infarction without residual deficits: Secondary | ICD-10-CM | POA: Diagnosis not present

## 2019-08-24 DIAGNOSIS — I420 Dilated cardiomyopathy: Secondary | ICD-10-CM | POA: Diagnosis not present

## 2019-08-24 DIAGNOSIS — I469 Cardiac arrest, cause unspecified: Secondary | ICD-10-CM | POA: Diagnosis present

## 2019-08-24 DIAGNOSIS — I480 Paroxysmal atrial fibrillation: Secondary | ICD-10-CM | POA: Diagnosis not present

## 2019-08-24 DIAGNOSIS — Z87891 Personal history of nicotine dependence: Secondary | ICD-10-CM

## 2019-08-24 DIAGNOSIS — N183 Chronic kidney disease, stage 3 unspecified: Secondary | ICD-10-CM | POA: Diagnosis present

## 2019-08-24 DIAGNOSIS — Z881 Allergy status to other antibiotic agents status: Secondary | ICD-10-CM

## 2019-08-24 DIAGNOSIS — M1990 Unspecified osteoarthritis, unspecified site: Secondary | ICD-10-CM | POA: Diagnosis present

## 2019-08-24 DIAGNOSIS — E876 Hypokalemia: Secondary | ICD-10-CM | POA: Diagnosis present

## 2019-08-24 DIAGNOSIS — R911 Solitary pulmonary nodule: Secondary | ICD-10-CM | POA: Diagnosis not present

## 2019-08-24 DIAGNOSIS — Z95810 Presence of automatic (implantable) cardiac defibrillator: Secondary | ICD-10-CM

## 2019-08-24 DIAGNOSIS — Z7982 Long term (current) use of aspirin: Secondary | ICD-10-CM

## 2019-08-24 DIAGNOSIS — I509 Heart failure, unspecified: Secondary | ICD-10-CM | POA: Diagnosis present

## 2019-08-24 DIAGNOSIS — I10 Essential (primary) hypertension: Secondary | ICD-10-CM | POA: Diagnosis not present

## 2019-08-24 DIAGNOSIS — R0902 Hypoxemia: Secondary | ICD-10-CM | POA: Diagnosis not present

## 2019-08-24 DIAGNOSIS — Z91040 Latex allergy status: Secondary | ICD-10-CM | POA: Diagnosis not present

## 2019-08-24 DIAGNOSIS — I428 Other cardiomyopathies: Secondary | ICD-10-CM | POA: Diagnosis not present

## 2019-08-24 DIAGNOSIS — Z882 Allergy status to sulfonamides status: Secondary | ICD-10-CM

## 2019-08-24 DIAGNOSIS — Z8249 Family history of ischemic heart disease and other diseases of the circulatory system: Secondary | ICD-10-CM

## 2019-08-24 DIAGNOSIS — Z8674 Personal history of sudden cardiac arrest: Secondary | ICD-10-CM

## 2019-08-24 DIAGNOSIS — Z888 Allergy status to other drugs, medicaments and biological substances status: Secondary | ICD-10-CM

## 2019-08-24 DIAGNOSIS — Z7901 Long term (current) use of anticoagulants: Secondary | ICD-10-CM | POA: Diagnosis not present

## 2019-08-24 DIAGNOSIS — I429 Cardiomyopathy, unspecified: Secondary | ICD-10-CM

## 2019-08-24 DIAGNOSIS — Z91010 Allergy to peanuts: Secondary | ICD-10-CM

## 2019-08-24 DIAGNOSIS — I472 Ventricular tachycardia: Secondary | ICD-10-CM | POA: Diagnosis present

## 2019-08-24 DIAGNOSIS — R110 Nausea: Secondary | ICD-10-CM | POA: Diagnosis not present

## 2019-08-24 DIAGNOSIS — Z79899 Other long term (current) drug therapy: Secondary | ICD-10-CM

## 2019-08-24 DIAGNOSIS — Z20822 Contact with and (suspected) exposure to covid-19: Secondary | ICD-10-CM | POA: Diagnosis present

## 2019-08-24 DIAGNOSIS — Z91013 Allergy to seafood: Secondary | ICD-10-CM

## 2019-08-24 DIAGNOSIS — I130 Hypertensive heart and chronic kidney disease with heart failure and stage 1 through stage 4 chronic kidney disease, or unspecified chronic kidney disease: Secondary | ICD-10-CM | POA: Diagnosis not present

## 2019-08-24 DIAGNOSIS — I5022 Chronic systolic (congestive) heart failure: Secondary | ICD-10-CM | POA: Diagnosis not present

## 2019-08-24 DIAGNOSIS — I4901 Ventricular fibrillation: Principal | ICD-10-CM | POA: Diagnosis present

## 2019-08-24 DIAGNOSIS — E782 Mixed hyperlipidemia: Secondary | ICD-10-CM | POA: Diagnosis present

## 2019-08-24 DIAGNOSIS — K219 Gastro-esophageal reflux disease without esophagitis: Secondary | ICD-10-CM | POA: Diagnosis present

## 2019-08-24 DIAGNOSIS — I447 Left bundle-branch block, unspecified: Secondary | ICD-10-CM | POA: Diagnosis not present

## 2019-08-24 DIAGNOSIS — I2510 Atherosclerotic heart disease of native coronary artery without angina pectoris: Secondary | ICD-10-CM | POA: Diagnosis present

## 2019-08-24 DIAGNOSIS — G459 Transient cerebral ischemic attack, unspecified: Secondary | ICD-10-CM | POA: Diagnosis not present

## 2019-08-24 DIAGNOSIS — E785 Hyperlipidemia, unspecified: Secondary | ICD-10-CM | POA: Diagnosis present

## 2019-08-24 DIAGNOSIS — Z885 Allergy status to narcotic agent status: Secondary | ICD-10-CM

## 2019-08-24 DIAGNOSIS — Z03818 Encounter for observation for suspected exposure to other biological agents ruled out: Secondary | ICD-10-CM | POA: Diagnosis not present

## 2019-08-24 LAB — CBC WITH DIFFERENTIAL/PLATELET
Abs Immature Granulocytes: 0.01 10*3/uL (ref 0.00–0.07)
Basophils Absolute: 0.1 10*3/uL (ref 0.0–0.1)
Basophils Relative: 1 %
Eosinophils Absolute: 0.3 10*3/uL (ref 0.0–0.5)
Eosinophils Relative: 5 %
HCT: 36.6 % (ref 36.0–46.0)
Hemoglobin: 12 g/dL (ref 12.0–15.0)
Immature Granulocytes: 0 %
Lymphocytes Relative: 20 %
Lymphs Abs: 1.1 10*3/uL (ref 0.7–4.0)
MCH: 29.5 pg (ref 26.0–34.0)
MCHC: 32.8 g/dL (ref 30.0–36.0)
MCV: 89.9 fL (ref 80.0–100.0)
Monocytes Absolute: 0.3 10*3/uL (ref 0.1–1.0)
Monocytes Relative: 6 %
Neutro Abs: 3.8 10*3/uL (ref 1.7–7.7)
Neutrophils Relative %: 68 %
Platelets: 219 10*3/uL (ref 150–400)
RBC: 4.07 MIL/uL (ref 3.87–5.11)
RDW: 13.2 % (ref 11.5–15.5)
WBC: 5.6 10*3/uL (ref 4.0–10.5)
nRBC: 0 % (ref 0.0–0.2)

## 2019-08-24 LAB — BASIC METABOLIC PANEL
Anion gap: 11 (ref 5–15)
Anion gap: 9 (ref 5–15)
Anion gap: 8 (ref 5–15)
BUN: 8 mg/dL (ref 8–23)
BUN: 8 mg/dL (ref 8–23)
BUN: 8 mg/dL (ref 8–23)
CO2: 23 mmol/L (ref 22–32)
CO2: 25 mmol/L (ref 22–32)
CO2: 25 mmol/L (ref 22–32)
Calcium: 7.9 mg/dL — ABNORMAL LOW (ref 8.9–10.3)
Calcium: 8.3 mg/dL — ABNORMAL LOW (ref 8.9–10.3)
Calcium: 8.5 mg/dL — ABNORMAL LOW (ref 8.9–10.3)
Chloride: 109 mmol/L (ref 98–111)
Chloride: 106 mmol/L (ref 98–111)
Chloride: 108 mmol/L (ref 98–111)
Creatinine, Ser: 0.99 mg/dL (ref 0.44–1.00)
Creatinine, Ser: 1.17 mg/dL — ABNORMAL HIGH (ref 0.44–1.00)
Creatinine, Ser: 1.18 mg/dL — ABNORMAL HIGH (ref 0.44–1.00)
GFR calc Af Amer: >60 mL/min (ref >60)
GFR calc Af Amer: 55 mL/min — ABNORMAL LOW (ref >60)
GFR calc Af Amer: 54 mL/min — ABNORMAL LOW (ref >60)
GFR calc non Af Amer: 58 mL/min — ABNORMAL LOW (ref >60)
GFR calc non Af Amer: 48 mL/min — ABNORMAL LOW (ref >60)
GFR calc non Af Amer: 47 mL/min — ABNORMAL LOW (ref >60)
Glucose, Bld: 101 mg/dL — ABNORMAL HIGH (ref 70–99)
Glucose, Bld: 131 mg/dL — ABNORMAL HIGH (ref 70–99)
Glucose, Bld: 157 mg/dL — ABNORMAL HIGH (ref 70–99)
Potassium: 3.1 mmol/L — ABNORMAL LOW (ref 3.5–5.1)
Potassium: 4.1 mmol/L (ref 3.5–5.1)
Potassium: 3.3 mmol/L — ABNORMAL LOW (ref 3.5–5.1)
Sodium: 143 mmol/L (ref 135–145)
Sodium: 140 mmol/L (ref 135–145)
Sodium: 141 mmol/L (ref 135–145)

## 2019-08-24 LAB — I-STAT CHEM 8, ED
BUN: 8 mg/dL (ref 8–23)
Calcium, Ion: 1 mmol/L — ABNORMAL LOW (ref 1.15–1.40)
Chloride: 108 mmol/L (ref 98–111)
Creatinine, Ser: 0.8 mg/dL (ref 0.44–1.00)
Glucose, Bld: 96 mg/dL (ref 70–99)
HCT: 31 % — ABNORMAL LOW (ref 36.0–46.0)
Hemoglobin: 10.5 g/dL — ABNORMAL LOW (ref 12.0–15.0)
Potassium: 2.7 mmol/L — CL (ref 3.5–5.1)
Sodium: 145 mmol/L (ref 135–145)
TCO2: 24 mmol/L (ref 22–32)

## 2019-08-24 LAB — TSH: TSH: 0.55 u[IU]/mL (ref 0.350–4.500)

## 2019-08-24 LAB — APTT: aPTT: 39 seconds — ABNORMAL HIGH (ref 24–36)

## 2019-08-24 LAB — BRAIN NATRIURETIC PEPTIDE: B Natriuretic Peptide: 617.4 pg/mL — ABNORMAL HIGH (ref 0.0–100.0)

## 2019-08-24 LAB — HEPARIN LEVEL (UNFRACTIONATED): Heparin Unfractionated: >2.2 IU/mL — ABNORMAL HIGH (ref 0.30–0.70)

## 2019-08-24 LAB — MAGNESIUM
Magnesium: 2.8 mg/dL — ABNORMAL HIGH (ref 1.7–2.4)
Magnesium: 2.3 mg/dL (ref 1.7–2.4)
Magnesium: 2.1 mg/dL (ref 1.7–2.4)

## 2019-08-24 LAB — SARS CORONAVIRUS 2 (TAT 6-24 HRS): SARS Coronavirus 2: NEGATIVE

## 2019-08-24 SURGERY — INVASIVE LAB ABORTED CASE

## 2019-08-24 MED ORDER — MAGNESIUM SULFATE 2 GM/50ML IV SOLN
2.0000 g | Freq: Once | INTRAVENOUS | Status: AC
  Administered 2019-08-24: 2 g via INTRAVENOUS
  Filled 2019-08-24: qty 50

## 2019-08-24 MED ORDER — SODIUM CHLORIDE 0.9 % IV SOLN
80.0000 mg | INTRAVENOUS | Status: AC
  Filled 2019-08-24: qty 2

## 2019-08-24 MED ORDER — HEPARIN (PORCINE) IN NACL 1000-0.9 UT/500ML-% IV SOLN
INTRAVENOUS | Status: AC
  Filled 2019-08-24: qty 500

## 2019-08-24 MED ORDER — POTASSIUM CHLORIDE CRYS ER 20 MEQ PO TBCR
40.0000 meq | EXTENDED_RELEASE_TABLET | Freq: Once | ORAL | Status: AC
  Administered 2019-08-24: 40 meq via ORAL
  Filled 2019-08-24: qty 2

## 2019-08-24 MED ORDER — SODIUM CHLORIDE 0.9 % IV SOLN
INTRAVENOUS | Status: AC
  Filled 2019-08-24: qty 2

## 2019-08-24 MED ORDER — PANTOPRAZOLE SODIUM 40 MG PO TBEC
40.0000 mg | DELAYED_RELEASE_TABLET | Freq: Every day | ORAL | Status: DC
  Administered 2019-08-24 – 2019-08-25 (×2): 40 mg via ORAL
  Filled 2019-08-24 (×2): qty 1

## 2019-08-24 MED ORDER — NICOTINE POLACRILEX 2 MG MT GUM
4.0000 mg | CHEWING_GUM | OROMUCOSAL | Status: DC | PRN
  Filled 2019-08-24: qty 2

## 2019-08-24 MED ORDER — CARVEDILOL 6.25 MG PO TABS
6.2500 mg | ORAL_TABLET | Freq: Two times a day (BID) | ORAL | Status: DC
  Administered 2019-08-25 – 2019-08-26 (×2): 6.25 mg via ORAL
  Filled 2019-08-24: qty 2
  Filled 2019-08-24: qty 1

## 2019-08-24 MED ORDER — AMIODARONE HCL IN DEXTROSE 360-4.14 MG/200ML-% IV SOLN
60.0000 mg/h | INTRAVENOUS | Status: AC
  Administered 2019-08-24 (×2): 60 mg/h via INTRAVENOUS
  Filled 2019-08-24: qty 200

## 2019-08-24 MED ORDER — SPIRONOLACTONE 12.5 MG HALF TABLET
12.5000 mg | ORAL_TABLET | Freq: Every day | ORAL | Status: DC
  Administered 2019-08-24 – 2019-08-26 (×3): 12.5 mg via ORAL
  Filled 2019-08-24 (×3): qty 1

## 2019-08-24 MED ORDER — DIPHENHYDRAMINE HCL 25 MG PO CAPS
25.0000 mg | ORAL_CAPSULE | Freq: Once | ORAL | Status: AC
  Administered 2019-08-24: 25 mg via ORAL
  Filled 2019-08-24: qty 1

## 2019-08-24 MED ORDER — DOCUSATE SODIUM 100 MG PO CAPS: 100.0000 mg | ORAL_CAPSULE | Freq: Every day | ORAL | Status: DC | PRN

## 2019-08-24 MED ORDER — AMIODARONE HCL IN DEXTROSE 360-4.14 MG/200ML-% IV SOLN
30.0000 mg/h | INTRAVENOUS | Status: DC
  Administered 2019-08-24 – 2019-08-25 (×3): 30 mg/h via INTRAVENOUS
  Filled 2019-08-24 (×4): qty 200

## 2019-08-24 MED ORDER — POTASSIUM CHLORIDE 10 MEQ/100ML IV SOLN
10.0000 meq | INTRAVENOUS | Status: AC
  Administered 2019-08-24 (×2): 10 meq via INTRAVENOUS
  Filled 2019-08-24 (×2): qty 100

## 2019-08-24 MED ORDER — LIDOCAINE HCL 1 % IJ SOLN
INTRAMUSCULAR | Status: AC
  Filled 2019-08-24: qty 60

## 2019-08-24 MED ORDER — ROSUVASTATIN CALCIUM 5 MG PO TABS
10.0000 mg | ORAL_TABLET | Freq: Every day | ORAL | Status: DC
  Administered 2019-08-24 – 2019-08-25 (×2): 10 mg via ORAL
  Filled 2019-08-24 (×2): qty 2

## 2019-08-24 MED ORDER — ASPIRIN EC 81 MG PO TBEC
81.0000 mg | DELAYED_RELEASE_TABLET | Freq: Every day | ORAL | Status: DC
  Administered 2019-08-24 – 2019-08-26 (×3): 81 mg via ORAL
  Filled 2019-08-24 (×3): qty 1

## 2019-08-24 MED ORDER — LORATADINE 10 MG PO TABS
10.0000 mg | ORAL_TABLET | Freq: Every day | ORAL | Status: DC | PRN
  Filled 2019-08-24: qty 1

## 2019-08-24 MED ORDER — AMIODARONE LOAD VIA INFUSION
150.0000 mg | Freq: Once | INTRAVENOUS | Status: AC
  Administered 2019-08-24: 150 mg via INTRAVENOUS
  Filled 2019-08-24: qty 83.34

## 2019-08-24 MED ORDER — EZETIMIBE 10 MG PO TABS
10.0000 mg | ORAL_TABLET | Freq: Every day | ORAL | Status: DC
  Administered 2019-08-24 – 2019-08-26 (×3): 10 mg via ORAL
  Filled 2019-08-24 (×3): qty 1

## 2019-08-24 MED ORDER — CEFAZOLIN SODIUM-DEXTROSE 2-4 GM/100ML-% IV SOLN
INTRAVENOUS | Status: AC
  Filled 2019-08-24: qty 100

## 2019-08-24 MED ORDER — SACUBITRIL-VALSARTAN 49-51 MG PO TABS
1.0000 | ORAL_TABLET | Freq: Two times a day (BID) | ORAL | Status: DC
  Administered 2019-08-24 – 2019-08-26 (×5): 1 via ORAL
  Filled 2019-08-24 (×6): qty 1

## 2019-08-24 MED ORDER — NICOTINE 21 MG/24HR TD PT24
21.0000 mg | MEDICATED_PATCH | TRANSDERMAL | Status: DC
  Administered 2019-08-24 – 2019-08-26 (×3): 21 mg via TRANSDERMAL
  Filled 2019-08-24 (×3): qty 1

## 2019-08-24 MED ORDER — HEPARIN (PORCINE) 25000 UT/250ML-% IV SOLN
1000.0000 [IU]/h | INTRAVENOUS | Status: DC
  Administered 2019-08-24: 1000 [IU]/h via INTRAVENOUS
  Filled 2019-08-24: qty 250

## 2019-08-24 MED ORDER — SODIUM CHLORIDE 0.9 % IV SOLN: INTRAVENOUS | Status: DC

## 2019-08-24 MED ORDER — CEFAZOLIN SODIUM-DEXTROSE 2-4 GM/100ML-% IV SOLN
2.0000 g | INTRAVENOUS | Status: DC
  Filled 2019-08-24: qty 100

## 2019-08-24 SURGICAL SUPPLY — 3 items
CABLE SURGICAL S-101-97-12 (CABLE) ×2 IMPLANT
PAD PRO RADIOLUCENT 2001M-C (PAD) ×2 IMPLANT
TRAY PACEMAKER INSERTION (PACKS) ×2 IMPLANT

## 2019-08-24 NOTE — H&P (Addendum)
CARDIOLOGY ADMIT NOTE   Patient ID: [name] MRN: [id number] DOB/AGE: 1950/10/29 69 y.o.  Admit date: 08/24/2019 Primary Physician:  Pleas Koch, NP  Patient ID: [name], female    DOB: 06-19-1950, 69 y.o.   MRN: [id number]  Chief Complaint  Patient presents with  . AICD Problem   HPI:    [name]  is a 69 y.o. who presents to the hospital with chief complaint of "life vest went off." Patient's past medical history and cardiovascular risk factors include hypertension, hyperlipidemia, former tobacco use, history of VT/VF, cardiac arrest, torsades the point, nonischemic cardiomyopathy, peripheral vascular disease, premature fibrillation, left bundle branch block, history of TIA/CVA.  Patient states that she was not feeling well yesterday and went to sleep on the couch. Approximately 1 AM the patient felt that the gel from the LifeVest was on her chest and felt the LifeVest shocked her. Both the patient and husband were concerned to call 911 and patient was brought to the hospital for further evaluation.  Earlier this morning, LifeVest was called to see what the underlying rhythm was and if therapy was appropriate or inappropriate. The pulmonary report was that patient did have a tachyarrhythmia which was treated appropriately. Later it was noted that the patient did have a V. fib arrest and they were faxing over the tracing to [facility], ER.   At the time of the evaluation patient was hemodynamically stable and able to provide her own HPI. Work-up thus far has noted hypokalemia which is currently being replaced with IV potassium. Magnesium is within normal limits.  During her last hospitalization patient was admitted for TIA/CVA symptoms. She subsequently developed torsades which transitioned to VT/VF arrest. She was successfully resuscitated. Echocardiogram which noted reduced left ventricular systolic function with an estimated LVEF of 25-30%, global hypokinesis,  paradoxical septal motion secondary to left bundle branch block. She also underwent a left heart catheterization and per report patient's findings and with nonischemic cardiomyopathy. She also had atrial fibrillation on telemetry and was started on oral anticoagulation for thromboembolic prophylaxis given her CHA2DS2-VASc SCORE. LifeVest was placed and patient was discharged home. I had seen her in the office on August 12, 2019 she was doing well on guideline directed medical therapy without uptitrating medications as her kidney function has still not come back to baseline. She had a follow-up appointment later this week on Friday with labs prior to that as well. In the interim she now presents to the hospital after a V. fib arrest.  Patient denies any chest pain, shortness of breath, lightheadedness, dizziness, palpitations, orthopnea, paroxysmal nocturnal dyspnea, lower extremity swelling, no syncope or near syncope. Patient denies hematochezia, hemoptysis, melanotic stools, no new focal neurological deficits.  Past Medical History:  Diagnosis Date  . Allergy   . Anxiety   . Arnold-Chiari malformation ([place])   . Arthritis   . CHF (congestive heart failure) ([place])   . Coronary artery disease   . GERD (gastroesophageal reflux disease)   . H. pylori infection    3 years ago  . H/O cardiac arrest    Hx of VT/Vfib arrest  . Hyperlipidemia   . Incontinence   . Paroxysmal atrial fibrillation ([place]) 08/06/2019  . Syncope and collapse    Per pt, denies passing out  . Tobacco use disorder   . Unspecified essential hypertension    Past Surgical History:  Procedure Laterality Date  . APPENDECTOMY    . ARNOLD CHIARI SURGERY  neurocranial surgery  . BLEPHAROPLASTY     Bil  . C-EYE SURGERY PROCEDURE    . CARDIAC CATHETERIZATION    . CHOLECYSTECTOMY    . EYE SURGERY    . heart failure    . LEFT HEART CATH AND CORONARY ANGIOGRAPHY N/A 08/04/2019   Procedure: LEFT HEART CATH AND CORONARY  ANGIOGRAPHY;  Surgeon: Adrian Prows, MD;  Location: [facility];  Service: Cardiovascular;  Laterality: N/A;  . MASS EXCISION Right 12/27/2013   Procedure: MINOR EXCISION OF RIGHT THUMB MUCOID CYST, DEBRIDEMENT OF INTERPHALANGEAL JOINT;  Surgeon: Cammie Sickle, MD;  Location: [facility];  Service: Orthopedics;  Laterality: Right;  . mass on thumb  right  . TOTAL KNEE ARTHROPLASTY Right 02/17/2017  . TOTAL KNEE ARTHROPLASTY Right 02/17/2017   Procedure: TOTAL KNEE ARTHROPLASTY;  Surgeon: Melrose Nakayama, MD;  Location: [facility];  Service: Orthopedics;  Laterality: Right;  . TUBAL LIGATION     Social History   Socioeconomic History  . Marital status: Married    Spouse name: Not on file  . Number of children: 2  . Years of education: Not on file  . Highest education level: Not on file  Occupational History  . Occupation: hair dresser  Tobacco Use  . Smoking status: Former Smoker    Packs/day: 1.00    Years: 50.00    Pack years: 50.00    Types: Cigarettes    Quit date: 08/08/2019    Years since quitting: 0.0  . Smokeless tobacco: Never Used  Substance and Sexual Activity  . Alcohol use: No    Alcohol/week: 0.0 standard drinks  . Drug use: No  . Sexual activity: Not on file  Other Topics Concern  . Not on file  Social History Narrative  . Not on file   Social Determinants of Health   Financial Resource Strain:   . Difficulty of Paying Living Expenses: Not on file  Food Insecurity:   . Worried About Charity fundraiser in the Last Year: Not on file  . Ran Out of Food in the Last Year: Not on file  Transportation Needs:   . Lack of Transportation (Medical): Not on file  . Lack of Transportation (Non-Medical): Not on file  Physical Activity:   . Days of Exercise per Week: Not on file  . Minutes of Exercise per Session: Not on file  Stress:   . Feeling of Stress : Not on file  Social Connections:   . Frequency of Communication with Friends and Family:  Not on file  . Frequency of Social Gatherings with Friends and Family: Not on file  . Attends Religious Services: Not on file  . Active Member of Clubs or Organizations: Not on file  . Attends Archivist Meetings: Not on file  . Marital Status: Not on file  Intimate Partner Violence:   . Fear of Current or Ex-Partner: Not on file  . Emotionally Abused: Not on file  . Physically Abused: Not on file  . Sexually Abused: Not on file   ROS  14 ORGAN REVIEW OF SYSTEMS: CONSTITUTIONAL: No fever or significant weight loss EYES: No recent significant visual change EARS, NOSE, MOUTH, THROAT: No recent significant change in hearing CARDIOVASCULAR: See discussion in subjective/HPI RESPIRATORY: See discussion in subjective/HPI GASTROINTESTINAL: No recent complaints of abdominal pain GENITOURINARY: No recent significant change in genitourinary status MUSCULOSKELETAL: No recent significant change in musculoskeletal status INTEGUMENTARY: No recent rash NEUROLOGIC: No recent significant change in  motor function PSYCHIATRIC: No recent significant change in mood ENDOCRINOLOGIC: No recent significant change in endocrine status HEMATOLOGIC/LYMPHATIC: No recent significant unexpected bruising ALLERGIC/IMMUNOLOGIC: No recent unexplained allergic reaction  Objective   Vitals with BMI 08/24/2019 08/24/2019 08/24/2019  Height - - -  Weight - - -  BMI - - -  Systolic 703 500 938  Diastolic 45 53 53  Pulse 56 63 59    CONSTITUTIONAL: Appears older than stated age, no acute distress, hemodynamically stable.   SKIN: Skin is warm and dry. No rash noted. No cyanosis. No pallor. No jaundice HEAD: Normocephalic and atraumatic.  EYES: No scleral icterus MOUTH/THROAT: Moist oral membranes.  NECK: No JVD present. No thyromegaly noted.   LYMPHATIC: No visible cervical adenopathy.  CHEST Normal respiratory effort. No intercostal retractions.   Defibrillator pads currently placed anterior and  posteriorly. LUNGS: Clear to auscultation bilaterally.  No stridor. No wheezes. No rales.  CARDIOVASCULAR: Regular, positive H8-E9, soft holosystolic murmur heard at the apex radiating to axilla, no gallops or rubs appreciated ABDOMINAL: Nonobese, soft, nontender, nondistended, positive bowel sounds in all 4 quadrants no apparent ascites.  EXTREMITIES: No peripheral edema. 2+ right femoral pulse, 1+ left femoral pulse, none palpable bilateral popliteal, dorsalis pedis or posterior tibial pulses.  Warm to touch bilaterally.  No discoloration or cyanosis present. HEMATOLOGIC: No significant bruising NEUROLOGIC: Oriented to person, place, and time. Nonfocal. Normal muscle tone.  PSYCHIATRIC: Normal mood and affect. Normal behavior. Cooperative  Laboratory examination:   Recent Labs    08/14/19 2227 08/14/19 2234 08/15/19 0748 08/15/19 0748 08/23/19 1315 08/24/19 0535 08/24/19 0637  NA 142   < > 143   < > 140 145 143  K 3.6   < > 3.5   < > 3.2* 2.7* 3.1*  CL 107   < > 109   < > 104 108 109  CO2 22   < > 24  --  30  --  23  GLUCOSE 111*   < > 81   < > 105* 96 101*  BUN 10   < > 8   < > 11 8 8   CREATININE 1.22*   < > 1.06*   < > 1.26* 0.80 0.99  CALCIUM 8.6*   < > 8.4*  --  9.0  --  7.9*  GFRNONAA 45*  --  54*  --   --   --  58*  GFRAA 52*  --  >60  --   --   --  >60   < > = values in this interval not displayed.   estimated creatinine clearance is 52.2 mL/min (by C-G formula based on SCr of 0.99 mg/dL).  CMP Latest Ref Rng & Units 08/24/2019 08/24/2019 08/23/2019  Glucose 70 - 99 mg/dL 101(H) 96 105(H)  BUN 8 - 23 mg/dL 8 8 11   Creatinine 0.44 - 1.00 mg/dL 0.99 0.80 1.26(H)  Sodium 135 - 145 mmol/L 143 145 140  Potassium 3.5 - 5.1 mmol/L 3.1(L) 2.7(LL) 3.2(L)  Chloride 98 - 111 mmol/L 109 108 104  CO2 22 - 32 mmol/L 23 - 30  Calcium 8.9 - 10.3 mg/dL 7.9(L) - 9.0  Total Protein 6.5 - 8.1 g/dL - - -  Total Bilirubin 0.3 - 1.2 mg/dL - - -  Alkaline Phos 38 - 126 U/L - - -  AST 15 -  41 U/L - - -  ALT 0 - 44 U/L - - -   CBC Latest Ref Rng & Units 08/24/2019 08/24/2019  08/15/2019  WBC 4.0 - 10.5 K/uL - 5.6 7.8  Hemoglobin 12.0 - 15.0 g/dL 10.5(L) 12.0 12.4  Hematocrit 36.0 - 46.0 % 31.0(L) 36.6 37.8  Platelets 150 - 400 K/uL - 219 273    Lipid Panel     Component Value Date/Time   CHOL 232 (H) 08/03/2019 0249   TRIG 128 08/03/2019 0249   HDL 31 (L) 08/03/2019 0249   CHOLHDL 7.5 08/03/2019 0249   VLDL 26 08/03/2019 0249   LDLCALC 175 (H) 08/03/2019 0249   LDLDIRECT 193.0 09/02/2016 0839    HEMOGLOBIN A1C Lab Results  Component Value Date   HGBA1C 5.7 (H) 08/03/2019   MPG 116.89 08/03/2019    TSH Recent Labs    08/23/19 1315  TSH 0.75    BNP (last 3 results) Recent Labs    08/24/19 0512  BNP 617.4*    Medications and allergies   Allergies  Allergen Reactions  . Shellfish-Derived Products Anaphylaxis  . Ativan [Lorazepam] Other (See Comments)    Makes "skin crawl" and insomnia  . Buspar [Buspirone] Nausea Only    Sweating and dizzy  . Clarithromycin Swelling  . Codeine Nausea And Vomiting  . Doxycycline Other (See Comments)    Unknown  . Guaifenesin Other (See Comments)    Palpitations  . Oxycodone Nausea And Vomiting  . Prednisone Nausea And Vomiting and Other (See Comments)    Makes my heart race  . Statins Other (See Comments)    Muscle pain  . Sulfonamide Derivatives Nausea And Vomiting    Achiness  . Tetracycline Nausea Only  . Vicodin [Hydrocodone-Acetaminophen] Nausea And Vomiting  . Famotidine Rash  . Latex Rash  . Omeprazole Nausea Only    Cough, shortness of breath - patient doesn't remember  . Peanut-Containing Drug Products Itching and Rash  . Wellbutrin [Bupropion] Palpitations     Prior to Admission medications   Medication Sig Start Date End Date Taking? Authorizing Provider  acetaminophen (TYLENOL) 160 MG/5ML elixir Take 500 mg by mouth every 4 (four) hours as needed for fever.   Yes [provider]   acetaminophen (TYLENOL) 500 MG tablet Take 1,000 mg by mouth 2 (two) times daily as needed (pain).   Yes [provider]  apixaban (ELIQUIS) 5 MG TABS tablet Take 1 tablet (5 mg total) by mouth 2 (two) times daily. 08/07/19 09/06/19 Yes Alma Friendly, MD  aspirin 81 MG chewable tablet Chew 1 tablet (81 mg total) by mouth daily. 08/08/19  Yes Alma Friendly, MD  carvedilol (COREG) 6.25 MG tablet Take 1 tablet (6.25 mg total) by mouth 2 (two) times daily with a meal. 08/07/19 09/06/19 Yes Alma Friendly, MD  docusate sodium (COLACE) 100 MG capsule Take 100 mg by mouth daily as needed for mild constipation.   Yes [provider]  ezetimibe (ZETIA) 10 MG tablet Take 1 tablet (10 mg total) by mouth daily. 08/08/19 09/07/19 Yes Alma Friendly, MD  loratadine (CLARITIN) 10 MG tablet Take 10 mg by mouth daily as needed for allergies.    Yes [provider]  nicotine (NICODERM CQ - DOSED IN MG/24 HOURS) 21 mg/24hr patch Place 1 patch (21 mg total) onto the skin daily. 08/07/19 09/06/19 Yes Alma Friendly, MD  nicotine polacrilex (NICORETTE) 4 MG gum Take 1 each (4 mg total) by mouth as needed for smoking cessation. 08/07/19 09/06/19 Yes Alma Friendly, MD  pantoprazole (PROTONIX) 40 MG tablet Take 1 tablet (40 mg total) by mouth daily  at 12 noon. 08/07/19 09/06/19 Yes Alma Friendly, MD  rosuvastatin (CRESTOR) 10 MG tablet Take 1 tablet (10 mg total) by mouth daily at 6 PM. 08/07/19 09/06/19 Yes Alma Friendly, MD  sacubitril-valsartan (ENTRESTO) 49-51 MG Take 1 tablet by mouth 2 (two) times daily. 08/07/19 09/06/19 Yes Alma Friendly, MD  spironolactone (ALDACTONE) 25 MG tablet Take 0.5 tablets (12.5 mg total) by mouth daily. 08/08/19 09/07/19 Yes Alma Friendly, MD    . amiodarone     Followed by  . amiodarone      Current Outpatient Medications  Medication Instructions  . acetaminophen (TYLENOL) 1,000 mg, Oral, 2 times daily PRN   . acetaminophen (TYLENOL) 500 mg, Oral, Every 4 hours PRN  . apixaban (ELIQUIS) 5 mg, Oral, 2 times daily  . aspirin 81 mg, Oral, Daily  . carvedilol (COREG) 6.25 mg, Oral, 2 times daily with meals  . docusate sodium (COLACE) 100 mg, Oral, Daily PRN  . ezetimibe (ZETIA) 10 mg, Oral, Daily  . loratadine (CLARITIN) 10 mg, Oral, Daily PRN  . nicotine (NICODERM CQ - DOSED IN MG/24 HOURS) 21 mg, Transdermal, Every 24 hours  . nicotine polacrilex (NICORETTE) 4 mg, Oral, As needed  . pantoprazole (PROTONIX) 40 mg, Oral, Daily  . rosuvastatin (CRESTOR) 10 mg, Oral, Daily-1800  . sacubitril-valsartan (ENTRESTO) 49-51 MG 1 tablet, Oral, 2 times daily  . spironolactone (ALDACTONE) 12.5 mg, Oral, Daily    No intake/output data recorded.  Total I/O In: 200 [IV [id number]] Out: -    Radiology:  Chest x-ray 08/24/2019: No acute abnormality of the lungs in AP portable projection.  Cardiac Studies:   EKG: 08/12/2019: Normal sinus rhythm with ventricular rate of 61 bpm, left axis deviation, left bundle branch block, nonspecific T wave abnormalities.  Prior EKG dated 08/04/2019 shows a normal sinus rhythm, left axis deviation, left bundle branch block 08/24/2019: Normal sinus rhythm ventricular rate of 61 bpm, left bundle branch block, ST-T changes most likely secondary to underlying LBBB.   Echocardiogram: 08/03/2019: LVEF 25-30%, severely reduced left ventricular systolic function, global hypokinesis, moderate concentric left ventricular hypertrophy, mild MR, 08/04/2019: LVEF 25-30%, severely reduced LV function, global hypokinesis, paradoxical septal motion secondary to LBBB.   Heart Catheterization: 08/04/19: LV: Global hypokinesis, upper limit of normal size, EF 25 to 30%. Left main: Normal. LAD: Mild diffuse disease. Proximal LAD has a 30 to 40% stenosis, mid segment has a 20 to 30% stenosis, scattered disease noted in the LAD. Brisk flow. Circumflex: Again scattered disease noted in  the circumflex. Mid segment has at most a 30 to 40% stenosis which appears to be eccentric and calcified. RCA: Dominant. Mild diffuse disease again noted. Mid segment after the origin of RV branch has a 70 to 80% stenosis. Brisk flow is evident throughout the RCA. Impression: Findings consistent with nonischemic cardiomyopathy. Although she has significant disease in the right coronary artery, this does not explain her presentation with global hypokinesis, neither does it explain VF arrest as the lesion does not appear to be unstable.   Carotid duplex: 08/03/2019: Right Carotid: Velocities in the right ICA are consistent with a 1-39% stenosis. Left Carotid: Velocities in the left ICA are consistent with a 1-39%  stenosis. Vertebrals: Bilateral vertebral arteries demonstrate antegrade flow. Subclavians: Normal flow hemodynamics were seen in bilateral subclavian arteries.  Assessment   Status post cardiac arrest secondary to ventricular fibrillation. Hypokalemia Chronic heart failure with reduced EF, stage C, NYHA class II Nonischemic cardiomyopathy History of VT/V. fib  arrest. History of torsades de pointes History of CVA/TIA. Left bundle branch block. Atrial fibrillation, paroxysmal On long-term oral anticoagulation Mixed hyperlipidemia. Benign essential hypertension with chronic kidney disease stage III. Former smoker.  Recommendations:  Status post cardiac arrest secondary to ventricular fibrillation:  Patient is currently hemodynamically stable resting comfortably. Laboratory values illustrates hypokalemia which is currently being replaced parenterally. Recheck potassium and magnesium.  Spoke to the LifeVest representative to see if the defibrillation was appropriate. The underlying rhythm according to the rapid is noted to be ventricular fibrillation. I have asked the lab to fax her a copy to verify this and it is currently being faxed.  Continue telemetry  Aggressive  replacement of electrolytes with a potassium of 4 and magnesium of 2.  Continue beta-blocker therapy.  May consider amiodarone if patient continues to have nonsustained VT or symptomatic events, until seen by EP.  We will consult electrophysiology for their recommendations given her history of torsades de pointes and VT/V. fib arrest during her last hospitalization and now presenting with V. fib arrest. She may be a candidate for BiV ICD given her underlying left bundle branch block. We will await their recommendations.  Chronic heart failure with reduced EF, stage C NYHA class II:  BNP is mildly elevated but patient does not complain of any heart failure symptoms and chest x-ray does not report any vascular congestion.  Continue current heart failure medications which can be uptitrated based on her hemodynamics and laboratory values.  Nonischemic cardiomyopathy: See above  Paroxysmal atrial fibrillation:  Currently patient has normal sinus rhythm.  We will hold Eliquis during her hospitalization and transition to IV heparin for possible procedure if deemed appropriate per EP service.  Continue beta-blocker therapy for rate control.  Long-term oral anticoagulation:  Patient does not endorse any evidence of bleeding at this time. Continue to monitor.  Benign essential hypertension with chronic kidney disease stage III:  Continue current antihypertensive medications and continue to follow.  CRITICAL CARE Performed by: Rex Kras   Total critical care time: 35 minutes   Critical care time was exclusive of separately billable procedures and treating other patients.   Critical care was necessary to treat or prevent imminent or life-threatening deterioration.   Critical care was time spent personally by me on the following activities: development of treatment plan with patient and/or surrogate as well as nursing, discussions with consultants, evaluation of patient's response to  treatment, examination of patient, obtaining history from patient or surrogate, ordering and performing treatments and interventions, ordering and review of laboratory studies, ordering and review of radiographic studies, pulse oximetry and re-evaluation of patient's condition.  Alexis Reber [place], DO, [facility] 08/24/2019, 9:13 AM [facility]. [place] Office: [phone]

## 2019-08-24 NOTE — Progress Notes (Signed)
Patient went to cath lab in her bed for ICD implantation,her consent was signed husband at bedside aware of above agreeable with procedure. Cath lab reported would do on call ABt's in cath lab. I held her coreg due to bradycardia at times rate was 55 at time of administration alerted Consulting civil engineer. She conts with Amiodarone drip at 16.7 ml/hr did not stop this drip as patient did not sustain in the 50's she came up. Writer notified CCMD patient left floor for procedure.

## 2019-08-24 NOTE — ED Triage Notes (Signed)
Patient from home brought in by [facility] because of defib vest firing at 0345. C/O nausea, EMS administered zofran 4 mg. Recently seen here for CVA and cardiac arrest per EMS. VS 156/70, 61 HR, 90's RA.

## 2019-08-24 NOTE — Progress Notes (Addendum)
Notified MD regarding HR ranging from 58-62 and to advise about continuation of amiodarone drip.  Advised to continue amiodarone at this time.

## 2019-08-24 NOTE — ED Notes (Signed)
Attempted report 

## 2019-08-24 NOTE — ED Notes (Signed)
Gave patient some water patient is resting with call bell in rerach

## 2019-08-24 NOTE — ED Notes (Signed)
Wrote PA RE antibiotics.

## 2019-08-24 NOTE — ED Provider Notes (Addendum)
[facility] EMERGENCY DEPARTMENT Provider Note   CSN: [id number] Arrival date & time: 08/24/19  0456     History Chief Complaint  Patient presents with  . AICD Problem    [name] is a 69 y.o. female.  HPI     This is a 68 year old female with a history of dilated cardiomyopathy, coronary artery disease, in-hospital cardiac arrest torsades/V. fib status post LifeVest placement, paroxysmal atrial fibrillation, CVA who presents with her LifeVest firing.  Patient reports that she was sleeping at home.  She woke up and got up for a few minutes and then went back to bed.  She was about to go to sleep when she was alarmed by her LifeVest that it fired.  She states that at that time she felt nauseated.  No chest pain, shortness of breath, abdominal pain, syncope.  She currently feels at her baseline.  No recent fevers.  Admission on 2/16 for CVA.  At that time had an in-hospital arrest on 2/18.  Subsequent cardiac catheterization with stable RCA lesion.  Reduced EF.  Thought to be nonischemic cardiomyopathy.  Discharged with a LifeVest.  She had a readmission on 2/28 for a TIA.  Past Medical History:  Diagnosis Date  . Allergy   . Anxiety   . Arnold-Chiari malformation ([place])   . Arthritis   . CHF (congestive heart failure) ([place])   . Coronary artery disease   . GERD (gastroesophageal reflux disease)   . H. pylori infection    3 years ago  . H/O cardiac arrest    Hx of VT/Vfib arrest  . Hyperlipidemia   . Incontinence   . Paroxysmal atrial fibrillation ([place]) 08/06/2019  . Syncope and collapse    Per pt, denies passing out  . Tobacco use disorder   . Unspecified essential hypertension     Patient Active Problem List   Diagnosis Date Noted  . TIA (transient ischemic attack) 08/15/2019  . Long term current use of anticoagulant 08/14/2019  . Cardiomyopathy ([place]) 08/14/2019  . Paroxysmal atrial fibrillation ([place]) 08/06/2019  . Statin intolerance 08/03/2019   . Dysphagia   . History of CVA (cerebrovascular accident) 08/02/2019  . Hypokalemia 08/02/2019  . Left bundle branch block 08/02/2019  . CKD (chronic kidney disease) stage 3, GFR 30-59 ml/min 12/13/2018  . Primary localized osteoarthritis of right knee 02/17/2017  . Primary osteoarthritis of right knee 02/17/2017  . Preventative health care 09/01/2016  . Vitamin B12 deficiency 09/10/2015  . Insomnia 07/16/2015  . GERD (gastroesophageal reflux disease) 03/26/2015  . Fatigue 10/02/2014  . Other malaise and fatigue 02/08/2014  . HLD (hyperlipidemia) 11/19/2011  . Vaginal dryness, menopausal 11/19/2011  . Adjustment disorder 01/08/2011  . Former smoker 12/19/2008  . Essential hypertension 12/19/2008    Past Surgical History:  Procedure Laterality Date  . APPENDECTOMY    . ARNOLD CHIARI SURGERY     neurocranial surgery  . BLEPHAROPLASTY     Bil  . C-EYE SURGERY PROCEDURE    . CARDIAC CATHETERIZATION    . CHOLECYSTECTOMY    . EYE SURGERY    . heart failure    . LEFT HEART CATH AND CORONARY ANGIOGRAPHY N/A 08/04/2019   Procedure: LEFT HEART CATH AND CORONARY ANGIOGRAPHY;  Surgeon: Adrian Prows, MD;  Location: [place] CV [facility];  Service: Cardiovascular;  Laterality: N/A;  . MASS EXCISION Right 12/27/2013   Procedure: MINOR EXCISION OF RIGHT THUMB MUCOID CYST, DEBRIDEMENT OF INTERPHALANGEAL JOINT;  Surgeon: Cammie Sickle,  MD;  Location: [place];  Service: Orthopedics;  Laterality: Right;  . mass on thumb  right  . TOTAL KNEE ARTHROPLASTY Right 02/17/2017  . TOTAL KNEE ARTHROPLASTY Right 02/17/2017   Procedure: TOTAL KNEE ARTHROPLASTY;  Surgeon: Melrose Nakayama, MD;  Location: [place];  Service: Orthopedics;  Laterality: Right;  . TUBAL LIGATION       OB History   No obstetric history on file.     Family History  Problem Relation Age of Onset  . Bone cancer Mother   . Heart disease Mother   . Cancer Sister        metastatic; unknown primary  .  Diverticulosis Sister   . Tuberculosis Father   . Diabetes Sister   . Hypertension Sister   . Colon cancer Neg Hx   . Esophageal cancer Neg Hx   . Pancreatic cancer Neg Hx   . Stomach cancer Neg Hx   . Liver disease Neg Hx   . Kidney disease Neg Hx   . Rectal cancer Neg Hx     Social History   Tobacco Use  . Smoking status: Former Smoker    Packs/day: 1.00    Years: 50.00    Pack years: 50.00    Types: Cigarettes    Quit date: 08/08/2019    Years since quitting: 0.0  . Smokeless tobacco: Never Used  Substance Use Topics  . Alcohol use: No    Alcohol/week: 0.0 standard drinks  . Drug use: No    Home Medications Prior to Admission medications   Medication Sig Start Date End Date Taking? Authorizing Provider  acetaminophen (TYLENOL) 160 MG/5ML elixir Take 500 mg by mouth every 4 (four) hours as needed for fever.   Yes [provider]  acetaminophen (TYLENOL) 500 MG tablet Take 1,000 mg by mouth 2 (two) times daily as needed (pain).   Yes [provider]  apixaban (ELIQUIS) 5 MG TABS tablet Take 1 tablet (5 mg total) by mouth 2 (two) times daily. 08/07/19 09/06/19 Yes Alma Friendly, MD  aspirin 81 MG chewable tablet Chew 1 tablet (81 mg total) by mouth daily. 08/08/19  Yes Alma Friendly, MD  carvedilol (COREG) 6.25 MG tablet Take 1 tablet (6.25 mg total) by mouth 2 (two) times daily with a meal. 08/07/19 09/06/19 Yes Alma Friendly, MD  docusate sodium (COLACE) 100 MG capsule Take 100 mg by mouth daily as needed for mild constipation.   Yes [provider]  ezetimibe (ZETIA) 10 MG tablet Take 1 tablet (10 mg total) by mouth daily. 08/08/19 09/07/19 Yes Alma Friendly, MD  loratadine (CLARITIN) 10 MG tablet Take 10 mg by mouth daily as needed for allergies.    Yes [provider]  nicotine (NICODERM CQ - DOSED IN MG/24 HOURS) 21 mg/24hr patch Place 1 patch (21 mg total) onto the skin daily. 08/07/19 09/06/19 Yes Alma Friendly, MD  nicotine polacrilex (NICORETTE) 4 MG gum Take 1 each (4 mg total) by mouth as needed for smoking cessation. 08/07/19 09/06/19 Yes Alma Friendly, MD  pantoprazole (PROTONIX) 40 MG tablet Take 1 tablet (40 mg total) by mouth daily at 12 noon. 08/07/19 09/06/19 Yes Alma Friendly, MD  rosuvastatin (CRESTOR) 10 MG tablet Take 1 tablet (10 mg total) by mouth daily at 6 PM. 08/07/19 09/06/19 Yes Alma Friendly, MD  sacubitril-valsartan (ENTRESTO) 49-51 MG Take 1 tablet by mouth 2 (two) times daily. 08/07/19 09/06/19 Yes Alma Friendly, MD  spironolactone (ALDACTONE) 25 MG tablet Take 0.5 tablets (12.5 mg total) by mouth daily. 08/08/19 09/07/19 Yes Alma Friendly, MD    Allergies    Shellfish-derived products, Ativan [lorazepam], Buspar [buspirone], Clarithromycin, Codeine, Doxycycline, Guaifenesin, Oxycodone, Prednisone, Statins, Sulfonamide derivatives, Tetracycline, Vicodin [hydrocodone-acetaminophen], Famotidine, Latex, Omeprazole, Peanut-containing drug products, and Wellbutrin [bupropion]  Review of Systems   Review of Systems  Constitutional: Negative for fever.  Respiratory: Negative for shortness of breath.   Cardiovascular: Negative for chest pain.  Gastrointestinal: Positive for nausea. Negative for abdominal pain and vomiting.  Genitourinary: Negative for dysuria.  All other systems reviewed and are negative.   Physical Exam Updated Vital Signs BP (!) 136/58   Pulse (!) 54   Temp 98.2 F (36.8 C) (Oral)   Resp 17   Ht 1.626 m (5\' 4" )   Wt 72.3 kg   SpO2 97%   BMI 27.36 kg/m   Physical Exam Vitals and nursing note reviewed.  Constitutional:      Appearance: She is well-developed. She is not ill-appearing.  HENT:     Head: Normocephalic and atraumatic.     Mouth/Throat:     Mouth: Mucous membranes are moist.  Eyes:     Pupils: Pupils are equal, round, and reactive to light.  Cardiovascular:     Rate and Rhythm: Normal rate and  regular rhythm.     Heart sounds: Normal heart sounds.     Comments: LifeVest on, blue gel noted to have saturated the vest Pulmonary:     Effort: Pulmonary effort is normal. No respiratory distress.     Breath sounds: No wheezing.  Abdominal:     General: Bowel sounds are normal.     Palpations: Abdomen is soft.     Tenderness: There is no abdominal tenderness.  Musculoskeletal:     Cervical back: Neck supple.     Right lower leg: No edema.     Left lower leg: No edema.  Skin:    General: Skin is warm and dry.  Neurological:     Mental Status: She is alert and oriented to person, place, and time.  Psychiatric:        Mood and Affect: Mood normal.     ED Results / Procedures / Treatments   Labs (all labs ordered are listed, but only abnormal results are displayed) Labs Reviewed  BRAIN NATRIURETIC PEPTIDE - Abnormal; Notable for the following components:      Result Value   B Natriuretic Peptide 617.4 (*)    All other components within normal limits  I-STAT CHEM 8, ED - Abnormal; Notable for the following components:   Potassium 2.7 (*)    Calcium, Ion 1.00 (*)    Hemoglobin 10.5 (*)    HCT 31.0 (*)    All other components within normal limits  CBC WITH DIFFERENTIAL/PLATELET  BASIC METABOLIC PANEL  MAGNESIUM    EKG EKG Interpretation  Date/Time:  Wednesday August 24 2019 04:59:25 EST Ventricular Rate:  61 PR Interval:    QRS Duration: 172 QT Interval:  534 QTC Calculation: 538 R Axis:   -52 Text Interpretation: Sinus rhythm Left bundle branch block Baseline wander in lead(s) V2 No significant change since last tracing Confirmed by Thayer Jew [phone]) on 08/24/2019 5:09:07 AM   Radiology No results found.  Procedures Procedures (including critical care time)  Medications Ordered in ED Medications  potassium chloride 10 mEq in 100 mL IVPB (10 mEq Intravenous New Bag/Given 08/24/19 0731)  magnesium sulfate IVPB 2  g 50 mL (0 g Intravenous Stopped 08/24/19  0624)  potassium chloride SA (KLOR-CON) CR tablet 40 mEq (40 mEq Oral Given 08/24/19 0551)    ED Course  I have reviewed the triage vital signs and the nursing notes.  Pertinent labs & imaging results that were available during my care of the patient were reviewed by me and considered in my medical decision making (see chart for details).  Clinical Course as of Aug 24 750  Wed Aug 24, 2019  0702 Spoke with patient's cardiologist.  Rozetta Nunnery with plan.  Awaiting ZOLL transmission.  Reported tachyarrhythmia.  Patient currently getting magnesium and potassium infusion.   [CH]  0703 Zoll Tech:  [phone]   [CH]    Clinical Course User Index [CH] Kanden Carey, Barbette Hair, MD   MDM Rules/Calculators/A&P                       Patient presents after her ZOLL LifeVest triggered.  She only had nausea during the event.  She is currently nontoxic-appearing and vital signs are largely reassuring.  EKG is nonischemic and without evidence of arrhythmia.  She is not having any active chest pain or shortness of breath.  It does appear that the LifeVest triggered.  Battery pack was brought to the bedside and we were able to transmit the information.  Awaiting fax.  Initial lab work notable for potassium of 2.7.  This was replaced as well as magnesium.  Discussed with her cardiologist who will evaluate later in the morning.    Final Clinical Impression(s) / ED Diagnoses Final diagnoses:  None    Rx / DC Orders ED Discharge Orders    None       Lorelie Biermann, Barbette Hair, MD 08/24/19 0737    Merryl Hacker, MD 08/24/19 [phone]

## 2019-08-24 NOTE — Therapy (Signed)
[facility] [address] [place], [place], [address] Phone: [phone]   Fax:  [phone]  Speech Language Pathology Evaluation  Patient Details  Name: [name] MRN: [id number] Date of Birth: Aug 21, 1950 Referring Provider (SLP): Dr. Geradine Girt   Encounter Date: 08/22/2019  End of Session - 08/24/19 0949    Visit Number  1    Number of Visits  9    Date for SLP Re-Evaluation  10/19/19    SLP Start Time  6712    SLP Stop Time   1357    SLP Time Calculation (min)  40 min    Activity Tolerance  Patient tolerated treatment well       Past Medical History:  Diagnosis Date  . Allergy   . Anxiety   . Arnold-Chiari malformation ([place])   . Arthritis   . CHF (congestive heart failure) ([place])   . Coronary artery disease   . GERD (gastroesophageal reflux disease)   . H. pylori infection    3 years ago  . H/O cardiac arrest    Hx of VT/Vfib arrest  . Hyperlipidemia   . Incontinence   . Paroxysmal atrial fibrillation ([place]) 08/06/2019  . Syncope and collapse    Per pt, denies passing out  . Tobacco use disorder   . Unspecified essential hypertension     Past Surgical History:  Procedure Laterality Date  . APPENDECTOMY    . ARNOLD CHIARI SURGERY     neurocranial surgery  . BLEPHAROPLASTY     Bil  . C-EYE SURGERY PROCEDURE    . CARDIAC CATHETERIZATION    . CHOLECYSTECTOMY    . EYE SURGERY    . heart failure    . LEFT HEART CATH AND CORONARY ANGIOGRAPHY N/A 08/04/2019   Procedure: LEFT HEART CATH AND CORONARY ANGIOGRAPHY;  Surgeon: Adrian Prows, MD;  Location: [place] [facility];  Service: Cardiovascular;  Laterality: N/A;  . MASS EXCISION Right 12/27/2013   Procedure: MINOR EXCISION OF RIGHT THUMB MUCOID CYST, DEBRIDEMENT OF INTERPHALANGEAL JOINT;  Surgeon: Cammie Sickle, MD;  Location: [facility] [place];  Service: Orthopedics;  Laterality: Right;  . mass on thumb  right  . TOTAL KNEE  ARTHROPLASTY Right 02/17/2017  . TOTAL KNEE ARTHROPLASTY Right 02/17/2017   Procedure: TOTAL KNEE ARTHROPLASTY;  Surgeon: Melrose Nakayama, MD;  Location: [place];  Service: Orthopedics;  Laterality: Right;  . TUBAL LIGATION      There were no vitals filed for this visit.  Subjective Assessment - 08/24/19 0944    Subjective  My memory is a little off    Currently in Pain?  No/denies         SLP Evaluation [facility] - 08/24/19 0944      SLP Visit Information   SLP Received On  08/22/19    Referring Provider (SLP)  Dr. Geradine Girt    Onset Date  08/02/19    Medical Diagnosis  R basal ganglia CVA      Subjective   Patient/Family Stated Goal  To get stronger      General Information   HPI  [name] is a 69 y.o. female with history of recent stroke with left-sided weakness and A. fib during that admission patient also had a cardiac arrest had several LifeVest started experiencing some sore throat-like feeling and difficulty with word finding.  Pt completed a MBS on 2/19 which revealed aspiration of thin and nectar-thick consistencies.  SLP  recommended Dys 3 solids and honey-thick liquids at that time.  MRI on 08/15/19 reported: "Acute/subacute infarction changes again demonstrated within the right basal ganglia and radiating white matter tracts. Infarction changes along the posterior aspect of the infarction territory have slightly increased in extent from MRI 08/02/2019."     Mobility Status  walks with walker per family request      Balance Screen   Has the patient fallen in the past 6 months  No      Prior Functional Status   Cognitive/Linguistic Baseline  Baseline deficits    Baseline deficit details  STM and executive functioning     Type of Home  House     Lives With  Spouse    Available Support  Family    Vocation  Retired      Associate Professor   Overall Cognitive Status  Impaired/Different from baseline    Area of Impairment  Memory;Attention    Current Attention Level   Selective    Memory  Decreased short-term memory      Auditory Comprehension   Overall Auditory Comprehension  Appears within functional limits for tasks assessed      Verbal Expression   Overall Verbal Expression  Appears within functional limits for tasks assessed      Written Expression   Dominant Hand  Right    Written Expression  Not tested      Oral Motor/Sensory Function   Overall Oral Motor/Sensory Function  Appears within functional limits for tasks assessed      Motor Speech   Overall Motor Speech  Appears within functional limits for tasks assessed      Standardized Assessments   Standardized Assessments   Cognitive Linguistic Quick Test      Cognitive Linguistic Quick Test (Ages 18-69)   Attention  WNL    Memory  Mild    Executive Function  WNL    Language  WNL    Visuospatial Skills  Mild    Severity Rating Total  18    Composite Severity Rating  15.6                      SLP Education - 08/24/19 0948    Education Details  areas of impairment; goals for ST    Person(s) Educated  Patient    Methods  Explanation;Verbal cues    Comprehension  Verbal cues required;Need further instruction         SLP Long Term Goals - 08/24/19 1005      SLP LONG TERM GOAL #1   Title  Pt will report successful financial management (bill paying, balancing, forgetting no bills) with compensations for attention and memory with mod I over 2 sessions    Time  8    Period  Weeks    Status  New      SLP LONG TERM GOAL #2   Title  Pt will utilize 2 compensations for memory and attention to successfully bake 2 items with mod I    Time  8    Period  Weeks    Status  New      SLP LONG TERM GOAL #3   Title  Pt will follow swallow precautions with mod I over 2 sessions and report following them at home.    Time  8    Period  Weeks    Status  New      SLP LONG TERM GOAL #4   Title  Pt/family will demonstrate awareness of s/s of aspiration precautions with mod I     Time  8    Period  Weeks    Status  New       Plan - 08/24/19 8453    Clinical Impression Statement  [name] is referred for outpt speech therapy due to cognitive communication impairments s/p CVA 08/02/19 and TIA 08/14/19. [name] reports some chanes in her memory and attention since CVA. She balanced her checking account and felt unsure, havingn to repeatedly go back and check her accuracy, losing track while balancing. She reports "I haave to make myself pay more attention." [name] enjoys baking and grocery shopping when "its for fun," as well as thrift shopping. She does report success with light cooking, however her family has asked her not to use the kitchen unless they are with her.  MBSS 08/05/19 recommended Dysphagia 3 diet and honey thick liquids. [name] has advanced her liquids to thin at home. She reports coughing with meals. The Cognitive Linguistic Quick Test (CLQT) was administered and revealed mild memory and visuospatial impairments. She presents to day with mild congitive linguistic impairment and mild to moderate oropharyngeal dysphagia. PO trial of thin liquid today revealed no overt s/s of aspiration, however trials limited due to time. I recommend short course of skilled ST to maximize cognition for indpendence in IADL's and training to maximize safety of swallow.    Speech Therapy Frequency  1x /week    Duration  --   8 weeks or  8 visits   Treatment/Interventions  Aspiration precaution training;Diet toleration management by SLP;Trials of upgraded texture/liquids;Internal/external aids;Patient/family education;Compensatory strategies;Functional tasks;Cognitive reorganization;Compensatory techniques;Pharyngeal strengthening exercises;SLP instruction and feedback;Environmental controls;Other (comment)   repeat MBSS - ST will order if indicated      Patient will benefit from skilled therapeutic intervention in order to improve the following deficits and  impairments:   Cognitive communication deficit  Dysphagia, oropharyngeal phase    Problem List Patient Active Problem List   Diagnosis Date Noted  . Cardiac arrest with ventricular fibrillation ([place]) 08/24/2019  . TIA (transient ischemic attack) 08/15/2019  . Long term current use of anticoagulant 08/14/2019  . Cardiomyopathy ([place]) 08/14/2019  . Paroxysmal atrial fibrillation ([place]) 08/06/2019  . Statin intolerance 08/03/2019  . Dysphagia   . History of CVA (cerebrovascular accident) 08/02/2019  . Hypokalemia 08/02/2019  . Left bundle branch block 08/02/2019  . CKD (chronic kidney disease) stage 3, GFR 30-59 ml/min 12/13/2018  . Primary localized osteoarthritis of right knee 02/17/2017  . Primary osteoarthritis of right knee 02/17/2017  . Preventative health care 09/01/2016  . Vitamin B12 deficiency 09/10/2015  . Insomnia 07/16/2015  . GERD (gastroesophageal reflux disease) 03/26/2015  . Fatigue 10/02/2014  . Other malaise and fatigue 02/08/2014  . HLD (hyperlipidemia) 11/19/2011  . Vaginal dryness, menopausal 11/19/2011  . Adjustment disorder 01/08/2011  . Former smoker 12/19/2008  . Essential hypertension 12/19/2008    Cody Albus, Annye Rusk MS, [place] 08/24/2019, 10:07 AM  [facility] [address] [place], [place], [address] Phone: [phone]   Fax:  [phone]  Name: [name] MRN: [id number] Date of Birth: Jan 26, 1951

## 2019-08-24 NOTE — Progress Notes (Signed)
Was called by the ER physician Dr. Dina Rich to evaluate the patient regarding possible cardiac arrest.  Changes seen evaluate the patient at approximately 7:50 AM in room 15 in ER.  Patient is resting comfortably and hemodynamically stable.  Patient states that she was not feeling well all day yesterday was sleeping on the couch when she woke up shortly after midnight she noticed gel over her chest.  In the meantime the patient's husband also heard alarms and shock that was delivered via the [place].  I spoke to the LifeVest rep this morning and appears to the patient did have a V. fib arrest at approximately 1 AM.  She is in the process of sending the strips over for review.  Patient was brought to the hospital via EMS and was found to have hypokalemia electrolytes are currently being replaced.  Patient will need admission and EP consult.  Full H&P to follow.  Rex Kras, DO, [facility]. [place] Office: [phone]

## 2019-08-24 NOTE — ED Notes (Signed)
Called [facility] to Dr Penne Lash

## 2019-08-24 NOTE — Progress Notes (Signed)
Patient back from cath lab they did not do procedure opted for BV ICD implantation in am. EP MD came to bedside with patient wanted placed on zoll monitor and attached to pads in case she needed to be shocked overnight. The crashcart was placed at bedside. Husband was at bedside was aware. EP MD also wanted to cont Amio drip at current rate. MD paged for diet orders.

## 2019-08-24 NOTE — ED Notes (Signed)
Removed life vest and replaced with zoll pads

## 2019-08-24 NOTE — Progress Notes (Signed)
ANTICOAGULATION CONSULT NOTE - Initial Consult  Pharmacy Consult for heparin Indication: atrial fibrillation  Allergies  Allergen Reactions  . Shellfish-Derived Products Anaphylaxis  . Ativan [Lorazepam] Other (See Comments)    Makes "skin crawl" and insomnia  . Buspar [Buspirone] Nausea Only    Sweating and dizzy  . Clarithromycin Swelling  . Codeine Nausea And Vomiting  . Doxycycline Other (See Comments)    Unknown  . Guaifenesin Other (See Comments)    Palpitations  . Oxycodone Nausea And Vomiting  . Prednisone Nausea And Vomiting and Other (See Comments)    Makes my heart race  . Statins Other (See Comments)    Muscle pain  . Sulfonamide Derivatives Nausea And Vomiting    Achiness  . Tetracycline Nausea Only  . Vicodin [Hydrocodone-Acetaminophen] Nausea And Vomiting  . Famotidine Rash  . Latex Rash  . Omeprazole Nausea Only    Cough, shortness of breath - patient doesn't remember  . Peanut-Containing Drug Products Itching and Rash  . Wellbutrin [Bupropion] Palpitations    Patient Measurements: Height: 5\' 4"  (162.6 cm) Weight: 159 lb 6.3 oz (72.3 kg) IBW/kg (Calculated) : 54.7 Heparin Dosing Weight: 70kg  Vital Signs: Temp: 98.2 F (36.8 C) (03/10 0501) Temp Source: Oral (03/10 0501) BP: 130/45 (03/10 0900) Pulse Rate: 56 (03/10 0900)  Labs: Recent Labs    08/23/19 1315 08/24/19 0512 08/24/19 0535 08/24/19 0637  HGB  --  12.0 10.5*  --   HCT  --  36.6 31.0*  --   PLT  --  219  --   --   CREATININE 1.26*  --  0.80 0.99    Estimated Creatinine Clearance: 52.2 mL/min (by C-G formula based on SCr of 0.99 mg/dL).   Medical History: Past Medical History:  Diagnosis Date  . Allergy   . Anxiety   . Arnold-Chiari malformation ([place])   . Arthritis   . CHF (congestive heart failure) ([place])   . Coronary artery disease   . GERD (gastroesophageal reflux disease)   . H. pylori infection    3 years ago  . H/O cardiac arrest    Hx of VT/Vfib arrest  .  Hyperlipidemia   . Incontinence   . Paroxysmal atrial fibrillation ([place]) 08/06/2019  . Syncope and collapse    Per pt, denies passing out  . Tobacco use disorder   . Unspecified essential hypertension     Assessment: [name] presenting to ED after LifeVest shocks , on Eliquis PTA for afib, last dose 3/9.    Goal of Therapy:  Heparin level 0.3-0.7 units/ml aPTT 66-102 seconds Monitor platelets by anticoagulation protocol: Yes   Plan:  Start heparin gtt at 1000 units/hr, no bolus F/u 6 hour aPTT  Bertis Ruddy, PharmD Clinical Pharmacist Please check AMION for all [employer] numbers 08/24/2019 9:23 AM

## 2019-08-24 NOTE — Consult Note (Addendum)
ELECTROPHYSIOLOGY CONSULT NOTE    Patient ID: [name] MRN: [id number], DOB/AGE: [id number] y.o.  Admit date: 08/24/2019 Date of Consult: 08/24/2019  Primary Physician: Pleas Koch, NP Primary Cardiologist: Dr. Einar Gip Electrophysiologist: Dr. Curt Bears   Referring Provider: Dr. Einar Gip  Patient Profile: [name] is a 69 y.o. female with a history of CVA, VT/VF arrest, torsades, NICM, PVD, LBBB HLD, and HTN who is being seen today for the evaluation of lifevest discharge/VF at the request of Dr. Einar Gip.  HPI:  [name] is a 69 y.o. female with medical history as above. She had a complicated admission in 07/2019 with CVA and VF arrest.  Decision made for medical management with new NICM. HF medications have been titrated as tolerated as an outpatient.  Last night she got up around 1 am to get a glass of water, she sat down on the sofa and started feeling very nauseated. Then her lifevest started alarming and shocked her. Reviewed telemetry shows VF/Torsades and appears to be appropriate therapy. She called 911 as instructed and was brought to ER.  Pertinent labs on admission include K 2.7, Cr 0.80, Mg 2.8.   She is currently feeling OK, but anxious about having been shocked and worried about her the effect on her emotional state.  She currently denies any chest pain, SOB, lightheadedness, palpitations, orthopnea, or PND. She denies diarrhea, nausea/vomiting, or having missed any of her medications.   Past Medical History:  Diagnosis Date  . Allergy   . Anxiety   . Arnold-Chiari malformation ([place])   . Arthritis   . CHF (congestive heart failure) ([place])   . Coronary artery disease   . GERD (gastroesophageal reflux disease)   . H. pylori infection    3 years ago  . H/O cardiac arrest    Hx of VT/Vfib arrest  . Hyperlipidemia   . Incontinence   . Paroxysmal atrial fibrillation ([place]) 08/06/2019  . Syncope and collapse    Per pt, denies passing out  . Tobacco use  disorder   . Unspecified essential hypertension      Surgical History:  Past Surgical History:  Procedure Laterality Date  . APPENDECTOMY    . ARNOLD CHIARI SURGERY     neurocranial surgery  . BLEPHAROPLASTY     Bil  . C-EYE SURGERY PROCEDURE    . CARDIAC CATHETERIZATION    . CHOLECYSTECTOMY    . EYE SURGERY    . heart failure    . LEFT HEART CATH AND CORONARY ANGIOGRAPHY N/A 08/04/2019   Procedure: LEFT HEART CATH AND CORONARY ANGIOGRAPHY;  Surgeon: Adrian Prows, MD;  Location: [facility];  Service: Cardiovascular;  Laterality: N/A;  . MASS EXCISION Right 12/27/2013   Procedure: MINOR EXCISION OF RIGHT THUMB MUCOID CYST, DEBRIDEMENT OF INTERPHALANGEAL JOINT;  Surgeon: Cammie Sickle, MD;  Location: [facility];  Service: Orthopedics;  Laterality: Right;  . mass on thumb  right  . TOTAL KNEE ARTHROPLASTY Right 02/17/2017  . TOTAL KNEE ARTHROPLASTY Right 02/17/2017   Procedure: TOTAL KNEE ARTHROPLASTY;  Surgeon: Melrose Nakayama, MD;  Location: [facility];  Service: Orthopedics;  Laterality: Right;  . TUBAL LIGATION       (Not in a hospital admission)   Inpatient Medications:   Allergies:  Allergies  Allergen Reactions  . Shellfish-Derived Products Anaphylaxis  . Ativan [Lorazepam] Other (See Comments)    Makes "skin crawl" and insomnia  . Buspar [Buspirone] Nausea Only  Sweating and dizzy  . Clarithromycin Swelling  . Codeine Nausea And Vomiting  . Doxycycline Other (See Comments)    Unknown  . Guaifenesin Other (See Comments)    Palpitations  . Oxycodone Nausea And Vomiting  . Prednisone Nausea And Vomiting and Other (See Comments)    Makes my heart race  . Statins Other (See Comments)    Muscle pain  . Sulfonamide Derivatives Nausea And Vomiting    Achiness  . Tetracycline Nausea Only  . Vicodin [Hydrocodone-Acetaminophen] Nausea And Vomiting  . Famotidine Rash  . Latex Rash  . Omeprazole Nausea Only    Cough, shortness of breath - patient  doesn't remember  . Peanut-Containing Drug Products Itching and Rash  . Wellbutrin [Bupropion] Palpitations    Social History   Socioeconomic History  . Marital status: Married    Spouse name: Not on file  . Number of children: 2  . Years of education: Not on file  . Highest education level: Not on file  Occupational History  . Occupation: hair dresser  Tobacco Use  . Smoking status: Former Smoker    Packs/day: 1.00    Years: 50.00    Pack years: 50.00    Types: Cigarettes    Quit date: 08/08/2019    Years since quitting: 0.0  . Smokeless tobacco: Never Used  Substance and Sexual Activity  . Alcohol use: No    Alcohol/week: 0.0 standard drinks  . Drug use: No  . Sexual activity: Not on file  Other Topics Concern  . Not on file  Social History Narrative  . Not on file   Social Determinants of Health   Financial Resource Strain:   . Difficulty of Paying Living Expenses: Not on file  Food Insecurity:   . Worried About Charity fundraiser in the Last Year: Not on file  . Ran Out of Food in the Last Year: Not on file  Transportation Needs:   . Lack of Transportation (Medical): Not on file  . Lack of Transportation (Non-Medical): Not on file  Physical Activity:   . Days of Exercise per Week: Not on file  . Minutes of Exercise per Session: Not on file  Stress:   . Feeling of Stress : Not on file  Social Connections:   . Frequency of Communication with Friends and Family: Not on file  . Frequency of Social Gatherings with Friends and Family: Not on file  . Attends Religious Services: Not on file  . Active Member of Clubs or Organizations: Not on file  . Attends Archivist Meetings: Not on file  . Marital Status: Not on file  Intimate Partner Violence:   . Fear of Current or Ex-Partner: Not on file  . Emotionally Abused: Not on file  . Physically Abused: Not on file  . Sexually Abused: Not on file     Family History  Problem Relation Age of Onset  .  Bone cancer Mother   . Heart disease Mother   . Cancer Sister        metastatic; unknown primary  . Diverticulosis Sister   . Tuberculosis Father   . Diabetes Sister   . Hypertension Sister   . Colon cancer Neg Hx   . Esophageal cancer Neg Hx   . Pancreatic cancer Neg Hx   . Stomach cancer Neg Hx   . Liver disease Neg Hx   . Kidney disease Neg Hx   . Rectal cancer Neg Hx  Review of Systems: All other systems reviewed and are otherwise negative except as noted above.  Physical Exam: Vitals:   08/24/19 0630 08/24/19 0645 08/24/19 0700 08/24/19 0715  BP: (!) 144/51 (!) 152/57 (!) 146/61 (!) 136/58  Pulse: (!) 56 (!) 51 (!) 59 (!) 54  Resp: (!) 24 (!) 21 (!) 21 17  Temp:      TempSrc:      SpO2: 97% 97% 96% 97%  Weight:      Height:        GEN- The patient is elderly appearing, alert and oriented x 3 today.   HEENT: normocephalic, atraumatic; sclera clear, conjunctiva pink; hearing intact; oropharynx clear; neck supple Lungs- Clear to ausculation bilaterally, normal work of breathing.  No wheezes, rales, rhonchi Heart- Regular rate and rhythm, no murmurs, rubs or gallops GI- soft, non-tender, non-distended, bowel sounds present Extremities- no clubbing, cyanosis, or edema; DP/PT/radial pulses 2+ bilaterally MS- no significant deformity or atrophy Skin- warm and dry, no rash or lesion Psych- euthymic mood, full affect Neuro- strength and sensation are intact  Labs:   Lab Results  Component Value Date   WBC 5.6 08/24/2019   HGB 10.5 (L) 08/24/2019   HCT 31.0 (L) 08/24/2019   MCV 89.9 08/24/2019   PLT 219 08/24/2019    Recent Labs  Lab 08/24/19 0637  NA 143  K 3.1*  CL 109  CO2 23  BUN 8  CREATININE 0.99  CALCIUM 7.9*  GLUCOSE 101*      Radiology/Studies: DG Abd 1 View  Result Date: 08/04/2019 CLINICAL DATA:  OG tube placement EXAM: ABDOMEN - 1 VIEW COMPARISON:  None. FINDINGS: Enteric tube tip overlies the distal stomach. Endotracheal tube is now 3  cm above the carina. Unremarkable bowel gas pattern. IMPRESSION: Enteric tube tip overlies distal stomach. Electronically Signed   By: Macy Mis M.D.   On: 08/04/2019 08:49   CT HEAD WO CONTRAST  Result Date: 08/04/2019 CLINICAL DATA:  Neuro deficit with stroke suspected EXAM: CT HEAD WITHOUT CONTRAST TECHNIQUE: Contiguous axial images were obtained from the base of the skull through the vertex without intravenous contrast. 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 No evidence of infarct progression or hemorrhagic transformation. Suboccipital craniectomy. No hydrocephalus or masslike finding. History of Chiari malformation. Vascular: No hyperdense vessel. Skull: Suboccipital craniectomy Sinuses/Orbits: Negative IMPRESSION: 1. No acute finding when compared to MRI 2 days ago. 2. Recent right basal ganglia perforator infarct without hemorrhage or visible progression. Electronically Signed   By: Monte Fantasia M.D.   On: 08/04/2019 11:04   CT Head Wo Contrast  Result Date: 08/02/2019 CLINICAL DATA:  Transient ischemic attack (TIA). Maxillofacial pain. Additional history provided: Patient presents for left-sided facial pressure and dizziness, pressure feeling radiates to left side of face and to ear. EXAM: CT HEAD WITHOUT CONTRAST CT MAXILLOFACIAL WITHOUT CONTRAST TECHNIQUE: Contiguous axial images were obtained from the base of the skull through the vertex without intravenous contrast. Multidetector CT imaging of the maxillofacial structures was performed. Multiplanar CT image reconstructions were also generated. A small metallic BB was placed on the right temple in order to reliably differentiate right from left. COMPARISON:  No pertinent prior studies available for comparison. FINDINGS: CT HEAD FINDINGS Brain: Streak artifact limits evaluation of the inferomedial cerebellum. There is no evidence of acute intracranial hemorrhage. No  demarcated cortical infarction. No evidence of intracranial mass. No midline shift or extra-axial fluid collection.  There is a small region of ill-defined hypodensity the right frontal lobe white matter (series 4, images 20-24). Age-indeterminate lacunar infarct within the right lentiform nucleus/external capsule (series 4, images 16 and 17). Small chronic lacunar infarct within the right caudate. 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 Mild generalized parenchymal atrophy. Vascular: No hyperdense vessel.  Atherosclerotic calcifications Skull: Sequela of prior posterior fossa decompression, including a postsurgical defect within the C1 posterior arch. No calvarial fracture or aggressive osseous lesion. CT MAXILLOFACIAL FINDINGS Osseous: No maxillofacial fracture. Small bony exostosis along the superficial aspect of the posterior right mandibular body (series 5, image 29). Orbits: No abnormality identified. Sinuses: No significant paranasal sinus disease. Soft tissues: Carotid artery calcified plaque. The visualized maxillofacial and upper neck soft tissues are otherwise unremarkable. Streak artifact from dental restoration significant limits evaluation of the oral cavity and also somewhat limits evaluation of the oropharynx. Within this limitation, there is no appreciable mass or swelling within the oral cavity, pharynx or larynx. Other: The temporomandibular joints are unremarkable. Left mastoid effusion. IMPRESSION: CT head: 1. Age-indeterminate lacunar infarct within the right lentiform nucleus/external capsule. 2. Small focus of ill-defined hypoattenuation within the right frontal lobe subcortical white matter which may reflect chronic ischemic change. A recent white matter infarct at this site cannot be excluded. 3. Chronic lacunar infarct within the right caudate. 4. 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 5. Mild generalized parenchymal atrophy. CT  maxillofacial: 1. Streak artifact from dental restoration limits evaluation of the oral cavity and oropharynx. Within this limitation, no swelling or discrete mass is appreciated within the oral cavity or pharynx. 2. Small left mastoid effusion. Electronically Signed   By: Kellie Simmering DO   On: 08/02/2019 18:50   MR ANGIO HEAD WO CONTRAST  Result Date: 08/02/2019 CLINICAL DATA:  Left-sided facial numbness EXAM: MRI HEAD WITHOUT CONTRAST MRA HEAD WITHOUT CONTRAST TECHNIQUE: Multiplanar, multiecho pulse sequences of the brain and surrounding structures were obtained without intravenous contrast. Angiographic images of the head were obtained using MRA technique without contrast. COMPARISON:  08/02/2019 FINDINGS: MRI HEAD FINDINGS Brain: There is an area of abnormal diffusion restriction within the right basal ganglia. Multifocal white matter hyperintensity, most commonly due to chronic ischemic microangiopathy. Normal volume of CSF spaces. No chronic microhemorrhage. Normal midline structures. Vascular: Normal flow voids. Skull and upper cervical spine: Normal marrow signal. Sinuses/Orbits: Small amount of left mastoid fluid. Sinuses are clear. Normal orbits. Other: None MRA HEAD FINDINGS POSTERIOR CIRCULATION: --Vertebral arteries: Normal V4 segments. --Posterior inferior cerebellar arteries (PICA): Patent origins from the vertebral arteries. --Anterior inferior cerebellar arteries (AICA): Patent origins from the basilar artery. --Basilar artery: Normal. --Superior cerebellar arteries: Normal. --Posterior cerebral arteries: Normal. Both originate from the basilar artery. Posterior communicating arteries (p-comm) are diminutive or absent. ANTERIOR CIRCULATION: --Intracranial internal carotid arteries: Normal. --Anterior cerebral arteries (ACA): Normal. Both A1 segments are present. Patent anterior communicating artery (a-comm). --Middle cerebral arteries (MCA): Normal. IMPRESSION: 1. Acute ischemic infarct of the  right basal ganglia. No hemorrhage or mass effect. 2. Normal intracranial MRA. Electronically Signed   By: Ulyses Jarred M.D.   On: 08/02/2019 22:02   MR BRAIN WO CONTRAST  Result Date: 08/15/2019 CLINICAL DATA:  Focal neuro deficit, greater than 6 hours, stroke suspected. Additional history provided CHIEF COMPLAINT: Right-sided weakness.  Right-sided weakness. EXAM: MRI HEAD WITHOUT CONTRAST TECHNIQUE: Multiplanar, multiecho pulse sequences of the brain and surrounding structures were obtained without intravenous contrast. COMPARISON:  Noncontrast head CT 08/14/2019, brain MRI/MRA  08/02/2019. FINDINGS: Brain: Again demonstrated is abnormal restricted diffusion within the right basal ganglia and radiating white matter tracts consistent with acute/subacute infarction. Restricted diffusion is slightly increased in extent along the posterior aspect of the infarction territory as compared to brain MRI 08/02/2019. Corresponding T2/FLAIR hyperintensity at this site. No significant mass effect, effacement of the ventricular system or midline shift. No acute infarct is demonstrated elsewhere within the brain. Redemonstrated patchy T2/FLAIR hyperintensity within the cerebral white matter which is nonspecific, but consistent with chronic small vessel ischemic disease. Redemonstrated chronic lacunar infarcts within bilateral basal ganglia. No evidence of intracranial mass. No extra-axial fluid collection. No chronic intracranial blood products. Cerebral volume is normal for age. Redemonstrated Chiari I malformation with sequela of prior posterior fossa decompression. Vascular: Sequela prior posterior Skull and upper cervical spine: Sequela of prior Chiari decompression. No focal marrow lesion. Sinuses/Orbits: Visualized orbits demonstrate no acute abnormality. Minimal ethmoid sinus mucosal thickening left mastoid effusion. Trace fluid also present within right mastoid air cells. IMPRESSION: Acute/subacute infarction changes  again demonstrated within the right basal ganglia and radiating white matter tracts. Infarction changes along the posterior aspect of the infarction territory have slightly increased in extent from MRI 08/02/2019. No new acute infarct demonstrated elsewhere within the brain. Stable chronic small vessel ischemic disease with chronic bilateral basal ganglia lacunar infarcts. Redemonstrated Chiari I malformation with sequela of prior Chiari decompression. Left mastoid effusion. Electronically Signed   By: Kellie Simmering DO   On: 08/15/2019 08:09   MR BRAIN WO CONTRAST  Result Date: 08/02/2019 CLINICAL DATA:  Left-sided facial numbness EXAM: MRI HEAD WITHOUT CONTRAST MRA HEAD WITHOUT CONTRAST TECHNIQUE: Multiplanar, multiecho pulse sequences of the brain and surrounding structures were obtained without intravenous contrast. Angiographic images of the head were obtained using MRA technique without contrast. COMPARISON:  08/02/2019 FINDINGS: MRI HEAD FINDINGS Brain: There is an area of abnormal diffusion restriction within the right basal ganglia. Multifocal white matter hyperintensity, most commonly due to chronic ischemic microangiopathy. Normal volume of CSF spaces. No chronic microhemorrhage. Normal midline structures. Vascular: Normal flow voids. Skull and upper cervical spine: Normal marrow signal. Sinuses/Orbits: Small amount of left mastoid fluid. Sinuses are clear. Normal orbits. Other: None MRA HEAD FINDINGS POSTERIOR CIRCULATION: --Vertebral arteries: Normal V4 segments. --Posterior inferior cerebellar arteries (PICA): Patent origins from the vertebral arteries. --Anterior inferior cerebellar arteries (AICA): Patent origins from the basilar artery. --Basilar artery: Normal. --Superior cerebellar arteries: Normal. --Posterior cerebral arteries: Normal. Both originate from the basilar artery. Posterior communicating arteries (p-comm) are diminutive or absent. ANTERIOR CIRCULATION: --Intracranial internal  carotid arteries: Normal. --Anterior cerebral arteries (ACA): Normal. Both A1 segments are present. Patent anterior communicating artery (a-comm). --Middle cerebral arteries (MCA): Normal. IMPRESSION: 1. 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 No hemorrhage or mass effect. 2. Normal intracranial MRA. Electronically Signed   By: Ulyses Jarred M.D.   On: 08/02/2019 22:02   CARDIAC CATHETERIZATION  Result Date: 08/04/2019 Left Heart Catheterization 08/04/19: LV: Global hypokinesis, upper limit of normal size, EF 25 to 30%. Left main: Normal. LAD: Mild diffuse disease.  Proximal LAD has a 30 to 40% stenosis, mid segment has a 20 to 30% stenosis, scattered disease noted in the LAD.  Brisk flow. Circumflex: Again scattered disease noted in the circumflex.  Mid segment has at most a 30 to 40% stenosis which appears to be eccentric and calcified. RCA: Dominant.  Mild diffuse disease again noted.  Mid segment after the origin of RV branch has a 70 to  80% stenosis.  Brisk flow is evident throughout the RCA. Impression: Findings consistent with nonischemic cardiomyopathy.  Although she has significant disease in the right coronary artery, this does not explain her presentation with global hypokinesis, neither does it explain VF arrest as the lesion does not appear to be unstable. 35 mL contrast used.   DG Chest Portable 1 View  Result Date: 08/24/2019 CLINICAL DATA:  Life vest actuation EXAM: PORTABLE CHEST 1 VIEW COMPARISON:  08/06/2019 FINDINGS: The heart size and mediastinal contours are within normal limits. Both lungs are clear. Dense calcified pulmonary nodule of the right upper lobe. The visualized skeletal structures are unremarkable. IMPRESSION: No acute abnormality of the lungs in AP portable projection. Electronically Signed   By: Eddie Candle M.D.   On: 08/24/2019 08:18   DG Chest Port 1 View  Result Date: 08/06/2019 CLINICAL DATA:  Status update EXAM: PORTABLE CHEST 1 VIEW COMPARISON:  Two  days ago FINDINGS: Extubation with stable inflation. Long-standing calcified density over the right scapula, seen since 2010 at least. Generous heart size. There is no edema, consolidation, effusion, or pneumothorax. IMPRESSION: Clear lungs after extubation. Electronically Signed   By: Monte Fantasia M.D.   On: 08/06/2019 07:27   DG CHEST PORT 1 VIEW  Result Date: 08/04/2019 CLINICAL DATA:  Respiratory arrest EXAM: PORTABLE CHEST 1 VIEW COMPARISON:  02/10/2017 FINDINGS: Endotracheal tube with tip 10 mm above the carina. Symmetric low volume lungs with mild interstitial coarsening. There is no edema, consolidation, effusion, or pneumothorax. Normal heart size and mediastinal contours. IMPRESSION: 1. Endotracheal tube with tip 10 mm above the carina. 2. Symmetric low volume lungs. Electronically Signed   By: Monte Fantasia M.D.   On: 08/04/2019 07:11   DG Swallowing Func-Speech Pathology  Result Date: 08/05/2019 Objective Swallowing Evaluation: Type of Study: MBS-Modified Barium Swallow Study  Patient Details Name: TIERNEY BEHL MRN: [id number] Date of Birth: 08-Apr-1951 Today's Date: 08/05/2019 Time: SLP Start Time (ACUTE ONLY): 1027 -SLP Stop Time (ACUTE ONLY): 1100 SLP Time Calculation (min) (ACUTE ONLY): 18 min Past Medical History: Past Medical History: Diagnosis Date . Allergy  . Anxiety  . Arnold-Chiari malformation ([place])  . Arthritis  . GERD (gastroesophageal reflux disease)  . H. pylori infection   3 years ago . Hyperlipidemia  . Incontinence  . Syncope and collapse   Per pt, denies passing out . Tobacco use disorder  . Unspecified essential hypertension  Past Surgical History: Past Surgical History: Procedure Laterality Date . APPENDECTOMY   . ARNOLD CHIARI SURGERY    neurocranial surgery . BLEPHAROPLASTY    Bil . C-EYE SURGERY PROCEDURE   . CHOLECYSTECTOMY   . EYE SURGERY   . LEFT HEART CATH AND CORONARY ANGIOGRAPHY N/A 08/04/2019  Procedure: LEFT HEART CATH AND CORONARY ANGIOGRAPHY;  Surgeon: Adrian Prows, MD;  Location: [facility];  Service: Cardiovascular;  Laterality: N/A; . MASS EXCISION Right 12/27/2013  Procedure: MINOR EXCISION OF RIGHT THUMB MUCOID CYST, DEBRIDEMENT OF INTERPHALANGEAL JOINT;  Surgeon: Cammie Sickle, MD;  Location: [facility];  Service: Orthopedics;  Laterality: Right; . mass on thumb  right . TOTAL KNEE ARTHROPLASTY Right 02/17/2017 . TOTAL KNEE ARTHROPLASTY Right 02/17/2017  Procedure: TOTAL KNEE ARTHROPLASTY;  Surgeon: Melrose Nakayama, MD;  Location: [facility];  Service: Orthopedics;  Laterality: Right; . TUBAL LIGATION   HPI: JEN BENEDICT is a 69 y.o. female with medical history significant for anxiety, hypertension, and Chiari I malformation, presented to the emergency department with  difficulty swallowing, dizziness, and left facial pressure on 08/02/19. 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 ST recommended MBS to be completed 2/28 however morning of 2022/08/27 pt had Vfib, coded and intubated for several hours.   Subjective: "I'm not trying to be here too long" Assessment / Plan / Recommendation CHL IP CLINICAL IMPRESSIONS 08/05/2019 Clinical Impression Pt exhibits moderate oropharyngeal dysphagia marked by discoordination and mistiming of swallowing sequence and suspected esophageal disturbance. Orally, barium fell under tongue as she manipulated and propelled but able to clear oral cavity. Timing of epiglottic deflection was late with barium passing epiglottis then pushed into laryngeal vestibule and aspirated with thin and nectar consistencies and penetration with honey thick. She sensed only one of two aspiration episodes. Tucking of chin reduced airway compromise to flash with nectar however pt taking large sips and ability to consistently is questionable. Minimal vallecular residue present. Pt reported symptoms of reflux/esophageal difficulties for "a long time". Esophageal scan revealed barium retention at mid esophagus. Recommend  continue Dys 3, downgrade liquids to honey, pills whole in puree, stay upright after meals.     SLP Visit Diagnosis Dysphagia, oropharyngeal phase (R13.12) Attention and concentration deficit following -- Frontal lobe and executive function deficit following -- Impact on safety and function Moderate aspiration risk   CHL IP TREATMENT RECOMMENDATION 08/05/2019 Treatment Recommendations Therapy as outlined in treatment plan below   Prognosis 08/05/2019 Prognosis for Safe Diet Advancement Good Barriers to Reach Goals -- Barriers/Prognosis Comment -- CHL IP DIET RECOMMENDATION 08/05/2019 SLP Diet Recommendations Dysphagia 3 (Mech soft) solids;Honey thick liquids Liquid Administration via Cup Medication Administration Whole meds with puree Compensations Slow rate;Small sips/bites;Clear throat intermittently Postural Changes Remain semi-upright after after feeds/meals (Comment);Seated upright at 90 degrees   CHL IP OTHER RECOMMENDATIONS 08/05/2019 Recommended Consults -- Oral Care Recommendations Oral care BID Other Recommendations Order thickener from pharmacy   CHL IP FOLLOW UP RECOMMENDATIONS 08/05/2019 Follow up Recommendations Other (comment)   CHL IP FREQUENCY AND DURATION 08/05/2019 Speech Therapy Frequency (ACUTE ONLY) min 2x/week Treatment Duration 2 weeks      CHL IP ORAL PHASE 08/05/2019 Oral Phase Impaired Oral - Pudding Teaspoon -- Oral - Pudding Cup -- Oral - Honey Teaspoon -- Oral - Honey Cup Decreased bolus cohesion Oral - Nectar Teaspoon -- Oral - Nectar Cup Decreased bolus cohesion Oral - Nectar Straw -- Oral - Thin Teaspoon -- Oral - Thin Cup Decreased bolus cohesion Oral - Thin Straw -- Oral - Puree -- Oral - Mech Soft -- Oral - Regular (No Data) Oral - Multi-Consistency -- Oral - Pill -- Oral Phase - Comment --  CHL IP PHARYNGEAL PHASE 08/05/2019 Pharyngeal Phase Impaired Pharyngeal- Pudding Teaspoon -- Pharyngeal -- Pharyngeal- Pudding Cup -- Pharyngeal -- Pharyngeal- Honey Teaspoon -- Pharyngeal --  Pharyngeal- Honey Cup Penetration/Aspiration during swallow;Pharyngeal residue - valleculae Pharyngeal Material enters airway, remains ABOVE vocal cords then ejected out Pharyngeal- Nectar Teaspoon -- Pharyngeal -- Pharyngeal- Nectar Cup Penetration/Aspiration during swallow;Pharyngeal residue - valleculae Pharyngeal Material enters airway, passes BELOW cords without attempt by patient to eject out (silent aspiration) Pharyngeal- Nectar Straw -- Pharyngeal -- Pharyngeal- Thin Teaspoon -- Pharyngeal -- Pharyngeal- Thin Cup Penetration/Aspiration during swallow Pharyngeal Material enters airway, passes BELOW cords and not ejected out despite cough attempt by patient Pharyngeal- Thin Straw -- Pharyngeal -- Pharyngeal- Puree -- Pharyngeal -- Pharyngeal- Mechanical Soft -- Pharyngeal -- Pharyngeal- Regular WFL Pharyngeal -- Pharyngeal- Multi-consistency -- Pharyngeal -- Pharyngeal- Pill -- Pharyngeal -- Pharyngeal Comment --  CHL IP CERVICAL ESOPHAGEAL PHASE 08/05/2019 Cervical Esophageal Phase WFL Pudding Teaspoon -- Pudding Cup -- Honey Teaspoon -- Honey Cup -- Nectar Teaspoon -- Nectar Cup -- Nectar Straw -- Thin Teaspoon -- Thin Cup -- Thin Straw -- Puree -- Mechanical Soft -- Regular -- Multi-consistency -- Pill -- Cervical Esophageal Comment -- [place] Siren 08/05/2019, 1:37 PM Orbie Pyo Colvin Caroli.Ed Actor Pager [phone] Office [phone]              ECHOCARDIOGRAM COMPLETE  Result Date: 08/03/2019    ECHOCARDIOGRAM REPORT   Patient Name:   LYNDSAY TALAMANTE Eisenhardt Date of Exam: 08/03/2019 Medical Rec #:  [id number]      Height:       64.0 in Accession #:    [id number]     Weight:       159.2 lb Date of Birth:  04-Sep-1950      BSA:          1.78 m Patient Age:    70 years       BP:           143/64 mmHg Patient Gender: F              HR:           53 bpm. Exam Location:  Inpatient Procedure: 2D Echo, Cardiac Doppler and Color Doppler Indications:    Stroke 434.91  History:        Patient  has no prior history of Echocardiogram examinations.                 Stroke, Arrythmias:LBBB; Risk Factors:Hypertension, Dyslipidemia                 and Current Smoker. GERD.  Sonographer:    Vickie Epley RDCS Referring Phys: [id number] [place]  1. Severely depressed LVEF with global hypokinesis. No apical thrombus visualized on this study, however would consider repeat limited echo with contrast to exclude LV thrombus. Could also consider TEE for further evaluation of cardiac embolism.  2. Left ventricular ejection fraction, by estimation, is 25 to 30%. The left ventricle has severely decreased function. The left ventricle demonstrates global hypokinesis. There is moderate concentric left ventricular hypertrophy. Left ventricular diastolic parameters are indeterminate. Elevated left ventricular end-diastolic pressure.  3. Right ventricular systolic function is normal. The right ventricular size is normal.  4. The mitral valve is normal in structure and function. Mild mitral valve regurgitation.  5. The aortic valve is tricuspid. Aortic valve regurgitation is not visualized. No aortic stenosis is present.  6. The inferior vena cava is dilated in size with <50% respiratory variability, suggesting right atrial pressure of 15 mmHg. FINDINGS  Left Ventricle: Left ventricular ejection fraction, by estimation, is 25 to 30%. The left ventricle has severely decreased function. The left ventricle demonstrates global hypokinesis. The left ventricular internal cavity size was normal in size. There is moderate concentric left ventricular hypertrophy. Left ventricular diastolic parameters are indeterminate. Elevated left ventricular end-diastolic pressure. Right Ventricle: The right ventricular size is normal. No increase in right ventricular wall thickness. Right ventricular systolic function is normal. Left Atrium: Left atrial size was normal in size. Right Atrium: Right atrial size was normal in size.  Pericardium: There is no evidence of pericardial effusion. Mitral Valve: The mitral valve is normal in structure and function. Mild mitral valve regurgitation. Tricuspid Valve: The tricuspid valve is normal in structure. Tricuspid valve regurgitation is trivial. No evidence of tricuspid  stenosis. Aortic Valve: The aortic valve is tricuspid. Aortic valve regurgitation is not visualized. No aortic stenosis is present. Pulmonic Valve: The pulmonic valve was not well visualized. Pulmonic valve regurgitation is not visualized. No evidence of pulmonic stenosis. Aorta: The aortic root and ascending aorta are structurally normal, with no evidence of dilitation. Venous: The inferior vena cava is dilated in size with less than 50% respiratory variability, suggesting right atrial pressure of 15 mmHg. IAS/Shunts: No atrial level shunt detected by color flow Doppler.  LEFT VENTRICLE PLAX 2D LVIDd:         4.51 cm      Diastology LVIDs:         3.70 cm      LV e' lateral:   3.57 cm/s LV PW:         1.59 cm      LV E/e' lateral: 15.4 LV IVS:        1.47 cm      LV e' medial:    2.61 cm/s LVOT diam:     2.00 cm      LV E/e' medial:  21.1 LV SV:         66.60 ml LV SV Index:   19.08 LVOT Area:     3.14 cm  LV Volumes (MOD) LV vol d, MOD A2C: 208.0 ml LV vol d, MOD A4C: 200.0 ml LV vol s, MOD A2C: 123.0 ml LV vol s, MOD A4C: 132.0 ml LV SV MOD A2C:     85.0 ml LV SV MOD A4C:     200.0 ml LV SV MOD BP:      77.0 ml RIGHT VENTRICLE RV S prime:     8.32 cm/s TAPSE (M-mode): 2.4 cm LEFT ATRIUM             Index       RIGHT ATRIUM           Index LA diam:        3.40 cm 1.92 cm/m  RA Area:     10.80 cm LA Vol (A2C):   52.9 ml 29.80 ml/m RA Volume:   26.70 ml  15.04 ml/m LA Vol (A4C):   41.7 ml 23.49 ml/m LA Biplane Vol: 48.8 ml 27.49 ml/m  AORTIC VALVE LVOT Vmax:   96.00 cm/s LVOT Vmean:  57.600 cm/s LVOT VTI:    0.212 m  AORTA Ao Root diam: 3.10 cm MITRAL VALVE MV Area (PHT): 2.05 cm     SHUNTS MV Decel Time: 370 msec      Systemic VTI:  0.21 m MV E velocity: 55.10 cm/s   Systemic Diam: 2.00 cm MV A velocity: 125.00 cm/s MV E/A ratio:  0.44 Buford Dresser MD Electronically signed by Buford Dresser MD Signature Date/Time: 08/03/2019/8:56:44 PM    Final    ECHOCARDIOGRAM LIMITED BUBBLE STUDY  Result Date: 08/04/2019    ECHOCARDIOGRAM LIMITED REPORT   Patient Name:   JAYLYN IYER Christian Date of Exam: 08/04/2019 Medical Rec #:  [id number]      Height:       64.0 in Accession #:    [id number]     Weight:       159.0 lb Date of Birth:  Jul 22, 1950      BSA:          1.77 m Patient Age:    84 years       BP:           96/63 mmHg Patient Gender: F  HR:           121 bpm. Exam Location:  Inpatient Procedure: Limited Echo, Color Doppler, Cardiac Doppler, Saline Contrast Bubble            Study and Intracardiac Opacification Agent STAT ECHO Indications:    Cardiac Arrest, Stroke  History:        Patient has prior history of Echocardiogram examinations, most                 recent 08/03/2019. Risk Factors:Hypertension and Dyslipidemia.  Sonographer:    Raquel Sarna Senior RDCS Referring Phys: [id number] [place]  Sonographer Comments: Limited to assess for cardiac source of thrombus or shunting. IMPRESSIONS  1. Left ventricular ejection fraction, by estimation, is 25 to 30%. The left ventricle has severely decreased function. The left ventricle demonstrates global hypokinesis. There was marked septal-lateral dyssynchrony consistent with LBBB. No LV thrombus  noted on Definity study.  2. Right ventricular systolic function is normal. The right ventricular size is normal. There is normal pulmonary artery systolic pressure.  3. The aortic valve is tricuspid. Aortic valve regurgitation is not visualized. No aortic stenosis is present.  4. The inferior vena cava is normal in size with <50% respiratory variability, suggesting right atrial pressure of 8 mmHg.  5. Bubble study appeared negative for shunt lesion but was a difficult study  due to poor windows.  6. Limited echo. FINDINGS  Left Ventricle: Left ventricular ejection fraction, by estimation, is 25 to 30%. The left ventricle has severely decreased function. The left ventricle demonstrates global hypokinesis. Definity contrast agent was given IV to delineate the left ventricular endocardial borders. The left ventricular internal cavity size was normal in size. There is no left ventricular hypertrophy. Abnormal (paradoxical) septal motion, consistent with left bundle branch block. Right Ventricle: The right ventricular size is normal. No increase in right ventricular wall thickness. Right ventricular systolic function is normal. There is normal pulmonary artery systolic pressure. The tricuspid regurgitant velocity is 2.21 m/s, and  with an assumed right atrial pressure of 8 mmHg, the estimated right ventricular systolic pressure is 12.4 mmHg. Left Atrium: Left atrial size was normal in size. Right Atrium: Right atrial size was normal in size. Tricuspid Valve: The tricuspid valve is normal in structure. Tricuspid valve regurgitation is trivial. Aortic Valve: The aortic valve is tricuspid. Aortic valve regurgitation is not visualized. No aortic stenosis is present. Pulmonic Valve: The pulmonic valve was normal in structure. Pulmonic valve regurgitation is not visualized. Venous: The inferior vena cava is normal in size with less than 50% respiratory variability, suggesting right atrial pressure of 8 mmHg. IAS/Shunts: Agitated saline contrast was given intravenously to evaluate for intracardiac shunting.  RIGHT VENTRICLE RV S prime:     9.14 cm/s TAPSE (M-mode): 1.9 cm AORTIC VALVE LVOT Vmax:   75.00 cm/s LVOT Vmean:  53.300 cm/s LVOT VTI:    0.142 m TRICUSPID VALVE TR Peak grad:   19.5 mmHg TR Vmax:        221.00 cm/s  SHUNTS Systemic VTI: 0.14 m Loralie Champagne MD Electronically signed by Loralie Champagne MD Signature Date/Time: 08/04/2019/10:47:14 AM    Final    CT HEAD CODE STROKE WO  CONTRAST  Result Date: 08/14/2019 CLINICAL DATA:  Code stroke. Right sided weakness. Speech disturbance. Acute stroke presentation. EXAM: CT HEAD WITHOUT CONTRAST TECHNIQUE: Contiguous axial images were obtained from the base of the skull through the vertex without intravenous contrast. COMPARISON:  Head CT 08/04/2019.  MRI 08/02/2019.  FINDINGS: Brain: No acute finding affects the brainstem or cerebellum. Previous suboccipital craniectomy. Cerebral hemispheres show subacute infarction in the right lateral basal ganglia and radiating white matter tracts as seen previously. No new acute infarction. No mass lesion, hemorrhage, hydrocephalus or extra-axial collection. Vascular: There is atherosclerotic calcification of the major vessels at the base of the brain. Skull: Otherwise negative Sinuses/Orbits: Clear/normal Other: None ASPECTS ([place] Stroke Program Early CT Score), assuming left hemisphere insult - Ganglionic level infarction (caudate, lentiform nuclei, internal capsule, insula, M1-M3 cortex): 7 - Supraganglionic infarction (M4-M6 cortex): 3 Total score (0-10 with 10 being normal): 10 IMPRESSION: 1. Subacute infarction in the right lateral basal ganglia/radiating white matter tracts as seen previously. No evidence of hemorrhagic transformation or extension. No acute or focal finding seen elsewhere. 2. ASPECTS is 10, assuming left hemispheric insult clinically. 3. These results were communicated to Dr. Cheral Marker at 10:40 pmon 2/28/2021by text page via the [facility] messaging system. Electronically Signed   By: Nelson Chimes M.D.   On: 08/14/2019 22:41   VAS US CAROTID (at [facility] and [facility] only)  Result Date: 08/03/2019 Carotid Arterial Duplex Study Indications: CVA. Performing Technologist: Maudry Mayhew MHA, RDMS, RVT, RDCS  Examination Guidelines: A complete evaluation includes B-mode imaging, spectral Doppler, color Doppler, and power Doppler as needed of all accessible portions of each vessel. Bilateral  testing is considered an integral part of a complete examination. Limited examinations for reoccurring indications may be performed as noted.  Right Carotid Findings: +----------+--------+--------+--------+--------------------------+--------+           PSV cm/sEDV cm/sStenosisPlaque Description        Comments +----------+--------+--------+--------+--------------------------+--------+ CCA Prox  74      17                                                 +----------+--------+--------+--------+--------------------------+--------+ CCA Distal74      12              heterogenous and smooth            +----------+--------+--------+--------+--------------------------+--------+ ICA Prox  55      17              heterogenous and irregular         +----------+--------+--------+--------+--------------------------+--------+ ICA Distal84      26                                                 +----------+--------+--------+--------+--------------------------+--------+ ECA       111     11              smooth and heterogenous            +----------+--------+--------+--------+--------------------------+--------+ +----------+--------+-------+----------------+-------------------+           PSV cm/sEDV cmsDescribe        Arm Pressure (mmHG) +----------+--------+-------+----------------+-------------------+ [id number]            Multiphasic, WNL                    +----------+--------+-------+----------------+-------------------+ +---------+--------+--+--------+-+---------+ VertebralPSV cm/s27EDV cm/s7Antegrade +---------+--------+--+--------+-+---------+  Left Carotid Findings: +----------+--------+--------+--------+-----------------------+--------+           PSV cm/sEDV cm/sStenosisPlaque Description     Comments +----------+--------+--------+--------+-----------------------+--------+ CCA Prox  74  12              smooth and heterogenous          +----------+--------+--------+--------+-----------------------+--------+ CCA Distal81      18                                              +----------+--------+--------+--------+-----------------------+--------+ ICA Prox  91      15              smooth and heterogenous         +----------+--------+--------+--------+-----------------------+--------+ ICA Distal66      13                                              +----------+--------+--------+--------+-----------------------+--------+ ECA       98      10              smooth and heterogenous         +----------+--------+--------+--------+-----------------------+--------+ +----------+--------+--------+----------------+-------------------+           PSV cm/sEDV cm/sDescribe        Arm Pressure (mmHG) +----------+--------+--------+----------------+-------------------+ [id number]             Multiphasic, WNL                    +----------+--------+--------+----------------+-------------------+ +---------+--------+--+--------+--+---------+ VertebralPSV cm/s59EDV cm/s13Antegrade +---------+--------+--+--------+--+---------+   Summary: Right Carotid: Velocities in the right ICA are consistent with a 1-39% stenosis. Left Carotid: Velocities in the left ICA are consistent with a 1-39% stenosis. Vertebrals:  Bilateral vertebral arteries demonstrate antegrade flow. Subclavians: Normal flow hemodynamics were seen in bilateral subclavian              arteries. *See table(s) above for measurements and observations.  Electronically signed by Antony Contras MD on 08/03/2019 at 5:14:31 PM.    Final    CT Maxillofacial Wo Contrast  Result Date: 08/02/2019 CLINICAL DATA:  Transient ischemic attack (TIA). Maxillofacial pain. Additional history provided: Patient presents for left-sided facial pressure and dizziness, pressure feeling radiates to left side of face and to ear. EXAM: CT HEAD WITHOUT CONTRAST CT MAXILLOFACIAL WITHOUT  CONTRAST TECHNIQUE: Contiguous axial images were obtained from the base of the skull through the vertex without intravenous contrast. Multidetector CT imaging of the maxillofacial structures was performed. Multiplanar CT image reconstructions were also generated. A small metallic BB was placed on the right temple in order to reliably differentiate right from left. COMPARISON:  No pertinent prior studies available for comparison. FINDINGS: CT HEAD FINDINGS Brain: Streak artifact limits evaluation of the inferomedial cerebellum. There is no evidence of acute intracranial hemorrhage. No demarcated cortical infarction. No evidence of intracranial mass. No midline shift or extra-axial fluid collection. There is a small region of ill-defined hypodensity the right frontal lobe white matter (series 4, images 20-24). Age-indeterminate lacunar infarct within the right lentiform nucleus/external capsule (series 4, images 16 and 17). Small chronic lacunar infarct within the right caudate. 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 Mild generalized parenchymal atrophy. Vascular: No hyperdense vessel.  Atherosclerotic calcifications Skull: Sequela of prior posterior fossa decompression, including a postsurgical defect within the C1 posterior arch. No calvarial fracture or aggressive osseous lesion. CT MAXILLOFACIAL FINDINGS Osseous: No maxillofacial fracture. Small bony exostosis  along the superficial aspect of the posterior right mandibular body (series 5, image 29). Orbits: No abnormality identified. Sinuses: No significant paranasal sinus disease. Soft tissues: Carotid artery calcified plaque. The visualized maxillofacial and upper neck soft tissues are otherwise unremarkable. Streak artifact from dental restoration significant limits evaluation of the oral cavity and also somewhat limits evaluation of the oropharynx. Within this limitation, there is no appreciable mass or swelling within the oral cavity,  pharynx or larynx. Other: The temporomandibular joints are unremarkable. Left mastoid effusion. IMPRESSION: CT head: 1. Age-indeterminate lacunar infarct within the right lentiform nucleus/external capsule. 2. Small focus of ill-defined hypoattenuation within the right frontal lobe subcortical white matter which may reflect chronic ischemic change. A recent white matter infarct at this site cannot be excluded. 3. Chronic lacunar infarct within the right caudate. 4. 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 5. Mild generalized parenchymal atrophy. CT maxillofacial: 1. Streak artifact from dental restoration limits evaluation of the oral cavity and oropharynx. Within this limitation, no swelling or discrete mass is appreciated within the oral cavity or pharynx. 2. Small left mastoid effusion. Electronically Signed   By: Kellie Simmering DO   On: 08/02/2019 18:50    EKG: NSR at 61 bpm (personally reviewed)  TELEMETRY: NSR 60-70s at this time (personally reviewed)  DEVICE HISTORY: Lifevest placed during admissino 07/2019 with shock 08/24/19 approx 1 am.  Assessment/Plan: 1.  Torsades de Pointes/VF Previously she had prolonged QT interval prior to going into torsades which transitioned to VF. It was thought possible this was aggravated by her CVA and other intracranial issues.  Now with recurrent despite being approx 1 month out from her CVA and has been started on GMDT for her HF.  Her K was 2.7 on admission, though she denies any medical non-compliance, diarrhea, or N/V.  With recurrence and Lifevest discharge, suspect patient Lourine Alberico need ICD; likely this admission.  Alixandria Friedt follow K supp and recheck BMET this afternoon, following for further.  Sarit Sparano load with amiodarione  2. Chronic systolic CHF Continue GMDT through Dr. Einar Gip Continue coreg Continue [name] as tolerated Continue spironolactone Follow K. She is not on diuretics at this time.   3. PAF She is on eliquis for  CHA2DS2VASC of at least 6    Prudencio Velazco leave NPO for this am and discuss further with MD timing of potential ICD.  For questions or updates, please contact [place] Please consult [email or web address] for contact info under Cardiology/STEMI.  Signed, Shirley Friar, PA-C  08/24/2019 8:24 AM   I have seen and examined this patient with Oda Kilts.  Agree with above, note added to reflect my findings.  On exam, RRR, no murmurs, lungs clear.  Patient presented to the hospital after receiving a call from the [facility], telling her that her LifeVest went off.  She was sitting on the couch late last night after she had gotten up to get a glass of water.  When she awoke, she had gel on her back.  Review of her LifeVest strips show sinus rhythm with a PVC and a post PVC pause followed by possibly a sinus beat, PVC potentially on a T wave and torsades which degenerated into ventricular fibrillation.  This is somewhat similar to her prior episode of torsades and VF.  She Jamaal Bernasconi need an ICD prior to discharge.  She was quite hypokalemic on admission to the hospital.  Would replete her potassium.  Would also start amiodarone load.  Ameri Cahoon M. Samanyu Tinnell MD 08/24/2019 11:15  AM     

## 2019-08-24 NOTE — Progress Notes (Signed)
This encounter was created in error - please disregard.  This encounter was created in error - please disregard.

## 2019-08-25 ENCOUNTER — Encounter (HOSPITAL_COMMUNITY): Payer: Self-pay

## 2019-08-25 ENCOUNTER — Inpatient Hospital Stay (HOSPITAL_COMMUNITY)
Admission: EM | Disposition: A | Discharge status: Home / Self Care | Payer: Self-pay | Source: Home / Self Care | Attending: Cardiology

## 2019-08-25 DIAGNOSIS — I5022 Chronic systolic (congestive) heart failure: Secondary | ICD-10-CM

## 2019-08-25 DIAGNOSIS — I428 Other cardiomyopathies: Secondary | ICD-10-CM

## 2019-08-25 HISTORY — PX: BIV ICD INSERTION CRT-D: EP1195

## 2019-08-25 LAB — CBC
HCT: 38.9 % (ref 36.0–46.0)
Hemoglobin: 12.7 g/dL (ref 12.0–15.0)
MCH: 29.1 pg (ref 26.0–34.0)
MCHC: 32.6 g/dL (ref 30.0–36.0)
MCV: 89.2 fL (ref 80.0–100.0)
Platelets: 261 10*3/uL (ref 150–400)
RBC: 4.36 MIL/uL (ref 3.87–5.11)
RDW: 13.1 % (ref 11.5–15.5)
WBC: 6.1 10*3/uL (ref 4.0–10.5)
nRBC: 0 % (ref 0.0–0.2)

## 2019-08-25 LAB — BASIC METABOLIC PANEL
Anion gap: 12 (ref 5–15)
BUN: 8 mg/dL (ref 8–23)
CO2: 26 mmol/L (ref 22–32)
Calcium: 8.7 mg/dL — ABNORMAL LOW (ref 8.9–10.3)
Chloride: 107 mmol/L (ref 98–111)
Creatinine, Ser: 1.1 mg/dL — ABNORMAL HIGH (ref 0.44–1.00)
GFR calc Af Amer: 59 mL/min — ABNORMAL LOW (ref >60)
GFR calc non Af Amer: 51 mL/min — ABNORMAL LOW (ref >60)
Glucose, Bld: 108 mg/dL — ABNORMAL HIGH (ref 70–99)
Potassium: 3.5 mmol/L (ref 3.5–5.1)
Sodium: 145 mmol/L (ref 135–145)

## 2019-08-25 LAB — SURGICAL PCR SCREEN
MRSA, PCR: NEGATIVE
Staphylococcus aureus: NEGATIVE

## 2019-08-25 LAB — APTT: aPTT: 36 seconds (ref 24–36)

## 2019-08-25 SURGERY — BIV ICD INSERTION CRT-D

## 2019-08-25 MED ORDER — CEFAZOLIN SODIUM-DEXTROSE 2-4 GM/100ML-% IV SOLN: 2.0000 g | INTRAVENOUS | Status: DC

## 2019-08-25 MED ORDER — MIDAZOLAM HCL 5 MG/5ML IJ SOLN
INTRAMUSCULAR | Status: DC | PRN
  Administered 2019-08-25: 1 mg via INTRAVENOUS

## 2019-08-25 MED ORDER — CEFAZOLIN SODIUM-DEXTROSE 1-4 GM/50ML-% IV SOLN
1.0000 g | Freq: Four times a day (QID) | INTRAVENOUS | Status: AC
  Administered 2019-08-25 – 2019-08-26 (×3): 1 g via INTRAVENOUS
  Filled 2019-08-25 (×3): qty 50

## 2019-08-25 MED ORDER — DIPHENHYDRAMINE HCL 25 MG PO CAPS
25.0000 mg | ORAL_CAPSULE | Freq: Once | ORAL | Status: AC
  Administered 2019-08-25: 25 mg via ORAL
  Filled 2019-08-25: qty 1

## 2019-08-25 MED ORDER — SODIUM CHLORIDE 0.9 % IV SOLN
INTRAVENOUS | Status: AC
  Filled 2019-08-25: qty 2

## 2019-08-25 MED ORDER — CEFAZOLIN SODIUM-DEXTROSE 2-4 GM/100ML-% IV SOLN
INTRAVENOUS | Status: AC
  Filled 2019-08-25: qty 100

## 2019-08-25 MED ORDER — SODIUM CHLORIDE 0.9 % IV SOLN
80.0000 mg | INTRAVENOUS | Status: DC
  Administered 2019-08-25: 80 mg

## 2019-08-25 MED ORDER — IOHEXOL 350 MG/ML SOLN
INTRAVENOUS | Status: DC | PRN
  Administered 2019-08-25: 5 mL

## 2019-08-25 MED ORDER — FENTANYL CITRATE (PF) 100 MCG/2ML IJ SOLN
INTRAMUSCULAR | Status: AC
  Filled 2019-08-25: qty 2

## 2019-08-25 MED ORDER — HEPARIN (PORCINE) IN NACL 1000-0.9 UT/500ML-% IV SOLN
INTRAVENOUS | Status: AC
  Filled 2019-08-25: qty 500

## 2019-08-25 MED ORDER — POTASSIUM CHLORIDE CRYS ER 20 MEQ PO TBCR
40.0000 meq | EXTENDED_RELEASE_TABLET | Freq: Once | ORAL | Status: AC
  Administered 2019-08-25: 40 meq via ORAL
  Filled 2019-08-25: qty 2

## 2019-08-25 MED ORDER — LIDOCAINE HCL 1 % IJ SOLN
INTRAMUSCULAR | Status: AC
  Filled 2019-08-25: qty 60

## 2019-08-25 MED ORDER — MIDAZOLAM HCL 5 MG/5ML IJ SOLN
INTRAMUSCULAR | Status: AC
  Filled 2019-08-25: qty 5

## 2019-08-25 MED ORDER — HEPARIN (PORCINE) IN NACL 1000-0.9 UT/500ML-% IV SOLN
INTRAVENOUS | Status: DC | PRN
  Administered 2019-08-25: 500 mL

## 2019-08-25 MED ORDER — ONDANSETRON HCL 4 MG/2ML IJ SOLN: 4.0000 mg | Freq: Four times a day (QID) | INTRAMUSCULAR | Status: DC | PRN

## 2019-08-25 MED ORDER — FENTANYL CITRATE (PF) 100 MCG/2ML IJ SOLN
INTRAMUSCULAR | Status: DC | PRN
  Administered 2019-08-25: 25 ug via INTRAVENOUS

## 2019-08-25 MED ORDER — ACETAMINOPHEN 325 MG PO TABS
325.0000 mg | ORAL_TABLET | ORAL | Status: DC | PRN
  Administered 2019-08-25: 650 mg via ORAL
  Filled 2019-08-25 (×3): qty 2

## 2019-08-25 MED ORDER — CEFAZOLIN SODIUM-DEXTROSE 2-3 GM-%(50ML) IV SOLR
INTRAVENOUS | Status: AC | PRN
  Administered 2019-08-25: 13:00:00 2 g via INTRAVENOUS

## 2019-08-25 SURGICAL SUPPLY — 18 items
BALLN ATTAIN 80 (BALLOONS) ×2
BALLOON ATTAIN 80 (BALLOONS) IMPLANT
CABLE SURGICAL S-101-97-12 (CABLE) ×2 IMPLANT
CATH ATTAIN COMMAND 6250-MB2 (CATHETERS) ×1 IMPLANT
CATH HEX JOS 2-5-2 65CM 6F REP (CATHETERS) ×1 IMPLANT
ICD CLARIA MRI DTMA1QQ (ICD Generator) ×1 IMPLANT
KIT ESSENTIALS PG (KITS) ×2 IMPLANT
LEAD ATTAIN PERFORMA S 4598-88 (Lead) ×1 IMPLANT
LEAD CAPSURE NOVUS 45CM (Lead) ×1 IMPLANT
LEAD SPRINT QUAT SEC 6935M-55 (Lead) ×1 IMPLANT
PAD PRO RADIOLUCENT 2001M-C (PAD) ×2 IMPLANT
SHEATH 7FR PRELUDE SNAP 13 (SHEATH) ×1 IMPLANT
SHEATH 9.5FR PRELUDE SNAP 13 (SHEATH) ×1 IMPLANT
SHEATH 9FR PRELUDE SNAP 13 (SHEATH) ×1 IMPLANT
SLITTER 6232ADJ (MISCELLANEOUS) ×1 IMPLANT
TRAY PACEMAKER INSERTION (PACKS) ×2 IMPLANT
WIRE ACUITY WHISPER EDS 4648 (WIRE) ×1 IMPLANT
WIRE HI TORQ VERSACORE-J 145CM (WIRE) ×1 IMPLANT

## 2019-08-25 NOTE — Progress Notes (Signed)
Subjective:  Patient seen and examined at bedside approximately 8 AM.  No events overnight.  Patient denies any chest pain or shortness of breath.  Telemetry reviewed.  Case discussed with the patient's nurse as well.  Objective:  Vital Signs in the last 24 hours: Temp:  [98 F (36.7 C)-98.4 F (36.9 C)] 98 F (36.7 C) (03/11 0424) Pulse Rate:  [49-65] 63 (03/11 0424) Resp:  [15-25] 18 (03/11 0424) BP: (113-154)/(45-137) 136/63 (03/11 0424) SpO2:  [90 %-98 %] 90 % (03/11 0424) Weight:  [72.3 kg] 72.3 kg (03/11 0424)  Intake/Output from previous day: 03/10 0701 - 03/11 0700 In: 1417.4 [P.O.:240; I.V.:977.4; IV Piggyback:200] Out: 900 [Urine:900]  Physical Exam CONSTITUTIONAL:  Age-appropriate, no acute distress, hemodynamically stable.   SKIN: Skin is warm and dry. No cyanosis. No pallor. No jaundice HEAD: Normocephalic and atraumatic.  EYES: No scleral icterus MOUTH/THROAT: Moist oral membranes.  NECK: No JVD present. No thyromegaly noted.  LYMPHATIC: No visible cervical adenopathy.  CHEST Normal respiratory effort. No intercostal retractions.  Defibrillator pads currently placed anterior and posteriorly.  Mild erythema noted posteriorly involving the upper thorax most likely secondary to LifeVest discharge. LUNGS:Clear to auscultation bilaterally.No stridor. No wheezes. No rales.  CARDIOVASCULAR:Regular, positive E9-H3, soft holosystolic murmur heard at the apex radiating to axilla, no gallops or rubs appreciated ABDOMINAL:Nonobese, soft, nontender, nondistended, positive bowel sounds in all 4 quadrants no apparent ascites.  EXTREMITIES: No peripheral edema.2+ right femoral pulse, 1+ left femoral pulse, none palpable bilateral popliteal, dorsalis pedis or posterior tibial pulses. Warm to touch bilaterally. No discoloration or cyanosis present. HEMATOLOGIC: No significant bruising NEUROLOGIC: Oriented to person, place, and time. Nonfocal. Normal muscle tone.  PSYCHIATRIC:  Normal mood and affect. Normal behavior. Cooperative  Lab Results: BMP Recent Labs    08/24/19 0942 08/24/19 2035 08/25/19 0501  NA 140 141 145  K 4.1 3.3* 3.5  CL 106 108 107  CO2 25 25 26   GLUCOSE 131* 157* 108*  BUN 8 8 8   CREATININE 1.17* 1.18* 1.10*  CALCIUM 8.3* 8.5* 8.7*  GFRNONAA 48* 47* 51*  GFRAA 55* 54* 59*    CBC Recent Labs  Lab 08/24/19 0512 08/24/19 0535 08/25/19 0501  WBC 5.6   < > 6.1  RBC 4.07   < > 4.36  HGB 12.0   < > 12.7  HCT 36.6   < > 38.9  PLT 219   < > 261  MCV 89.9   < > 89.2  MCH 29.5   < > 29.1  MCHC 32.8   < > 32.6  RDW 13.2   < > 13.1  LYMPHSABS 1.1  --   --   MONOABS 0.3  --   --   EOSABS 0.3  --   --   BASOSABS 0.1  --   --    < > = values in this interval not displayed.    HEMOGLOBIN A1C Lab Results  Component Value Date   HGBA1C 5.7 (H) 08/03/2019   MPG 116.89 08/03/2019    Cardiac Panel (last 3 results) No results for input(s): CKTOTAL, CKMB, TROPONINI, RELINDX in the last 8760 hours.  BNP (last 3 results) Recent Labs    08/24/19 0512  BNP 617.4*    TSH Recent Labs    08/23/19 1315 08/24/19 0942  TSH 0.75 0.550    Lipid Panel     Component Value Date/Time   CHOL 232 (H) 08/03/2019 0249   TRIG 128 08/03/2019 0249   HDL 31 (L)  08/03/2019 0249   CHOLHDL 7.5 08/03/2019 0249   VLDL 26 08/03/2019 0249   LDLCALC 175 (H) 08/03/2019 0249   LDLDIRECT 193.0 09/02/2016 0839   Hepatic Function Panel Recent Labs    08/07/19 0331 08/14/19 2227 08/15/19 0748  PROT 5.9* 6.5 5.9*  ALBUMIN 3.2* 3.4* 3.1*  AST 32 15 14*  ALT 41 14 13  ALKPHOS 67 104 89  BILITOT 1.2 0.7 0.5    Imaging: DG Chest Portable 1 View  Result Date: 08/24/2019 CLINICAL DATA:  Life vest actuation EXAM: PORTABLE CHEST 1 VIEW COMPARISON:  08/06/2019 FINDINGS: The heart size and mediastinal contours are within normal limits. Both lungs are clear. Dense calcified pulmonary nodule of the right upper lobe. The visualized skeletal  structures are unremarkable. IMPRESSION: No acute abnormality of the lungs in AP portable projection. Electronically Signed   By: Eddie Candle M.D.   On: 08/24/2019 08:18   . [MAR Hold] aspirin EC  81 mg Oral Daily  . [MAR Hold] carvedilol  6.25 mg Oral BID WC  . [MAR Hold] ezetimibe  10 mg Oral Daily  . gentamicin irrigation  80 mg Irrigation On Call  . [MAR Hold] nicotine  21 mg Transdermal Q24H  . [MAR Hold] pantoprazole  40 mg Oral Q1200  . [MAR Hold] potassium chloride  40 mEq Oral Once  . [MAR Hold] rosuvastatin  10 mg Oral q1800  . [MAR Hold] sacubitril-valsartan  1 tablet Oral BID  . [MAR Hold] spironolactone  12.5 mg Oral Daily   Cardiac Studies:  EKG  08/24/2019: Normal sinus rhythm ventricular rate of 61 bpm, left bundle branch block, ST-T changes most likely secondary to underlying LBBB.   Echocardiogram: 08/03/2019: LVEF 25-30%, severely reduced left ventricular systolic function, global hypokinesis, moderate concentric left ventricular hypertrophy, mild MR, 08/04/2019:LVEF 25-30%, severely reduced LV function, global hypokinesis, paradoxical septal motion secondary to LBBB.  Heart Catheterization: 08/04/19: LV: Global hypokinesis, upper limit of normal size, EF 25 to 30%. Left main: Normal. LAD: Mild diffuse disease. Proximal LAD has a 30 to 40% stenosis, mid segment has a 20 to 30% stenosis, scattered disease noted in the LAD. Brisk flow. Circumflex: Again scattered disease noted in the circumflex. Mid segment has at most a 30 to 40% stenosis which appears to be eccentric and calcified. RCA: Dominant. Mild diffuse disease again noted. Mid segment after the origin of RV branch has a 70 to 80% stenosis. Brisk flow is evident throughout the RCA. Impression: Findings consistent with nonischemic cardiomyopathy. Although she has significant disease in the right coronary artery, this does not explain her presentation with global hypokinesis, neither does it explain VF  arrest as the lesion does not appear to be unstable.  Carotid duplex: 08/03/2019:Right Carotid: Velocities in the right ICA are consistent with a 1-39% stenosis. Left Carotid: Velocities in the left ICA are consistent with a 1-39% stenosis. Vertebrals: Bilateral vertebral arteries demonstrate antegrade flow. Subclavians: Normal flow hemodynamics were seen in bilateral subclavian arteries.  Assessment & Recommendations: Status post cardiac arrest secondary to ventricular fibrillation. Electrophysiology team consulted plans for CRT-D implantation later today.  This is in the setting of V. fib arrest and underlying left bundle branch block. Telemetry reviewed. Patient remains hemodynamically stable. Currently on amiodarone drip, would appreciate recommendations for EP in regards to the need for transition to oral or discontinuation  Hypokalemia: Improving Morning labs reviewed.  Potassium currently being replaced.  Chronic heart failure with reduced EF, stage C, NYHA class II: Continue current medical therapy  and will uptitrate if hemodynamics and laboratory values allow.  Nonischemic cardiomyopathy: Continue guideline directed medical therapy.  In the setting of cardiac arrest secondary to ventricular fibrillation successfully defibrillated the LifeVest patient is scheduled for CRT-D implantation later today.  History of VT/V. fib arrest.  History of torsades de pointes  History of CVA/TIA.  Left bundle branch block.  Atrial fibrillation, paroxysmal: Continue Coreg for rate control, on oral anticoagulation as outpatient for thromboembolic prophylaxis, and currently on IV amiodarone drip.   On long-term oral anticoagulation: Currently held secondary to upcoming procedure.  Mixed hyperlipidemia.  Did not want to be on statin therapy.  She was encouraged at the last office visit and was started on Crestor 10 mg p.o. nightly.  Currently on Crestor and Zetia.  Benign essential  hypertension with chronic kidney disease stage III: Continue hypertensive medications, monitor BUN and creatinine.  Former smoker.  Evaluation and management (66min) with time spent obtaining history, performing exam, reviewing labs, studies, greater than 50% of that time was spent in counseling and coordination care with the patient regarding complex decision making and discussion as state above, and documenting clinical evaluation.  Rex Kras, DO, [facility]. [place] Office: [phone]

## 2019-08-25 NOTE — Progress Notes (Signed)
To EP procedure room from [facility] .  Consent signed.

## 2019-08-25 NOTE — H&P (Signed)
ICD Criteria  Current LVEF:25-30%. Within 12 months prior to implant: Yes   Heart failure history: Yes, Class II  Cardiomyopathy history: Yes, Non-Ischemic Cardiomyopathy.  Atrial Fibrillation/Atrial Flutter: No.  Ventricular tachycardia history: No.  Cardiac arrest history: Yes, Ventricular Fibrillation.  History of syndromes with risk of sudden death: No.  Previous ICD: No.  Current ICD indication: Secondary  PPM indication: No.  Class I or II Bradycardia indication present: No  Beta Blocker therapy for 3 or more months: Yes, prescribed.   Ace Inhibitor/ARB therapy for 3 or more months: Yes, prescribed.    I have seen [name] is a 69 y.o. femalepre-procedural and has been referred by [facility] for consideration of ICD implant for secondary prevention of sudden death.  The patient's chart has been reviewed and they meet criteria for ICD implant.  I have had a thorough discussion with the patient reviewing options.  The patient and their family (if available) have had opportunities to ask questions and have them answered. The patient and I have decided together through the [place] Support Tool to implant ICD at this time.  Risks, benefits, alternatives to ICD implantation were discussed in detail with the patient today. The patient  understands that the risks include but are not limited to bleeding, infection, pneumothorax, perforation, tamponade, vascular damage, renal failure, MI, stroke, death, inappropriate shocks, and lead dislodgement and  wishes to proceed.

## 2019-08-25 NOTE — Progress Notes (Addendum)
Electrophysiology Rounding Note  Patient Name: [name] Date of Encounter: 08/25/2019  Primary Cardiologist: Einar Gip Electrophysiologist: Curt Bears   Subjective   The patient is doing well today.  At this time, the patient denies chest pain, shortness of breath. +itching from pads   Inpatient Medications    Scheduled Meds: . aspirin EC  81 mg Oral Daily  . carvedilol  6.25 mg Oral BID WC  . ezetimibe  10 mg Oral Daily  . gentamicin irrigation  80 mg Irrigation On Call  . nicotine  21 mg Transdermal Q24H  . pantoprazole  40 mg Oral Q1200  . potassium chloride  40 mEq Oral Once  . rosuvastatin  10 mg Oral q1800  . sacubitril-valsartan  1 tablet Oral BID  . spironolactone  12.5 mg Oral Daily   Continuous Infusions: . sodium chloride 50 mL/hr at 08/24/19 1746  . amiodarone 30 mg/hr (08/24/19 2244)  .  ceFAZolin (ANCEF) IV     PRN Meds: docusate sodium, loratadine, nicotine polacrilex   Vital Signs    Vitals:   08/24/19 2006 08/24/19 2155 08/25/19 0029 08/25/19 0424  BP: (!) 113/52 (!) 150/64 134/61 136/63  Pulse: 64 61 61 63  Resp: 20  18 18   Temp: 98.4 F (36.9 C)  98 F (36.7 C) 98 F (36.7 C)  TempSrc: Oral  Oral Oral  SpO2: 91% 91% 92% 90%  Weight:    72.3 kg  Height:        Intake/Output Summary (Last 24 hours) at 08/25/2019 0817 Last data filed at 08/25/2019 0300 Gross per 24 hour  Intake 1317.37 ml  Output 900 ml  Net 417.37 ml   Filed Weights   08/24/19 0502 08/25/19 0424  Weight: 72.3 kg 72.3 kg    Physical Exam    GEN- The patient is well appearing, alert and oriented x 3 today.   Head- normocephalic, atraumatic Eyes-  Sclera clear, conjunctiva pink Ears- hearing intact Oropharynx- clear Neck- supple Lungs- Clear to ausculation bilaterally, normal work of breathing Heart- Regular rate and rhythm  GI- soft, NT, ND, + BS Extremities- no clubbing, cyanosis, or edema Skin- no rash or lesion Psych- euthymic mood, full affect Neuro-  strength and sensation are intact  Labs    CBC Recent Labs    08/24/19 0512 08/24/19 0512 08/24/19 0535 08/25/19 0501  WBC 5.6  --   --  6.1  NEUTROABS 3.8  --   --   --   HGB 12.0   < > 10.5* 12.7  HCT 36.6   < > 31.0* 38.9  MCV 89.9  --   --  89.2  PLT 219  --   --  261   < > = values in this interval not displayed.   Basic Metabolic Panel Recent Labs    08/24/19 0942 08/24/19 0942 08/24/19 2035 08/25/19 0501  NA 140   < > 141 145  K 4.1   < > 3.3* 3.5  CL 106   < > 108 107  CO2 25   < > 25 26  GLUCOSE 131*   < > 157* 108*  BUN 8   < > 8 8  CREATININE 1.17*   < > 1.18* 1.10*  CALCIUM 8.3*   < > 8.5* 8.7*  MG 2.3  --  2.1  --    < > = values in this interval not displayed.   Thyroid Function Tests Recent Labs    08/24/19 0942  TSH 0.550  Telemetry    SR (personally reviewed)  Radiology    DG Chest Portable 1 View  Result Date: 08/24/2019 CLINICAL DATA:  Life vest actuation EXAM: PORTABLE CHEST 1 VIEW COMPARISON:  08/06/2019 FINDINGS: The heart size and mediastinal contours are within normal limits. Both lungs are clear. Dense calcified pulmonary nodule of the right upper lobe. The visualized skeletal structures are unremarkable. IMPRESSION: No acute abnormality of the lungs in AP portable projection. Electronically Signed   By: Eddie Candle M.D.   On: 08/24/2019 08:18     Patient Profile     [name] is a 68 y.o. female with a history of CVA, VT/VF arrest, torsades, NICM, PVD, LBBB HLD, and HTN who is being seen today for the evaluation of lifevest discharge/VF at the request of Dr. Einar Gip.  Assessment & Plan    1.  VF With appropriate LifeVest therapy With underlying LBBB, plan for CRTD implant today. Dr Curt Bears reviewed risks, benefits of the procedure, pt wishes to proceed. Khayri Kargbo plan for later today Keep K >3.5, Mg>1.9 (Jhana Giarratano supplement K+ this morning) No driving x6 months Continue amiodarone drip for now - Midge Momon discuss with Dr Curt Bears  timing of discontinuation  2.  Chronic systolic heart failure Stable No change required today  3.  Paroxysmal atrial fibrillation Nataleigh Griffin follow burden through device CHADS2VASC is 6 Continue Eliquis long term - hold for procedure today       For questions or updates, please contact [place] Please consult [email or web address] for contact info under Cardiology/STEMI.  Signed, Chanetta Marshall, NP  08/25/2019, 8:17 AM   I have seen and examined this patient with Chanetta Marshall.  Agree with above, note added to reflect my findings.  On exam, RRR, no murmurs, lungs clear. No further V arrhythmias. K remains low and to be repleted. Need to keep K>4. Has wide LBBB and thus Alahna Dunne plan for CRT-D implant today.    Lasonja Lakins M. Artice Bergerson MD 08/25/2019 9:31 AM

## 2019-08-26 ENCOUNTER — Other Ambulatory Visit: Payer: Self-pay | Admitting: Cardiology

## 2019-08-26 ENCOUNTER — Telehealth: Payer: Self-pay

## 2019-08-26 ENCOUNTER — Telehealth: Payer: Self-pay | Admitting: Cardiology

## 2019-08-26 ENCOUNTER — Inpatient Hospital Stay (HOSPITAL_COMMUNITY): Payer: Medicare HMO

## 2019-08-26 ENCOUNTER — Ambulatory Visit: Payer: Medicare HMO | Admitting: Cardiology

## 2019-08-26 DIAGNOSIS — I480 Paroxysmal atrial fibrillation: Secondary | ICD-10-CM

## 2019-08-26 DIAGNOSIS — Z95810 Presence of automatic (implantable) cardiac defibrillator: Secondary | ICD-10-CM

## 2019-08-26 DIAGNOSIS — E876 Hypokalemia: Secondary | ICD-10-CM

## 2019-08-26 LAB — BASIC METABOLIC PANEL
Anion gap: 12 (ref 5–15)
BUN: 9 mg/dL (ref 8–23)
CO2: 22 mmol/L (ref 22–32)
Calcium: 9 mg/dL (ref 8.9–10.3)
Chloride: 105 mmol/L (ref 98–111)
Creatinine, Ser: 1.14 mg/dL — ABNORMAL HIGH (ref 0.44–1.00)
GFR calc Af Amer: 57 mL/min — ABNORMAL LOW (ref >60)
GFR calc non Af Amer: 49 mL/min — ABNORMAL LOW (ref >60)
Glucose, Bld: 138 mg/dL — ABNORMAL HIGH (ref 70–99)
Potassium: 4 mmol/L (ref 3.5–5.1)
Sodium: 139 mmol/L (ref 135–145)

## 2019-08-26 LAB — CBC
HCT: 39.7 % (ref 36.0–46.0)
Hemoglobin: 13.1 g/dL (ref 12.0–15.0)
MCH: 29 pg (ref 26.0–34.0)
MCHC: 33 g/dL (ref 30.0–36.0)
MCV: 87.8 fL (ref 80.0–100.0)
Platelets: 219 10*3/uL (ref 150–400)
RBC: 4.52 MIL/uL (ref 3.87–5.11)
RDW: 13.1 % (ref 11.5–15.5)
WBC: 7.2 10*3/uL (ref 4.0–10.5)
nRBC: 0 % (ref 0.0–0.2)

## 2019-08-26 LAB — APTT: aPTT: 32 seconds (ref 24–36)

## 2019-08-26 MED ORDER — POTASSIUM CHLORIDE ER 20 MEQ PO TBCR: 20.0000 meq | EXTENDED_RELEASE_TABLET | Freq: Every day | ORAL | 0 refills | Status: DC

## 2019-08-26 MED ORDER — SILVER SULFADIAZINE 1 % EX CREA: TOPICAL_CREAM | CUTANEOUS | 0 refills | Status: DC

## 2019-08-26 MED ORDER — SODIUM CHLORIDE 0.9 % IV SOLN
INTRAVENOUS | Status: DC | PRN
  Administered 2019-08-26: 250 mL via INTRAVENOUS

## 2019-08-26 MED FILL — POTASSIUM CHLORIDE 20meqER: 20 | 30 days supply | Qty: 30 | Fill #0

## 2019-08-26 MED FILL — SILVADENE 1% CREAM: 1 | 20 days supply | Qty: 85 | Fill #0

## 2019-08-26 MED FILL — Gentamicin Sulfate Inj 40 MG/ML: INTRAMUSCULAR | Qty: 80 | Status: AC

## 2019-08-26 MED FILL — Cefazolin Sodium-Dextrose IV Solution 2 GM/100ML-4%: INTRAVENOUS | Qty: 100 | Status: AC

## 2019-08-26 MED FILL — Lidocaine HCl Local Inj 1%: INTRAMUSCULAR | Qty: 60 | Status: AC

## 2019-08-26 NOTE — TOC Transition Note (Addendum)
Transition of Care [facility]) - CM/SW Discharge Note   Patient Details  Name: [name] MRN: [id number] Date of Birth: September 29, 1950  [facility] CM/SW Contact:  Zenon Mayo, RN Phone Number: 08/26/2019, 12:21 PM   Clinical Narrative:    Patient is for dc today, per pt eval rec outpatient physical therapy when cleared by EP per MD recs,  NCM made referral thru epic to outpatient physical therapy for one month out when patient is cleared by EP MD.  NCM informed patient to call outpatient physical therapy once the EP doctor has cleared her to go to physical therapy, she states she understands.    Final next level of care: OP Rehab Barriers to Discharge: No Barriers Identified   Patient Goals and CMS Choice Patient states their goals for this hospitalization and ongoing recovery are:: get better   Choice offered to / list presented to : NA  Discharge Placement                       Discharge Plan and Services                DME Arranged: (NA)         HH Arranged: NA          Social Determinants of Health (SDOH) Interventions     Readmission Risk Interventions No flowsheet data found.

## 2019-08-26 NOTE — Discharge Summary (Signed)
Physician Discharge Summary  Patient ID: ISAMAR NAZIR MRN: [id number] DOB/AGE: August 24, 1950 69 y.o.  Admit date: 08/24/2019 Discharge date: 08/26/2019   Consultants: Dr. Allegra Lai, cardiac electrophysiology  Primary Discharge Diagnosis: Status post cardiac arrest, secondary to ventricular fibrillation. Hypokalemia  Secondary Discharge Diagnosis: Nonischemic cardiomyopathy. Heart failure with reduced EF, stage C, NYHA class II Left bundle branch block History of VT/V. fib arrest. History of torsades de pointes History of CVA/TIA. Atrial fibrillation, paroxysmal On long-term oral anticoagulation Mixed hyperlipidemia. Benign essential hypertension with chronic kidney disease stage III. Former smoker.  Procedure performed: Biventricular ICD implantation  Hospital Course:   69 y.o. Caucasian female who presents to the hospital with chief complaint of "life vest went off." Patient's past medical history and cardiovascular risk factors include hypertension, hyperlipidemia, former tobacco use, history of VT/VF, cardiac arrest, torsades the point, nonischemic cardiomyopathy, peripheral vascular disease, premature fibrillation, left bundle branch block, history of TIA/CVA.  LifeVest was interrogated and her underlying rhythm was ventricular fibrillation.  Patient was stabilized in the ER and transferred to medical floors and was seen by electrophysiology given her recent cardiac arrest secondary to ventricular fibrillation.  Patient remained stable on telemetry and underwent placement of a BiV ICD implantation.  No apparent acute complications noted.  Patient was stable overnight.  And remains in good spirits.  Patient would like to be discharged home later today if medically stable.  Patient was seen by physical therapy who have recommended outpatient PT therapy.   Discharge Exam: Blood pressure 134/60, pulse 69, temperature 98.5 F (36.9 C), temperature source Oral, resp. rate 19,  height 5\' 4"  (1.626 m), weight 71.4 kg, SpO2 95 %.  CONSTITUTIONAL: No acute distress, hemodynamically stable.   SKIN: Skin is warm and dry. No rash noted. No cyanosis. No pallor. No jaundice HEAD: Normocephalic and atraumatic.  EYES: No scleral icterus MOUTH/THROAT: Moist oral membranes.  NECK: No JVD present. No thyromegaly noted.  LYMPHATIC: No visible cervical adenopathy.  CHEST Normal respiratory effort. No intercostal retractions.  Cardiac device noted in the left infraclavicular region.  Posterior thorax which was covered by the LifeVest is erythematous (improving). LUNGS:Clear to auscultation bilaterally.No stridor. No wheezes. No rales.  CARDIOVASCULAR:Regular, positive V4-Q5, soft holosystolic murmur heard at the apex radiating to axilla, no gallops or rubs appreciated ABDOMINAL:Nonobese, soft, nontender, nondistended, positive bowel sounds in all 4 quadrants no apparent ascites.  EXTREMITIES: No peripheral edema.2+ right femoral pulse, 1+ left femoral pulse, none palpable bilateral popliteal, dorsalis pedis or posterior tibial pulses. Warm to touch bilaterally. No discoloration or cyanosis present. HEMATOLOGIC: No significant bruising NEUROLOGIC: Oriented to person, place, and time. Nonfocal. Normal muscle tone.  PSYCHIATRIC: Normal mood and affect. Normal behavior. Cooperative  Recommendations on discharge:  Continue medications as reconciled. Patient is encouraged to follow-up with her outpatient follow-up appointments. Check BMP and magnesium level on Monday, August 29, 2019. Medications to be delivered room prior to discharge.  Labs: Lab Results  Component Value Date   WBC 7.2 08/26/2019   HGB 13.1 08/26/2019   HCT 39.7 08/26/2019   MCV 87.8 08/26/2019   PLT 219 08/26/2019    Recent Labs  Lab 08/26/19 0814  NA 139  K 4.0  CL 105  CO2 22  BUN 9  CREATININE 1.14*  CALCIUM 9.0  GLUCOSE 138*    Lipid Panel     Component Value Date/Time   CHOL 232 (H)  08/03/2019 0249   TRIG 128 08/03/2019 0249   HDL 31 (L) 08/03/2019 0249  CHOLHDL 7.5 08/03/2019 0249   VLDL 26 08/03/2019 0249   LDLCALC 175 (H) 08/03/2019 0249    BNP (last 3 results) Recent Labs    08/24/19 0512  BNP 617.4*    HEMOGLOBIN A1C Lab Results  Component Value Date   HGBA1C 5.7 (H) 08/03/2019   MPG 116.89 08/03/2019    Cardiac Panel (last 3 results) No results for input(s): CKTOTAL, CKMB, TROPONINI, RELINDX in the last 8760 hours.  No results found for: CKTOTAL, CKMB, CKMBINDEX, TROPONINI   TSH Recent Labs    08/23/19 1315 08/24/19 0942  TSH 0.75 0.550    Radiology: DG Chest 2 View  Result Date: 08/26/2019 CLINICAL DATA:  Cardiac device implant EXAM: CHEST - 2 VIEW COMPARISON:  08/24/2019 FINDINGS: Interval placement of left chest multi lead pacer defibrillator. Both lungs are clear. Large, densely calcified nodule of the right upper lobe. The visualized skeletal structures are unremarkable. IMPRESSION: Interval placement of left chest multi lead pacer defibrillator. No acute abnormality of the lungs. Electronically Signed   By: Eddie Candle M.D.   On: 08/26/2019 08:41   DG Abd 1 View  Result Date: 08/04/2019 CLINICAL DATA:  OG tube placement EXAM: ABDOMEN - 1 VIEW COMPARISON:  None. FINDINGS: Enteric tube tip overlies the distal stomach. Endotracheal tube is now 3 cm above the carina. Unremarkable bowel gas pattern. IMPRESSION: Enteric tube tip overlies distal stomach. Electronically Signed   By: Macy Mis M.D.   On: 08/04/2019 08:49   CT HEAD WO CONTRAST  Result Date: 08/04/2019 CLINICAL DATA:  Neuro deficit with stroke suspected EXAM: CT HEAD WITHOUT CONTRAST TECHNIQUE: Contiguous axial images were obtained from the base of the skull through the vertex without intravenous contrast. 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 No evidence of infarct progression or hemorrhagic  transformation. Suboccipital craniectomy. No hydrocephalus or masslike finding. History of Chiari malformation. Vascular: No hyperdense vessel. Skull: Suboccipital craniectomy Sinuses/Orbits: Negative IMPRESSION: 1. No acute finding when compared to MRI 2 days ago. 2. Recent right basal ganglia perforator infarct without hemorrhage or visible progression. Electronically Signed   By: Monte Fantasia M.D.   On: 08/04/2019 11:04   CT Head Wo Contrast  Result Date: 08/02/2019 CLINICAL DATA:  Transient ischemic attack (TIA). Maxillofacial pain. Additional history provided: Patient presents for left-sided facial pressure and dizziness, pressure feeling radiates to left side of face and to ear. EXAM: CT HEAD WITHOUT CONTRAST CT MAXILLOFACIAL WITHOUT CONTRAST TECHNIQUE: Contiguous axial images were obtained from the base of the skull through the vertex without intravenous contrast. Multidetector CT imaging of the maxillofacial structures was performed. Multiplanar CT image reconstructions were also generated. A small metallic BB was placed on the right temple in order to reliably differentiate right from left. COMPARISON:  No pertinent prior studies available for comparison. FINDINGS: CT HEAD FINDINGS Brain: Streak artifact limits evaluation of the inferomedial cerebellum. There is no evidence of acute intracranial hemorrhage. No demarcated cortical infarction. No evidence of intracranial mass. No midline shift or extra-axial fluid collection. There is a small region of ill-defined hypodensity the right frontal lobe white matter (series 4, images 20-24). Age-indeterminate lacunar infarct within the right lentiform nucleus/external capsule (series 4, images 16 and 17). Small chronic lacunar infarct within the right caudate. 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 Mild generalized parenchymal atrophy. Vascular: No hyperdense vessel.  Atherosclerotic calcifications Skull: Sequela of prior posterior  fossa decompression, including a postsurgical defect  within the C1 posterior arch. No calvarial fracture or aggressive osseous lesion. CT MAXILLOFACIAL FINDINGS Osseous: No maxillofacial fracture. Small bony exostosis along the superficial aspect of the posterior right mandibular body (series 5, image 29). Orbits: No abnormality identified. Sinuses: No significant paranasal sinus disease. Soft tissues: Carotid artery calcified plaque. The visualized maxillofacial and upper neck soft tissues are otherwise unremarkable. Streak artifact from dental restoration significant limits evaluation of the oral cavity and also somewhat limits evaluation of the oropharynx. Within this limitation, there is no appreciable mass or swelling within the oral cavity, pharynx or larynx. Other: The temporomandibular joints are unremarkable. Left mastoid effusion. IMPRESSION: CT head: 1. Age-indeterminate lacunar infarct within the right lentiform nucleus/external capsule. 2. Small focus of ill-defined hypoattenuation within the right frontal lobe subcortical white matter which may reflect chronic ischemic change. A recent white matter infarct at this site cannot be excluded. 3. Chronic lacunar infarct within the right caudate. 4. 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 5. Mild generalized parenchymal atrophy. CT maxillofacial: 1. Streak artifact from dental restoration limits evaluation of the oral cavity and oropharynx. Within this limitation, no swelling or discrete mass is appreciated within the oral cavity or pharynx. 2. Small left mastoid effusion. Electronically Signed   By: Kellie Simmering DO   On: 08/02/2019 18:50   MR ANGIO HEAD WO CONTRAST  Result Date: 08/02/2019 CLINICAL DATA:  Left-sided facial numbness EXAM: MRI HEAD WITHOUT CONTRAST MRA HEAD WITHOUT CONTRAST TECHNIQUE: Multiplanar, multiecho pulse sequences of the brain and surrounding structures were obtained without intravenous contrast.  Angiographic images of the head were obtained using MRA technique without contrast. COMPARISON:  08/02/2019 FINDINGS: MRI HEAD FINDINGS Brain: There is an area of abnormal diffusion restriction within the right basal ganglia. Multifocal white matter hyperintensity, most commonly due to chronic ischemic microangiopathy. Normal volume of CSF spaces. No chronic microhemorrhage. Normal midline structures. Vascular: Normal flow voids. Skull and upper cervical spine: Normal marrow signal. Sinuses/Orbits: Small amount of left mastoid fluid. Sinuses are clear. Normal orbits. Other: None MRA HEAD FINDINGS POSTERIOR CIRCULATION: --Vertebral arteries: Normal V4 segments. --Posterior inferior cerebellar arteries (PICA): Patent origins from the vertebral arteries. --Anterior inferior cerebellar arteries (AICA): Patent origins from the basilar artery. --Basilar artery: Normal. --Superior cerebellar arteries: Normal. --Posterior cerebral arteries: Normal. Both originate from the basilar artery. Posterior communicating arteries (p-comm) are diminutive or absent. ANTERIOR CIRCULATION: --Intracranial internal carotid arteries: Normal. --Anterior cerebral arteries (ACA): Normal. Both A1 segments are present. Patent anterior communicating artery (a-comm). --Middle cerebral arteries (MCA): Normal. IMPRESSION: 1. 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 No hemorrhage or mass effect. 2. Normal intracranial MRA. Electronically Signed   By: Ulyses Jarred M.D.   On: 08/02/2019 22:02   MR BRAIN WO CONTRAST  Result Date: 08/15/2019 CLINICAL DATA:  Focal neuro deficit, greater than 6 hours, stroke suspected. Additional history provided CHIEF COMPLAINT: Right-sided weakness.  Right-sided weakness. EXAM: MRI HEAD WITHOUT CONTRAST TECHNIQUE: Multiplanar, multiecho pulse sequences of the brain and surrounding structures were obtained without intravenous contrast. COMPARISON:  Noncontrast head CT 08/14/2019, brain MRI/MRA 08/02/2019.  FINDINGS: Brain: Again demonstrated is abnormal restricted diffusion within the right basal ganglia and radiating white matter tracts consistent with acute/subacute infarction. Restricted diffusion is slightly increased in extent along the posterior aspect of the infarction territory as compared to brain MRI 08/02/2019. Corresponding T2/FLAIR hyperintensity at this site. No significant mass effect, effacement of the ventricular system or midline shift. No acute infarct is demonstrated elsewhere within the brain.  Redemonstrated patchy T2/FLAIR hyperintensity within the cerebral white matter which is nonspecific, but consistent with chronic small vessel ischemic disease. Redemonstrated chronic lacunar infarcts within bilateral basal ganglia. No evidence of intracranial mass. No extra-axial fluid collection. No chronic intracranial blood products. Cerebral volume is normal for age. Redemonstrated Chiari I malformation with sequela of prior posterior fossa decompression. Vascular: Sequela prior posterior Skull and upper cervical spine: Sequela of prior Chiari decompression. No focal marrow lesion. Sinuses/Orbits: Visualized orbits demonstrate no acute abnormality. Minimal ethmoid sinus mucosal thickening left mastoid effusion. Trace fluid also present within right mastoid air cells. IMPRESSION: Acute/subacute infarction changes again demonstrated within the right basal ganglia and radiating white matter tracts. Infarction changes along the posterior aspect of the infarction territory have slightly increased in extent from MRI 08/02/2019. No new acute infarct demonstrated elsewhere within the brain. Stable chronic small vessel ischemic disease with chronic bilateral basal ganglia lacunar infarcts. Redemonstrated Chiari I malformation with sequela of prior Chiari decompression. Left mastoid effusion. Electronically Signed   By: Kellie Simmering DO   On: 08/15/2019 08:09   MR BRAIN WO CONTRAST  Result Date:  08/02/2019 CLINICAL DATA:  Left-sided facial numbness EXAM: MRI HEAD WITHOUT CONTRAST MRA HEAD WITHOUT CONTRAST TECHNIQUE: Multiplanar, multiecho pulse sequences of the brain and surrounding structures were obtained without intravenous contrast. Angiographic images of the head were obtained using MRA technique without contrast. COMPARISON:  08/02/2019 FINDINGS: MRI HEAD FINDINGS Brain: There is an area of abnormal diffusion restriction within the right basal ganglia. Multifocal white matter hyperintensity, most commonly due to chronic ischemic microangiopathy. Normal volume of CSF spaces. No chronic microhemorrhage. Normal midline structures. Vascular: Normal flow voids. Skull and upper cervical spine: Normal marrow signal. Sinuses/Orbits: Small amount of left mastoid fluid. Sinuses are clear. Normal orbits. Other: None MRA HEAD FINDINGS POSTERIOR CIRCULATION: --Vertebral arteries: Normal V4 segments. --Posterior inferior cerebellar arteries (PICA): Patent origins from the vertebral arteries. --Anterior inferior cerebellar arteries (AICA): Patent origins from the basilar artery. --Basilar artery: Normal. --Superior cerebellar arteries: Normal. --Posterior cerebral arteries: Normal. Both originate from the basilar artery. Posterior communicating arteries (p-comm) are diminutive or absent. ANTERIOR CIRCULATION: --Intracranial internal carotid arteries: Normal. --Anterior cerebral arteries (ACA): Normal. Both A1 segments are present. Patent anterior communicating artery (a-comm). --Middle cerebral arteries (MCA): Normal. IMPRESSION: 1. 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 No hemorrhage or mass effect. 2. Normal intracranial MRA. Electronically Signed   By: Ulyses Jarred M.D.   On: 08/02/2019 22:02   CARDIAC CATHETERIZATION  Result Date: 08/04/2019 Left Heart Catheterization 08/04/19: LV: Global hypokinesis, upper limit of normal size, EF 25 to 30%. Left main: Normal. LAD: Mild diffuse disease.   Proximal LAD has a 30 to 40% stenosis, mid segment has a 20 to 30% stenosis, scattered disease noted in the LAD.  Brisk flow. Circumflex: Again scattered disease noted in the circumflex.  Mid segment has at most a 30 to 40% stenosis which appears to be eccentric and calcified. RCA: Dominant.  Mild diffuse disease again noted.  Mid segment after the origin of RV branch has a 70 to 80% stenosis.  Brisk flow is evident throughout the RCA. Impression: Findings consistent with nonischemic cardiomyopathy.  Although she has significant disease in the right coronary artery, this does not explain her presentation with global hypokinesis, neither does it explain VF arrest as the lesion does not appear to be unstable. 35 mL contrast used.   EP PPM/ICD IMPLANT  Result Date: 08/25/2019 SURGEON: Allegra Lai, MD PREPROCEDURE DIAGNOSES: 1.  Nonischemic cardiomyopathy 2. Polymorphic VT/VF 3. New York Heart Association class II, heart failure chronically. 4. Left bundle-branch block. POSTPROCEDURE DIAGNOSES: 1. Nonischemic cardiomyopathy. 2. Polymorphic VT/VF 3. New York Heart Association class II heart failure chronically. 4. Left bundle-branch block. PROCEDURES: 1. Left upper extremity venography 2. Biventricular ICD implantation. INTRODUCTION:  TYSHA GRISMORE is a 69 y.o. female with a nonischemic CM (EF 25-30%), NYHA Class III CHF, and LBBB QRS morophology. She is on optimal medical therapy. She presented to the hospital after receiving a shock by her life vest for polymorphic VT/VF. She has a LBBB and thus presents for CRT-D implant DESCRIPTION OF PROCEDURE: Informed written consent was obtained and the patient was brought to the electrophysiology lab in the fasting state. The patient was adequately sedated with intravenous Versed, and fentanyl as outlined in the nursing report. The patient's left chest was prepped and draped in the usual sterile fashion by the EP lab staff. The skin overlying the left deltopectoral region  was infiltrated with lidocaine for local analgesia. A 5-cm incision was made over the left deltopectoral region. A left subcutaneous defibrillator pocket was fashioned using a combination of sharp and blunt dissection. Electrocautery was used to assure hemostasis. RA/RV Lead Placement: The left axillary vein was cannulated with fluoroscopic visualization. No contrast was required for this endeavor. Through the left axillary vein, a Medtronic model [id number] (serial # [id number] ) right atrial lead and a Medtronic model [id number] (serial number [id number]) right ventricular defibrillator lead were advanced with fluoroscopic visualization into the right atrial appendage and right ventricular apex positions respectively. Initial atrial lead P-waves measured 2.8  mV with an impedance of 617 ohms and a threshold of 1.4 volts at 0.5 milliseconds. The right ventricular lead R-wave measured 17 mV with impedance of 760 ohms and a threshold of 0.6 volts at 0.5 milliseconds. LV Lead Placement: A Medtronic MB-2 guide was advanced through the left axillary vein into the low lateral right atrium. A Bard curved Damato catheter was introduced through the MB-2 guide and used to cannulate the coronary sinus. Coronary sinus cannulation was confirmed with electrogram recording from the hexapolar catheter. A selective coronary sinus venogram was performed by hand injection of nonionic contrast. This demonstrated a large CS body with very small/ atretic distal branches. There was a moderate sized lateral coronary sinus branch was noted along the mid portion of the CS body. No other posterior branches were identified. A Whisper CSJ wire was introduced through the transseptal sheath and advanced into the distal posterolateral branch. A Medtronic model 4598 - 88 (serial number [id number] ) lead was advanced through the MB-2 into the lateral branch. This was approximately one-thirds from the base to the apex in a very lateral position. In this  location, the left ventricular lead R-waves measured 11.7 mV with impedance of 456 ohms and a threshold of 1 volt at 0.5 milliseconds in the bipolar LV3-LV4 configuration with no diaphragmatic stimulation observed when pacing at 10 volts output. The MB-2 guide was therefore removed. All three leads were secured to the pectoralis fascia using #2 silk suture over the suture sleeves. The pocket then irrigated with copious gentamicin solution. The leads were then connected to a [place] Quad CRT-D SureScan (serial Number [id number] ) device. The defibrillator was placed into the pocket. The pocket was then closed in 3 layers with 2.0 Vicryl suture for the subcutaneous and 3.0 Vicryl suture subcuticular layers. Steri-Strips and a sterile dressing were then applied. DFT testing was not  performed today. The procedure was therefore considered completed. EBL<35ml. There were no early apparhent complications. CONCLUSIONS: 1. Nonischemic cardiomyopathy with Left bundle-branch block and chronic New York Heart Association class III heart failure. 2. Successful biventricular ICD implantation. 3. No early apparent complications.   DG Chest Portable 1 View  Result Date: 08/24/2019 CLINICAL DATA:  Life vest actuation EXAM: PORTABLE CHEST 1 VIEW COMPARISON:  08/06/2019 FINDINGS: The heart size and mediastinal contours are within normal limits. Both lungs are clear. Dense calcified pulmonary nodule of the right upper lobe. The visualized skeletal structures are unremarkable. IMPRESSION: No acute abnormality of the lungs in AP portable projection. Electronically Signed   By: Eddie Candle M.D.   On: 08/24/2019 08:18   DG Chest Port 1 View  Result Date: 08/06/2019 CLINICAL DATA:  Status update EXAM: PORTABLE CHEST 1 VIEW COMPARISON:  Two days ago FINDINGS: Extubation with stable inflation. Long-standing calcified density over the right scapula, seen since 2010 at least. Generous heart size. There is no  edema, consolidation, effusion, or pneumothorax. IMPRESSION: Clear lungs after extubation. Electronically Signed   By: Monte Fantasia M.D.   On: 08/06/2019 07:27   DG CHEST PORT 1 VIEW  Result Date: 08/04/2019 CLINICAL DATA:  Respiratory arrest EXAM: PORTABLE CHEST 1 VIEW COMPARISON:  02/10/2017 FINDINGS: Endotracheal tube with tip 10 mm above the carina. Symmetric low volume lungs with mild interstitial coarsening. There is no edema, consolidation, effusion, or pneumothorax. Normal heart size and mediastinal contours. IMPRESSION: 1. Endotracheal tube with tip 10 mm above the carina. 2. Symmetric low volume lungs. Electronically Signed   By: Monte Fantasia M.D.   On: 08/04/2019 07:11   DG Swallowing Func-Speech Pathology  Result Date: 08/05/2019 Objective Swallowing Evaluation: Type of Study: MBS-Modified Barium Swallow Study  Patient Details Name: SHIRI HODAPP MRN: [id number] Date of Birth: June 07, 1951 Today's Date: 08/05/2019 Time: SLP Start Time (ACUTE ONLY): 2585 -SLP Stop Time (ACUTE ONLY): 1100 SLP Time Calculation (min) (ACUTE ONLY): 18 min Past Medical History: Past Medical History: Diagnosis Date . Allergy  . Anxiety  . Arnold-Chiari malformation ([place])  . Arthritis  . GERD (gastroesophageal reflux disease)  . H. pylori infection   3 years ago . Hyperlipidemia  . Incontinence  . Syncope and collapse   Per pt, denies passing out . Tobacco use disorder  . Unspecified essential hypertension  Past Surgical History: Past Surgical History: Procedure Laterality Date . APPENDECTOMY   . ARNOLD CHIARI SURGERY    neurocranial surgery . BLEPHAROPLASTY    Bil . C-EYE SURGERY PROCEDURE   . CHOLECYSTECTOMY   . EYE SURGERY   . LEFT HEART CATH AND CORONARY ANGIOGRAPHY N/A 08/04/2019  Procedure: LEFT HEART CATH AND CORONARY ANGIOGRAPHY;  Surgeon: Adrian Prows, MD;  Location: [facility];  Service: Cardiovascular;  Laterality: N/A; . MASS EXCISION Right 12/27/2013  Procedure: MINOR EXCISION OF RIGHT THUMB MUCOID  CYST, DEBRIDEMENT OF INTERPHALANGEAL JOINT;  Surgeon: Cammie Sickle, MD;  Location: [facility];  Service: Orthopedics;  Laterality: Right; . mass on thumb  right . TOTAL KNEE ARTHROPLASTY Right 02/17/2017 . TOTAL KNEE ARTHROPLASTY Right 02/17/2017  Procedure: TOTAL KNEE ARTHROPLASTY;  Surgeon: Melrose Nakayama, MD;  Location: [facility];  Service: Orthopedics;  Laterality: Right; . TUBAL LIGATION   HPI: CRYSTALYNN MCINERNEY is a 69 y.o. female with medical history significant for anxiety, hypertension, and Chiari I malformation, presented to the emergency department with difficulty swallowing, dizziness, and left facial pressure on 08/02/19. MRI notable for  acute ischemic infarction in the right basal ganglia. ST recommended MBS to be completed 2/28 however morning of Aug 18, 2022 pt had Vfib, coded and intubated for several hours.   Subjective: "I'm not trying to be here too long" Assessment / Plan / Recommendation CHL IP CLINICAL IMPRESSIONS 08/05/2019 Clinical Impression Pt exhibits moderate oropharyngeal dysphagia marked by discoordination and mistiming of swallowing sequence and suspected esophageal disturbance. Orally, barium fell under tongue as she manipulated and propelled but able to clear oral cavity. Timing of epiglottic deflection was late with barium passing epiglottis then pushed into laryngeal vestibule and aspirated with thin and nectar consistencies and penetration with honey thick. She sensed only one of two aspiration episodes. Tucking of chin reduced airway compromise to flash with nectar however pt taking large sips and ability to consistently is questionable. Minimal vallecular residue present. Pt reported symptoms of reflux/esophageal difficulties for "a long time". Esophageal scan revealed barium retention at mid esophagus. Recommend continue Dys 3, downgrade liquids to honey, pills whole in puree, stay upright after meals.     SLP Visit Diagnosis Dysphagia, oropharyngeal phase (R13.12)  Attention and concentration deficit following -- Frontal lobe and executive function deficit following -- Impact on safety and function Moderate aspiration risk   CHL IP TREATMENT RECOMMENDATION 08/05/2019 Treatment Recommendations Therapy as outlined in treatment plan below   Prognosis 08/05/2019 Prognosis for Safe Diet Advancement Good Barriers to Reach Goals -- Barriers/Prognosis Comment -- [facility]L IP DIET RECOMMENDATION 08/05/2019 SLP Diet Recommendations Dysphagia 3 (Mech soft) solids;Honey thick liquids Liquid Administration via Cup Medication Administration Whole meds with puree Compensations Slow rate;Small sips/bites;Clear throat intermittently Postural Changes Remain semi-upright after after feeds/meals (Comment);Seated upright at 90 degrees   CHL IP OTHER RECOMMENDATIONS 08/05/2019 Recommended Consults -- Oral Care Recommendations Oral care BID Other Recommendations Order thickener from pharmacy   CHL IP FOLLOW UP RECOMMENDATIONS 08/05/2019 Follow up Recommendations Other (comment)   CHL IP FREQUENCY AND DURATION 08/05/2019 Speech Therapy Frequency (ACUTE ONLY) min 2x/week Treatment Duration 2 weeks      CHL IP ORAL PHASE 08/05/2019 Oral Phase Impaired Oral - Pudding Teaspoon -- Oral - Pudding Cup -- Oral - Honey Teaspoon -- Oral - Honey Cup Decreased bolus cohesion Oral - Nectar Teaspoon -- Oral - Nectar Cup Decreased bolus cohesion Oral - Nectar Straw -- Oral - Thin Teaspoon -- Oral - Thin Cup Decreased bolus cohesion Oral - Thin Straw -- Oral - Puree -- Oral - Mech Soft -- Oral - Regular (No Data) Oral - Multi-Consistency -- Oral - Pill -- Oral Phase - Comment --  CHL IP PHARYNGEAL PHASE 08/05/2019 Pharyngeal Phase Impaired Pharyngeal- Pudding Teaspoon -- Pharyngeal -- Pharyngeal- Pudding Cup -- Pharyngeal -- Pharyngeal- Honey Teaspoon -- Pharyngeal -- Pharyngeal- Honey Cup Penetration/Aspiration during swallow;Pharyngeal residue - valleculae Pharyngeal Material enters airway, remains ABOVE vocal cords then  ejected out Pharyngeal- Nectar Teaspoon -- Pharyngeal -- Pharyngeal- Nectar Cup Penetration/Aspiration during swallow;Pharyngeal residue - valleculae Pharyngeal Material enters airway, passes BELOW cords without attempt by patient to eject out (silent aspiration) Pharyngeal- Nectar Straw -- Pharyngeal -- Pharyngeal- Thin Teaspoon -- Pharyngeal -- Pharyngeal- Thin Cup Penetration/Aspiration during swallow Pharyngeal Material enters airway, passes BELOW cords and not ejected out despite cough attempt by patient Pharyngeal- Thin Straw -- Pharyngeal -- Pharyngeal- Puree -- Pharyngeal -- Pharyngeal- Mechanical Soft -- Pharyngeal -- Pharyngeal- Regular WFL Pharyngeal -- Pharyngeal- Multi-consistency -- Pharyngeal -- Pharyngeal- Pill -- Pharyngeal -- Pharyngeal Comment --  CHL IP CERVICAL ESOPHAGEAL PHASE 08/05/2019 Cervical Esophageal Phase WFL Pudding Teaspoon --  Pudding Cup -- Honey Teaspoon -- Honey Cup -- Nectar Teaspoon -- Nectar Cup -- Nectar Straw -- Thin Teaspoon -- Thin Cup -- Thin Straw -- Puree -- Mechanical Soft -- Regular -- Multi-consistency -- Pill -- Cervical Esophageal Comment -- [place] Siren 08/05/2019, 1:37 PM Orbie Pyo Colvin Caroli.Ed Actor Pager [phone] Office [phone]              ECHOCARDIOGRAM COMPLETE  Result Date: 08/03/2019    ECHOCARDIOGRAM REPORT   Patient Name:   HERTA HINK Zambrana Date of Exam: 08/03/2019 Medical Rec #:  [id number]      Height:       64.0 in Accession #:    [id number]     Weight:       159.2 lb Date of Birth:  [birth year]      BSA:          1.78 m Patient Age:    69 years       BP:           143/64 mmHg Patient Gender: F              HR:           53 bpm. Exam Location:  Inpatient Procedure: 2D Echo, Cardiac Doppler and Color Doppler Indications:    Stroke 434.91  History:        Patient has no prior history of Echocardiogram examinations.                 Stroke, Arrythmias:LBBB; Risk Factors:Hypertension, Dyslipidemia                 and  Current Smoker. GERD.  Sonographer:    Vickie Epley RDCS Referring Phys: [id number] [place]  1. Severely depressed LVEF with global hypokinesis. No apical thrombus visualized on this study, however would consider repeat limited echo with contrast to exclude LV thrombus. Could also consider TEE for further evaluation of cardiac embolism.  2. Left ventricular ejection fraction, by estimation, is 25 to 30%. The left ventricle has severely decreased function. The left ventricle demonstrates global hypokinesis. There is moderate concentric left ventricular hypertrophy. Left ventricular diastolic parameters are indeterminate. Elevated left ventricular end-diastolic pressure.  3. Right ventricular systolic function is normal. The right ventricular size is normal.  4. The mitral valve is normal in structure and function. Mild mitral valve regurgitation.  5. The aortic valve is tricuspid. Aortic valve regurgitation is not visualized. No aortic stenosis is present.  6. The inferior vena cava is dilated in size with <50% respiratory variability, suggesting right atrial pressure of 15 mmHg. FINDINGS  Left Ventricle: Left ventricular ejection fraction, by estimation, is 25 to 30%. The left ventricle has severely decreased function. The left ventricle demonstrates global hypokinesis. The left ventricular internal cavity size was normal in size. There is moderate concentric left ventricular hypertrophy. Left ventricular diastolic parameters are indeterminate. Elevated left ventricular end-diastolic pressure. Right Ventricle: The right ventricular size is normal. No increase in right ventricular wall thickness. Right ventricular systolic function is normal. Left Atrium: Left atrial size was normal in size. Right Atrium: Right atrial size was normal in size. Pericardium: There is no evidence of pericardial effusion. Mitral Valve: The mitral valve is normal in structure and function. Mild mitral valve regurgitation.  Tricuspid Valve: The tricuspid valve is normal in structure. Tricuspid valve regurgitation is trivial. No evidence of tricuspid stenosis. Aortic Valve: The aortic valve is tricuspid. Aortic valve regurgitation is not  visualized. No aortic stenosis is present. Pulmonic Valve: The pulmonic valve was not well visualized. Pulmonic valve regurgitation is not visualized. No evidence of pulmonic stenosis. Aorta: The aortic root and ascending aorta are structurally normal, with no evidence of dilitation. Venous: The inferior vena cava is dilated in size with less than 50% respiratory variability, suggesting right atrial pressure of 15 mmHg. IAS/Shunts: No atrial level shunt detected by color flow Doppler.  LEFT VENTRICLE PLAX 2D LVIDd:         4.51 cm      Diastology LVIDs:         3.70 cm      LV e' lateral:   3.57 cm/s LV PW:         1.59 cm      LV E/e' lateral: 15.4 LV IVS:        1.47 cm      LV e' medial:    2.61 cm/s LVOT diam:     2.00 cm      LV E/e' medial:  21.1 LV SV:         66.60 ml LV SV Index:   19.08 LVOT Area:     3.14 cm  LV Volumes (MOD) LV vol d, MOD A2C: 208.0 ml LV vol d, MOD A4C: 200.0 ml LV vol s, MOD A2C: 123.0 ml LV vol s, MOD A4C: 132.0 ml LV SV MOD A2C:     85.0 ml LV SV MOD A4C:     200.0 ml LV SV MOD BP:      77.0 ml RIGHT VENTRICLE RV S prime:     8.32 cm/s TAPSE (M-mode): 2.4 cm LEFT ATRIUM             Index       RIGHT ATRIUM           Index LA diam:        3.40 cm 1.92 cm/m  RA Area:     10.80 cm LA Vol (A2C):   52.9 ml 29.80 ml/m RA Volume:   26.70 ml  15.04 ml/m LA Vol (A4C):   41.7 ml 23.49 ml/m LA Biplane Vol: 48.8 ml 27.49 ml/m  AORTIC VALVE LVOT Vmax:   96.00 cm/s LVOT Vmean:  57.600 cm/s LVOT VTI:    0.212 m  AORTA Ao Root diam: 3.10 cm MITRAL VALVE MV Area (PHT): 2.05 cm     SHUNTS MV Decel Time: 370 msec     Systemic VTI:  0.21 m MV E velocity: 55.10 cm/s   Systemic Diam: 2.00 cm MV A velocity: 125.00 cm/s MV E/A ratio:  0.44 Buford Dresser MD Electronically signed  by Buford Dresser MD Signature Date/Time: 08/03/2019/8:56:44 PM    Final    ECHOCARDIOGRAM LIMITED BUBBLE STUDY  Result Date: 08/04/2019    ECHOCARDIOGRAM LIMITED REPORT   Patient Name:   DANE BLOCH Sypher Date of Exam: 08/04/2019 Medical Rec #:  [id number]      Height:       64.0 in Accession #:    [id number]     Weight:       159.0 lb Date of Birth:  [birth year]      BSA:          1.77 m Patient Age:    57 years       BP:           96/63 mmHg Patient Gender: F  HR:           121 bpm. Exam Location:  Inpatient Procedure: Limited Echo, Color Doppler, Cardiac Doppler, Saline Contrast Bubble            Study and Intracardiac Opacification Agent STAT ECHO Indications:    Cardiac Arrest, Stroke  History:        Patient has prior history of Echocardiogram examinations, most                 recent 08/03/2019. Risk Factors:Hypertension and Dyslipidemia.  Sonographer:    Raquel Sarna Senior RDCS Referring Phys: [id number] Candee Furbish  Sonographer Comments: Limited to assess for cardiac source of thrombus or shunting. IMPRESSIONS  1. Left ventricular ejection fraction, by estimation, is 25 to 30%. The left ventricle has severely decreased function. The left ventricle demonstrates global hypokinesis. There was marked septal-lateral dyssynchrony consistent with LBBB. No LV thrombus  noted on Definity study.  2. Right ventricular systolic function is normal. The right ventricular size is normal. There is normal pulmonary artery systolic pressure.  3. The aortic valve is tricuspid. Aortic valve regurgitation is not visualized. No aortic stenosis is present.  4. The inferior vena cava is normal in size with <50% respiratory variability, suggesting right atrial pressure of 8 mmHg.  5. Bubble study appeared negative for shunt lesion but was a difficult study due to poor windows.  6. Limited echo. FINDINGS  Left Ventricle: Left ventricular ejection fraction, by estimation, is 25 to 30%. The left ventricle has severely  decreased function. The left ventricle demonstrates global hypokinesis. Definity contrast agent was given IV to delineate the left ventricular endocardial borders. The left ventricular internal cavity size was normal in size. There is no left ventricular hypertrophy. Abnormal (paradoxical) septal motion, consistent with left bundle branch block. Right Ventricle: The right ventricular size is normal. No increase in right ventricular wall thickness. Right ventricular systolic function is normal. There is normal pulmonary artery systolic pressure. The tricuspid regurgitant velocity is 2.21 m/s, and  with an assumed right atrial pressure of 8 mmHg, the estimated right ventricular systolic pressure is 28.7 mmHg. Left Atrium: Left atrial size was normal in size. Right Atrium: Right atrial size was normal in size. Tricuspid Valve: The tricuspid valve is normal in structure. Tricuspid valve regurgitation is trivial. Aortic Valve: The aortic valve is tricuspid. Aortic valve regurgitation is not visualized. No aortic stenosis is present. Pulmonic Valve: The pulmonic valve was normal in structure. Pulmonic valve regurgitation is not visualized. Venous: The inferior vena cava is normal in size with less than 50% respiratory variability, suggesting right atrial pressure of 8 mmHg. IAS/Shunts: Agitated saline contrast was given intravenously to evaluate for intracardiac shunting.  RIGHT VENTRICLE RV S prime:     9.14 cm/s TAPSE (M-mode): 1.9 cm AORTIC VALVE LVOT Vmax:   75.00 cm/s LVOT Vmean:  53.300 cm/s LVOT VTI:    0.142 m TRICUSPID VALVE TR Peak grad:   19.5 mmHg TR Vmax:        221.00 cm/s  SHUNTS Systemic VTI: 0.14 m Loralie Champagne MD Electronically signed by Loralie Champagne MD Signature Date/Time: 08/04/2019/10:47:14 AM    Final    CT HEAD CODE STROKE WO CONTRAST  Result Date: 08/14/2019 CLINICAL DATA:  Code stroke. Right sided weakness. Speech disturbance. Acute stroke presentation. EXAM: CT HEAD WITHOUT CONTRAST  TECHNIQUE: Contiguous axial images were obtained from the base of the skull through the vertex without intravenous contrast. COMPARISON:  Head CT 08/04/2019.  MRI 08/02/2019.  FINDINGS: Brain: No acute finding affects the brainstem or cerebellum. Previous suboccipital craniectomy. Cerebral hemispheres show subacute infarction in the right lateral basal ganglia and radiating white matter tracts as seen previously. No new acute infarction. No mass lesion, hemorrhage, hydrocephalus or extra-axial collection. Vascular: There is atherosclerotic calcification of the major vessels at the base of the brain. Skull: Otherwise negative Sinuses/Orbits: Clear/normal Other: None ASPECTS ([place] Stroke Program Early CT Score), assuming left hemisphere insult - Ganglionic level infarction (caudate, lentiform nuclei, internal capsule, insula, M1-M3 cortex): 7 - Supraganglionic infarction (M4-M6 cortex): 3 Total score (0-10 with 10 being normal): 10 IMPRESSION: 1. Subacute infarction in the right lateral basal ganglia/radiating white matter tracts as seen previously. No evidence of hemorrhagic transformation or extension. No acute or focal finding seen elsewhere. 2. ASPECTS is 10, assuming left hemispheric insult clinically. 3. These results were communicated to Dr. Cheral Marker at 10:40 pmon 2/28/2021by text page via the [facility] messaging system. Electronically Signed   By: Nelson Chimes M.D.   On: 08/14/2019 22:41   VAS US CAROTID (at [facility] and [facility] only)  Result Date: 08/03/2019 Carotid Arterial Duplex Study Indications: CVA. Performing Technologist: Maudry Mayhew MHA, RDMS, RVT, RDCS  Examination Guidelines: A complete evaluation includes B-mode imaging, spectral Doppler, color Doppler, and power Doppler as needed of all accessible portions of each vessel. Bilateral testing is considered an integral part of a complete examination. Limited examinations for reoccurring indications may be performed as noted.  Right Carotid Findings:  +----------+--------+--------+--------+--------------------------+--------+           PSV cm/sEDV cm/sStenosisPlaque Description        Comments +----------+--------+--------+--------+--------------------------+--------+ CCA Prox  74      17                                                 +----------+--------+--------+--------+--------------------------+--------+ CCA Distal74      12              heterogenous and smooth            +----------+--------+--------+--------+--------------------------+--------+ ICA Prox  55      17              heterogenous and irregular         +----------+--------+--------+--------+--------------------------+--------+ ICA Distal84      26                                                 +----------+--------+--------+--------+--------------------------+--------+ ECA       111     11              smooth and heterogenous            +----------+--------+--------+--------+--------------------------+--------+ +----------+--------+-------+----------------+-------------------+           PSV cm/sEDV cmsDescribe        Arm Pressure (mmHG) +----------+--------+-------+----------------+-------------------+ [id number]            Multiphasic, WNL                    +----------+--------+-------+----------------+-------------------+ +---------+--------+--+--------+-+---------+ VertebralPSV cm/s27EDV cm/s7Antegrade +---------+--------+--+--------+-+---------+  Left Carotid Findings: +----------+--------+--------+--------+-----------------------+--------+           PSV cm/sEDV cm/sStenosisPlaque Description     Comments +----------+--------+--------+--------+-----------------------+--------+ CCA Prox  74  12              smooth and heterogenous         +----------+--------+--------+--------+-----------------------+--------+ CCA Distal81      18                                               +----------+--------+--------+--------+-----------------------+--------+ ICA Prox  91      15              smooth and heterogenous         +----------+--------+--------+--------+-----------------------+--------+ ICA Distal66      13                                              +----------+--------+--------+--------+-----------------------+--------+ ECA       98      10              smooth and heterogenous         +----------+--------+--------+--------+-----------------------+--------+ +----------+--------+--------+----------------+-------------------+           PSV cm/sEDV cm/sDescribe        Arm Pressure (mmHG) +----------+--------+--------+----------------+-------------------+ [id number]             Multiphasic, WNL                    +----------+--------+--------+----------------+-------------------+ +---------+--------+--+--------+--+---------+ VertebralPSV cm/s59EDV cm/s13Antegrade +---------+--------+--+--------+--+---------+   Summary: Right Carotid: Velocities in the right ICA are consistent with a 1-39% stenosis. Left Carotid: Velocities in the left ICA are consistent with a 1-39% stenosis. Vertebrals:  Bilateral vertebral arteries demonstrate antegrade flow. Subclavians: Normal flow hemodynamics were seen in bilateral subclavian              arteries. *See table(s) above for measurements and observations.  Electronically signed by Antony Contras MD on 08/03/2019 at 5:14:31 PM.    Final    ABORTED INVASIVE LAB PROCEDURE  Result Date: 08/25/2019 This case was aborted.  CT Maxillofacial Wo Contrast  Result Date: 08/02/2019 CLINICAL DATA:  Transient ischemic attack (TIA). Maxillofacial pain. Additional history provided: Patient presents for left-sided facial pressure and dizziness, pressure feeling radiates to left side of face and to ear. EXAM: CT HEAD WITHOUT CONTRAST CT MAXILLOFACIAL WITHOUT CONTRAST TECHNIQUE: Contiguous axial images were obtained from the  base of the skull through the vertex without intravenous contrast. Multidetector CT imaging of the maxillofacial structures was performed. Multiplanar CT image reconstructions were also generated. A small metallic BB was placed on the right temple in order to reliably differentiate right from left. COMPARISON:  No pertinent prior studies available for comparison. FINDINGS: CT HEAD FINDINGS Brain: Streak artifact limits evaluation of the inferomedial cerebellum. There is no evidence of acute intracranial hemorrhage. No demarcated cortical infarction. No evidence of intracranial mass. No midline shift or extra-axial fluid collection. There is a small region of ill-defined hypodensity the right frontal lobe white matter (series 4, images 20-24). Age-indeterminate lacunar infarct within the right lentiform nucleus/external capsule (series 4, images 16 and 17). Small chronic lacunar infarct within the right caudate. 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 Mild generalized parenchymal atrophy. Vascular: No hyperdense vessel.  Atherosclerotic calcifications Skull: Sequela of prior posterior fossa decompression, including a postsurgical defect within the C1 posterior arch. No calvarial fracture or  aggressive osseous lesion. CT MAXILLOFACIAL FINDINGS Osseous: No maxillofacial fracture. Small bony exostosis along the superficial aspect of the posterior right mandibular body (series 5, image 29). Orbits: No abnormality identified. Sinuses: No significant paranasal sinus disease. Soft tissues: Carotid artery calcified plaque. The visualized maxillofacial and upper neck soft tissues are otherwise unremarkable. Streak artifact from dental restoration significant limits evaluation of the oral cavity and also somewhat limits evaluation of the oropharynx. Within this limitation, there is no appreciable mass or swelling within the oral cavity, pharynx or larynx. Other: The temporomandibular joints are  unremarkable. Left mastoid effusion. IMPRESSION: CT head: 1. Age-indeterminate lacunar infarct within the right lentiform nucleus/external capsule. 2. Small focus of ill-defined hypoattenuation within the right frontal lobe subcortical white matter which may reflect chronic ischemic change. A recent white matter infarct at this site cannot be excluded. 3. Chronic lacunar infarct within the right caudate. 4. 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 5. Mild generalized parenchymal atrophy. CT maxillofacial: 1. Streak artifact from dental restoration limits evaluation of the oral cavity and oropharynx. Within this limitation, no swelling or discrete mass is appreciated within the oral cavity or pharynx. 2. Small left mastoid effusion. Electronically Signed   By: Kellie Simmering DO   On: 08/02/2019 18:50   Allergies and Final Medication List:   Allergies as of 08/26/2019      Reactions   Shellfish-derived Products Anaphylaxis   Ativan [lorazepam] Other (See Comments)   Makes "skin crawl" and insomnia   Buspar [buspirone] Nausea Only   Sweating and dizzy   Clarithromycin Swelling   Codeine Nausea And Vomiting   Doxycycline Other (See Comments)   Unknown   Guaifenesin Other (See Comments)   Palpitations   Oxycodone Nausea And Vomiting   Prednisone Nausea And Vomiting, Other (See Comments)   Makes my heart race   Statins Other (See Comments)   Muscle pain   Sulfonamide Derivatives Nausea And Vomiting   Achiness   Tetracycline Nausea Only   Vicodin [hydrocodone-acetaminophen] Nausea And Vomiting   Famotidine Rash   Latex Rash   Omeprazole Nausea Only   Cough, shortness of breath - patient doesn't remember   Peanut-containing Drug Products Itching, Rash   Wellbutrin [bupropion] Palpitations      Medication List    TAKE these medications   acetaminophen 500 MG tablet Commonly known as: TYLENOL Take 1,000 mg by mouth 2 (two) times daily as needed (pain).    acetaminophen 160 MG/5ML elixir Commonly known as: TYLENOL Take 500 mg by mouth every 4 (four) hours as needed for fever.   apixaban 5 MG Tabs tablet Commonly known as: ELIQUIS Take 1 tablet (5 mg total) by mouth 2 (two) times daily.   aspirin 81 MG chewable tablet Chew 1 tablet (81 mg total) by mouth daily.   carvedilol 6.25 MG tablet Commonly known as: COREG Take 1 tablet (6.25 mg total) by mouth 2 (two) times daily with a meal.   docusate sodium 100 MG capsule Commonly known as: COLACE Take 100 mg by mouth daily as needed for mild constipation.   ezetimibe 10 MG tablet Commonly known as: ZETIA Take 1 tablet (10 mg total) by mouth daily.   loratadine 10 MG tablet Commonly known as: CLARITIN Take 10 mg by mouth daily as needed for allergies.   nicotine 21 mg/24hr patch Commonly known as: NICODERM CQ - dosed in mg/24 hours Place 1 patch (21 mg total) onto the skin daily.   nicotine polacrilex 4 MG gum  Commonly known as: NICORETTE Take 1 each (4 mg total) by mouth as needed for smoking cessation.   pantoprazole 40 MG tablet Commonly known as: PROTONIX Take 1 tablet (40 mg total) by mouth daily at 12 noon.   Potassium Chloride ER 20 MEQ Tbcr Take 20 mEq by mouth daily.   rosuvastatin 10 MG tablet Commonly known as: CRESTOR Take 1 tablet (10 mg total) by mouth daily at 6 PM.   sacubitril-valsartan 49-51 MG Commonly known as: ENTRESTO Take 1 tablet by mouth 2 (two) times daily.   silver sulfADIAZINE 1 % cream Commonly known as: SILVADENE Apply to affected area daily on the back as needed.   spironolactone 25 MG tablet Commonly known as: ALDACTONE Take 0.5 tablets (12.5 mg total) by mouth daily.      Follow-up Information    [place] Office Follow up on 09/08/2019.   Specialty: Cardiology Why: at 8:30AM  Contact information: [address] [place]       Constance Haw, MD  Follow up on 11/29/2019.   Specialty: Cardiology Why: at 2:30PM  Contact information: [address] [place] [address] [phone]        Rex Kras, DO Follow up in 7 day(s).   Specialties: Cardiology, Radiology, Vascular Surgery Why: Check labs at [facility] on Monday, August 29, 2019. 7day TOC on 09/02/19 at 3:30pm  Contact information: [place] [address] [place] Follow up.   Specialty: Rehabilitation Why: Outpatient physical therapy once she is cleared by EP given the recent placement AICD placement. Please call them to schedule after your doctor clears you Contact information: [place] [id number] [place] [phone]         Total time spent on patient's discharge was 45 minutes.  Rex Kras, DO, [facility]. [place] Office: [phone]

## 2019-08-26 NOTE — Progress Notes (Addendum)
Electrophysiology Rounding Note  Patient Name: [name] Date of Encounter: 08/26/2019  Primary Cardiologist: Einar Gip Electrophysiologist: Curt Bears    Subjective   The patient is doing well today.  At this time, the patient denies chest pain, shortness of breath, or any new concerns.  Inpatient Medications    Scheduled Meds: . aspirin EC  81 mg Oral Daily  . carvedilol  6.25 mg Oral BID WC  . ezetimibe  10 mg Oral Daily  . nicotine  21 mg Transdermal Q24H  . pantoprazole  40 mg Oral Q1200  . rosuvastatin  10 mg Oral q1800  . sacubitril-valsartan  1 tablet Oral BID  . spironolactone  12.5 mg Oral Daily   Continuous Infusions: . sodium chloride 250 mL (08/26/19 0015)   PRN Meds: sodium chloride, acetaminophen, docusate sodium, loratadine, nicotine polacrilex, ondansetron (ZOFRAN) IV   Vital Signs    Vitals:   08/25/19 1951 08/26/19 0100 08/26/19 0447 08/26/19 0734  BP: 138/87 (!) 157/81 (!) 164/82 (!) 157/79  Pulse: 70 69 70 70  Resp: 16 20 18 15   Temp: 98.7 F (37.1 C) 98.5 F (36.9 C) 98.6 F (37 C) 97.8 F (36.6 C)  TempSrc: Oral Oral Oral Oral  SpO2: 92% 93% 95% 94%  Weight:   71.4 kg   Height:        Intake/Output Summary (Last 24 hours) at 08/26/2019 0802 Last data filed at 08/26/2019 0630 Gross per 24 hour  Intake 889.44 ml  Output 1350 ml  Net -460.56 ml   Filed Weights   08/24/19 0502 08/25/19 0424 08/26/19 0447  Weight: 72.3 kg 72.3 kg 71.4 kg    Physical Exam    GEN- The patient is well appearing, alert and oriented x 3 today.   Head- normocephalic, atraumatic Eyes-  Sclera clear, conjunctiva pink Ears- hearing intact Oropharynx- clear Neck- supple Lungs- Clear to ausculation bilaterally, normal work of breathing Heart- Regular rate and rhythm (paced) GI- soft, NT, ND, + BS Extremities- no clubbing, cyanosis, or edema Skin- no rash or lesion, left chest without hematoma/ecchymosis  Psych- euthymic mood, full affect Neuro- strength  and sensation are intact  Labs    CBC Recent Labs    08/24/19 0512 08/24/19 0535 08/25/19 0501 08/26/19 0417  WBC 5.6   < > 6.1 7.2  NEUTROABS 3.8  --   --   --   HGB 12.0   < > 12.7 13.1  HCT 36.6   < > 38.9 39.7  MCV 89.9   < > 89.2 87.8  PLT 219   < > 261 219   < > = values in this interval not displayed.   Basic Metabolic Panel Recent Labs    08/24/19 0942 08/24/19 0942 08/24/19 2035 08/25/19 0501  NA 140   < > 141 145  K 4.1   < > 3.3* 3.5  CL 106   < > 108 107  CO2 25   < > 25 26  GLUCOSE 131*   < > 157* 108*  BUN 8   < > 8 8  CREATININE 1.17*   < > 1.18* 1.10*  CALCIUM 8.3*   < > 8.5* 8.7*  MG 2.3  --  2.1  --    < > = values in this interval not displayed.  Thyroid Function Tests Recent Labs    08/24/19 0942  TSH 0.550    Telemetry    AV pacing (personally reviewed)  Radiology    EP PPM/ICD IMPLANT  Result Date: 08/25/2019 SURGEON: Allegra Lai, MD PREPROCEDURE DIAGNOSES: 1. Nonischemic cardiomyopathy 2. Polymorphic VT/VF 3. New York Heart Association class II, heart failure chronically. 4. Left bundle-branch block. POSTPROCEDURE DIAGNOSES: 1. Nonischemic cardiomyopathy. 2. Polymorphic VT/VF 3. New York Heart Association class II heart failure chronically. 4. Left bundle-branch block. PROCEDURES: 1. Left upper extremity venography 2. Biventricular ICD implantation. INTRODUCTION:  [name] is a 69 y.o. female with a nonischemic CM (EF 25-30%), NYHA Class III CHF, and LBBB QRS morophology. She is on optimal medical therapy. She presented to the hospital after receiving a shock by her life vest for polymorphic VT/VF. She has a LBBB and thus presents for CRT-D implant DESCRIPTION OF PROCEDURE: Informed written consent was obtained and the patient was brought to the electrophysiology lab in the fasting state. The patient was adequately sedated with intravenous Versed, and fentanyl as outlined in the nursing report. The patient's left chest was prepped and  draped in the usual sterile fashion by the EP lab staff. The skin overlying the left deltopectoral region was infiltrated with lidocaine for local analgesia. A 5-cm incision was made over the left deltopectoral region. A left subcutaneous defibrillator pocket was fashioned using a combination of sharp and blunt dissection. Electrocautery was used to assure hemostasis. RA/RV Lead Placement: The left axillary vein was cannulated with fluoroscopic visualization. No contrast was required for this endeavor. Through the left axillary vein, a Medtronic model P8572387 (serial # [id number] ) right atrial lead and a Medtronic model [id number] (serial number [id number]) right ventricular defibrillator lead were advanced with fluoroscopic visualization into the right atrial appendage and right ventricular apex positions respectively. Initial atrial lead P-waves measured 2.8  mV with an impedance of 617 ohms and a threshold of 1.4 volts at 0.5 milliseconds. The right ventricular lead R-wave measured 17 mV with impedance of 760 ohms and a threshold of 0.6 volts at 0.5 milliseconds. LV Lead Placement: A Medtronic MB-2 guide was advanced through the left axillary vein into the low lateral right atrium. A Bard curved Damato catheter was introduced through the MB-2 guide and used to cannulate the coronary sinus. Coronary sinus cannulation was confirmed with electrogram recording from the hexapolar catheter. A selective coronary sinus venogram was performed by hand injection of nonionic contrast. This demonstrated a large CS body with very small/ atretic distal branches. There was a moderate sized lateral coronary sinus branch was noted along the mid portion of the CS body. No other posterior branches were identified. A Whisper CSJ wire was introduced through the transseptal sheath and advanced into the distal posterolateral branch. A Medtronic model 4598 - 88 (serial number [id number] ) lead was advanced through the MB-2 into the lateral  branch. This was approximately one-thirds from the base to the apex in a very lateral position. In this location, the left ventricular lead R-waves measured 11.7 mV with impedance of 456 ohms and a threshold of 1 volt at 0.5 milliseconds in the bipolar LV3-LV4 configuration with no diaphragmatic stimulation observed when pacing at 10 volts output. The MB-2 guide was therefore removed. All three leads were secured to the pectoralis fascia using #2 silk suture over the suture sleeves. The pocket then irrigated with copious gentamicin solution. The leads were then connected to a [place] Quad CRT-D SureScan (serial Number [id number] ) device. The defibrillator was placed into the pocket. The pocket was then closed in 3 layers with 2.0 Vicryl suture for the subcutaneous and 3.0 Vicryl suture subcuticular layers. Steri-Strips and  a sterile dressing were then applied. DFT testing was not performed today. The procedure was therefore considered completed. EBL<52ml. There were no early apparhent complications. CONCLUSIONS: 1. Nonischemic cardiomyopathy with Left bundle-branch block and chronic New York Heart Association class III heart failure. 2. Successful biventricular ICD implantation. 3. No early apparent complications.   ABORTED INVASIVE LAB PROCEDURE  Result Date: 08/25/2019 This case was aborted.    Patient Profile     Janice Kealey Radfordis a 69 y.o.femalewith a history of CVA, VT/VF arrest, torsades, NICM, PVD, LBBB HLD, and HTNwho is being seen today for the evaluation of lifevest discharge/VFat the request of Dr. Einar Gip.  Assessment & Plan    1.  Recurrent VF S/p CRTD 08/25/19 CXR with leads in stable position. No obvious ptx Device interrogation reviewed and normal Keep K >3.5, Mg >1.8 (BMET pending this morning) No driving x6 months She [name] need EP follow up for secondary prevention (arranged)  2.  Chronic systolic heart failure Stable No change required today  4.   Hypokalemia BMET pending  5.  Paroxysmal atrial fibrillation Resume Eliquis 08/29/19 for CHADS2VASC of [name] sign off.   Medication Recommendations:  Stop amiodarone Other recommendations (labs, testing, etc):  BMET with close outpatient follow up of K+ Follow up as an outpatient:  As scheduled and entered in AVS   For questions or updates, please contact [facility] Please consult [email or web address] for contact info under Cardiology/STEMI.  Signed, Chanetta Marshall, NP  08/26/2019, 8:02 AM   I have seen and examined this patient with Chanetta Marshall.  Agree with above, note added to reflect my findings.  On exam, RRR, no murmurs, lungs clear.  She is now status post Medtronic CRT-D for NICM and VT/VF arrest.  Device functioning appropriately.  Chest x-ray and interrogation without issue.  [name] arrange for follow up in EP and device clinic.  [name] M. Janice Basulto MD 08/26/2019 8:38 AM

## 2019-08-26 NOTE — Evaluation (Signed)
Physical Therapy Evaluation Patient Details Name: [name] MRN: [id number] DOB: [birth year] Today's Date: 08/26/2019   History of Present Illness  69 y.o. female admitted for cardiac arrest, defibrillated by her life vest at home and now s/p ICD on 08/25/19.  Pt with significant PMH of essential HTN, PAF, h/o prior V-fib/tach arrest, CAD, CHF, Arnold-Chiari malformation (s/p surgery), R TKA, stroke L sided weakness.  Clinical Impression  Pt was able to walk down the hallway a short distance with min guard assist.  She has her husband's 24/7 assist at discharge.  We reviewed her ICD post op precautions.  She reports the MD does not want her to resume her OP PT at [facility] for 1 mo (after she has fully healed from surgery).  She would continue to benefit from therapy at that time.   PT to follow acutely for deficits listed below.      Follow Up Recommendations Outpatient PT;Other (comment)(up to MD discreation as she reports he wants to wait 1 mo)    Equipment Recommendations  None recommended by PT    Recommendations for Other Services   NA    Precautions / Restrictions Precautions Precautions: ICD/Pacemaker;Fall Precaution Comments: mildly unsteady on her feet reporting mild L sided residual weakness from recent stroke exacerbated by her recent hospitalizations x 2 Restrictions Weight Bearing Restrictions: No      Mobility  Bed Mobility Overal bed mobility: Modified Independent                Transfers Overall transfer level: Needs assistance Equipment used: 1 person hand held assist Transfers: Sit to/from Stand Sit to Stand: Min guard         General transfer comment: Min guard assist for safety, cues for assessing how she feels before she continues.   Ambulation/Gait Ambulation/Gait assistance: Min guard Gait Distance (Feet): 65 Feet Assistive device: 1 person hand held assist Gait Pattern/deviations: Step-through pattern;Staggering right;Staggering  left Gait velocity: decreased Gait velocity interpretation: 1.31 - 2.62 ft/sec, indicative of limited community ambulator General Gait Details: Pt with mildly staggering gait pattern, reports subjective weakness from stroke,   Stairs         General stair comments: NT as we did not walk far enough to make it to the stairs.  I discussed with her to use the right railing to avoid pulling with her left hand and have her husband on her right side for safety on the stairs.           Balance Overall balance assessment: Needs assistance Sitting-balance support: Feet supported;No upper extremity supported Sitting balance-Leahy Scale: Good     Standing balance support: Single extremity supported Standing balance-Leahy Scale: Fair                               Pertinent Vitals/Pain Pain Assessment: Faces Faces Pain Scale: Hurts little more Pain Location: sore L chest from ICD surgery Pain Descriptors / Indicators: Sore Pain Intervention(s): Limited activity within patient's tolerance;Monitored during session;Repositioned    Home Living Family/patient expects to be discharged to:: Private residence Living Arrangements: Spouse/significant other Available Help at Discharge: Family;Available 24 hours/day Type of Home: House Home Access: Stairs to enter Entrance Stairs-Rails: Psychiatric nurse of Steps: 3 Home Layout: One level Home Equipment: Walker - 2 wheels;Bedside commode;Shower seat Additional Comments: 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; states her 3in1 will not fit in the tub    Prior Function Level of  Independence: Needs assistance   Gait / Transfers Assistance Needed: has been ambulating with RW  ADL's / Homemaking Assistance Needed: husband has been doing laundry, she has been cooking  Comments: was active with OP PT, OT, SLP, however, pt reports MD wants her to wait a month before resuming.      Hand Dominance   Dominant Hand: Right     Extremity/Trunk Assessment   Upper Extremity Assessment Upper Extremity Assessment: LUE deficits/detail LUE Deficits / Details: left UE kept within pacemaker precautions, residual grip and UE weakness from recent stroke. Reviewed ICD precautions with pt and then again with husband when he arrived at the end of the session.     Lower Extremity Assessment Lower Extremity Assessment: Generalized weakness;LLE deficits/detail LLE Deficits / Details: left leg mildly weaker than R leg 4/5 grossly, no signs of buckling or foot drag during gait, fatigues quickly.        Communication   Communication: No difficulties  Cognition Arousal/Alertness: Awake/alert Behavior During Therapy: WFL for tasks assessed/performed Overall Cognitive Status: Within Functional Limits for tasks assessed                                               Assessment/Plan    PT Assessment Patient needs continued PT services  PT Problem List Decreased strength;Decreased activity tolerance;Decreased balance;Decreased mobility;Decreased knowledge of use of DME;Pain;Cardiopulmonary status limiting activity;Decreased knowledge of precautions       PT Treatment Interventions DME instruction;Stair training;Gait training;Functional mobility training;Therapeutic activities;Therapeutic exercise;Balance training;Patient/family education;Neuromuscular re-education    PT Goals (Current goals can be found in the Care Plan section)  Acute Rehab PT Goals Patient Stated Goal: to go home today if she can.  Return to normal life without fear, get back to organizing her closet.  PT Goal Formulation: With patient Time For Goal Achievement: 09/09/19 Potential to Achieve Goals: Good    Frequency Min 3X/week           AM-PAC PT "6 Clicks" Mobility  Outcome Measure Help needed turning from your back to your side while in a flat bed without using bedrails?: A Little Help needed moving from lying on your back to  sitting on the side of a flat bed without using bedrails?: A Little Help needed moving to and from a bed to a chair (including a wheelchair)?: A Little Help needed standing up from a chair using your arms (e.g., wheelchair or bedside chair)?: A Little Help needed to walk in hospital room?: A Little Help needed climbing 3-5 steps with a railing? : A Little 6 Click Score: 18    End of Session   Activity Tolerance: Patient limited by fatigue;Patient limited by pain Patient left: in bed;Other (comment);with family/visitor present(seated EOB with husband in room) Nurse Communication: Mobility status(husband now here) PT Visit Diagnosis: Muscle weakness (generalized) (M62.81);Unsteadiness on feet (R26.81);Pain Pain - Right/Left: Left Pain - part of body: (chest)    Time: 1010-1034 PT Time Calculation (min) (ACUTE ONLY): 24 min   Charges:          Verdene Lennert, PT, DPT  Acute Rehabilitation [phone] pager #[phone] office     PT Evaluation $PT Eval Moderate Complexity: 1 Mod PT Treatments $Gait Training: 8-22 mins       08/26/2019, 11:32 AM

## 2019-08-26 NOTE — Telephone Encounter (Signed)
Spoke to the patient's husband to update him regarding respiratory plan of care.  He can be reached at [phone].  He understands that he should pick up potassium supplements as prescribed prior to going home.  Patient will need repeat blood work on Monday August 29, 2019.  And I will see her in the office on September 02, 2019.  His questions and concerns were addressed to his satisfaction.  Rex Kras, DO, [facility]. [place] Office: [phone]

## 2019-08-26 NOTE — Discharge Instructions (Signed)
    Supplemental Discharge Instructions for  Pacemaker/Defibrillator Patients  Activity No heavy lifting or vigorous activity with your left/right arm for 6 to 8 weeks.  Do not raise your left/right arm above your head for one week.  Gradually raise your affected arm as drawn below.           __     08/30/19                       08/31/19                     09/01/19                  09/02/19  NO DRIVING for   6 months  WOUND CARE - Keep the wound area clean and dry.  Do not get this area wet for one week. No showers for one week; you may shower on   09/02/19  . - The tape/steri-strips on your wound will fall off; do not pull them off.  No bandage is needed on the site.  DO  NOT apply any creams, oils, or ointments to the wound area. - If you notice any drainage or discharge from the wound, any swelling or bruising at the site, or you develop a fever > 101? F after you are discharged home, call the office at once.  Special Instructions - You are still able to use cellular telephones; use the ear opposite the side where you have your pacemaker/defibrillator.  Avoid carrying your cellular phone near your device. - When traveling through airports, show security personnel your identification card to avoid being screened in the metal detectors.  Ask the security personnel to use the hand wand. - Avoid arc welding equipment, MRI testing (magnetic resonance imaging), TENS units (transcutaneous nerve stimulators).  Call the office for questions about other devices. - Avoid electrical appliances that are in poor condition or are not properly grounded. - Microwave ovens are safe to be near or to operate.  Additional information for defibrillator patients should your device go off: - If your device goes off ONCE and you feel fine afterward, notify the device clinic nurses. - If your device goes off ONCE and you do not feel well afterward, call 911. - If your device goes off TWICE, call 911. - If your  device goes off THREE times in one day, call 911.  DO NOT DRIVE YOURSELF OR A FAMILY MEMBER WITH A DEFIBRILLATOR TO THE HOSPITAL--CALL 911.

## 2019-08-29 ENCOUNTER — Encounter: Payer: Self-pay | Admitting: Occupational Therapy

## 2019-08-29 DIAGNOSIS — E876 Hypokalemia: Secondary | ICD-10-CM | POA: Diagnosis not present

## 2019-08-29 DIAGNOSIS — I429 Cardiomyopathy, unspecified: Secondary | ICD-10-CM | POA: Diagnosis not present

## 2019-08-29 DIAGNOSIS — I480 Paroxysmal atrial fibrillation: Secondary | ICD-10-CM | POA: Diagnosis not present

## 2019-08-29 NOTE — Progress Notes (Signed)
Order(s) created erroneously. Erroneous order ID: [id number]  Order moved by: Pete Pelt  Order move date/time: 08/29/2019 10:11 AM  Source Patient: [id number]  Source Contact: 08/23/2019  Destination Patient: [id number]  Destination Contact: 08/23/2019  Erroneous order ID: [id number]  Order moved by: Pete Pelt  Order move date/time: 08/29/2019 10:11 AM  Source Patient: [id number]  Source Contact: 08/23/2019  Destination Patient: [id number]  Destination Contact: 08/23/2019  Erroneous order ID: [id number]  Order moved by: Pete Pelt  Order move date/time: 08/29/2019 10:11 AM  Source Patient: [id number]  Source Contact: 08/23/2019  Destination Patient: [id number]  Destination Contact: 08/23/2019

## 2019-08-29 NOTE — Progress Notes (Signed)
Order(s) created erroneously. Erroneous order ID: [id number]  Order moved by: Pete Pelt  Order move date/time: 08/29/2019 10:11 AM  Source Patient: [id number]  Source Contact: 08/23/2019  Destination Patient: [id number]  Destination Contact: 08/23/2019  Erroneous order ID: [id number]  Order moved by: Pete Pelt  Order move date/time: 08/29/2019 10:11 AM  Source Patient: [id number]  Source Contact: 08/23/2019  Destination Patient: [id number]  Destination Contact: 08/23/2019  Erroneous order ID: [id number]  Order moved by: Pete Pelt  Order move date/time: 08/29/2019 10:11 AM  Source Patient: [id number]  Source Contact: 08/23/2019  Destination Patient: [id number]  Destination Contact: 08/23/2019

## 2019-08-29 NOTE — Therapy (Signed)
[facility] [address] [place], [place], [address] Phone: [phone]   Fax:  [phone]  August 29, 2019    No Recipients  Occupational Therapy Discharge Summary   Patient: [name] MRN: [id number] Date of Birth: 1951-01-07  Diagnosis: No diagnosis found.  Referring Provider (OT): Will send to PCP Alma Friendly     The treatment consisted of evaluation only The patient is: patient has called to cancel scheduled visits due to recent hospitalization with cardiac issues.  Subjective: Patient would likely be an appropriate candidate for OT services once medically stable to participate.    Discharge Findings: NA - Only Evaluation completed  Functional Status at Discharge:Patient not seen since evaluation      Sincerely,  Buford Bremer, [place] Hux[place], [place] [address] [place], [place], [address] Phone: [phone]   Fax:  [phone]  Patient: [name] MRN: [id number] Date of Birth: Aug 23, 1950

## 2019-08-30 ENCOUNTER — Encounter: Payer: Self-pay | Admitting: Physical Therapy

## 2019-08-30 LAB — BASIC METABOLIC PANEL
BUN/Creatinine Ratio: 11 — ABNORMAL LOW (ref 12–28)
BUN: 15 mg/dL (ref 8–27)
CO2: 25 mmol/L (ref 20–29)
Calcium: 9 mg/dL (ref 8.7–10.3)
Chloride: 106 mmol/L (ref 96–106)
Creatinine, Ser: 1.38 mg/dL — ABNORMAL HIGH (ref 0.57–1.00)
GFR calc Af Amer: 45 mL/min/{1.73_m2} — ABNORMAL LOW (ref >59)
GFR calc non Af Amer: 39 mL/min/{1.73_m2} — ABNORMAL LOW (ref >59)
Glucose: 90 mg/dL (ref 65–99)
Potassium: 4.1 mmol/L (ref 3.5–5.2)
Sodium: 146 mmol/L — ABNORMAL HIGH (ref 134–144)

## 2019-08-30 LAB — MAGNESIUM: Magnesium: 2 mg/dL (ref 1.6–2.3)

## 2019-08-30 LAB — PRO B NATRIURETIC PEPTIDE: NT-Pro BNP: 2350 pg/mL — ABNORMAL HIGH (ref 0–301)

## 2019-08-30 NOTE — Therapy (Signed)
[facility] [address] [place], [place], [address] Phone: [phone]   Fax:  [phone]  Patient Details  Name: [name] MRN: [id number] Date of Birth: [birth year] Referring Provider:  No ref. provider found  Encounter Date: 08/30/2019  PHYSICAL THERAPY DISCHARGE SUMMARY  Visits from Start of Care: 1 - evaluation only.     Patient has called to cancel scheduled visits due to recent hospitalization with cardiac issues. Patient would benefit from skilled PT services once medically appropriate.    Plan: Patient agrees to discharge.  Patient goals were not met. Patient is being discharged due to a change in medical status.  ?????       Arliss Journey, PT, DPT  08/30/2019, 11:47 AM  [facility] [address] [place], [place], [address] Phone: [phone]   Fax:  [phone]

## 2019-09-01 ENCOUNTER — Ambulatory Visit (INDEPENDENT_AMBULATORY_CARE_PROVIDER_SITE_OTHER): Payer: Medicare HMO | Admitting: Primary Care

## 2019-09-01 ENCOUNTER — Other Ambulatory Visit: Payer: Self-pay

## 2019-09-01 DIAGNOSIS — Z8673 Personal history of transient ischemic attack (TIA), and cerebral infarction without residual deficits: Secondary | ICD-10-CM | POA: Diagnosis not present

## 2019-09-01 DIAGNOSIS — L299 Pruritus, unspecified: Secondary | ICD-10-CM | POA: Diagnosis not present

## 2019-09-01 NOTE — Assessment & Plan Note (Signed)
Improved today after holding last night's pantoprazole. Discussed to hold tonight's dose to see if she notices any further improvement.   Discussed use of Claritin, hydrocortisone cream.  No rash or evidence of shingles. She will update.

## 2019-09-01 NOTE — Patient Instructions (Signed)
Try loratadine (Claritin) daily for the itching.  Continue to hold the pantoprazole (Protonix) medication for heartburn.  Follow up with cardiology as scheduled.   It was a pleasure to see you today!

## 2019-09-01 NOTE — Progress Notes (Signed)
Subjective:    Patient ID: [name], female    DOB: [birth year], 69 y.o.   MRN: [id number]  HPI  This visit occurred during the SARS-CoV-2 public health emergency.  Safety protocols were in place, including screening questions prior to the visit, additional usage of staff PPE, and extensive cleaning of exam room while observing appropriate contact time as indicated for disinfecting solutions.   [name] is a 69 year old female with an extensive medical history including TIA, cardiomyopathy, hypertension, cardiac arrest, ICD in place, CKD, anxiety, tobacco abuse, hyperlipidemia who presents today with a chief complaint of itching. She is also needing Rx's for lift and shower chairs.  Her itching is located to the thoracic back which began about 4-6 weeks ago, she initially thought that the "life vest" caused the itching but was told by the live vest company that it shouldn't cause itching. She then began thinking and has come to the conclusion that her pantoprazole medication could be the cause. She has a history of rash with famotidine. She held her dose of pantoprazole last night and has noticed a reduction of itching today. She's been using hydrocortisone cream with some improvement.   She is also requesting a lift recliner due to weakness secondary to her stroke and recent cardiac arrest. She recently had an ICD implanted and cannot use her left side. Her significant other is helping to lift her now but he is also elderly and cannot do much more.   She is also needing a shower chair to help with bathing. She cannot stand for longer than a few minutes without feeling nervous about falling.     Review of Systems  Musculoskeletal:       Altered gait since stroke  Skin: Negative for rash.       Itching   Neurological: Positive for weakness. Negative for dizziness.       Past Medical History:  Diagnosis Date  . Allergy   . Anxiety   . Arnold-Chiari malformation ([place])   .  Arthritis   . CHF (congestive heart failure) ([place])   . Coronary artery disease   . GERD (gastroesophageal reflux disease)   . H. pylori infection    3 years ago  . H/O cardiac arrest    Hx of VT/Vfib arrest  . Hyperlipidemia   . Incontinence   . Paroxysmal atrial fibrillation ([place]) 08/06/2019  . Syncope and collapse    Per pt, denies passing out  . Tobacco use disorder   . Unspecified essential hypertension      Social History   Socioeconomic History  . Marital status: Married    Spouse name: Not on file  . Number of children: 2  . Years of education: Not on file  . Highest education level: Not on file  Occupational History  . Occupation: hair dresser  Tobacco Use  . Smoking status: Former Smoker    Packs/day: 1.00    Years: 50.00    Pack years: 50.00    Types: Cigarettes    Quit date: 08/08/2019    Years since quitting: 0.0  . Smokeless tobacco: Never Used  Substance and Sexual Activity  . Alcohol use: No    Alcohol/week: 0.0 standard drinks  . Drug use: No  . Sexual activity: Not on file  Other Topics Concern  . Not on file  Social History Narrative  . Not on file   Social Determinants of Health   Financial Resource Strain:   .  Difficulty of Paying Living Expenses:   Food Insecurity:   . Worried About Charity fundraiser in the Last Year:   . Arboriculturist in the Last Year:   Transportation Needs:   . Film/video editor (Medical):   [name] Lack of Transportation (Non-Medical):   Physical Activity:   . Days of Exercise per Week:   . Minutes of Exercise per Session:   Stress:   . Feeling of Stress :   Social Connections:   . Frequency of Communication with Friends and Family:   . Frequency of Social Gatherings with Friends and Family:   . Attends Religious Services:   . Active Member of Clubs or Organizations:   . Attends Archivist Meetings:   [name] Marital Status:   Intimate Partner Violence:   . Fear of Current or Ex-Partner:   .  Emotionally Abused:   [name] Physically Abused:   . Sexually Abused:     Past Surgical History:  Procedure Laterality Date  . APPENDECTOMY    . ARNOLD CHIARI SURGERY     neurocranial surgery  . BIV ICD INSERTION CRT-D N/A 08/25/2019   Procedure: BIV ICD INSERTION CRT-D;  Surgeon: Constance Haw, MD;  Location: [facility];  Service: Cardiovascular;  Laterality: N/A;  . BLEPHAROPLASTY     Bil  . C-EYE SURGERY PROCEDURE    . CARDIAC CATHETERIZATION    . CHOLECYSTECTOMY    . EYE SURGERY    . heart failure    . LEFT HEART CATH AND CORONARY ANGIOGRAPHY N/A 08/04/2019   Procedure: LEFT HEART CATH AND CORONARY ANGIOGRAPHY;  Surgeon: Adrian Prows, MD;  Location: [facility];  Service: Cardiovascular;  Laterality: N/A;  . MASS EXCISION Right 12/27/2013   Procedure: MINOR EXCISION OF RIGHT THUMB MUCOID CYST, DEBRIDEMENT OF INTERPHALANGEAL JOINT;  Surgeon: Cammie Sickle, MD;  Location: [facility];  Service: Orthopedics;  Laterality: Right;  . mass on thumb  right  . TOTAL KNEE ARTHROPLASTY Right 02/17/2017  . TOTAL KNEE ARTHROPLASTY Right 02/17/2017   Procedure: TOTAL KNEE ARTHROPLASTY;  Surgeon: Melrose Nakayama, MD;  Location: [facility];  Service: Orthopedics;  Laterality: Right;  . TUBAL LIGATION      Family History  Problem Relation Age of Onset  . Bone cancer Mother   . Heart disease Mother   . Cancer Sister        metastatic; unknown primary  . Diverticulosis Sister   . Tuberculosis Father   . Diabetes Sister   . Hypertension Sister   . Colon cancer Neg Hx   . Esophageal cancer Neg Hx   . Pancreatic cancer Neg Hx   . Stomach cancer Neg Hx   . Liver disease Neg Hx   . Kidney disease Neg Hx   . Rectal cancer Neg Hx     Allergies  Allergen Reactions  . Shellfish-Derived Products Anaphylaxis  . Ativan [Lorazepam] Other (See Comments)    Makes "skin crawl" and insomnia  . Buspar [Buspirone] Nausea Only    Sweating and dizzy  . Clarithromycin  Swelling  . Codeine Nausea And Vomiting  . Doxycycline Other (See Comments)    Unknown  . Guaifenesin Other (See Comments)    Palpitations  . Oxycodone Nausea And Vomiting  . Prednisone Nausea And Vomiting and Other (See Comments)    Makes my heart race  . Statins Other (See Comments)    Muscle pain  . Sulfonamide Derivatives Nausea And  Vomiting    Achiness  . Tetracycline Nausea Only  . Vicodin [Hydrocodone-Acetaminophen] Nausea And Vomiting  . Famotidine Rash  . Latex Rash  . Omeprazole Nausea Only    Cough, shortness of breath - patient doesn't remember  . Peanut-Containing Drug Products Itching and Rash  . Wellbutrin [Bupropion] Palpitations    Current Outpatient Medications on File Prior to Visit  Medication Sig Dispense Refill  . acetaminophen (TYLENOL) 160 MG/5ML elixir Take 500 mg by mouth every 4 (four) hours as needed for fever.    [name] acetaminophen (TYLENOL) 500 MG tablet Take 1,000 mg by mouth 2 (two) times daily as needed (pain).    [name] apixaban (ELIQUIS) 5 MG TABS tablet Take 1 tablet (5 mg total) by mouth 2 (two) times daily. 60 tablet 0  . aspirin 81 MG chewable tablet Chew 1 tablet (81 mg total) by mouth daily. 30 tablet 0  . carvedilol (COREG) 6.25 MG tablet Take 1 tablet (6.25 mg total) by mouth 2 (two) times daily with a meal. 60 tablet 0  . docusate sodium (COLACE) 100 MG capsule Take 100 mg by mouth daily as needed for mild constipation.    [name] ezetimibe (ZETIA) 10 MG tablet Take 1 tablet (10 mg total) by mouth daily. 30 tablet 0  . loratadine (CLARITIN) 10 MG tablet Take 10 mg by mouth daily as needed for allergies.     . nicotine (NICODERM CQ - DOSED IN MG/24 HOURS) 21 mg/24hr patch Place 1 patch (21 mg total) onto the skin daily. 30 patch 0  . nicotine polacrilex (NICORETTE) 4 MG gum Take 1 each (4 mg total) by mouth as needed for smoking cessation. 50 tablet 0  . pantoprazole (PROTONIX) 40 MG tablet Take 1 tablet (40 mg total) by mouth daily at 12 noon. 30  tablet 0  . Potassium Chloride ER 20 MEQ TBCR Take 20 mEq by mouth daily. 30 tablet 0  . rosuvastatin (CRESTOR) 10 MG tablet Take 1 tablet (10 mg total) by mouth daily at 6 PM. 30 tablet 0  . sacubitril-valsartan (ENTRESTO) 49-51 MG Take 1 tablet by mouth 2 (two) times daily. 60 tablet 0  . silver sulfADIAZINE (SILVADENE) 1 % cream Apply to affected area daily on the back as needed. 25 g 0  . spironolactone (ALDACTONE) 25 MG tablet Take 0.5 tablets (12.5 mg total) by mouth daily. 15 tablet 0   No current facility-administered medications on file prior to visit.    BP 126/84   Pulse 74   Temp (!) 97.1 F (36.2 C) (Temporal)   Ht 5\' 4"  (1.626 m)   Wt 155 lb 8 oz (70.5 kg)   SpO2 97%   BMI 26.69 kg/m    Objective:   Physical Exam  Constitutional: She is oriented to person, place, and time. She appears well-nourished.  Cardiovascular: Normal rate and regular rhythm.  Respiratory: Effort normal and breath sounds normal.  Neurological: She is alert and oriented to person, place, and time.  Skin: Skin is warm and dry. No rash noted. No erythema.  Small healing abrasions located to posterior trunk from scratching. No rashes noted on body.  Psychiatric: She has a normal mood and affect.           Assessment & Plan:

## 2019-09-01 NOTE — Assessment & Plan Note (Signed)
Prescriptions provided for power lift chair and shower chair. She will take these to the medical supply store.

## 2019-09-02 ENCOUNTER — Ambulatory Visit: Payer: Medicare HMO | Admitting: Cardiology

## 2019-09-02 ENCOUNTER — Encounter: Payer: Self-pay | Admitting: Cardiology

## 2019-09-02 VITALS — BP 135/63 | HR 70 | Temp 97.2°F | Ht 64.0 in | Wt 151.3 lb

## 2019-09-02 DIAGNOSIS — I10 Essential (primary) hypertension: Secondary | ICD-10-CM

## 2019-09-02 DIAGNOSIS — Z87891 Personal history of nicotine dependence: Secondary | ICD-10-CM | POA: Diagnosis not present

## 2019-09-02 DIAGNOSIS — R69 Illness, unspecified: Secondary | ICD-10-CM | POA: Diagnosis not present

## 2019-09-02 DIAGNOSIS — E876 Hypokalemia: Secondary | ICD-10-CM

## 2019-09-02 DIAGNOSIS — I480 Paroxysmal atrial fibrillation: Secondary | ICD-10-CM

## 2019-09-02 DIAGNOSIS — I447 Left bundle-branch block, unspecified: Secondary | ICD-10-CM

## 2019-09-02 DIAGNOSIS — Z8673 Personal history of transient ischemic attack (TIA), and cerebral infarction without residual deficits: Secondary | ICD-10-CM | POA: Diagnosis not present

## 2019-09-02 DIAGNOSIS — Z95810 Presence of automatic (implantable) cardiac defibrillator: Secondary | ICD-10-CM

## 2019-09-02 DIAGNOSIS — Z7901 Long term (current) use of anticoagulants: Secondary | ICD-10-CM | POA: Diagnosis not present

## 2019-09-02 DIAGNOSIS — E782 Mixed hyperlipidemia: Secondary | ICD-10-CM | POA: Diagnosis not present

## 2019-09-02 DIAGNOSIS — I429 Cardiomyopathy, unspecified: Secondary | ICD-10-CM | POA: Diagnosis not present

## 2019-09-02 DIAGNOSIS — I462 Cardiac arrest due to underlying cardiac condition: Secondary | ICD-10-CM | POA: Diagnosis not present

## 2019-09-02 DIAGNOSIS — Z8674 Personal history of sudden cardiac arrest: Secondary | ICD-10-CM

## 2019-09-02 DIAGNOSIS — I5023 Acute on chronic systolic (congestive) heart failure: Secondary | ICD-10-CM | POA: Diagnosis not present

## 2019-09-02 DIAGNOSIS — I639 Cerebral infarction, unspecified: Secondary | ICD-10-CM | POA: Diagnosis not present

## 2019-09-02 DIAGNOSIS — R531 Weakness: Secondary | ICD-10-CM | POA: Diagnosis not present

## 2019-09-02 MED ORDER — CARVEDILOL 6.25 MG PO TABS: 6.2500 mg | ORAL_TABLET | Freq: Two times a day (BID) | ORAL | 0 refills | Status: DC

## 2019-09-02 MED ORDER — SACUBITRIL-VALSARTAN 49-51 MG PO TABS: 1.0000 | ORAL_TABLET | Freq: Two times a day (BID) | ORAL | 0 refills | Status: DC

## 2019-09-02 MED ORDER — APIXABAN 5 MG PO TABS: 5.0000 mg | ORAL_TABLET | Freq: Two times a day (BID) | ORAL | 0 refills | Status: DC

## 2019-09-02 MED ORDER — SPIRONOLACTONE 25 MG PO TABS: 12.5000 mg | ORAL_TABLET | Freq: Every day | ORAL | 0 refills | Status: DC

## 2019-09-02 MED ORDER — DOCUSATE SODIUM 100 MG PO CAPS: 100.0000 mg | ORAL_CAPSULE | Freq: Every day | ORAL | 0 refills | Status: DC | PRN

## 2019-09-02 MED ORDER — SPIRONOLACTONE 25 MG PO TABS: 12.5000 mg | ORAL_TABLET | Freq: Every day | ORAL | 1 refills | Status: DC

## 2019-09-02 NOTE — Patient Instructions (Addendum)
Please remember to bring in your medication bottles in at the next visit.   Please get labs done on Monday 09/05/2019 at the nearest [facility].  Recommend follow up with your PCP as scheduled.

## 2019-09-02 NOTE — Progress Notes (Signed)
Do   Chief Complaint  Patient presents with  . Congestive Heart Failure  . Hospitalization Follow-up    REQUESTING PHYSICIAN:  Pleas Koch, NP [place] [place],  [place] [id number]  HPI  [name] is a 69 y.o. female who presents to the office with a chief complaint of " hospital follow-up and heart failure." Patient's past medical history and cardiac risk factors include: Hypertension, hyperlipidemia, tobacco use, history of Arnold-Chiari malformation, history of VT/VF, cardiac arrest, nonischemic cardiomyopathy, peripheral vascular disease, paroxysmal atrial fibrillation, left bundle branch block, history of CVA.  Patient is accompanied by her husband at today's office visit.  Patient was recently hospitalized for cardiac arrest secondary to ventricular fibrillation with successful defibrillation by her LifeVest.    Post hospital follow-up: LifeVest was interrogated and her underlying rhythm was ventricular fibrillation.  Patient was stabilized in the ER and transferred to medical floors and was seen by electrophysiology given her recent cardiac arrest secondary to ventricular fibrillation.  Patient remained stable on telemetry and underwent placement of a BiV ICD implantation.  Patient was discharged home on guideline directed medical therapy.  She had blood work on Monday, August 29, 2019 which was reviewed with the patient at today's office visit.  Patient's potassium levels remained stable.  She does have a degree of acute kidney injury.  She has an upcoming appointment with EP on Thursday at 8:30 AM given her recent implantation of biventricular ICD.  From heart failure standpoint patient denies any shortness of breath at rest or with effort related activities, no orthopnea, no paroxysmal nocturnal dyspnea or lower extremity swelling.  Her weight remained stable.  She is compliant with her medical therapy.    Review of systems positive for: Shortness of breath (improving).  Currently patient denies chest pain, lightheadedness, dizziness, palpitations,  paroxysmal nocturnal dyspnea, lower extremity swelling, near syncope, syncopal events, hematochezia, hemoptysis, hematemesis, melanotic stools.  ALLERGIES: Allergies  Allergen Reactions  . Shellfish-Derived Products Anaphylaxis  . Ativan [Lorazepam] Other (See Comments)    Makes "skin crawl" and insomnia  . Buspar [Buspirone] Nausea Only    Sweating and dizzy  . Clarithromycin Swelling  . Codeine Nausea And Vomiting  . Doxycycline Other (See Comments)    Unknown  . Guaifenesin Other (See Comments)    Palpitations  . Oxycodone Nausea And Vomiting  . Prednisone Nausea And Vomiting and Other (See Comments)    Makes my heart race  . Statins Other (See Comments)    Muscle pain  . Sulfonamide Derivatives Nausea And Vomiting    Achiness  . Tetracycline Nausea Only  . Vicodin [Hydrocodone-Acetaminophen] Nausea And Vomiting  . Famotidine Rash  . Latex Rash  . Omeprazole Nausea Only    Cough, shortness of breath - patient doesn't remember  . Peanut-Containing Drug Products Itching and Rash  . Wellbutrin [Bupropion] Palpitations     MEDICATION LIST PRIOR TO VISIT: Current Outpatient Medications on File Prior to Visit  Medication Sig Dispense Refill  . acetaminophen (TYLENOL) 500 MG tablet Take 1,000 mg by mouth 2 (two) times daily as needed (pain).    [name] aspirin 81 MG chewable tablet Chew 1 tablet (81 mg total) by mouth daily. 30 tablet 0  . ezetimibe (ZETIA) 10 MG tablet Take 1 tablet (10 mg total) by mouth daily. 30 tablet 0  . nicotine (NICODERM CQ - DOSED IN MG/24 HOURS) 21 mg/24hr patch Place 1 patch (21 mg total) onto the skin daily. 30 patch 0  .  Potassium Chloride ER 20 MEQ TBCR Take 20 mEq by mouth daily. 30 tablet 0  . rosuvastatin (CRESTOR) 10 MG tablet Take 1 tablet (10 mg total) by mouth daily at 6 PM. 30 tablet 0   No current facility-administered medications on file prior to visit.     PAST MEDICAL HISTORY: Past Medical History:  Diagnosis Date  . Allergy   . Anxiety   . Arnold-Chiari malformation ([facility])   . Arthritis   . CHF (congestive heart failure) ([facility])   . Coronary artery disease   . GERD (gastroesophageal reflux disease)   . H. pylori infection    3 years ago  . H/O cardiac arrest    Hx of VT/Vfib arrest  . Hyperlipidemia   . Incontinence   . Paroxysmal atrial fibrillation ([facility]) 08/06/2019  . Syncope and collapse    Per pt, denies passing out  . Tobacco use disorder   . Unspecified essential hypertension     PAST SURGICAL HISTORY: Past Surgical History:  Procedure Laterality Date  . APPENDECTOMY    . ARNOLD CHIARI SURGERY     neurocranial surgery  . BIV ICD INSERTION CRT-D N/A 08/25/2019   Procedure: BIV ICD INSERTION CRT-D;  Surgeon: Constance Haw, MD;  Location: [facility];  Service: Cardiovascular;  Laterality: N/A;  . BLEPHAROPLASTY     Bil  . C-EYE SURGERY PROCEDURE    . CARDIAC CATHETERIZATION    . CHOLECYSTECTOMY    . EYE SURGERY    . heart failure    . LEFT HEART CATH AND CORONARY ANGIOGRAPHY N/A 08/04/2019   Procedure: LEFT HEART CATH AND CORONARY ANGIOGRAPHY;  Surgeon: Adrian Prows, MD;  Location: [facility];  Service: Cardiovascular;  Laterality: N/A;  . MASS EXCISION Right 12/27/2013   Procedure: MINOR EXCISION OF RIGHT THUMB MUCOID CYST, DEBRIDEMENT OF INTERPHALANGEAL JOINT;  Surgeon: Cammie Sickle, MD;  Location: [facility];  Service: Orthopedics;  Laterality: Right;  . mass on thumb  right  . TOTAL KNEE ARTHROPLASTY Right 02/17/2017  . TOTAL KNEE ARTHROPLASTY Right 02/17/2017   Procedure: TOTAL KNEE ARTHROPLASTY;  Surgeon: Melrose Nakayama, MD;  Location: [facility];  Service: Orthopedics;  Laterality: Right;  . TUBAL LIGATION      FAMILY HISTORY: The patient family history includes Bone cancer in her mother; Cancer in her sister; Diabetes in her sister; Diverticulosis in her sister; Heart  disease in her mother; Hypertension in her sister; Tuberculosis in her father.   SOCIAL HISTORY:  The patient  reports that she quit smoking about 3 weeks ago. Her smoking use included cigarettes. She has a 50.00 pack-year smoking history. She has never used smokeless tobacco. She reports that she does not drink alcohol or use drugs.  14 ORGAN REVIEW OF SYSTEMS: CONSTITUTIONAL: No fever or significant weight loss EYES: No recent significant visual change EARS, NOSE, MOUTH, THROAT: No recent significant change in hearing CARDIOVASCULAR: See discussion in subjective/HPI RESPIRATORY: See discussion in subjective/HPI GASTROINTESTINAL: No recent complaints of abdominal pain GENITOURINARY: No recent significant change in genitourinary status MUSCULOSKELETAL: No recent significant change in musculoskeletal status INTEGUMENTARY: No recent rash NEUROLOGIC: No recent significant change in motor function PSYCHIATRIC: No recent significant change in mood ENDOCRINOLOGIC: No recent significant change in endocrine status HEMATOLOGIC/LYMPHATIC: No recent significant unexpected bruising ALLERGIC/IMMUNOLOGIC: No recent unexplained allergic reaction  PHYSICAL EXAM: Vitals with BMI 09/02/2019 09/01/2019 08/26/2019  Height 5\' 4"  5\' 4"  -  Weight 151 lbs 5 oz 155 lbs 8 oz -  BMI 39.76 73.41 -  Systolic 937 902 409  Diastolic 63 84 60  Pulse 70 74 69   CONSTITUTIONAL: Well-developed and well-nourished. No acute distress.  SKIN: Skin is warm and dry. No rash noted. No cyanosis. No pallor. No jaundice HEAD: Normocephalic and atraumatic.  EYES: No scleral icterus MOUTH/THROAT: Moist oral membranes.  NECK: No JVD present. No thyromegaly noted.   LYMPHATIC: No visible cervical adenopathy.  CHEST Normal respiratory effort. No intercostal retractions.  BiV ICD noted left infraclavicular region. LUNGS: Clear to auscultation bilaterally.  No stridor. No wheezes. No rales.  CARDIOVASCULAR: Regular, positive S1-S2,  soft holosystolic murmur heard at the apex radiating to axilla, no gallops or rubs appreciated ABDOMINAL: Nonobese, soft, nontender, nondistended, positive bowel sounds in all 4 quadrants no apparent ascites.  EXTREMITIES: No peripheral edema. 2+ right femoral pulse, 1+ left femoral pulse, none palpable bilateral popliteal, dorsalis pedis or posterior tibial pulses.  Warm to touch bilaterally.  No discoloration or cyanosis present. HEMATOLOGIC: No significant bruising NEUROLOGIC: Oriented to person, place, and time. Nonfocal. Normal muscle tone.  PSYCHIATRIC: Normal mood and affect. Normal behavior. Cooperative  CARDIAC DATABASE: EKG: 08/12/2019: Normal sinus rhythm with ventricular rate of 61 bpm, left axis deviation, left bundle branch block, nonspecific T wave abnormalities.  Prior EKG dated 08/04/2019 shows a normal sinus rhythm, left axis deviation, left bundle branch block  Echocardiogram: 08/03/2019: LVEF 25-30%, severely reduced left ventricular systolic function, global hypokinesis, moderate concentric left ventricular hypertrophy, mild MR, 08/04/2019: LVEF 25-30%, severely reduced LV function, global hypokinesis, paradoxical septal motion secondary to LBBB.   Heart Catheterization: 08/04/19:  LV: Global hypokinesis, upper limit of normal size, EF 25 to 30%. Left main: Normal. LAD: Mild diffuse disease.  Proximal LAD has a 30 to 40% stenosis, mid segment has a 20 to 30% stenosis, scattered disease noted in the LAD.  Brisk flow. Circumflex: Again scattered disease noted in the circumflex.  Mid segment has at most a 30 to 40% stenosis which appears to be eccentric and calcified. RCA: Dominant.  Mild diffuse disease again noted.  Mid segment after the origin of RV branch has a 70 to 80% stenosis.  Brisk flow is evident throughout the RCA. Impression: Findings consistent with nonischemic cardiomyopathy.  Although she has significant disease in the right coronary artery, this does not explain  her presentation with global hypokinesis, neither does it explain VF arrest as the lesion does not appear to be unstable.   Carotid duplex: 08/03/2019: Right Carotid: Velocities in the right ICA are consistent with a 1-39% stenosis. Left Carotid: Velocities in the left ICA are consistent with a 1-39%  stenosis. Vertebrals: Bilateral vertebral arteries demonstrate antegrade flow. Subclavians: Normal flow hemodynamics were seen in bilateral subclavian arteries.  LABORATORY DATA: CBC Latest Ref Rng & Units 08/26/2019 08/25/2019 08/24/2019  WBC 4.0 - 10.5 K/uL 7.2 6.1 -  Hemoglobin 12.0 - 15.0 g/dL 13.1 12.7 10.5(L)  Hematocrit 36.0 - 46.0 % 39.7 38.9 31.0(L)  Platelets 150 - 400 K/uL 219 261 -    CMP Latest Ref Rng & Units 08/29/2019 08/26/2019 08/25/2019  Glucose 65 - 99 mg/dL 90 138(H) 108(H)  BUN 8 - 27 mg/dL 15 9 8   Creatinine 0.57 - 1.00 mg/dL 1.38(H) 1.14(H) 1.10(H)  Sodium 134 - 144 mmol/L 146(H) 139 145  Potassium 3.5 - 5.2 mmol/L 4.1 4.0 3.5  Chloride 96 - 106 mmol/L 106 105 107  CO2 20 - 29 mmol/L 25 22 26   Calcium 8.7 - 10.3 mg/dL 9.0 9.0  8.7(L)  Total Protein 6.5 - 8.1 g/dL - - -  Total Bilirubin 0.3 - 1.2 mg/dL - - -  Alkaline Phos 38 - 126 U/L - - -  AST 15 - 41 U/L - - -  ALT 0 - 44 U/L - - -    Lipid Panel     Component Value Date/Time   CHOL 232 (H) 08/03/2019 0249   TRIG 128 08/03/2019 0249   HDL 31 (L) 08/03/2019 0249   CHOLHDL 7.5 08/03/2019 0249   VLDL 26 08/03/2019 0249   LDLCALC 175 (H) 08/03/2019 0249   LDLDIRECT 193.0 09/02/2016 0839    Lab Results  Component Value Date   HGBA1C 5.7 (H) 08/03/2019   No components found for: NTPROBNP Lab Results  Component Value Date   TSH 0.550 08/24/2019   TSH 0.75 08/23/2019   TSH 0.66 09/16/2017    FINAL MEDICATION LIST END OF ENCOUNTER: Meds ordered this encounter  Medications  . apixaban (ELIQUIS) 5 MG TABS tablet    Sig: Take 1 tablet (5 mg total) by mouth 2 (two) times daily.    Dispense:  60 tablet     Refill:  0  . carvedilol (COREG) 6.25 MG tablet    Sig: Take 1 tablet (6.25 mg total) by mouth 2 (two) times daily with a meal.    Dispense:  60 tablet    Refill:  0  . docusate sodium (COLACE) 100 MG capsule    Sig: Take 1 capsule (100 mg total) by mouth daily as needed for mild constipation.    Dispense:  30 capsule    Refill:  0  . DISCONTD: spironolactone (ALDACTONE) 25 MG tablet    Sig: Take 0.5 tablets (12.5 mg total) by mouth daily.    Dispense:  15 tablet    Refill:  0  . sacubitril-valsartan (ENTRESTO) 49-51 MG    Sig: Take 1 tablet by mouth 2 (two) times daily.    Dispense:  60 tablet    Refill:  0  . spironolactone (ALDACTONE) 25 MG tablet    Sig: Take 0.5 tablets (12.5 mg total) by mouth daily.    Dispense:  45 tablet    Refill:  1    Medications Discontinued During This Encounter  Medication Reason  . acetaminophen (TYLENOL) 160 MG/5ML elixir Discontinued by provider  . silver sulfADIAZINE (SILVADENE) 1 % cream Error  . pantoprazole (PROTONIX) 40 MG tablet Error  . nicotine polacrilex (NICORETTE) 4 MG gum Error  . loratadine (CLARITIN) 10 MG tablet Error  . docusate sodium (COLACE) 100 MG capsule Reorder  . carvedilol (COREG) 6.25 MG tablet Reorder  . spironolactone (ALDACTONE) 25 MG tablet Reorder  . apixaban (ELIQUIS) 5 MG TABS tablet Reorder  . sacubitril-valsartan (ENTRESTO) 49-51 MG Reorder  . spironolactone (ALDACTONE) 25 MG tablet Dose change     Current Outpatient Medications:  .  acetaminophen (TYLENOL) 500 MG tablet, Take 1,000 mg by mouth 2 (two) times daily as needed (pain)., Disp: , Rfl:  .  apixaban (ELIQUIS) 5 MG TABS tablet, Take 1 tablet (5 mg total) by mouth 2 (two) times daily., Disp: 60 tablet, Rfl: 0 .  aspirin 81 MG chewable tablet, Chew 1 tablet (81 mg total) by mouth daily., Disp: 30 tablet, Rfl: 0 .  carvedilol (COREG) 6.25 MG tablet, Take 1 tablet (6.25 mg total) by mouth 2 (two) times daily with a meal., Disp: 60 tablet, Rfl: 0 .   docusate sodium (COLACE) 100 MG capsule, Take 1 capsule (  100 mg total) by mouth daily as needed for mild constipation., Disp: 30 capsule, Rfl: 0 .  ezetimibe (ZETIA) 10 MG tablet, Take 1 tablet (10 mg total) by mouth daily., Disp: 30 tablet, Rfl: 0 .  nicotine (NICODERM CQ - DOSED IN MG/24 HOURS) 21 mg/24hr patch, Place 1 patch (21 mg total) onto the skin daily., Disp: 30 patch, Rfl: 0 .  Potassium Chloride ER 20 MEQ TBCR, Take 20 mEq by mouth daily., Disp: 30 tablet, Rfl: 0 .  rosuvastatin (CRESTOR) 10 MG tablet, Take 1 tablet (10 mg total) by mouth daily at 6 PM., Disp: 30 tablet, Rfl: 0 .  sacubitril-valsartan (ENTRESTO) 49-51 MG, Take 1 tablet by mouth 2 (two) times daily., Disp: 60 tablet, Rfl: 0 .  spironolactone (ALDACTONE) 25 MG tablet, Take 0.5 tablets (12.5 mg total) by mouth daily., Disp: 45 tablet, Rfl: 1  IMPRESSION:    ICD-10-CM   1. Acute on chronic HFrEF (heart failure with reduced ejection fraction) (HCC)  I50.23 EKG 12-Lead    carvedilol (COREG) 6.25 MG tablet    docusate sodium (COLACE) 100 MG capsule    Pro b natriuretic peptide (BNP)    Basic Metabolic Panel (BMET)    Magnesium    Pro b natriuretic peptide (BNP)    Pro b natriuretic peptide (BNP)    Magnesium    Basic Metabolic Panel (BMET)    sacubitril-valsartan (ENTRESTO) 49-51 MG    spironolactone (ALDACTONE) 25 MG tablet    DISCONTINUED: spironolactone (ALDACTONE) 25 MG tablet    CANCELED: Basic Metabolic Panel (BMET)    CANCELED: Magnesium    CANCELED: Pro b natriuretic peptide (BNP)  2. Nonischemic cardiomyopathy  I42.9   3. Hypokalemia  E87.6   4. Left bundle branch block  I44.7   5. Paroxysmal atrial fibrillation (HCC)  I48.0 apixaban (ELIQUIS) 5 MG TABS tablet  6. Long term current use of anticoagulant  Z79.01 apixaban (ELIQUIS) 5 MG TABS tablet  7. Mixed hyperlipidemia  E78.2   8. Essential hypertension  I10   9. History of CVA (cerebrovascular accident)  Z86.73   10. Former smoker  Z87.891   50.  History of cardiac arrest  Z86.74   12. Biventricular automatic implantable cardioverter defibrillator in situ  Z95.810      RECOMMENDATIONS: ISOLDE SKAFF is a 69 y.o. female who presents to the office with a chief complaint of " hospital follow-up." Patient's past medical history and cardiac risk factors include: Hypertension, hyperlipidemia, tobacco use, history of Arnold-Chiari malformation, history of VT/VF, cardiac arrest, nonischemic cardiomyopathy, peripheral vascular disease, paroxysmal atrial fibrillation, left bundle branch block, history of CVA.  Acute on chronic heart failure with reduced ejection fraction, stage C, NYHA class II/III: Improving  Continue guideline directed medical therapy.  Patient is requesting a refill on Aldactone, Coreg, Entresto.  Most recent labs reviewed.  Repeat labs Monday September 05, 2019.  Strict I's and O's, and daily weights recommended.  Fluid restriction to less than 2 L/day, sodium restriction to less than 1.5 g/day.  Nonischemic cardiomyopathy: See above  Status post BiV ICD status post ventricular fibrillation arrest:  Patient has a follow-up appointment with EP September 08, 2019 at 8:30 AM.  [place] CRT-D SureScan (serial Number [id number] )   Status post polymorphic VT/V. fib arrest: Status post BiV ICD implantation.  Continue up titration of guideline with medical therapy as hemodynamics and laboratory values allow.  Left bundle branch block: Continue to monitor.  Paroxysmal atrial fibrillation:  Currently normal sinus rhythm.  Rate control: Coreg.  Rhythm control: None.  Thromboembolic prophylaxis currently on Eliquis.  Patient does not endorse any evidence of bleeding.  CHA2DS2VASc score: 7. Annual stroke risk: 9.6% (congestive heart failure, hypertension, age, history of stroke, medical history of peripheral vascular disease, and gender)  Long-term oral anticoagulation use:  Indication  paroxysmal atrial fibrillation.  Patient did not endorse any evidence of bleeding  Mixed hyperlipidemia: Currently not at goal.  Currently on Crestor and Zetia.  We will continue to monitor.  Patient does not endorse any evidence of myalgias.  History of CVA: Educated on the importance of secondary prevention.  Will defer further management to primary and neurology.  Former smoker: Educated on the importance of continued smoking cessation.   Orders Placed This Encounter  Procedures  . Pro b natriuretic peptide (BNP)  . Basic Metabolic Panel (BMET)  . Magnesium  . Pro b natriuretic peptide (BNP)  . EKG 12-Lead   --Continue cardiac medications as reconciled in final medication list. --Return in about 4 weeks (around 09/30/2019).. Or sooner if needed. --Continue follow-up with your primary care physician regarding the management of your other chronic comorbid conditions.  Total time spent with patient was 40 minutes and greater than 50% of that time was spent in counseling and coordination care with the patient regarding complex decision making and discussion as state above.  This includes reviewing history and physical, radiological reports, heart catheterization reports, ICD report, reviewing the progress notes during her hospitalization, assessment of the patient and to further develop plan of care going forward.  Patient's questions and concerns were addressed to her and her husband's satisfaction. She and her husband voices understanding of the instructions provided during this encounter.   This note was created using a voice recognition software as a result there may be grammatical errors inadvertently enclosed that do not reflect the nature of this encounter. Every attempt is made to correct such errors.  Rex Kras, DO, [facility]. [place] Office: [phone]

## 2019-09-05 DIAGNOSIS — I5023 Acute on chronic systolic (congestive) heart failure: Secondary | ICD-10-CM | POA: Diagnosis not present

## 2019-09-06 LAB — BASIC METABOLIC PANEL
BUN/Creatinine Ratio: 8 — ABNORMAL LOW (ref 12–28)
BUN: 12 mg/dL (ref 8–27)
CO2: 24 mmol/L (ref 20–29)
Calcium: 9.4 mg/dL (ref 8.7–10.3)
Chloride: 102 mmol/L (ref 96–106)
Creatinine, Ser: 1.57 mg/dL — ABNORMAL HIGH (ref 0.57–1.00)
GFR calc Af Amer: 39 mL/min/{1.73_m2} — ABNORMAL LOW (ref >59)
GFR calc non Af Amer: 33 mL/min/{1.73_m2} — ABNORMAL LOW (ref >59)
Glucose: 96 mg/dL (ref 65–99)
Potassium: 4.9 mmol/L (ref 3.5–5.2)
Sodium: 138 mmol/L (ref 134–144)

## 2019-09-06 LAB — MAGNESIUM: Magnesium: 2 mg/dL (ref 1.6–2.3)

## 2019-09-06 LAB — PRO B NATRIURETIC PEPTIDE: NT-Pro BNP: 3622 pg/mL — ABNORMAL HIGH (ref 0–301)

## 2019-09-07 ENCOUNTER — Ambulatory Visit: Payer: Medicare HMO

## 2019-09-07 ENCOUNTER — Encounter: Payer: Medicare HMO | Admitting: Occupational Therapy

## 2019-09-08 ENCOUNTER — Ambulatory Visit (INDEPENDENT_AMBULATORY_CARE_PROVIDER_SITE_OTHER): Payer: Medicare HMO | Admitting: *Deleted

## 2019-09-08 ENCOUNTER — Other Ambulatory Visit: Payer: Self-pay

## 2019-09-08 DIAGNOSIS — I469 Cardiac arrest, cause unspecified: Secondary | ICD-10-CM | POA: Diagnosis not present

## 2019-09-08 DIAGNOSIS — I4901 Ventricular fibrillation: Secondary | ICD-10-CM | POA: Diagnosis not present

## 2019-09-08 DIAGNOSIS — Z95810 Presence of automatic (implantable) cardiac defibrillator: Secondary | ICD-10-CM

## 2019-09-08 DIAGNOSIS — I429 Cardiomyopathy, unspecified: Secondary | ICD-10-CM | POA: Diagnosis not present

## 2019-09-08 LAB — CUP PACEART INCLINIC DEVICE CHECK
Brady Statistic RA Percent Paced: 84.3 %
Brady Statistic RV Percent Paced: 93 %
Date Time Interrogation Session: 20210325130821
Implantable Lead Implant Date: 20210311
Implantable Lead Implant Date: 20210311
Implantable Lead Implant Date: 20210311
Implantable Lead Location: 753858
Implantable Lead Location: 753859
Implantable Lead Location: 753860
Implantable Lead Model: 4598
Implantable Lead Model: 5076
Implantable Pulse Generator Implant Date: 20210311
Lead Channel Setting Pacing Amplitude: 3.5 V
Lead Channel Setting Pacing Amplitude: 3.5 V
Lead Channel Setting Pacing Amplitude: 3.75 V
Lead Channel Setting Pacing Pulse Width: 0.4 ms
Lead Channel Setting Pacing Pulse Width: 0.6 ms
Lead Channel Setting Sensing Sensitivity: 0.3 mV

## 2019-09-08 NOTE — Telephone Encounter (Signed)
error 

## 2019-09-08 NOTE — Patient Instructions (Signed)
Call the office if you have any drainage, bleeding, or swelling at wound site. Call office if you have fever or chills.

## 2019-09-08 NOTE — Progress Notes (Signed)
Wound check appointment. Steri-strips removed. Wound without redness or edema. Incision edges approximated, wound well healed. Normal device function. Thresholds, sensing, and impedances consistent with implant measurements. Device programmed at 3.5V/auto capture programmed on for extra safety margin until 3 month visit.  Histogram distribution appropriate for patient and level of activity. AT/AF burden 0.2%, 2 AMS episodes that appear to be AF with controlled v-rates, longest episode 19 minutes and 59 seconds, + Eliquis. Blanking programmed to partial + due to A falling in blanking. .No high ventricular rates noted. Patient educated about wound care, arm mobility, lifting restrictions. ROV in 3 months with implanting physician on 11/29/19. Next remote 11/25/19.

## 2019-09-08 NOTE — Telephone Encounter (Signed)
Hey Tammy, do you know why I'm getting an error message when I'm trying to close this encounter?  Error: "Please document a reason why the note was not shared by utilizing the .BLOCKNOTESHARINGWITHPATIENT SmartPhrase in your note."

## 2019-09-09 ENCOUNTER — Other Ambulatory Visit: Payer: Self-pay

## 2019-09-09 ENCOUNTER — Telehealth: Payer: Self-pay

## 2019-09-09 ENCOUNTER — Other Ambulatory Visit: Payer: Self-pay | Admitting: Cardiology

## 2019-09-09 MED ORDER — EZETIMIBE 10 MG PO TABS: 10.0000 mg | ORAL_TABLET | Freq: Every day | ORAL | 0 refills | Status: DC

## 2019-09-09 NOTE — Telephone Encounter (Signed)
Pt called the refill dept today and left a message asking about a medication prescribed by [facility] Dr. Hermelinda Dellen. I informed the pt she needs to get in touch with their office in order for it to be filled due to the provider not working here. Pt thanked me for my time and apologized for any inconvenience.

## 2019-09-12 DIAGNOSIS — I5023 Acute on chronic systolic (congestive) heart failure: Secondary | ICD-10-CM | POA: Diagnosis not present

## 2019-09-13 LAB — BASIC METABOLIC PANEL
BUN/Creatinine Ratio: 9 — ABNORMAL LOW (ref 12–28)
BUN: 17 mg/dL (ref 8–27)
CO2: 21 mmol/L (ref 20–29)
Calcium: 9.4 mg/dL (ref 8.7–10.3)
Chloride: 103 mmol/L (ref 96–106)
Creatinine, Ser: 2 mg/dL — ABNORMAL HIGH (ref 0.57–1.00)
GFR calc Af Amer: 29 mL/min/{1.73_m2} — ABNORMAL LOW (ref >59)
GFR calc non Af Amer: 25 mL/min/{1.73_m2} — ABNORMAL LOW (ref >59)
Glucose: 92 mg/dL (ref 65–99)
Potassium: 5.3 mmol/L — ABNORMAL HIGH (ref 3.5–5.2)
Sodium: 137 mmol/L (ref 134–144)

## 2019-09-13 LAB — PRO B NATRIURETIC PEPTIDE: NT-Pro BNP: 1129 pg/mL — ABNORMAL HIGH (ref 0–301)

## 2019-09-13 LAB — MAGNESIUM: Magnesium: 2 mg/dL (ref 1.6–2.3)

## 2019-09-14 ENCOUNTER — Other Ambulatory Visit: Payer: Self-pay | Admitting: Cardiology

## 2019-09-14 ENCOUNTER — Other Ambulatory Visit: Payer: Self-pay

## 2019-09-14 DIAGNOSIS — I5023 Acute on chronic systolic (congestive) heart failure: Secondary | ICD-10-CM

## 2019-09-14 MED ORDER — ENTRESTO 24-26 MG PO TABS: 1.0000 | ORAL_TABLET | Freq: Two times a day (BID) | ORAL | Status: DC

## 2019-09-14 MED ORDER — SACUBITRIL-VALSARTAN 24-26 MG PO TABS: 1.0000 | ORAL_TABLET | Freq: Two times a day (BID) | ORAL | Status: DC

## 2019-09-14 MED ORDER — POTASSIUM CHLORIDE ER 20 MEQ PO TBCR: 0.5000 | EXTENDED_RELEASE_TABLET | Freq: Every day | ORAL | 0 refills | Status: DC

## 2019-09-15 ENCOUNTER — Other Ambulatory Visit: Payer: Self-pay

## 2019-09-15 ENCOUNTER — Encounter: Payer: Self-pay | Admitting: Adult Health

## 2019-09-15 ENCOUNTER — Ambulatory Visit: Payer: Medicare HMO | Admitting: Adult Health

## 2019-09-15 VITALS — BP 136/82 | HR 74 | Temp 97.4°F | Ht 64.0 in | Wt 151.0 lb

## 2019-09-15 DIAGNOSIS — I4891 Unspecified atrial fibrillation: Secondary | ICD-10-CM

## 2019-09-15 DIAGNOSIS — I639 Cerebral infarction, unspecified: Secondary | ICD-10-CM

## 2019-09-15 DIAGNOSIS — E785 Hyperlipidemia, unspecified: Secondary | ICD-10-CM | POA: Diagnosis not present

## 2019-09-15 DIAGNOSIS — I10 Essential (primary) hypertension: Secondary | ICD-10-CM | POA: Diagnosis not present

## 2019-09-15 DIAGNOSIS — G8194 Hemiplegia, unspecified affecting left nondominant side: Secondary | ICD-10-CM

## 2019-09-15 NOTE — Patient Instructions (Addendum)
Ongoing participation in outpatient therapies   Continue aspirin 81 mg daily and Eliquis (apixaban) daily  and Crestor and Zetia for secondary stroke prevention  Continue to follow up with PCP regarding cholesterol and blood pressure management   Continue to follow with cardiology as scheduled  Continue to monitor blood pressure at home  Maintain strict control of hypertension with blood pressure goal below 130/90, diabetes with hemoglobin A1c goal below 6.5% and cholesterol with LDL cholesterol (bad cholesterol) goal below 70 mg/dL. I also advised the patient to eat a healthy diet with plenty of whole grains, cereals, fruits and vegetables, exercise regularly and maintain ideal body weight.  Followup in the future with me in 3 months or call earlier if needed       Thank you for coming to see [place] at [facility]. I hope we have been able to provide you high quality care today.  You may receive a patient satisfaction survey over the next few weeks. We would appreciate your feedback and comments so that we may continue to improve ourselves and the health of our patients.

## 2019-09-15 NOTE — Progress Notes (Signed)
[facility] [address] [place]. [place] [address] [phone]       HOSPITAL FOLLOW UP NOTE  Ms. [name] Date of Birth:  April 10, 1951 Medical Record Number:  [id number]   Reason for Referral:  hospital stroke follow up    CHIEF COMPLAINT:  Chief Complaint  Patient presents with  . Follow-up    treatment rm, Hospital fu , with daughter    HPI:  Ms. [name] is a 69 y.o. female with history of Chiari I malformation s/p decompression, HLD, PVD, CKD, heavy tobacco use and HTN who presented on 08/02/2019 with dysphagia, L hemiparesis, dizziness and L facial pressure.  Evaluated by stroke team and Dr. Leonie Man with stroke work-up revealing large right basal ganglia infarct embolic secondary to new onset PAF.  Initiated Eliquis and aspirin 81 mg daily for secondary stroke prevention and new onset PAF.  HTN stable.  LDL 175 and recommend initiation of Zetia and Crestor with history of statin intolerance and possibly consider PCSK9 inhibitor if indicated outpatient.  Heavy tobacco use (about 3PPD) with smoking cessation counseling provided.  Evidence of prior strokes on imaging.  During admission on 08/05/2019, V. fib/torsades to points cardiac arrest and discharged home on LifeVest and follow-up with cardiology.  Also found new onset atypical LBBB/dilated cardiomyopathy with EF 25 to 30% and advised to follow with cardiology outpatient.  Residual deficits of dysphagia and mild left-sided weakness and discharged home with recommendation of outpatient therapies.  Stroke:   Large R basal ganglia infarct embolic secondary to paroxysmal atrial fibrillation  CT head age indeterminate R lentiform nucleus / external capsule infarct. Small R frontal lobe subcortical white matter hypoattenuation, ? Chronic infarct. Old R caudate infarct.  Chiari I malformation. Mild atrophy.   CT Maxillofacial no mass, small L mastoid effusion  MRI  Acute R basal ganglia infarct   MRA   Normal  Repeat CT head 08/04/2019 no acute finding.  Recent right basal ganglia infarct without significant worsening.  Carotid Doppler bilateral mild plaques and 1-39% carotid stenosis 2D Echo pending   LDL 175  HgbA1c 5.7  She returned on 08/14/2019 with speech difficulty and right hand weakness lasting a few minutes and then resolved likely due to left brain TIA.  No acute infarcts on imaging appreciated.  Recommended continuation of aspirin 81 mg daily, Eliquis and statin.  Possible left brain TIA  Code Stroke CT head subacute R basal ganglia infarct. ASPECTS 10.     MRI  Unchanged R basal ganglia and radiating white matter infarcts with evolvement since 08/02/19. No new infarct. Stable small vessel disease. And old B basal ganglia lacunes. Chiari I w/ prior decompression.  LDL 175  HgbA1c 5.7  Eliquis for VTE prophylaxis  aspirin 81 mg daily and Eliquis (apixaban) daily prior to admission, now on aspirin 325 mg daily and full dose lovenox. Recommend continuation of aspirin 81 and Eliquis and continue at d/c. No need for full dose lovenox from stroke perpsective  She returned again on 08/24/2019 with CC "LifeVest went off" with interrogation of LifeVest showing underlying rhythm ventricular fibrillation s/p placement of BiV ICD.   Today, 09/15/2019, [name] is being seen today for hospital follow-up accompanied by her daughter.  She has been doing well since recent hospitalizations and recovering well from a stroke standpoint.  Residual stroke deficits of mild left hemiparesis. Due to recent ICD placement, patient reports restriction of full ROM in left shoulder limiting ongoing stroke recovery but is able  to return to full ROM on 4/15. She does plan on restarting therapies next week.  Currently ambulating with a cane outdoors but no device needed inside her home.  She continues on aspirin and Eliquis without bleeding or bruising.  Continues on Zetia and Crestor without myalgias.   Blood pressure today 136/82. Endorses complete tobacco cessation since first admission.  No further concerns at this time.    ROS:   14 system review of systems performed and negative with exception of weakness  PMH:  Past Medical History:  Diagnosis Date  . Allergy   . Anxiety   . Arnold-Chiari malformation ([place])   . Arthritis   . CHF (congestive heart failure) ([place])   . Coronary artery disease   . GERD (gastroesophageal reflux disease)   . H. pylori infection    3 years ago  . H/O cardiac arrest    Hx of VT/Vfib arrest  . Hyperlipidemia   . Incontinence   . Paroxysmal atrial fibrillation ([place]) 08/06/2019  . Syncope and collapse    Per pt, denies passing out  . Tobacco use disorder   . Unspecified essential hypertension     PSH:  Past Surgical History:  Procedure Laterality Date  . APPENDECTOMY    . ARNOLD CHIARI SURGERY     neurocranial surgery  . BIV ICD INSERTION CRT-D N/A 08/25/2019   Procedure: BIV ICD INSERTION CRT-D;  Surgeon: Constance Haw, MD;  Location: [facility];  Service: Cardiovascular;  Laterality: N/A;  . BLEPHAROPLASTY     Bil  . C-EYE SURGERY PROCEDURE    . CARDIAC CATHETERIZATION    . CHOLECYSTECTOMY    . EYE SURGERY    . heart failure    . LEFT HEART CATH AND CORONARY ANGIOGRAPHY N/A 08/04/2019   Procedure: LEFT HEART CATH AND CORONARY ANGIOGRAPHY;  Surgeon: Adrian Prows, MD;  Location: [facility];  Service: Cardiovascular;  Laterality: N/A;  . MASS EXCISION Right 12/27/2013   Procedure: MINOR EXCISION OF RIGHT THUMB MUCOID CYST, DEBRIDEMENT OF INTERPHALANGEAL JOINT;  Surgeon: Cammie Sickle, MD;  Location: [facility];  Service: Orthopedics;  Laterality: Right;  . mass on thumb  right  . TOTAL KNEE ARTHROPLASTY Right 02/17/2017  . TOTAL KNEE ARTHROPLASTY Right 02/17/2017   Procedure: TOTAL KNEE ARTHROPLASTY;  Surgeon: Melrose Nakayama, MD;  Location: [facility];  Service: Orthopedics;  Laterality: Right;  . TUBAL  LIGATION      Social History:  Social History   Socioeconomic History  . Marital status: Married    Spouse name: Not on file  . Number of children: 2  . Years of education: Not on file  . Highest education level: Not on file  Occupational History  . Occupation: hair dresser  Tobacco Use  . Smoking status: Former Smoker    Packs/day: 1.00    Years: 50.00    Pack years: 50.00    Types: Cigarettes    Quit date: 08/08/2019    Years since quitting: 0.1  . Smokeless tobacco: Never Used  Substance and Sexual Activity  . Alcohol use: No    Alcohol/week: 0.0 standard drinks  . Drug use: No  . Sexual activity: Not on file  Other Topics Concern  . Not on file  Social History Narrative  . Not on file   Social Determinants of Health   Financial Resource Strain:   . Difficulty of Paying Living Expenses:   Food Insecurity:   . Worried About Running  Out of Food in the Last Year:   . [place] in the Last Year:   Transportation Needs:   . Lack of Transportation (Medical):   [name] Lack of Transportation (Non-Medical):   Physical Activity:   . Days of Exercise per Week:   . Minutes of Exercise per Session:   Stress:   . Feeling of Stress :   Social Connections:   . Frequency of Communication with Friends and Family:   . Frequency of Social Gatherings with Friends and Family:   . Attends Religious Services:   . Active Member of Clubs or Organizations:   . Attends Archivist Meetings:   [name] Marital Status:   Intimate Partner Violence:   . Fear of Current or Ex-Partner:   . Emotionally Abused:   [name] Physically Abused:   . Sexually Abused:     Family History:  Family History  Problem Relation Age of Onset  . Bone cancer Mother   . Heart disease Mother   . Cancer Sister        metastatic; unknown primary  . Diverticulosis Sister   . Tuberculosis Father   . Diabetes Sister   . Hypertension Sister   . Colon cancer Neg Hx   . Esophageal cancer Neg Hx   .  Pancreatic cancer Neg Hx   . Stomach cancer Neg Hx   . Liver disease Neg Hx   . Kidney disease Neg Hx   . Rectal cancer Neg Hx     Medications:   Current Outpatient Medications on File Prior to Visit  Medication Sig Dispense Refill  . acetaminophen (TYLENOL) 500 MG tablet Take 1,000 mg by mouth 2 (two) times daily as needed (pain).    [name] apixaban (ELIQUIS) 5 MG TABS tablet Take 1 tablet (5 mg total) by mouth 2 (two) times daily. 60 tablet 0  . aspirin 81 MG chewable tablet Chew 1 tablet (81 mg total) by mouth daily. 30 tablet 0  . carvedilol (COREG) 6.25 MG tablet Take 1 tablet (6.25 mg total) by mouth 2 (two) times daily with a meal. 60 tablet 0  . docusate sodium (COLACE) 100 MG capsule Take 1 capsule (100 mg total) by mouth daily as needed for mild constipation. 30 capsule 0  . ezetimibe (ZETIA) 10 MG tablet Take 1 tablet (10 mg total) by mouth daily. 30 tablet 0  . nicotine (NICODERM CQ - DOSED IN MG/24 HOURS) 21 mg/24hr patch PLACE 1 PATCH (21 MG TOTAL) ONTO THE SKIN DAILY. 28 patch 0  . Potassium Chloride ER 20 MEQ TBCR Take 0.5 tablets by mouth daily. 30 tablet 0  . rosuvastatin (CRESTOR) 10 MG tablet TAKE 1 TABLET (10 MG TOTAL) BY MOUTH DAILY AT 6 PM. 30 tablet 0  . sacubitril-valsartan (ENTRESTO) 24-26 MG Take 1 tablet by mouth 2 (two) times daily. 60 tablet   . spironolactone (ALDACTONE) 25 MG tablet Take 0.5 tablets (12.5 mg total) by mouth daily. 45 tablet 1   No current facility-administered medications on file prior to visit.    Allergies:   Allergies  Allergen Reactions  . Shellfish-Derived Products Anaphylaxis  . Ativan [Lorazepam] Other (See Comments)    Makes "skin crawl" and insomnia  . Buspar [Buspirone] Nausea Only    Sweating and dizzy  . Clarithromycin Swelling  . Codeine Nausea And Vomiting  . Doxycycline Other (See Comments)    Unknown  . Guaifenesin Other (See Comments)    Palpitations  . Oxycodone Nausea And Vomiting  .  Prednisone Nausea And Vomiting  and Other (See Comments)    Makes my heart race  . Statins Other (See Comments)    Muscle pain  . Sulfonamide Derivatives Nausea And Vomiting    Achiness  . Tetracycline Nausea Only  . Vicodin [Hydrocodone-Acetaminophen] Nausea And Vomiting  . Famotidine Rash  . Latex Rash  . Omeprazole Nausea Only    Cough, shortness of breath - patient doesn't remember  . Peanut-Containing Drug Products Itching and Rash  . Protonix [Pantoprazole] Rash  . Wellbutrin [Bupropion] Palpitations     Physical Exam  Vitals:   09/15/19 1511  BP: 136/82  Pulse: 74  Temp: (!) 97.4 F (36.3 C)  Weight: 151 lb (68.5 kg)  Height: 5\' 4"  (1.626 m)   Body mass index is 25.92 kg/m. No exam data present  General: well developed, well nourished,  pleasant elderly Caucasian female, seated, in no evident distress Head: head normocephalic and atraumatic.   Neck: supple with no carotid or supraclavicular bruits Cardiovascular: regular rate and rhythm, no murmurs Musculoskeletal: no deformity Skin:  no rash/petichiae Vascular:  Normal pulses all extremities   Neurologic Exam Mental Status: Awake and fully alert.   Normal speech and language.  Oriented to place and time. Recent and remote memory intact. Attention span, concentration and fund of knowledge appropriate. Mood and affect appropriate.  Cranial Nerves: Fundoscopic exam reveals sharp disc margins. Pupils equal, briskly reactive to light. Extraocular movements full without nystagmus. Visual fields full to confrontation. Hearing intact. Facial sensation intact. Face, tongue, palate moves normally and symmetrically.  Motor: Normal bulk and tone. Normal strength in all tested extremity muscles except mild left hip flexor weakness and decreased left hand dexterity.  Unable to test deltoid strength due to recent pacemaker placement Sensory.: intact to touch , pinprick , position and vibratory sensation.  Coordination: Rapid alternating movements normal in  all extremities except slightly decreased left hand. Finger-to-nose and heel-to-shin performed accurately bilaterally. Gait and Station: Arises from chair without difficulty. Stance is normal. Gait demonstrates normal stride length and balance with use of cane Reflexes: 1+ and symmetric. Toes downgoing.     NIHSS  0 Modified Rankin 2 CHA2DS2-VASc 7 HAS-BLED 2     ASSESSMENT: COPELAND [name] is a 69 y.o. year old female presented with dysphagia, left hemiparesis, dizziness and left facial pressure on 08/02/2019 with stroke work-up revealing large right basal ganglia infarct embolic secondary to new onset atrial fibrillation as well as possible TIA on 08/14/2019.  She had episode of V. fib cardiac arrest and discharged with a LifeVest resulting in eventual placement of ICD 08/25/2019.  Vascular risk factors include new onset PAF, HTN, HLD, old stroke on imaging, CAD w/ VFib arrest, dilated cardiomyopathy with EF 25 to 30%, CHF and Chiari malformation.  Residual stroke deficits of left hemiparesis but endorses ongoing improvement.    PLAN:  1. Right BG stroke:  a. Left hemiparesis, poststroke: Restart therapies next week for ongoing likely improvement b. Continue aspirin 81 mg daily and Eliquis (apixaban) daily  and Zetia and Crestor for secondary stroke prevention.  c. Maintain strict control of hypertension with blood pressure goal below 130/90, diabetes with hemoglobin A1c goal below 6.5% and cholesterol with LDL cholesterol (bad cholesterol) goal below 70 mg/dL.  I also advised the patient to eat a healthy diet with plenty of whole grains, cereals, fruits and vegetables, exercise regularly with at least 30 minutes of continuous activity daily and maintain ideal body weight. 2. HTN: Stable.  Continuation of current antihypertensives and ongoing follow-up with PCP/cardiology for monitoring management 3. HLD: Continuation of Crestor and Zetia.  Plans on following up in the near future with  PCP/cardiology for repeat lipid panel to ensure satisfactory management 4. New onset atrial fibrillation, CAD, cardiomyopathy and ICD placement: Continuation of Eliquis and aspirin 81 mg daily for secondary stroke prevention as well as ongoing follow-up with cardiology for monitoring and management 5. Hx heavy tobacco use: Endorses complete cessation and congratulated on accomplishment.  Highly encouraged ongoing cessation    Follow up in 3 months or call earlier if needed   I spent 45 minutes of face-to-face and non-face-to-face time with patient and daughter.  This included previsit chart review, lab review, study review, order entry, electronic health record documentation, patient education regarding recent stroke, new onset A. fib, importance of managing stroke risk factors and answered all questions to patient and daughter satisfaction    Frann Rider, AGNP-BC  [facility] [address] [place], [place] [address]  Phone [phone] Fax [phone] Note: This document was prepared with digital dictation and possible smart phrase technology. Any transcriptional errors that result from this process are unintentional.

## 2019-09-19 ENCOUNTER — Encounter: Payer: Medicare HMO | Admitting: Speech Pathology

## 2019-09-19 ENCOUNTER — Encounter: Payer: Self-pay | Admitting: Adult Health

## 2019-09-19 ENCOUNTER — Encounter: Payer: Medicare HMO | Admitting: Occupational Therapy

## 2019-09-19 ENCOUNTER — Ambulatory Visit: Payer: Medicare HMO | Admitting: Physical Therapy

## 2019-09-19 NOTE — Progress Notes (Signed)
I agree with the above plan 

## 2019-09-20 ENCOUNTER — Ambulatory Visit: Payer: Medicare HMO | Attending: Internal Medicine | Admitting: Physical Therapy

## 2019-09-20 ENCOUNTER — Telehealth: Payer: Self-pay

## 2019-09-20 ENCOUNTER — Encounter: Payer: Self-pay | Admitting: Physical Therapy

## 2019-09-20 ENCOUNTER — Other Ambulatory Visit: Payer: Self-pay

## 2019-09-20 ENCOUNTER — Other Ambulatory Visit: Payer: Self-pay | Admitting: Cardiology

## 2019-09-20 VITALS — BP 128/77 | HR 69

## 2019-09-20 DIAGNOSIS — R2689 Other abnormalities of gait and mobility: Secondary | ICD-10-CM | POA: Diagnosis not present

## 2019-09-20 DIAGNOSIS — R278 Other lack of coordination: Secondary | ICD-10-CM | POA: Insufficient documentation

## 2019-09-20 DIAGNOSIS — M6281 Muscle weakness (generalized): Secondary | ICD-10-CM | POA: Diagnosis not present

## 2019-09-20 DIAGNOSIS — E782 Mixed hyperlipidemia: Secondary | ICD-10-CM

## 2019-09-20 DIAGNOSIS — R2681 Unsteadiness on feet: Secondary | ICD-10-CM | POA: Diagnosis not present

## 2019-09-20 DIAGNOSIS — I69354 Hemiplegia and hemiparesis following cerebral infarction affecting left non-dominant side: Secondary | ICD-10-CM | POA: Insufficient documentation

## 2019-09-20 NOTE — Telephone Encounter (Signed)
Patient's daughter left a message asking if we can please add Cholesterol to pt lab work that is being performed tomorrow. Patient's PCP would like to monitor cholesterol, per daughter. Please advise. Thanks!

## 2019-09-20 NOTE — Telephone Encounter (Signed)
Okay. Please inform the patient to fast for 12hr prior to testing. I will place the order

## 2019-09-21 DIAGNOSIS — I5023 Acute on chronic systolic (congestive) heart failure: Secondary | ICD-10-CM | POA: Diagnosis not present

## 2019-09-21 DIAGNOSIS — E782 Mixed hyperlipidemia: Secondary | ICD-10-CM | POA: Diagnosis not present

## 2019-09-21 NOTE — Therapy (Signed)
[facility] [address] [place], [place], [address] Phone: [phone]   Fax:  [phone]  Physical Therapy Evaluation  Patient Details  Name: [name] MRN: [id number] Date of Birth: 03/10/1951 Referring Provider (PT): Rex Kras, DO   Encounter Date: 09/20/2019  PT End of Session - 09/21/19 0823    Visit Number  1    Number of Visits  9    Date for PT Re-Evaluation  11/20/19    Authorization Type  [employer] Medicare    PT Start Time  1532    PT Stop Time  1610    PT Time Calculation (min)  38 min    Equipment Utilized During Treatment  Gait belt    Activity Tolerance  Patient tolerated treatment well    Behavior During Therapy  [facility] for tasks assessed/performed       Past Medical History:  Diagnosis Date  . Allergy   . Anxiety   . Arnold-Chiari malformation ([place])   . Arthritis   . CHF (congestive heart failure) ([place])   . Coronary artery disease   . GERD (gastroesophageal reflux disease)   . H. pylori infection    3 years ago  . H/O cardiac arrest    Hx of VT/Vfib arrest  . Hyperlipidemia   . Incontinence   . Paroxysmal atrial fibrillation ([place]) 08/06/2019  . Syncope and collapse    Per pt, denies passing out  . Tobacco use disorder   . Unspecified essential hypertension     Past Surgical History:  Procedure Laterality Date  . APPENDECTOMY    . ARNOLD CHIARI SURGERY     neurocranial surgery  . BIV ICD INSERTION CRT-D N/A 08/25/2019   Procedure: BIV ICD INSERTION CRT-D;  Surgeon: Constance Haw, MD;  Location: [facility];  Service: Cardiovascular;  Laterality: N/A;  . BLEPHAROPLASTY     Bil  . C-EYE SURGERY PROCEDURE    . CARDIAC CATHETERIZATION    . CHOLECYSTECTOMY    . EYE SURGERY    . heart failure    . LEFT HEART CATH AND CORONARY ANGIOGRAPHY N/A 08/04/2019   Procedure: LEFT HEART CATH AND CORONARY ANGIOGRAPHY;  Surgeon: Adrian Prows, MD;  Location: [place] [facility];   Service: Cardiovascular;  Laterality: N/A;  . MASS EXCISION Right 12/27/2013   Procedure: MINOR EXCISION OF RIGHT THUMB MUCOID CYST, DEBRIDEMENT OF INTERPHALANGEAL JOINT;  Surgeon: Cammie Sickle, MD;  Location: [facility];  Service: Orthopedics;  Laterality: Right;  . mass on thumb  right  . TOTAL KNEE ARTHROPLASTY Right 02/17/2017  . TOTAL KNEE ARTHROPLASTY Right 02/17/2017   Procedure: TOTAL KNEE ARTHROPLASTY;  Surgeon: Melrose Nakayama, MD;  Location: [facility];  Service: Orthopedics;  Laterality: Right;  . TUBAL LIGATION      Vitals:   09/20/19 1542  BP: 128/77  Pulse: 69     Subjective Assessment - 09/20/19 1536    Subjective  Pt is a 69 y.o. female readmitted 08/14/19 with worsened LUE weakness. 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 On 2/18, pt noted to be in vfib without pulse, shocked and went into torsades and PEA; transfer to ICU; brief intubation 2/18.[name] MRI 08/15/19 showed some extension of recent CVA in R basal ganglia, no new CVA. Was seen for previous PT evaluation on 08/22/19 and then was admitted for cardiac arrest on 08/23/09, defibrillated by her life vest at home and now s/p ICD  on 08/25/19. Still no lifting with LUE >10 lbs. Can dress herself, put shoes on and cook. Getting in and out of bed has improved greatly. Walking with no AD. No falls, no almost falls. When she turns too fast thats when she notices she may be off balance. No dizziness.    Pertinent History  PMH includes anxiety, HTN, Chiari I malformation, a fib, CKD stage III, HLD, hx of R knee replacement, hx of sciatica for several years    How long can you stand comfortably?  approx. 2 hours of doing chores etc. before needing to lie down.    Patient Stated Goals  she wants to get stronger    Currently in Pain?  No/denies         [facility] PT Assessment - 09/20/19 1544      Assessment   Medical Diagnosis  BG stroke    Referring Provider (PT)  Rex Kras, DO    Onset  Date/Surgical Date  08/04/19    Hand Dominance  Right    Prior Therapy  seen for eval       Precautions   Precautions  Fall    Precaution Comments  no lifting >10 lbs with LUE, blood thinners      Balance Screen   Has the patient fallen in the past 6 months  No    Has the patient had a decrease in activity level because of a fear of falling?   No   decrease in activity level due to fatigue   Is the patient reluctant to leave their home because of a fear of falling?   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    Leisure  cooking, Control and instrumentation engineer, spending time with grandson, Print production planner Movements are Fluid and Coordinated  Yes      ROM / Strength   AROM / PROM / Strength  Strength      Strength   Right Hip Flexion  4+/5    Left Hip Flexion  4/5    Right Knee Flexion  4+/5    Right Knee Extension  5/5    Left Knee Flexion  4+/5    Left Knee Extension  4+/5    Right Ankle Dorsiflexion  5/5    Left Ankle Dorsiflexion  5/5      Transfers   Transfers  Sit to Stand;Stand to Sit    Sit to Stand  5: Supervision;Without upper extremity assist    Comments  30 second chair stand: 7 sit <> stands, pain in L knee during completion      Ambulation/Gait   Ambulation/Gait  Yes    Ambulation/Gait Assistance  5: Supervision    Assistive device  None    Gait Pattern   Step-through pattern;Narrow base of support;Decreased weight shift to left;Decreased arm swing - right;Decreased arm swing - left    Ambulation Surface  Level;Indoor    Gait velocity  14.63 seconds = 2.24 ft/sec    Stairs  Yes    Stairs Assistance  5: Supervision    Stair Management Technique  One rail Right;Step to pattern;Forwards    Number of Stairs  4      6 Minute Walk- Baseline   6 Minute Walk- Baseline  --      Functional Gait  Assessment   Gait assessed   Yes    Gait Level Surface  Walks 20 ft, slow speed, abnormal gait pattern, evidence for imbalance or deviates 10-15 in outside of the 12 in walkway width. Requires more than 7 sec to ambulate 20 ft.    Change in Gait Speed  Able to smoothly change walking speed without loss of balance or gait deviation. Deviate no more than 6 in outside of the 12 in walkway width.    Gait with Horizontal Head Turns  Performs head turns smoothly with slight change in gait velocity (eg, minor disruption to smooth gait path), deviates 6-10 in outside 12 in walkway width, or uses an assistive device.    Gait with Vertical Head Turns  Performs task with slight change in gait velocity (eg, minor disruption to smooth gait path), deviates 6 - 10 in outside 12 in walkway width or uses assistive device    Gait and Pivot Turn  Pivot turns safely within 3 sec and stops quickly with no loss of balance.    Step Over Obstacle  Is able to step over one shoe box (4.5 in total height) but must slow down and adjust steps to clear box safely. May require verbal cueing.    Gait with Narrow Base of Support  Ambulates 7-9 steps.    Gait with Eyes Closed  Walks 20 ft, slow speed, abnormal gait pattern, evidence for imbalance, deviates 10-15 in outside 12 in walkway width. Requires more than 9 sec to ambulate 20 ft.    Ambulating Backwards  Walks 20 ft, slow speed, abnormal gait pattern, evidence for imbalance, deviates 10-15 in outside 12 in walkway width.    Steps  Two feet  to a stair, must use rail.    Total Score  17    FGA comment:  17/30   high fall risk               Objective measurements completed on examination: See above findings.              PT Education - 09/20/19 1541    Education Details  Patient educated on clinical findings and POC    Person(s) Educated  Patient    Methods  Explanation    Comprehension  Verbalized understanding       PT Short Term Goals - 09/21/19 0824      PT SHORT TERM GOAL #1   Title  Pt will be independent with initial HEP in order to build upon functional gains made in PT. ALL STGS DUE 10/05/19    Time  2    Period  Weeks    Target Date  10/05/19      PT SHORT TERM GOAL #2   Title  Pt will undergo further assessment of 6MWT to determine endurance for walking - LTG to be  written as appropriate.    Time  2    Status  New        PT Long Term Goals - 09/21/19 0867      PT LONG TERM GOAL #1   Title  Pt will be independent with final HEP in order to build upon functional gains made in PT.  ALL LTGS DUE 10/19/19    Time  4    Period  Weeks    Status  New    Target Date  10/19/19      PT LONG TERM GOAL #2   Title  Pt will improve FGA to at least a 23/30 in order to demo decr fall risk.    Baseline  17/30 on 09/20/19    Time  4    Period  Weeks    Status  New      PT LONG TERM GOAL #3   Title  6MWT goal to be written as appropriate to improve endurance with walking .    Time  4    Period  Weeks    Status  New      PT LONG TERM GOAL #4   Title  Pt will perform at least 10 sit <> stands in 30 seconds with no UE support from standard chair in order to demo improved functional LE strength.    Time  4    Period  Weeks    Status  New      PT LONG TERM GOAL #5   Title  Pt will improve gait speed with no AD to at least 2.5 ft/sec in order to demo improved gait speed and efficiency.    Baseline  2.24 ft/sec    Time  4    Period  Weeks    Status  New             Plan - 09/21/19  0829    Clinical Impression Statement  Patient is a 69 year old female referred to Neuro OPPT for evaluation s/p recent BG Infarct- 12/02/48 - with complications during hospitaliztion - respratory arrest, on vent in ICU. Patient returned to hospital on 2/28 with TIA like symptoms. Pt seen on 08/22/19 for initial PT evaluation - was then admitted for cardiac arrest on 08/24/19, defibrillated by her life vest at home and now s/p ICD on 08/25/19. Pt now cleared to resume therapy by physician. Pt's PMH is significant for: recent BG Infarct- 9/32/67 - with complications during hospitaliztion - respratory arrest, on vent in ICU. The following deficits were present during the exam: gait abnormalities, decreased functional LE strength, decreased endurance, impaired dynamic balance. Pt's FGA score indicates pt is at a high risk for falls. Pt's gait speed of 2.24 ft/sec indicates pt is a limited community ambulator. Pt would benefit from skilled PT to address these impairments and functional limitations to maximize functional mobility independence and decr fall risk.    Personal Factors and Comorbidities  Comorbidity 3+;Past/Current Experience    Comorbidities  anxiety, HTN, Chiari I malformation, a fib, CKD stage III, HLD    Examination-Activity Limitations  Stairs;Locomotion Level    Stability/Clinical Decision Making  Stable/Uncomplicated    Clinical Decision Making  Low    Rehab Potential  Good    PT Frequency  2x / week    PT Duration  4 weeks    PT Treatment/Interventions  ADLs/Self Care Home Management;Therapeutic exercise;Therapeutic activities;Functional mobility training;Stair training;Gait training;DME Instruction;Balance training;Neuromuscular re-education;Patient/family education    PT Next  Visit Plan  perform 6MWT and write LTG as appropriate. initial HEP for LE strengthening, balance. gait with no AD.       Patient will benefit from skilled therapeutic intervention in order to improve the following  deficits and impairments:  Abnormal gait, Decreased balance, Decreased activity tolerance, Difficulty walking, Decreased strength, Decreased endurance  Visit Diagnosis: Muscle weakness (generalized)  Unsteadiness on feet  Other lack of coordination  Hemiplegia and hemiparesis following cerebral infarction affecting left non-dominant side [facility])     Problem List Patient Active Problem List   Diagnosis Date Noted  . Pruritus 09/01/2019  . Biventricular ICD (implantable cardioverter-defibrillator) in place   . Cardiac arrest with ventricular fibrillation ([place]) 08/24/2019  . TIA (transient ischemic attack) 08/15/2019  . Long term current use of anticoagulant 08/14/2019  . Cardiomyopathy ([place]) 08/14/2019  . Paroxysmal atrial fibrillation ([place]) 08/06/2019  . Statin intolerance 08/03/2019  . Dysphagia   . History of CVA (cerebrovascular accident) 08/02/2019  . Hypokalemia 08/02/2019  . Left bundle branch block 08/02/2019  . CKD (chronic kidney disease) stage 3, GFR 30-59 ml/min 12/13/2018  . Primary localized osteoarthritis of right knee 02/17/2017  . Primary osteoarthritis of right knee 02/17/2017  . Preventative health care 09/01/2016  . Vitamin B12 deficiency 09/10/2015  . Insomnia 07/16/2015  . GERD (gastroesophageal reflux disease) 03/26/2015  . Fatigue 10/02/2014  . Other malaise and fatigue 02/08/2014  . HLD (hyperlipidemia) 11/19/2011  . Vaginal dryness, menopausal 11/19/2011  . Adjustment disorder 01/08/2011  . Former smoker 12/19/2008  . Essential hypertension 12/19/2008    Arliss Journey, PT, DPT  09/21/2019, 8:37 AM  [facility] [address] [place], [place], [address] Phone: [phone]   Fax:  [phone]  Name: DESIREE DAISE MRN: [id number] Date of Birth: 1951-04-23

## 2019-09-22 ENCOUNTER — Other Ambulatory Visit: Payer: Self-pay | Admitting: Cardiology

## 2019-09-22 DIAGNOSIS — I5023 Acute on chronic systolic (congestive) heart failure: Secondary | ICD-10-CM

## 2019-09-22 LAB — BASIC METABOLIC PANEL
BUN/Creatinine Ratio: 11 — ABNORMAL LOW (ref 12–28)
BUN: 20 mg/dL (ref 8–27)
CO2: 20 mmol/L (ref 20–29)
Calcium: 9.4 mg/dL (ref 8.7–10.3)
Chloride: 105 mmol/L (ref 96–106)
Creatinine, Ser: 1.84 mg/dL — ABNORMAL HIGH (ref 0.57–1.00)
GFR calc Af Amer: 32 mL/min/{1.73_m2} — ABNORMAL LOW (ref >59)
GFR calc non Af Amer: 28 mL/min/{1.73_m2} — ABNORMAL LOW (ref >59)
Glucose: 96 mg/dL (ref 65–99)
Potassium: 5.3 mmol/L — ABNORMAL HIGH (ref 3.5–5.2)
Sodium: 140 mmol/L (ref 134–144)

## 2019-09-22 LAB — LIPID PANEL WITH LDL/HDL RATIO
Cholesterol, Total: 114 mg/dL (ref 100–199)
HDL: 33 mg/dL — ABNORMAL LOW (ref >39)
LDL Chol Calc (NIH): 55 mg/dL (ref 0–99)
LDL/HDL Ratio: 1.7 ratio (ref 0.0–3.2)
Triglycerides: 147 mg/dL (ref 0–149)
VLDL Cholesterol Cal: 26 mg/dL (ref 5–40)

## 2019-09-22 LAB — MAGNESIUM: Magnesium: 2 mg/dL (ref 1.6–2.3)

## 2019-09-22 LAB — PRO B NATRIURETIC PEPTIDE: NT-Pro BNP: 1597 pg/mL — ABNORMAL HIGH (ref 0–301)

## 2019-09-23 ENCOUNTER — Ambulatory Visit: Payer: Medicare HMO

## 2019-09-23 ENCOUNTER — Other Ambulatory Visit: Payer: Self-pay

## 2019-09-23 VITALS — BP 120/72 | HR 69

## 2019-09-23 DIAGNOSIS — M6281 Muscle weakness (generalized): Secondary | ICD-10-CM

## 2019-09-23 DIAGNOSIS — R2681 Unsteadiness on feet: Secondary | ICD-10-CM | POA: Diagnosis not present

## 2019-09-23 DIAGNOSIS — R278 Other lack of coordination: Secondary | ICD-10-CM | POA: Diagnosis not present

## 2019-09-23 DIAGNOSIS — I69354 Hemiplegia and hemiparesis following cerebral infarction affecting left non-dominant side: Secondary | ICD-10-CM

## 2019-09-23 DIAGNOSIS — R2689 Other abnormalities of gait and mobility: Secondary | ICD-10-CM | POA: Diagnosis not present

## 2019-09-23 NOTE — Therapy (Signed)
[facility] [address] [place], [place], [address] Phone: [phone]   Fax:  [phone]  Physical Therapy Treatment  Patient Details  Name: [name] MRN: [id number] Date of Birth: 1950/12/23 Referring Provider (PT): Rex Kras, DO   Encounter Date: 09/23/2019  PT End of Session - 09/23/19 1402    Visit Number  2    Number of Visits  9    Date for PT Re-Evaluation  11/20/19    Authorization Type  Aetna Medicare    PT Start Time  1401    PT Stop Time  1444    PT Time Calculation (min)  43 min    Equipment Utilized During Treatment  Gait belt    Activity Tolerance  Patient tolerated treatment well    Behavior During Therapy  [facility] for tasks assessed/performed       Past Medical History:  Diagnosis Date  . Allergy   . Anxiety   . Arnold-Chiari malformation ([place])   . Arthritis   . CHF (congestive heart failure) ([place])   . Coronary artery disease   . GERD (gastroesophageal reflux disease)   . H. pylori infection    3 years ago  . H/O cardiac arrest    Hx of VT/Vfib arrest  . Hyperlipidemia   . Incontinence   . Paroxysmal atrial fibrillation ([place]) 08/06/2019  . Syncope and collapse    Per pt, denies passing out  . Tobacco use disorder   . Unspecified essential hypertension     Past Surgical History:  Procedure Laterality Date  . APPENDECTOMY    . ARNOLD CHIARI SURGERY     neurocranial surgery  . BIV ICD INSERTION CRT-D N/A 08/25/2019   Procedure: BIV ICD INSERTION CRT-D;  Surgeon: Constance Haw, MD;  Location: [facility];  Service: Cardiovascular;  Laterality: N/A;  . BLEPHAROPLASTY     Bil  . C-EYE SURGERY PROCEDURE    . CARDIAC CATHETERIZATION    . CHOLECYSTECTOMY    . EYE SURGERY    . heart failure    . LEFT HEART CATH AND CORONARY ANGIOGRAPHY N/A 08/04/2019   Procedure: LEFT HEART CATH AND CORONARY ANGIOGRAPHY;  Surgeon: Adrian Prows, MD;  Location: [place] [facility];   Service: Cardiovascular;  Laterality: N/A;  . MASS EXCISION Right 12/27/2013   Procedure: MINOR EXCISION OF RIGHT THUMB MUCOID CYST, DEBRIDEMENT OF INTERPHALANGEAL JOINT;  Surgeon: Cammie Sickle, MD;  Location: [facility];  Service: Orthopedics;  Laterality: Right;  . mass on thumb  right  . TOTAL KNEE ARTHROPLASTY Right 02/17/2017  . TOTAL KNEE ARTHROPLASTY Right 02/17/2017   Procedure: TOTAL KNEE ARTHROPLASTY;  Surgeon: Melrose Nakayama, MD;  Location: [facility];  Service: Orthopedics;  Laterality: Right;  . TUBAL LIGATION      Vitals:   09/23/19 1407  BP: 120/72  Pulse: 69    Subjective Assessment - 09/23/19 1403    Subjective  Pt. reporting no new complaints. Has had a headache, reports it due to allergies and pollen. No falls. No pain today.    Pertinent History  PMH includes anxiety, HTN, Chiari I malformation, a fib, CKD stage III, HLD, hx of R knee replacement, hx of sciatica for several years    How long can you stand comfortably?  approx. 2 hours of doing chores etc. before needing to lie down.    Patient Stated Goals  she wants to get stronger    Currently in Pain?  No/denies         [facility] PT Assessment - 09/23/19 0001      6 Minute Walk- Baseline   6 Minute Walk- Baseline  yes    Modified Borg Scale for Dyspnea  0- Nothing at all    Perceived Rate of Exertion (Borg)  --      6 Minute walk- Post Test   6 Minute Walk Post Test  yes    Modified Borg Scale for Dyspnea  2- Mild shortness of breath      6 minute walk test results    Aerobic Endurance Distance Walked  975    Endurance additional comments  very slow gait speed throughout entiretly of 6MWT                   [facility] Adult PT Treatment/Exercise - 09/23/19 0001      Transfers   Transfers  Sit to Stand;Stand to Sit    Sit to Stand  5: Supervision    Sit to Stand Details  Verbal cues for technique   verbal cues for upright posture   Sit to Stand Details (indicate cue type and  reason)  sit <> stand x 10 reps with use of UE from mat      Neuro Re-ed    Neuro Re-ed Details   Completed Mod CTSIB - Patient able to hold Situation 1, Situation 2, and Situation 3 all for 30 seconds each. Situation 4 was attempted with with two trials, trial 1 - 21 seconds and trial 2 - 23 seconds. Increased sway noted in Situation 2 and 4 with visual input removed. Supervision required, with intermittent CGA for steadying in Situation 4.       Exercises   Exercises  Other Exercises;Knee/Hip      Knee/Hip Exercises: Standing   Hip Abduction  Stengthening;Both;1 set;10 reps;Knee straight    Abduction Limitations  holding onto counter     Hip Extension  Stengthening;Both;1 set;10 reps;Knee straight    Extension Limitations  holding onto counter    Other Standing Knee Exercises  Standing Sidestepping at countertop 4 x 8', verbal cues on improved step length.       Knee/Hip Exercises: Supine   Bridges  Strengthening;Both;1 set;10 reps    Bridges Limitations  verbal cues required for technique.              PT Education - 09/23/19 1452    Education Details  Patient educated on initial HEP (see patient instructions for details)    Person(s) Educated  Patient    Methods  Explanation;Handout;Demonstration    Comprehension  Verbalized understanding       PT Short Term Goals - 09/23/19 1515      PT SHORT TERM GOAL #1   Title  Pt will be independent with initial HEP in order to build upon functional gains made in PT. ALL STGS DUE 10/05/19    Time  2    Period  Weeks    Target Date  10/05/19      PT SHORT TERM GOAL #2   Title  Pt will undergo further assessment of 6MWT to determine endurance for walking - LTG to be written as appropriate.    Time  2    Status  Achieved        PT Long Term Goals - 09/23/19 1513      PT LONG TERM GOAL #1   Title  Pt will be independent with final HEP in  order to build upon functional gains made in PT.  ALL LTGS DUE 10/19/19    Time  4     Period  Weeks    Status  New      PT LONG TERM GOAL #2   Title  Pt will improve FGA to at least a 23/30 in order to demo decr fall risk.    Baseline  17/30 on 09/20/19    Time  4    Period  Weeks    Status  New      PT LONG TERM GOAL #3   Title  Paitent will ambulate 1,500 ft in 6 minutes without an AD to improve endurance with walking.    Baseline  975 ft - 09/23/19    Time  4    Period  Weeks    Status  New      PT LONG TERM GOAL #4   Title  Pt will perform at least 10 sit <> stands in 30 seconds with no UE support from standard chair in order to demo improved functional LE strength.    Time  4    Period  Weeks    Status  New      PT LONG TERM GOAL #5   Title  Pt will improve gait speed with no AD to at least 2.5 ft/sec in order to demo improved gait speed and efficiency.    Baseline  2.24 ft/sec    Time  4    Period  Weeks    Status  New            Plan - 09/23/19 1505    Clinical Impression Statement  Today's session focused on further assessment of endurance with 6MWT and balance with M-CTSIB. For the 6MWT, the patient ambulated 975 ft (297.18 meters), the age related norms for this individual is 538 meters. The patient maintained a very slow gait speed throughout the entirety of the 6MWT. Pt demonstrating instability today on complaint surfaces with eyes closed, further assessment of balance at next session. Also focused on LE strengthening in today's session, and initiated a HEP program. Pt will continue to benefit from skilled PT services to address endurance, balance, and functional mobility deficits.    Personal Factors and Comorbidities  Comorbidity 3+;Past/Current Experience    Comorbidities  anxiety, HTN, Chiari I malformation, a fib, CKD stage III, HLD    Examination-Activity Limitations  Stairs;Locomotion Level    Stability/Clinical Decision Making  Stable/Uncomplicated    Rehab Potential  Good    PT Frequency  2x / week    PT Duration  4 weeks    PT  Treatment/Interventions  ADLs/Self Care Home Management;Therapeutic exercise;Therapeutic activities;Functional mobility training;Stair training;Gait training;DME Instruction;Balance training;Neuromuscular re-education;Patient/family education    PT Next Visit Plan  Include further balance assessment and incorporate into HEP. Continue gait without AD, and strengthening.    PT Home Exercise Plan  [email or web address]       Patient will benefit from skilled therapeutic intervention in order to improve the following deficits and impairments:  Abnormal gait, Decreased balance, Decreased activity tolerance, Difficulty walking, Decreased strength, Decreased endurance  Visit Diagnosis: Muscle weakness (generalized)  Unsteadiness on feet  Other lack of coordination  Hemiplegia and hemiparesis following cerebral infarction affecting left non-dominant side [facility])     Problem List Patient Active Problem List   Diagnosis Date Noted  . Pruritus 09/01/2019  . Biventricular ICD (implantable cardioverter-defibrillator) in place   . Cardiac arrest with ventricular fibrillation (  [place]) 08/24/2019  . TIA (transient ischemic attack) 08/15/2019  . Long term current use of anticoagulant 08/14/2019  . Cardiomyopathy ([place]) 08/14/2019  . Paroxysmal atrial fibrillation ([place]) 08/06/2019  . Statin intolerance 08/03/2019  . Dysphagia   . History of CVA (cerebrovascular accident) 08/02/2019  . Hypokalemia 08/02/2019  . Left bundle branch block 08/02/2019  . CKD (chronic kidney disease) stage 3, GFR 30-59 ml/min 12/13/2018  . Primary localized osteoarthritis of right knee 02/17/2017  . Primary osteoarthritis of right knee 02/17/2017  . Preventative health care 09/01/2016  . Vitamin B12 deficiency 09/10/2015  . Insomnia 07/16/2015  . GERD (gastroesophageal reflux disease) 03/26/2015  . Fatigue 10/02/2014  . Other malaise and fatigue 02/08/2014  . HLD (hyperlipidemia) 11/19/2011  . Vaginal dryness, menopausal 11/19/2011   . Adjustment disorder 01/08/2011  . Former smoker 12/19/2008  . Essential hypertension 12/19/2008    Jones Bales, PT, DPT 09/23/2019, 3:21 PM  [facility] [address] [place], [place], [address] Phone: [phone]   Fax:  [phone]  Name: [name] MRN: [id number] Date of Birth: [birth year]

## 2019-09-23 NOTE — Patient Instructions (Signed)
Access Code: [email or web address] URL: [email or web address] Date: 09/23/2019 Prepared by: Baldomero Lamy  Exercises Sit to Stand - 1 x daily - 7 x weekly - 10 reps - 3 sets Supine Bridge - 1 x daily - 7 x weekly - 10 reps - 3 sets Side Stepping with Counter Support - 1 x daily - 7 x weekly - 10 reps - 3 sets Standing Hip Abduction with Counter Support - 1 x daily - 7 x weekly - 10 reps - 3 sets Standing Hip Extension with Counter Support - 1 x daily - 7 x weekly - 10 reps - 3 sets

## 2019-09-25 ENCOUNTER — Other Ambulatory Visit: Payer: Self-pay | Admitting: Cardiology

## 2019-09-25 DIAGNOSIS — I5023 Acute on chronic systolic (congestive) heart failure: Secondary | ICD-10-CM

## 2019-09-26 ENCOUNTER — Other Ambulatory Visit: Payer: Self-pay | Admitting: Cardiology

## 2019-09-26 DIAGNOSIS — I480 Paroxysmal atrial fibrillation: Secondary | ICD-10-CM

## 2019-09-26 DIAGNOSIS — Z7901 Long term (current) use of anticoagulants: Secondary | ICD-10-CM

## 2019-09-26 DIAGNOSIS — I5023 Acute on chronic systolic (congestive) heart failure: Secondary | ICD-10-CM

## 2019-09-27 ENCOUNTER — Other Ambulatory Visit: Payer: Self-pay

## 2019-09-27 ENCOUNTER — Ambulatory Visit: Payer: Medicare HMO

## 2019-09-27 VITALS — BP 118/64 | HR 71

## 2019-09-27 DIAGNOSIS — I69354 Hemiplegia and hemiparesis following cerebral infarction affecting left non-dominant side: Secondary | ICD-10-CM

## 2019-09-27 DIAGNOSIS — R278 Other lack of coordination: Secondary | ICD-10-CM | POA: Diagnosis not present

## 2019-09-27 DIAGNOSIS — R2681 Unsteadiness on feet: Secondary | ICD-10-CM

## 2019-09-27 DIAGNOSIS — M6281 Muscle weakness (generalized): Secondary | ICD-10-CM

## 2019-09-27 DIAGNOSIS — R2689 Other abnormalities of gait and mobility: Secondary | ICD-10-CM | POA: Diagnosis not present

## 2019-09-27 NOTE — Patient Instructions (Signed)
Access Code: [email or web address] URL: [email or web address] Date: 09/27/2019 Prepared by: Baldomero Lamy  Exercises Sit to Stand - 1 x daily - 7 x weekly - 10 reps - 3 sets Supine Bridge - 1 x daily - 7 x weekly - 10 reps - 3 sets Side Stepping with Counter Support - 1 x daily - 7 x weekly - 10 reps - 3 sets Standing Hip Abduction with Counter Support - 1 x daily - 7 x weekly - 10 reps - 3 sets Standing Hip Extension with Counter Support - 1 x daily - 7 x weekly - 10 reps - 3 sets Seated Hamstring Stretch - 1 x daily - 7 x weekly - 3 sets - 3 reps - 30 hold Seated Piriformis Stretch - 1 x daily - 7 x weekly - 3 sets - 3 reps - 30 hold

## 2019-09-27 NOTE — Therapy (Signed)
[facility] [address] [place], [place], [address] Phone: [phone]   Fax:  [phone]  Physical Therapy Treatment  Patient Details  Name: [name] MRN: [id number] Date of Birth: 23-Dec-1950 Referring Provider (PT): Rex Kras, DO   Encounter Date: 09/27/2019  PT End of Session - 09/27/19 1319    Visit Number  3    Number of Visits  9    Date for PT Re-Evaluation  11/20/19    Authorization Type  [employer]    PT Start Time  1316    PT Stop Time  1358    PT Time Calculation (min)  42 min    Equipment Utilized During Treatment  Gait belt    Activity Tolerance  Patient tolerated treatment well    Behavior During Therapy  [facility] for tasks assessed/performed       Past Medical History:  Diagnosis Date  . Allergy   . Anxiety   . Arnold-Chiari malformation ([place])   . Arthritis   . CHF (congestive heart failure) ([place])   . Coronary artery disease   . GERD (gastroesophageal reflux disease)   . H. pylori infection    3 years ago  . H/O cardiac arrest    Hx of VT/Vfib arrest  . Hyperlipidemia   . Incontinence   . Paroxysmal atrial fibrillation ([place]) 08/06/2019  . Syncope and collapse    Per pt, denies passing out  . Tobacco use disorder   . Unspecified essential hypertension     Past Surgical History:  Procedure Laterality Date  . APPENDECTOMY    . ARNOLD CHIARI SURGERY     neurocranial surgery  . BIV ICD INSERTION CRT-D N/A 08/25/2019   Procedure: BIV ICD INSERTION CRT-D;  Surgeon: Constance Haw, MD;  Location: [facility];  Service: Cardiovascular;  Laterality: N/A;  . BLEPHAROPLASTY     Bil  . C-EYE SURGERY PROCEDURE    . CARDIAC CATHETERIZATION    . CHOLECYSTECTOMY    . EYE SURGERY    . heart failure    . LEFT HEART CATH AND CORONARY ANGIOGRAPHY N/A 08/04/2019   Procedure: LEFT HEART CATH AND CORONARY ANGIOGRAPHY;  Surgeon: Adrian Prows, MD;  Location: [facility];   Service: Cardiovascular;  Laterality: N/A;  . MASS EXCISION Right 12/27/2013   Procedure: MINOR EXCISION OF RIGHT THUMB MUCOID CYST, DEBRIDEMENT OF INTERPHALANGEAL JOINT;  Surgeon: Cammie Sickle, MD;  Location: [facility];  Service: Orthopedics;  Laterality: Right;  . mass on thumb  right  . TOTAL KNEE ARTHROPLASTY Right 02/17/2017  . TOTAL KNEE ARTHROPLASTY Right 02/17/2017   Procedure: TOTAL KNEE ARTHROPLASTY;  Surgeon: Melrose Nakayama, MD;  Location: [facility];  Service: Orthopedics;  Laterality: Right;  . TUBAL LIGATION      Vitals:   09/27/19 1325 09/27/19 1349  BP: 105/61 118/64  Pulse: 71 71    Subjective Assessment - 09/27/19 1319    Subjective  No falls, and no new complaints. Reporting a headache today, continues to report that she believes it is due to allergies. Reports that HEP is going well. Took a walk in the neighborhood and reports it went well. Has been having some tightness in L hip as well.    Pertinent History  PMH includes anxiety, HTN, Chiari I malformation, a fib, CKD stage III, HLD, hx of R knee replacement, hx of sciatica for several years    How long can you stand comfortably?  approx. 2 hours of doing chores etc. before needing to lie down.    Patient Stated Goals  she wants to get stronger    Currently in Pain?  No/denies                       [facility] Adult PT Treatment/Exercise - 09/27/19 1402      Ambulation/Gait   Ambulation/Gait  Yes    Ambulation/Gait Assistance  5: Supervision    Ambulation Distance (Feet)  115 Feet   around gym between activities   Assistive device  None    Gait Pattern  Step-through pattern;Narrow base of support;Decreased weight shift to left;Decreased arm swing - right;Decreased arm swing - left    Ambulation Surface  Level;Indoor      Exercises   Exercises  Knee/Hip    Other Exercises   Completed seated hamstring stretch on LLE, 2 x 30 seconds. Cues for improved form ensuring upright posture to  faciliate stretch. Seated piriformis stretch 2 x 30 seconds on L.       Knee/Hip Exercises: Standing   Forward Step Up  3 sets;10 reps;Step Height: 4";Both   alternating step up; no UE support         Balance Exercises - 09/27/19 1336      Balance Exercises: Standing   Standing Eyes Opened  Head turns;Narrow base of support (BOS);Foam/compliant surface;3 reps;30 secs   horizontal/vertical head turns   Standing Eyes Closed  Narrow base of support (BOS);Foam/compliant surface;3 reps;30 secs;Head turns   horizontal/vertical turns   Tandem Gait  Forward;Intermittent upper extremity support;4 reps   in // on firm surface. Intermittent CGA   Other Standing Exercises Comments  Pt. increased difficulty with horizontal head turns, especially to the L. Completed forward marching, with backwards gait in // bars without UE support. Verbal cues for increased step length with backwards walking. Instances of instability noted, with intermittent CGA assist required for steadying.         PT Education - 09/27/19 1358    Education Details  Added Hamstring and Piriformis stretch to HEP to help patients report of tightness in L hip.    Person(s) Educated  Patient    Methods  Explanation    Comprehension  Verbalized understanding       PT Short Term Goals - 09/23/19 1515      PT SHORT TERM GOAL #1   Title  Pt will be independent with initial HEP in order to build upon functional gains made in PT. ALL STGS DUE 10/05/19    Time  2    Period  Weeks    Target Date  10/05/19      PT SHORT TERM GOAL #2   Title  Pt will undergo further assessment of 6MWT to determine endurance for walking - LTG to be written as appropriate.    Time  2    Status  Achieved        PT Long Term Goals - 09/23/19 1513      PT LONG TERM GOAL #1   Title  Pt will be independent with final HEP in order to build upon functional gains made in PT.  ALL LTGS DUE 10/19/19    Time  4    Period  Weeks    Status  New      PT  LONG TERM GOAL #2   Title  Pt will improve FGA to at least a 23/30 in order to demo decr  fall risk.    Baseline  17/30 on 09/20/19    Time  4    Period  Weeks    Status  New      PT LONG TERM GOAL #3   Title  Paitent will ambulate 1,500 ft in 6 minutes without an AD to improve endurance with walking.    Baseline  975 ft - 09/23/19    Time  4    Period  Weeks    Status  New      PT LONG TERM GOAL #4   Title  Pt will perform at least 10 sit <> stands in 30 seconds with no UE support from standard chair in order to demo improved functional LE strength.    Time  4    Period  Weeks    Status  New      PT LONG TERM GOAL #5   Title  Pt will improve gait speed with no AD to at least 2.5 ft/sec in order to demo improved gait speed and efficiency.    Baseline  2.24 ft/sec    Time  4    Period  Weeks    Status  New            Plan - 09/27/19 1407    Clinical Impression Statement  Continued balance training with today session, focused on incorporating dynamic gait activities. Patient demonstrates difficulty and unsteadiness with balanace activities when eyes closed and with head turns. Patietn reporting that she has had alot of inner ear issues within the past. Updated HEP to include stretching in hopes to relieve L hip tightness patient reports of. Pt will continue to benefit from skilled PT serivces to work toward short and long term goals.    Personal Factors and Comorbidities  Comorbidity 3+;Past/Current Experience    Comorbidities  anxiety, HTN, Chiari I malformation, a fib, CKD stage III, HLD    Examination-Activity Limitations  Stairs;Locomotion Level    Stability/Clinical Decision Making  Stable/Uncomplicated    Rehab Potential  Good    PT Frequency  2x / week    PT Duration  4 weeks    PT Treatment/Interventions  ADLs/Self Care Home Management;Therapeutic exercise;Therapeutic activities;Functional mobility training;Stair training;Gait training;DME Instruction;Balance  training;Neuromuscular re-education;Patient/family education    PT Next Visit Plan  Continue gait without AD, and gait with dynamic and dual task activities included. Also assess gait outdoors on uneven surfaces. Continue strenghtening of bilateral lower extremities.    PT Home Exercise Plan  [email or web address]       Patient will benefit from skilled therapeutic intervention in order to improve the following deficits and impairments:  Abnormal gait, Decreased balance, Decreased activity tolerance, Difficulty walking, Decreased strength, Decreased endurance  Visit Diagnosis: Muscle weakness (generalized)  Unsteadiness on feet  Other lack of coordination  Hemiplegia and hemiparesis following cerebral infarction affecting left non-dominant side (HCC)  Other abnormalities of gait and mobility     Problem List Patient Active Problem List   Diagnosis Date Noted  . Pruritus 09/01/2019  . Biventricular ICD (implantable cardioverter-defibrillator) in place   . Cardiac arrest with ventricular fibrillation ([place]) 08/24/2019  . TIA (transient ischemic attack) 08/15/2019  . Long term current use of anticoagulant 08/14/2019  . Cardiomyopathy ([place]) 08/14/2019  . Paroxysmal atrial fibrillation ([place]) 08/06/2019  . Statin intolerance 08/03/2019  . Dysphagia   . History of CVA (cerebrovascular accident) 08/02/2019  . Hypokalemia 08/02/2019  . Left bundle branch block 08/02/2019  . CKD (chronic kidney disease)  stage 3, GFR 30-59 ml/min 12/13/2018  . Primary localized osteoarthritis of right knee 02/17/2017  . Primary osteoarthritis of right knee 02/17/2017  . Preventative health care 09/01/2016  . Vitamin B12 deficiency 09/10/2015  . Insomnia 07/16/2015  . GERD (gastroesophageal reflux disease) 03/26/2015  . Fatigue 10/02/2014  . Other malaise and fatigue 02/08/2014  . HLD (hyperlipidemia) 11/19/2011  . Vaginal dryness, menopausal 11/19/2011  . Adjustment disorder 01/08/2011  . Former smoker  12/19/2008  . Essential hypertension 12/19/2008    Jones Bales, PT, DPT 09/27/2019, 2:13 PM  [facility] [address] [place], [place], [address] Phone: [phone]   Fax:  [phone]  Name: [name] MRN: [id number] Date of Birth: 05/31/1951

## 2019-09-28 ENCOUNTER — Encounter: Payer: Medicare HMO | Admitting: Speech Pathology

## 2019-09-28 ENCOUNTER — Encounter: Payer: Medicare HMO | Admitting: Occupational Therapy

## 2019-09-28 ENCOUNTER — Ambulatory Visit: Payer: Medicare HMO | Admitting: Physical Therapy

## 2019-09-28 DIAGNOSIS — I5023 Acute on chronic systolic (congestive) heart failure: Secondary | ICD-10-CM | POA: Diagnosis not present

## 2019-09-29 ENCOUNTER — Encounter: Payer: Self-pay | Admitting: Cardiology

## 2019-09-29 ENCOUNTER — Other Ambulatory Visit: Payer: Self-pay

## 2019-09-29 ENCOUNTER — Ambulatory Visit: Payer: Medicare HMO | Admitting: Cardiology

## 2019-09-29 VITALS — BP 132/57 | HR 62 | Temp 96.3°F | Resp 15 | Ht 64.0 in | Wt 148.0 lb

## 2019-09-29 DIAGNOSIS — Z7901 Long term (current) use of anticoagulants: Secondary | ICD-10-CM | POA: Diagnosis not present

## 2019-09-29 DIAGNOSIS — I447 Left bundle-branch block, unspecified: Secondary | ICD-10-CM

## 2019-09-29 DIAGNOSIS — I429 Cardiomyopathy, unspecified: Secondary | ICD-10-CM | POA: Diagnosis not present

## 2019-09-29 DIAGNOSIS — E782 Mixed hyperlipidemia: Secondary | ICD-10-CM | POA: Diagnosis not present

## 2019-09-29 DIAGNOSIS — I480 Paroxysmal atrial fibrillation: Secondary | ICD-10-CM

## 2019-09-29 DIAGNOSIS — Z95810 Presence of automatic (implantable) cardiac defibrillator: Secondary | ICD-10-CM | POA: Diagnosis not present

## 2019-09-29 DIAGNOSIS — I10 Essential (primary) hypertension: Secondary | ICD-10-CM | POA: Diagnosis not present

## 2019-09-29 DIAGNOSIS — I5022 Chronic systolic (congestive) heart failure: Secondary | ICD-10-CM

## 2019-09-29 DIAGNOSIS — I469 Cardiac arrest, cause unspecified: Secondary | ICD-10-CM

## 2019-09-29 DIAGNOSIS — Z8673 Personal history of transient ischemic attack (TIA), and cerebral infarction without residual deficits: Secondary | ICD-10-CM

## 2019-09-29 DIAGNOSIS — Z87891 Personal history of nicotine dependence: Secondary | ICD-10-CM

## 2019-09-29 LAB — MAGNESIUM: Magnesium: 1.8 mg/dL (ref 1.6–2.3)

## 2019-09-29 LAB — BASIC METABOLIC PANEL
BUN/Creatinine Ratio: 11 — ABNORMAL LOW (ref 12–28)
BUN: 20 mg/dL (ref 8–27)
CO2: 20 mmol/L (ref 20–29)
Calcium: 9.1 mg/dL (ref 8.7–10.3)
Chloride: 104 mmol/L (ref 96–106)
Creatinine, Ser: 1.87 mg/dL — ABNORMAL HIGH (ref 0.57–1.00)
GFR calc Af Amer: 31 mL/min/{1.73_m2} — ABNORMAL LOW (ref >59)
GFR calc non Af Amer: 27 mL/min/{1.73_m2} — ABNORMAL LOW (ref >59)
Glucose: 105 mg/dL — ABNORMAL HIGH (ref 65–99)
Potassium: 4.7 mmol/L (ref 3.5–5.2)
Sodium: 139 mmol/L (ref 134–144)

## 2019-09-29 LAB — PRO B NATRIURETIC PEPTIDE: NT-Pro BNP: 2390 pg/mL — ABNORMAL HIGH (ref 0–301)

## 2019-09-29 MED ORDER — MAGNESIUM OXIDE -MG SUPPLEMENT 200 MG PO TABS: 1.0000 | ORAL_TABLET | Freq: Three times a day (TID) | ORAL | 0 refills | Status: DC

## 2019-09-29 MED ORDER — ENTRESTO 24-26 MG PO TABS: 1.0000 | ORAL_TABLET | Freq: Two times a day (BID) | ORAL | 0 refills | Status: DC

## 2019-09-29 NOTE — Progress Notes (Signed)
[name] Date of Birth: 01/30/1951 MRN: [id number] Primary Care Provider:Clark, Leticia Penna, NP Primary Cardiologist: Rex Kras, DO Electrophysiologist: Dr. Reggy Eye  Date: 09/29/19 Last Office Visit: September 02, 2019  Chief Complaint  Patient presents with  . Follow-up    4 week  . Congestive Heart Failure   HPI  [name] is a 69 y.o. female who presents to the office with a chief complaint of "  heart failure." Patient's past medical history and cardiac risk factors include: Hypertension, hyperlipidemia, former smoker, history of Arnold-Chiari malformation, history of VT/VF, cardiac arrest, nonischemic cardiomyopathy, peripheral vascular disease, paroxysmal atrial fibrillation, left bundle branch block, history of CVA.  Patient is accompanied by her daughter [name] at today's office visit.   Since last office visit patient states that she is doing well from a cardiovascular standpoint.  Patient's congestive heart failure is well managed on current medical therapy.  Patient denies any heart failure symptoms.  Patient states that her weight has remained stable approximately between 147-148 pounds.  No evidence of bleeding.  Most recent blood work reviewed with the patient and her daughter at today's office visit.  Patient has stopped smoking as of last office visit.  She is congratulated on the achievement that she has made.  Since last office visit her [name] has been reduced to 24/26 mg p.o. twice daily and she is asked to hold off potassium supplement.  The Entresto dose was reduced secondary to serum creatinine and GFR levels.  And potassium was held because her serum potassium was at the high limit of normal.  ALLERGIES: Allergies  Allergen Reactions  . Shellfish-Derived Products Anaphylaxis  . Ativan [Lorazepam] Other (See Comments)    Makes "skin crawl" and insomnia  . Buspar [Buspirone] Nausea Only    Sweating and dizzy  . Clarithromycin Swelling  .  Codeine Nausea And Vomiting  . Doxycycline Other (See Comments)    Unknown  . Guaifenesin Other (See Comments)    Palpitations  . Oxycodone Nausea And Vomiting  . Prednisone Nausea And Vomiting and Other (See Comments)    Makes my heart race  . Statins Other (See Comments)    Muscle pain  . Sulfonamide Derivatives Nausea And Vomiting    Achiness  . Tetracycline Nausea Only  . Vicodin [Hydrocodone-Acetaminophen] Nausea And Vomiting  . Famotidine Rash  . Latex Rash  . Omeprazole Nausea Only    Cough, shortness of breath - patient doesn't remember  . Peanut-Containing Drug Products Itching and Rash  . Protonix [Pantoprazole] Rash  . Wellbutrin [Bupropion] Palpitations     MEDICATION LIST PRIOR TO VISIT: Current Outpatient Medications on File Prior to Visit  Medication Sig Dispense Refill  . acetaminophen (TYLENOL) 500 MG tablet Take 1,000 mg by mouth 2 (two) times daily as needed (pain).    [name] aspirin 81 MG chewable tablet Chew 1 tablet (81 mg total) by mouth daily. 30 tablet 0  . carvedilol (COREG) 6.25 MG tablet TAKE 1 TABLET (6.25 MG TOTAL) BY MOUTH 2 (TWO) TIMES DAILY WITH A MEAL. 180 tablet 0  . docusate sodium (COLACE) 100 MG capsule Take 1 capsule (100 mg total) by mouth daily as needed for mild constipation. 30 capsule 0  . ELIQUIS 5 MG TABS tablet TAKE 1 TABLET BY MOUTH TWICE A DAY 180 tablet 0  . ezetimibe (ZETIA) 10 MG tablet Take 1 tablet (10 mg total) by mouth daily. 30 tablet 0  . loratadine (CLARITIN) 10 MG tablet Take 10 mg  by mouth daily.    . rosuvastatin (CRESTOR) 10 MG tablet TAKE 1 TABLET (10 MG TOTAL) BY MOUTH DAILY AT 6 PM. 30 tablet 0   No current facility-administered medications on file prior to visit.    PAST MEDICAL HISTORY: Past Medical History:  Diagnosis Date  . Allergy   . Anxiety   . Arnold-Chiari malformation ([place])   . Arthritis   . CHF (congestive heart failure) ([place])   . Coronary artery disease   . GERD (gastroesophageal reflux disease)    . H. pylori infection    3 years ago  . H/O cardiac arrest    Hx of VT/Vfib arrest  . Hyperlipidemia   . Incontinence   . Paroxysmal atrial fibrillation ([place]) 08/06/2019  . Syncope and collapse    Per pt, denies passing out  . Tobacco use disorder   . Unspecified essential hypertension     PAST SURGICAL HISTORY: Past Surgical History:  Procedure Laterality Date  . APPENDECTOMY    . ARNOLD CHIARI SURGERY     neurocranial surgery  . BIV ICD INSERTION CRT-D N/A 08/25/2019   Procedure: BIV ICD INSERTION CRT-D;  Surgeon: Constance Haw, MD;  Location: [facility];  Service: Cardiovascular;  Laterality: N/A;  . BLEPHAROPLASTY     Bil  . C-EYE SURGERY PROCEDURE    . CARDIAC CATHETERIZATION    . CHOLECYSTECTOMY    . EYE SURGERY    . heart failure    . LEFT HEART CATH AND CORONARY ANGIOGRAPHY N/A 08/04/2019   Procedure: LEFT HEART CATH AND CORONARY ANGIOGRAPHY;  Surgeon: Adrian Prows, MD;  Location: [facility];  Service: Cardiovascular;  Laterality: N/A;  . MASS EXCISION Right 12/27/2013   Procedure: MINOR EXCISION OF RIGHT THUMB MUCOID CYST, DEBRIDEMENT OF INTERPHALANGEAL JOINT;  Surgeon: Cammie Sickle, MD;  Location: [facility];  Service: Orthopedics;  Laterality: Right;  . mass on thumb  right  . TOTAL KNEE ARTHROPLASTY Right 02/17/2017  . TOTAL KNEE ARTHROPLASTY Right 02/17/2017   Procedure: TOTAL KNEE ARTHROPLASTY;  Surgeon: Melrose Nakayama, MD;  Location: [facility];  Service: Orthopedics;  Laterality: Right;  . TUBAL LIGATION      FAMILY HISTORY: The patient family history includes Bone cancer in her mother; Cancer in her sister; Diabetes in her sister; Diverticulosis in her sister; Heart disease in her mother; Hypertension in her sister; Tuberculosis in her father.   SOCIAL HISTORY:  The patient  reports that she quit smoking about 7 weeks ago. Her smoking use included cigarettes. She has a 50.00 pack-year smoking history. She has never used  smokeless tobacco. She reports that she does not drink alcohol or use drugs.  Review of Systems  Constitution: Negative for chills and fever.  HENT: Negative for ear discharge, ear pain and nosebleeds.   Eyes: Negative for blurred vision and discharge.  Cardiovascular: Positive for dyspnea on exertion (chronic and stable). Negative for chest pain, claudication, leg swelling, near-syncope, orthopnea, palpitations, paroxysmal nocturnal dyspnea and syncope.  Respiratory: Negative for cough and shortness of breath.   Endocrine: Negative for polydipsia, polyphagia and polyuria.  Hematologic/Lymphatic: Negative for bleeding problem.  Skin: Negative for flushing and nail changes.  Musculoskeletal: Negative for muscle cramps, muscle weakness and myalgias.  Gastrointestinal: Negative for abdominal pain, dysphagia, hematemesis, hematochezia, melena, nausea and vomiting.  Neurological: Negative for dizziness, focal weakness and light-headedness.    PHYSICAL EXAM: Vitals with BMI 09/29/2019 09/27/2019 09/27/2019  Height 5\' 4"  - -  Weight 148 lbs - -  BMI 08.65 - -  Systolic 784 696 295  Diastolic 57 64 61  Pulse 62 71 71   CONSTITUTIONAL: Well-developed and well-nourished. No acute distress.  SKIN: Skin is warm and dry. No rash noted. No cyanosis. No pallor. No jaundice HEAD: Normocephalic and atraumatic.  EYES: No scleral icterus MOUTH/THROAT: Moist oral membranes.  NECK: No JVD present. No thyromegaly noted.   LYMPHATIC: No visible cervical adenopathy.  CHEST Normal respiratory effort. No intercostal retractions.  BiV ICD noted left infraclavicular region. LUNGS: Clear to auscultation bilaterally.  No stridor. No wheezes. No rales.  CARDIOVASCULAR: Regular, positive M8-U1, soft holosystolic murmur heard at the apex radiating to axilla, no gallops or rubs appreciated ABDOMINAL: Nonobese, soft, nontender, nondistended, positive bowel sounds in all 4 quadrants no apparent ascites.  EXTREMITIES:  No peripheral edema. 2+ right femoral pulse, 1+ left femoral pulse, none palpable bilateral popliteal, dorsalis pedis or posterior tibial pulses.  Warm to touch bilaterally.  No discoloration or cyanosis present. HEMATOLOGIC: No significant bruising NEUROLOGIC: Oriented to person, place, and time. Nonfocal. Normal muscle tone.  PSYCHIATRIC: Normal mood and affect. Normal behavior. Cooperative  CARDIAC DATABASE: EKG: 08/12/2019: Normal sinus rhythm with ventricular rate of 61 bpm, left axis deviation, left bundle branch block, nonspecific T wave abnormalities.  Prior EKG dated 08/04/2019 shows a normal sinus rhythm, left axis deviation, left bundle branch block  Echocardiogram: 08/03/2019: LVEF 25-30%, severely reduced left ventricular systolic function, global hypokinesis, moderate concentric left ventricular hypertrophy, mild MR, 08/04/2019: LVEF 25-30%, severely reduced LV function, global hypokinesis, paradoxical septal motion secondary to LBBB.   Heart Catheterization: 08/04/19:  LV: Global hypokinesis, upper limit of normal size, EF 25 to 30%. Left main: Normal. LAD: Mild diffuse disease.  Proximal LAD has a 30 to 40% stenosis, mid segment has a 20 to 30% stenosis, scattered disease noted in the LAD.  Brisk flow. Circumflex: Again scattered disease noted in the circumflex.  Mid segment has at most a 30 to 40% stenosis which appears to be eccentric and calcified. RCA: Dominant.  Mild diffuse disease again noted.  Mid segment after the origin of RV branch has a 70 to 80% stenosis.  Brisk flow is evident throughout the RCA. Impression: Findings consistent with nonischemic cardiomyopathy.  Although she has significant disease in the right coronary artery, this does not explain her presentation with global hypokinesis, neither does it explain VF arrest as the lesion does not appear to be unstable.   Carotid duplex: 08/03/2019: Right Carotid: Velocities in the right ICA are consistent with a  1-39% stenosis. Left Carotid: Velocities in the left ICA are consistent with a 1-39%  stenosis. Vertebrals: Bilateral vertebral arteries demonstrate antegrade flow. Subclavians: Normal flow hemodynamics were seen in bilateral subclavian arteries.  LABORATORY DATA: CBC Latest Ref Rng & Units 08/26/2019 08/25/2019 08/24/2019  WBC 4.0 - 10.5 K/uL 7.2 6.1 -  Hemoglobin 12.0 - 15.0 g/dL 13.1 12.7 10.5(L)  Hematocrit 36.0 - 46.0 % 39.7 38.9 31.0(L)  Platelets 150 - 400 K/uL 219 261 -    CMP Latest Ref Rng & Units 09/28/2019 09/21/2019 09/12/2019  Glucose 65 - 99 mg/dL 105(H) 96 92  BUN 8 - 27 mg/dL 20 20 17   Creatinine 0.57 - 1.00 mg/dL 1.87(H) 1.84(H) 2.00(H)  Sodium 134 - 144 mmol/L 139 140 137  Potassium 3.5 - 5.2 mmol/L 4.7 5.3(H) 5.3(H)  Chloride 96 - 106 mmol/L 104 105 103  CO2 20 - 29 mmol/L 20 20 21   Calcium  8.7 - 10.3 mg/dL 9.1 9.4 9.4  Total Protein 6.5 - 8.1 g/dL - - -  Total Bilirubin 0.3 - 1.2 mg/dL - - -  Alkaline Phos 38 - 126 U/L - - -  AST 15 - 41 U/L - - -  ALT 0 - 44 U/L - - -    Lipid Panel     Component Value Date/Time   CHOL 114 09/21/2019 1416   TRIG 147 09/21/2019 1416   HDL 33 (L) 09/21/2019 1416   CHOLHDL 7.5 08/03/2019 0249   VLDL 26 08/03/2019 0249   LDLCALC 55 09/21/2019 1416   LDLDIRECT 193.0 09/02/2016 0839   LABVLDL 26 09/21/2019 1416    Lab Results  Component Value Date   HGBA1C 5.7 (H) 08/03/2019   No components found for: NTPROBNP Lab Results  Component Value Date   TSH 0.550 08/24/2019   TSH 0.75 08/23/2019   TSH 0.66 09/16/2017    FINAL MEDICATION LIST END OF ENCOUNTER: Meds ordered this encounter  Medications  . ENTRESTO 24-26 MG    Sig: Take 1 tablet by mouth 2 (two) times daily.    Dispense:  180 tablet    Refill:  0  . Magnesium Oxide 200 MG TABS    Sig: Take 1 tablet (200 mg total) by mouth in the morning, at noon, and at bedtime.    Dispense:  270 tablet    Refill:  0    Medications Discontinued During This Encounter   Medication Reason  . nicotine (NICODERM CQ - DOSED IN MG/24 HOURS) 21 mg/24hr patch Patient Preference  . ENTRESTO 49-51 MG Change in therapy  . spironolactone (ALDACTONE) 25 MG tablet Change in therapy  . sacubitril-valsartan (ENTRESTO) 51-88 MG Duplicate     Current Outpatient Medications:  .  acetaminophen (TYLENOL) 500 MG tablet, Take 1,000 mg by mouth 2 (two) times daily as needed (pain)., Disp: , Rfl:  .  aspirin 81 MG chewable tablet, Chew 1 tablet (81 mg total) by mouth daily., Disp: 30 tablet, Rfl: 0 .  carvedilol (COREG) 6.25 MG tablet, TAKE 1 TABLET (6.25 MG TOTAL) BY MOUTH 2 (TWO) TIMES DAILY WITH A MEAL., Disp: 180 tablet, Rfl: 0 .  docusate sodium (COLACE) 100 MG capsule, Take 1 capsule (100 mg total) by mouth daily as needed for mild constipation., Disp: 30 capsule, Rfl: 0 .  ELIQUIS 5 MG TABS tablet, TAKE 1 TABLET BY MOUTH TWICE A DAY, Disp: 180 tablet, Rfl: 0 .  ezetimibe (ZETIA) 10 MG tablet, Take 1 tablet (10 mg total) by mouth daily., Disp: 30 tablet, Rfl: 0 .  loratadine (CLARITIN) 10 MG tablet, Take 10 mg by mouth daily., Disp: , Rfl:  .  rosuvastatin (CRESTOR) 10 MG tablet, TAKE 1 TABLET (10 MG TOTAL) BY MOUTH DAILY AT 6 PM., Disp: 30 tablet, Rfl: 0 .  ENTRESTO 24-26 MG, Take 1 tablet by mouth 2 (two) times daily., Disp: 180 tablet, Rfl: 0 .  Magnesium Oxide 200 MG TABS, Take 1 tablet (200 mg total) by mouth in the morning, at noon, and at bedtime., Disp: 270 tablet, Rfl: 0  IMPRESSION:    ICD-10-CM   1. Chronic HFrEF (heart failure with reduced ejection fraction) (HCC)  I50.22 ENTRESTO 24-26 MG    Magnesium Oxide 200 MG TABS    Basic metabolic panel    Magnesium    Pro b natriuretic peptide (BNP)[facility])    Pro b natriuretic peptide (BNP)[facility])    Magnesium  Basic metabolic panel  2. Mixed hyperlipidemia  E78.2   3. Biventricular ICD (implantable cardioverter-defibrillator) in place  Z95.810   4. Cardiac  arrest with ventricular fibrillation (HCC)  I46.9    I49.01   5. Cardiomyopathy  I42.9   6. Left bundle branch block  I44.7   7. Paroxysmal atrial fibrillation (HCC)  I48.0   8. Long term current use of anticoagulant  Z79.01   9. Essential hypertension  I10   10. History of CVA (cerebrovascular accident)  Z61.73   11. Former smoker  Z87.891      RECOMMENDATIONS: KOLINA KUBE is a 69 y.o. female who presents to the office with a chief complaint of " management of congestive heart failure." Patient's past medical history and cardiac risk factors include: Hypertension, hyperlipidemia, tobacco use, history of Arnold-Chiari malformation, history of VT/VF, cardiac arrest, nonischemic cardiomyopathy, peripheral vascular disease, paroxysmal atrial fibrillation, left bundle branch block, history of CVA.  Acute on chronic heart failure with reduced ejection fraction, stage C, NYHA class II/III: Improving  Continue guideline directed medical therapy.  Patient has lost a few pounds since last office visit.  At home her weight has remained stable between 147-148 pounds.  Continue Coreg.  Entresto was reduced to 24/26 mg p.o. twice daily in the interim given her kidney function and potassium levels.  For now we will continue with Entresto 24/26 mg p.o. twice daily.  Prescription provided  Hold Spironolactone 12.5 mg p.o. daily.  Start magnesium oxide  Patient's serum creatinine level continues to rise compared to her baseline of 1.1 mg/dL.  She needs close monitoring of her kidney function and electrolytes.  May even consider holding Entresto after the next blood work and transition her to hydralazine and Isordil combination.  Most recent labs reviewed.  Repeat blood work in 1 week.  Strict I's and O's, and daily weights recommended.  Fluid restriction to less than 2 L/day, sodium restriction to less than 1.5 g/day.  Cardiomyopathy, suggestive of nonischemic: See above  Status post BiV  ICD status post ventricular fibrillation arrest:  Medtronic model BTDH7CB Claria Quad CRT-D SureScan (serial Number [id number] )   Status post polymorphic VT/V. fib arrest: Status post BiV ICD implantation.  Continue up titration of guideline with medical therapy as hemodynamics and laboratory values allow.  Left bundle branch block: Continue to monitor.  Paroxysmal atrial fibrillation:  Currently normal sinus rhythm.  Rate control: Coreg.  Rhythm control: None.  Thromboembolic prophylaxis currently on Eliquis.  Patient does not endorse any evidence of bleeding.  CHA2DS2VASc score: 7. Annual stroke risk: 9.6% (congestive heart failure, hypertension, age, history of stroke, medical history of peripheral vascular disease, and gender)  Long-term oral anticoagulation use:  Indication paroxysmal atrial fibrillation.  Patient did not endorse any evidence of bleeding  Mixed hyperlipidemia: Currently not at goal.  Currently on Crestor and Zetia.  We will continue to monitor.  Patient does not endorse any evidence of myalgias.  Most recent lipid profile discussed with the patient and her daughter at today's visit.  History of CVA: Educated on the importance of secondary prevention.  Will defer further management to primary and neurology.  Former smoker: Since last office visit patient has quit smoking completely.  She is congratulated on her efforts and the progress that she has made.  She is reemphasized the importance of complete smoking cessation.  Orders Placed This Encounter  Procedures  . Basic metabolic panel  . Magnesium  . Pro b natriuretic peptide (BNP)[facility])   --Continue cardiac medications as reconciled in final medication list. --Return in about 6 weeks (around 11/10/2019).. Or sooner if needed. --Continue follow-up with your primary care physician regarding the management of your other chronic comorbid conditions.  Patient's questions and  concerns were addressed to her and her husband's satisfaction. She and her husband voices understanding of the instructions provided during this encounter.   This note was created using a voice recognition software as a result there may be grammatical errors inadvertently enclosed that do not reflect the nature of this encounter. Every attempt is made to correct such errors.  Rex Kras, [place], [facility]  Pager: [phone] Office: [phone]

## 2019-09-29 NOTE — Patient Instructions (Signed)
Please remember to bring in your medication bottles in at the next visit.   New Medications that were added at today's visit:  Magnesium oxide Refilled Entresto  Medications that were discontinued at today's visit: Spironolactone  Please get labs done in 1 week at the nearest [place] follow up with your PCP as scheduled.

## 2019-09-30 ENCOUNTER — Other Ambulatory Visit: Payer: Self-pay

## 2019-09-30 ENCOUNTER — Ambulatory Visit: Payer: Medicare HMO

## 2019-09-30 VITALS — BP 114/62

## 2019-09-30 DIAGNOSIS — M6281 Muscle weakness (generalized): Secondary | ICD-10-CM | POA: Diagnosis not present

## 2019-09-30 DIAGNOSIS — R2681 Unsteadiness on feet: Secondary | ICD-10-CM

## 2019-09-30 DIAGNOSIS — R278 Other lack of coordination: Secondary | ICD-10-CM | POA: Diagnosis not present

## 2019-09-30 DIAGNOSIS — I69354 Hemiplegia and hemiparesis following cerebral infarction affecting left non-dominant side: Secondary | ICD-10-CM

## 2019-09-30 DIAGNOSIS — R2689 Other abnormalities of gait and mobility: Secondary | ICD-10-CM | POA: Diagnosis not present

## 2019-09-30 NOTE — Therapy (Signed)
[facility] [address] [place], [place], [address] Phone: [phone]   Fax:  [phone]  Physical Therapy Treatment  Patient Details  Name: [name] MRN: [id number] Date of Birth: 1951/01/16 Referring Provider (PT): Rex Kras, DO   Encounter Date: 09/30/2019  PT End of Session - 09/30/19 1530    Visit Number  4    Number of Visits  9    Date for PT Re-Evaluation  11/20/19    Authorization Type  [employer] Medicare    PT Start Time  6812    PT Stop Time  1528    PT Time Calculation (min)  43 min    Equipment Utilized During Treatment  Gait belt    Activity Tolerance  Patient tolerated treatment well    Behavior During Therapy  [facility] for tasks assessed/performed       Past Medical History:  Diagnosis Date  . Allergy   . Anxiety   . Arnold-Chiari malformation ([place])   . Arthritis   . CHF (congestive heart failure) ([place])   . Coronary artery disease   . GERD (gastroesophageal reflux disease)   . H. pylori infection    3 years ago  . H/O cardiac arrest    Hx of VT/Vfib arrest  . Hyperlipidemia   . Incontinence   . Paroxysmal atrial fibrillation ([place]) 08/06/2019  . Syncope and collapse    Per pt, denies passing out  . Tobacco use disorder   . Unspecified essential hypertension     Past Surgical History:  Procedure Laterality Date  . APPENDECTOMY    . ARNOLD CHIARI SURGERY     neurocranial surgery  . BIV ICD INSERTION CRT-D N/A 08/25/2019   Procedure: BIV ICD INSERTION CRT-D;  Surgeon: Constance Haw, MD;  Location: [facility];  Service: Cardiovascular;  Laterality: N/A;  . BLEPHAROPLASTY     Bil  . C-EYE SURGERY PROCEDURE    . CARDIAC CATHETERIZATION    . CHOLECYSTECTOMY    . EYE SURGERY    . heart failure    . LEFT HEART CATH AND CORONARY ANGIOGRAPHY N/A 08/04/2019   Procedure: LEFT HEART CATH AND CORONARY ANGIOGRAPHY;  Surgeon: Adrian Prows, MD;  Location: [facility];   Service: Cardiovascular;  Laterality: N/A;  . MASS EXCISION Right 12/27/2013   Procedure: MINOR EXCISION OF RIGHT THUMB MUCOID CYST, DEBRIDEMENT OF INTERPHALANGEAL JOINT;  Surgeon: Cammie Sickle, MD;  Location: [facility];  Service: Orthopedics;  Laterality: Right;  . mass on thumb  right  . TOTAL KNEE ARTHROPLASTY Right 02/17/2017  . TOTAL KNEE ARTHROPLASTY Right 02/17/2017   Procedure: TOTAL KNEE ARTHROPLASTY;  Surgeon: Melrose Nakayama, MD;  Location: [facility];  Service: Orthopedics;  Laterality: Right;  . TUBAL LIGATION      Vitals:   09/30/19 1508  BP: 114/62    Subjective Assessment - 09/30/19 1447    Subjective  Pt reports that she had an emotional day yesterday which affected her sleep yesterday. Had a doctors visit in which she had alot of questions for doctor. Still reports that she is having a sinus headache. Reports that she is currently taking some claritin which is helping the sinuses. No other new complaints. No new falls.    Pertinent History  PMH includes anxiety, HTN, Chiari I malformation, a fib, CKD stage III, HLD, hx of R knee replacement, hx of sciatica for several years    How long can you stand  comfortably?  approx. 2 hours of doing chores etc. before needing to lie down.    Patient Stated Goals  she wants to get stronger    Currently in Pain?  No/denies                       [facility] Adult PT Treatment/Exercise - 09/30/19 1503      Ambulation/Gait   Ambulation/Gait  Yes    Ambulation/Gait Assistance  5: Supervision;4: Min guard    Ambulation/Gait Assistance Details  gait over uneven surfaces, pt demonstrating difficulty and high reliance on vision with ambulation over grass. Also reports increased lower extremity fatigue with gait activities.     Ambulation Distance (Feet)  700 Feet    Assistive device  None    Gait Pattern  Step-through pattern;Narrow base of support;Decreased weight shift to left;Decreased arm swing - right;Decreased  arm swing - left    Ambulation Surface  Unlevel;Outdoor;Paved;Grass      Neuro Re-ed    Neuro Re-ed Details   Completed balance activities in // bars: standing on rocker board (ant/post) focusing on keeping it steady x 1 min w/o UE support. Progressed to holding rocker board steady with eyes closed 3 x 30 seconds each, increased sway noted with eye closure. Intermittent CGA and UE assist from // bars needed. Completed standing on foam with alternating toe taps to cone, initially started with single UE use, progressed to w/o UE support 2 x 10 reps. Completed dynamic gait activities in hallway, including forward marching 4 x 20', horizontal head turns 4 x 20' with increased sway noted and with one instance of LOB, vertical head turns 4 x 20'.  CGA assist for steadying as needed for all dynamic gait activities.                PT Short Term Goals - 09/23/19 1515      PT SHORT TERM GOAL #1   Title  Pt will be independent with initial HEP in order to build upon functional gains made in PT. ALL STGS DUE 10/05/19    Time  2    Period  Weeks    Target Date  10/05/19      PT SHORT TERM GOAL #2   Title  Pt will undergo further assessment of 6MWT to determine endurance for walking - LTG to be written as appropriate.    Time  2    Status  Achieved        PT Long Term Goals - 09/23/19 1513      PT LONG TERM GOAL #1   Title  Pt will be independent with final HEP in order to build upon functional gains made in PT.  ALL LTGS DUE 10/19/19    Time  4    Period  Weeks    Status  New      PT LONG TERM GOAL #2   Title  Pt will improve FGA to at least a 23/30 in order to demo decr fall risk.    Baseline  17/30 on 09/20/19    Time  4    Period  Weeks    Status  New      PT LONG TERM GOAL #3   Title  Paitent will ambulate 1,500 ft in 6 minutes without an AD to improve endurance with walking.    Baseline  975 ft - 09/23/19    Time  4    Period  Weeks  Status  New      PT LONG TERM GOAL #4    Title  Pt will perform at least 10 sit <> stands in 30 seconds with no UE support from standard chair in order to demo improved functional LE strength.    Time  4    Period  Weeks    Status  New      PT LONG TERM GOAL #5   Title  Pt will improve gait speed with no AD to at least 2.5 ft/sec in order to demo improved gait speed and efficiency.    Baseline  2.24 ft/sec    Time  4    Period  Weeks    Status  New              Patient will benefit from skilled therapeutic intervention in order to improve the following deficits and impairments:     Visit Diagnosis: Muscle weakness (generalized)  Unsteadiness on feet  Other lack of coordination  Hemiplegia and hemiparesis following cerebral infarction affecting left non-dominant side [facility])     Problem List Patient Active Problem List   Diagnosis Date Noted  . Pruritus 09/01/2019  . Biventricular ICD (implantable cardioverter-defibrillator) in place   . Cardiac arrest with ventricular fibrillation ([place]) 08/24/2019  . TIA (transient ischemic attack) 08/15/2019  . Long term current use of anticoagulant 08/14/2019  . Cardiomyopathy ([place]) 08/14/2019  . Paroxysmal atrial fibrillation ([place]) 08/06/2019  . Statin intolerance 08/03/2019  . Dysphagia   . History of CVA (cerebrovascular accident) 08/02/2019  . Hypokalemia 08/02/2019  . Left bundle branch block 08/02/2019  . CKD (chronic kidney disease) stage 3, GFR 30-59 ml/min 12/13/2018  . Primary localized osteoarthritis of right knee 02/17/2017  . Primary osteoarthritis of right knee 02/17/2017  . Preventative health care 09/01/2016  . Vitamin B12 deficiency 09/10/2015  . Insomnia 07/16/2015  . GERD (gastroesophageal reflux disease) 03/26/2015  . Fatigue 10/02/2014  . Other malaise and fatigue 02/08/2014  . HLD (hyperlipidemia) 11/19/2011  . Vaginal dryness, menopausal 11/19/2011  . Adjustment disorder 01/08/2011  . Former smoker 12/19/2008  . Essential hypertension  12/19/2008    Jones Bales, PT, DPT 09/30/2019, 3:39 PM  [facility] [address] [place], [place], [address] Phone: [phone]   Fax:  [phone]  Name: [name] MRN: [id number] Date of Birth: July 22, 1950

## 2019-10-01 ENCOUNTER — Other Ambulatory Visit: Payer: Self-pay | Admitting: Cardiology

## 2019-10-03 ENCOUNTER — Ambulatory Visit: Payer: Medicare HMO | Admitting: Physical Therapy

## 2019-10-03 ENCOUNTER — Encounter: Payer: Medicare HMO | Admitting: Speech Pathology

## 2019-10-03 ENCOUNTER — Encounter: Payer: Medicare HMO | Admitting: Occupational Therapy

## 2019-10-04 ENCOUNTER — Telehealth: Payer: Self-pay

## 2019-10-04 ENCOUNTER — Other Ambulatory Visit: Payer: Self-pay

## 2019-10-04 ENCOUNTER — Ambulatory Visit: Payer: Medicare HMO

## 2019-10-04 VITALS — BP 141/73

## 2019-10-04 DIAGNOSIS — M6281 Muscle weakness (generalized): Secondary | ICD-10-CM

## 2019-10-04 DIAGNOSIS — R2689 Other abnormalities of gait and mobility: Secondary | ICD-10-CM

## 2019-10-04 DIAGNOSIS — R2681 Unsteadiness on feet: Secondary | ICD-10-CM

## 2019-10-04 DIAGNOSIS — I69354 Hemiplegia and hemiparesis following cerebral infarction affecting left non-dominant side: Secondary | ICD-10-CM

## 2019-10-04 DIAGNOSIS — R278 Other lack of coordination: Secondary | ICD-10-CM | POA: Diagnosis not present

## 2019-10-04 NOTE — Telephone Encounter (Signed)
Patient's daughter called and stated that during the patient last visit the MagOx was changed to 200 mg (600mg ) 3x day, however, the pharmacy gave her 400 mg tablets. Daughter is asking can she just take 2 tablets (800mg ) daily. Please advise.

## 2019-10-04 NOTE — Telephone Encounter (Signed)
Patient daughter is aware

## 2019-10-04 NOTE — Telephone Encounter (Signed)
Yes

## 2019-10-04 NOTE — Patient Instructions (Signed)
Access Code: [email or web address] URL: [email or web address] Date: 10/04/2019 Prepared by: Baldomero Lamy  Exercises Sit to Stand - 1 x daily - 7 x weekly - 10 reps - 3 sets Supine Bridge - 1 x daily - 7 x weekly - 10 reps - 3 sets Standing Hip Abduction with Counter Support - 1 x daily - 7 x weekly - 10 reps - 3 sets Standing Hip Extension with Counter Support - 1 x daily - 7 x weekly - 10 reps - 3 sets Seated Hamstring Stretch - 1 x daily - 7 x weekly - 3 sets - 3 reps - 30 hold Seated Piriformis Stretch - 1 x daily - 7 x weekly - 3 sets - 3 reps - 30 hold Side Stepping with Resistance at Thighs and Counter Support - 1 x daily - 7 x weekly - 10 reps - 3 sets

## 2019-10-04 NOTE — Therapy (Signed)
[facility] [address] [place], [place], [address] Phone: [phone]   Fax:  [phone]  Physical Therapy Treatment  Patient Details  Name: [name] MRN: [id number] Date of Birth: [birth year] Referring Provider (PT): Rex Kras, DO   Encounter Date: 10/04/2019  PT End of Session - 10/04/19 1449    Visit Number  5    Number of Visits  9    Date for PT Re-Evaluation  11/20/19    Authorization Type  [employer]na Medicare    PT Start Time  1572    PT Stop Time  1528    PT Time Calculation (min)  45 min    Equipment Utilized During Treatment  Gait belt    Activity Tolerance  Patient tolerated treatment well    Behavior During Therapy  [facility] for tasks assessed/performed       Past Medical History:  Diagnosis Date  . Allergy   . Anxiety   . Arnold-Chiari malformation ([place])   . Arthritis   . CHF (congestive heart failure) ([place])   . Coronary artery disease   . GERD (gastroesophageal reflux disease)   . H. pylori infection    3 years ago  . H/O cardiac arrest    Hx of VT/Vfib arrest  . Hyperlipidemia   . Incontinence   . Paroxysmal atrial fibrillation ([place]) 08/06/2019  . Syncope and collapse    Per pt, denies passing out  . Tobacco use disorder   . Unspecified essential hypertension     Past Surgical History:  Procedure Laterality Date  . APPENDECTOMY    . ARNOLD CHIARI SURGERY     neurocranial surgery  . BIV ICD INSERTION CRT-D N/A 08/25/2019   Procedure: BIV ICD INSERTION CRT-D;  Surgeon: Constance Haw, MD;  Location: [facility];  Service: Cardiovascular;  Laterality: N/A;  . BLEPHAROPLASTY     Bil  . C-EYE SURGERY PROCEDURE    . CARDIAC CATHETERIZATION    . CHOLECYSTECTOMY    . EYE SURGERY    . heart failure    . LEFT HEART CATH AND CORONARY ANGIOGRAPHY N/A 08/04/2019   Procedure: LEFT HEART CATH AND CORONARY ANGIOGRAPHY;  Surgeon: Adrian Prows, MD;  Location: [facility];   Service: Cardiovascular;  Laterality: N/A;  . MASS EXCISION Right 12/27/2013   Procedure: MINOR EXCISION OF RIGHT THUMB MUCOID CYST, DEBRIDEMENT OF INTERPHALANGEAL JOINT;  Surgeon: Cammie Sickle, MD;  Location: [facility];  Service: Orthopedics;  Laterality: Right;  . mass on thumb  right  . TOTAL KNEE ARTHROPLASTY Right 02/17/2017  . TOTAL KNEE ARTHROPLASTY Right 02/17/2017   Procedure: TOTAL KNEE ARTHROPLASTY;  Surgeon: Melrose Nakayama, MD;  Location: [facility];  Service: Orthopedics;  Laterality: Right;  . TUBAL LIGATION      Vitals:   10/04/19 1620  BP: (!) 141/73    Subjective Assessment - 10/04/19 1446    Subjective  Patient reporting that she is having more pain in R knee today, which she believes is due to walking more than she is used too. No new complaints. No falls since last visit.    Pertinent History  PMH includes anxiety, HTN, Chiari I malformation, a fib, CKD stage III, HLD, hx of R knee replacement, hx of sciatica for several years    How long can you stand comfortably?  approx. 2 hours of doing chores etc. before needing to lie down.    Patient Stated Goals  she  wants to get stronger    Currently in Pain?  Yes    Pain Score  4     Pain Location  Knee    Pain Orientation  Right    Pain Descriptors / Indicators  Sore;Sharp    Pain Type  Acute pain    Pain Onset  In the past 7 days    Pain Frequency  Intermittent    Pain Relieving Factors  rest                       [facility] Adult PT Treatment/Exercise - 10/04/19 1505      Ambulation/Gait   Ambulation/Gait  Yes    Ambulation/Gait Assistance  5: Supervision    Ambulation Distance (Feet)  1060 Feet    Assistive device  None    Gait Pattern  Step-through pattern;Narrow base of support;Decreased weight shift to left;Decreased arm swing - right;Decreased arm swing - left    Ambulation Surface  Level;Indoor    Gait Comments  focus with gait training on improved gait distance, verbal cues  focused on improved gait speed. Pt demo instances of slowing down, with frequent verbal cues for improved pace.       Neuro Re-ed    Neuro Re-ed Details   Completed side stepping at countertop with red theraband placed at thighs, 10" x 5. Verbal cues to ensure lower extremity control.       Exercises   Exercises  Other Exercises    Other Exercises   Completed endurance training on SciFit (level 1.0) with BUE/BLE. Completed interval training completing 3 mins at 60 RPM, rest 1 minute, then completed 3 mins again at 65 RPM. Verbal cues to keep at steady rate. Reviewed HEP with patient to check progress.              PT Education - 10/04/19 1530    Education Details  Updated HEP (side stepping with red therband)    Person(s) Educated  Patient    Methods  Explanation;Demonstration;Handout    Comprehension  Verbalized understanding       PT Short Term Goals - 10/04/19 1450      PT SHORT TERM GOAL #1   Title  Pt will be independent with initial HEP in order to build upon functional gains made in PT. ALL STGS DUE 10/05/19    Baseline  Continue to add to HEP    Time  2    Period  Weeks    Status  On-going    Target Date  10/05/19      PT SHORT TERM GOAL #2   Title  Pt will undergo further assessment of 6MWT to determine endurance for walking - LTG to be written as appropriate.    Time  2    Status  Achieved        PT Long Term Goals - 09/23/19 1513      PT LONG TERM GOAL #1   Title  Pt will be independent with final HEP in order to build upon functional gains made in PT.  ALL LTGS DUE 10/19/19    Time  4    Period  Weeks    Status  New      PT LONG TERM GOAL #2   Title  Pt will improve FGA to at least a 23/30 in order to demo decr fall risk.    Baseline  17/30 on 09/20/19    Time  4  Period  Weeks    Status  New      PT LONG TERM GOAL #3   Title  Paitent will ambulate 1,500 ft in 6 minutes without an AD to improve endurance with walking.    Baseline  975 ft - 09/23/19     Time  4    Period  Weeks    Status  New      PT LONG TERM GOAL #4   Title  Pt will perform at least 10 sit <> stands in 30 seconds with no UE support from standard chair in order to demo improved functional LE strength.    Time  4    Period  Weeks    Status  New      PT LONG TERM GOAL #5   Title  Pt will improve gait speed with no AD to at least 2.5 ft/sec in order to demo improved gait speed and efficiency.    Baseline  2.24 ft/sec    Time  4    Period  Weeks    Status  New            Plan - 10/04/19 1621    Clinical Impression Statement  Today's skilled session focused on improving endurance. Completed gait with focus on improving gait speed and distance ambulated prior to rest break. PT educated patient on importance of improved gait speed for community mobility and reducing risk of falls. Also completed interval training with SciFit for improved endurance. Updated HEP with progression of side stepping due to reports/demonstration of improvement with HEP. Pt will continue to benefit from skilled PT services to address balance, strength, and endurance to improve overall functional mobility.    Personal Factors and Comorbidities  Comorbidity 3+;Past/Current Experience    Comorbidities  anxiety, HTN, Chiari I malformation, a fib, CKD stage III, HLD    Examination-Activity Limitations  Stairs;Locomotion Level    Stability/Clinical Decision Making  Stable/Uncomplicated    Rehab Potential  Good    PT Frequency  2x / week    PT Duration  4 weeks    PT Treatment/Interventions  ADLs/Self Care Home Management;Therapeutic exercise;Therapeutic activities;Functional mobility training;Stair training;Gait training;DME Instruction;Balance training;Neuromuscular re-education;Patient/family education    PT Next Visit Plan  Gait (focused on improved distance and speed) , dynamic gait and dual task activities included. Continue strenghtening of bilateral lower extremities.    PT Home Exercise  Plan  [email or web address]    Consulted and Agree with Plan of Care  Patient       Patient will benefit from skilled therapeutic intervention in order to improve the following deficits and impairments:  Abnormal gait, Decreased balance, Decreased activity tolerance, Difficulty walking, Decreased strength, Decreased endurance  Visit Diagnosis: Muscle weakness (generalized)  Unsteadiness on feet  Hemiplegia and hemiparesis following cerebral infarction affecting left non-dominant side (HCC)  Other abnormalities of gait and mobility     Problem List Patient Active Problem List   Diagnosis Date Noted  . Pruritus 09/01/2019  . Biventricular ICD (implantable cardioverter-defibrillator) in place   . Cardiac arrest with ventricular fibrillation ([place]) 08/24/2019  . TIA (transient ischemic attack) 08/15/2019  . Long term current use of anticoagulant 08/14/2019  . Cardiomyopathy ([place]) 08/14/2019  . Paroxysmal atrial fibrillation ([place]) 08/06/2019  . Statin intolerance 08/03/2019  . Dysphagia   . History of CVA (cerebrovascular accident) 08/02/2019  . Hypokalemia 08/02/2019  . Left bundle branch block 08/02/2019  . CKD (chronic kidney disease) stage 3, GFR 30-59 ml/min 12/13/2018  .  Primary localized osteoarthritis of right knee 02/17/2017  . Primary osteoarthritis of right knee 02/17/2017  . Preventative health care 09/01/2016  . Vitamin B12 deficiency 09/10/2015  . Insomnia 07/16/2015  . GERD (gastroesophageal reflux disease) 03/26/2015  . Fatigue 10/02/2014  . Other malaise and fatigue 02/08/2014  . HLD (hyperlipidemia) 11/19/2011  . Vaginal dryness, menopausal 11/19/2011  . Adjustment disorder 01/08/2011  . Former smoker 12/19/2008  . Essential hypertension 12/19/2008    [name], PT, DPT 10/04/2019, 4:31 PM  [facility] [address] [place], [place], [address] Phone: [phone]   Fax:  [phone]  Name: GABRIELLIA REMPEL MRN: [id number] Date of Birth: [birth year]

## 2019-10-06 ENCOUNTER — Ambulatory Visit: Payer: Medicare HMO

## 2019-10-06 DIAGNOSIS — I5022 Chronic systolic (congestive) heart failure: Secondary | ICD-10-CM | POA: Diagnosis not present

## 2019-10-07 LAB — BASIC METABOLIC PANEL
BUN/Creatinine Ratio: 8 — ABNORMAL LOW (ref 12–28)
BUN: 14 mg/dL (ref 8–27)
CO2: 23 mmol/L (ref 20–29)
Calcium: 8.7 mg/dL (ref 8.7–10.3)
Chloride: 107 mmol/L — ABNORMAL HIGH (ref 96–106)
Creatinine, Ser: 1.69 mg/dL — ABNORMAL HIGH (ref 0.57–1.00)
GFR calc Af Amer: 35 mL/min/{1.73_m2} — ABNORMAL LOW (ref >59)
GFR calc non Af Amer: 31 mL/min/{1.73_m2} — ABNORMAL LOW (ref >59)
Glucose: 133 mg/dL — ABNORMAL HIGH (ref 65–99)
Potassium: 4.6 mmol/L (ref 3.5–5.2)
Sodium: 143 mmol/L (ref 134–144)

## 2019-10-07 LAB — MAGNESIUM: Magnesium: 2.3 mg/dL (ref 1.6–2.3)

## 2019-10-07 LAB — PRO B NATRIURETIC PEPTIDE: NT-Pro BNP: 2661 pg/mL — ABNORMAL HIGH (ref 0–301)

## 2019-10-11 ENCOUNTER — Telehealth: Payer: Self-pay

## 2019-10-11 ENCOUNTER — Ambulatory Visit: Payer: Medicare HMO

## 2019-10-11 NOTE — Telephone Encounter (Signed)
Pt daughter called due to magnesium results being high and would like to know should she continue taking her magnesium medication Please advise

## 2019-10-12 ENCOUNTER — Ambulatory Visit: Payer: Medicare HMO | Admitting: Physical Therapy

## 2019-10-12 ENCOUNTER — Encounter: Payer: Medicare HMO | Admitting: Speech Pathology

## 2019-10-12 ENCOUNTER — Encounter: Payer: Medicare HMO | Admitting: Occupational Therapy

## 2019-10-12 ENCOUNTER — Other Ambulatory Visit: Payer: Self-pay | Admitting: Cardiology

## 2019-10-12 DIAGNOSIS — I5022 Chronic systolic (congestive) heart failure: Secondary | ICD-10-CM

## 2019-10-12 DIAGNOSIS — I129 Hypertensive chronic kidney disease with stage 1 through stage 4 chronic kidney disease, or unspecified chronic kidney disease: Secondary | ICD-10-CM

## 2019-10-12 MED ORDER — HYDRALAZINE HCL 10 MG PO TABS: 10.0000 mg | ORAL_TABLET | Freq: Three times a day (TID) | ORAL | 0 refills | Status: DC

## 2019-10-12 MED ORDER — ISOSORBIDE DINITRATE 10 MG PO TABS: 10.0000 mg | ORAL_TABLET | Freq: Three times a day (TID) | ORAL | 0 refills | Status: DC

## 2019-10-12 NOTE — Telephone Encounter (Signed)
Spoke to daughter; see EMR note.

## 2019-10-12 NOTE — Telephone Encounter (Signed)
See telephone note. Daughter is calling back [phone]. Please advise

## 2019-10-12 NOTE — Progress Notes (Signed)
Called the patient's daughter to review the most recent blood work.  At last office visit we held the spironolactone and decrease the Entresto to 24/26 mg p.o. twice daily secondary to kidney function.  Her creatinine has improved on most recent blood work, but not back to baseline.  I did explain to the patient's daughter that her kidney function may have been somewhat compromised due to her recent heart attacks.  We will discontinue Entresto for now in the hopes of improving her kidney function.  And will consider re-trial in near future.   We will start hydralazine 10 mg p.o. 3 times daily and Isordil 10 mg p.o. 3 times daily as an alternative to ACE inhibitor's/ARB/Arni's given her kidney function.  I have asked her daughter to discuss possibly having a nephrology consult at her next primary care visit.   Would like to repeat blood work in 1 week to evaluate electrolytes kidney function and BNP.  Daily weights recommended.  Low-salt diet recommended.  Patient's daughter's questions and concerns were addressed to her satisfaction.  Rex Kras, [place], [facility]  Pager: [phone] Office: [phone]

## 2019-10-13 ENCOUNTER — Ambulatory Visit: Payer: Medicare HMO

## 2019-10-13 ENCOUNTER — Telehealth: Payer: Self-pay

## 2019-10-13 NOTE — Telephone Encounter (Signed)
Noted, will review notes. 

## 2019-10-13 NOTE — Telephone Encounter (Signed)
Patient contacted the office stating that her cardiologist Dr. Terri Skains advised that patient have a follow up with Anda Kraft to check labs, and just to follow up on her current health. Patient could not recall what labs Dr. Terri Skains wanted Anda Kraft to check, but she is asking that Anda Kraft try to collaborate with Dr. Terri Skains and figure out what all he is needing? F/u appt scheduled for Tues 10/18/19. Anda Kraft, please advise.

## 2019-10-14 ENCOUNTER — Other Ambulatory Visit: Payer: Self-pay

## 2019-10-14 DIAGNOSIS — I129 Hypertensive chronic kidney disease with stage 1 through stage 4 chronic kidney disease, or unspecified chronic kidney disease: Secondary | ICD-10-CM

## 2019-10-14 DIAGNOSIS — I5022 Chronic systolic (congestive) heart failure: Secondary | ICD-10-CM

## 2019-10-14 NOTE — Therapy (Signed)
[facility] [address] [place], [place], [address] Phone: [phone]   Fax:  [phone]  Patient Details  Name: [name] MRN: [id number] Date of Birth: 1950/10/23 Referring Provider:  No ref. provider found  Encounter Date: 10/14/2019  PHYSICAL THERAPY DISCHARGE SUMMARY  Visits from Start of Care: 5  Current functional level related to goals / functional outcomes: Patient called [facility] facility with reports of increasing knee pain, and verbalizing want to cancel all therapy sessions due to fear she may injure her knees further. Patient cancelled all further PT session upon request, and would like to be discharged upon encouragement to attend from PT at last session.    Remaining deficits: Patient continues to demonstrate decreased endurance, decreased balance, decreased strength, and increased risk for falls at this time.   Education / Equipment: Educated on [employer] and walking program.  Plan: Patient agrees to discharge.  Patient goals were not met. Patient is being discharged due to the patient's request.  ??Patient cancelled all further PT sessions upon request due to fear of injury to bilateral knees. ??    PT Short Term Goals - 10/04/19 1450      PT SHORT TERM GOAL #1   Title  Pt will be independent with initial HEP in order to build upon functional gains made in PT. ALL STGS DUE 10/05/19    Baseline  Continue to add to HEP    Time  2    Period  Weeks    Status  On-going    Target Date  10/05/19      PT SHORT TERM GOAL #2   Title  Pt will undergo further assessment of 6MWT to determine endurance for walking - LTG to be written as appropriate.    Time  2    Status  Achieved      PT Long Term Goals - 09/23/19 1513      PT LONG TERM GOAL #1   Title  Pt will be independent with final HEP in order to build upon functional gains made in PT.  ALL LTGS DUE 10/19/19    Time  4    Period  Weeks    Status   New      PT LONG TERM GOAL #2   Title  Pt will improve FGA to at least a 23/30 in order to demo decr fall risk.    Baseline  17/30 on 09/20/19    Time  4    Period  Weeks    Status  New      PT LONG TERM GOAL #3   Title  Paitent will ambulate 1,500 ft in 6 minutes without an AD to improve endurance with walking.    Baseline  975 ft - 09/23/19    Time  4    Period  Weeks    Status  New      PT LONG TERM GOAL #4   Title  Pt will perform at least 10 sit <> stands in 30 seconds with no UE support from standard chair in order to demo improved functional LE strength.    Time  4    Period  Weeks    Status  New      PT LONG TERM GOAL #5   Title  Pt will improve gait speed with no AD to at least 2.5 ft/sec in order to demo improved gait speed and efficiency.    Baseline  2.24 ft/sec  Time  4    Period  Weeks    Status  New        Jones Bales, [place], DPT 10/14/2019, 2:57 PM  [facility] [address] [place], [place], [address] Phone: [phone]   Fax:  [phone]

## 2019-10-17 ENCOUNTER — Telehealth: Payer: Self-pay

## 2019-10-17 NOTE — Telephone Encounter (Signed)
Patient called and reports that she felt like she could have been shocked during the early morning hours of 10/17/19 from the device. Patient states she has had medication changes from Dr. Terri Skains, since has experienced nightmares and not sleeping well.  Patient denies any complaints other than fatigue. Transmission received and reviewed, no shocks or therapies noted. Patient advised to reach out to Cardiology regarding symptoms. States he has apt. With PCP tomorrow 10/18/19.

## 2019-10-17 NOTE — Telephone Encounter (Signed)
  Received Carelink alert 10/17/19 for increased Optivol and TI below baseline. Called patient to assess, patient denies any complaints. Patient states she was taken off diuretic to protect kidney's. Offered ICM clinic to patient, patient agreeable.

## 2019-10-17 NOTE — Telephone Encounter (Signed)
The pt states she felt like she been shocked twice. Pt denies feeling chest pains, dizziness, or SOB during the shock. The episodes happened when she was in a deep sleep. She states the doctor changed her medication and she has been feeling bad every since. I told her the nurse is going to review her transmission and give her a call back.

## 2019-10-18 ENCOUNTER — Other Ambulatory Visit: Payer: Self-pay

## 2019-10-18 ENCOUNTER — Ambulatory Visit (INDEPENDENT_AMBULATORY_CARE_PROVIDER_SITE_OTHER): Payer: Medicare HMO | Admitting: Primary Care

## 2019-10-18 ENCOUNTER — Ambulatory Visit: Payer: Medicare HMO

## 2019-10-18 VITALS — BP 124/84 | HR 90 | Temp 97.0°F | Ht 64.0 in | Wt 149.8 lb

## 2019-10-18 DIAGNOSIS — N1832 Chronic kidney disease, stage 3b: Secondary | ICD-10-CM

## 2019-10-18 DIAGNOSIS — I509 Heart failure, unspecified: Secondary | ICD-10-CM | POA: Diagnosis not present

## 2019-10-18 DIAGNOSIS — I10 Essential (primary) hypertension: Secondary | ICD-10-CM

## 2019-10-18 LAB — CBC
HCT: 34.7 % — ABNORMAL LOW (ref 36.0–46.0)
Hemoglobin: 11.5 g/dL — ABNORMAL LOW (ref 12.0–15.0)
MCHC: 33 g/dL (ref 30.0–36.0)
MCV: 88.4 fl (ref 78.0–100.0)
Platelets: 201 10*3/uL (ref 150.0–400.0)
RBC: 3.93 Mil/uL (ref 3.87–5.11)
RDW: 14.4 % (ref 11.5–15.5)
WBC: 7.8 10*3/uL (ref 4.0–10.5)

## 2019-10-18 LAB — MAGNESIUM: Magnesium: 2.5 mg/dL (ref 1.5–2.5)

## 2019-10-18 LAB — BASIC METABOLIC PANEL
BUN: 18 mg/dL (ref 6–23)
CO2: 27 mEq/L (ref 19–32)
Calcium: 9 mg/dL (ref 8.4–10.5)
Chloride: 104 mEq/L (ref 96–112)
Creatinine, Ser: 1.81 mg/dL — ABNORMAL HIGH (ref 0.40–1.20)
GFR: 27.7 mL/min — ABNORMAL LOW (ref >60.00)
Glucose, Bld: 103 mg/dL — ABNORMAL HIGH (ref 70–99)
Potassium: 4.5 mEq/L (ref 3.5–5.1)
Sodium: 136 mEq/L (ref 135–145)

## 2019-10-18 LAB — BRAIN NATRIURETIC PEPTIDE: Pro B Natriuretic peptide (BNP): 414 pg/mL — ABNORMAL HIGH (ref 0.0–100.0)

## 2019-10-18 NOTE — Assessment & Plan Note (Signed)
Appears euvolemic.  Compliant to hydralazine and isordil. Repeat BNP pending.

## 2019-10-18 NOTE — Progress Notes (Signed)
Subjective:    Patient ID: [name], female    DOB: 1950/12/09, 69 y.o.   MRN: [id number]  HPI  This visit occurred during the SARS-CoV-2 public health emergency.  Safety protocols were in place, including screening questions prior to the visit, additional usage of staff PPE, and extensive cleaning of exam room while observing appropriate contact time as indicated for disinfecting solutions.   Ms. All is a 69 year old female with a history of hypertension, paroxysmal atrial fibrillation, TIA, cardiomyopathy, CKD state III, tobacco abuse, cardiac arrest with ICD in place, hyperlipidemia, anxiety who presents today for follow up.  Currently following with cardiology, Dr. Terri Skains, and is here today for repeat BMP and BNP, and to discuss potential nephrology evaluation.   History of CKD that is suspected to be secondary to prior myocardial infarctions with cardiac arrest. [name] was discontinued on 10/12/19 in hopes of improving renal function. She was initiated on hydralazine 10 mg TID and isordil 10 mg TID for CHF and hypertension control. Creatinine of 1.69 and GFR of 31 on 10/06/19.  She is checking her blood pressure at home which is running "fine". She is checking her weight daily which fluctuates a pound or two. She denies chest pain.  She has been very tired since starting the new regimen of hydralazine and isordil.   BP Readings from Last 3 Encounters:  10/18/19 124/84  10/04/19 (!) 141/73  09/30/19 114/62   Wt Readings from Last 3 Encounters:  10/18/19 149 lb 12 oz (67.9 kg)  09/29/19 148 lb (67.1 kg)  09/15/19 151 lb (68.5 kg)     Review of Systems  Constitutional: Positive for fatigue.  Respiratory: Negative for shortness of breath.   Cardiovascular: Negative for chest pain.  Neurological: Negative for dizziness and headaches.       Past Medical History:  Diagnosis Date  . Allergy   . Anxiety   . Arnold-Chiari malformation ([place])   . Arthritis   . CHF  (congestive heart failure) ([place])   . Coronary artery disease   . GERD (gastroesophageal reflux disease)   . H. pylori infection    3 years ago  . H/O cardiac arrest    Hx of VT/Vfib arrest  . Hyperlipidemia   . Incontinence   . Paroxysmal atrial fibrillation ([place]) 08/06/2019  . Syncope and collapse    Per pt, denies passing out  . Tobacco use disorder   . Unspecified essential hypertension      Social History   Socioeconomic History  . Marital status: Married    Spouse name: Not on file  . Number of children: 2  . Years of education: Not on file  . Highest education level: Not on file  Occupational History  . Occupation: hair dresser  Tobacco Use  . Smoking status: Former Smoker    Packs/day: 1.00    Years: 50.00    Pack years: 50.00    Types: Cigarettes    Quit date: 08/08/2019    Years since quitting: 0.1  . Smokeless tobacco: Never Used  Substance and Sexual Activity  . Alcohol use: No    Alcohol/week: 0.0 standard drinks  . Drug use: No  . Sexual activity: Not on file  Other Topics Concern  . Not on file  Social History Narrative  . Not on file   Social Determinants of Health   Financial Resource Strain:   . Difficulty of Paying Living Expenses:   Food Insecurity:   . Worried About  Running Out of Food in the Last Year:   . [place] in the Last Year:   Transportation Needs:   . Lack of Transportation (Medical):   [name] Lack of Transportation (Non-Medical):   Physical Activity:   . Days of Exercise per Week:   . Minutes of Exercise per Session:   Stress:   . Feeling of Stress :   Social Connections:   . Frequency of Communication with Friends and Family:   . Frequency of Social Gatherings with Friends and Family:   . Attends Religious Services:   . Active Member of Clubs or Organizations:   . Attends Archivist Meetings:   [name] Marital Status:   Intimate Partner Violence:   . Fear of Current or Ex-Partner:   . Emotionally Abused:   [name]  Physically Abused:   . Sexually Abused:     Past Surgical History:  Procedure Laterality Date  . APPENDECTOMY    . ARNOLD CHIARI SURGERY     neurocranial surgery  . BIV ICD INSERTION CRT-D N/A 08/25/2019   Procedure: BIV ICD INSERTION CRT-D;  Surgeon: Constance Haw, MD;  Location: [facility];  Service: Cardiovascular;  Laterality: N/A;  . BLEPHAROPLASTY     Bil  . C-EYE SURGERY PROCEDURE    . CARDIAC CATHETERIZATION    . CHOLECYSTECTOMY    . EYE SURGERY    . heart failure    . LEFT HEART CATH AND CORONARY ANGIOGRAPHY N/A 08/04/2019   Procedure: LEFT HEART CATH AND CORONARY ANGIOGRAPHY;  Surgeon: Adrian Prows, MD;  Location: [facility];  Service: Cardiovascular;  Laterality: N/A;  . MASS EXCISION Right 12/27/2013   Procedure: MINOR EXCISION OF RIGHT THUMB MUCOID CYST, DEBRIDEMENT OF INTERPHALANGEAL JOINT;  Surgeon: Cammie Sickle, MD;  Location: [facility];  Service: Orthopedics;  Laterality: Right;  . mass on thumb  right  . TOTAL KNEE ARTHROPLASTY Right 02/17/2017  . TOTAL KNEE ARTHROPLASTY Right 02/17/2017   Procedure: TOTAL KNEE ARTHROPLASTY;  Surgeon: Melrose Nakayama, MD;  Location: [facility];  Service: Orthopedics;  Laterality: Right;  . TUBAL LIGATION      Family History  Problem Relation Age of Onset  . Bone cancer Mother   . Heart disease Mother   . Cancer Sister        metastatic; unknown primary  . Diverticulosis Sister   . Tuberculosis Father   . Diabetes Sister   . Hypertension Sister   . Colon cancer Neg Hx   . Esophageal cancer Neg Hx   . Pancreatic cancer Neg Hx   . Stomach cancer Neg Hx   . Liver disease Neg Hx   . Kidney disease Neg Hx   . Rectal cancer Neg Hx     Allergies  Allergen Reactions  . Shellfish-Derived Products Anaphylaxis  . Ativan [Lorazepam] Other (See Comments)    Makes "skin crawl" and insomnia  . Buspar [Buspirone] Nausea Only    Sweating and dizzy  . Clarithromycin Swelling  . Codeine Nausea And  Vomiting  . Doxycycline Other (See Comments)    Unknown  . Guaifenesin Other (See Comments)    Palpitations  . Oxycodone Nausea And Vomiting  . Prednisone Nausea And Vomiting and Other (See Comments)    Makes my heart race  . Statins Other (See Comments)    Muscle pain  . Sulfonamide Derivatives Nausea And Vomiting    Achiness  . Tetracycline Nausea Only  . Vicodin [Hydrocodone-Acetaminophen]  Nausea And Vomiting  . Famotidine Rash  . Latex Rash  . Omeprazole Nausea Only    Cough, shortness of breath - patient doesn't remember  . Peanut-Containing Drug Products Itching and Rash  . Protonix [Pantoprazole] Rash  . Wellbutrin [Bupropion] Palpitations    Current Outpatient Medications on File Prior to Visit  Medication Sig Dispense Refill  . acetaminophen (TYLENOL) 500 MG tablet Take 1,000 mg by mouth 2 (two) times daily as needed (pain).    [name] aspirin 81 MG chewable tablet Chew 1 tablet (81 mg total) by mouth daily. 30 tablet 0  . carvedilol (COREG) 6.25 MG tablet TAKE 1 TABLET (6.25 MG TOTAL) BY MOUTH 2 (TWO) TIMES DAILY WITH A MEAL. 180 tablet 0  . docusate sodium (COLACE) 100 MG capsule Take 1 capsule (100 mg total) by mouth daily as needed for mild constipation. 30 capsule 0  . ELIQUIS 5 MG TABS tablet TAKE 1 TABLET BY MOUTH TWICE A DAY 180 tablet 0  . ezetimibe (ZETIA) 10 MG tablet TAKE 1 TABLET BY MOUTH EVERY DAY 90 tablet 0  . hydrALAZINE (APRESOLINE) 10 MG tablet Take 1 tablet (10 mg total) by mouth 3 (three) times daily. 90 tablet 0  . isosorbide dinitrate (ISORDIL) 10 MG tablet Take 1 tablet (10 mg total) by mouth 3 (three) times daily. 90 tablet 0  . loratadine (CLARITIN) 10 MG tablet Take 10 mg by mouth daily.    . Magnesium Oxide 200 MG TABS Take 1 tablet (200 mg total) by mouth in the morning, at noon, and at bedtime. 270 tablet 0  . rosuvastatin (CRESTOR) 10 MG tablet TAKE 1 TABLET (10 MG TOTAL) BY MOUTH DAILY AT 6 PM. 90 tablet 0  . Magnesium 200 MG TABS SMARTSIG:1  Tablet(s) By Mouth Twice Daily     No current facility-administered medications on file prior to visit.    BP 124/84   Pulse 90   Temp (!) 97 F (36.1 C) (Temporal)   Ht 5\' 4"  (1.626 m)   Wt 149 lb 12 oz (67.9 kg)   SpO2 97%   BMI 25.70 kg/m    Objective:   Physical Exam  Constitutional: She appears well-nourished.  Cardiovascular: Normal rate and regular rhythm.  Respiratory: Effort normal and breath sounds normal.  Musculoskeletal:     Cervical back: Neck supple.  Skin: Skin is warm and dry.  Psychiatric: She has a normal mood and affect.           Assessment & Plan:

## 2019-10-18 NOTE — Patient Instructions (Signed)
Stop by the lab prior to leaving today. I will notify you of your results once received.   You will be contacted regarding your referral to nephrology.  Please let us know if you have not been contacted within two weeks.   It was a pleasure to see you today!

## 2019-10-18 NOTE — Assessment & Plan Note (Signed)
Well controlled in the office today since new regimen of hydralazine and isordil, continue same. BMP pending.

## 2019-10-18 NOTE — Assessment & Plan Note (Addendum)
No longer on Entresto.  Repeat BMP pending.  Referral placed to nephrology.

## 2019-10-19 ENCOUNTER — Encounter: Payer: Medicare HMO | Admitting: Occupational Therapy

## 2019-10-19 ENCOUNTER — Ambulatory Visit: Payer: Medicare HMO | Admitting: Physical Therapy

## 2019-10-19 ENCOUNTER — Encounter: Payer: Medicare HMO | Admitting: Speech Pathology

## 2019-10-20 ENCOUNTER — Ambulatory Visit: Payer: Medicare HMO

## 2019-10-20 DIAGNOSIS — I129 Hypertensive chronic kidney disease with stage 1 through stage 4 chronic kidney disease, or unspecified chronic kidney disease: Secondary | ICD-10-CM | POA: Diagnosis not present

## 2019-10-20 DIAGNOSIS — I5022 Chronic systolic (congestive) heart failure: Secondary | ICD-10-CM | POA: Diagnosis not present

## 2019-10-21 LAB — BASIC METABOLIC PANEL
BUN/Creatinine Ratio: 10 — ABNORMAL LOW (ref 12–28)
BUN: 18 mg/dL (ref 8–27)
CO2: 21 mmol/L (ref 20–29)
Calcium: 9.1 mg/dL (ref 8.7–10.3)
Chloride: 105 mmol/L (ref 96–106)
Creatinine, Ser: 1.75 mg/dL — ABNORMAL HIGH (ref 0.57–1.00)
GFR calc Af Amer: 34 mL/min/{1.73_m2} — ABNORMAL LOW (ref >59)
GFR calc non Af Amer: 29 mL/min/{1.73_m2} — ABNORMAL LOW (ref >59)
Glucose: 108 mg/dL — ABNORMAL HIGH (ref 65–99)
Potassium: 4.9 mmol/L (ref 3.5–5.2)
Sodium: 141 mmol/L (ref 134–144)

## 2019-10-21 LAB — MAGNESIUM: Magnesium: 2.4 mg/dL — ABNORMAL HIGH (ref 1.6–2.3)

## 2019-10-21 LAB — PRO B NATRIURETIC PEPTIDE: NT-Pro BNP: 3188 pg/mL — ABNORMAL HIGH (ref 0–301)

## 2019-10-21 NOTE — Progress Notes (Signed)
Spoke to the patient's daughter and reviewed the most recent blood work.Per the patient's daughter her weight has remained stable and her blood pressure also remains well controlled. She is not fluid overloaded.Her kidney function is improving but not back to baseline.  Patient feels a bit sluggish since initiation of hydralazine and Isordil.  Recommend decreasing Isordil to 5 mg p.o. 3 times daily for now.  They have a referral to see a nephrologist as well.

## 2019-10-26 ENCOUNTER — Encounter: Payer: Medicare HMO | Admitting: Speech Pathology

## 2019-10-26 ENCOUNTER — Ambulatory Visit: Payer: Medicare HMO | Admitting: Physical Therapy

## 2019-10-26 ENCOUNTER — Encounter: Payer: Medicare HMO | Admitting: Occupational Therapy

## 2019-10-31 ENCOUNTER — Telehealth: Payer: Self-pay

## 2019-10-31 NOTE — Telephone Encounter (Signed)
Spoke with pt daughter.  Advised we will watch for transmission.  In meantime, pt needs to f/u with her cardiologist regarding how she is feeling.

## 2019-10-31 NOTE — Telephone Encounter (Signed)
Dr Brennan Bailey patient.) Received a call from Ms. Ging's daughter, Marita [place] in regards to medication reactions. Daughter states that patient was originally put on [place], however had uncomfortable side effects (ie: feeling tired, "drunk" and overall unwell) Dr. Terri Skains decreased her dose, but it did not seem to help. Patient was then prescribed Hydralazine 10mg  + Isosorbide 10mg . Patient is experiencing the same side effects with these medications as well. Daughter states effects are worse today and would like guidance. Please advise. Thank you!

## 2019-10-31 NOTE — Telephone Encounter (Signed)
(  Dr Brennan Bailey patient.) Received a call from [name]'s daughter, [name] in regards to medication reactions. Daughter states that patient was originally put on [place], however had uncomfortable side effects (ie: feeling tired, "drunk" and overall unwell) Dr. Terri Skains decreased her dose, but it did not seem to help. Patient was then prescribed Hydralazine 10mg  + Isosorbide 10mg . Patient is experiencing the same side effects with these medications as well. Daughter states effects are worse today and would like guidance. Please advise. Thank you!

## 2019-10-31 NOTE — Telephone Encounter (Signed)
The pt states she had been feeling bad since her heart failure medication a couple of weeks ago. She has been feeling fatigue, and dizzy. The pt states her blood pressure is up a little.the pt states her head feels really heavy likes she needs to lay down all day. I helped her send a manual transmission for the nurse to review.  The pt monitor is having issues. I told her to leave the monitor on to see if the transmission will come through. They did call her general cardiologist but have not called them back. I told the pt I will have a nurse call her back.

## 2019-10-31 NOTE — Telephone Encounter (Signed)
Stop both meds. Recommend increased hydration. May need alternate therapy such as losartan, as patient tolerated. Keep appt with Dr. Terri Skains on 5/27.  Thanks MJP

## 2019-10-31 NOTE — Telephone Encounter (Signed)
Patient daughter is aware

## 2019-10-31 NOTE — Telephone Encounter (Signed)
[name] is lifesaving drug. She should try 1/2 tablet daily at night and slowly increase to twice daily as she tolerates. Otherwise nothing to do but stop both the above drugs. JG

## 2019-11-02 ENCOUNTER — Encounter: Payer: Medicare HMO | Admitting: Occupational Therapy

## 2019-11-02 ENCOUNTER — Telehealth: Payer: Self-pay

## 2019-11-02 ENCOUNTER — Ambulatory Visit: Payer: Medicare HMO | Admitting: Physical Therapy

## 2019-11-02 NOTE — Progress Notes (Signed)
Follow up visit  Subjective:   [name], female    DOB: [birth year], 69 y.o.   MRN: [id number]   HPI   Chief Complaint  Patient presents with  . Hypertension  . Follow-up    69 y.o. Caucasian female with hypertension, hyperlipidemia, former smoker, nonischemic cardiomyopathy, h/o VF/VT cardiac arrest, now s/p secondary prevention and CRT-D, PAD, history of Arnold-Chiari malformation, h/o stroke.  Patient is here today with her daughter. Given her renal dysfunction, Dr. Terri Skains had switched her Entresto to Bidil. However, with Bidil, she developed severe lightheadedness, and felt like she was "drunk".  The symptoms have improved after stopping BiDil.  Her energy levels remain low.  However, she denies any shortness of breath, leg swelling, orthopnea or PND symptoms.  She is now concerned about her blood pressure being elevated.  Current Outpatient Medications on File Prior to Visit  Medication Sig Dispense Refill  . acetaminophen (TYLENOL) 500 MG tablet Take 1,000 mg by mouth 2 (two) times daily as needed (pain).    [name] aspirin 81 MG chewable tablet Chew 1 tablet (81 mg total) by mouth daily. 30 tablet 0  . carvedilol (COREG) 6.25 MG tablet TAKE 1 TABLET (6.25 MG TOTAL) BY MOUTH 2 (TWO) TIMES DAILY WITH A MEAL. 180 tablet 0  . ELIQUIS 5 MG TABS tablet TAKE 1 TABLET BY MOUTH TWICE A DAY 180 tablet 0  . ezetimibe (ZETIA) 10 MG tablet TAKE 1 TABLET BY MOUTH EVERY DAY 90 tablet 0  . loratadine (CLARITIN) 10 MG tablet Take 10 mg by mouth daily.    . Magnesium 200 MG TABS SMARTSIG:1 Tablet(s) By Mouth Twice Daily    . Magnesium Oxide 200 MG TABS Take 1 tablet (200 mg total) by mouth in the morning, at noon, and at bedtime. 270 tablet 0  . rosuvastatin (CRESTOR) 10 MG tablet TAKE 1 TABLET (10 MG TOTAL) BY MOUTH DAILY AT 6 PM. 90 tablet 0  . docusate sodium (COLACE) 100 MG capsule Take 1 capsule (100 mg total) by mouth daily as needed for mild constipation. (Patient not taking: Reported on  11/03/2019) 30 capsule 0   No current facility-administered medications on file prior to visit.    Cardiovascular & other pertient studies:  EKG 09/02/2019: Electronic atrial pacemaker, consider presence of Bi-ventricular pacemaker.   BiV-ICD placemen 08/25/2019 (Dr. Curt Bears)  Heart Catheterization 08/04/19: LV: Global hypokinesis, upper limit of normal size, EF 25 to 30%. Left main: Normal. LAD: Mild diffuse disease. Proximal LAD has a 30 to 40% stenosis, mid segment has a 20 to 30% stenosis, scattered disease noted in the LAD. Brisk flow. Circumflex: Again scattered disease noted in the circumflex. Mid segment has at most a 30 to 40% stenosis which appears to be eccentric and calcified. RCA: Dominant. Mild diffuse disease again noted. Mid segment after the origin of RV branch has a 70 to 80% stenosis. Brisk flow is evident throughout the RCA. Impression: Findings consistent with nonischemic cardiomyopathy. Although she has significant disease in the right coronary artery, this does not explain her presentation with global hypokinesis, neither does it explain VF arrest as the lesion does not appear to be unstable.   Echocardiogram 08/03/2019: LVEF 25-30%, severely reduced left ventricular systolic function, global hypokinesis, moderate concentric left ventricular hypertrophy, mild MR, 08/04/2019: LVEF 25-30%, severely reduced LV function, global hypokinesis, paradoxical septal motion secondary to LBBB.   Carotid duplex 08/03/2019:  Right Carotid: Velocities in the right ICA are consistent with a 1-39% stenosis. Left Carotid:  Velocities in the left ICA are consistent with a 1-39% stenosis.  Vertebrals: Bilateral vertebral arteries demonstrate antegrade flow. Subclavians: Normal flow hemodynamics were seen in bilateral subclavian arteries.   Recent labs: 10/20/2019: Glucose 108, BUN/Cr 18/1.75. EGFR 29. Na/K 141/4.9. Rest of the CMP normal H/H 11.5/34.7. MCV 88.4. Platelets  201.  09/21/2019: Chol 114, TG 147, HDL 33, LDL 55.  08/24/2019: TSH 0.55 normal  08/03/2019: HbA1C 5.7%   Review of Systems  Cardiovascular: Negative for chest pain, dyspnea on exertion, leg swelling, palpitations and syncope.        Vitals:   11/03/19 0833  BP: (!) 164/75  Pulse: 70  Resp: 17  SpO2: 97%     Body mass index is 25.75 kg/m. Filed Weights   11/03/19 0833  Weight: 150 lb (68 kg)     Objective:   Physical Exam  Constitutional: No distress.  Neck: No JVD present.  Cardiovascular: Normal rate, regular rhythm, normal heart sounds and intact distal pulses.  No murmur heard. Pulmonary/Chest: Effort normal and breath sounds normal. She has no wheezes. She has no rales.  Musculoskeletal:        General: No edema.  Nursing note and vitals reviewed.         Assessment & Recommendations:    69 y.o. Caucasian female with hypertension, hyperlipidemia, former smoker, nonischemic cardiomyopathy, h/o VF/VT cardiac arrest, now s/p secondary prevention and CRT-D, PAD, history of Arnold-Chiari malformation, h/o stroke.  HFrEF: Nonischemic cardiomyopathy, NYHA class II-III S/p BiV-ICD placement. Will enroll in our ICD clinic Unable to use ARNI/ACEi/ARB due to renal dysfunction Unable to use Bidil due to side effects of lightheadedness Increase carvedilol to 6.25 mg tid. No new prescription given today. If she tolerates this well, could consider increasing to 12.5 mg bid at next visit.   Patient and daughter worries about generelized fatigue and anemia. Hb 11.5 She has appt with nephrology to discuss CKD and to assess if anemia is secondary to CKD. If it is deemed to be due to iron deficiency, could consider enrolling in HEARTFID trial (IV iron vs placebo in HFrEF pts with iron deficiency), in future.   Paroxysmal atrial fibrillation: Currently normal sinus rhythm.   Continue rate control with carvedilol CHA2DS2VASc score: 7. Annual stroke risk:  9.6 Continue eliquis 5 mg bid   CAD: Severe RCA stenosis without angina. Continue medical management Not on Aspirin due to ongoing use of eliquis. Continue statin, beta blocker  Mixed hyperlipidemia:  Currently not at goal. Continue Crestor and Zetia.  Keep f/u w/Dr. Terri Skains next week.   Nigel Mormon, MD [facility] Pager: [phone] Office: [phone]

## 2019-11-03 ENCOUNTER — Ambulatory Visit: Payer: Medicare HMO | Admitting: Cardiology

## 2019-11-03 ENCOUNTER — Encounter: Payer: Self-pay | Admitting: Cardiology

## 2019-11-03 ENCOUNTER — Other Ambulatory Visit: Payer: Self-pay

## 2019-11-03 VITALS — BP 164/75 | HR 70 | Resp 17 | Ht 64.0 in | Wt 150.0 lb

## 2019-11-03 DIAGNOSIS — I5022 Chronic systolic (congestive) heart failure: Secondary | ICD-10-CM | POA: Diagnosis not present

## 2019-11-03 DIAGNOSIS — I10 Essential (primary) hypertension: Secondary | ICD-10-CM | POA: Diagnosis not present

## 2019-11-03 DIAGNOSIS — I2510 Atherosclerotic heart disease of native coronary artery without angina pectoris: Secondary | ICD-10-CM | POA: Insufficient documentation

## 2019-11-03 DIAGNOSIS — R42 Dizziness and giddiness: Secondary | ICD-10-CM | POA: Diagnosis not present

## 2019-11-03 DIAGNOSIS — I5020 Unspecified systolic (congestive) heart failure: Secondary | ICD-10-CM | POA: Insufficient documentation

## 2019-11-03 DIAGNOSIS — I5023 Acute on chronic systolic (congestive) heart failure: Secondary | ICD-10-CM | POA: Insufficient documentation

## 2019-11-03 DIAGNOSIS — I129 Hypertensive chronic kidney disease with stage 1 through stage 4 chronic kidney disease, or unspecified chronic kidney disease: Secondary | ICD-10-CM | POA: Insufficient documentation

## 2019-11-03 HISTORY — DX: Chronic systolic (congestive) heart failure: I50.22

## 2019-11-04 ENCOUNTER — Telehealth: Payer: Self-pay

## 2019-11-04 ENCOUNTER — Other Ambulatory Visit: Payer: Self-pay | Admitting: Cardiology

## 2019-11-04 DIAGNOSIS — I5022 Chronic systolic (congestive) heart failure: Secondary | ICD-10-CM

## 2019-11-04 NOTE — Telephone Encounter (Signed)
The pt states she would like to be released to [facility].

## 2019-11-04 NOTE — Telephone Encounter (Signed)
ICM referral call to patient for intro.  Patient referred by device clinic nurse, Roma Kayser, RN.  Spoke with patient explained ICM clinic. She reports that [facility] office will be reaching out to her cardiologist regarding remote monitoring.  She said [place] feels there are too many people involved in her heart care and they will have a discussion with her cardiologist Dr Einar Gip.  No ICM clinic enrollment at this time. Device clinic will continue remote monitoring every 91 days per protocol.

## 2019-11-09 ENCOUNTER — Encounter: Payer: Medicare HMO | Admitting: Occupational Therapy

## 2019-11-09 ENCOUNTER — Ambulatory Visit: Payer: Medicare HMO | Admitting: Physical Therapy

## 2019-11-10 ENCOUNTER — Encounter: Payer: Self-pay | Admitting: Cardiology

## 2019-11-10 ENCOUNTER — Other Ambulatory Visit: Payer: Self-pay

## 2019-11-10 ENCOUNTER — Ambulatory Visit: Payer: Medicare HMO | Admitting: Cardiology

## 2019-11-10 VITALS — BP 148/82 | HR 70 | Ht 64.0 in | Wt 150.0 lb

## 2019-11-10 DIAGNOSIS — Z87891 Personal history of nicotine dependence: Secondary | ICD-10-CM

## 2019-11-10 DIAGNOSIS — I429 Cardiomyopathy, unspecified: Secondary | ICD-10-CM | POA: Diagnosis not present

## 2019-11-10 DIAGNOSIS — Z7901 Long term (current) use of anticoagulants: Secondary | ICD-10-CM

## 2019-11-10 DIAGNOSIS — I469 Cardiac arrest, cause unspecified: Secondary | ICD-10-CM | POA: Diagnosis not present

## 2019-11-10 DIAGNOSIS — I129 Hypertensive chronic kidney disease with stage 1 through stage 4 chronic kidney disease, or unspecified chronic kidney disease: Secondary | ICD-10-CM

## 2019-11-10 DIAGNOSIS — I480 Paroxysmal atrial fibrillation: Secondary | ICD-10-CM

## 2019-11-10 DIAGNOSIS — Z8673 Personal history of transient ischemic attack (TIA), and cerebral infarction without residual deficits: Secondary | ICD-10-CM | POA: Diagnosis not present

## 2019-11-10 DIAGNOSIS — I5022 Chronic systolic (congestive) heart failure: Secondary | ICD-10-CM

## 2019-11-10 DIAGNOSIS — Z4502 Encounter for adjustment and management of automatic implantable cardiac defibrillator: Secondary | ICD-10-CM

## 2019-11-10 DIAGNOSIS — E782 Mixed hyperlipidemia: Secondary | ICD-10-CM

## 2019-11-10 DIAGNOSIS — Z95810 Presence of automatic (implantable) cardiac defibrillator: Secondary | ICD-10-CM | POA: Diagnosis not present

## 2019-11-10 DIAGNOSIS — I447 Left bundle-branch block, unspecified: Secondary | ICD-10-CM | POA: Diagnosis not present

## 2019-11-10 DIAGNOSIS — Z8674 Personal history of sudden cardiac arrest: Secondary | ICD-10-CM | POA: Insufficient documentation

## 2019-11-10 HISTORY — DX: Encounter for adjustment and management of automatic implantable cardiac defibrillator: Z45.02

## 2019-11-10 MED ORDER — CARVEDILOL 12.5 MG PO TABS: 12.5000 mg | ORAL_TABLET | Freq: Two times a day (BID) | ORAL | 2 refills | Status: DC

## 2019-11-10 NOTE — Progress Notes (Signed)
[name] Date of Birth: Mar 24, 1951 MRN: [id number] Primary Care Provider:Clark, Leticia Penna, NP Primary Cardiologist: Rex Kras, DO Electrophysiologist: Dr. Reggy Eye   Date: 11/10/19 Last Office Visit: September 02, 2019  Chief Complaint  Patient presents with  . Congestive Heart Failure    follow up   . Hypertension   HPI  [name] is a 69 y.o. female who presents to the office with a chief complaint of "heart failure management." Patient's past medical history and cardiac risk factors include: Hypertension, hyperlipidemia, former smoker, history of Arnold-Chiari malformation, history of VT/VF, cardiac arrest, nonischemic cardiomyopathy, peripheral vascular disease, paroxysmal atrial fibrillation, left bundle branch block, history of CVA.  Patient is accompanied by [name] at today's office visit.   Since last office visit patient states that she is doing well.  However, her blood pressures have been elevated.  This was to be expected as her blood pressure medications along with heart failure medications have been down titrated due to underlying kidney function.  She was on Entresto initially but due to rising creatinine the medication was held and thereafter spironolactone was held as well.  She was transitioned to hydralazine/Isordil but did not tolerate the medication well and she felt tired and fatigued.  At last office visit her carvedilol was increased to 6.25 mg p.o. 3 times daily.  She has tolerated the medication change well.  Patient's daughter is concerned due to her home blood pressures being labile. They are considering restarting Entresto as patient clinically felt better.   ALLERGIES: Allergies  Allergen Reactions  . Shellfish-Derived Products Anaphylaxis  . Ativan [Lorazepam] Other (See Comments)    Makes "skin crawl" and insomnia  . Buspar [Buspirone] Nausea Only    Sweating and dizzy  . Clarithromycin Swelling  . Codeine Nausea And  Vomiting  . Doxycycline Other (See Comments)    Unknown  . Guaifenesin Other (See Comments)    Palpitations  . Oxycodone Nausea And Vomiting  . Prednisone Nausea And Vomiting and Other (See Comments)    Makes my heart race  . Statins Other (See Comments)    Muscle pain  . Sulfonamide Derivatives Nausea And Vomiting    Achiness  . Tetracycline Nausea Only  . Vicodin [Hydrocodone-Acetaminophen] Nausea And Vomiting  . Famotidine Rash  . Latex Rash  . Omeprazole Nausea Only    Cough, shortness of breath - patient doesn't remember  . Peanut-Containing Drug Products Itching and Rash  . Protonix [Pantoprazole] Rash  . Wellbutrin [Bupropion] Palpitations     MEDICATION LIST PRIOR TO VISIT: Current Outpatient Medications on File Prior to Visit  Medication Sig Dispense Refill  . acetaminophen (TYLENOL) 500 MG tablet Take 1,000 mg by mouth 2 (two) times daily as needed (pain).    [name] aspirin 81 MG chewable tablet Chew 1 tablet (81 mg total) by mouth daily. 30 tablet 0  . docusate sodium (COLACE) 100 MG capsule Take 1 capsule (100 mg total) by mouth daily as needed for mild constipation. 30 capsule 0  . ELIQUIS 5 MG TABS tablet TAKE 1 TABLET BY MOUTH TWICE A DAY 180 tablet 0  . ezetimibe (ZETIA) 10 MG tablet TAKE 1 TABLET BY MOUTH EVERY DAY 90 tablet 0  . loratadine (CLARITIN) 10 MG tablet Take 10 mg by mouth daily.    . Magnesium 200 MG TABS Take 400 mg by mouth 2 (two) times a week.     . rosuvastatin (CRESTOR) 10 MG tablet TAKE 1 TABLET (10 MG  TOTAL) BY MOUTH DAILY AT 6 PM. 90 tablet 0   No current facility-administered medications on file prior to visit.    PAST MEDICAL HISTORY: Past Medical History:  Diagnosis Date  . Allergy   . Anxiety   . Arnold-Chiari malformation ([place])   . Arthritis   . CHF (congestive heart failure) ([place])   . Coronary artery disease   . GERD (gastroesophageal reflux disease)   . H. pylori infection    3 years ago  . H/O cardiac arrest    Hx of VT/Vfib  arrest  . Hyperlipidemia   . Incontinence   . Paroxysmal atrial fibrillation ([place]) 08/06/2019  . Syncope and collapse    Per pt, denies passing out  . Tobacco use disorder   . Unspecified essential hypertension     PAST SURGICAL HISTORY: Past Surgical History:  Procedure Laterality Date  . APPENDECTOMY    . ARNOLD CHIARI SURGERY     neurocranial surgery  . BIV ICD INSERTION CRT-D N/A 08/25/2019   Procedure: BIV ICD INSERTION CRT-D;  Surgeon: Constance Haw, MD;  Location: [facility];  Service: Cardiovascular;  Laterality: N/A;  . BLEPHAROPLASTY     Bil  . C-EYE SURGERY PROCEDURE    . CARDIAC CATHETERIZATION    . CHOLECYSTECTOMY    . EYE SURGERY    . heart failure    . LEFT HEART CATH AND CORONARY ANGIOGRAPHY N/A 08/04/2019   Procedure: LEFT HEART CATH AND CORONARY ANGIOGRAPHY;  Surgeon: Adrian Prows, MD;  Location: [facility];  Service: Cardiovascular;  Laterality: N/A;  . MASS EXCISION Right 12/27/2013   Procedure: MINOR EXCISION OF RIGHT THUMB MUCOID CYST, DEBRIDEMENT OF INTERPHALANGEAL JOINT;  Surgeon: Cammie Sickle, MD;  Location: [facility];  Service: Orthopedics;  Laterality: Right;  . mass on thumb  right  . TOTAL KNEE ARTHROPLASTY Right 02/17/2017  . TOTAL KNEE ARTHROPLASTY Right 02/17/2017   Procedure: TOTAL KNEE ARTHROPLASTY;  Surgeon: Melrose Nakayama, MD;  Location: [facility];  Service: Orthopedics;  Laterality: Right;  . TUBAL LIGATION      FAMILY HISTORY: The patient family history includes Bone cancer in her mother; Cancer in her sister; Diabetes in her sister; Diverticulosis in her sister; Heart disease in her mother; Hypertension in her sister; Tuberculosis in her father.   SOCIAL HISTORY:  The patient  reports that she quit smoking about 3 months ago. Her smoking use included cigarettes. She has a 50.00 pack-year smoking history. She has never used smokeless tobacco. She reports that she does not drink alcohol or use  drugs.  Review of Systems  Constitution: Positive for malaise/fatigue. Negative for chills and fever.  HENT: Negative for ear discharge, ear pain and nosebleeds.   Eyes: Negative for blurred vision and discharge.  Cardiovascular: Negative for chest pain, claudication, dyspnea on exertion, leg swelling, near-syncope, orthopnea, palpitations, paroxysmal nocturnal dyspnea and syncope.  Respiratory: Negative for cough and shortness of breath.   Endocrine: Negative for polydipsia, polyphagia and polyuria.  Hematologic/Lymphatic: Negative for bleeding problem.  Skin: Negative for flushing and nail changes.  Musculoskeletal: Negative for muscle cramps, muscle weakness and myalgias.  Gastrointestinal: Negative for abdominal pain, dysphagia, hematemesis, hematochezia, melena, nausea and vomiting.  Neurological: Negative for dizziness, focal weakness and light-headedness.    PHYSICAL EXAM: Vitals with BMI 11/10/2019 11/03/2019 10/18/2019  Height 5\' 4"  5\' 4"  5\' 4"   Weight 150 lbs 150 lbs 149 lbs 12 oz  BMI 25.73 68.34 19.62  Systolic 229  841 660  Diastolic 82 75 84  Pulse 70 70 90   CONSTITUTIONAL: Well-developed and well-nourished. No acute distress.  SKIN: Skin is warm and dry. No rash noted. No cyanosis. No pallor. No jaundice HEAD: Normocephalic and atraumatic.  EYES: No scleral icterus MOUTH/THROAT: Moist oral membranes.  NECK: No JVD present. No thyromegaly noted.   LYMPHATIC: No visible cervical adenopathy.  CHEST Normal respiratory effort. No intercostal retractions.  BiV ICD noted left infraclavicular region. LUNGS: Clear to auscultation bilaterally.  No stridor. No wheezes. No rales.  CARDIOVASCULAR: Regular, positive Y3-K1, soft holosystolic murmur heard at the apex radiating to axilla, no gallops or rubs appreciated ABDOMINAL: Nonobese, soft, nontender, nondistended, positive bowel sounds in all 4 quadrants no apparent ascites.  EXTREMITIES: Trace bilateral peripheral edema. 2+ right  femoral pulse, 1+ left femoral pulse, none palpable bilateral popliteal, dorsalis pedis or posterior tibial pulses.  Warm to touch bilaterally.  No discoloration or cyanosis present. HEMATOLOGIC: No significant bruising NEUROLOGIC: Oriented to person, place, and time. Nonfocal. Normal muscle tone.  PSYCHIATRIC: Normal mood and affect. Normal behavior. Cooperative  CARDIAC DATABASE: EKG: 08/12/2019: Normal sinus rhythm with ventricular rate of 61 bpm, left axis deviation, left bundle branch block, nonspecific T wave abnormalities.  Prior EKG dated 08/04/2019 shows a normal sinus rhythm, left axis deviation, left bundle branch block  Echocardiogram: 08/03/2019: LVEF 25-30%, severely reduced left ventricular systolic function, global hypokinesis, moderate concentric left ventricular hypertrophy, mild MR, 08/04/2019: LVEF 25-30%, severely reduced LV function, global hypokinesis, paradoxical septal motion secondary to LBBB.   Heart Catheterization: 08/04/19:  LV: Global hypokinesis, upper limit of normal size, EF 25 to 30%. Left main: Normal. LAD: Mild diffuse disease.  Proximal LAD has a 30 to 40% stenosis, mid segment has a 20 to 30% stenosis, scattered disease noted in the LAD.  Brisk flow. Circumflex: Again scattered disease noted in the circumflex.  Mid segment has at most a 30 to 40% stenosis which appears to be eccentric and calcified. RCA: Dominant.  Mild diffuse disease again noted.  Mid segment after the origin of RV branch has a 70 to 80% stenosis.  Brisk flow is evident throughout the RCA. Impression: Findings consistent with nonischemic cardiomyopathy.  Although she has significant disease in the right coronary artery, this does not explain her presentation with global hypokinesis, neither does it explain VF arrest as the lesion does not appear to be unstable.   Carotid duplex: 08/03/2019: Right Carotid: Velocities in the right ICA are consistent with a 1-39% stenosis. Left Carotid:  Velocities in the left ICA are consistent with a 1-39%  stenosis. Vertebrals: Bilateral vertebral arteries demonstrate antegrade flow. Subclavians: Normal flow hemodynamics were seen in bilateral subclavian arteries.  LABORATORY DATA: CBC Latest Ref Rng & Units 10/18/2019 08/26/2019 08/25/2019  WBC 4.0 - 10.5 K/uL 7.8 7.2 6.1  Hemoglobin 12.0 - 15.0 g/dL 11.5(L) 13.1 12.7  Hematocrit 36.0 - 46.0 % 34.7(L) 39.7 38.9  Platelets 150.0 - 400.0 K/uL 201.0 219 261    CMP Latest Ref Rng & Units 10/20/2019 10/18/2019 10/06/2019  Glucose 65 - 99 mg/dL 108(H) 103(H) 133(H)  BUN 8 - 27 mg/dL 18 18 14   Creatinine 0.57 - 1.00 mg/dL 1.75(H) 1.81(H) 1.69(H)  Sodium 134 - 144 mmol/L 141 136 143  Potassium 3.5 - 5.2 mmol/L 4.9 4.5 4.6  Chloride 96 - 106 mmol/L 105 104 107(H)  CO2 20 - 29 mmol/L 21 27 23   Calcium 8.7 - 10.3 mg/dL 9.1 9.0 8.7  Total Protein 6.5 -  8.1 g/dL - - -  Total Bilirubin 0.3 - 1.2 mg/dL - - -  Alkaline Phos 38 - 126 U/L - - -  AST 15 - 41 U/L - - -  ALT 0 - 44 U/L - - -    Lipid Panel     Component Value Date/Time   CHOL 114 09/21/2019 1416   TRIG 147 09/21/2019 1416   HDL 33 (L) 09/21/2019 1416   CHOLHDL 7.5 08/03/2019 0249   VLDL 26 08/03/2019 0249   LDLCALC 55 09/21/2019 1416   LDLDIRECT 193.0 09/02/2016 0839   LABVLDL 26 09/21/2019 1416    Lab Results  Component Value Date   HGBA1C 5.7 (H) 08/03/2019   No components found for: NTPROBNP Lab Results  Component Value Date   TSH 0.550 08/24/2019   TSH 0.75 08/23/2019   TSH 0.66 09/16/2017    FINAL MEDICATION LIST END OF ENCOUNTER: Meds ordered this encounter  Medications  . carvedilol (COREG) 12.5 MG tablet    Sig: Take 1 tablet (12.5 mg total) by mouth 2 (two) times daily with a meal.    Dispense:  60 tablet    Refill:  2    Medications Discontinued During This Encounter  Medication Reason  . Magnesium Oxide 756 MG TABS Duplicate  . carvedilol (COREG) 6.25 MG tablet Dose change     Current Outpatient  Medications:  .  acetaminophen (TYLENOL) 500 MG tablet, Take 1,000 mg by mouth 2 (two) times daily as needed (pain)., Disp: , Rfl:  .  aspirin 81 MG chewable tablet, Chew 1 tablet (81 mg total) by mouth daily., Disp: 30 tablet, Rfl: 0 .  docusate sodium (COLACE) 100 MG capsule, Take 1 capsule (100 mg total) by mouth daily as needed for mild constipation., Disp: 30 capsule, Rfl: 0 .  ELIQUIS 5 MG TABS tablet, TAKE 1 TABLET BY MOUTH TWICE A DAY, Disp: 180 tablet, Rfl: 0 .  ezetimibe (ZETIA) 10 MG tablet, TAKE 1 TABLET BY MOUTH EVERY DAY, Disp: 90 tablet, Rfl: 0 .  loratadine (CLARITIN) 10 MG tablet, Take 10 mg by mouth daily., Disp: , Rfl:  .  Magnesium 200 MG TABS, Take 400 mg by mouth 2 (two) times a week. , Disp: , Rfl:  .  rosuvastatin (CRESTOR) 10 MG tablet, TAKE 1 TABLET (10 MG TOTAL) BY MOUTH DAILY AT 6 PM., Disp: 90 tablet, Rfl: 0 .  carvedilol (COREG) 12.5 MG tablet, Take 1 tablet (12.5 mg total) by mouth 2 (two) times daily with a meal., Disp: 60 tablet, Rfl: 2  IMPRESSION:    ICD-10-CM   1. Chronic HFrEF (heart failure with reduced ejection fraction) (HCC)  E33.29 Basic metabolic panel    Magnesium    Pro b natriuretic peptide (BNP)[facility])    carvedilol (COREG) 12.5 MG tablet  2. Benign hypertensive CKD, stage 1-4 or unspecified chronic kidney disease  I12.9   3. Mixed hyperlipidemia  E78.2   4. Biventricular ICD (implantable cardioverter-defibrillator) in place  Z95.810   5. Cardiac arrest with ventricular fibrillation (HCC)  I46.9    I49.01   6. Cardiomyopathy  I42.9   7. Left bundle branch block  I44.7   8. Paroxysmal atrial fibrillation (HCC)  I48.0   9. Long term current use of anticoagulant  Z79.01   10. History of CVA (cerebrovascular accident)  Z11.73   11. Former smoker  Z87.891      RECOMMENDATIONS: ANJEL PERFETTI is a 69 y.o. female who presents to the  office with a chief complaint of " management of congestive heart failure." Patient's  past medical history and cardiac risk factors include: Hypertension, hyperlipidemia, tobacco use, history of Arnold-Chiari malformation, history of VT/VF, cardiac arrest, nonischemic cardiomyopathy, peripheral vascular disease, paroxysmal atrial fibrillation, left bundle branch block, history of CVA.  Acute on chronic heart failure with reduced ejection fraction, stage C, NYHA class II: Improving  Increase carvedilol to 12.5 mg p.o. twice daily.  We will recheck blood work if kidney function and consider initiation losartan or Entresto.   Strict I's and O's, and daily weights recommended.  Fluid restriction to less than 2 L/day, sodium restriction to less than 1.5 g/day.  Cardiomyopathy, suggestive of nonischemic: See above  Status post BiV ICD status post ventricular fibrillation arrest:  Medtronic model [id number] Claria Quad CRT-D SureScan (serial Number [id number] )   Will enroll the patient into the ICD clinic  Left bundle branch block: Continue to monitor.  Paroxysmal atrial fibrillation:  Rate control: Coreg.  Rhythm control: None.  Thromboembolic prophylaxis currently on Eliquis.  Patient does not endorse any evidence of bleeding. Hb is trending down as noted above will continue to monitor.   CHA2DS2VASc score: 7. Annual stroke risk: 9.6% (congestive heart failure, hypertension, age, history of stroke, medical history of peripheral vascular disease, and gender)  Long-term oral anticoagulation use:  Indication paroxysmal atrial fibrillation.  Patient did not endorse any evidence of bleeding  Mixed hyperlipidemia: Currently not at goal.  Currently on Crestor and Zetia.  We will continue to monitor.  Patient does not endorse any evidence of myalgias.  Most recent lipid profile discussed with the patient and her daughter at today's visit.  History of CVA: Educated on the importance of secondary prevention.  Will defer further management to primary and neurology.  Former  smoker: Since last office visit patient has quit smoking completely.  She is congratulated on her efforts and the progress that she has made.  She is reemphasized the importance of complete smoking cessation.  Orders Placed This Encounter  Procedures  . Basic metabolic panel  . Magnesium  . Pro b natriuretic peptide (BNP)[facility])   --Continue cardiac medications as reconciled in final medication list. --Return in about 4 weeks (around 12/08/2019) for heart failure management... Or sooner if needed. --Continue follow-up with your primary care physician regarding the management of your other chronic comorbid conditions.  Patient's questions and concerns were addressed to her and her husband's satisfaction. She and her husband voices understanding of the instructions provided during this encounter.   This note was created using a voice recognition software as a result there may be grammatical errors inadvertently enclosed that do not reflect the nature of this encounter. Every attempt is made to correct such errors.  Rex Kras, [place], [facility] Pager: [phone] Office: [phone]

## 2019-11-11 ENCOUNTER — Other Ambulatory Visit: Payer: Self-pay | Admitting: Cardiology

## 2019-11-11 DIAGNOSIS — I5022 Chronic systolic (congestive) heart failure: Secondary | ICD-10-CM

## 2019-11-11 LAB — PRO B NATRIURETIC PEPTIDE: NT-Pro BNP: 18452 pg/mL — ABNORMAL HIGH (ref 0–301)

## 2019-11-11 LAB — BASIC METABOLIC PANEL
BUN/Creatinine Ratio: 8 — ABNORMAL LOW (ref 12–28)
BUN: 16 mg/dL (ref 8–27)
CO2: 22 mmol/L (ref 20–29)
Calcium: 9.3 mg/dL (ref 8.7–10.3)
Chloride: 101 mmol/L (ref 96–106)
Creatinine, Ser: 1.92 mg/dL — ABNORMAL HIGH (ref 0.57–1.00)
GFR calc Af Amer: 30 mL/min/{1.73_m2} — ABNORMAL LOW (ref >59)
GFR calc non Af Amer: 26 mL/min/{1.73_m2} — ABNORMAL LOW (ref >59)
Glucose: 88 mg/dL (ref 65–99)
Potassium: 4.6 mmol/L (ref 3.5–5.2)
Sodium: 141 mmol/L (ref 134–144)

## 2019-11-11 LAB — MAGNESIUM: Magnesium: 2.5 mg/dL — ABNORMAL HIGH (ref 1.6–2.3)

## 2019-11-11 MED ORDER — TORSEMIDE 10 MG PO TABS: 10.0000 mg | ORAL_TABLET | Freq: Every morning | ORAL | 0 refills | Status: DC

## 2019-11-11 NOTE — Progress Notes (Signed)
Labs reviewed. Torsemide 10 mg p.o. every morning.  Repeat blood work in 1 week. Plan of care discussed with her daughter [name] at [phone].  Rex Kras, [place], [facility]  Pager: [phone] Office: [phone]

## 2019-11-17 DIAGNOSIS — I5022 Chronic systolic (congestive) heart failure: Secondary | ICD-10-CM | POA: Diagnosis not present

## 2019-11-17 NOTE — Telephone Encounter (Signed)
error 

## 2019-11-18 ENCOUNTER — Telehealth: Payer: Self-pay | Admitting: Cardiology

## 2019-11-18 DIAGNOSIS — I129 Hypertensive chronic kidney disease with stage 1 through stage 4 chronic kidney disease, or unspecified chronic kidney disease: Secondary | ICD-10-CM | POA: Diagnosis not present

## 2019-11-18 DIAGNOSIS — N184 Chronic kidney disease, stage 4 (severe): Secondary | ICD-10-CM | POA: Diagnosis not present

## 2019-11-18 DIAGNOSIS — I509 Heart failure, unspecified: Secondary | ICD-10-CM | POA: Diagnosis not present

## 2019-11-18 LAB — BASIC METABOLIC PANEL
BUN/Creatinine Ratio: 8 — ABNORMAL LOW (ref 12–28)
BUN: 18 mg/dL (ref 8–27)
CO2: 24 mmol/L (ref 20–29)
Calcium: 9.2 mg/dL (ref 8.7–10.3)
Chloride: 102 mmol/L (ref 96–106)
Creatinine, Ser: 2.37 mg/dL — ABNORMAL HIGH (ref 0.57–1.00)
GFR calc Af Amer: 23 mL/min/{1.73_m2} — ABNORMAL LOW (ref >59)
GFR calc non Af Amer: 20 mL/min/{1.73_m2} — ABNORMAL LOW (ref >59)
Glucose: 89 mg/dL (ref 65–99)
Potassium: 4.9 mmol/L (ref 3.5–5.2)
Sodium: 140 mmol/L (ref 134–144)

## 2019-11-18 LAB — PRO B NATRIURETIC PEPTIDE: NT-Pro BNP: 13138 pg/mL — ABNORMAL HIGH (ref 0–301)

## 2019-11-18 LAB — MAGNESIUM: Magnesium: 2.4 mg/dL — ABNORMAL HIGH (ref 1.6–2.3)

## 2019-11-18 NOTE — Telephone Encounter (Signed)
Left a message to call us back to review results.

## 2019-11-23 DIAGNOSIS — N184 Chronic kidney disease, stage 4 (severe): Secondary | ICD-10-CM | POA: Diagnosis not present

## 2019-11-25 DIAGNOSIS — Z4502 Encounter for adjustment and management of automatic implantable cardiac defibrillator: Secondary | ICD-10-CM | POA: Diagnosis not present

## 2019-11-25 DIAGNOSIS — I5022 Chronic systolic (congestive) heart failure: Secondary | ICD-10-CM | POA: Diagnosis not present

## 2019-11-25 DIAGNOSIS — I4901 Ventricular fibrillation: Secondary | ICD-10-CM | POA: Diagnosis not present

## 2019-11-25 DIAGNOSIS — Z95810 Presence of automatic (implantable) cardiac defibrillator: Secondary | ICD-10-CM | POA: Diagnosis not present

## 2019-11-29 ENCOUNTER — Encounter: Payer: Self-pay | Admitting: Cardiology

## 2019-11-29 ENCOUNTER — Telehealth: Payer: Self-pay | Admitting: Cardiology

## 2019-11-29 ENCOUNTER — Other Ambulatory Visit: Payer: Self-pay

## 2019-11-29 ENCOUNTER — Ambulatory Visit: Payer: Medicare HMO | Admitting: Cardiology

## 2019-11-29 VITALS — BP 132/74 | HR 70 | Ht 64.5 in | Wt 150.0 lb

## 2019-11-29 DIAGNOSIS — I472 Ventricular tachycardia, unspecified: Secondary | ICD-10-CM

## 2019-11-29 LAB — CUP PACEART INCLINIC DEVICE CHECK
Battery Remaining Longevity: 83 mo
Battery Voltage: 3.05 V
Brady Statistic AP VP Percent: 89.45 %
Brady Statistic AP VS Percent: 1.25 %
Brady Statistic AS VP Percent: 9.11 %
Brady Statistic AS VS Percent: 0.19 %
Brady Statistic RA Percent Paced: 90.32 %
Brady Statistic RV Percent Paced: 27.2 %
Date Time Interrogation Session: 20210615130900
HighPow Impedance: 63 Ohm
Implantable Lead Implant Date: 20210311
Implantable Lead Implant Date: 20210311
Implantable Lead Implant Date: 20210311
Implantable Lead Location: 753858
Implantable Lead Location: 753859
Implantable Lead Location: 753860
Implantable Lead Model: 4598
Implantable Lead Model: 5076
Implantable Pulse Generator Implant Date: 20210311
Lead Channel Impedance Value: 175.622
Lead Channel Impedance Value: 184.154
Lead Channel Impedance Value: 189.525
Lead Channel Impedance Value: 189.525
Lead Channel Impedance Value: 199.5 Ohm
Lead Channel Impedance Value: 342 Ohm
Lead Channel Impedance Value: 361 Ohm
Lead Channel Impedance Value: 361 Ohm
Lead Channel Impedance Value: 399 Ohm
Lead Channel Impedance Value: 399 Ohm
Lead Channel Impedance Value: 456 Ohm
Lead Channel Impedance Value: 475 Ohm
Lead Channel Impedance Value: 513 Ohm
Lead Channel Impedance Value: 646 Ohm
Lead Channel Impedance Value: 646 Ohm
Lead Channel Impedance Value: 646 Ohm
Lead Channel Impedance Value: 665 Ohm
Lead Channel Impedance Value: 665 Ohm
Lead Channel Pacing Threshold Amplitude: 0.5 V
Lead Channel Pacing Threshold Amplitude: 0.5 V
Lead Channel Pacing Threshold Amplitude: 0.75 V
Lead Channel Pacing Threshold Pulse Width: 0.4 ms
Lead Channel Pacing Threshold Pulse Width: 0.4 ms
Lead Channel Pacing Threshold Pulse Width: 0.6 ms
Lead Channel Sensing Intrinsic Amplitude: 2 mV
Lead Channel Sensing Intrinsic Amplitude: 21.125 mV
Lead Channel Setting Pacing Amplitude: 1.5 V
Lead Channel Setting Pacing Amplitude: 2.75 V
Lead Channel Setting Pacing Amplitude: 2.75 V
Lead Channel Setting Pacing Pulse Width: 0.4 ms
Lead Channel Setting Pacing Pulse Width: 0.6 ms
Lead Channel Setting Sensing Sensitivity: 0.3 mV

## 2019-11-29 MED ORDER — METOPROLOL SUCCINATE ER 100 MG PO TB24
100.0000 mg | ORAL_TABLET | Freq: Every day | ORAL | 3 refills | Status: DC
Start: 2019-11-29 — End: 2019-12-06

## 2019-11-29 NOTE — Patient Instructions (Addendum)
Medication Instructions:  Your physician has recommended you make the following change in your medication:  1. STOP Carvedilol 2. START Toprol 100 mg once daily at bedtime  *If you need a refill on your cardiac medications before your next appointment, please call your pharmacy*   Lab Work: None ordered If you have labs (blood work) drawn today and your tests are completely normal, you will receive your results only by: [name] MyChart Message (if you have MyChart) OR . A paper copy in the mail If you have any lab test that is abnormal or we need to change your treatment, we will call you to review the results.   Testing/Procedures: None ordered   Follow-Up: Remote monitoring is used to monitor your Pacemaker of ICD from home. This monitoring reduces the number of office visits required to check your device to one time per year. It allows [place] to keep an eye on the functioning of your device to ensure it is working properly. You are scheduled for a device check from home on 02/24/2020. You may send your transmission at any time that day. If you have a wireless device, the transmission will be sent automatically. After your physician reviews your transmission, you will receive a postcard with your next transmission date.  At [facility], you and your health needs are our priority.  As part of our continuing mission to provide you with exceptional heart care, we have created designated Provider Care Teams.  These Care Teams include your primary Cardiologist (physician) and Advanced Practice Providers (APPs -  Physician Assistants and Nurse Practitioners) who all work together to provide you with the care you need, when you need it.  We recommend signing up for the patient portal called "MyChart".  Sign up information is provided on this After Visit Summary.  MyChart is used to connect with patients for Virtual Visits (Telemedicine).  Patients are able to view lab/test results, encounter notes,  upcoming appointments, etc.  Non-urgent messages can be sent to your provider as well.   To learn more about what you can do with MyChart, go to [email or web address].    Your next appointment:   9 month(s)  The format for your next appointment:   In Person  Provider:   Allegra Lai, MD   Thank you for choosing [facility]!!   Trinidad Curet, RN [phone]    Other Instructions   Metoprolol Extended-Release Tablets What is this medicine? METOPROLOL (me TOE proe lole) is a beta blocker. It decreases the amount of work your heart has to do and helps your heart beat regularly. It treats high blood pressure and/or prevent chest pain (also called angina). It also treats heart failure. This medicine may be used for other purposes; ask your health care provider or pharmacist if you have questions. COMMON BRAND NAME(S): toprol, Toprol XL What should I tell my health care provider before I take this medicine? They need to know if you have any of these conditions:  diabetes  heart or vessel disease like slow heart rate, worsening heart failure, heart block, sick sinus syndrome or Raynaud's disease  kidney disease  liver disease  lung or breathing disease, like asthma or emphysema  pheochromocytoma  thyroid disease  an unusual or allergic reaction to metoprolol, other beta-blockers, medicines, foods, dyes, or preservatives  pregnant or trying to get pregnant  breast-feeding How should I use this medicine? Take this drug by mouth. Take it as directed on the prescription label at the same time  every day. Take it with food. You may cut the tablet in half if it is scored (has a line in the middle of it). This may help you swallow the tablet if the whole tablet is too big. Be sure to take both halves. Do not take just one-half of the tablet. Keep taking it unless your health care provider tells you to stop. Talk to your health care provider about the use of this drug in  children. While it may be prescribed for children as young as 6 for selected conditions, precautions do apply. Overdosage: If you think you have taken too much of this medicine contact a poison control center or emergency room at once. NOTE: This medicine is only for you. Do not share this medicine with others. What if I miss a dose? If you miss a dose, take it as soon as you can. If it is almost time for your next dose, take only that dose. Do not take double or extra doses. What may interact with this medicine? This medicine may interact with the following medications:  certain medicines for blood pressure, heart disease, irregular heart beat  certain medicines for depression, like monoamine oxidase (MAO) inhibitors, fluoxetine, or paroxetine  clonidine  dobutamine  epinephrine  isoproterenol  reserpine This list may not describe all possible interactions. Give your health care provider a list of all the medicines, herbs, non-prescription drugs, or dietary supplements you use. Also tell them if you smoke, drink alcohol, or use illegal drugs. Some items may interact with your medicine. What should I watch for while using this medicine? Visit your doctor or health care professional for regular check ups. Contact your doctor right away if your symptoms worsen. Check your blood pressure and pulse rate regularly. Ask your health care professional what your blood pressure and pulse rate should be, and when you should contact them. You may get drowsy or dizzy. Do not drive, use machinery, or do anything that needs mental alertness until you know how this medicine affects you. Do not sit or stand up quickly, especially if you are an older patient. This reduces the risk of dizzy or fainting spells. Contact your doctor if these symptoms continue. Alcohol may interfere with the effect of this medicine. Avoid alcoholic drinks. This medicine may increase blood sugar. Ask your healthcare provider if  changes in diet or medicines are needed if you have diabetes. What side effects may I notice from receiving this medicine? Side effects that you should report to your doctor or health care professional as soon as possible:  allergic reactions like skin rash, itching or hives  cold or numb hands or feet  depression  difficulty breathing  faint  fever with sore throat  irregular heartbeat, chest pain  rapid weight gain   signs and symptoms of high blood sugar such as being more thirsty or hungry or having to urinate more than normal. You may also feel very tired or have blurry vision.  swollen legs or ankles Side effects that usually do not require medical attention (report to your doctor or health care professional if they continue or are bothersome):  anxiety or nervousness  change in sex drive or performance  dry skin  headache  nightmares or trouble sleeping  short term memory loss  stomach upset or diarrhea This list may not describe all possible side effects. Call your doctor for medical advice about side effects. You may report side effects to FDA at 1-800-FDA-1088. Where should I keep  my medicine? Keep out of the reach of children and pets. Store at room temperature between 20 and 25 degrees C (68 and 77 degrees F). Throw away any unused drug after the expiration date. NOTE: This sheet is a summary. It may not cover all possible information. If you have questions about this medicine, talk to your doctor, pharmacist, or health care provider.  2020 Elsevier/Gold Standard (2019-01-13 18:23:00)

## 2019-11-29 NOTE — Progress Notes (Signed)
[facility] Note  11/30/2019     CHIEF COMPLAINT Patient presents for Retina Follow Up   HISTORY OF PRESENT ILLNESS: [name] is a 69 y.o. female who presents to the clinic today for:   HPI    Retina Follow Up    Patient presents with  Other.  In both eyes.  This started 9 months ago.  Severity is moderate.  I, the attending physician,  performed the HPI with the patient and updated documentation appropriately.          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 Patient states vision may have been effected by stroke. No eye pain. Has a stroke in Feb 2021 then has cardiac arrest in  March 8th 2021 and had a pacemaker put in August 25 2019.        Last edited by Bernarda Caffey, MD on 11/30/2019  1:31 PM. (History)    pt states she had a stroke in February, she states she had cardiac arrest after that in March, pt states she had a pacemaker placed in March as well, she states she has left side weakness from the stroke, pt states she is going to start seeing Dr. Gershon Crane for her primary care   Referring physician: Pleas Koch, NP [address] [place],  [place] [address]  HISTORICAL INFORMATION:   Selected notes from the MEDICAL RECORD NUMBER Referred by Dr. Aron Baba for retinoschisis OU LEE: 08.29.19 (K. Hallahan) [BCVA: OD: 20/20 OS: 20/20-] Ocular Hx-cataracts OU, glaucoma suspect OU PMH-anxiety, HTN, current smoker    CURRENT MEDICATIONS: No current outpatient medications on file. (Ophthalmic Drugs)   No current facility-administered medications for this visit. (Ophthalmic Drugs)   Current Outpatient Medications (Other)  Medication Sig  . acetaminophen (TYLENOL) 500 MG tablet Take 1,000 mg by mouth 2 (two) times daily as needed (pain).  [name] aspirin 81 MG chewable tablet Chew 1 tablet (81 mg total) by mouth daily.  [name] docusate sodium (COLACE) 100 MG capsule Take 1 capsule (100 mg total) by mouth  daily as needed for mild constipation.  [name] ELIQUIS 5 MG TABS tablet TAKE 1 TABLET BY MOUTH TWICE A DAY  . ezetimibe (ZETIA) 10 MG tablet TAKE 1 TABLET BY MOUTH EVERY DAY  . loratadine (CLARITIN) 10 MG tablet Take 10 mg by mouth daily.  . Magnesium 200 MG TABS Take 400 mg by mouth 2 (two) times a week.   . metoprolol succinate (TOPROL-XL) 100 MG 24 hr tablet Take 1 tablet (100 mg total) by mouth daily. Take with or immediately following a meal.  . rosuvastatin (CRESTOR) 10 MG tablet TAKE 1 TABLET (10 MG TOTAL) BY MOUTH DAILY AT 6 PM.   No current facility-administered medications for this visit. (Other)      REVIEW OF SYSTEMS: ROS    Positive for: Gastrointestinal, Neurological, Musculoskeletal, Cardiovascular, Eyes   Negative for: Constitutional, Skin, Genitourinary, HENT, Endocrine, Respiratory, Psychiatric, Allergic/Imm, Heme/Lymph   Last edited by Theodore Demark, COA on 11/30/2019 12:47 PM. (History)       ALLERGIES Allergies  Allergen Reactions  . Shellfish-Derived Products Anaphylaxis  . Ativan [Lorazepam] Other (See Comments)    Makes "skin crawl" and insomnia  . Buspar [Buspirone] Nausea Only    Sweating and dizzy  . Clarithromycin Swelling  . Codeine Nausea And Vomiting  . Doxycycline Other (See Comments)    Unknown  . Guaifenesin Other (  See Comments)    Palpitations  . Oxycodone Nausea And Vomiting  . Prednisone Nausea And Vomiting and Other (See Comments)    Makes my heart race  . Statins Other (See Comments)    Muscle pain  . Sulfonamide Derivatives Nausea And Vomiting    Achiness  . Tetracycline Nausea Only  . Vicodin [Hydrocodone-Acetaminophen] Nausea And Vomiting  . Famotidine Rash  . Latex Rash  . Omeprazole Nausea Only    Cough, shortness of breath - patient doesn't remember  . Peanut-Containing Drug Products Itching and Rash  . Protonix [Pantoprazole] Rash  . Wellbutrin [Bupropion] Palpitations    PAST MEDICAL HISTORY Past Medical History:   Diagnosis Date  . Allergy   . Anxiety   . Arnold-Chiari malformation ([place])   . Arthritis   . CHF (congestive heart failure) ([place])   . Chronic systolic heart failure ([place]) 11/03/2019  . Coronary artery disease   . Encounter for assessment of implantable cardioverter-defibrillator (ICD) 11/10/2019  . GERD (gastroesophageal reflux disease)   . H. pylori infection    3 years ago  . H/O cardiac arrest    Hx of VT/Vfib arrest  . Hyperlipidemia   . ICD BiV Medtronic model [id number] [place] Quad CRT-D SureScan ICD 08/25/2019   . Incontinence   . Paroxysmal atrial fibrillation ([place]) 08/06/2019  . Syncope and collapse    Per pt, denies passing out  . Tobacco use disorder   . Unspecified essential hypertension    Past Surgical History:  Procedure Laterality Date  . APPENDECTOMY    . ARNOLD CHIARI SURGERY     neurocranial surgery  . BIV ICD INSERTION CRT-D N/A 08/25/2019   Procedure: BIV ICD INSERTION CRT-D;  Surgeon: Constance Haw, MD;  Location: [facility];  Service: Cardiovascular;  Laterality: N/A;  . BLEPHAROPLASTY     Bil  . C-EYE SURGERY PROCEDURE    . CARDIAC CATHETERIZATION    . CHOLECYSTECTOMY    . EYE SURGERY    . heart failure    . LEFT HEART CATH AND CORONARY ANGIOGRAPHY N/A 08/04/2019   Procedure: LEFT HEART CATH AND CORONARY ANGIOGRAPHY;  Surgeon: Adrian Prows, MD;  Location: [facility];  Service: Cardiovascular;  Laterality: N/A;  . MASS EXCISION Right 12/27/2013   Procedure: MINOR EXCISION OF RIGHT THUMB MUCOID CYST, DEBRIDEMENT OF INTERPHALANGEAL JOINT;  Surgeon: Cammie Sickle, MD;  Location: [facility];  Service: Orthopedics;  Laterality: Right;  . mass on thumb  right  . TOTAL KNEE ARTHROPLASTY Right 02/17/2017  . TOTAL KNEE ARTHROPLASTY Right 02/17/2017   Procedure: TOTAL KNEE ARTHROPLASTY;  Surgeon: Melrose Nakayama, MD;  Location: [facility];  Service: Orthopedics;  Laterality: Right;  . TUBAL LIGATION      FAMILY HISTORY Family  History  Problem Relation Age of Onset  . Bone cancer Mother   . Heart disease Mother   . Cancer Sister        metastatic; unknown primary  . Diverticulosis Sister   . Tuberculosis Father   . Diabetes Sister   . Hypertension Sister   . Colon cancer Neg Hx   . Esophageal cancer Neg Hx   . Pancreatic cancer Neg Hx   . Stomach cancer Neg Hx   . Liver disease Neg Hx   . Kidney disease Neg Hx   . Rectal cancer Neg Hx     SOCIAL HISTORY Social History   Tobacco Use  . Smoking status: Former Smoker  Packs/day: 1.00    Years: 50.00    Pack years: 50.00    Types: Cigarettes    Quit date: 08/08/2019    Years since quitting: 0.3  . Smokeless tobacco: Never Used  Vaping Use  . Vaping Use: Never used  Substance Use Topics  . Alcohol use: No    Alcohol/week: 0.0 standard drinks  . Drug use: No         OPHTHALMIC EXAM:  Base Eye Exam    Visual Acuity (Snellen - Linear)      Right Left   Dist cc 20/30 +2 20/20   Dist ph cc NI    Correction: Glasses       Tonometry (Tonopen, 12:43 PM)      Right Left   Pressure 17 14       Pupils      Dark Light Shape React APD   Right 4 3 Round Brisk None   Left 4 3 Round Brisk None       Visual Fields (Counting fingers)      Left Right    Full Full       Extraocular Movement      Right Left    Full, Ortho Full, Ortho       Neuro/Psych    Oriented x3: Yes   Mood/Affect: Normal       Dilation    Both eyes: 1.0% Mydriacyl, 2.5% Phenylephrine @ 12:43 PM        Slit Lamp and Fundus Exam    Slit Lamp Exam      Right Left   Lids/Lashes Dermatochalasis - upper lid, mild Meibomian gland dysfunction Dermatochalasis - upper lid, Meibomian gland dysfunction   Conjunctiva/Sclera Nasal and Temporal Pinguecula Nasal and Temporal Pinguecula   Cornea Arcus, 1-2+ inferior Punctate epithelial erosions Arcus, 2-3+ inferior Punctate epithelial erosions   Anterior Chamber Deep, Narrow angles Deep, clear, Temporal Narrow angle    Iris Round and dilated Round and dilated   Lens 3+ Nuclear sclerosis with brunescence, 3+ Cortical cataract 3+ Nuclear sclerosis with brunescence, 3+ Cortical cataract   Vitreous Vitreous syneresis Vitreous syneresis       Fundus Exam      Right Left   Disc Pink and Sharp, +vascular loops temporal Pink and Sharp, mild central Pallor   C/D Ratio 0.5 0.7   Macula blunted foveal reflex, dot hemorrhages, no edema, +druplets, RPE mottling and clumping Flat, blunted foveal reflex, Retinal pigment epithelial mottling and clumping +druplets, No heme or edema   Vessels Vascular attenuation, Tortuous; refractile plaque at arteriolar bifurcation superior to fovea mid-zone - not present today Vascular attenuation, Tortuous   Periphery Attached, bullous retinoschisis cavity inferiorly from 0600 to 0730 - stable from prior, inferior reticular degeneration, No RT/RD on 360 exam Attached, bullous retinoschisis cavity from 0430 to 0600--stable, reticular degeneration, RPE mottling, No RT/RD         Refraction    Wearing Rx      Sphere Cylinder Axis Add   Right +0.75 +0.75 158 +1.75   Left +0.25 +0.50 032 +1.75   Type: PAL          IMAGING AND PROCEDURES  Imaging and Procedures for @TODAY @  OCT, Retina - OU - Both Eyes       Right Eye Quality was good. Central Foveal Thickness: 276. Progression has been stable. Findings include normal foveal contour, no IRF, no SRF, retinal drusen , central retinal atrophy (Inferotemporal retinoschisis caught on widefield OCT -  stable).   Left Eye Quality was good. Central Foveal Thickness: 265. Progression has been stable. Findings include normal foveal contour, no IRF, no SRF, retinal drusen  (Inferotemporal retinoschisis caught on widefield OCT).   Notes *Images captured and stored on drive  Diagnosis / Impression:  Inferotemporal retinoschisis OU -- caught on widefield OCT and stable from prior Retinal drusen OU   Clinical management:  See  below  Abbreviations: NFP - Normal foveal profile. CME - cystoid macular edema. PED - pigment epithelial detachment. IRF - intraretinal fluid. SRF - subretinal fluid. EZ - ellipsoid zone. ERM - epiretinal membrane. ORA - outer retinal atrophy. ORT - outer retinal tubulation. SRHM - subretinal hyper-reflective material         Color Fundus Photography Optos - OU - Both Eyes       Right Eye Progression has been stable. Disc findings include normal observations. Macula : drusen, hemorrhage. Vessels : tortuous vessels, attenuated. Periphery : (Bullous retinoschisis cavity from 6-730 extending posteriorly to midzone).   Left Eye Progression has been stable. Disc findings include normal observations. Macula : drusen. Vessels : attenuated, tortuous vessels. Periphery : (Bullous retinoschisis cavity from 430-6 extending posteriorly to midzone).   Notes **Images stored on drive**  Impression: Stable, bullous peripheral retinoschisis OU -- no significant change from prior exam/images                ASSESSMENT/PLAN:    ICD-10-CM   1. Retinoschisis of both eyes  H33.103 Color Fundus Photography Optos - OU - Both Eyes  2. Retinal edema  H35.81 OCT, Retina - OU - Both Eyes  3. 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 Retinal artery plaque  I70.8    H35.09   5. Essential hypertension  I10   6. Hypertensive retinopathy of both eyes  H35.033   7. Combined forms of age-related cataract of both eyes  H25.813     1,2. Retinoschisis OU-   - bullous, inferotemporal retinoschisis cavities OU (OD > OS) -- no change from prior  - schisis confirmed by widefield OCT -- stable  - no retinal tears or holes on depressed exam  - fairly large cavity with significant posterior extension -- has been stable since initial visit 02/2018   - discussed findings, prognosis and possible need for treatment if schisis cavity threatens macula  - will continue to monitor  - F/U in 12 months, sooner prn, for  repeat DFE/OCT/Optos  3. Retinal drusen OU  - mild OCT findings -- stable from prior  - monitor  4. Hx of refractile arteriolar plaque superiorly OD  - located at [place] midzone at arteriolar bifurcation -- not present today ?resolved  - good distal perfusion   - monitor  5,6. Hypertensive retinopathy OU  - discussed importance of tight BP control  - monitor  7. Combined form age-related cataract OU-   - The symptoms of cataract, surgical options, and treatments and risks were discussed with patient.  - discussed diagnosis and progression  - pt to f/u at [facility] Ordered this visit:  No orders of the defined types were placed in this encounter.      Return in about 1 year (around 11/29/2020) for f/u retinoschisis OU, DFE, OCT.  There are no Patient Instructions on file for this visit.   Explained the diagnoses, plan, and follow up with the patient and they expressed understanding.  Patient expressed understanding of the importance of proper follow up  care.    This document serves as a record of services personally performed by Gardiner Sleeper, MD, PhD. It was created on their behalf by Ernest Mallick, OA, an ophthalmic assistant. The creation of this record is the provider's dictation and/or activities during the visit.    Electronically signed by: Ernest Mallick, OA 06.16.2021 12:42 AM  Gardiner Sleeper, M.D., Ph.D. Diseases & Surgery of the Retina and [place] 11/30/2019   I have reviewed the above documentation for accuracy and completeness, and I agree with the above. Gardiner Sleeper, M.D., Ph.D. 12/02/19 12:42 AM  Abbreviations: M myopia (nearsighted); A astigmatism; H hyperopia (farsighted); P presbyopia; Mrx spectacle prescription;  CTL contact lenses; OD right eye; OS left eye; OU both eyes  XT exotropia; ET esotropia; PEK punctate epithelial keratitis; PEE punctate epithelial erosions; DES dry eye syndrome;  MGD meibomian gland dysfunction; ATs artificial tears; PFAT's preservative free artificial tears; [place] nuclear sclerotic cataract; PSC posterior subcapsular cataract; ERM epi-retinal membrane; PVD posterior vitreous detachment; RD retinal detachment; DM diabetes mellitus; DR diabetic retinopathy; NPDR non-proliferative diabetic retinopathy; PDR proliferative diabetic retinopathy; CSME clinically significant macular edema; DME diabetic macular edema; dbh dot blot hemorrhages; CWS cotton wool spot; POAG primary open angle glaucoma; C/D cup-to-disc ratio; HVF humphrey visual field; GVF goldmann visual field; OCT optical coherence tomography; IOP intraocular pressure; BRVO Branch retinal vein occlusion; CRVO central retinal vein occlusion; CRAO central retinal artery occlusion; BRAO branch retinal artery occlusion; RT retinal tear; SB scleral buckle; PPV pars plana vitrectomy; VH Vitreous hemorrhage; PRP panretinal laser photocoagulation; IVK intravitreal kenalog; VMT vitreomacular traction; MH Macular hole;  NVD neovascularization of the disc; NVE neovascularization elsewhere; AREDS age related eye disease study; ARMD age related macular degeneration; POAG primary open angle glaucoma; EBMD epithelial/anterior basement membrane dystrophy; ACIOL anterior chamber intraocular lens; IOL intraocular lens; PCIOL posterior chamber intraocular lens; Phaco/IOL phacoemulsification with intraocular lens placement; [place] photorefractive keratectomy; LASIK laser assisted in situ keratomileusis; HTN hypertension; DM diabetes mellitus; COPD chronic obstructive pulmonary disease

## 2019-11-29 NOTE — Progress Notes (Signed)
Electrophysiology Office Note   Date:  11/29/2019   ID:  [name], DOB Oct 23, 1950, MRN [id number]  PCP:  Pleas Koch, NP  Cardiologist:  Einar Gip Primary Electrophysiologist:  Sueko Dimichele [name], MD    Chief Complaint: ICD   History of Present Illness: [name] is a 69 y.o. female who is being seen today for the evaluation of ICD at the request of Pleas Koch, NP. Presenting today for electrophysiology evaluation.  She has a history significant for CVA, VT/VF arrest, torsades, nonischemic cardiomyopathy, PVD, hyperlipidemia, hypertension.  She is now status post Medtronic CRT-D implanted 08/25/2019.  She presented to the hospital after being shocked by her LifeVest.  Today, she denies symptoms of palpitations, chest pain, shortness of breath, orthopnea, PND, lower extremity edema, claudication, dizziness, presyncope, syncope, bleeding, or neurologic sequela. The patient is tolerating medications without difficulties.  Her main complaint today is of fatigue.  She says that she started carvedilol and feels like she cannot do much during the day.  She also states that she was on Entresto previously but this has been stopped due to poor kidney function.  She felt much improved on her is hoping that her kidney function improved so she [name] restart this.   Past Medical History:  Diagnosis Date  . Allergy   . Anxiety   . Arnold-Chiari malformation ([place])   . Arthritis   . CHF (congestive heart failure) ([place])   . Chronic systolic heart failure ([place]) 11/03/2019  . Coronary artery disease   . Encounter for assessment of implantable cardioverter-defibrillator (ICD) 11/10/2019  . GERD (gastroesophageal reflux disease)   . H. pylori infection    3 years ago  . H/O cardiac arrest    Hx of VT/Vfib arrest  . Hyperlipidemia   . ICD BiV Medtronic model [id number] Claria Quad CRT-D SureScan ICD 08/25/2019   . Incontinence   . Paroxysmal atrial fibrillation ([place]) 08/06/2019  .  Syncope and collapse    Per pt, denies passing out  . Tobacco use disorder   . Unspecified essential hypertension    Past Surgical History:  Procedure Laterality Date  . APPENDECTOMY    . ARNOLD CHIARI SURGERY     neurocranial surgery  . BIV ICD INSERTION CRT-D N/A 08/25/2019   Procedure: BIV ICD INSERTION CRT-D;  Surgeon: Constance Haw, MD;  Location: [facility];  Service: Cardiovascular;  Laterality: N/A;  . BLEPHAROPLASTY     Bil  . C-EYE SURGERY PROCEDURE    . CARDIAC CATHETERIZATION    . CHOLECYSTECTOMY    . EYE SURGERY    . heart failure    . LEFT HEART CATH AND CORONARY ANGIOGRAPHY N/A 08/04/2019   Procedure: LEFT HEART CATH AND CORONARY ANGIOGRAPHY;  Surgeon: Adrian Prows, MD;  Location: [facility];  Service: Cardiovascular;  Laterality: N/A;  . MASS EXCISION Right 12/27/2013   Procedure: MINOR EXCISION OF RIGHT THUMB MUCOID CYST, DEBRIDEMENT OF INTERPHALANGEAL JOINT;  Surgeon: Cammie Sickle, MD;  Location: [facility];  Service: Orthopedics;  Laterality: Right;  . mass on thumb  right  . TOTAL KNEE ARTHROPLASTY Right 02/17/2017  . TOTAL KNEE ARTHROPLASTY Right 02/17/2017   Procedure: TOTAL KNEE ARTHROPLASTY;  Surgeon: Melrose Nakayama, MD;  Location: [facility];  Service: Orthopedics;  Laterality: Right;  . TUBAL LIGATION       Current Outpatient Medications  Medication Sig Dispense Refill  . acetaminophen (TYLENOL) 500 MG tablet Take 1,000 mg by  mouth 2 (two) times daily as needed (pain).    [name] aspirin 81 MG chewable tablet Chew 1 tablet (81 mg total) by mouth daily. 30 tablet 0  . docusate sodium (COLACE) 100 MG capsule Take 1 capsule (100 mg total) by mouth daily as needed for mild constipation. 30 capsule 0  . ELIQUIS 5 MG TABS tablet TAKE 1 TABLET BY MOUTH TWICE A DAY 180 tablet 0  . ezetimibe (ZETIA) 10 MG tablet TAKE 1 TABLET BY MOUTH EVERY DAY 90 tablet 0  . loratadine (CLARITIN) 10 MG tablet Take 10 mg by mouth daily.    . Magnesium  200 MG TABS Take 400 mg by mouth 2 (two) times a week.     . rosuvastatin (CRESTOR) 10 MG tablet TAKE 1 TABLET (10 MG TOTAL) BY MOUTH DAILY AT 6 PM. 90 tablet 0   No current facility-administered medications for this visit.    Allergies:   Shellfish-derived products, Ativan [lorazepam], Buspar [buspirone], Clarithromycin, Codeine, Doxycycline, Guaifenesin, Oxycodone, Prednisone, Statins, Sulfonamide derivatives, Tetracycline, Vicodin [hydrocodone-acetaminophen], Famotidine, Latex, Omeprazole, Peanut-containing drug products, Protonix [pantoprazole], and Wellbutrin [bupropion]   Social History:  The patient  reports that she quit smoking about 3 months ago. Her smoking use included cigarettes. She has a 50.00 pack-year smoking history. She has never used smokeless tobacco. She reports that she does not drink alcohol and does not use drugs.   Family History:  The patient's family history includes Bone cancer in her mother; Cancer in her sister; Diabetes in her sister; Diverticulosis in her sister; Heart disease in her mother; Hypertension in her sister; Tuberculosis in her father.    ROS:  Please see the history of present illness.   Otherwise, review of systems is positive for none.   All other systems are reviewed and negative.    PHYSICAL EXAM: VS:  BP 132/74   Pulse 70   Ht 5' 4.5" (1.638 m)   Wt 150 lb (68 kg)   BMI 25.35 kg/m  , BMI Body mass index is 25.35 kg/m. GEN: Well nourished, well developed, in no acute distress  HEENT: normal  Neck: no JVD, carotid bruits, or masses Cardiac: RRR; no murmurs, rubs, or gallops,no edema  Respiratory:  clear to auscultation bilaterally, normal work of breathing GI: soft, nontender, nondistended, + BS MS: no deformity or atrophy  Skin: warm and dry, device pocket is well healed Neuro:  Strength and sensation are intact Psych: euthymic mood, full affect  EKG:  EKG is ordered today. Personal review of the ekg ordered shows AV paced  Device  interrogation is reviewed today in detail.  See PaceArt for details.   Recent Labs: 08/15/2019: ALT 13 08/24/2019: B Natriuretic Peptide 617.4; TSH 0.550 10/18/2019: Hemoglobin 11.5; Platelets 201.0 11/17/2019: BUN 18; Creatinine, Ser 2.37; Magnesium 2.4; NT-Pro BNP 13,138; Potassium 4.9; Sodium 140    Lipid Panel     Component Value Date/Time   CHOL 114 09/21/2019 1416   TRIG 147 09/21/2019 1416   HDL 33 (L) 09/21/2019 1416   CHOLHDL 7.5 08/03/2019 0249   VLDL 26 08/03/2019 0249   LDLCALC 55 09/21/2019 1416   LDLDIRECT 193.0 09/02/2016 0839     Wt Readings from Last 3 Encounters:  11/29/19 150 lb (68 kg)  11/10/19 150 lb (68 kg)  11/03/19 150 lb (68 kg)      Other studies Reviewed: Additional studies/ records that were reviewed today inc lude: TTE  08/03/19 Review of the above records today demonstrates:  1. Severely depressed  LVEF with global hypokinesis. No apical thrombus  visualized on this study, however would consider repeat limited echo with  contrast to exclude LV thrombus. Could also consider TEE for further  evaluation of cardiac embolism.  2. Left ventricular ejection fraction, by estimation, is 25 to 30%. The  left ventricle has severely decreased function. The left ventricle  demonstrates global hypokinesis. There is moderate concentric left  ventricular hypertrophy. Left ventricular  diastolic parameters are indeterminate. Elevated left ventricular  end-diastolic pressure.  3. Right ventricular systolic function is normal. The right ventricular  size is normal.  4. The mitral valve is normal in structure and function. Mild mitral  valve regurgitation.  5. The aortic valve is tricuspid. Aortic valve regurgitation is not  visualized. No aortic stenosis is present.  6. The inferior vena cava is dilated in size with <50% respiratory  variability, suggesting right atrial pressure of 15 mmHg.    ASSESSMENT AND PLAN:  1.  Chronic systolic heart failure  due to nonischemic cardiomyopathy: She is off of her goal-directed medical therapy due to issues with her kidney function.  She did feel better on Entresto and is excited to restart that when her kidney function improves.  She is having quite a bit of weakness and fatigue on carvedilol.  We Ceola Para switch her to Toprol-XL to take in the evenings.  2.  Torsades/VF arrest: Had a shocked by her LifeVest.  Is now status post Medtronic CRT-D implanted 08/25/2019.  Device functioning appropriately.  She has not had ventricular arrhythmias and minimal atrial fibrillation.  Her heart rate curve is fairly flat.  We Lashuna Tamashiro turn on rate response.  Currently on amiodarone.  The patient and her daughter wish for [facility] to follow her device.  3.  Paroxysmal atrial fibrillation: CHA2DS2-VASc of 6.  Currently on amiodarone and Eliquis.    Current medicines are reviewed at length with the patient today.   The patient does not have concerns regarding her medicines.  The following changes were made today: Stop carvedilol, start Toprol-XL  Labs/ tests ordered today include:  Orders Placed This Encounter  Procedures  . EKG 12-Lead     Disposition:   FU with Osmond Steckman 9 months  Signed, Khary Schaben [name], MD  11/29/2019 2:39 PM     [facility] [address] [place] [address] [phone] (office) [phone] (fax)

## 2019-11-30 ENCOUNTER — Encounter (INDEPENDENT_AMBULATORY_CARE_PROVIDER_SITE_OTHER): Payer: Self-pay | Admitting: Ophthalmology

## 2019-11-30 ENCOUNTER — Ambulatory Visit (INDEPENDENT_AMBULATORY_CARE_PROVIDER_SITE_OTHER): Payer: Medicare HMO | Admitting: Ophthalmology

## 2019-11-30 DIAGNOSIS — H33103 Unspecified retinoschisis, bilateral: Secondary | ICD-10-CM

## 2019-11-30 DIAGNOSIS — N184 Chronic kidney disease, stage 4 (severe): Secondary | ICD-10-CM | POA: Diagnosis not present

## 2019-11-30 DIAGNOSIS — I708 Atherosclerosis of other arteries: Secondary | ICD-10-CM | POA: Diagnosis not present

## 2019-11-30 DIAGNOSIS — H25813 Combined forms of age-related cataract, bilateral: Secondary | ICD-10-CM

## 2019-11-30 DIAGNOSIS — H3581 Retinal edema: Secondary | ICD-10-CM | POA: Diagnosis not present

## 2019-11-30 DIAGNOSIS — H35363 Drusen (degenerative) of macula, bilateral: Secondary | ICD-10-CM

## 2019-11-30 DIAGNOSIS — H3509 Other intraretinal microvascular abnormalities: Secondary | ICD-10-CM

## 2019-11-30 DIAGNOSIS — H35033 Hypertensive retinopathy, bilateral: Secondary | ICD-10-CM | POA: Diagnosis not present

## 2019-11-30 DIAGNOSIS — I10 Essential (primary) hypertension: Secondary | ICD-10-CM

## 2019-12-03 ENCOUNTER — Other Ambulatory Visit: Payer: Self-pay | Admitting: Cardiology

## 2019-12-03 DIAGNOSIS — I5022 Chronic systolic (congestive) heart failure: Secondary | ICD-10-CM

## 2019-12-04 NOTE — Telephone Encounter (Signed)
Please call the patient and see how she is doing from HF standpoint (short of breath, LE swelling, trouble laying flat, how many pillows does she use)?

## 2019-12-06 ENCOUNTER — Telehealth: Payer: Self-pay

## 2019-12-06 MED ORDER — CARVEDILOL 12.5 MG PO TABS
12.5000 mg | ORAL_TABLET | Freq: Two times a day (BID) | ORAL | 1 refills | Status: DC
Start: 2019-12-06 — End: 2020-03-05

## 2019-12-06 NOTE — Telephone Encounter (Signed)
Noted and agree. 

## 2019-12-06 NOTE — Telephone Encounter (Signed)
[facility] - Client TELEPHONE ADVICE RECORD [facility] Patient Name: [name] Gender: Female DOB: [birth year] Age: 69 Y 44 M 25 D Return Phone Number: [phone] (Primary) Address: City/State/Zip: [place] [address] Client [facility] - Client Client Site [place] - Day Physician Alma Friendly - NP Contact Type Call Who Is Calling Patient / Member / Family / Caregiver Call Type Triage / Clinical Relationship To Patient Self Return Phone Number [phone] (Primary) Chief Complaint DIFFICULTY TO AWAKEN in anyone Reason for Call Symptomatic / Request for [place] says that she is having a lot of dizziness, and today it is hard to stay awake. She is very groggy and can't keep her head up; she just wants to go to sleep. Translation No Nurse Assessment Nurse: Laurena Bering, RN, Helene Kelp Date/Time Eilene Ghazi Time): 12/06/2019 3:01:15 PM Confirm and document reason for call. If symptomatic, describe symptoms. ---Caller states that she is dizzy and having hard time staying awake. Weak Has the patient had close contact with a person known or suspected to have the novel coronavirus illness OR traveled / lives in area with major community spread (including international travel) in the last 14 days from the onset of symptoms? * If Asymptomatic, screen for exposure and travel within the last 14 days. ---No Does the patient have any new or worsening symptoms? ---Yes Will a triage be completed? ---Yes Related visit to physician within the last 2 weeks? ---No Does the PT have any chronic conditions? (i.e. diabetes, asthma, this includes High risk factors for pregnancy, etc.) ---Yes List chronic conditions. ---MI, CVA, HTN, elevated cholesterol, blood thinner Is this a behavioral health or substance abuse call? ---No Guidelines Guideline Title Affirmed Question Affirmed Notes  Nurse Date/Time (Eastern Time) Dizziness - Lightheadedness SEVERE dizziness (e.g., unable to stand, requires support to walk, feels like passing out now) Laurena Bering, Therapist, sports, Helene Kelp 12/06/2019 3:02:57 PM PLEASE NOTE: All timestamps contained within this report are represented as [place] Standard Time. CONFIDENTIALTY NOTICE: This fax transmission is intended only for the addressee. It contains information that is legally privileged, confidential or otherwise protected from use or disclosure. If you are not the intended recipient, you are strictly prohibited from reviewing, disclosing, copying using or disseminating any of this information or taking any action in reliance on or regarding this information. If you have received this fax in error, please notify us immediately by telephone so that we can arrange for its return to [place]. Phone: [phone], Toll-Free: [phone], Fax: [phone] Page: 2 of 2 Call Id: [id number] [place]. Time Eilene Ghazi Time) Disposition Final User 12/06/2019 2:57:52 PM Send to Urgent Micheal Likens 12/06/2019 3:04:18 PM Go to ED Now (or PCP triage) Yes Laurena Bering, RN, [place] Artist Disagree/Comply Comply Caller Understands Yes PreDisposition Call Doctor Care Advice Given Per Guideline GO TO ED NOW (OR PCP TRIAGE): ANOTHER ADULT SHOULD DRIVE: * It is better and safer if another adult drives instead of you. BRING MEDICINES: * Please bring a list of your current medicines when you go to the Emergency Department (ER). CARE ADVICE given per Dizziness (Adult) guideline. Comments User: Dorothyann Peng, RN Date/Time Eilene Ghazi Time): 12/06/2019 3:08:43 PM No available appointments at office. Refused ER. Will contact cardiologist Referrals REFERRED TO PCP OFFICE GO TO FACILITY REFUSED

## 2019-12-06 NOTE — Telephone Encounter (Signed)
I spoke with pt; pt having dizziness and grogginess and weakness. pts meds were changed by cardiologist. Pts daughter spoke with cardiologist about med changes. Pt said she does not think she needs to go to ED. ED precautions given and pt voiced understanding and thinks will try to work with adjusting meds but if condition changes or worsens pt will consider ED. FYI to Gentry Fitz NP.

## 2019-12-06 NOTE — Telephone Encounter (Signed)
Called and spoke with patient, she says she feels fine, no LE swelling, no SOB, trouble when lies down flat and she only uses 1 pillow to sleep.

## 2019-12-06 NOTE — Telephone Encounter (Signed)
Advised to stop Metoprolol and re-start Carvedilol 12.5 mg BID.    Dtr is agreeable to plan.  She will call office if symptoms don't improve after switching back.

## 2019-12-06 NOTE — Telephone Encounter (Signed)
Will you please find out what's going on?

## 2019-12-07 ENCOUNTER — Telehealth: Payer: Self-pay

## 2019-12-07 ENCOUNTER — Ambulatory Visit (HOSPITAL_COMMUNITY)
Admission: EM | Admit: 2019-12-07 | Discharge: 2019-12-07 | Disposition: A | Discharge status: Home / Self Care | Payer: Medicare HMO | Attending: Internal Medicine | Admitting: Internal Medicine

## 2019-12-07 ENCOUNTER — Other Ambulatory Visit: Payer: Self-pay

## 2019-12-07 ENCOUNTER — Encounter (HOSPITAL_COMMUNITY): Payer: Self-pay | Admitting: Emergency Medicine

## 2019-12-07 DIAGNOSIS — R5383 Other fatigue: Secondary | ICD-10-CM | POA: Diagnosis not present

## 2019-12-07 DIAGNOSIS — R0981 Nasal congestion: Secondary | ICD-10-CM | POA: Diagnosis not present

## 2019-12-07 LAB — COMPREHENSIVE METABOLIC PANEL
ALT: 12 U/L (ref 0–44)
AST: 21 U/L (ref 15–41)
Albumin: 3.9 g/dL (ref 3.5–5.0)
Alkaline Phosphatase: 76 U/L (ref 38–126)
Anion gap: 8 (ref 5–15)
BUN: 14 mg/dL (ref 8–23)
CO2: 26 mmol/L (ref 22–32)
Calcium: 9.1 mg/dL (ref 8.9–10.3)
Chloride: 104 mmol/L (ref 98–111)
Creatinine, Ser: 2.25 mg/dL — ABNORMAL HIGH (ref 0.44–1.00)
GFR calc Af Amer: 25 mL/min — ABNORMAL LOW (ref >60)
GFR calc non Af Amer: 22 mL/min — ABNORMAL LOW (ref >60)
Glucose, Bld: 106 mg/dL — ABNORMAL HIGH (ref 70–99)
Potassium: 4.5 mmol/L (ref 3.5–5.1)
Sodium: 138 mmol/L (ref 135–145)
Total Bilirubin: 0.4 mg/dL (ref 0.3–1.2)
Total Protein: 6.8 g/dL (ref 6.5–8.1)

## 2019-12-07 LAB — CBC WITH DIFFERENTIAL/PLATELET
Abs Immature Granulocytes: 0.01 10*3/uL (ref 0.00–0.07)
Basophils Absolute: 0 10*3/uL (ref 0.0–0.1)
Basophils Relative: 1 %
Eosinophils Absolute: 0.3 10*3/uL (ref 0.0–0.5)
Eosinophils Relative: 8 %
HCT: 37 % (ref 36.0–46.0)
Hemoglobin: 11.8 g/dL — ABNORMAL LOW (ref 12.0–15.0)
Immature Granulocytes: 0 %
Lymphocytes Relative: 27 %
Lymphs Abs: 1.2 10*3/uL (ref 0.7–4.0)
MCH: 28.9 pg (ref 26.0–34.0)
MCHC: 31.9 g/dL (ref 30.0–36.0)
MCV: 90.5 fL (ref 80.0–100.0)
Monocytes Absolute: 0.4 10*3/uL (ref 0.1–1.0)
Monocytes Relative: 10 %
Neutro Abs: 2.4 10*3/uL (ref 1.7–7.7)
Neutrophils Relative %: 54 %
Platelets: 197 10*3/uL (ref 150–400)
RBC: 4.09 MIL/uL (ref 3.87–5.11)
RDW: 13.5 % (ref 11.5–15.5)
WBC: 4.4 10*3/uL (ref 4.0–10.5)
nRBC: 0 % (ref 0.0–0.2)

## 2019-12-07 LAB — TSH: TSH: 1.487 u[IU]/mL (ref 0.350–4.500)

## 2019-12-07 MED ORDER — ONDANSETRON 4 MG PO TBDP
ORAL_TABLET | ORAL | Status: AC
  Filled 2019-12-07: qty 1

## 2019-12-07 MED ORDER — ONDANSETRON 4 MG PO TBDP
4.0000 mg | ORAL_TABLET | Freq: Three times a day (TID) | ORAL | 0 refills | Status: DC | PRN
Start: 2019-12-07 — End: 2020-01-18

## 2019-12-07 MED ORDER — ONDANSETRON 4 MG PO TBDP
4.0000 mg | ORAL_TABLET | Freq: Once | ORAL | Status: AC
  Administered 2019-12-07: 4 mg via ORAL

## 2019-12-07 MED ORDER — FLUTICASONE PROPIONATE 50 MCG/ACT NA SUSP
1.0000 | Freq: Every day | NASAL | 2 refills | Status: DC
Start: 2019-12-07 — End: 2020-03-27

## 2019-12-07 NOTE — Telephone Encounter (Signed)
Daughter called to see if her mother could be seen today due to mother feeling weak and short of breath. Dr. Terri Skains did not have any openings today. He does have openings 12/08/2019. She is calling mother to see if she wants to schedule for Thursday 12/08/2019

## 2019-12-07 NOTE — Telephone Encounter (Signed)
[name] for following up .

## 2019-12-07 NOTE — Telephone Encounter (Signed)
Per Dr. Terri Skains she can be seen either first thing in the morning or the last appt of the day

## 2019-12-07 NOTE — ED Notes (Signed)
Pt has not been vaccinated but refused a covid test today.

## 2019-12-07 NOTE — ED Provider Notes (Addendum)
[facility]    CSN: [id number] Arrival date & time: 12/07/19  1129      History   Chief Complaint Chief Complaint  Patient presents with  . Sinus Problem  . Headache    HPI [name] is a 69 y.o. female history of CHF, CVA comes to urgent care with worsening sinus pressure and headache since Sunday.  Patient says that symptoms have been insidious and is progressively worse.  She complains that she feels weak and somewhat fuzzy headed.  Her oral intake has been poor and she is feeling feeling nauseated.  She has indigestion which is worsened by oral intake.  No diarrhea.  No fever or chills.  No falls or head injury.  No numbness or tingling or extremity weakness.  Patient has not been vaccinated against COVID-19 virus.  No real reasons or hesitations given.   HPI  Past Medical History:  Diagnosis Date  . Allergy   . Anxiety   . Arnold-Chiari malformation ([place])   . Arthritis   . CHF (congestive heart failure) ([place])   . Chronic systolic heart failure ([place]) 11/03/2019  . Coronary artery disease   . Encounter for assessment of implantable cardioverter-defibrillator (ICD) 11/10/2019  . GERD (gastroesophageal reflux disease)   . H. pylori infection    3 years ago  . H/O cardiac arrest    Hx of VT/Vfib arrest  . Hyperlipidemia   . ICD BiV Medtronic model [id number] Claria Quad CRT-D SureScan ICD 08/25/2019   . Incontinence   . Paroxysmal atrial fibrillation ([place]) 08/06/2019  . Syncope and collapse    Per pt, denies passing out  . Tobacco use disorder   . Unspecified essential hypertension     Patient Active Problem List   Diagnosis Date Noted  . Encounter for assessment of implantable cardioverter-defibrillator (ICD) 11/10/2019  . H/O cardiac arrest 08/03/2019 11/10/2019  . Coronary artery disease involving native coronary artery of native heart without angina pectoris 11/03/2019  . Benign hypertensive CKD, stage 1-4 or unspecified chronic kidney disease  11/03/2019  . Chronic systolic heart failure ([place]) 11/03/2019  . Lightheadedness 11/03/2019  . CHF (congestive heart failure) ([place]) 10/18/2019  . Pruritus 09/01/2019  . ICD BiV Medtronic model [id number] Claria Quad CRT-D SureScan ICD 08/25/2019   . Cardiac arrest with ventricular fibrillation ([place]) 08/24/2019  . TIA (transient ischemic attack) 08/15/2019  . Long term current use of anticoagulant 08/14/2019  . Cardiomyopathy ([place]) 08/14/2019  . Paroxysmal atrial fibrillation ([place]) 08/06/2019  . Statin intolerance 08/03/2019  . Dysphagia   . History of CVA (cerebrovascular accident) 08/02/2019  . Hypokalemia 08/02/2019  . Left bundle branch block 08/02/2019  . CKD (chronic kidney disease) stage 3, GFR 30-59 ml/min 12/13/2018  . Primary localized osteoarthritis of right knee 02/17/2017  . Primary osteoarthritis of right knee 02/17/2017  . Preventative health care 09/01/2016  . Vitamin B12 deficiency 09/10/2015  . Insomnia 07/16/2015  . GERD (gastroesophageal reflux disease) 03/26/2015  . Fatigue 10/02/2014  . Other malaise and fatigue 02/08/2014  . HLD (hyperlipidemia) 11/19/2011  . Vaginal dryness, menopausal 11/19/2011  . Adjustment disorder 01/08/2011  . Former smoker 12/19/2008  . Essential hypertension 12/19/2008    Past Surgical History:  Procedure Laterality Date  . APPENDECTOMY    . ARNOLD CHIARI SURGERY     neurocranial surgery  . BIV ICD INSERTION CRT-D N/A 08/25/2019   Procedure: BIV ICD INSERTION CRT-D;  Surgeon: Constance Haw, MD;  Location: [facility];  Service:  Cardiovascular;  Laterality: N/A;  . BLEPHAROPLASTY     Bil  . C-EYE SURGERY PROCEDURE    . CARDIAC CATHETERIZATION    . CHOLECYSTECTOMY    . EYE SURGERY    . heart failure    . LEFT HEART CATH AND CORONARY ANGIOGRAPHY N/A 08/04/2019   Procedure: LEFT HEART CATH AND CORONARY ANGIOGRAPHY;  Surgeon: Adrian Prows, MD;  Location: [facility];  Service: Cardiovascular;  Laterality: N/A;  .  MASS EXCISION Right 12/27/2013   Procedure: MINOR EXCISION OF RIGHT THUMB MUCOID CYST, DEBRIDEMENT OF INTERPHALANGEAL JOINT;  Surgeon: Cammie Sickle, MD;  Location: [facility];  Service: Orthopedics;  Laterality: Right;  . mass on thumb  right  . TOTAL KNEE ARTHROPLASTY Right 02/17/2017  . TOTAL KNEE ARTHROPLASTY Right 02/17/2017   Procedure: TOTAL KNEE ARTHROPLASTY;  Surgeon: Melrose Nakayama, MD;  Location: [facility];  Service: Orthopedics;  Laterality: Right;  . TUBAL LIGATION      OB History   No obstetric history on file.      Home Medications    Prior to Admission medications   Medication Sig Start Date End Date Taking? Authorizing Provider  acetaminophen (TYLENOL) 500 MG tablet Take 1,000 mg by mouth 2 (two) times daily as needed (pain).   Yes [provider]  aspirin 81 MG chewable tablet Chew 1 tablet (81 mg total) by mouth daily. 08/08/19  Yes Alma Friendly, MD  carvedilol (COREG) 12.5 MG tablet Take 1 tablet (12.5 mg total) by mouth 2 (two) times daily. 12/06/19  Yes Camnitz, Will Hassell Done, MD  docusate sodium (COLACE) 100 MG capsule Take 1 capsule (100 mg total) by mouth daily as needed for mild constipation. 09/02/19  Yes Tolia, Sunit, DO  ELIQUIS 5 MG TABS tablet TAKE 1 TABLET BY MOUTH TWICE A DAY 09/27/19  Yes Tolia, Sunit, DO  ezetimibe (ZETIA) 10 MG tablet TAKE 1 TABLET BY MOUTH EVERY DAY 10/03/19  Yes Tolia, Sunit, DO  loratadine (CLARITIN) 10 MG tablet Take 10 mg by mouth daily.   Yes [provider]  Magnesium 200 MG TABS Take 400 mg by mouth 2 (two) times a week.  09/29/19  Yes [provider]  fluticasone (FLONASE) 50 MCG/ACT nasal spray Place 1 spray into both nostrils daily. 12/07/19   Chase Picket, MD  ondansetron (ZOFRAN ODT) 4 MG disintegrating tablet Take 1 tablet (4 mg total) by mouth every 8 (eight) hours as needed for nausea or vomiting. 12/07/19   Chase Picket, MD  rosuvastatin (CRESTOR) 10 MG tablet TAKE 1  TABLET (10 MG TOTAL) BY MOUTH DAILY AT 6 PM. 10/03/19 11/29/19  Rex Kras, DO    Family History Family History  Problem Relation Age of Onset  . Bone cancer Mother   . Heart disease Mother   . Cancer Sister        metastatic; unknown primary  . Diverticulosis Sister   . Tuberculosis Father   . Diabetes Sister   . Hypertension Sister   . Colon cancer Neg Hx   . Esophageal cancer Neg Hx   . Pancreatic cancer Neg Hx   . Stomach cancer Neg Hx   . Liver disease Neg Hx   . Kidney disease Neg Hx   . Rectal cancer Neg Hx     Social History Social History   Tobacco Use  . Smoking status: Former Smoker    Packs/day: 1.00    Years: 50.00    Pack years: 50.00  Types: Cigarettes    Quit date: 08/08/2019    Years since quitting: 0.3  . Smokeless tobacco: Never Used  Vaping Use  . Vaping Use: Never used  Substance Use Topics  . Alcohol use: No    Alcohol/week: 0.0 standard drinks  . Drug use: No     Allergies   Shellfish-derived products, Ativan [lorazepam], Buspar [buspirone], Clarithromycin, Codeine, Doxycycline, Guaifenesin, Oxycodone, Prednisone, Statins, Sulfonamide derivatives, Tetracycline, Vicodin [hydrocodone-acetaminophen], Famotidine, Latex, Omeprazole, Peanut-containing drug products, Protonix [pantoprazole], and Wellbutrin [bupropion]   Review of Systems Review of Systems  Constitutional: Positive for activity change and fatigue. Negative for diaphoresis and fever.  HENT: Negative.   Respiratory: Negative for cough, chest tightness and shortness of breath.   Cardiovascular: Negative for chest pain and palpitations.  Gastrointestinal: Positive for nausea. Negative for diarrhea and vomiting.  Genitourinary: Negative.   Musculoskeletal: Negative.      Physical Exam Triage Vital Signs ED Triage Vitals  Enc Vitals Group     BP 12/07/19 1157 132/78     Pulse Rate 12/07/19 1157 71     Resp 12/07/19 1157 15     Temp 12/07/19 1157 98.8 F (37.1 C)     Temp  Source 12/07/19 1157 Oral     SpO2 12/07/19 1157 97 %     Weight --      Height --      Head Circumference --      Peak Flow --      Pain Score 12/07/19 1153 1     Pain Loc --      Pain Edu? --      Excl. in [place]? --    No data found.  Updated Vital Signs BP 132/78 (BP Location: Right Arm)   Pulse 71   Temp 98.8 F (37.1 C) (Oral)   Resp 15   SpO2 97%   Visual Acuity Right Eye Distance:   Left Eye Distance:   Bilateral Distance:    Right Eye Near:   Left Eye Near:    Bilateral Near:     Physical Exam Constitutional:      General: She is not in acute distress.    Appearance: She is ill-appearing.  Eyes:     Extraocular Movements: Extraocular movements intact.     Right eye: Normal extraocular motion.     Left eye: Normal extraocular motion.     Pupils: Pupils are equal, round, and reactive to light.  Cardiovascular:     Rate and Rhythm: Normal rate and regular rhythm.     Heart sounds: Normal heart sounds. No murmur heard.  No friction rub.  Pulmonary:     Effort: Pulmonary effort is normal.     Breath sounds: Normal breath sounds. No wheezing, rhonchi or rales.  Abdominal:     General: Bowel sounds are normal. There is no distension.     Palpations: Abdomen is soft. There is no mass.  Musculoskeletal:        General: Normal range of motion.  Skin:    General: Skin is warm.     Capillary Refill: Capillary refill takes less than 2 seconds.     Coloration: Skin is not cyanotic or pale.  Neurological:     Mental Status: She is alert.     GCS: GCS eye subscore is 4. GCS verbal subscore is 5. GCS motor subscore is 6.     Cranial Nerves: No cranial nerve deficit or dysarthria.      UC Treatments /  Results  Labs (all labs ordered are listed, but only abnormal results are displayed) Labs Reviewed  CBC WITH DIFFERENTIAL/PLATELET  COMPREHENSIVE METABOLIC PANEL  TSH    EKG   Radiology No results found.  Procedures Procedures (including critical care  time)  Medications Ordered in UC Medications  ondansetron (ZOFRAN-ODT) disintegrating tablet 4 mg (4 mg Oral Given 12/07/19 1230)    Initial Impression / Assessment and Plan / UC Course  I have reviewed the triage vital signs and the nursing notes.  Pertinent labs & imaging results that were available during my care of the patient were reviewed by me and considered in my medical decision making (see chart for details).     1.  Generalized weakness and fatigue: CBC, CMP, TSH Zofran as needed for nausea Patient is encouraged to liberalize oral fluids If labs are abnormal, we will reach out to the patient and modify plan of care Patient is advised to follow-up with primary care physician Patient refused covid-19 testing  2.  Nasal congestion likely allergic rhinitis: Fluticasone nasal spray Continue using saline nasal spray Return to urgent care if you have any worsening symptoms. Final Clinical Impressions(s) / UC Diagnoses   Final diagnoses:  Other fatigue  Nasal congestion   Discharge Instructions   None    ED Prescriptions    Medication Sig Dispense Auth. Provider   ondansetron (ZOFRAN ODT) 4 MG disintegrating tablet Take 1 tablet (4 mg total) by mouth every 8 (eight) hours as needed for nausea or vomiting. 20 tablet Fortino Haag, Myrene Galas, MD   fluticasone (FLONASE) 50 MCG/ACT nasal spray Place 1 spray into both nostrils daily. 16 g Evan Mackie, Myrene Galas, MD     PDMP not reviewed this encounter.   Chase Picket, MD 12/07/19 1244    Chase Picket, MD 12/07/19 [phone]

## 2019-12-07 NOTE — ED Triage Notes (Signed)
Pt c/o sinus pressure and headache onset Sunday. She states today she feels weak and "drunk" She has been taking Claritin once a day. Pt denies fever. Pt states on Monday night she was freezing and woke up sweating.

## 2019-12-08 ENCOUNTER — Encounter: Payer: Self-pay | Admitting: Cardiology

## 2019-12-08 ENCOUNTER — Ambulatory Visit: Payer: Medicare HMO | Admitting: Cardiology

## 2019-12-08 VITALS — BP 140/60 | HR 72 | Ht 64.5 in | Wt 150.8 lb

## 2019-12-08 DIAGNOSIS — Z8679 Personal history of other diseases of the circulatory system: Secondary | ICD-10-CM | POA: Diagnosis not present

## 2019-12-08 DIAGNOSIS — I472 Ventricular tachycardia, unspecified: Secondary | ICD-10-CM

## 2019-12-08 DIAGNOSIS — I469 Cardiac arrest, cause unspecified: Secondary | ICD-10-CM

## 2019-12-08 DIAGNOSIS — I129 Hypertensive chronic kidney disease with stage 1 through stage 4 chronic kidney disease, or unspecified chronic kidney disease: Secondary | ICD-10-CM

## 2019-12-08 DIAGNOSIS — I5022 Chronic systolic (congestive) heart failure: Secondary | ICD-10-CM

## 2019-12-08 DIAGNOSIS — Z8673 Personal history of transient ischemic attack (TIA), and cerebral infarction without residual deficits: Secondary | ICD-10-CM

## 2019-12-08 DIAGNOSIS — Z7901 Long term (current) use of anticoagulants: Secondary | ICD-10-CM

## 2019-12-08 DIAGNOSIS — I480 Paroxysmal atrial fibrillation: Secondary | ICD-10-CM

## 2019-12-08 DIAGNOSIS — Z87891 Personal history of nicotine dependence: Secondary | ICD-10-CM

## 2019-12-08 DIAGNOSIS — Z95810 Presence of automatic (implantable) cardiac defibrillator: Secondary | ICD-10-CM

## 2019-12-08 DIAGNOSIS — I447 Left bundle-branch block, unspecified: Secondary | ICD-10-CM

## 2019-12-08 DIAGNOSIS — E782 Mixed hyperlipidemia: Secondary | ICD-10-CM

## 2019-12-08 MED ORDER — APIXABAN 5 MG PO TABS: 5.0000 mg | ORAL_TABLET | Freq: Two times a day (BID) | ORAL | 3 refills | Status: DC

## 2019-12-08 NOTE — Progress Notes (Signed)
[name] Date of Birth: [birth year] MRN: [id number] Primary Care Provider:Clark, Leticia Penna, NP Primary Cardiologist: Rex Kras, DO Electrophysiologist: Dr. Reggy Eye   Date: 12/08/19 Last Office Visit: 11/10/2019  Chief Complaint  Patient presents with  . Follow-up  . Fatigue  . Shortness of Breath   HPI  [name] is a 69 y.o. female who presents to the office with a chief complaint of "fatigue." Patient's past medical history and cardiac risk factors include: Hypertension, hyperlipidemia, former smoker, history of Arnold-Chiari malformation, history of VT/VF, history of torsades, cardiac arrest, nonischemic cardiomyopathy, peripheral vascular disease, paroxysmal atrial fibrillation, left bundle branch block, history of CVA.  Patient is accompanied by [name] at today's office visit.   Patient's daughter called the office yesterday requesting a follow-up appointment sooner than her scheduled visit.  According the patient's daughter she has been feeling tired, fatigue, and not at her baseline.  He had gone to urgent care yesterday to have blood work done which were independently reviewed with her at today's office visit.  Patient's kidney function remains stable with mild improvement in serum creatinine.  Patient is not hypotensive or bradycardic at today's office visit.  EKG also performed.  Recently patient was seen by Dr. Curt Bears, please refer to his last office note.  During that encounter her carvedilol was switched to metoprolol.  Patient is attributing her symptoms to the recent transition to metoprolol.  Therefore, about a day ago they stopped metoprolol and restarted back on carvedilol.  Patient denies orthopnea, paroxysmal nocturnal dyspnea or lower extremity swelling.  No recent hospitalizations or congestive heart failure.    Patient also has an upcoming office visit with nephrologist.  ALLERGIES: Allergies  Allergen Reactions  .  Shellfish-Derived Products Anaphylaxis  . Ativan [Lorazepam] Other (See Comments)    Makes "skin crawl" and insomnia  . Buspar [Buspirone] Nausea Only    Sweating and dizzy  . Clarithromycin Swelling  . Codeine Nausea And Vomiting  . Doxycycline Other (See Comments)    Unknown  . Guaifenesin Other (See Comments)    Palpitations  . Oxycodone Nausea And Vomiting  . Prednisone Nausea And Vomiting and Other (See Comments)    Makes my heart race  . Statins Other (See Comments)    Muscle pain  . Sulfonamide Derivatives Nausea And Vomiting    Achiness  . Tetracycline Nausea Only  . Vicodin [Hydrocodone-Acetaminophen] Nausea And Vomiting  . Famotidine Rash  . Latex Rash  . Omeprazole Nausea Only    Cough, shortness of breath - patient doesn't remember  . Peanut-Containing Drug Products Itching and Rash  . Protonix [Pantoprazole] Rash  . Wellbutrin [Bupropion] Palpitations     MEDICATION LIST PRIOR TO VISIT: Current Outpatient Medications on File Prior to Visit  Medication Sig Dispense Refill  . acetaminophen (TYLENOL) 500 MG tablet Take 1,000 mg by mouth 2 (two) times daily as needed (pain).    [name] aspirin 81 MG chewable tablet Chew 1 tablet (81 mg total) by mouth daily. 30 tablet 0  . carvedilol (COREG) 12.5 MG tablet Take 1 tablet (12.5 mg total) by mouth 2 (two) times daily. 180 tablet 1  . docusate sodium (COLACE) 100 MG capsule Take 1 capsule (100 mg total) by mouth daily as needed for mild constipation. 30 capsule 0  . ezetimibe (ZETIA) 10 MG tablet TAKE 1 TABLET BY MOUTH EVERY DAY 90 tablet 0  . fluticasone (FLONASE) 50 MCG/ACT nasal spray Place 1 spray into both nostrils daily. [place]  g 2  . loratadine (CLARITIN) 10 MG tablet Take 10 mg by mouth daily.    . Magnesium 200 MG TABS Take 400 mg by mouth 2 (two) times daily.     . ondansetron (ZOFRAN ODT) 4 MG disintegrating tablet Take 1 tablet (4 mg total) by mouth every 8 (eight) hours as needed for nausea or vomiting. 20 tablet 0   . rosuvastatin (CRESTOR) 10 MG tablet TAKE 1 TABLET (10 MG TOTAL) BY MOUTH DAILY AT 6 PM. 90 tablet 0   No current facility-administered medications on file prior to visit.    PAST MEDICAL HISTORY: Past Medical History:  Diagnosis Date  . Allergy   . Anxiety   . Arnold-Chiari malformation ([place])   . Arthritis   . CHF (congestive heart failure) ([place])   . Chronic systolic heart failure ([place]) 11/03/2019  . Coronary artery disease   . Encounter for assessment of implantable cardioverter-defibrillator (ICD) 11/10/2019  . GERD (gastroesophageal reflux disease)   . H. pylori infection    3 years ago  . H/O cardiac arrest    Hx of VT/Vfib arrest  . Hyperlipidemia   . ICD BiV Medtronic model [id number] Claria Quad CRT-D SureScan ICD 08/25/2019   . Incontinence   . Paroxysmal atrial fibrillation ([place]) 08/06/2019  . Syncope and collapse    Per pt, denies passing out  . Tobacco use disorder   . Unspecified essential hypertension     PAST SURGICAL HISTORY: Past Surgical History:  Procedure Laterality Date  . APPENDECTOMY    . ARNOLD CHIARI SURGERY     neurocranial surgery  . BIV ICD INSERTION CRT-D N/A 08/25/2019   Procedure: BIV ICD INSERTION CRT-D;  Surgeon: Constance Haw, MD;  Location: [facility];  Service: Cardiovascular;  Laterality: N/A;  . BLEPHAROPLASTY     Bil  . C-EYE SURGERY PROCEDURE    . CARDIAC CATHETERIZATION    . CHOLECYSTECTOMY    . EYE SURGERY    . heart failure    . LEFT HEART CATH AND CORONARY ANGIOGRAPHY N/A 08/04/2019   Procedure: LEFT HEART CATH AND CORONARY ANGIOGRAPHY;  Surgeon: Adrian Prows, MD;  Location: [facility];  Service: Cardiovascular;  Laterality: N/A;  . MASS EXCISION Right 12/27/2013   Procedure: MINOR EXCISION OF RIGHT THUMB MUCOID CYST, DEBRIDEMENT OF INTERPHALANGEAL JOINT;  Surgeon: Cammie Sickle, MD;  Location: [facility];  Service: Orthopedics;  Laterality: Right;  . mass on thumb  right  . TOTAL KNEE  ARTHROPLASTY Right 02/17/2017  . TOTAL KNEE ARTHROPLASTY Right 02/17/2017   Procedure: TOTAL KNEE ARTHROPLASTY;  Surgeon: Melrose Nakayama, MD;  Location: [facility];  Service: Orthopedics;  Laterality: Right;  . TUBAL LIGATION      FAMILY HISTORY: The patient family history includes Bone cancer in her mother; Cancer in her sister; Diabetes in her sister; Diverticulosis in her sister; Heart disease in her mother; Hypertension in her sister; Tuberculosis in her father.   SOCIAL HISTORY:  The patient  reports that she quit smoking about 4 months ago. Her smoking use included cigarettes. She has a 50.00 pack-year smoking history. She has never used smokeless tobacco. She reports that she does not drink alcohol and does not use drugs.  Review of Systems  Constitutional: Positive for decreased appetite and malaise/fatigue. Negative for chills and fever.  HENT: Negative for ear discharge, ear pain and nosebleeds.   Eyes: Negative for blurred vision and discharge.  Cardiovascular: Negative for chest pain, claudication,  dyspnea on exertion, leg swelling, near-syncope, orthopnea, palpitations, paroxysmal nocturnal dyspnea and syncope.  Respiratory: Negative for cough and shortness of breath.   Endocrine: Negative for polydipsia, polyphagia and polyuria.  Hematologic/Lymphatic: Negative for bleeding problem.  Skin: Negative for flushing and nail changes.  Musculoskeletal: Negative for muscle cramps, muscle weakness and myalgias.  Gastrointestinal: Negative for abdominal pain, dysphagia, hematemesis, hematochezia, melena, nausea and vomiting.  Neurological: Negative for dizziness, focal weakness and light-headedness.  Psychiatric/Behavioral: Positive for depression. Negative for suicidal ideas.   PHYSICAL EXAM: Vitals with BMI 12/08/2019 12/07/2019 11/29/2019  Height 5' 4.5" - 5' 4.5"  Weight 150 lbs 13 oz - 150 lbs  BMI 02.72 - 53.66  Systolic 440 347 425  Diastolic 60 78 74  Pulse 72 71 70    CONSTITUTIONAL: Appears older than stated age, hemodynamically stable, no acute distress.    SKIN: Skin is warm and dry. No rash noted. No cyanosis. No pallor. No jaundice HEAD: Normocephalic and atraumatic.  EYES: No scleral icterus MOUTH/THROAT: Moist oral membranes.  NECK: No JVD present. No thyromegaly noted.   LYMPHATIC: No visible cervical adenopathy.  CHEST Normal respiratory effort. No intercostal retractions.  BiV ICD noted left infraclavicular region. LUNGS: Clear to auscultation bilaterally.  No stridor. No wheezes. No rales.  CARDIOVASCULAR: Regular, positive Z5-G3, soft holosystolic murmur heard at the apex radiating to axilla, no gallops or rubs appreciated ABDOMINAL: Nonobese, soft, nontender, nondistended, positive bowel sounds in all 4 quadrants no apparent ascites.  EXTREMITIES: Trace bilateral peripheral edema. 2+ right femoral pulse, 1+ left femoral pulse, none palpable bilateral popliteal, dorsalis pedis or posterior tibial pulses.  Warm to touch bilaterally.  No discoloration or cyanosis present. HEMATOLOGIC: No significant bruising NEUROLOGIC: Oriented to person, place, and time. Nonfocal. Normal muscle tone.  PSYCHIATRIC: Normal mood and affect. Normal behavior. Cooperative  CARDIAC DATABASE: EKG: 08/12/2019: Normal sinus rhythm with ventricular rate of 61 bpm, left axis deviation, left bundle branch block, nonspecific T wave abnormalities.  Prior EKG dated 08/04/2019 shows a normal sinus rhythm, left axis deviation, left bundle branch block. 12/08/2019: Atrial paced,  Ventricular sensed rhythm.  Echocardiogram: 08/03/2019: LVEF 25-30%, severely reduced left ventricular systolic function, global hypokinesis, moderate concentric left ventricular hypertrophy, mild MR, 08/04/2019: LVEF 25-30%, severely reduced LV function, global hypokinesis, paradoxical septal motion secondary to LBBB.   Heart Catheterization: 08/04/19:  LV: Global hypokinesis, upper limit of normal  size, EF 25 to 30%. Left main: Normal. LAD: Mild diffuse disease.  Proximal LAD has a 30 to 40% stenosis, mid segment has a 20 to 30% stenosis, scattered disease noted in the LAD.  Brisk flow. Circumflex: Again scattered disease noted in the circumflex.  Mid segment has at most a 30 to 40% stenosis which appears to be eccentric and calcified. RCA: Dominant.  Mild diffuse disease again noted.  Mid segment after the origin of RV branch has a 70 to 80% stenosis.  Brisk flow is evident throughout the RCA. Impression: Findings consistent with nonischemic cardiomyopathy.  Although she has significant disease in the right coronary artery, this does not explain her presentation with global hypokinesis, neither does it explain VF arrest as the lesion does not appear to be unstable.   Carotid duplex: 08/03/2019: Right Carotid: Velocities in the right ICA are consistent with a 1-39% stenosis. Left Carotid: Velocities in the left ICA are consistent with a 1-39%  stenosis. Vertebrals: Bilateral vertebral arteries demonstrate antegrade flow. Subclavians: Normal flow hemodynamics were seen in bilateral subclavian arteries.  LABORATORY DATA: CBC  Latest Ref Rng & Units 12/07/2019 10/18/2019 08/26/2019  WBC 4.0 - 10.5 K/uL 4.4 7.8 7.2  Hemoglobin 12.0 - 15.0 g/dL 11.8(L) 11.5(L) 13.1  Hematocrit 36 - 46 % 37.0 34.7(L) 39.7  Platelets 150 - 400 K/uL 197 201.0 219    CMP Latest Ref Rng & Units 12/07/2019 11/17/2019 11/10/2019  Glucose 70 - 99 mg/dL 106(H) 89 88  BUN 8 - 23 mg/dL 14 18 16   Creatinine 0.44 - 1.00 mg/dL 2.25(H) 2.37(H) 1.92(H)  Sodium 135 - 145 mmol/L 138 140 141  Potassium 3.5 - 5.1 mmol/L 4.5 4.9 4.6  Chloride 98 - 111 mmol/L 104 102 101  CO2 22 - 32 mmol/L 26 24 22   Calcium 8.9 - 10.3 mg/dL 9.1 9.2 9.3  Total Protein 6.5 - 8.1 g/dL 6.8 - -  Total Bilirubin 0.3 - 1.2 mg/dL 0.4 - -  Alkaline Phos 38 - 126 U/L 76 - -  AST 15 - 41 U/L 21 - -  ALT 0 - 44 U/L 12 - -    Lipid Panel     Component  Value Date/Time   CHOL 114 09/21/2019 1416   TRIG 147 09/21/2019 1416   HDL 33 (L) 09/21/2019 1416   CHOLHDL 7.5 08/03/2019 0249   VLDL 26 08/03/2019 0249   LDLCALC 55 09/21/2019 1416   LDLDIRECT 193.0 09/02/2016 0839   LABVLDL 26 09/21/2019 1416    Lab Results  Component Value Date   HGBA1C 5.7 (H) 08/03/2019   No components found for: NTPROBNP Lab Results  Component Value Date   TSH 1.487 12/07/2019   TSH 0.550 08/24/2019   TSH 0.75 08/23/2019    FINAL MEDICATION LIST END OF ENCOUNTER:   Current Outpatient Medications:  .  acetaminophen (TYLENOL) 500 MG tablet, Take 1,000 mg by mouth 2 (two) times daily as needed (pain)., Disp: , Rfl:  .  aspirin 81 MG chewable tablet, Chew 1 tablet (81 mg total) by mouth daily., Disp: 30 tablet, Rfl: 0 .  carvedilol (COREG) 12.5 MG tablet, Take 1 tablet (12.5 mg total) by mouth 2 (two) times daily., Disp: 180 tablet, Rfl: 1 .  docusate sodium (COLACE) 100 MG capsule, Take 1 capsule (100 mg total) by mouth daily as needed for mild constipation., Disp: 30 capsule, Rfl: 0 .  ezetimibe (ZETIA) 10 MG tablet, TAKE 1 TABLET BY MOUTH EVERY DAY, Disp: 90 tablet, Rfl: 0 .  fluticasone (FLONASE) 50 MCG/ACT nasal spray, Place 1 spray into both nostrils daily., Disp: 16 g, Rfl: 2 .  loratadine (CLARITIN) 10 MG tablet, Take 10 mg by mouth daily., Disp: , Rfl:  .  Magnesium 200 MG TABS, Take 400 mg by mouth 2 (two) times daily. , Disp: , Rfl:  .  ondansetron (ZOFRAN ODT) 4 MG disintegrating tablet, Take 1 tablet (4 mg total) by mouth every 8 (eight) hours as needed for nausea or vomiting., Disp: 20 tablet, Rfl: 0 .  rosuvastatin (CRESTOR) 10 MG tablet, TAKE 1 TABLET (10 MG TOTAL) BY MOUTH DAILY AT 6 PM., Disp: 90 tablet, Rfl: 0 .  apixaban (ELIQUIS) 5 MG TABS tablet, Take 1 tablet (5 mg total) by mouth 2 (two) times daily., Disp: 180 tablet, Rfl: 3  IMPRESSION:    ICD-10-CM   1. Chronic HFrEF (heart failure with reduced ejection fraction) (HCC)  I50.22  PCV ECHOCARDIOGRAM COMPLETE  2. Paroxysmal atrial fibrillation (HCC)  I48.0 apixaban (ELIQUIS) 5 MG TABS tablet    EKG 12-Lead  3. Long term current use of anticoagulant  Z79.01 apixaban (ELIQUIS) 5 MG TABS tablet  4. Benign hypertensive CKD, stage 1-4 or unspecified chronic kidney disease  I12.9   5. Cardiac arrest with ventricular fibrillation (HCC)  I46.9    I49.01   6. Biventricular ICD (implantable cardioverter-defibrillator) in place  Z95.810   7. History of torsades de pointes  Z86.79   8. VT (ventricular tachycardia) (HCC)  I47.2   9. Mixed hyperlipidemia  E78.2   10. Left bundle branch block  I44.7   11. History of CVA (cerebrovascular accident)  Z24.73   12. Former smoker  Z87.891      RECOMMENDATIONS: CASSANDRA HARBOLD is a 69 y.o. female whose past medical history and cardiac risk factors include: Hypertension, hyperlipidemia, former smoker, history of Arnold-Chiari malformation, history of VT/VF, history of torsades, cardiac arrest, nonischemic cardiomyopathy, peripheral vascular disease, paroxysmal atrial fibrillation, left bundle branch block, history of CVA.  Chronic heart failure with reduced ejection fraction, stage C, NYHA class II:  Continue carvedilol to 12.5 mg p.o. twice daily.  Unable to be on ARB's are Arni secondary to worsening renal function.  Patient was also on hydralazine/Isordil combination and was not able to tolerate it secondary to feeling tired, fatigued.  Strict I's and O's, and daily weights recommended.  Fluid restriction to less than 2 L/day, sodium restriction to less than 1.5 g/day.  Plan echo to evaluate LV function.   Cardiomyopathy, suggestive of nonischemic: See above  Status post BiV ICD status post ventricular fibrillation arrest: Patient  Medtronic model [id number] Claria Quad CRT-D SureScan (serial Number [id number] )   Last device check reviewed with patient and daughter.   Recently followed up with Dr. Curt Bears since last office  visit.  Left bundle branch block: Continue to monitor.  Paroxysmal atrial fibrillation:  Rate control: Coreg.  Rhythm control: None.  Thromboembolic prophylaxis currently on Eliquis.  Patient does not endorse any evidence of bleeding.  CHA2DS2VASc score: 7. Annual stroke risk: 9.6% (congestive heart failure, hypertension, age, history of stroke, medical history of peripheral vascular disease, and gender)  Long-term oral anticoagulation use:  Indication paroxysmal atrial fibrillation.  Patient did not endorse any evidence of bleeding  Mixed hyperlipidemia: Currently not at goal.  Currently on Crestor and Zetia.  We will continue to monitor.  Patient does not endorse any evidence of myalgias.  History of CVA: Educated on the importance of secondary prevention.  Will defer further management to primary and neurology.  Former smoker: Educated on the importance of continued smoking cessation.  Orders Placed This Encounter  Procedures  . EKG 12-Lead  . PCV ECHOCARDIOGRAM COMPLETE   --Continue cardiac medications as reconciled in final medication list. --Return in about 3 months (around 03/09/2020) for heart failure management... Or sooner if needed. --Continue follow-up with your primary care physician regarding the management of your other chronic comorbid conditions.  Patient's questions and concerns were addressed to her and her daughter's satisfaction. She and her daughter voices understanding of the instructions provided during this encounter.   This note was created using a voice recognition software as a result there may be grammatical errors inadvertently enclosed that do not reflect the nature of this encounter. Every attempt is made to correct such errors.  Rex Kras, [place], [facility] Pager: [phone] Office: [phone]

## 2019-12-09 ENCOUNTER — Telehealth: Payer: Self-pay | Admitting: Primary Care

## 2019-12-09 NOTE — Telephone Encounter (Signed)
Patient's daughter, [name], called.  Patient wanted to switch from [name] to [employer].  [name] is a patient of Debbie's and patient would like to see the same provider. Can patient switch? [name] said patient just saw her Cardiologist and he wanted patient to follow up with PCP about patient's b-12, anemia, and depression.

## 2019-12-09 NOTE — Telephone Encounter (Signed)
That is fine with me. Please call her to schedule.

## 2019-12-09 NOTE — Telephone Encounter (Signed)
That is fine with me, thank you!

## 2019-12-13 ENCOUNTER — Encounter: Payer: Self-pay | Admitting: Family Medicine

## 2019-12-13 NOTE — Telephone Encounter (Signed)
Called patient and scheduled TOC.  °

## 2019-12-15 DIAGNOSIS — I129 Hypertensive chronic kidney disease with stage 1 through stage 4 chronic kidney disease, or unspecified chronic kidney disease: Secondary | ICD-10-CM | POA: Diagnosis not present

## 2019-12-15 DIAGNOSIS — N184 Chronic kidney disease, stage 4 (severe): Secondary | ICD-10-CM | POA: Diagnosis not present

## 2019-12-15 DIAGNOSIS — I509 Heart failure, unspecified: Secondary | ICD-10-CM | POA: Diagnosis not present

## 2019-12-17 NOTE — Telephone Encounter (Signed)
I'll make room for her either Wed. Or Fri. Just block off a 15 minute if you have to use a 15 minute slot so the schedule isn't too compressed.

## 2019-12-22 ENCOUNTER — Other Ambulatory Visit: Payer: Self-pay | Admitting: Cardiology

## 2019-12-23 ENCOUNTER — Ambulatory Visit (INDEPENDENT_AMBULATORY_CARE_PROVIDER_SITE_OTHER): Payer: Medicare HMO | Admitting: Family Medicine

## 2019-12-23 ENCOUNTER — Ambulatory Visit (INDEPENDENT_AMBULATORY_CARE_PROVIDER_SITE_OTHER)
Admission: RE | Admit: 2019-12-23 | Discharge: 2019-12-23 | Disposition: A | Discharge status: Home / Self Care | Payer: Medicare HMO | Source: Ambulatory Visit | Attending: Family Medicine | Admitting: Family Medicine

## 2019-12-23 ENCOUNTER — Ambulatory Visit: Payer: Medicare HMO | Admitting: Cardiology

## 2019-12-23 ENCOUNTER — Encounter: Payer: Self-pay | Admitting: Family Medicine

## 2019-12-23 ENCOUNTER — Other Ambulatory Visit: Payer: Self-pay

## 2019-12-23 VITALS — BP 126/78 | HR 74 | Temp 97.7°F | Ht 64.5 in | Wt 149.8 lb

## 2019-12-23 DIAGNOSIS — M1711 Unilateral primary osteoarthritis, right knee: Secondary | ICD-10-CM | POA: Diagnosis not present

## 2019-12-23 DIAGNOSIS — M25561 Pain in right knee: Secondary | ICD-10-CM

## 2019-12-23 DIAGNOSIS — F329 Major depressive disorder, single episode, unspecified: Secondary | ICD-10-CM

## 2019-12-23 DIAGNOSIS — Z7689 Persons encountering health services in other specified circumstances: Secondary | ICD-10-CM

## 2019-12-23 DIAGNOSIS — I639 Cerebral infarction, unspecified: Secondary | ICD-10-CM | POA: Diagnosis not present

## 2019-12-23 DIAGNOSIS — G8929 Other chronic pain: Secondary | ICD-10-CM

## 2019-12-23 DIAGNOSIS — Z471 Aftercare following joint replacement surgery: Secondary | ICD-10-CM | POA: Diagnosis not present

## 2019-12-23 DIAGNOSIS — Z96651 Presence of right artificial knee joint: Secondary | ICD-10-CM | POA: Diagnosis not present

## 2019-12-23 DIAGNOSIS — E538 Deficiency of other specified B group vitamins: Secondary | ICD-10-CM

## 2019-12-23 DIAGNOSIS — R69 Illness, unspecified: Secondary | ICD-10-CM | POA: Diagnosis not present

## 2019-12-23 DIAGNOSIS — E559 Vitamin D deficiency, unspecified: Secondary | ICD-10-CM

## 2019-12-23 DIAGNOSIS — I6381 Other cerebral infarction due to occlusion or stenosis of small artery: Secondary | ICD-10-CM

## 2019-12-23 LAB — VITAMIN D 25 HYDROXY (VIT D DEFICIENCY, FRACTURES): VITD: 10.24 ng/mL — ABNORMAL LOW (ref 30.00–100.00)

## 2019-12-23 LAB — VITAMIN B12: Vitamin B-12: 218 pg/mL (ref 211–911)

## 2019-12-23 NOTE — Patient Instructions (Signed)
Good to see you today  Please go to lab and xray  Schedule follow up for complete physical in 3 months

## 2019-12-23 NOTE — Progress Notes (Signed)
Subjective:    Patient ID: [name], female    DOB: [birth year], 69 y.o.   MRN: [id number]  HPI Chief Complaint  Patient presents with  . New Patient (Initial Visit)    TOC [name] - Estab care. Discuss B12 and labs / Cardiology recommended depression meds / Referral to PT   This is a 69 yo female who presents today for transfer of care. She is accompanied by her daughter, who is my patient. Lives with her husband. 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   Right knee pain, history of replacement 4-5 years ago [name]), bothers her with certain movements.   CHF with AICD- daily weights with fluid restriction, compliant. No swelling. Following closely with cardiology.   CVA-right basal ganglia, earlier this year, February.  Some residual left-sided weakness.  Needs referral to restart physical therapy.  Able to walk unassisted and do ADLs.  Has noticed increase emotional lability and tires more quickly.  Depression- has tried many medications in past 2012 [name]).  She has a list of medications she has been on at home and her daughter will send me a MyChart message regarding these.  She expresses frustration with her inability to do the things she wants to do because of fatigue.  When she quit work last fall, she thought she would be able to do all of her household things that she had dreamed of doing.  This has been difficult as she gets tired easily.  She is sleeping well, 9 to 10 hours a night.  Review of Systems Sees retina specialist, no chest pain, occasional SOB with exertion, no abdominal pain, occasional diarrhea with certain foods, altered taste, getting better.  Chronic urinary incontinence, stress.  No nosebleeds, hematuria, and blood in bowel movements.    Objective:   Physical Exam Vitals reviewed.  Constitutional:      Appearance: Normal appearance. She is normal weight.  HENT:     Head: Normocephalic and atraumatic.  Eyes:      Conjunctiva/sclera: Conjunctivae normal.  Cardiovascular:     Rate and Rhythm: Normal rate and regular rhythm.     Heart sounds: Normal heart sounds.  Pulmonary:     Effort: Pulmonary effort is normal.     Breath sounds: Normal breath sounds.  Musculoskeletal:     Right lower leg: No edema.     Left lower leg: No edema.  Skin:    General: Skin is warm and dry.     Findings: Bruising (bilateral hands) present.  Neurological:     Mental Status: She is alert and oriented to person, place, and time.  Psychiatric:        Behavior: Behavior normal.        Thought Content: Thought content normal.        Judgment: Judgment normal.     Comments: Tearful during visit.       BP 126/78 (BP Location: Left Arm, Patient Position: Sitting, Cuff Size: Normal)   Pulse 74   Temp 97.7 F (36.5 C) (Temporal)   Ht 5' 4.5" (1.638 m)   Wt 149 lb 12.8 oz (67.9 kg)   SpO2 98%   BMI 25.32 kg/m  Wt Readings from Last 3 Encounters:  12/23/19 149 lb 12.8 oz (67.9 kg)  12/08/19 150 lb 12.8 oz (68.4 kg)  11/29/19 150 lb (68 kg)   Depression screen [facility] 2/9 12/23/2019 01/21/2017 09/10/2016  Decreased Interest 2 0 0  Down, Depressed, Hopeless  3 0 0  PHQ - 2 Score 5 0 0  Altered sleeping 1 - -  Tired, decreased energy 2 - -  Change in appetite 3 - -  Feeling bad or failure about yourself  0 - -  Trouble concentrating 2 - -  Moving slowly or fidgety/restless 0 - -  Suicidal thoughts 0 - -  PHQ-9 Score 13 - -  Difficult doing work/chores Somewhat difficult - -  Some recent data might be hidden       Assessment & Plan:  1. Encounter to establish care -Reviewed available EMR  2. Vitamin B12 deficiency - Vitamin B12  3. Vitamin D deficiency - VITAMIN D 25 Hydroxy (Vit-D Deficiency, Fractures)  4. Chronic pain of right knee -Will check x-ray but I suspect she will need follow-up with orthopedics.  Will also be restarting her physical therapy which may help. - DG Knee Complete 4 Views Right;  Future  5. Cerebrovascular accident (CVA) of right basal ganglia ([name]) -Discussed expectations of recovery with patient including decreased energy, emotional lability.  Needs follow-up per last neuro note - Ambulatory referral to Physical Therapy  6. Primary osteoarthritis of right knee - Ambulatory referral to Physical Therapy  7. Reactive depression -She has been on medications in the past, some of which she did not tolerate.  Her daughter will get her list of things she has tried and send it to me via [place].  Will review and see if there is something we can start her on.  -Follow-up in 3 months for complete physical  This visit occurred during the SARS-CoV-2 public health emergency.  Safety protocols were in place, including screening questions prior to the visit, additional usage of staff PPE, and extensive cleaning of exam room while observing appropriate contact time as indicated for disinfecting solutions.    Janice Reamer, FNP-BC  [name]  12/23/2019 1:30 PM

## 2019-12-24 ENCOUNTER — Other Ambulatory Visit: Payer: Self-pay | Admitting: Cardiology

## 2019-12-24 DIAGNOSIS — I5023 Acute on chronic systolic (congestive) heart failure: Secondary | ICD-10-CM

## 2019-12-27 ENCOUNTER — Other Ambulatory Visit: Payer: Self-pay | Admitting: Family Medicine

## 2019-12-27 DIAGNOSIS — F329 Major depressive disorder, single episode, unspecified: Secondary | ICD-10-CM

## 2019-12-27 MED ORDER — SERTRALINE HCL 50 MG PO TABS: 50.0000 mg | ORAL_TABLET | Freq: Every day | ORAL | 5 refills | Status: DC

## 2020-01-04 ENCOUNTER — Ambulatory Visit: Payer: Medicare HMO | Attending: Family Medicine

## 2020-01-04 ENCOUNTER — Other Ambulatory Visit: Payer: Self-pay

## 2020-01-04 DIAGNOSIS — M6281 Muscle weakness (generalized): Secondary | ICD-10-CM | POA: Diagnosis present

## 2020-01-04 DIAGNOSIS — R2681 Unsteadiness on feet: Secondary | ICD-10-CM | POA: Insufficient documentation

## 2020-01-04 DIAGNOSIS — R2689 Other abnormalities of gait and mobility: Secondary | ICD-10-CM

## 2020-01-04 NOTE — Therapy (Signed)
[facility] [address] [place], [place], [address] Phone: [phone]   Fax:  [phone]  Physical Therapy Evaluation  Patient Details  Name: [name] MRN: [id number] Date of Birth: 1950-10-21 Referring Provider (PT): Clarene Reamer, FNP   Encounter Date: 01/04/2020   PT End of Session - 01/04/20 1405    Visit Number 1    Number of Visits 13    Date for PT Re-Evaluation 04/03/20   POC for 6 weeks, Cert for 90 days   Authorization Type [employer] Medicare (10th Visit PN)    Progress Note Due on Visit 10    PT Start Time 1315    PT Stop Time 1359    PT Time Calculation (min) 44 min    Equipment Utilized During Treatment Gait belt    Activity Tolerance Patient tolerated treatment well    Behavior During Therapy [facility] for tasks assessed/performed           Past Medical History:  Diagnosis Date  . Allergy   . Anxiety   . Arnold-Chiari malformation ([place])   . Arthritis   . CHF (congestive heart failure) ([place])   . Chronic systolic heart failure ([place]) 11/03/2019  . Coronary artery disease   . Encounter for assessment of implantable cardioverter-defibrillator (ICD) 11/10/2019  . GERD (gastroesophageal reflux disease)   . H. pylori infection    3 years ago  . H/O cardiac arrest    Hx of VT/Vfib arrest  . Hyperlipidemia   . ICD BiV Medtronic model [id number] Claria Quad CRT-D SureScan ICD 08/25/2019   . Incontinence   . Paroxysmal atrial fibrillation ([place]) 08/06/2019  . Syncope and collapse    Per pt, denies passing out  . Tobacco use disorder   . Unspecified essential hypertension     Past Surgical History:  Procedure Laterality Date  . APPENDECTOMY    . ARNOLD CHIARI SURGERY     neurocranial surgery  . BIV ICD INSERTION CRT-D N/A 08/25/2019   Procedure: BIV ICD INSERTION CRT-D;  Surgeon: Constance Haw, MD;  Location: [facility];  Service: Cardiovascular;  Laterality: N/A;  . BLEPHAROPLASTY      Bil  . C-EYE SURGERY PROCEDURE    . CARDIAC CATHETERIZATION    . CHOLECYSTECTOMY    . EYE SURGERY    . heart failure    . LEFT HEART CATH AND CORONARY ANGIOGRAPHY N/A 08/04/2019   Procedure: LEFT HEART CATH AND CORONARY ANGIOGRAPHY;  Surgeon: Adrian Prows, MD;  Location: [facility];  Service: Cardiovascular;  Laterality: N/A;  . MASS EXCISION Right 12/27/2013   Procedure: MINOR EXCISION OF RIGHT THUMB MUCOID CYST, DEBRIDEMENT OF INTERPHALANGEAL JOINT;  Surgeon: Cammie Sickle, MD;  Location: [facility];  Service: Orthopedics;  Laterality: Right;  . mass on thumb  right  . TOTAL KNEE ARTHROPLASTY Right 02/17/2017  . TOTAL KNEE ARTHROPLASTY Right 02/17/2017   Procedure: TOTAL KNEE ARTHROPLASTY;  Surgeon: Melrose Nakayama, MD;  Location: [facility];  Service: Orthopedics;  Laterality: Right;  . TUBAL LIGATION      There were no vitals filed for this visit.    Subjective Assessment - 01/04/20 1318    Subjective Patient reports that she had an X-Ray on the R Knee and it come back normal. Reports that she has been really fatigued within the last weeks, believes it was due to medication changes. Patient reports she is gradually trying to start doing things during the day  and building up her endurance. Patient currently drinking lots of water, and has tried to start eating more to get adequate food intake. Reports she is having headahces (believes due to sinuses)    Patient is accompained by: Family member   Daughter   Pertinent History PMH includes anxiety, HTN, Chiari I malformation, a fib, CKD stage III, HLD, hx of R knee replacement, hx of sciatica for several years, Acute Ischemic CVA    Limitations Walking;Standing    Patient Stated Goals Improve strength/endurance    Currently in Pain? No/denies              [facility] PT Assessment - 01/04/20 0001      Assessment   Medical Diagnosis General Weakness/Fatigue - Post CVA    Referring Provider (PT) Clarene Reamer, FNP     Onset Date/Surgical Date 12/23/19   referral date   Hand Dominance Right    Next MD Visit --   September 2021   Prior Therapy Prior Therapy at this Clinic (April 2021)       Precautions   Precautions Fall;ICD/Pacemaker    Precaution Comments Patient has Pacemaker       Balance Screen   Has the patient fallen in the past 6 months No    Has the patient had a decrease in activity level because of a fear of falling?  No    Is the patient reluctant to leave their home because of a fear of falling?  No      Home Environment   Living Environment Private residence    Living Arrangements Spouse/significant other    Available Help at Discharge Family    Type of [place] to enter    Entrance Stairs-Number of Steps 2    Entrance Stairs-Rails Right;Left;Cannot reach both    [place] One level   one step up going into main bedroom   Accident - standard;Grab bars - toilet;Grab bars - tub/shower;Shower seat      Prior Function   Level of Independence Independent with household mobility without device;Independent with community mobility without device    Vocation Retired      Charity fundraiser Status Within Functional Limits for tasks assessed      Observation/Other Assessments   Focus on Therapeutic Outcomes (FOTO)  Not Applicable      Sensation   Light Touch Appears Intact      Posture/Postural Control   Posture/Postural Control Postural limitations    Postural Limitations Rounded Shoulders;Forward head      ROM / Strength   AROM / PROM / Strength AROM;Strength      AROM   Overall AROM  Deficits    Overall AROM Comments due to strength, no formal measurements assessed today      Strength   Overall Strength Deficits    Overall Strength Comments all completed from seated position    Strength Assessment Site Hip;Knee;Ankle    Right/Left Hip Right;Left    Right Hip Flexion 4-/5    Right Hip ABduction 4-/5    Right Hip ADduction  4-/5    Left Hip Flexion 3+/5    Left Hip ABduction 4/5    Left Hip ADduction 4-/5    Right/Left Knee Right;Left    Right Knee Flexion 4-/5    Right Knee Extension 4-/5    Left Knee Flexion 4-/5    Left Knee Extension 4-/5    Right/Left Ankle Right;Left  Right Ankle Dorsiflexion 3+/5    Left Ankle Dorsiflexion 3/5      Transfers   Transfers Sit to Stand;Stand to Sit    Sit to Stand 5: Supervision    Five time sit to stand comments  22.03 secs completed w/o UE support from standard height chair    Stand to Sit 5: Supervision      Ambulation/Gait   Ambulation/Gait Yes    Ambulation/Gait Assistance 5: Supervision    Ambulation/Gait Assistance Details supervision for safety with completion of 6MWT    Assistive device None    Gait Pattern Step-through pattern    Ambulation Surface Level;Indoor    Gait velocity 9.94 secs = 3.29 ft/sec      6 Minute Walk- Baseline   6 Minute Walk- Baseline yes    BP (mmHg) 136/84    Modified Borg Scale for Dyspnea 0- Nothing at all      6 Minute walk- Post Test   6 Minute Walk Post Test yes    Modified Borg Scale for Dyspnea 3- Moderate shortness of breath or breathing difficulty    Perceived Rate of Exertion (Borg) 13- Somewhat hard      6 minute walk test results    Aerobic Endurance Distance Walked 851    Endurance additional comments slow gait speed throughout, no AD use with completion. Patient able to continue conversation throughout completion of 6MWT                      Objective measurements completed on examination: See above findings.                 PT Short Term Goals - 01/04/20 1627      PT SHORT TERM GOAL #1   Title Pt will be independent with initial HEP to improve balance/BLE strength    Baseline no HEP at this time    Time 3    Period Weeks    Status New    Target Date 01/25/20      PT SHORT TERM GOAL #2   Title Patient will improve 5x sit <> stand to </= 20 seconds to demonstrate  improved functional strength    Baseline 22.03    Time 3    Period Weeks    Status New    Target Date 01/25/20             PT Long Term Goals - 01/04/20 1629      PT LONG TERM GOAL #1   Title Pt will be independent with final HEP for BLE strenghtening/balance ( ALL LTGS DUE 02/15/2020)    Baseline no HEP at this time    Time 6    Period Weeks    Status New    Target Date 02/15/20      PT LONG TERM GOAL #2   Title Patient will improve FGA by 5 points from baseline to demo decreased fall risk    Baseline not yet assessed    Time 6    Period Weeks    Status New    Target Date 02/15/20      PT LONG TERM GOAL #3   Title Patient will ambulate 1,200 ft in 6 minutes w/o AD to demo improved endurance with walking    Baseline 851    Time 6    Period Weeks    Status New      PT LONG TERM GOAL #4   Title Patient will demo  improved 5x sit <> stand to </= 15 seconds without UE support for improved functional BLE strength    Baseline 22.03    Time 6    Period Weeks    Status New                  Plan - 01/04/20 1406    Clinical Impression Statement Patient is a 69 y.o. female that was referred to Neuro OPPT services for general weakness due to prior CVA. Patient's PMH is signficant for the following: anxiety, HTN, Chiari I malformation, a fib, CKD stage III, HLD, hx of R knee replacement, hx of sciatica for several years, Acute Ischemic CVA. Upon evaluation, patient demonstrates the following impairments: decreased endurance, decreased strength, decreased functional mobility, and increased risk for falls. Patient ambulating 851 ft with 6MWT, demonstrating decreased endurance in comparison to status at prior discharge. Patient will benefit from skilled PT services to improve functional mobility, activity tolerance, and reduce risk for falls.    Personal Factors and Comorbidities Comorbidity 3+    Comorbidities anxiety, HTN, Chiari I malformation, a fib, CKD stage III, HLD, hx  of R knee replacement, hx of sciatica for several years, Acute Ischemic CVA    Examination-Activity Limitations Squat;Stairs;Stand    Examination-Participation Restrictions Community Activity;Yard Work;Driving    Stability/Clinical Decision Making Evolving/Moderate complexity    Clinical Decision Making Moderate    Rehab Potential Good    PT Frequency 2x / week    PT Duration 6 weeks    PT Treatment/Interventions ADLs/Self Care Home Management;Cryotherapy;Electrical Stimulation;Moist Heat;DME Instruction;Gait training;Stair training;Functional mobility training;Therapeutic activities;Therapeutic exercise;Balance training;Neuromuscular re-education;Patient/family education;Manual techniques;Passive range of motion;Energy conservation    PT Next Visit Plan Initiate HEP (strenghtening), FGA assessment    Consulted and Agree with Plan of Care Patient;Family member/caregiver    Family Member Consulted Daughter           Patient will benefit from skilled therapeutic intervention in order to improve the following deficits and impairments:  Abnormal gait, Decreased balance, Decreased endurance, Decreased range of motion, Decreased activity tolerance, Decreased strength, Difficulty walking, Pain, Postural dysfunction  Visit Diagnosis: Muscle weakness (generalized)  Unsteadiness on feet  Other abnormalities of gait and mobility     Problem List Patient Active Problem List   Diagnosis Date Noted  . Encounter for assessment of implantable cardioverter-defibrillator (ICD) 11/10/2019  . H/O cardiac arrest 08/03/2019 11/10/2019  . Coronary artery disease involving native coronary artery of native heart without angina pectoris 11/03/2019  . Benign hypertensive CKD, stage 1-4 or unspecified chronic kidney disease 11/03/2019  . Chronic systolic heart failure ([place]) 11/03/2019  . Lightheadedness 11/03/2019  . CHF (congestive heart failure) ([place]) 10/18/2019  . Pruritus 09/01/2019  . ICD BiV  Medtronic model [id number] Claria Quad CRT-D SureScan ICD 08/25/2019   . Cardiac arrest with ventricular fibrillation ([place]) 08/24/2019  . TIA (transient ischemic attack) 08/15/2019  . Long term current use of anticoagulant 08/14/2019  . Cardiomyopathy ([place]) 08/14/2019  . Paroxysmal atrial fibrillation ([place]) 08/06/2019  . Statin intolerance 08/03/2019  . Dysphagia   . History of CVA (cerebrovascular accident) 08/02/2019  . Hypokalemia 08/02/2019  . Left bundle branch block 08/02/2019  . CKD (chronic kidney disease) stage 3, GFR 30-59 ml/min 12/13/2018  . Primary localized osteoarthritis of right knee 02/17/2017  . Primary osteoarthritis of right knee 02/17/2017  . Preventative health care 09/01/2016  . Vitamin B12 deficiency 09/10/2015  . Insomnia 07/16/2015  . GERD (gastroesophageal reflux disease) 03/26/2015  . Fatigue 10/02/2014  .  Other malaise and fatigue 02/08/2014  . HLD (hyperlipidemia) 11/19/2011  . Vaginal dryness, menopausal 11/19/2011  . Adjustment disorder 01/08/2011  . Former smoker 12/19/2008  . Essential hypertension 12/19/2008    Jones Bales, PT, DPT 01/04/2020, 4:31 PM  [facility] [address] [place], [place], [address] Phone: [phone]   Fax:  [phone]  Name: [name] MRN: [id number] Date of Birth: November 19, 1950

## 2020-01-09 ENCOUNTER — Ambulatory Visit: Payer: Medicare HMO

## 2020-01-09 ENCOUNTER — Other Ambulatory Visit: Payer: Self-pay

## 2020-01-09 DIAGNOSIS — M6281 Muscle weakness (generalized): Secondary | ICD-10-CM

## 2020-01-09 DIAGNOSIS — R2681 Unsteadiness on feet: Secondary | ICD-10-CM | POA: Diagnosis not present

## 2020-01-09 DIAGNOSIS — R2689 Other abnormalities of gait and mobility: Secondary | ICD-10-CM

## 2020-01-09 NOTE — Therapy (Signed)
[facility] [address] [place], [place], [address] Phone: [phone]   Fax:  [phone]  Physical Therapy Treatment  Patient Details  Name: [name] MRN: [id number] Date of Birth: 1951/05/29 Referring Provider (PT): Clarene Reamer, FNP   Encounter Date: 01/09/2020   PT End of Session - 01/09/20 1107    Visit Number 2    Number of Visits 13    Date for PT Re-Evaluation 04/03/20   POC for 6 weeks, Cert for 90 days   Authorization Type [employer]na Medicare (10th Visit PN)    Progress Note Due on Visit 10    PT Start Time 1103    PT Stop Time 1144    PT Time Calculation (min) 41 min    Equipment Utilized During Treatment Gait belt    Activity Tolerance Patient tolerated treatment well    Behavior During Therapy [facility] for tasks assessed/performed           Past Medical History:  Diagnosis Date  . Allergy   . Anxiety   . Arnold-Chiari malformation ([place])   . Arthritis   . CHF (congestive heart failure) ([place])   . Chronic systolic heart failure ([place]) 11/03/2019  . Coronary artery disease   . Encounter for assessment of implantable cardioverter-defibrillator (ICD) 11/10/2019  . GERD (gastroesophageal reflux disease)   . H. pylori infection    3 years ago  . H/O cardiac arrest    Hx of VT/Vfib arrest  . Hyperlipidemia   . ICD BiV Medtronic model [id number] Claria Quad CRT-D SureScan ICD 08/25/2019   . Incontinence   . Paroxysmal atrial fibrillation ([place]) 08/06/2019  . Syncope and collapse    Per pt, denies passing out  . Tobacco use disorder   . Unspecified essential hypertension     Past Surgical History:  Procedure Laterality Date  . APPENDECTOMY    . ARNOLD CHIARI SURGERY     neurocranial surgery  . BIV ICD INSERTION CRT-D N/A 08/25/2019   Procedure: BIV ICD INSERTION CRT-D;  Surgeon: Constance Haw, MD;  Location: [facility];  Service: Cardiovascular;  Laterality: N/A;  . BLEPHAROPLASTY      Bil  . C-EYE SURGERY PROCEDURE    . CARDIAC CATHETERIZATION    . CHOLECYSTECTOMY    . EYE SURGERY    . heart failure    . LEFT HEART CATH AND CORONARY ANGIOGRAPHY N/A 08/04/2019   Procedure: LEFT HEART CATH AND CORONARY ANGIOGRAPHY;  Surgeon: Adrian Prows, MD;  Location: [facility];  Service: Cardiovascular;  Laterality: N/A;  . MASS EXCISION Right 12/27/2013   Procedure: MINOR EXCISION OF RIGHT THUMB MUCOID CYST, DEBRIDEMENT OF INTERPHALANGEAL JOINT;  Surgeon: Cammie Sickle, MD;  Location: [facility];  Service: Orthopedics;  Laterality: Right;  . mass on thumb  right  . TOTAL KNEE ARTHROPLASTY Right 02/17/2017  . TOTAL KNEE ARTHROPLASTY Right 02/17/2017   Procedure: TOTAL KNEE ARTHROPLASTY;  Surgeon: [place] Nakayama, MD;  Location: [facility];  Service: Orthopedics;  Laterality: Right;  . TUBAL LIGATION      There were no vitals filed for this visit.   Subjective Assessment - 01/09/20 1106    Subjective Patient reports that she hasnt felt well this morning.    Patient is accompained by: Family member   Daughter   Pertinent History PMH includes anxiety, HTN, Chiari I malformation, a fib, CKD stage III, HLD, hx of R knee replacement, hx of sciatica for several  years, Acute Ischemic CVA    Limitations Walking;Standing    Patient Stated Goals Improve strength/endurance    Currently in Pain? No/denies              [facility] PT Assessment - 01/09/20 0001      Functional Gait  Assessment   Gait assessed  Yes    Gait Level Surface Walks 20 ft, slow speed, abnormal gait pattern, evidence for imbalance or deviates 10-15 in outside of the 12 in walkway width. Requires more than 7 sec to ambulate 20 ft.    Change in Gait Speed Makes only minor adjustments to walking speed, or accomplishes a change in speed with significant gait deviations, deviates 10-15 in outside the 12 in walkway width, or changes speed but loses balance but is able to recover and continue walking.    Gait  with Horizontal Head Turns Performs head turns smoothly with slight change in gait velocity (eg, minor disruption to smooth gait path), deviates 6-10 in outside 12 in walkway width, or uses an assistive device.    Gait with Vertical Head Turns Performs task with slight change in gait velocity (eg, minor disruption to smooth gait path), deviates 6 - 10 in outside 12 in walkway width or uses assistive device    Gait and Pivot Turn Pivot turns safely in greater than 3 sec and stops with no loss of balance, or pivot turns safely within 3 sec and stops with mild imbalance, requires small steps to catch balance.    Step Over Obstacle Is able to step over one shoe box (4.5 in total height) without changing gait speed. No evidence of imbalance.    Gait with Narrow Base of Support Ambulates 4-7 steps.    Gait with Eyes Closed Walks 20 ft, slow speed, abnormal gait pattern, evidence for imbalance, deviates 10-15 in outside 12 in walkway width. Requires more than 9 sec to ambulate 20 ft.    Ambulating Backwards Walks 20 ft, slow speed, abnormal gait pattern, evidence for imbalance, deviates 10-15 in outside 12 in walkway width.    Steps Alternating feet, must use rail.    Total Score 15    FGA comment: 15/30 = High Fall Risk           Completed the following exercises and established Initial HEP:     Access Code: [email or web address] URL: [email or web address] Date: 01/09/2020 Prepared by: Baldomero Lamy  Exercises Sit to Stand - 1 x daily - 7 x weekly - 10 reps - 3 sets Standing Hip Abduction with Counter Support - 1 x daily - 7 x weekly - 10 reps - 2 sets Standing Hip Extension with Counter Support - 1 x daily - 7 x weekly - 10 reps - 2 sets Heel Toe Raises with Counter Support - 1 x daily - 7 x weekly - 3 sets - 10 reps Side Stepping with Resistance at Thighs and Counter Support - 1 x daily - 7 x weekly - 10 reps - 3 sets Backward Walking with Counter Support - 1 x daily - 7 x weekly - 3 sets  - 10 reps  Patient Education walking program - educated on gradually increasing walking time/distance per day to further work on endurance outside of therapy session.              PT Education - 01/09/20 1131    Education Details Educated on Initial HEP and Beginning Walking Program(see patient instructions for details)    Person(s) Educated Patient  Methods Explanation;Demonstration;Handout    Comprehension Verbalized understanding;Returned demonstration;Need further instruction            PT Short Term Goals - 01/04/20 1627      PT SHORT TERM GOAL #1   Title Pt will be independent with initial HEP to improve balance/BLE strength    Baseline no HEP at this time    Time 3    Period Weeks    Status New    Target Date 01/25/20      PT SHORT TERM GOAL #2   Title Patient will improve 5x sit <> stand to </= 20 seconds to demonstrate improved functional strength    Baseline 22.03    Time 3    Period Weeks    Status New    Target Date 01/25/20             PT Long Term Goals - 01/04/20 1629      PT LONG TERM GOAL #1   Title Pt will be independent with final HEP for BLE strenghtening/balance ( ALL LTGS DUE 02/15/2020)    Baseline no HEP at this time    Time 6    Period Weeks    Status New    Target Date 02/15/20      PT LONG TERM GOAL #2   Title Patient will improve FGA by 5 points from baseline to demo decreased fall risk    Baseline not yet assessed    Time 6    Period Weeks    Status New    Target Date 02/15/20      PT LONG TERM GOAL #3   Title Patient will ambulate 1,200 ft in 6 minutes w/o AD to demo improved endurance with walking    Baseline 851    Time 6    Period Weeks    Status New      PT LONG TERM GOAL #4   Title Patient will demo improved 5x sit <> stand to </= 15 seconds without UE support for improved functional BLE strength    Baseline 22.03    Time 6    Period Weeks    Status New                 Plan - 01/09/20 1150     Clinical Impression Statement Today's skilled session included further assessment with completion of FGA, patient scoring a 15/30 demonstrating high fall risk. Rest of session spent establishing initial HEP and educating on beginning walking program to further improve endurance. Patient will continue to benefit from skilled PT services to progress toward goals.    Personal Factors and Comorbidities Comorbidity 3+    Comorbidities anxiety, HTN, Chiari I malformation, a fib, CKD stage III, HLD, hx of R knee replacement, hx of sciatica for several years, Acute Ischemic CVA    Examination-Activity Limitations Squat;Stairs;Stand    Examination-Participation Restrictions Community Activity;Yard Work;Driving    Stability/Clinical Decision Making Evolving/Moderate complexity    Rehab Potential Good    PT Frequency 2x / week    PT Duration 6 weeks    PT Treatment/Interventions ADLs/Self Care Home Management;Cryotherapy;Electrical Stimulation;Moist Heat;DME Instruction;Gait training;Stair training;Functional mobility training;Therapeutic activities;Therapeutic exercise;Balance training;Neuromuscular re-education;Patient/family education;Manual techniques;Passive range of motion;Energy conservation    PT Next Visit Plan How was HEP? Continues standing strengthening activites, endurance activites (scifit/nustep)    Consulted and Agree with Plan of Care Patient;Family member/caregiver    Family Member Consulted Daughter  Patient will benefit from skilled therapeutic intervention in order to improve the following deficits and impairments:  Abnormal gait, Decreased balance, Decreased endurance, Decreased range of motion, Decreased activity tolerance, Decreased strength, Difficulty walking, Pain, Postural dysfunction  Visit Diagnosis: Muscle weakness (generalized)  Unsteadiness on feet  Other abnormalities of gait and mobility     Problem List Patient Active Problem List   Diagnosis Date  Noted  . Encounter for assessment of implantable cardioverter-defibrillator (ICD) 11/10/2019  . H/O cardiac arrest 08/03/2019 11/10/2019  . Coronary artery disease involving native coronary artery of native heart without angina pectoris 11/03/2019  . Benign hypertensive CKD, stage 1-4 or unspecified chronic kidney disease 11/03/2019  . Chronic systolic heart failure ([place]) 11/03/2019  . Lightheadedness 11/03/2019  . CHF (congestive heart failure) ([place]) 10/18/2019  . Pruritus 09/01/2019  . ICD BiV Medtronic model [id number] Claria Quad CRT-D SureScan ICD 08/25/2019   . Cardiac arrest with ventricular fibrillation ([place]) 08/24/2019  . TIA (transient ischemic attack) 08/15/2019  . Long term current use of anticoagulant 08/14/2019  . Cardiomyopathy ([place]) 08/14/2019  . Paroxysmal atrial fibrillation ([place]) 08/06/2019  . Statin intolerance 08/03/2019  . Dysphagia   . History of CVA (cerebrovascular accident) 08/02/2019  . Hypokalemia 08/02/2019  . Left bundle branch block 08/02/2019  . CKD (chronic kidney disease) stage 3, GFR 30-59 ml/min 12/13/2018  . Primary localized osteoarthritis of right knee 02/17/2017  . Primary osteoarthritis of right knee 02/17/2017  . Preventative health care 09/01/2016  . Vitamin B12 deficiency 09/10/2015  . Insomnia 07/16/2015  . GERD (gastroesophageal reflux disease) 03/26/2015  . Fatigue 10/02/2014  . Other malaise and fatigue 02/08/2014  . HLD (hyperlipidemia) 11/19/2011  . Vaginal dryness, menopausal 11/19/2011  . Adjustment disorder 01/08/2011  . Former smoker 12/19/2008  . Essential hypertension 12/19/2008    Jones Bales, PT, DPT 01/09/2020, 11:53 AM  [facility] [address] [place], [place], [address] Phone: [phone]   Fax:  [phone]  Name: [name] MRN: [id number] Date of Birth: 16-Feb-1951

## 2020-01-09 NOTE — Patient Instructions (Signed)
Access Code: [email or web address] URL: [email or web address] Date: 01/09/2020 Prepared by: Baldomero Lamy  Exercises Sit to Stand - 1 x daily - 7 x weekly - 10 reps - 3 sets Standing Hip Abduction with Counter Support - 1 x daily - 7 x weekly - 10 reps - 2 sets Standing Hip Extension with Counter Support - 1 x daily - 7 x weekly - 10 reps - 2 sets Heel Toe Raises with Counter Support - 1 x daily - 7 x weekly - 3 sets - 10 reps Side Stepping with Resistance at Thighs and Counter Support - 1 x daily - 7 x weekly - 10 reps - 3 sets Backward Walking with Counter Support - 1 x daily - 7 x weekly - 3 sets - 10 reps  Patient Education walking program

## 2020-01-11 ENCOUNTER — Telehealth: Payer: Self-pay

## 2020-01-11 NOTE — Telephone Encounter (Signed)
That should be fine. 

## 2020-01-11 NOTE — Telephone Encounter (Signed)
Received a call from [place], patient's daughter in regards to patient having severe diarrhea during the week. Daughter would like to know if it is safe for patient to take medication to improve symptoms such as pepto bismol. Please advise. Thanks!

## 2020-01-12 ENCOUNTER — Ambulatory Visit: Payer: Medicare HMO | Admitting: Physical Therapy

## 2020-01-12 ENCOUNTER — Other Ambulatory Visit: Payer: Self-pay

## 2020-01-12 VITALS — BP 152/67 | HR 82

## 2020-01-12 DIAGNOSIS — R2689 Other abnormalities of gait and mobility: Secondary | ICD-10-CM

## 2020-01-12 DIAGNOSIS — M6281 Muscle weakness (generalized): Secondary | ICD-10-CM | POA: Diagnosis not present

## 2020-01-12 DIAGNOSIS — R2681 Unsteadiness on feet: Secondary | ICD-10-CM

## 2020-01-12 NOTE — Therapy (Signed)
[facility] [address] [place], [place], [address] Phone: [phone]   Fax:  [phone]  Physical Therapy Treatment  Patient Details  Name: [name] MRN: [id number] Date of Birth: 1950-10-10 Referring Provider (PT): Clarene Reamer, FNP   Encounter Date: 01/12/2020   PT End of Session - 01/12/20 1100    Visit Number 3    Number of Visits 13    Date for PT Re-Evaluation 04/03/20   POC for 6 weeks, Cert for 90 days   Authorization Type [employer] Medicare (10th Visit PN)    Progress Note Due on Visit 10    PT Start Time 1018    PT Stop Time 1058    PT Time Calculation (min) 40 min    Equipment Utilized During Treatment Gait belt    Activity Tolerance Patient tolerated treatment well    Behavior During Therapy [facility] for tasks assessed/performed;Flat affect           Past Medical History:  Diagnosis Date  . Allergy   . Anxiety   . Arnold-Chiari malformation ([place])   . Arthritis   . CHF (congestive heart failure) ([place])   . Chronic systolic heart failure ([place]) 11/03/2019  . Coronary artery disease   . Encounter for assessment of implantable cardioverter-defibrillator (ICD) 11/10/2019  . GERD (gastroesophageal reflux disease)   . H. pylori infection    3 years ago  . H/O cardiac arrest    Hx of VT/Vfib arrest  . Hyperlipidemia   . ICD BiV Medtronic model [id number] Claria Quad CRT-D SureScan ICD 08/25/2019   . Incontinence   . Paroxysmal atrial fibrillation ([place]) 08/06/2019  . Syncope and collapse    Per pt, denies passing out  . Tobacco use disorder   . Unspecified essential hypertension     Past Surgical History:  Procedure Laterality Date  . APPENDECTOMY    . ARNOLD CHIARI SURGERY     neurocranial surgery  . BIV ICD INSERTION CRT-D N/A 08/25/2019   Procedure: BIV ICD INSERTION CRT-D;  Surgeon: Constance Haw, MD;  Location: [facility];  Service: Cardiovascular;  Laterality: N/A;  .  BLEPHAROPLASTY     Bil  . C-EYE SURGERY PROCEDURE    . CARDIAC CATHETERIZATION    . CHOLECYSTECTOMY    . EYE SURGERY    . heart failure    . LEFT HEART CATH AND CORONARY ANGIOGRAPHY N/A 08/04/2019   Procedure: LEFT HEART CATH AND CORONARY ANGIOGRAPHY;  Surgeon: Adrian Prows, MD;  Location: [facility];  Service: Cardiovascular;  Laterality: N/A;  . MASS EXCISION Right 12/27/2013   Procedure: MINOR EXCISION OF RIGHT THUMB MUCOID CYST, DEBRIDEMENT OF INTERPHALANGEAL JOINT;  Surgeon: Cammie Sickle, MD;  Location: [facility];  Service: Orthopedics;  Laterality: Right;  . mass on thumb  right  . TOTAL KNEE ARTHROPLASTY Right 02/17/2017  . TOTAL KNEE ARTHROPLASTY Right 02/17/2017   Procedure: TOTAL KNEE ARTHROPLASTY;  Surgeon: Melrose Nakayama, MD;  Location: [facility];  Service: Orthopedics;  Laterality: Right;  . TUBAL LIGATION      Vitals:   01/12/20 1022  BP: (!) 152/67  Pulse: 82     Subjective Assessment - 01/12/20 1021    Subjective Stomach is not feeling well - thinks it is because of the new medication she is taking. Also reports having a sinus headache. Has tried the exercises at home.    Patient is accompained by: Family member   Daughter  Pertinent History PMH includes anxiety, HTN, Chiari I malformation, a fib, CKD stage III, HLD, hx of R knee replacement, hx of sciatica for several years, Acute Ischemic CVA    Limitations Walking;Standing    Patient Stated Goals Improve strength/endurance    Currently in Pain? Yes    Pain Score 4     Pain Location Head   sinus headache   Pain Orientation --   across forehead   Pain Descriptors / Indicators Dull    Aggravating Factors  "i dont think anything"    Pain Relieving Factors sleep                             [facility] Adult PT Treatment/Exercise - 01/12/20 0001      Transfers   Transfers Sit to Stand;Stand to Sit    Sit to Stand 4: Min guard    Stand to Sit 4: Min guard    Comments from mat  table with no UE support on blue foam beam x10 reps              Balance Exercises - 01/12/20 0001      Balance Exercises: Standing   Tandem Stance Eyes open;Foam/compliant surface;Intermittent upper extremity support;20 secs;Limitations   foam balance beam   Tandem Stance Time x5 reps for 20 seconds with LLE posteriorly with intermittent UE support on bars for balance, x3 reps of 20 seconds with RLE posteriorly    SLS Eyes open;Foam/compliant surface;Limitations    SLS Limitations on blue mat, alternating x5 reps B tapping forward cone and then cross body cone, cues for slowed and controlled, then additional x5 reps with LLE only    Stepping Strategy Anterior;Posterior;Foam/compliant surface;10 reps;Limitations    Stepping Strategy Limitations alternating x10 reps each on blue foam beam, cues for incr foot clearance    Step Ups Forward;6 inch;Limitations    Step Ups Limitations no UE support with LLE, cues to float non stance RLE, attempted lateral step ups but pt reporting incr pain when performing     Tandem Gait Upper extremity support;Forward;3 reps;Limitations    Tandem Gait Limitations on blue foam beam, cues for slowed and controlled    Sidestepping Foam/compliant support;4 reps;Limitations    Sidestepping Limitations down and back 4 reps on blue foam beam, intermittent UE support, cues for incr foot clearance    Marching Foam/compliant surface;Limitations    Marching Limitations on blue compliant mat outside of // bars: down and back 4 reps slow marching with focus on SLS, intermittent UE support               PT Short Term Goals - 01/04/20 1627      PT SHORT TERM GOAL #1   Title Pt will be independent with initial HEP to improve balance/BLE strength    Baseline no HEP at this time    Time 3    Period Weeks    Status New    Target Date 01/25/20      PT SHORT TERM GOAL #2   Title Patient will improve 5x sit <> stand to </= 20 seconds to demonstrate improved  functional strength    Baseline 22.03    Time 3    Period Weeks    Status New    Target Date 01/25/20             PT Long Term Goals - 01/04/20 1629      PT LONG  TERM GOAL #1   Title Pt will be independent with final HEP for BLE strenghtening/balance ( ALL LTGS DUE 02/15/2020)    Baseline no HEP at this time    Time 6    Period Weeks    Status New    Target Date 02/15/20      PT LONG TERM GOAL #2   Title Patient will improve FGA by 5 points from baseline to demo decreased fall risk    Baseline not yet assessed    Time 6    Period Weeks    Status New    Target Date 02/15/20      PT LONG TERM GOAL #3   Title Patient will ambulate 1,200 ft in 6 minutes w/o AD to demo improved endurance with walking    Baseline 851    Time 6    Period Weeks    Status New      PT LONG TERM GOAL #4   Title Patient will demo improved 5x sit <> stand to </= 15 seconds without UE support for improved functional BLE strength    Baseline 22.03    Time 6    Period Weeks    Status New                 Plan - 01/12/20 1101    Clinical Impression Statement Today's skilled session continued to focus on BLE strengthening and standing balance strategies on compliant surfaces. Rest breaks taken as needed throughout session. Pt with incr difficulty with SLS activities on LLE, esp when on a compliant surface. Will continue to progress towards LTGs.    Personal Factors and Comorbidities Comorbidity 3+    Comorbidities anxiety, HTN, Chiari I malformation, a fib, CKD stage III, HLD, hx of R knee replacement, hx of sciatica for several years, Acute Ischemic CVA    Examination-Activity Limitations Squat;Stairs;Stand    Examination-Participation Restrictions Community Activity;Yard Work;Driving    Stability/Clinical Decision Making Evolving/Moderate complexity    Rehab Potential Good    PT Frequency 2x / week    PT Duration 6 weeks    PT Treatment/Interventions ADLs/Self Care Home  Management;Cryotherapy;Electrical Stimulation;Moist Heat;DME Instruction;Gait training;Stair training;Functional mobility training;Therapeutic activities;Therapeutic exercise;Balance training;Neuromuscular re-education;Patient/family education;Manual techniques;Passive range of motion;Energy conservation    PT Next Visit Plan how is HEP going? standing balance - LLE SLS. Continues standing strengthening activites, endurance activites (scifit/nustep)    Consulted and Agree with Plan of Care Patient;Family member/caregiver    Family Member Consulted Daughter           Patient will benefit from skilled therapeutic intervention in order to improve the following deficits and impairments:  Abnormal gait, Decreased balance, Decreased endurance, Decreased range of motion, Decreased activity tolerance, Decreased strength, Difficulty walking, Pain, Postural dysfunction  Visit Diagnosis: Unsteadiness on feet  Muscle weakness (generalized)  Other abnormalities of gait and mobility     Problem List Patient Active Problem List   Diagnosis Date Noted  . Encounter for assessment of implantable cardioverter-defibrillator (ICD) 11/10/2019  . H/O cardiac arrest 08/03/2019 11/10/2019  . Coronary artery disease involving native coronary artery of native heart without angina pectoris 11/03/2019  . Benign hypertensive CKD, stage 1-4 or unspecified chronic kidney disease 11/03/2019  . Chronic systolic heart failure ([place]) 11/03/2019  . Lightheadedness 11/03/2019  . CHF (congestive heart failure) ([place]) 10/18/2019  . Pruritus 09/01/2019  . ICD BiV Medtronic model [id number] Claria Quad CRT-D SureScan ICD 08/25/2019   . Cardiac arrest with ventricular fibrillation ([place]) 08/24/2019  .  TIA (transient ischemic attack) 08/15/2019  . Long term current use of anticoagulant 08/14/2019  . Cardiomyopathy ([place]) 08/14/2019  . Paroxysmal atrial fibrillation ([place]) 08/06/2019  . Statin intolerance 08/03/2019  . Dysphagia     . History of CVA (cerebrovascular accident) 08/02/2019  . Hypokalemia 08/02/2019  . Left bundle branch block 08/02/2019  . CKD (chronic kidney disease) stage 3, GFR 30-59 ml/min 12/13/2018  . Primary localized osteoarthritis of right knee 02/17/2017  . Primary osteoarthritis of right knee 02/17/2017  . Preventative health care 09/01/2016  . Vitamin B12 deficiency 09/10/2015  . Insomnia 07/16/2015  . GERD (gastroesophageal reflux disease) 03/26/2015  . Fatigue 10/02/2014  . Other malaise and fatigue 02/08/2014  . HLD (hyperlipidemia) 11/19/2011  . Vaginal dryness, menopausal 11/19/2011  . Adjustment disorder 01/08/2011  . Former smoker 12/19/2008  . Essential hypertension 12/19/2008    Arliss Journey, PT, DPT  01/12/2020, 11:02 AM  [facility] [address] [place], [place], [address] Phone: [phone]   Fax:  [phone]  Name: ASHLE STIEF MRN: [id number] Date of Birth: 1951-02-10

## 2020-01-17 ENCOUNTER — Other Ambulatory Visit: Payer: Self-pay

## 2020-01-17 ENCOUNTER — Ambulatory Visit: Payer: Medicare HMO | Attending: Family Medicine

## 2020-01-17 DIAGNOSIS — R2681 Unsteadiness on feet: Secondary | ICD-10-CM | POA: Diagnosis not present

## 2020-01-17 DIAGNOSIS — R2689 Other abnormalities of gait and mobility: Secondary | ICD-10-CM | POA: Diagnosis not present

## 2020-01-17 DIAGNOSIS — R278 Other lack of coordination: Secondary | ICD-10-CM | POA: Diagnosis not present

## 2020-01-17 DIAGNOSIS — M6281 Muscle weakness (generalized): Secondary | ICD-10-CM | POA: Diagnosis not present

## 2020-01-17 NOTE — Therapy (Signed)
[facility] [address] [place], [place], [address] Phone: [phone]   Fax:  [phone]  Physical Therapy Treatment  Patient Details  Name: [name] MRN: [id number] Date of Birth: 14-Aug-1950 Referring Provider (PT): Clarene Reamer, FNP   Encounter Date: 01/17/2020   PT End of Session - 01/17/20 1151    Visit Number 4    Number of Visits 13    Date for PT Re-Evaluation 04/03/20   POC for 6 weeks, Cert for 90 days   Authorization Type [employer] Medicare (10th Visit PN)    Progress Note Due on Visit 10    PT Start Time 1147    PT Stop Time 1229    PT Time Calculation (min) 42 min    Equipment Utilized During Treatment Gait belt    Activity Tolerance Patient tolerated treatment well    Behavior During Therapy [facility] for tasks assessed/performed;Flat affect           Past Medical History:  Diagnosis Date  . Allergy   . Anxiety   . Arnold-Chiari malformation ([place])   . Arthritis   . CHF (congestive heart failure) ([place])   . Chronic systolic heart failure ([place]) 11/03/2019  . Coronary artery disease   . Encounter for assessment of implantable cardioverter-defibrillator (ICD) 11/10/2019  . GERD (gastroesophageal reflux disease)   . H. pylori infection    3 years ago  . H/O cardiac arrest    Hx of VT/Vfib arrest  . Hyperlipidemia   . ICD BiV Medtronic model [id number] Claria Quad CRT-D SureScan ICD 08/25/2019   . Incontinence   . Paroxysmal atrial fibrillation ([place]) 08/06/2019  . Syncope and collapse    Per pt, denies passing out  . Tobacco use disorder   . Unspecified essential hypertension     Past Surgical History:  Procedure Laterality Date  . APPENDECTOMY    . ARNOLD CHIARI SURGERY     neurocranial surgery  . BIV ICD INSERTION CRT-D N/A 08/25/2019   Procedure: BIV ICD INSERTION CRT-D;  Surgeon: Constance Haw, MD;  Location: [facility];  Service: Cardiovascular;  Laterality: N/A;  .  BLEPHAROPLASTY     Bil  . C-EYE SURGERY PROCEDURE    . CARDIAC CATHETERIZATION    . CHOLECYSTECTOMY    . EYE SURGERY    . heart failure    . LEFT HEART CATH AND CORONARY ANGIOGRAPHY N/A 08/04/2019   Procedure: LEFT HEART CATH AND CORONARY ANGIOGRAPHY;  Surgeon: Adrian Prows, MD;  Location: [facility];  Service: Cardiovascular;  Laterality: N/A;  . MASS EXCISION Right 12/27/2013   Procedure: MINOR EXCISION OF RIGHT THUMB MUCOID CYST, DEBRIDEMENT OF INTERPHALANGEAL JOINT;  Surgeon: Cammie Sickle, MD;  Location: [facility];  Service: Orthopedics;  Laterality: Right;  . mass on thumb  right  . TOTAL KNEE ARTHROPLASTY Right 02/17/2017  . TOTAL KNEE ARTHROPLASTY Right 02/17/2017   Procedure: TOTAL KNEE ARTHROPLASTY;  Surgeon: Melrose Nakayama, MD;  Location: [facility];  Service: Orthopedics;  Laterality: Right;  . TUBAL LIGATION      There were no vitals filed for this visit.   Subjective Assessment - 01/17/20 1150    Subjective Patient reports that she is feeling better today.    Patient is accompained by: Family member   Daughter   Pertinent History PMH includes anxiety, HTN, Chiari I malformation, a fib, CKD stage III, HLD, hx of R knee replacement, hx of sciatica for several  years, Acute Ischemic CVA    Limitations Walking;Standing    Patient Stated Goals Improve strength/endurance    Currently in Pain? Yes    Pain Score 3     Pain Location Knee    Pain Orientation Right    Pain Descriptors / Indicators Tender    Pain Type Chronic pain    Pain Onset More than a month ago                             [facility] Adult PT Treatment/Exercise - 01/17/20 0001      Transfers   Transfers Sit to Stand;Stand to Sit    Sit to Stand 4: Min guard    Stand to Sit 4: Min guard    Comments completed 2 x 10 reps of sit <> stands from mat, with BLE on blue foam. focus on controlled descent with completion.       Ambulation/Gait   Ambulation/Gait Yes     Ambulation/Gait Assistance 5: Supervision    Ambulation/Gait Assistance Details throughout therapy with activities    Assistive device None    Gait Pattern Step-through pattern    Ambulation Surface Level;Indoor      Exercises   Exercises Knee/Hip      Knee/Hip Exercises: Aerobic   Other Aerobic Completed SciFit with BLE/BUE for improved endurance training on Level 2.0. Completed 5 minutes at RPM > 50, followed by minute rest break. then completed x 2 minutes. BP: 118/68 after completion      Knee/Hip Exercises: Standing   Lateral Step Up Both;1 set;10 reps;Hand Hold: 2;Step Height: 6";Limitations    Lateral Step Up Limitations verbal cues for posture upon standing. patient demo difficulty with attempt to complete without UE support    Forward Step Up Both;1 set;15 reps;Hand Hold: 1;Step Height: 6";Limitations    Forward Step Up Limitations alternating leading lower extremity. completed with single UE support, pt demo increased difficulty without UE support due leg weakness.                Balance Exercises - 01/17/20 0001      Balance Exercises: Standing   Standing Eyes Opened Narrow base of support (BOS);Head turns;Foam/compliant surface;Limitations    Standing Eyes Opened Limitations completed horizontal/vertical head turns 2 x 10 reps without UE support    Standing Eyes Closed Narrow base of support (BOS);Foam/compliant surface;3 reps;30 secs;Limitations    Standing Eyes Closed Limitations increased sway noted with vision removed, intermittent CGA    Tandem Stance Eyes open;Foam/compliant surface;Intermittent upper extremity support;4 reps;30 secs;Limitations    Tandem Stance Time completed alteranting lower extremity placement, intermittent UE support from // bars.     Stepping Strategy Anterior;Posterior;Foam/compliant surface;10 reps;Limitations    Stepping Strategy Limitations alternating, 1 x 10 reps anterior and posterior. increased verbal cues required for improved  technique. completed without UE support. patient demo 2-3 instances of catching toe with posterior stepping.                PT Short Term Goals - 01/04/20 1627      PT SHORT TERM GOAL #1   Title Pt will be independent with initial HEP to improve balance/BLE strength    Baseline no HEP at this time    Time 3    Period Weeks    Status New    Target Date 01/25/20      PT SHORT TERM GOAL #2   Title Patient will improve 5x  sit <> stand to </= 20 seconds to demonstrate improved functional strength    Baseline 22.03    Time 3    Period Weeks    Status New    Target Date 01/25/20             PT Long Term Goals - 01/04/20 1629      PT LONG TERM GOAL #1   Title Pt will be independent with final HEP for BLE strenghtening/balance ( ALL LTGS DUE 02/15/2020)    Baseline no HEP at this time    Time 6    Period Weeks    Status New    Target Date 02/15/20      PT LONG TERM GOAL #2   Title Patient will improve FGA by 5 points from baseline to demo decreased fall risk    Baseline not yet assessed    Time 6    Period Weeks    Status New    Target Date 02/15/20      PT LONG TERM GOAL #3   Title Patient will ambulate 1,200 ft in 6 minutes w/o AD to demo improved endurance with walking    Baseline 851    Time 6    Period Weeks    Status New      PT LONG TERM GOAL #4   Title Patient will demo improved 5x sit <> stand to </= 15 seconds without UE support for improved functional BLE strength    Baseline 22.03    Time 6    Period Weeks    Status New                 Plan - 01/17/20 1220    Clinical Impression Statement Continued session focused on BLE strengthening and endurance training on SciFit with patient tolerating well. Intermittent rest breaks required throughout due to fatigue. Patient demo dificulty without forward/lateral step ups with UE support removed today. Patient will continue to benefit from skilled PT services to progress toward all goals.    Personal  Factors and Comorbidities Comorbidity 3+    Comorbidities anxiety, HTN, Chiari I malformation, a fib, CKD stage III, HLD, hx of R knee replacement, hx of sciatica for several years, Acute Ischemic CVA    Examination-Activity Limitations Squat;Stairs;Stand    Examination-Participation Restrictions Community Activity;Yard Work;Driving    Stability/Clinical Decision Making Evolving/Moderate complexity    Rehab Potential Good    PT Frequency 2x / week    PT Duration 6 weeks    PT Treatment/Interventions ADLs/Self Care Home Management;Cryotherapy;Electrical Stimulation;Moist Heat;DME Instruction;Gait training;Stair training;Functional mobility training;Therapeutic activities;Therapeutic exercise;Balance training;Neuromuscular re-education;Patient/family education;Manual techniques;Passive range of motion;Energy conservation    PT Next Visit Plan Continue standing balance - LLE SLS. Continues standing strengthening activites, endurance activites (scifit/nustep)    Consulted and Agree with Plan of Care Patient;Family member/caregiver    Family Member Consulted Daughter           Patient will benefit from skilled therapeutic intervention in order to improve the following deficits and impairments:  Abnormal gait, Decreased balance, Decreased endurance, Decreased range of motion, Decreased activity tolerance, Decreased strength, Difficulty walking, Pain, Postural dysfunction  Visit Diagnosis: Unsteadiness on feet  Muscle weakness (generalized)  Other abnormalities of gait and mobility     Problem List Patient Active Problem List   Diagnosis Date Noted  . Encounter for assessment of implantable cardioverter-defibrillator (ICD) 11/10/2019  . H/O cardiac arrest 08/03/2019 11/10/2019  . Coronary artery disease involving native coronary artery of  native heart without angina pectoris 11/03/2019  . Benign hypertensive CKD, stage 1-4 or unspecified chronic kidney disease 11/03/2019  . Chronic  systolic heart failure ([place]) 11/03/2019  . Lightheadedness 11/03/2019  . CHF (congestive heart failure) ([place]) 10/18/2019  . Pruritus 09/01/2019  . ICD BiV Medtronic model [id number] Claria Quad CRT-D SureScan ICD 08/25/2019   . Cardiac arrest with ventricular fibrillation ([place]) 08/24/2019  . TIA (transient ischemic attack) 08/15/2019  . Long term current use of anticoagulant 08/14/2019  . Cardiomyopathy ([place]) 08/14/2019  . Paroxysmal atrial fibrillation ([place]) 08/06/2019  . Statin intolerance 08/03/2019  . Dysphagia   . History of CVA (cerebrovascular accident) 08/02/2019  . Hypokalemia 08/02/2019  . Left bundle branch block 08/02/2019  . CKD (chronic kidney disease) stage 3, GFR 30-59 ml/min 12/13/2018  . Primary localized osteoarthritis of right knee 02/17/2017  . Primary osteoarthritis of right knee 02/17/2017  . Preventative health care 09/01/2016  . Vitamin B12 deficiency 09/10/2015  . Insomnia 07/16/2015  . GERD (gastroesophageal reflux disease) 03/26/2015  . Fatigue 10/02/2014  . Other malaise and fatigue 02/08/2014  . HLD (hyperlipidemia) 11/19/2011  . Vaginal dryness, menopausal 11/19/2011  . Adjustment disorder 01/08/2011  . Former smoker 12/19/2008  . Essential hypertension 12/19/2008    Jones Bales, PT, DPT 01/17/2020, 1:46 PM  [facility] [address] [place], [place], [address] Phone: [phone]   Fax:  [phone]  Name: AUSHA SIEH MRN: [id number] Date of Birth: 1951/03/16

## 2020-01-18 ENCOUNTER — Other Ambulatory Visit: Payer: Self-pay | Admitting: Family Medicine

## 2020-01-18 ENCOUNTER — Encounter: Payer: Medicare HMO | Admitting: Family Medicine

## 2020-01-18 DIAGNOSIS — F329 Major depressive disorder, single episode, unspecified: Secondary | ICD-10-CM

## 2020-01-18 MED ORDER — ONDANSETRON 4 MG PO TBDP: 4.0000 mg | ORAL_TABLET | Freq: Three times a day (TID) | ORAL | 0 refills | Status: DC | PRN

## 2020-01-19 ENCOUNTER — Ambulatory Visit: Payer: Medicare HMO

## 2020-01-19 ENCOUNTER — Other Ambulatory Visit: Payer: Self-pay

## 2020-01-19 ENCOUNTER — Other Ambulatory Visit: Payer: Self-pay | Admitting: Family Medicine

## 2020-01-19 VITALS — BP 148/75 | HR 71

## 2020-01-19 DIAGNOSIS — R278 Other lack of coordination: Secondary | ICD-10-CM | POA: Diagnosis not present

## 2020-01-19 DIAGNOSIS — R2681 Unsteadiness on feet: Secondary | ICD-10-CM | POA: Diagnosis not present

## 2020-01-19 DIAGNOSIS — F329 Major depressive disorder, single episode, unspecified: Secondary | ICD-10-CM

## 2020-01-19 DIAGNOSIS — R2689 Other abnormalities of gait and mobility: Secondary | ICD-10-CM | POA: Diagnosis not present

## 2020-01-19 DIAGNOSIS — M6281 Muscle weakness (generalized): Secondary | ICD-10-CM | POA: Diagnosis not present

## 2020-01-19 MED ORDER — SERTRALINE HCL 50 MG PO TABS: 50.0000 mg | ORAL_TABLET | Freq: Every day | ORAL | 3 refills | Status: DC

## 2020-01-19 NOTE — Therapy (Signed)
[facility] [address] [place], [place], [address] Phone: [phone]   Fax:  [phone]  Physical Therapy Treatment  Patient Details  Name: [name] MRN: [id number] Date of Birth: Mar 10, 1951 Referring Provider (PT): Clarene Reamer, FNP   Encounter Date: 01/19/2020   PT End of Session - 01/19/20 1236    Visit Number 5    Number of Visits 13    Date for PT Re-Evaluation 04/03/20   POC for 6 weeks, Cert for 90 days   Authorization Type [employer] Medicare (10th Visit PN)    Progress Note Due on Visit 10    PT Start Time 1234   patient needing to use bathroom prior to session   PT Stop Time 1313    PT Time Calculation (min) 39 min    Equipment Utilized During Treatment Gait belt    Activity Tolerance Patient tolerated treatment well    Behavior During Therapy [facility] for tasks assessed/performed;Flat affect           Past Medical History:  Diagnosis Date  . Allergy   . Anxiety   . Arnold-Chiari malformation ([place])   . Arthritis   . CHF (congestive heart failure) ([place])   . Chronic systolic heart failure ([place]) 11/03/2019  . Coronary artery disease   . Encounter for assessment of implantable cardioverter-defibrillator (ICD) 11/10/2019  . GERD (gastroesophageal reflux disease)   . H. pylori infection    3 years ago  . H/O cardiac arrest    Hx of VT/Vfib arrest  . Hyperlipidemia   . ICD BiV Medtronic model [id number] Claria Quad CRT-D SureScan ICD 08/25/2019   . Incontinence   . Paroxysmal atrial fibrillation ([place]) 08/06/2019  . Syncope and collapse    Per pt, denies passing out  . Tobacco use disorder   . Unspecified essential hypertension     Past Surgical History:  Procedure Laterality Date  . APPENDECTOMY    . ARNOLD CHIARI SURGERY     neurocranial surgery  . BIV ICD INSERTION CRT-D N/A 08/25/2019   Procedure: BIV ICD INSERTION CRT-D;  Surgeon: Constance Haw, MD;  Location: [place] [facility];   Service: Cardiovascular;  Laterality: N/A;  . BLEPHAROPLASTY     Bil  . C-EYE SURGERY PROCEDURE    . CARDIAC CATHETERIZATION    . CHOLECYSTECTOMY    . EYE SURGERY    . heart failure    . LEFT HEART CATH AND CORONARY ANGIOGRAPHY N/A 08/04/2019   Procedure: LEFT HEART CATH AND CORONARY ANGIOGRAPHY;  Surgeon: Adrian Prows, MD;  Location: [place] [facility];  Service: Cardiovascular;  Laterality: N/A;  . MASS EXCISION Right 12/27/2013   Procedure: MINOR EXCISION OF RIGHT THUMB MUCOID CYST, DEBRIDEMENT OF INTERPHALANGEAL JOINT;  Surgeon: Cammie Sickle, MD;  Location: [place];  Service: Orthopedics;  Laterality: Right;  . mass on thumb  right  . TOTAL KNEE ARTHROPLASTY Right 02/17/2017  . TOTAL KNEE ARTHROPLASTY Right 02/17/2017   Procedure: TOTAL KNEE ARTHROPLASTY;  Surgeon: Melrose Nakayama, MD;  Location: [facility];  Service: Orthopedics;  Laterality: Right;  . TUBAL LIGATION      Vitals:   01/19/20 1248  BP: (!) 148/75  Pulse: 71     Subjective Assessment - 01/19/20 1236    Subjective Patient reports that she has been nauseous since yesterday. Feeling okay today, just fatigued.    Patient is accompained by: Family member   Daughter   Pertinent History PMH includes  anxiety, HTN, Chiari I malformation, a fib, CKD stage III, HLD, hx of R knee replacement, hx of sciatica for several years, Acute Ischemic CVA    Limitations Walking;Standing    Patient Stated Goals Improve strength/endurance    Currently in Pain? Yes    Pain Score 5     Pain Location Groin    Pain Orientation Left    Pain Descriptors / Indicators --   feels like pulled muscle   Pain Type Acute pain    Pain Onset In the past 7 days    Pain Frequency Intermittent                             [facility] Adult PT Treatment/Exercise - 01/19/20 0001      Ambulation/Gait   Ambulation/Gait Yes    Ambulation/Gait Assistance 5: Supervision    Ambulation/Gait Assistance Details throughout therapy  gym with activities    Assistive device None    Gait Pattern Step-through pattern    Ambulation Surface Level;Indoor      High Level Balance   High Level Balance Activities Tandem walking;Marching forwards    High Level Balance Comments On blue mat in // bars, completed the following: marching forwards, tandem walking, backwards walking, and side stepping. Completed x 4 laps of the following exercises. Intermittent UE support and CGA required from PT.       Knee/Hip Exercises: Aerobic   Other Aerobic Completed SciFit with BLE/BUE for improved endurance training on Level 2.0. Completed x 6 minutes at RPM > 50.                Balance Exercises - 01/19/20 0001      Balance Exercises: Standing   Stepping Strategy Anterior;Posterior;Foam/compliant surface;10 reps;Limitations    Stepping Strategy Limitations alternating x 15 reps each on blue foam beam. Patient require two instances of toe catching, require verbal cues for increased foot clearance.     Rockerboard Anterior/posterior;Lateral;EO;EC;Limitations    Rockerboard Limitations completed ant/post weight shift 1 x 10 reps and lateral weight shift 1 x 10 reps, increased difficutly with maintaining balance with posterior weight shift. Completed EC, 2 x 30 seconds with board postioned ant/post, attempted EC with board positioned laterally but patietn reporting dizziness.     Sidestepping Foam/compliant support;4 reps;Limitations    Sidestepping Limitations completed side stepping without UE support, down and back x 4 reps on blue balance beam, one instance of toe catching requiring UE support               PT Short Term Goals - 01/04/20 1627      PT SHORT TERM GOAL #1   Title Pt will be independent with initial HEP to improve balance/BLE strength    Baseline no HEP at this time    Time 3    Period Weeks    Status New    Target Date 01/25/20      PT SHORT TERM GOAL #2   Title Patient will improve 5x sit <> stand to </= 20  seconds to demonstrate improved functional strength    Baseline 22.03    Time 3    Period Weeks    Status New    Target Date 01/25/20             PT Long Term Goals - 01/04/20 1629      PT LONG TERM GOAL #1   Title Pt will be independent with final HEP  for BLE strenghtening/balance ( ALL LTGS DUE 02/15/2020)    Baseline no HEP at this time    Time 6    Period Weeks    Status New    Target Date 02/15/20      PT LONG TERM GOAL #2   Title Patient will improve FGA by 5 points from baseline to demo decreased fall risk    Baseline not yet assessed    Time 6    Period Weeks    Status New    Target Date 02/15/20      PT LONG TERM GOAL #3   Title Patient will ambulate 1,200 ft in 6 minutes w/o AD to demo improved endurance with walking    Baseline 851    Time 6    Period Weeks    Status New      PT LONG TERM GOAL #4   Title Patient will demo improved 5x sit <> stand to </= 15 seconds without UE support for improved functional BLE strength    Baseline 22.03    Time 6    Period Weeks    Status New                 Plan - 01/19/20 1308    Clinical Impression Statement Today's skilled PT session focused on balance exercises and progressing as tolerated, as well as endurance training for improved activity tolerance. Limited during sesion due to nauseous with patient reporting due to change in medication. Patient will continue to benefit from skilled PT services to progress toward all goals.    Personal Factors and Comorbidities Comorbidity 3+    Comorbidities anxiety, HTN, Chiari I malformation, a fib, CKD stage III, HLD, hx of R knee replacement, hx of sciatica for several years, Acute Ischemic CVA    Examination-Activity Limitations Squat;Stairs;Stand    Examination-Participation Restrictions Community Activity;Yard Work;Driving    Stability/Clinical Decision Making Evolving/Moderate complexity    Rehab Potential Good    PT Frequency 2x / week    PT Duration 6 weeks     PT Treatment/Interventions ADLs/Self Care Home Management;Cryotherapy;Electrical Stimulation;Moist Heat;DME Instruction;Gait training;Stair training;Functional mobility training;Therapeutic activities;Therapeutic exercise;Balance training;Neuromuscular re-education;Patient/family education;Manual techniques;Passive range of motion;Energy conservation    PT Next Visit Plan Check STGs. Continue standing balance - LLE SLS. Continues standing strengthening activites, endurance activites (scifit/nustep)    Consulted and Agree with Plan of Care Patient;Family member/caregiver    Family Member Consulted Daughter           Patient will benefit from skilled therapeutic intervention in order to improve the following deficits and impairments:  Abnormal gait, Decreased balance, Decreased endurance, Decreased range of motion, Decreased activity tolerance, Decreased strength, Difficulty walking, Pain, Postural dysfunction  Visit Diagnosis: Unsteadiness on feet  Muscle weakness (generalized)  Other abnormalities of gait and mobility     Problem List Patient Active Problem List   Diagnosis Date Noted  . Encounter for assessment of implantable cardioverter-defibrillator (ICD) 11/10/2019  . H/O cardiac arrest 08/03/2019 11/10/2019  . Coronary artery disease involving native coronary artery of native heart without angina pectoris 11/03/2019  . Benign hypertensive CKD, stage 1-4 or unspecified chronic kidney disease 11/03/2019  . Chronic systolic heart failure ([place]) 11/03/2019  . Lightheadedness 11/03/2019  . CHF (congestive heart failure) ([place]) 10/18/2019  . Pruritus 09/01/2019  . ICD BiV Medtronic model [id number] Claria Quad CRT-D SureScan ICD 08/25/2019   . Cardiac arrest with ventricular fibrillation ([place]) 08/24/2019  . TIA (transient ischemic attack) 08/15/2019  .  Long term current use of anticoagulant 08/14/2019  . Cardiomyopathy ([place]) 08/14/2019  . Paroxysmal atrial fibrillation ([place]) 08/06/2019   . Statin intolerance 08/03/2019  . Dysphagia   . History of CVA (cerebrovascular accident) 08/02/2019  . Hypokalemia 08/02/2019  . Left bundle branch block 08/02/2019  . CKD (chronic kidney disease) stage 3, GFR 30-59 ml/min 12/13/2018  . Primary localized osteoarthritis of right knee 02/17/2017  . Primary osteoarthritis of right knee 02/17/2017  . Preventative health care 09/01/2016  . Vitamin B12 deficiency 09/10/2015  . Insomnia 07/16/2015  . GERD (gastroesophageal reflux disease) 03/26/2015  . Fatigue 10/02/2014  . Other malaise and fatigue 02/08/2014  . HLD (hyperlipidemia) 11/19/2011  . Vaginal dryness, menopausal 11/19/2011  . Adjustment disorder 01/08/2011  . Former smoker 12/19/2008  . Essential hypertension 12/19/2008    Jones Bales, PT, DPT 01/19/2020, 1:14 PM  [facility] [address] [place], [place], [address] Phone: [phone]   Fax:  [phone]  Name: [name] MRN: [id number] Date of Birth: 1951-02-04

## 2020-01-24 ENCOUNTER — Other Ambulatory Visit: Payer: Self-pay

## 2020-01-24 ENCOUNTER — Ambulatory Visit: Payer: Medicare HMO | Admitting: Physical Therapy

## 2020-01-24 VITALS — BP 111/59 | HR 74

## 2020-01-24 DIAGNOSIS — R2689 Other abnormalities of gait and mobility: Secondary | ICD-10-CM

## 2020-01-24 DIAGNOSIS — R2681 Unsteadiness on feet: Secondary | ICD-10-CM | POA: Diagnosis not present

## 2020-01-24 DIAGNOSIS — M6281 Muscle weakness (generalized): Secondary | ICD-10-CM

## 2020-01-24 DIAGNOSIS — R278 Other lack of coordination: Secondary | ICD-10-CM

## 2020-01-24 NOTE — Patient Instructions (Signed)
Access Code: [email or web address] URL: [email or web address] Date: 01/24/2020 Prepared by: Janann August  Exercises Sit to Stand - 1 x daily - 7 x weekly - 10 reps - 3 sets Standing Hip Abduction with Counter Support - 1 x daily - 7 x weekly - 10 reps - 2 sets Standing Hip Extension with Counter Support - 1 x daily - 7 x weekly - 10 reps - 2 sets Heel Toe Raises with Counter Support - 1 x daily - 7 x weekly - 3 sets - 10 reps Side Stepping with Resistance at Thighs and Counter Support - 1 x daily - 7 x weekly - 10 reps - 3 sets Standing Marching - 1 x daily - 7 x weekly - 2 sets

## 2020-01-24 NOTE — Therapy (Signed)
[facility] [address] [place], [place], [address] Phone: [phone]   Fax:  [phone]  Physical Therapy Treatment  Patient Details  Name: [name] MRN: [id number] Date of Birth: 1951/04/09 Referring Provider (PT): Olean Ree, FNP   Encounter Date: 01/24/2020   PT End of Session - 01/24/20 1323    Visit Number 6    Number of Visits 13    Date for PT Re-Evaluation 04/03/20   POC for 6 weeks, Cert for 90 days   Authorization Type [employer]na Medicare (10th Visit PN)    Progress Note Due on Visit 10    PT Start Time 1232    PT Stop Time 1315    PT Time Calculation (min) 43 min    Equipment Utilized During Treatment Gait belt    Activity Tolerance Patient tolerated treatment well    Behavior During Therapy [facility] for tasks assessed/performed;Flat affect           Past Medical History:  Diagnosis Date  . Allergy   . Anxiety   . Arnold-Chiari malformation ([facility])   . Arthritis   . CHF (congestive heart failure) ([facility])   . Chronic systolic heart failure ([facility]) 11/03/2019  . Coronary artery disease   . Encounter for assessment of implantable cardioverter-defibrillator (ICD) 11/10/2019  . GERD (gastroesophageal reflux disease)   . H. pylori infection    3 years ago  . H/O cardiac arrest    Hx of VT/Vfib arrest  . Hyperlipidemia   . ICD BiV Medtronic model [id number] Claria Quad CRT-D SureScan ICD 08/25/2019   . Incontinence   . Paroxysmal atrial fibrillation ([facility]) 08/06/2019  . Syncope and collapse    Per pt, denies passing out  . Tobacco use disorder   . Unspecified essential hypertension     Past Surgical History:  Procedure Laterality Date  . APPENDECTOMY    . ARNOLD CHIARI SURGERY     neurocranial surgery  . BIV ICD INSERTION CRT-D N/A 08/25/2019   Procedure: BIV ICD INSERTION CRT-D;  Surgeon: Regan Lemming, MD;  Location: [facility] INVASIVE [facility];  Service: Cardiovascular;  Laterality: N/A;  .  BLEPHAROPLASTY     Bil  . C-EYE SURGERY PROCEDURE    . CARDIAC CATHETERIZATION    . CHOLECYSTECTOMY    . EYE SURGERY    . heart failure    . LEFT HEART CATH AND CORONARY ANGIOGRAPHY N/A 08/04/2019   Procedure: LEFT HEART CATH AND CORONARY ANGIOGRAPHY;  Surgeon: Yates Decamp, MD;  Location: [facility] INVASIVE [facility] LAB;  Service: Cardiovascular;  Laterality: N/A;  . MASS EXCISION Right 12/27/2013   Procedure: MINOR EXCISION OF RIGHT THUMB MUCOID CYST, DEBRIDEMENT OF INTERPHALANGEAL JOINT;  Surgeon: Wyn Forster, MD;  Location: [facility];  Service: Orthopedics;  Laterality: Right;  . mass on thumb  right  . TOTAL KNEE ARTHROPLASTY Right 02/17/2017  . TOTAL KNEE ARTHROPLASTY Right 02/17/2017   Procedure: TOTAL KNEE ARTHROPLASTY;  Surgeon: Marcene Corning, MD;  Location: [facility] OR;  Service: Orthopedics;  Laterality: Right;  . TUBAL LIGATION      Vitals:   01/24/20 1314  BP: (!) 111/59  Pulse: 74     Subjective Assessment - 01/24/20 1235    Subjective Feeling a lot better today - stomach is finally feeling better.    Patient is accompained by: Family member   Daughter   Pertinent History PMH includes anxiety, HTN, Chiari I malformation, a fib, CKD stage III, HLD, hx  of R knee replacement, hx of sciatica for several years, Acute Ischemic CVA    Limitations Walking;Standing    Patient Stated Goals Improve strength/endurance    Currently in Pain? No/denies    Pain Onset In the past 7 days                             [facility] Adult PT Treatment/Exercise - 01/24/20 1235      Transfers   Transfers Sit to Stand;Stand to Sit    Sit to Stand 5: Supervision;Without upper extremity assist;From chair/3-in-1    Five time sit to stand comments  14 seconds    Stand to Sit 5: Supervision;Without upper extremity assist;To chair/3-in-1      Knee/Hip Exercises: Aerobic   Other Aerobic SciFit with BLE and BUE for strengthening, endurance, and activity tolerance. level 2.0 x 5  minutes . HR 98 bpm and O2 at 98% afterwards              Access Code: [email or web address] URL: [email or web address] Date: 01/24/2020 Prepared by: Janann August  Reviewed and upgraded HEP:   Exercises Sit to Stand - 1 x daily - 7 x weekly - 10 reps - 3 sets Standing Hip Abduction with Counter Support - 1 x daily - 7 x weekly - 10 reps - 2 sets - needed initial review for technique, upgraded to use of red theraband for resistance  Standing Hip Extension with Counter Support - 1 x daily - 7 x weekly - 10 reps - 2 sets - initial verbal and demo cues for proper technique (as pt initially performing with incr forward flexed posture), upgraded to use of red theraband for resistance  Heel Toe Raises with Counter Support - 1 x daily - 7 x weekly - 3 sets - 10 reps Side Stepping with Resistance at Thighs and Counter Support - 1 x daily - 7 x weekly - 10 reps - 3 sets - cues for technique and keeping toes pointed forward for hip ABD activation  Standing Marching - 1 x daily - 7 x weekly - 2 sets - with use of red theraband around thighs for resistance, fingertip support at counter, cues for slowed and controlled for SLS      PT Education - 01/24/20 1308    Education Details progress towards goals, upgraded/reviewed HEP    Person(s) Educated Patient    Methods Explanation;Demonstration;Handout;Verbal cues    Comprehension Verbalized understanding;Returned demonstration            PT Short Term Goals - 01/24/20 1237      PT SHORT TERM GOAL #1   Title Pt will be independent with initial HEP to improve balance/BLE strength    Baseline pt performing HEP - needs intermittent verbal cues for technique    Time 3    Period Weeks    Status Partially Met    Target Date 01/25/20      PT SHORT TERM GOAL #2   Title Patient will improve 5x sit <> stand to </= 20 seconds to demonstrate improved functional strength    Baseline 14 seconds with no UE support from standard height chair on  01/24/20    Time 3    Period Weeks    Status Achieved    Target Date 01/25/20             PT Long Term Goals - 01/04/20 1629      PT LONG TERM GOAL #  1   Title Pt will be independent with final HEP for BLE strenghtening/balance ( ALL LTGS DUE 02/15/2020)    Baseline no HEP at this time    Time 6    Period Weeks    Status New    Target Date 02/15/20      PT LONG TERM GOAL #2   Title Patient will improve FGA by 5 points from baseline to demo decreased fall risk    Baseline not yet assessed    Time 6    Period Weeks    Status New    Target Date 02/15/20      PT LONG TERM GOAL #3   Title Patient will ambulate 1,200 ft in 6 minutes w/o AD to demo improved endurance with walking    Baseline 851    Time 6    Period Weeks    Status New      PT LONG TERM GOAL #4   Title Patient will demo improved 5x sit <> stand to </= 15 seconds without UE support for improved functional BLE strength    Baseline 22.03    Time 6    Period Weeks    Status New                 Plan - 01/24/20 1322    Clinical Impression Statement Focus of today's skilled session was assessing pt's STGs. Pt met STG #2 - able to perform 5x sit <> stand with no UE support in 14 seconds (previously approx. 22 seconds). Pt partially met STG #1 - needed to review technique for certain exercises on HEP. Also upgraded HEP for use of red theraband for strengthening for standing marching, hip ABD and hip extension. Pt tolerated session well - will continue to progress towards LTGs.    Personal Factors and Comorbidities Comorbidity 3+    Comorbidities anxiety, HTN, Chiari I malformation, a fib, CKD stage III, HLD, hx of R knee replacement, hx of sciatica for several years, Acute Ischemic CVA    Examination-Activity Limitations Squat;Stairs;Stand    Examination-Participation Restrictions Community Activity;Yard Work;Driving    Stability/Clinical Decision Making Evolving/Moderate complexity    Rehab Potential Good     PT Frequency 2x / week    PT Duration 6 weeks    PT Treatment/Interventions ADLs/Self Care Home Management;Cryotherapy;Electrical Stimulation;Moist Heat;DME Instruction;Gait training;Stair training;Functional mobility training;Therapeutic activities;Therapeutic exercise;Balance training;Neuromuscular re-education;Patient/family education;Manual techniques;Passive range of motion;Energy conservation    PT Next Visit Plan Continue standing balance - LLE SLS. Continues standing strengthening activites, endurance activites (scifit/nustep)    PT Home Exercise Plan [email or web address]    Consulted and Agree with Plan of Care Patient;Family member/caregiver    Family Member Consulted Daughter           Patient will benefit from skilled therapeutic intervention in order to improve the following deficits and impairments:  Abnormal gait, Decreased balance, Decreased endurance, Decreased range of motion, Decreased activity tolerance, Decreased strength, Difficulty walking, Pain, Postural dysfunction  Visit Diagnosis: Unsteadiness on feet  Muscle weakness (generalized)  Other abnormalities of gait and mobility  Other lack of coordination     Problem List Patient Active Problem List   Diagnosis Date Noted  . Encounter for assessment of implantable cardioverter-defibrillator (ICD) 11/10/2019  . H/O cardiac arrest 08/03/2019 11/10/2019  . Coronary artery disease involving native coronary artery of native heart without angina pectoris 11/03/2019  . Benign hypertensive CKD, stage 1-4 or unspecified chronic kidney disease 11/03/2019  . Chronic systolic heart failure ([facility])  11/03/2019  . Lightheadedness 11/03/2019  . CHF (congestive heart failure) ([place]) 10/18/2019  . Pruritus 09/01/2019  . ICD BiV Medtronic model [id number] Claria Quad CRT-D SureScan ICD 08/25/2019   . Cardiac arrest with ventricular fibrillation ([place]) 08/24/2019  . TIA (transient ischemic attack) 08/15/2019  . Long term current use of  anticoagulant 08/14/2019  . Cardiomyopathy ([place]) 08/14/2019  . Paroxysmal atrial fibrillation ([place]) 08/06/2019  . Statin intolerance 08/03/2019  . Dysphagia   . History of CVA (cerebrovascular accident) 08/02/2019  . Hypokalemia 08/02/2019  . Left bundle branch block 08/02/2019  . CKD (chronic kidney disease) stage 3, GFR 30-59 ml/min 12/13/2018  . Primary localized osteoarthritis of right knee 02/17/2017  . Primary osteoarthritis of right knee 02/17/2017  . Preventative health care 09/01/2016  . Vitamin B12 deficiency 09/10/2015  . Insomnia 07/16/2015  . GERD (gastroesophageal reflux disease) 03/26/2015  . Fatigue 10/02/2014  . Other malaise and fatigue 02/08/2014  . HLD (hyperlipidemia) 11/19/2011  . Vaginal dryness, menopausal 11/19/2011  . Adjustment disorder 01/08/2011  . Former smoker 12/19/2008  . Essential hypertension 12/19/2008    Arliss Journey, PT, DPT  01/24/2020, 1:24 PM  [facility] [address] [place], [place], [address] Phone: [phone]   Fax:  [phone]  Name: BOBBIEJO ISHIKAWA MRN: [id number] Date of Birth: December 13, 1950

## 2020-01-27 ENCOUNTER — Ambulatory Visit: Payer: Medicare HMO | Admitting: Physical Therapy

## 2020-01-27 ENCOUNTER — Encounter: Payer: Self-pay | Admitting: Physical Therapy

## 2020-01-27 ENCOUNTER — Other Ambulatory Visit: Payer: Self-pay

## 2020-01-27 VITALS — BP 128/76 | HR 84

## 2020-01-27 DIAGNOSIS — R278 Other lack of coordination: Secondary | ICD-10-CM | POA: Diagnosis not present

## 2020-01-27 DIAGNOSIS — R2689 Other abnormalities of gait and mobility: Secondary | ICD-10-CM | POA: Diagnosis not present

## 2020-01-27 DIAGNOSIS — M6281 Muscle weakness (generalized): Secondary | ICD-10-CM

## 2020-01-27 DIAGNOSIS — R2681 Unsteadiness on feet: Secondary | ICD-10-CM | POA: Diagnosis not present

## 2020-01-27 NOTE — Therapy (Signed)
[facility] [address] [place], [place], [address] Phone: [phone]   Fax:  [phone]  Physical Therapy Treatment  Patient Details  Name: [name] MRN: [id number] Date of Birth: [birth year] Referring Provider (PT): Clarene Reamer, FNP   Encounter Date: 01/27/2020   PT End of Session - 01/27/20 1312    Visit Number 7    Number of Visits 13    Date for PT Re-Evaluation 04/03/20   POC for 6 weeks, Cert for 90 days   Authorization Type [employer] Medicare (10th Visit PN)    Progress Note Due on Visit 10    PT Start Time 1230    PT Stop Time 1310    PT Time Calculation (min) 40 min    Equipment Utilized During Treatment Gait belt    Activity Tolerance Patient tolerated treatment well    Behavior During Therapy [facility] for tasks assessed/performed;Flat affect           Past Medical History:  Diagnosis Date  . Allergy   . Anxiety   . Arnold-Chiari malformation ([place])   . Arthritis   . CHF (congestive heart failure) ([place])   . Chronic systolic heart failure ([place]) 11/03/2019  . Coronary artery disease   . Encounter for assessment of implantable cardioverter-defibrillator (ICD) 11/10/2019  . GERD (gastroesophageal reflux disease)   . H. pylori infection    3 years ago  . H/O cardiac arrest    Hx of VT/Vfib arrest  . Hyperlipidemia   . ICD BiV Medtronic model [id number] Cl[place] Quad CRT-D SureScan ICD 08/25/2019   . Incontinence   . Paroxysmal atrial fibrillation ([place]) 08/06/2019  . Syncope and collapse    Per pt, denies passing out  . Tobacco use disorder   . Unspecified essential hypertension     Past Surgical History:  Procedure Laterality Date  . APPENDECTOMY    . ARNOLD CHIARI SURGERY     neurocranial surgery  . BIV ICD INSERTION CRT-D N/A 08/25/2019   Procedure: BIV ICD INSERTION CRT-D;  Surgeon: Constance Haw, MD;  Location: [facility];  Service: Cardiovascular;  Laterality: N/A;  .  BLEPHAROPLASTY     Bil  . C-EYE SURGERY PROCEDURE    . CARDIAC CATHETERIZATION    . CHOLECYSTECTOMY    . EYE SURGERY    . heart failure    . LEFT HEART CATH AND CORONARY ANGIOGRAPHY N/A 08/04/2019   Procedure: LEFT HEART CATH AND CORONARY ANGIOGRAPHY;  Surgeon: Adrian Prows, MD;  Location: [facility];  Service: Cardiovascular;  Laterality: N/A;  . MASS EXCISION Right 12/27/2013   Procedure: MINOR EXCISION OF RIGHT THUMB MUCOID CYST, DEBRIDEMENT OF INTERPHALANGEAL JOINT;  Surgeon: Cammie Sickle, MD;  Location: [facility];  Service: Orthopedics;  Laterality: Right;  . mass on thumb  right  . TOTAL KNEE ARTHROPLASTY Right 02/17/2017  . TOTAL KNEE ARTHROPLASTY Right 02/17/2017   Procedure: TOTAL KNEE ARTHROPLASTY;  Surgeon: Melrose Nakayama, MD;  Location: [facility];  Service: Orthopedics;  Laterality: Right;  . TUBAL LIGATION      Vitals:   01/27/20 1234  BP: 128/76  Pulse: 84     Subjective Assessment - 01/27/20 1232    Subjective Just has a little bit of a headache. Is a little sleepy.    Patient is accompained by: Family member   Daughter   Pertinent History PMH includes anxiety, HTN, Chiari I malformation, a fib, CKD stage III, HLD, hx  of R knee replacement, hx of sciatica for several years, Acute Ischemic CVA    Limitations Walking;Standing    Patient Stated Goals Improve strength/endurance    Currently in Pain? No/denies    Pain Onset In the past 7 days                             [facility] Adult PT Treatment/Exercise - 01/27/20 1236      Transfers   Comments      Knee/Hip Exercises: Aerobic   Other Aerobic SciFit with BLE and BUE for strengthening and activity tolerance at level 2.0 for 6 minutes      Knee/Hip Exercises: Standing   Lateral Step Up Left;1 set;10 reps;Step Height: 6";Hand Hold: 0    Lateral Step Up Limitations cues to float non stance leg, pt needing to step it up on step at times for balance    Forward Step Up  Right;Left;1 set;10 reps;Hand Hold: 0;Step Height: 6"    Forward Step Up Limitations cues to float non stance leg      Knee/Hip Exercises: Supine   Bridges Strengthening;AROM;1 set;10 reps    Bridges Limitations cues for full ROM and glute activation and holding for 3 seconds before lowering. attempted single leg bridge on L with RLE marching, but pt unable to perform, instead performing x5 reps of lifting R heel up off mat table while in bridge position      Knee/Hip Exercises: Sidelying   Clams 2 x 10 reps on LLE with red theraband - 3 second holds at end range              Balance Exercises - 01/27/20 0001      Balance Exercises: Standing   SLS Eyes open;Foam/compliant surface;Intermittent upper extremity support    SLS Limitations on red balance beam: x15 reps total alternating forward toe taps to floor bubble and them performing forward tap and cross body tap, pt with incr difficulty with SLS on LLE     Marching Foam/compliant surface;Limitations    Marching Limitations on blue compliant mat: forward and backwards marching x3 reps cues for slowed and controlled, pt needing intermittent fingertip support at times when standing on LLE    Other Standing Exercises Comments standing on red balance beam: x10 reps mini squats with no UE support, verbal and demo cues for proper technique               PT Short Term Goals - 01/24/20 1237      PT SHORT TERM GOAL #1   Title Pt will be independent with initial HEP to improve balance/BLE strength    Baseline pt performing HEP - needs intermittent verbal cues for technique    Time 3    Period Weeks    Status Partially Met    Target Date 01/25/20      PT SHORT TERM GOAL #2   Title Patient will improve 5x sit <> stand to </= 20 seconds to demonstrate improved functional strength    Baseline 14 seconds with no UE support from standard height chair on 01/24/20    Time 3    Period Weeks    Status Achieved    Target Date 01/25/20              PT Long Term Goals - 01/04/20 1629      PT LONG TERM GOAL #1   Title Pt will be independent with final HEP  for BLE strenghtening/balance ( ALL LTGS DUE 02/15/2020)    Baseline no HEP at this time    Time 6    Period Weeks    Status New    Target Date 02/15/20      PT LONG TERM GOAL #2   Title Patient will improve FGA by 5 points from baseline to demo decreased fall risk    Baseline not yet assessed    Time 6    Period Weeks    Status New    Target Date 02/15/20      PT LONG TERM GOAL #3   Title Patient will ambulate 1,200 ft in 6 minutes w/o AD to demo improved endurance with walking    Baseline 851    Time 6    Period Weeks    Status New      PT LONG TERM GOAL #4   Title Patient will demo improved 5x sit <> stand to </= 15 seconds without UE support for improved functional BLE strength    Baseline 22.03    Time 6    Period Weeks    Status New                 Plan - 01/27/20 1313    Clinical Impression Statement Today's skilled session continued to focus on BLE strengthening and balance strategies on compliant surfaces, esp with SLS. Pt tolerated session well and needed less seated rest breaks today. Pt continues to have incr difficulty with SLS on LLE esp on compliant surfaces and needs min guard/ UE support at times for balance. Will continue to progress towards LTGs.    Personal Factors and Comorbidities Comorbidity 3+    Comorbidities anxiety, HTN, Chiari I malformation, a fib, CKD stage III, HLD, hx of R knee replacement, hx of sciatica for several years, Acute Ischemic CVA    Examination-Activity Limitations Squat;Stairs;Stand    Examination-Participation Restrictions Community Activity;Yard Work;Driving    Stability/Clinical Decision Making Evolving/Moderate complexity    Rehab Potential Good    PT Frequency 2x / week    PT Duration 6 weeks    PT Treatment/Interventions ADLs/Self Care Home Management;Cryotherapy;Electrical Stimulation;Moist  Heat;DME Instruction;Gait training;Stair training;Functional mobility training;Therapeutic activities;Therapeutic exercise;Balance training;Neuromuscular re-education;Patient/family education;Manual techniques;Passive range of motion;Energy conservation    PT Next Visit Plan Continue standing balance - LLE SLS. Continues standing strengthening activites, endurance activites (scifit/nustep)    PT Home Exercise Plan [email or web address]    Consulted and Agree with Plan of Care Patient;Family member/caregiver    Family Member Consulted Daughter           Patient will benefit from skilled therapeutic intervention in order to improve the following deficits and impairments:  Abnormal gait, Decreased balance, Decreased endurance, Decreased range of motion, Decreased activity tolerance, Decreased strength, Difficulty walking, Pain, Postural dysfunction  Visit Diagnosis: Unsteadiness on feet  Muscle weakness (generalized)  Other abnormalities of gait and mobility  Other lack of coordination     Problem List Patient Active Problem List   Diagnosis Date Noted  . Encounter for assessment of implantable cardioverter-defibrillator (ICD) 11/10/2019  . H/O cardiac arrest 08/03/2019 11/10/2019  . Coronary artery disease involving native coronary artery of native heart without angina pectoris 11/03/2019  . Benign hypertensive CKD, stage 1-4 or unspecified chronic kidney disease 11/03/2019  . Chronic systolic heart failure ([place]) 11/03/2019  . Lightheadedness 11/03/2019  . CHF (congestive heart failure) ([place]) 10/18/2019  . Pruritus 09/01/2019  . ICD BiV Medtronic model [id number] [employer] CRT-D SureScan  ICD 08/25/2019   . Cardiac arrest with ventricular fibrillation ([place]) 08/24/2019  . TIA (transient ischemic attack) 08/15/2019  . Long term current use of anticoagulant 08/14/2019  . Cardiomyopathy ([place]) 08/14/2019  . Paroxysmal atrial fibrillation ([place]) 08/06/2019  . Statin intolerance 08/03/2019  .  Dysphagia   . History of CVA (cerebrovascular accident) 08/02/2019  . Hypokalemia 08/02/2019  . Left bundle branch block 08/02/2019  . CKD (chronic kidney disease) stage 3, GFR 30-59 ml/min 12/13/2018  . Primary localized osteoarthritis of right knee 02/17/2017  . Primary osteoarthritis of right knee 02/17/2017  . Preventative health care 09/01/2016  . Vitamin B12 deficiency 09/10/2015  . Insomnia 07/16/2015  . GERD (gastroesophageal reflux disease) 03/26/2015  . Fatigue 10/02/2014  . Other malaise and fatigue 02/08/2014  . HLD (hyperlipidemia) 11/19/2011  . Vaginal dryness, menopausal 11/19/2011  . Adjustment disorder 01/08/2011  . Former smoker 12/19/2008  . Essential hypertension 12/19/2008    Arliss Journey, PT, DPT  01/27/2020, 1:14 PM  [facility] [address] [place], [place], [address] Phone: [phone]   Fax:  [phone]  Name: [name] MRN: [id number] Date of Birth: 1950/12/25

## 2020-01-30 DIAGNOSIS — H524 Presbyopia: Secondary | ICD-10-CM | POA: Diagnosis not present

## 2020-01-30 DIAGNOSIS — H2513 Age-related nuclear cataract, bilateral: Secondary | ICD-10-CM | POA: Diagnosis not present

## 2020-01-30 DIAGNOSIS — H52203 Unspecified astigmatism, bilateral: Secondary | ICD-10-CM | POA: Diagnosis not present

## 2020-01-30 DIAGNOSIS — H5213 Myopia, bilateral: Secondary | ICD-10-CM | POA: Diagnosis not present

## 2020-01-31 ENCOUNTER — Ambulatory Visit: Payer: Medicare HMO

## 2020-02-02 ENCOUNTER — Other Ambulatory Visit: Payer: Self-pay

## 2020-02-02 ENCOUNTER — Ambulatory Visit: Payer: Medicare HMO | Admitting: Physical Therapy

## 2020-02-02 ENCOUNTER — Encounter: Payer: Self-pay | Admitting: Physical Therapy

## 2020-02-02 DIAGNOSIS — R278 Other lack of coordination: Secondary | ICD-10-CM | POA: Diagnosis not present

## 2020-02-02 DIAGNOSIS — R2689 Other abnormalities of gait and mobility: Secondary | ICD-10-CM | POA: Diagnosis not present

## 2020-02-02 DIAGNOSIS — R2681 Unsteadiness on feet: Secondary | ICD-10-CM | POA: Diagnosis not present

## 2020-02-02 DIAGNOSIS — M6281 Muscle weakness (generalized): Secondary | ICD-10-CM | POA: Diagnosis not present

## 2020-02-03 NOTE — Therapy (Signed)
[facility] [address] [place], [place], [address] Phone: [phone]   Fax:  [phone]  Physical Therapy Treatment  Patient Details  Name: [name] MRN: [id number] Date of Birth: [birth year] Referring Provider (PT): Clarene Reamer, FNP   Encounter Date: 02/02/2020   PT End of Session - 02/02/20 1236    Visit Number 8    Number of Visits 13    Date for PT Re-Evaluation 04/03/20   POC for 6 weeks, Cert for 90 days   Authorization Type A[employer]na Medicare (10th Visit PN)    Progress Note Due on Visit 10    PT Start Time 1233    PT Stop Time 1315    PT Time Calculation (min) 42 min    Equipment Utilized During Treatment Gait belt    Activity Tolerance Patient tolerated treatment well    Behavior During Therapy [facility] for tasks assessed/performed;Flat affect           Past Medical History:  Diagnosis Date  . Allergy   . Anxiety   . Arnold-Chiari malformation ([place])   . Arthritis   . CHF (congestive heart failure) ([place])   . Chronic systolic heart failure ([place]) 11/03/2019  . Coronary artery disease   . Encounter for assessment of implantable cardioverter-defibrillator (ICD) 11/10/2019  . GERD (gastroesophageal reflux disease)   . H. pylori infection    3 years ago  . H/O cardiac arrest    Hx of VT/Vfib arrest  . Hyperlipidemia   . ICD BiV Medtronic model [id number] Claria Quad CRT-D SureScan ICD 08/25/2019   . Incontinence   . Paroxysmal atrial fibrillation ([place]) 08/06/2019  . Syncope and collapse    Per pt, denies passing out  . Tobacco use disorder   . Unspecified essential hypertension     Past Surgical History:  Procedure Laterality Date  . APPENDECTOMY    . ARNOLD CHIARI SURGERY     neurocranial surgery  . BIV ICD INSERTION CRT-D N/A 08/25/2019   Procedure: BIV ICD INSERTION CRT-D;  Surgeon: Constance Haw, MD;  Location: [facility];  Service: Cardiovascular;  Laterality: N/A;  .  BLEPHAROPLASTY     Bil  . C-EYE SURGERY PROCEDURE    . CARDIAC CATHETERIZATION    . CHOLECYSTECTOMY    . EYE SURGERY    . heart failure    . LEFT HEART CATH AND CORONARY ANGIOGRAPHY N/A 08/04/2019   Procedure: LEFT HEART CATH AND CORONARY ANGIOGRAPHY;  Surgeon: Adrian Prows, MD;  Location: [facility];  Service: Cardiovascular;  Laterality: N/A;  . MASS EXCISION Right 12/27/2013   Procedure: MINOR EXCISION OF RIGHT THUMB MUCOID CYST, DEBRIDEMENT OF INTERPHALANGEAL JOINT;  Surgeon: Cammie Sickle, MD;  Location: [facility];  Service: Orthopedics;  Laterality: Right;  . mass on thumb  right  . TOTAL KNEE ARTHROPLASTY Right 02/17/2017  . TOTAL KNEE ARTHROPLASTY Right 02/17/2017   Procedure: TOTAL KNEE ARTHROPLASTY;  Surgeon: Melrose Nakayama, MD;  Location: [facility];  Service: Orthopedics;  Laterality: Right;  . TUBAL LIGATION      There were no vitals filed for this visit.   Subjective Assessment - 02/02/20 1234    Subjective No new complaints. Does have a mild headache due to allergies, took some tylenol and an allergy pill.  No falls.    Patient is accompained by: Family member   her friend, Tye [place], in lobby   Pertinent History PMH includes anxiety, HTN, Chiari I  malformation, a fib, CKD stage III, HLD, hx of R knee replacement, hx of sciatica for several years, Acute Ischemic CVA    Limitations Walking;Standing    Patient Stated Goals Improve strength/endurance    Currently in Pain? No/denies    Pain Score 0-No pain              [facility] Adult PT Treatment/Exercise - 02/02/20 1236      Transfers   Transfers Sit to Stand;Stand to Sit    Sit to Stand 5: Supervision;Without upper extremity assist;From chair/3-in-1    Stand to Sit 5: Supervision;Without upper extremity assist;To chair/3-in-1      Ambulation/Gait   Ambulation/Gait Yes    Ambulation/Gait Assistance 5: Supervision    Ambulation/Gait Assistance Details around gym with session    Assistive device None     Gait Pattern Step-through pattern    Ambulation Surface Level;Indoor      High Level Balance   High Level Balance Activities Side stepping;Marching forwards;Marching backwards   tandem gait fwd/bwd   High Level Balance Comments on blue mat in parallel bars: 4 laps with no to light UE support, cues on posture and ex form/technique.       Neuro Re-ed    Neuro Re-ed Details  for balance/muscle re-ed: gait around track working on speed changes, scanning enviroment, and sudden stops/starts with min guard assist. no signifcant balance loss noted, minor veering with scanning.       Knee/Hip Exercises: Aerobic   Other Aerobic Scifit UE/LE's on level 2.0 for 6 minutes with goal >/= 60 rpm for strengthening and activity tolerance. BP after 146/73.      Knee/Hip Exercises: Standing   Lateral Step Up Both;1 set;10 reps;Hand Hold: 0;Step Height: 6";Limitations   8 reps with left LE due to sciatica pain   Lateral Step Up Limitations cues on form and technique    Forward Step Up Both;1 set;10 reps;Hand Hold: 1;Step Height: 6";Limitations    Forward Step Up Limitations cues on form and technique                    PT Short Term Goals - 01/24/20 1237      PT SHORT TERM GOAL #1   Title Pt will be independent with initial HEP to improve balance/BLE strength    Baseline pt performing HEP - needs intermittent verbal cues for technique    Time 3    Period Weeks    Status Partially Met    Target Date 01/25/20      PT SHORT TERM GOAL #2   Title Patient will improve 5x sit <> stand to </= 20 seconds to demonstrate improved functional strength    Baseline 14 seconds with no UE support from standard height chair on 01/24/20    Time 3    Period Weeks    Status Achieved    Target Date 01/25/20             PT Long Term Goals - 01/04/20 1629      PT LONG TERM GOAL #1   Title Pt will be independent with final HEP for BLE strenghtening/balance ( ALL LTGS DUE 02/15/2020)    Baseline no HEP  at this time    Time 6    Period Weeks    Status New    Target Date 02/15/20      PT LONG TERM GOAL #2   Title Patient will improve FGA by 5 points from baseline to demo  decreased fall risk    Baseline not yet assessed    Time 6    Period Weeks    Status New    Target Date 02/15/20      PT LONG TERM GOAL #3   Title Patient will ambulate 1,200 ft in 6 minutes w/o AD to demo improved endurance with walking    Baseline 851    Time 6    Period Weeks    Status New      PT LONG TERM GOAL #4   Title Patient will demo improved 5x sit <> stand to </= 15 seconds without UE support for improved functional BLE strength    Baseline 22.03    Time 6    Period Weeks    Status New                 Plan - 02/02/20 1236    Clinical Impression Statement Today's skilled session focused on strengthening, dynamic gait and balance reactions on compliant surfaces. No issues noted or reported with 3 rest breaks needed. The pt is progressing toward goals and should benefit from continued PT to progress toward unmet goals.    Personal Factors and Comorbidities Comorbidity 3+    Comorbidities anxiety, HTN, Chiari I malformation, a fib, CKD stage III, HLD, hx of R knee replacement, hx of sciatica for several years, Acute Ischemic CVA    Examination-Activity Limitations Squat;Stairs;Stand    Examination-Participation Restrictions Community Activity;Yard Work;Driving    Stability/Clinical Decision Making Evolving/Moderate complexity    Rehab Potential Good    PT Frequency 2x / week    PT Duration 6 weeks    PT Treatment/Interventions ADLs/Self Care Home Management;Cryotherapy;Electrical Stimulation;Moist Heat;DME Instruction;Gait training;Stair training;Functional mobility training;Therapeutic activities;Therapeutic exercise;Balance training;Neuromuscular re-education;Patient/family education;Manual techniques;Passive range of motion;Energy conservation    PT Next Visit Plan Continue standing balance  - LLE SLS. Continues standing strengthening activites, endurance activites (scifit/nustep)    PT Home Exercise Plan [email or web address]    Consulted and Agree with Plan of Care Patient           Patient will benefit from skilled therapeutic intervention in order to improve the following deficits and impairments:  Abnormal gait, Decreased balance, Decreased endurance, Decreased range of motion, Decreased activity tolerance, Decreased strength, Difficulty walking, Pain, Postural dysfunction  Visit Diagnosis: Unsteadiness on feet  Muscle weakness (generalized)  Other abnormalities of gait and mobility     Problem List Patient Active Problem List   Diagnosis Date Noted  . Encounter for assessment of implantable cardioverter-defibrillator (ICD) 11/10/2019  . H/O cardiac arrest 08/03/2019 11/10/2019  . Coronary artery disease involving native coronary artery of native heart without angina pectoris 11/03/2019  . Benign hypertensive CKD, stage 1-4 or unspecified chronic kidney disease 11/03/2019  . Chronic systolic heart failure ([place]) 11/03/2019  . Lightheadedness 11/03/2019  . CHF (congestive heart failure) ([place]) 10/18/2019  . Pruritus 09/01/2019  . ICD BiV Medtronic model [id number] Claria Quad CRT-D SureScan ICD 08/25/2019   . Cardiac arrest with ventricular fibrillation ([place]) 08/24/2019  . TIA (transient ischemic attack) 08/15/2019  . Long term current use of anticoagulant 08/14/2019  . Cardiomyopathy ([place]) 08/14/2019  . Paroxysmal atrial fibrillation ([place]) 08/06/2019  . Statin intolerance 08/03/2019  . Dysphagia   . History of CVA (cerebrovascular accident) 08/02/2019  . Hypokalemia 08/02/2019  . Left bundle branch block 08/02/2019  . CKD (chronic kidney disease) stage 3, GFR 30-59 ml/min 12/13/2018  . Primary localized osteoarthritis of right knee 02/17/2017  . Primary osteoarthritis  of right knee 02/17/2017  . Preventative health care 09/01/2016  . Vitamin B12 deficiency 09/10/2015  .  Insomnia 07/16/2015  . GERD (gastroesophageal reflux disease) 03/26/2015  . Fatigue 10/02/2014  . Other malaise and fatigue 02/08/2014  . HLD (hyperlipidemia) 11/19/2011  . Vaginal dryness, menopausal 11/19/2011  . Adjustment disorder 01/08/2011  . Former smoker 12/19/2008  . Essential hypertension 12/19/2008    Willow Ora, PTA, [facility] [address] [place], [place] [address] [phone] 02/03/20, 12:22 PM   Name: [name] MRN: [id number] Date of Birth: 04-05-1951

## 2020-02-07 ENCOUNTER — Ambulatory Visit: Payer: Medicare HMO | Admitting: Physical Therapy

## 2020-02-07 ENCOUNTER — Other Ambulatory Visit: Payer: Self-pay

## 2020-02-07 VITALS — BP 145/77 | HR 83

## 2020-02-07 DIAGNOSIS — M6281 Muscle weakness (generalized): Secondary | ICD-10-CM

## 2020-02-07 DIAGNOSIS — R2689 Other abnormalities of gait and mobility: Secondary | ICD-10-CM

## 2020-02-07 DIAGNOSIS — R278 Other lack of coordination: Secondary | ICD-10-CM

## 2020-02-07 DIAGNOSIS — R2681 Unsteadiness on feet: Secondary | ICD-10-CM | POA: Diagnosis not present

## 2020-02-07 NOTE — Therapy (Signed)
[facility] [address] [place], [place], [address] Phone: [phone]   Fax:  [phone]  Physical Therapy Treatment  Patient Details  Name: [name] MRN: [id number] Date of Birth: [birth year] Referring Provider (PT): Clarene Reamer, FNP   Encounter Date: 02/07/2020   PT End of Session - 02/07/20 1438    Visit Number 9    Number of Visits 13    Date for PT Re-Evaluation 04/03/20   POC for 6 weeks, Cert for 90 days   Authorization Type [employer] Medicare (10th Visit PN)    Progress Note Due on Visit 10    PT Start Time 1232    PT Stop Time 1311    PT Time Calculation (min) 39 min    Equipment Utilized During Treatment Gait belt    Activity Tolerance Patient tolerated treatment well    Behavior During Therapy [facility] for tasks assessed/performed;Flat affect           Past Medical History:  Diagnosis Date  . Allergy   . Anxiety   . Arnold-Chiari malformation ([place])   . Arthritis   . CHF (congestive heart failure) ([place])   . Chronic systolic heart failure ([place]) 11/03/2019  . Coronary artery disease   . Encounter for assessment of implantable cardioverter-defibrillator (ICD) 11/10/2019  . GERD (gastroesophageal reflux disease)   . H. pylori infection    3 years ago  . H/O cardiac arrest    Hx of VT/Vfib arrest  . Hyperlipidemia   . ICD BiV Medtronic model [id number] Claria Quad CRT-D SureScan ICD 08/25/2019   . Incontinence   . Paroxysmal atrial fibrillation ([place]) 08/06/2019  . Syncope and collapse    Per pt, denies passing out  . Tobacco use disorder   . Unspecified essential hypertension     Past Surgical History:  Procedure Laterality Date  . APPENDECTOMY    . ARNOLD CHIARI SURGERY     neurocranial surgery  . BIV ICD INSERTION CRT-D N/A 08/25/2019   Procedure: BIV ICD INSERTION CRT-D;  Surgeon: Constance Haw, MD;  Location: [facility];  Service: Cardiovascular;  Laterality: N/A;  .  BLEPHAROPLASTY     Bil  . C-EYE SURGERY PROCEDURE    . CARDIAC CATHETERIZATION    . CHOLECYSTECTOMY    . EYE SURGERY    . heart failure    . LEFT HEART CATH AND CORONARY ANGIOGRAPHY N/A 08/04/2019   Procedure: LEFT HEART CATH AND CORONARY ANGIOGRAPHY;  Surgeon: Adrian Prows, MD;  Location: [facility];  Service: Cardiovascular;  Laterality: N/A;  . MASS EXCISION Right 12/27/2013   Procedure: MINOR EXCISION OF RIGHT THUMB MUCOID CYST, DEBRIDEMENT OF INTERPHALANGEAL JOINT;  Surgeon: Cammie Sickle, MD;  Location: [facility];  Service: Orthopedics;  Laterality: Right;  . mass on thumb  right  . TOTAL KNEE ARTHROPLASTY Right 02/17/2017  . TOTAL KNEE ARTHROPLASTY Right 02/17/2017   Procedure: TOTAL KNEE ARTHROPLASTY;  Surgeon: Melrose Nakayama, MD;  Location: [facility];  Service: Orthopedics;  Laterality: Right;  . TUBAL LIGATION      Vitals:   02/07/20 1236  BP: (!) 145/77  Pulse: 83     Subjective Assessment - 02/07/20 1234    Subjective No changes - exercises at home are going well. Has a little bit of an upset stomach this morning.    Patient is accompained by: Family member   her friend, Tye Maryland, in lobby   Pertinent History PMH includes  anxiety, HTN, Chiari I malformation, a fib, CKD stage III, HLD, hx of R knee replacement, hx of sciatica for several years, Acute Ischemic CVA    Limitations Walking;Standing    Patient Stated Goals Improve strength/endurance    Currently in Pain? No/denies                             [facility] Adult PT Treatment/Exercise - 02/07/20 1242      Knee/Hip Exercises: Aerobic   Stepper With BLE and BUE for strengthening and activity tolerance at gear 2.0 for 5 minutes    Other Aerobic discussed community fitness/aerobic activity post discharge - pt reporting that she is going to be looking into joining a nearby [employer] - 02/07/20 0001      Balance Exercises: Standing   Rockerboard  Anterior/posterior;Lateral;EO;EC;Limitations    Rockerboard Limitations x10 reps weightshift A/P with no UE support with min guard then eyes closed multiple reps from 10-15 seconds each, with last rep able to perform for 30 seconds, min guard/min A for balance    Tandem Gait Forward;Retro;3 reps;Limitations    Tandem Gait Limitations on blue mat, intermittent UE support for retro gait    Step Over Hurdles / Cones on blue compliant mat: stepping over taller orange hurdle with RLE for SLS on LLE x10 reps, cues for slowed and controlled and incr hip/knee flexion on R    Other Standing Exercises alternating SLS and marching on blue side of BOSU with BUE support x10 reps B, cues for incr hip/knee flexion and decr UE support to fingertip with incr reps. on blue side of BOSU: standing on LLE with slight bend in L knee x10 reps R hip ABD for closed chain LLE hip ABD and SLS    Other Standing Exercises Comments SLS on rockerboard in A/P direction: alternating forward and then cross body cone tap x10 reps B, intermittent UE support, then stepping onto rockerboard with single leg and tapping single cone with contralateral leg x8 reps B, cues for slowed and controlled             PT Education - 02/07/20 1438    Education Details pt to look into the [employer] to continue with aerobic exercise post discharge    Person(s) Educated Patient    Methods Explanation    Comprehension Verbalized understanding            PT Short Term Goals - 01/24/20 1237      PT SHORT TERM GOAL #1   Title Pt will be independent with initial HEP to improve balance/BLE strength    Baseline pt performing HEP - needs intermittent verbal cues for technique    Time 3    Period Weeks    Status Partially Met    Target Date 01/25/20      PT SHORT TERM GOAL #2   Title Patient will improve 5x sit <> stand to </= 20 seconds to demonstrate improved functional strength    Baseline 14 seconds with no UE support from standard height chair  on 01/24/20    Time 3    Period Weeks    Status Achieved    Target Date 01/25/20             PT Long Term Goals - 01/04/20 1629      PT LONG TERM GOAL #1  Title Pt will be independent with final HEP for BLE strenghtening/balance ( ALL LTGS DUE 02/15/2020)    Baseline no HEP at this time    Time 6    Period Weeks    Status New    Target Date 02/15/20      PT LONG TERM GOAL #2   Title Patient will improve FGA by 5 points from baseline to demo decreased fall risk    Baseline not yet assessed    Time 6    Period Weeks    Status New    Target Date 02/15/20      PT LONG TERM GOAL #3   Title Patient will ambulate 1,200 ft in 6 minutes w/o AD to demo improved endurance with walking    Baseline 851    Time 6    Period Weeks    Status New      PT LONG TERM GOAL #4   Title Patient will demo improved 5x sit <> stand to </= 15 seconds without UE support for improved functional BLE strength    Baseline 22.03    Time 6    Period Weeks    Status New                 Plan - 02/07/20 1445    Clinical Impression Statement Today's skilled session continued to focus on BLE strengthening and balance reactions on compliant surfaces. Pt tolerated session well with intermittent rest breaks as needed. Pt is going to look into joining the [employer] to continue with aerobic exercise s/p discharge.    Personal Factors and Comorbidities Comorbidity 3+    Comorbidities anxiety, HTN, Chiari I malformation, a fib, CKD stage III, HLD, hx of R knee replacement, hx of sciatica for several years, Acute Ischemic CVA    Examination-Activity Limitations Squat;Stairs;Stand    Examination-Participation Restrictions Community Activity;Yard Work;Driving    Stability/Clinical Decision Making Evolving/Moderate complexity    Rehab Potential Good    PT Frequency 2x / week    PT Duration 6 weeks    PT Treatment/Interventions ADLs/Self Care Home Management;Cryotherapy;Electrical Stimulation;Moist Heat;DME  Instruction;Gait training;Stair training;Functional mobility training;Therapeutic activities;Therapeutic exercise;Balance training;Neuromuscular re-education;Patient/family education;Manual techniques;Passive range of motion;Energy conservation    PT Next Visit Plan did pt look into going to the [facility]? Continue standing balance - LLE SLS. Continues standing strengthening activites, endurance activites (scifit/nustep)    PT Home Exercise Plan [email or web address]    Consulted and Agree with Plan of Care Patient           Patient will benefit from skilled therapeutic intervention in order to improve the following deficits and impairments:  Abnormal gait, Decreased balance, Decreased endurance, Decreased range of motion, Decreased activity tolerance, Decreased strength, Difficulty walking, Pain, Postural dysfunction  Visit Diagnosis: Unsteadiness on feet  Muscle weakness (generalized)  Other abnormalities of gait and mobility  Other lack of coordination     Problem List Patient Active Problem List   Diagnosis Date Noted  . Encounter for assessment of implantable cardioverter-defibrillator (ICD) 11/10/2019  . H/O cardiac arrest 08/03/2019 11/10/2019  . Coronary artery disease involving native coronary artery of native heart without angina pectoris 11/03/2019  . Benign hypertensive CKD, stage 1-4 or unspecified chronic kidney disease 11/03/2019  . Chronic systolic heart failure ([place]) 11/03/2019  . Lightheadedness 11/03/2019  . CHF (congestive heart failure) ([place]) 10/18/2019  . Pruritus 09/01/2019  . ICD BiV Medtronic model [id number] Claria Quad CRT-D SureScan ICD 08/25/2019   . Cardiac arrest with ventricular fibrillation (  [place]) 08/24/2019  . TIA (transient ischemic attack) 08/15/2019  . Long term current use of anticoagulant 08/14/2019  . Cardiomyopathy ([place]) 08/14/2019  . Paroxysmal atrial fibrillation ([place]) 08/06/2019  . Statin intolerance 08/03/2019  . Dysphagia   . History of CVA  (cerebrovascular accident) 08/02/2019  . Hypokalemia 08/02/2019  . Left bundle branch block 08/02/2019  . CKD (chronic kidney disease) stage 3, GFR 30-59 ml/min 12/13/2018  . Primary localized osteoarthritis of right knee 02/17/2017  . Primary osteoarthritis of right knee 02/17/2017  . Preventative health care 09/01/2016  . Vitamin B12 deficiency 09/10/2015  . Insomnia 07/16/2015  . GERD (gastroesophageal reflux disease) 03/26/2015  . Fatigue 10/02/2014  . Other malaise and fatigue 02/08/2014  . HLD (hyperlipidemia) 11/19/2011  . Vaginal dryness, menopausal 11/19/2011  . Adjustment disorder 01/08/2011  . Former smoker 12/19/2008  . Essential hypertension 12/19/2008    Arliss Journey, PT, DPT  02/07/2020, 2:49 PM  [facility] [address] [place], [place], [address] Phone: [phone]   Fax:  [phone]  Name: LAKESHIA DOHNER MRN: [id number] Date of Birth: April 16, 1951

## 2020-02-09 ENCOUNTER — Other Ambulatory Visit: Payer: Self-pay

## 2020-02-09 ENCOUNTER — Ambulatory Visit: Payer: Medicare HMO

## 2020-02-09 DIAGNOSIS — R278 Other lack of coordination: Secondary | ICD-10-CM | POA: Diagnosis not present

## 2020-02-09 DIAGNOSIS — R2681 Unsteadiness on feet: Secondary | ICD-10-CM | POA: Diagnosis not present

## 2020-02-09 DIAGNOSIS — M6281 Muscle weakness (generalized): Secondary | ICD-10-CM

## 2020-02-09 DIAGNOSIS — R2689 Other abnormalities of gait and mobility: Secondary | ICD-10-CM | POA: Diagnosis not present

## 2020-02-09 NOTE — Patient Instructions (Signed)
Access Code: [email or web address] URL: [email or web address] Date: 02/09/2020 Prepared by: Baldomero Lamy  Exercises Sit to Stand with Resistance Around Legs - 1 x daily - 7 x weekly - 3 sets - 10 reps Standing Hip Abduction with Counter Support - 1 x daily - 7 x weekly - 10 reps - 3 sets Standing Hip Extension with Counter Support - 1 x daily - 7 x weekly - 10 reps - 3 sets Heel Toe Raises with Counter Support - 1 x daily - 7 x weekly - 3 sets - 10 reps Side Stepping with Resistance at Thighs and Counter Support - 1 x daily - 7 x weekly - 10 reps - 3 sets Standing Marching - 1 x daily - 7 x weekly - 3 sets - 10 reps Romberg Stance Eyes Closed on Foam Pad - 1 x daily - 5 x weekly - 1 sets - 3 reps - 30 hold Romberg Stance on Foam Pad with Head Rotation - 1 x daily - 5 x weekly - 2 sets - 10 reps Romberg Stance with Head Nods on Foam Pad - 1 x daily - 5 x weekly - 2 sets - 10 reps Tandem Stance - 1 x daily - 5 x weekly - 1 sets - 3 reps - 30 hold Tandem Walking with Counter Support - 1 x daily - 5 x weekly - 3 sets - 10 reps

## 2020-02-09 NOTE — Therapy (Signed)
[facility] [address] [place], [place], [address] Phone: [phone]   Fax:  [phone]  Physical Therapy Treatment/Progress Note  Patient Details  Name: [name] MRN: [id number] Date of Birth: 1950/07/02 Referring Provider (PT): Clarene Reamer, FNP  Physical Therapy Progress Note  Dates of Reporting Period: 01/04/20- 02/09/20  See Note below for Objective Data and Assessment of Progress/Goals.  Thank you for the referral of this patient. [place], PT, DPT   Encounter Date: 02/09/2020   PT End of Session - 02/09/20 1150    Visit Number 10    Number of Visits 13    Date for PT Re-Evaluation 04/03/20   POC for 6 weeks, Cert for 90 days   Authorization Type [employer] Medicare (10th Visit PN)    Progress Note Due on Visit 10    PT Start Time 1145    PT Stop Time 1227    PT Time Calculation (min) 42 min    Equipment Utilized During Treatment Gait belt    Activity Tolerance Patient tolerated treatment well    Behavior During Therapy WFL for tasks assessed/performed;Flat affect           Past Medical History:  Diagnosis Date  . Allergy   . Anxiety   . Arnold-Chiari malformation ([place])   . Arthritis   . CHF (congestive heart failure) ([place])   . Chronic systolic heart failure ([place]) 11/03/2019  . Coronary artery disease   . Encounter for assessment of implantable cardioverter-defibrillator (ICD) 11/10/2019  . GERD (gastroesophageal reflux disease)   . H. pylori infection    3 years ago  . H/O cardiac arrest    Hx of VT/Vfib arrest  . Hyperlipidemia   . ICD BiV Medtronic model [id number] Claria Quad CRT-D SureScan ICD 08/25/2019   . Incontinence   . Paroxysmal atrial fibrillation ([place]) 08/06/2019  . Syncope and collapse    Per pt, denies passing out  . Tobacco use disorder   . Unspecified essential hypertension     Past Surgical History:  Procedure Laterality Date  . APPENDECTOMY    . ARNOLD CHIARI  SURGERY     neurocranial surgery  . BIV ICD INSERTION CRT-D N/A 08/25/2019   Procedure: BIV ICD INSERTION CRT-D;  Surgeon: Constance Haw, MD;  Location: [facility];  Service: Cardiovascular;  Laterality: N/A;  . BLEPHAROPLASTY     Bil  . C-EYE SURGERY PROCEDURE    . CARDIAC CATHETERIZATION    . CHOLECYSTECTOMY    . EYE SURGERY    . heart failure    . LEFT HEART CATH AND CORONARY ANGIOGRAPHY N/A 08/04/2019   Procedure: LEFT HEART CATH AND CORONARY ANGIOGRAPHY;  Surgeon: Adrian Prows, MD;  Location: [facility];  Service: Cardiovascular;  Laterality: N/A;  . MASS EXCISION Right 12/27/2013   Procedure: MINOR EXCISION OF RIGHT THUMB MUCOID CYST, DEBRIDEMENT OF INTERPHALANGEAL JOINT;  Surgeon: Cammie Sickle, MD;  Location: [facility];  Service: Orthopedics;  Laterality: Right;  . mass on thumb  right  . TOTAL KNEE ARTHROPLASTY Right 02/17/2017  . TOTAL KNEE ARTHROPLASTY Right 02/17/2017   Procedure: TOTAL KNEE ARTHROPLASTY;  Surgeon: Melrose Nakayama, MD;  Location: [facility];  Service: Orthopedics;  Laterality: Right;  . TUBAL LIGATION      There were no vitals filed for this visit.   Subjective Assessment - 02/09/20 1149    Subjective Patient no new changes/complaints. No pain.    Patient  is accompained by: Family member   her friend, Tye Maryland, in lobby   Pertinent History PMH includes anxiety, HTN, Chiari I malformation, a fib, CKD stage III, HLD, hx of R knee replacement, hx of sciatica for several years, Acute Ischemic CVA    Limitations Walking;Standing    Patient Stated Goals Improve strength/endurance    Currently in Pain? No/denies            Planning to Discharge at Next Visit. Reviewed and Updated, including progression of exercises as tolerated by patient. Completed all of the following exercises and progressed including increased resistance (via therabands), as well as sets/repetitions. PT educating on splitting exercises and alternating days,  completed strengthening exercises on one day and following with balance exercises next day. Continued education on obtaining [employer] membership to allow for further participation in aerobic exercise upon discharge. Patient able to demo good technique with all exercises. Educated to complete balance activities in corner with chair placed in front for improved safety   Access Code: [email or web address] URL: [email or web address] Date: 02/09/2020 Prepared by: Baldomero Lamy  Exercises Sit to Stand with Resistance Around Legs - 1 x daily - 7 x weekly - 3 sets - 10 reps Standing Hip Abduction with Counter Support - 1 x daily - 7 x weekly - 10 reps - 3 sets Standing Hip Extension with Counter Support - 1 x daily - 7 x weekly - 10 reps - 3 sets Heel Toe Raises with Counter Support - 1 x daily - 7 x weekly - 3 sets - 10 reps Side Stepping with Resistance at Thighs and Counter Support - 1 x daily - 7 x weekly - 10 reps - 3 sets Standing Marching - 1 x daily - 7 x weekly - 3 sets - 10 reps Romberg Stance Eyes Closed on Foam Pad - 1 x daily - 5 x weekly - 1 sets - 3 reps - 30 hold Romberg Stance on Foam Pad with Head Rotation - 1 x daily - 5 x weekly - 2 sets - 10 reps Romberg Stance with Head Nods on Foam Pad - 1 x daily - 5 x weekly - 2 sets - 10 reps Tandem Stance - 1 x daily - 5 x weekly - 1 sets - 3 reps - 30 hold Tandem Walking with Counter Support - 1 x daily - 5 x weekly - 3 sets - 10 reps        [facility] Adult PT Treatment/Exercise - 02/09/20 0001      Ambulation/Gait   Ambulation/Gait Yes    Ambulation/Gait Assistance 6: Modified independent (Device/Increase time)    Ambulation/Gait Assistance Details throughout therapy gym with activites    Assistive device None    Gait Pattern Step-through pattern    Ambulation Surface Level;Indoor               Balance Exercises - 02/09/20 0001      Balance Exercises: Standing   Standing Eyes Opened Narrow base of support (BOS);Head  turns;Foam/compliant surface;Limitations    Standing Eyes Opened Limitations 2 x 10 reps of horizontal/vertical head turns    Standing Eyes Closed Narrow base of support (BOS);Foam/compliant surface;3 reps;30 secs    Tandem Stance Eyes open;Intermittent upper extremity support;3 reps;30 secs    Tandem Gait Forward;3 reps;Limitations    Tandem Gait Limitations completed at countertop with light UE support             PT Education - 02/09/20 1227    Education Details HEP Update (  see patient instructions); Starting at [facility] for aerobic exercise    Person(s) Educated Patient    Methods Explanation;Demonstration;Handout    Comprehension Verbalized understanding;Returned demonstration            PT Short Term Goals - 01/24/20 1237      PT SHORT TERM GOAL #1   Title Pt will be independent with initial HEP to improve balance/BLE strength    Baseline pt performing HEP - needs intermittent verbal cues for technique    Time 3    Period Weeks    Status Partially Met    Target Date 01/25/20      PT SHORT TERM GOAL #2   Title Patient will improve 5x sit <> stand to </= 20 seconds to demonstrate improved functional strength    Baseline 14 seconds with no UE support from standard height chair on 01/24/20    Time 3    Period Weeks    Status Achieved    Target Date 01/25/20             PT Long Term Goals - 01/04/20 1629      PT LONG TERM GOAL #1   Title Pt will be independent with final HEP for BLE strenghtening/balance ( ALL LTGS DUE 02/15/2020)    Baseline no HEP at this time    Time 6    Period Weeks    Status New    Target Date 02/15/20      PT LONG TERM GOAL #2   Title Patient will improve FGA by 5 points from baseline to demo decreased fall risk    Baseline not yet assessed    Time 6    Period Weeks    Status New    Target Date 02/15/20      PT LONG TERM GOAL #3   Title Patient will ambulate 1,200 ft in 6 minutes w/o AD to demo improved endurance with walking     Baseline 851    Time 6    Period Weeks    Status New      PT LONG TERM GOAL #4   Title Patient will demo improved 5x sit <> stand to </= 15 seconds without UE support for improved functional BLE strength    Baseline 22.03    Time 6    Period Weeks    Status New                 Plan - 02/09/20 1410    Clinical Impression Statement Today's skilled session included complete review of current HEP and progressed. Patient has demonstrated improved activity tolerance and able to progress exercises via increased resistance or sets/reps. Patient is expected to be ready for discharge next visit due to progress achieved with PT services. Will continue to progress toward goals.    Personal Factors and Comorbidities Comorbidity 3+    Comorbidities anxiety, HTN, Chiari I malformation, a fib, CKD stage III, HLD, hx of R knee replacement, hx of sciatica for several years, Acute Ischemic CVA    Examination-Activity Limitations Squat;Stairs;Stand    Examination-Participation Restrictions Community Activity;Yard Work;Driving    Stability/Clinical Decision Making Evolving/Moderate complexity    Rehab Potential Good    PT Frequency 2x / week    PT Duration 6 weeks    PT Treatment/Interventions ADLs/Self Care Home Management;Cryotherapy;Electrical Stimulation;Moist Heat;DME Instruction;Gait training;Stair training;Functional mobility training;Therapeutic activities;Therapeutic exercise;Balance training;Neuromuscular re-education;Patient/family education;Manual techniques;Passive range of motion;Energy conservation    PT Next Visit Plan check LTG + Discharge  PT Home Exercise Plan [email or web address]    Consulted and Agree with Plan of Care Patient           Patient will benefit from skilled therapeutic intervention in order to improve the following deficits and impairments:  Abnormal gait, Decreased balance, Decreased endurance, Decreased range of motion, Decreased activity tolerance, Decreased strength,  Difficulty walking, Pain, Postural dysfunction  Visit Diagnosis: Unsteadiness on feet  Muscle weakness (generalized)  Other abnormalities of gait and mobility     Problem List Patient Active Problem List   Diagnosis Date Noted  . Encounter for assessment of implantable cardioverter-defibrillator (ICD) 11/10/2019  . H/O cardiac arrest 08/03/2019 11/10/2019  . Coronary artery disease involving native coronary artery of native heart without angina pectoris 11/03/2019  . Benign hypertensive CKD, stage 1-4 or unspecified chronic kidney disease 11/03/2019  . Chronic systolic heart failure ([place]) 11/03/2019  . Lightheadedness 11/03/2019  . CHF (congestive heart failure) ([place]) 10/18/2019  . Pruritus 09/01/2019  . ICD BiV Medtronic model [id number] Claria Quad CRT-D SureScan ICD 08/25/2019   . Cardiac arrest with ventricular fibrillation ([place]) 08/24/2019  . TIA (transient ischemic attack) 08/15/2019  . Long term current use of anticoagulant 08/14/2019  . Cardiomyopathy ([place]) 08/14/2019  . Paroxysmal atrial fibrillation ([place]) 08/06/2019  . Statin intolerance 08/03/2019  . Dysphagia   . History of CVA (cerebrovascular accident) 08/02/2019  . Hypokalemia 08/02/2019  . Left bundle branch block 08/02/2019  . CKD (chronic kidney disease) stage 3, GFR 30-59 ml/min 12/13/2018  . Primary localized osteoarthritis of right knee 02/17/2017  . Primary osteoarthritis of right knee 02/17/2017  . Preventative health care 09/01/2016  . Vitamin B12 deficiency 09/10/2015  . Insomnia 07/16/2015  . GERD (gastroesophageal reflux disease) 03/26/2015  . Fatigue 10/02/2014  . Other malaise and fatigue 02/08/2014  . HLD (hyperlipidemia) 11/19/2011  . Vaginal dryness, menopausal 11/19/2011  . Adjustment disorder 01/08/2011  . Former smoker 12/19/2008  . Essential hypertension 12/19/2008    Jones Bales, PT, DPT 02/09/2020, 2:12 PM  [facility] [address] [place], [place], [address] Phone: [phone]   Fax:  [phone]  Name: [name] MRN: [id number] Date of Birth: January 11, 1951

## 2020-02-13 ENCOUNTER — Ambulatory Visit: Payer: Medicare HMO

## 2020-02-13 ENCOUNTER — Other Ambulatory Visit: Payer: Self-pay

## 2020-02-13 DIAGNOSIS — M6281 Muscle weakness (generalized): Secondary | ICD-10-CM

## 2020-02-13 DIAGNOSIS — R2689 Other abnormalities of gait and mobility: Secondary | ICD-10-CM

## 2020-02-13 DIAGNOSIS — R2681 Unsteadiness on feet: Secondary | ICD-10-CM

## 2020-02-13 DIAGNOSIS — R278 Other lack of coordination: Secondary | ICD-10-CM | POA: Diagnosis not present

## 2020-02-13 NOTE — Therapy (Addendum)
[facility] [address] [place], [place], [address] Phone: [phone]   Fax:  [phone]  Physical Therapy Treatment/Discharge Summary   Patient Details  Name: [name] MRN: [id number] Date of Birth: October 08, 1950 Referring Provider (PT): Clarene Reamer, FNP  PHYSICAL THERAPY DISCHARGE SUMMARY  Visits from Start of Care: 11  Current functional level related to goals / functional outcomes: See Clinical Impression Statement for Details   Remaining deficits: Medium Fall Risk   Education / Equipment: Educated on HEP and aerobic activity at [employer]: Patient agrees to discharge.  Patient goals were partially met. Patient is being discharged due to meeting the stated rehab goals.  ?????           Encounter Date: 02/13/2020   PT End of Session - 02/13/20 1236    Visit Number 11    Number of Visits 13    Date for PT Re-Evaluation 04/03/20   POC for 6 weeks, Cert for 90 days   Authorization Type [employer] (10th Visit PN)    Progress Note Due on Visit 10    PT Start Time 1232    PT Stop Time 1310    PT Time Calculation (min) 38 min    Equipment Utilized During Treatment Gait belt    Activity Tolerance Patient tolerated treatment well    Behavior During Therapy WFL for tasks assessed/performed;Flat affect           Past Medical History:  Diagnosis Date  . Allergy   . Anxiety   . Arnold-Chiari malformation ([place])   . Arthritis   . CHF (congestive heart failure) ([place])   . Chronic systolic heart failure ([place]) 11/03/2019  . Coronary artery disease   . Encounter for assessment of implantable cardioverter-defibrillator (ICD) 11/10/2019  . GERD (gastroesophageal reflux disease)   . H. pylori infection    3 years ago  . H/O cardiac arrest    Hx of VT/Vfib arrest  . Hyperlipidemia   . ICD BiV Medtronic model [id number] Claria Quad CRT-D SureScan ICD 08/25/2019   . Incontinence   . Paroxysmal atrial  fibrillation ([place]) 08/06/2019  . Syncope and collapse    Per pt, denies passing out  . Tobacco use disorder   . Unspecified essential hypertension     Past Surgical History:  Procedure Laterality Date  . APPENDECTOMY    . ARNOLD CHIARI SURGERY     neurocranial surgery  . BIV ICD INSERTION CRT-D N/A 08/25/2019   Procedure: BIV ICD INSERTION CRT-D;  Surgeon: Constance Haw, MD;  Location: [facility];  Service: Cardiovascular;  Laterality: N/A;  . BLEPHAROPLASTY     Bil  . C-EYE SURGERY PROCEDURE    . CARDIAC CATHETERIZATION    . CHOLECYSTECTOMY    . EYE SURGERY    . heart failure    . LEFT HEART CATH AND CORONARY ANGIOGRAPHY N/A 08/04/2019   Procedure: LEFT HEART CATH AND CORONARY ANGIOGRAPHY;  Surgeon: Adrian Prows, MD;  Location: [facility];  Service: Cardiovascular;  Laterality: N/A;  . MASS EXCISION Right 12/27/2013   Procedure: MINOR EXCISION OF RIGHT THUMB MUCOID CYST, DEBRIDEMENT OF INTERPHALANGEAL JOINT;  Surgeon: Cammie Sickle, MD;  Location: [facility];  Service: Orthopedics;  Laterality: Right;  . mass on thumb  right  . TOTAL KNEE ARTHROPLASTY Right 02/17/2017  . TOTAL KNEE ARTHROPLASTY Right 02/17/2017   Procedure: TOTAL KNEE ARTHROPLASTY;  Surgeon: Melrose Nakayama, MD;  Location:  [facility];  Service: Orthopedics;  Laterality: Right;  . TUBAL LIGATION      There were no vitals filed for this visit.   Subjective Assessment - 02/13/20 1235    Subjective patient reports that she got membership for [facility]. patient has mild headache this morning. Reports HEP changes went well.    Patient is accompained by: Family member   her friend, Tye [place], in lobby   Pertinent History PMH includes anxiety, HTN, Chiari I malformation, a fib, CKD stage III, HLD, hx of R knee replacement, hx of sciatica for several years, Acute Ischemic CVA    Limitations Walking;Standing    Patient Stated Goals Improve strength/endurance    Currently in Pain? No/denies                [facility] PT Assessment - 02/13/20 0001      Assessment   Medical Diagnosis General Weakness/Fatigue - Post CVA    Referring Provider (PT) Clarene Reamer, FNP      6 Minute Walk- Baseline   6 Minute Walk- Baseline yes    HR (bpm) 98    02 Sat (%RA) 73 %    Modified Borg Scale for Dyspnea 0- Nothing at all      6 Minute walk- Post Test   6 Minute Walk Post Test yes    HR (bpm) 99    02 Sat (%RA) 98 %    Modified Borg Scale for Dyspnea 2- Mild shortness of breath    Perceived Rate of Exertion (Borg) 13- Somewhat hard      6 minute walk test results    Aerobic Endurance Distance Walked 1009    Endurance additional comments slow gait speed throughout completion, patient reporting this is her comfortable pace. continued to conversate throughout completion of 6MWT.       Functional Gait  Assessment   Gait assessed  Yes    Gait Level Surface Walks 20 ft in less than 7 sec but greater than 5.5 sec, uses assistive device, slower speed, mild gait deviations, or deviates 6-10 in outside of the 12 in walkway width.    Change in Gait Speed Able to change speed, demonstrates mild gait deviations, deviates 6-10 in outside of the 12 in walkway width, or no gait deviations, unable to achieve a major change in velocity, or uses a change in velocity, or uses an assistive device.    Gait with Horizontal Head Turns Performs head turns smoothly with slight change in gait velocity (eg, minor disruption to smooth gait path), deviates 6-10 in outside 12 in walkway width, or uses an assistive device.    Gait with Vertical Head Turns Performs task with slight change in gait velocity (eg, minor disruption to smooth gait path), deviates 6 - 10 in outside 12 in walkway width or uses assistive device    Gait and Pivot Turn Pivot turns safely within 3 sec and stops quickly with no loss of balance.    Step Over Obstacle Is able to step over one shoe box (4.5 in total height) without changing gait speed. No  evidence of imbalance.    Gait with Narrow Base of Support Ambulates 7-9 steps.    Gait with Eyes Closed Walks 20 ft, uses assistive device, slower speed, mild gait deviations, deviates 6-10 in outside 12 in walkway width. Ambulates 20 ft in less than 9 sec but greater than 7 sec.    Ambulating Backwards Walks 20 ft, uses assistive device, slower speed, mild gait deviations,  deviates 6-10 in outside 12 in walkway width.    Steps Alternating feet, must use rail.    Total Score 21    FGA comment: 21/30 = Medium Fall Risk                          [facility] Adult PT Treatment/Exercise - 02/13/20 0001      Transfers   Transfers Sit to Stand;Stand to Sit    Sit to Stand 6: Modified independent (Device/Increase time)    Five time sit to stand comments  13.04 secs w/o UE support from mat at standard chair height    Stand to Sit 6: Modified independent (Device/Increase time)      Ambulation/Gait   Ambulation/Gait Yes    Ambulation/Gait Assistance 6: Modified independent (Device/Increase time)    Ambulation/Gait Assistance Details with completion of 6MWT    Assistive device None    Gait Pattern Step-through pattern    Ambulation Surface Level;Indoor      Exercises   Exercises Other Exercises    Other Exercises  Due to patient getting gym membership, PT providing information on level to work on scitfit/nustep and provided HR range as well as duration of time with completion.                   PT Education - 02/13/20 1313    Education Details Progress toward LTG. Continuance of HEP and working at gym to maintain gains achieved with PT services.    Person(s) Educated Patient    Methods Explanation    Comprehension Verbalized understanding            PT Short Term Goals - 01/24/20 1237      PT SHORT TERM GOAL #1   Title Pt will be independent with initial HEP to improve balance/BLE strength    Baseline pt performing HEP - needs intermittent verbal cues for technique     Time 3    Period Weeks    Status Partially Met    Target Date 01/25/20      PT SHORT TERM GOAL #2   Title Patient will improve 5x sit <> stand to </= 20 seconds to demonstrate improved functional strength    Baseline 14 seconds with no UE support from standard height chair on 01/24/20    Time 3    Period Weeks    Status Achieved    Target Date 01/25/20             PT Long Term Goals - 02/13/20 1236      PT LONG TERM GOAL #1   Title Pt will be independent with final HEP for BLE strenghtening/balance ( ALL LTGS DUE 02/15/2020)    Baseline Patient reports indepenence and complaince with HEP    Time 6    Period Weeks    Status Achieved      PT LONG TERM GOAL #2   Title Patient will improve FGA by 5 points from baseline to demo decreased fall risk    Baseline 15/30, 21/30    Time 6    Period Weeks    Status Achieved      PT LONG TERM GOAL #3   Title Patient will ambulate 1,200 ft in 6 minutes w/o AD to demo improved endurance with walking    Baseline 1009 ft w/o AD    Time 6    Period Weeks    Status Not Met  PT LONG TERM GOAL #4   Title Patient will demo improved 5x sit <> stand to </= 15 seconds without UE support for improved functional BLE strength    Baseline 22.03, 13.04 secs w/ UE support    Time 6    Period Weeks    Status Achieved                 Plan - 02/13/20 1312    Clinical Impression Statement Today's skilled PT session included assessment of patient's progress toward all LTGs. Patient able to meet all, except 6MWT test goal with patient ambulating 1009 ft and normal vital response. Patient demonstrates improved functional strength, balance, and endurance with PT services. PT educating on contuining current HEP and completing aerobic activity at gym to maintain gains achieved with PT services. PT stating readiness for discharge, with patient verbalizing agreement.    Personal Factors and Comorbidities Comorbidity 3+    Comorbidities anxiety,  HTN, Chiari I malformation, a fib, CKD stage III, HLD, hx of R knee replacement, hx of sciatica for several years, Acute Ischemic CVA    Examination-Activity Limitations Squat;Stairs;Stand    Examination-Participation Restrictions Community Activity;Yard Work;Driving    Stability/Clinical Decision Making Evolving/Moderate complexity    Rehab Potential Good    PT Frequency 2x / week    PT Duration 6 weeks    PT Treatment/Interventions ADLs/Self Care Home Management;Cryotherapy;Electrical Stimulation;Moist Heat;DME Instruction;Gait training;Stair training;Functional mobility training;Therapeutic activities;Therapeutic exercise;Balance training;Neuromuscular re-education;Patient/family education;Manual techniques;Passive range of motion;Energy conservation    PT Home Exercise Plan [id number]    Consulted and Agree with Plan of Care Patient           Patient will benefit from skilled therapeutic intervention in order to improve the following deficits and impairments:  Abnormal gait, Decreased balance, Decreased endurance, Decreased range of motion, Decreased activity tolerance, Decreased strength, Difficulty walking, Pain, Postural dysfunction  Visit Diagnosis: Unsteadiness on feet  Other abnormalities of gait and mobility  Muscle weakness (generalized)     Problem List Patient Active Problem List   Diagnosis Date Noted  . Encounter for assessment of implantable cardioverter-defibrillator (ICD) 11/10/2019  . H/O cardiac arrest 08/03/2019 11/10/2019  . Coronary artery disease involving native coronary artery of native heart without angina pectoris 11/03/2019  . Benign hypertensive CKD, stage 1-4 or unspecified chronic kidney disease 11/03/2019  . Chronic systolic heart failure ([place]) 11/03/2019  . Lightheadedness 11/03/2019  . CHF (congestive heart failure) ([place]) 10/18/2019  . Pruritus 09/01/2019  . ICD BiV Medtronic model [id number] Claria Quad CRT-D SureScan ICD 08/25/2019   . Cardiac  arrest with ventricular fibrillation ([place]) 08/24/2019  . TIA (transient ischemic attack) 08/15/2019  . Long term current use of anticoagulant 08/14/2019  . Cardiomyopathy ([place]) 08/14/2019  . Paroxysmal atrial fibrillation ([place]) 08/06/2019  . Statin intolerance 08/03/2019  . Dysphagia   . History of CVA (cerebrovascular accident) 08/02/2019  . Hypokalemia 08/02/2019  . Left bundle branch block 08/02/2019  . CKD (chronic kidney disease) stage 3, GFR 30-59 ml/min 12/13/2018  . Primary localized osteoarthritis of right knee 02/17/2017  . Primary osteoarthritis of right knee 02/17/2017  . Preventative health care 09/01/2016  . Vitamin B12 deficiency 09/10/2015  . Insomnia 07/16/2015  . GERD (gastroesophageal reflux disease) 03/26/2015  . Fatigue 10/02/2014  . Other malaise and fatigue 02/08/2014  . HLD (hyperlipidemia) 11/19/2011  . Vaginal dryness, menopausal 11/19/2011  . Adjustment disorder 01/08/2011  . Former smoker 12/19/2008  . Essential hypertension 12/19/2008    Jones Bales, PT,  DPT 02/13/2020, 2:15 PM  [facility] [address] [place], [place], [address] Phone: [phone]   Fax:  [phone]  Name: [name] MRN: [id number] Date of Birth: 08-Aug-1950

## 2020-02-15 ENCOUNTER — Ambulatory Visit: Payer: Medicare HMO | Admitting: Physical Therapy

## 2020-02-15 ENCOUNTER — Ambulatory Visit: Payer: Medicare HMO

## 2020-02-21 ENCOUNTER — Ambulatory Visit: Payer: Medicare HMO

## 2020-02-23 ENCOUNTER — Ambulatory Visit: Payer: Medicare HMO

## 2020-02-24 DIAGNOSIS — Z4502 Encounter for adjustment and management of automatic implantable cardiac defibrillator: Secondary | ICD-10-CM | POA: Diagnosis not present

## 2020-02-24 DIAGNOSIS — I5022 Chronic systolic (congestive) heart failure: Secondary | ICD-10-CM | POA: Diagnosis not present

## 2020-02-24 DIAGNOSIS — Z95810 Presence of automatic (implantable) cardiac defibrillator: Secondary | ICD-10-CM | POA: Diagnosis not present

## 2020-02-24 DIAGNOSIS — I4901 Ventricular fibrillation: Secondary | ICD-10-CM | POA: Diagnosis not present

## 2020-02-28 ENCOUNTER — Ambulatory Visit: Payer: Medicare HMO

## 2020-02-28 ENCOUNTER — Other Ambulatory Visit: Payer: Self-pay

## 2020-02-28 DIAGNOSIS — I5022 Chronic systolic (congestive) heart failure: Secondary | ICD-10-CM | POA: Diagnosis not present

## 2020-03-02 ENCOUNTER — Ambulatory Visit: Payer: Medicare HMO | Admitting: Cardiology

## 2020-03-02 ENCOUNTER — Encounter: Payer: Self-pay | Admitting: Cardiology

## 2020-03-02 ENCOUNTER — Other Ambulatory Visit: Payer: Self-pay

## 2020-03-02 VITALS — BP 148/72 | HR 77 | Ht 64.0 in | Wt 144.0 lb

## 2020-03-02 DIAGNOSIS — I5022 Chronic systolic (congestive) heart failure: Secondary | ICD-10-CM

## 2020-03-02 DIAGNOSIS — Z8679 Personal history of other diseases of the circulatory system: Secondary | ICD-10-CM | POA: Diagnosis not present

## 2020-03-02 DIAGNOSIS — Z87891 Personal history of nicotine dependence: Secondary | ICD-10-CM | POA: Diagnosis not present

## 2020-03-02 DIAGNOSIS — I480 Paroxysmal atrial fibrillation: Secondary | ICD-10-CM | POA: Diagnosis not present

## 2020-03-02 DIAGNOSIS — E782 Mixed hyperlipidemia: Secondary | ICD-10-CM

## 2020-03-02 DIAGNOSIS — Z7901 Long term (current) use of anticoagulants: Secondary | ICD-10-CM

## 2020-03-02 DIAGNOSIS — I469 Cardiac arrest, cause unspecified: Secondary | ICD-10-CM

## 2020-03-02 DIAGNOSIS — I447 Left bundle-branch block, unspecified: Secondary | ICD-10-CM

## 2020-03-02 DIAGNOSIS — Z95810 Presence of automatic (implantable) cardiac defibrillator: Secondary | ICD-10-CM | POA: Diagnosis not present

## 2020-03-02 DIAGNOSIS — Z8673 Personal history of transient ischemic attack (TIA), and cerebral infarction without residual deficits: Secondary | ICD-10-CM | POA: Diagnosis not present

## 2020-03-02 NOTE — Progress Notes (Signed)
[name] Date of Birth: 1951-04-22 MRN: [id number] Primary Care Provider:Gessner, Dalbert Batman, FNP Primary Cardiologist: Rex Kras, DO Electrophysiologist: Dr. Reggy Eye   Date: 03/02/2020 Last Office Visit: 12/08/2019  Chief Complaint  Patient presents with  . heart failure  . Follow-up   HPI  [name] is a 69 y.o. female who presents to the office with a chief complaint of "evaluation of heart failure." Patient's past medical history and cardiac risk factors include: Hypertension, hyperlipidemia, former smoker, history of Arnold-Chiari malformation, history of VT/VF, history of torsades, cardiac arrest, nonischemic cardiomyopathy, peripheral vascular disease, paroxysmal atrial fibrillation, left bundle branch block, history of CVA.  Patient is accompanied by her daughter [name] at today's office visit.   Patient is here for 31-month follow-up for heart failure management.  Since last office visit patient has not been hospitalized for cardiovascular symptoms.  Patient is euvolemic.  She appears to be in very good spirits compared to prior office visits.  Patient has gained weight since last office visit due to decreased appetite.  She continues to go to physical therapy twice a week and gradually increasing her physical activity as tolerated.  She is not having any chest pain or shortness of breath at rest or with effort related activities.  She has establish care with nephrology and has an upcoming appointment in the near future.  Last BiV ICD interrogation was performed on February 24, 2020.  Results reviewed with the patient and her daughter at today's office visit.  ALLERGIES: Allergies  Allergen Reactions  . Shellfish-Derived Products Anaphylaxis  . Ativan [Lorazepam] Other (See Comments)    Makes "skin crawl" and insomnia  . Buspar [Buspirone] Nausea Only    Sweating and dizzy  . Clarithromycin Swelling  . Codeine Nausea And Vomiting  . Doxycycline Other  (See Comments)    Unknown  . Guaifenesin Other (See Comments)    Palpitations  . Oxycodone Nausea And Vomiting  . Prednisone Nausea And Vomiting and Other (See Comments)    Makes my heart race  . Statins Other (See Comments)    Muscle pain  . Sulfonamide Derivatives Nausea And Vomiting    Achiness  . Tetracycline Nausea Only  . Vicodin [Hydrocodone-Acetaminophen] Nausea And Vomiting  . Famotidine Rash  . Latex Rash  . Omeprazole Nausea Only    Cough, shortness of breath - patient doesn't remember  . Peanut-Containing Drug Products Itching and Rash  . Protonix [Pantoprazole] Rash  . Wellbutrin [Bupropion] Palpitations     MEDICATION LIST PRIOR TO VISIT: Current Outpatient Medications on File Prior to Visit  Medication Sig Dispense Refill  . acetaminophen (TYLENOL) 500 MG tablet Take 1,000 mg by mouth 2 (two) times daily as needed (pain).    [name] apixaban (ELIQUIS) 5 MG TABS tablet Take 1 tablet (5 mg total) by mouth 2 (two) times daily. 180 tablet 3  . aspirin 81 MG chewable tablet Chew 1 tablet (81 mg total) by mouth daily. 30 tablet 0  . docusate sodium (COLACE) 100 MG capsule Take 1 capsule (100 mg total) by mouth daily as needed for mild constipation. 30 capsule 0  . ezetimibe (ZETIA) 10 MG tablet TAKE 1 TABLET BY MOUTH EVERY DAY 90 tablet 1  . fluticasone (FLONASE) 50 MCG/ACT nasal spray Place 1 spray into both nostrils daily. 16 g 2  . loratadine (CLARITIN) 10 MG tablet Take 10 mg by mouth daily.    . Magnesium 200 MG TABS Take 400 mg by mouth 2 (two) times  daily.     . ondansetron (ZOFRAN ODT) 4 MG disintegrating tablet Take 1 tablet (4 mg total) by mouth every 8 (eight) hours as needed for nausea or vomiting. 20 tablet 0  . rosuvastatin (CRESTOR) 10 MG tablet TAKE 1 TABLET (10 MG TOTAL) BY MOUTH DAILY AT 6 PM. 90 tablet 1  . sertraline (ZOLOFT) 50 MG tablet Take 1 tablet (50 mg total) by mouth at bedtime. To start medication- take 1/2 tablet at bedtime for 7 days then increase  to 1 tablet at bedtime. 90 tablet 3   No current facility-administered medications on file prior to visit.    PAST MEDICAL HISTORY: Past Medical History:  Diagnosis Date  . Allergy   . Anxiety   . Arnold-Chiari malformation ([place])   . Arthritis   . CHF (congestive heart failure) ([place])   . Chronic systolic heart failure ([place]) 11/03/2019  . Coronary artery disease   . Encounter for assessment of implantable cardioverter-defibrillator (ICD) 11/10/2019  . GERD (gastroesophageal reflux disease)   . H. pylori infection    3 years ago  . H/O cardiac arrest    Hx of VT/Vfib arrest  . Hyperlipidemia   . ICD BiV Medtronic model [id number] Claria Quad CRT-D SureScan ICD 08/25/2019   . Incontinence   . Paroxysmal atrial fibrillation ([place]) 08/06/2019  . Syncope and collapse    Per pt, denies passing out  . Tobacco use disorder   . Unspecified essential hypertension     PAST SURGICAL HISTORY: Past Surgical History:  Procedure Laterality Date  . APPENDECTOMY    . ARNOLD CHIARI SURGERY     neurocranial surgery  . BIV ICD INSERTION CRT-D N/A 08/25/2019   Procedure: BIV ICD INSERTION CRT-D;  Surgeon: Constance Haw, MD;  Location: [facility];  Service: Cardiovascular;  Laterality: N/A;  . BLEPHAROPLASTY     Bil  . C-EYE SURGERY PROCEDURE    . CARDIAC CATHETERIZATION    . CHOLECYSTECTOMY    . EYE SURGERY    . heart failure    . LEFT HEART CATH AND CORONARY ANGIOGRAPHY N/A 08/04/2019   Procedure: LEFT HEART CATH AND CORONARY ANGIOGRAPHY;  Surgeon: Adrian Prows, MD;  Location: [facility];  Service: Cardiovascular;  Laterality: N/A;  . MASS EXCISION Right 12/27/2013   Procedure: MINOR EXCISION OF RIGHT THUMB MUCOID CYST, DEBRIDEMENT OF INTERPHALANGEAL JOINT;  Surgeon: Cammie Sickle, MD;  Location: [facility];  Service: Orthopedics;  Laterality: Right;  . mass on thumb  right  . TOTAL KNEE ARTHROPLASTY Right 02/17/2017  . TOTAL KNEE ARTHROPLASTY Right  02/17/2017   Procedure: TOTAL KNEE ARTHROPLASTY;  Surgeon: Melrose Nakayama, MD;  Location: [facility];  Service: Orthopedics;  Laterality: Right;  . TUBAL LIGATION      FAMILY HISTORY: The patient family history includes Bone cancer in her mother; Cancer in her sister; Diabetes in her sister; Diverticulosis in her sister; Heart disease in her mother; Hypertension in her sister; Tuberculosis in her father.   SOCIAL HISTORY:  The patient  reports that she quit smoking about 7 months ago. Her smoking use included cigarettes. She has a 50.00 pack-year smoking history. She has never used smokeless tobacco. She reports that she does not drink alcohol and does not use drugs.  Review of Systems  Constitutional: Negative for chills, decreased appetite, fever and malaise/fatigue.  HENT: Negative for ear discharge, ear pain and nosebleeds.   Eyes: Negative for blurred vision and discharge.  Cardiovascular: Negative  for chest pain, claudication, dyspnea on exertion, leg swelling, near-syncope, orthopnea, palpitations, paroxysmal nocturnal dyspnea and syncope.  Respiratory: Negative for cough and shortness of breath.   Endocrine: Negative for polydipsia, polyphagia and polyuria.  Hematologic/Lymphatic: Negative for bleeding problem.  Skin: Negative for flushing and nail changes.  Musculoskeletal: Negative for muscle cramps, muscle weakness and myalgias.  Gastrointestinal: Negative for abdominal pain, dysphagia, hematemesis, hematochezia, melena, nausea and vomiting.  Neurological: Negative for dizziness, focal weakness and light-headedness.  Psychiatric/Behavioral: Negative for suicidal ideas.   PHYSICAL EXAM: Vitals with BMI 03/06/2020 03/06/2020 03/06/2020  Height - - -  Weight - - -  BMI - - -  Systolic [phone]  Diastolic 70 71 66  Pulse 77 68 72   CONSTITUTIONAL: Appears older than stated age, hemodynamically stable, no acute distress.    SKIN: Skin is warm and dry. No rash noted. No cyanosis. No  pallor. No jaundice HEAD: Normocephalic and atraumatic.  EYES: No scleral icterus MOUTH/THROAT: Moist oral membranes.  NECK: No JVD present. No thyromegaly noted.   LYMPHATIC: No visible cervical adenopathy.  CHEST Normal respiratory effort. No intercostal retractions.  BiV ICD noted left infraclavicular region. LUNGS: Clear to auscultation bilaterally.  No stridor. No wheezes. No rales.  CARDIOVASCULAR: Regular, positive Y8-F0, soft holosystolic murmur heard at the apex radiating to axilla, no gallops or rubs appreciated ABDOMINAL: Nonobese, soft, nontender, nondistended, positive bowel sounds in all 4 quadrants no apparent ascites.  EXTREMITIES: Trace bilateral peripheral edema. 2+ right femoral pulse, 1+ left femoral pulse, none palpable bilateral popliteal, dorsalis pedis or posterior tibial pulses.  Warm to touch bilaterally.  No discoloration or cyanosis present. HEMATOLOGIC: No significant bruising NEUROLOGIC: Oriented to person, place, and time. Nonfocal. Normal muscle tone.  PSYCHIATRIC: Normal mood and affect. Normal behavior. Cooperative  CARDIAC DATABASE: EKG: 08/12/2019: Normal sinus rhythm with ventricular rate of 61 bpm, left axis deviation, left bundle branch block, nonspecific T wave abnormalities.  Prior EKG dated 08/04/2019 shows a normal sinus rhythm, left axis deviation, left bundle branch block. 12/08/2019: Atrial paced,  Ventricular sensed rhythm.  Echocardiogram: 02/28/2020:  Left ventricle cavity is normal in size. Moderate concentric hypertrophy of the left ventricle. Severe global hypokinesis with abnormal septal wall motion due to left bundle branch block. LVEF 30-35%. Doppler evidence of  grade I (impaired) diastolic dysfunction, normal LAP. Calculated EF 30%.  Trileaflet aortic valve. Trace aortic regurgitation.  Moderate to severe mitral regurgitation.  Mild tricuspid regurgitation. Estimated pulmonary artery systolic pressure 28 mmHg.  Compared to previous  studies in 07/2019, LVEF is marginally improved from  25-30%, but mitral regurgitation has worsened from mild.   Heart Catheterization: 08/04/19:  LV: Global hypokinesis, upper limit of normal size, EF 25 to 30%. Left main: Normal. LAD: Mild diffuse disease.  Proximal LAD has a 30 to 40% stenosis, mid segment has a 20 to 30% stenosis, scattered disease noted in the LAD.  Brisk flow. Circumflex: Again scattered disease noted in the circumflex.  Mid segment has at most a 30 to 40% stenosis which appears to be eccentric and calcified. RCA: Dominant.  Mild diffuse disease again noted.  Mid segment after the origin of RV branch has a 70 to 80% stenosis.  Brisk flow is evident throughout the RCA. Impression: Findings consistent with nonischemic cardiomyopathy.  Although she has significant disease in the right coronary artery, this does not explain her presentation with global hypokinesis, neither does it explain VF arrest as the lesion does not appear to be unstable.  Biventricular ICD implantation 08/25/2019: ICD BiV Medtronic model [id number] Claria Quad CRT-D SureScan ICD  Carotid duplex: 08/03/2019: Right Carotid: Velocities in the right ICA are consistent with a 1-39% stenosis. Left Carotid: Velocities in the left ICA are consistent with a 1-39%  stenosis. Vertebrals: Bilateral vertebral arteries demonstrate antegrade flow. Subclavians: Normal flow hemodynamics were seen in bilateral subclavian arteries.  LABORATORY DATA: CBC Latest Ref Rng & Units 03/06/2020 12/07/2019 10/18/2019  WBC 4.0 - 10.5 K/uL 8.3 4.4 7.8  Hemoglobin 12.0 - 15.0 g/dL 10.7(L) 11.8(L) 11.5(L)  Hematocrit 36 - 46 % 35.0(L) 37.0 34.7(L)  Platelets 150 - 400 K/uL 208 197 201.0    CMP Latest Ref Rng & Units 03/06/2020 12/07/2019 11/17/2019  Glucose 70 - 99 mg/dL 93 106(H) 89  BUN 8 - 23 mg/dL 18 14 18   Creatinine 0.44 - 1.00 mg/dL 2.73(H) 2.25(H) 2.37(H)  Sodium 135 - 145 mmol/L 140 138 140  Potassium 3.5 - 5.1 mmol/L 4.9 4.5  4.9  Chloride 98 - 111 mmol/L 109 104 102  CO2 22 - 32 mmol/L 20(L) 26 24  Calcium 8.9 - 10.3 mg/dL 8.9 9.1 9.2  Total Protein 6.5 - 8.1 g/dL - 6.8 -  Total Bilirubin 0.3 - 1.2 mg/dL - 0.4 -  Alkaline Phos 38 - 126 U/L - 76 -  AST 15 - 41 U/L - 21 -  ALT 0 - 44 U/L - 12 -    Lipid Panel     Component Value Date/Time   CHOL 114 09/21/2019 1416   TRIG 147 09/21/2019 1416   HDL 33 (L) 09/21/2019 1416   CHOLHDL 7.5 08/03/2019 0249   VLDL 26 08/03/2019 0249   LDLCALC 55 09/21/2019 1416   LDLDIRECT 193.0 09/02/2016 0839   LABVLDL 26 09/21/2019 1416    Lab Results  Component Value Date   HGBA1C 5.7 (H) 08/03/2019   No components found for: NTPROBNP Lab Results  Component Value Date   TSH 1.487 12/07/2019   TSH 0.550 08/24/2019   TSH 0.75 08/23/2019    FINAL MEDICATION LIST END OF ENCOUNTER:   Current Outpatient Medications:  .  acetaminophen (TYLENOL) 500 MG tablet, Take 1,000 mg by mouth 2 (two) times daily as needed (pain)., Disp: , Rfl:  .  apixaban (ELIQUIS) 5 MG TABS tablet, Take 1 tablet (5 mg total) by mouth 2 (two) times daily., Disp: 180 tablet, Rfl: 3 .  aspirin 81 MG chewable tablet, Chew 1 tablet (81 mg total) by mouth daily., Disp: 30 tablet, Rfl: 0 .  docusate sodium (COLACE) 100 MG capsule, Take 1 capsule (100 mg total) by mouth daily as needed for mild constipation., Disp: 30 capsule, Rfl: 0 .  ezetimibe (ZETIA) 10 MG tablet, TAKE 1 TABLET BY MOUTH EVERY DAY, Disp: 90 tablet, Rfl: 1 .  fluticasone (FLONASE) 50 MCG/ACT nasal spray, Place 1 spray into both nostrils daily., Disp: 16 g, Rfl: 2 .  loratadine (CLARITIN) 10 MG tablet, Take 10 mg by mouth daily., Disp: , Rfl:  .  Magnesium 200 MG TABS, Take 400 mg by mouth 2 (two) times daily. , Disp: , Rfl:  .  ondansetron (ZOFRAN ODT) 4 MG disintegrating tablet, Take 1 tablet (4 mg total) by mouth every 8 (eight) hours as needed for nausea or vomiting., Disp: 20 tablet, Rfl: 0 .  rosuvastatin (CRESTOR) 10 MG  tablet, TAKE 1 TABLET (10 MG TOTAL) BY MOUTH DAILY AT 6 PM., Disp: 90 tablet, Rfl: 1 .  sertraline (ZOLOFT) 50 MG tablet, Take 1  tablet (50 mg total) by mouth at bedtime. To start medication- take 1/2 tablet at bedtime for 7 days then increase to 1 tablet at bedtime., Disp: 90 tablet, Rfl: 3 .  carvedilol (COREG) 12.5 MG tablet, TAKE 1 TABLET (12.5 MG TOTAL) BY MOUTH 2 (TWO) TIMES DAILY WITH A MEAL., Disp: 180 tablet, Rfl: 1  IMPRESSION:    ICD-10-CM   1. Chronic HFrEF (heart failure with reduced ejection fraction) (HCC)  I50.22   2. Paroxysmal atrial fibrillation (HCC)  I48.0   3. Long term current use of anticoagulant  Z79.01   4. Cardiac arrest with ventricular fibrillation (HCC)  I46.9    I49.01   5. Biventricular ICD (implantable cardioverter-defibrillator) in place  Z95.810   6. History of torsades de pointes  Z86.79   7. Left bundle branch block  I44.7   8. Mixed hyperlipidemia  E78.2   9. History of CVA (cerebrovascular accident)  Z86.73   10. Former smoker  Z87.891      RECOMMENDATIONS: [name] is a 69 y.o. female whose past medical history and cardiac risk factors include: Hypertension, hyperlipidemia, former smoker, history of Arnold-Chiari malformation, history of VT/VF, history of torsades, cardiac arrest, nonischemic cardiomyopathy, peripheral vascular disease, paroxysmal atrial fibrillation, left bundle branch block, history of CVA.  Chronic heart failure with reduced ejection fraction, stage C, NYHA class II:  Continue current medical therapy.  Unable to be on ARB's, Arni, Aldactone secondary to worsening renal function.  Patient was also on hydralazine/Isordil combination and was not able to tolerate it secondary to feeling tired, fatigued.  Informed both the patient and her daughter that her blood pressures can tolerate up titration of medical therapy.  However, they do not want to uptitrate medications at this point as she is feeling well.  Strict I's and O's,  and daily weights recommended.  Fluid restriction to less than 2 L/day, sodium restriction to less than 1.5 g/day.  Most recent echocardiogram results from September 2021 reviewed with the patient and her daughter at today's visit.  LVEF has slightly improved compared to prior.  Cardiomyopathy, suggestive of nonischemic: See above  Status post BiV ICD status post ventricular fibrillation arrest: Patient  Medtronic model [id number] Claria Quad CRT-D SureScan (serial Number [id number] )   Last device check reviewed with patient and daughter.   Left bundle branch block: Continue to monitor.  Paroxysmal atrial fibrillation:  Rate control: Coreg.    Rhythm control: None.  Thromboembolic prophylaxis currently on Eliquis.  Patient does not endorse any evidence of bleeding.  CHA2DS2VASc score: 7. Annual stroke risk: 9.6% (congestive heart failure, hypertension, age, history of stroke, medical history of peripheral vascular disease, and gender)  Long-term oral anticoagulation use:  Indication paroxysmal atrial fibrillation.  Patient did not endorse any evidence of bleeding  Mixed hyperlipidemia: Currently not at goal.  Currently on Crestor and Zetia.  We will continue to monitor.  Patient does not endorse any evidence of myalgias.  History of CVA: Educated on the importance of secondary prevention.  Will defer further management to primary and neurology.  Former smoker: Educated on the importance of continued smoking cessation.  No orders of the defined types were placed in this encounter.  --Continue cardiac medications as reconciled in final medication list. --Return in about 3 months (around 06/01/2020) for heart failure management... Or sooner if needed. --Continue follow-up with your primary care physician regarding the management of your other chronic comorbid conditions.  Patient's questions and concerns were addressed to her  and her daughter's satisfaction. She and her  daughter voices understanding of the instructions provided during this encounter.   This note was created using a voice recognition software as a result there may be grammatical errors inadvertently enclosed that do not reflect the nature of this encounter. Every attempt is made to correct such errors.  Total time spent: 30 minutes.   Rex Kras, [place], [facility]  Pager: [phone] Office: [phone]

## 2020-03-03 ENCOUNTER — Other Ambulatory Visit: Payer: Self-pay | Admitting: Cardiology

## 2020-03-03 DIAGNOSIS — I5022 Chronic systolic (congestive) heart failure: Secondary | ICD-10-CM

## 2020-03-06 ENCOUNTER — Encounter (HOSPITAL_COMMUNITY): Payer: Self-pay | Admitting: Emergency Medicine

## 2020-03-06 ENCOUNTER — Other Ambulatory Visit: Payer: Self-pay

## 2020-03-06 ENCOUNTER — Ambulatory Visit (HOSPITAL_COMMUNITY)
Admission: EM | Admit: 2020-03-06 | Discharge: 2020-03-06 | Disposition: A | Discharge status: Home / Self Care | Payer: Medicare HMO | Attending: Internal Medicine | Admitting: Internal Medicine

## 2020-03-06 ENCOUNTER — Emergency Department (HOSPITAL_COMMUNITY)
Admission: EM | Admit: 2020-03-06 | Discharge: 2020-03-06 | Disposition: A | Discharge status: Home / Self Care | Payer: Medicare HMO | Attending: Emergency Medicine | Admitting: Emergency Medicine

## 2020-03-06 ENCOUNTER — Emergency Department (HOSPITAL_COMMUNITY): Payer: Medicare HMO

## 2020-03-06 ENCOUNTER — Telehealth: Payer: Self-pay

## 2020-03-06 DIAGNOSIS — R0602 Shortness of breath: Secondary | ICD-10-CM | POA: Diagnosis not present

## 2020-03-06 DIAGNOSIS — R197 Diarrhea, unspecified: Secondary | ICD-10-CM | POA: Diagnosis not present

## 2020-03-06 DIAGNOSIS — I509 Heart failure, unspecified: Secondary | ICD-10-CM | POA: Diagnosis not present

## 2020-03-06 DIAGNOSIS — Z5321 Procedure and treatment not carried out due to patient leaving prior to being seen by health care provider: Secondary | ICD-10-CM | POA: Insufficient documentation

## 2020-03-06 DIAGNOSIS — Z950 Presence of cardiac pacemaker: Secondary | ICD-10-CM | POA: Diagnosis not present

## 2020-03-06 DIAGNOSIS — R0789 Other chest pain: Secondary | ICD-10-CM | POA: Diagnosis not present

## 2020-03-06 DIAGNOSIS — R079 Chest pain, unspecified: Secondary | ICD-10-CM | POA: Diagnosis not present

## 2020-03-06 LAB — BASIC METABOLIC PANEL
Anion gap: 11 (ref 5–15)
BUN: 18 mg/dL (ref 8–23)
CO2: 20 mmol/L — ABNORMAL LOW (ref 22–32)
Calcium: 8.9 mg/dL (ref 8.9–10.3)
Chloride: 109 mmol/L (ref 98–111)
Creatinine, Ser: 2.73 mg/dL — ABNORMAL HIGH (ref 0.44–1.00)
GFR calc Af Amer: 20 mL/min — ABNORMAL LOW (ref >60)
GFR calc non Af Amer: 17 mL/min — ABNORMAL LOW (ref >60)
Glucose, Bld: 93 mg/dL (ref 70–99)
Potassium: 4.9 mmol/L (ref 3.5–5.1)
Sodium: 140 mmol/L (ref 135–145)

## 2020-03-06 LAB — CBC
HCT: 35 % — ABNORMAL LOW (ref 36.0–46.0)
Hemoglobin: 10.7 g/dL — ABNORMAL LOW (ref 12.0–15.0)
MCH: 27.9 pg (ref 26.0–34.0)
MCHC: 30.6 g/dL (ref 30.0–36.0)
MCV: 91.1 fL (ref 80.0–100.0)
Platelets: 208 10*3/uL (ref 150–400)
RBC: 3.84 MIL/uL — ABNORMAL LOW (ref 3.87–5.11)
RDW: 13.9 % (ref 11.5–15.5)
WBC: 8.3 10*3/uL (ref 4.0–10.5)
nRBC: 0 % (ref 0.0–0.2)

## 2020-03-06 LAB — TROPONIN I (HIGH SENSITIVITY): Troponin I (High Sensitivity): 15 ng/L (ref <18)

## 2020-03-06 NOTE — ED Notes (Signed)
Patient is being discharged from the Urgent Care and sent to the Emergency Department via private vehicle. Per Dr. Mannie Stabile, patient is in need of higher level of care due to chest pain, extensive cardiac history. Patient is aware and verbalizes understanding of plan of care.  Vitals:   03/06/20 1041  BP: (!) 164/85  Pulse: 72  Resp: 20  Temp: 98.4 F (36.9 C)  SpO2: 97%

## 2020-03-06 NOTE — ED Triage Notes (Addendum)
Patient presents to [facility] for shortness of breath starting last night with some substernal chest pressure.  Patient states she also had a lot of diarrhea last night after egg salad as well.  Denies jaw pain, arm pain, light-headedness or dizzines.  C/o mild nausea, and intermittent sharp back pain.    Patient has a hx of 2 cardiac arrests, stroke, and a Investment banker, corporate.  Denies any shocks in the last week

## 2020-03-06 NOTE — ED Triage Notes (Addendum)
Onset last night developed chest pain and shortness of breath. History of pacemaker and defibrillator and cardiac arrest x 2 this year. States also having diarrhea all night possibly from eating a salad yesterday.

## 2020-03-06 NOTE — Telephone Encounter (Signed)
Pt said had pacemaker put in earlier this year and called [facility] office but pts dr is not in and pt was advised to ck with PCP;pt has been SOB all night continuously; pt is obviously SOB while trying to talk; No CP,H/A or dizziness. Pt does not want to go to ED due to wait time and pt will go to [facility] on [address] in [place]. Denied me to call 911 and pts husband will take pt to UC now. FYI to Glenda Chroman FNP who is PCP but out of office and Dr Einar Pheasant who is in office.

## 2020-03-06 NOTE — Telephone Encounter (Signed)
Agree with in-person evaluation today.

## 2020-03-07 ENCOUNTER — Telehealth: Payer: Self-pay | Admitting: Family Medicine

## 2020-03-07 NOTE — Telephone Encounter (Signed)
Called and spoke to patient's daughter, [name]. Patient did not stay at ER yesterday. Got tired of waiting. Feels better today. Creatinine slightly elevated, discussed importance of increasing fluids, treating/ avoiding diarrhea (seems to be caused by patient's food choices according to her daughter). Suggested follow up if symptoms not improved. Has nephrology follow up in approximately 3 weeks. Can wait for that visit if symptoms resolve and able to maintain adequate hydration.

## 2020-03-09 ENCOUNTER — Ambulatory Visit: Payer: Medicare HMO | Admitting: Cardiology

## 2020-03-23 ENCOUNTER — Ambulatory Visit: Payer: Medicare HMO | Admitting: Family Medicine

## 2020-03-26 ENCOUNTER — Encounter: Payer: Self-pay | Admitting: Family Medicine

## 2020-03-26 ENCOUNTER — Ambulatory Visit (INDEPENDENT_AMBULATORY_CARE_PROVIDER_SITE_OTHER): Payer: Medicare HMO | Admitting: Family Medicine

## 2020-03-26 ENCOUNTER — Other Ambulatory Visit: Payer: Self-pay

## 2020-03-26 VITALS — BP 128/62 | HR 78 | Temp 97.0°F | Ht 64.0 in | Wt 147.2 lb

## 2020-03-26 DIAGNOSIS — R350 Frequency of micturition: Secondary | ICD-10-CM | POA: Diagnosis not present

## 2020-03-26 DIAGNOSIS — E559 Vitamin D deficiency, unspecified: Secondary | ICD-10-CM

## 2020-03-26 DIAGNOSIS — R1010 Upper abdominal pain, unspecified: Secondary | ICD-10-CM | POA: Diagnosis not present

## 2020-03-26 DIAGNOSIS — E538 Deficiency of other specified B group vitamins: Secondary | ICD-10-CM | POA: Diagnosis not present

## 2020-03-26 DIAGNOSIS — R197 Diarrhea, unspecified: Secondary | ICD-10-CM | POA: Diagnosis not present

## 2020-03-26 LAB — POC URINALSYSI DIPSTICK (AUTOMATED)
Bilirubin, UA: NEGATIVE
Blood, UA: POSITIVE
Glucose, UA: NEGATIVE
Ketones, UA: NEGATIVE
Leukocytes, UA: NEGATIVE
Nitrite, UA: NEGATIVE
Protein, UA: NEGATIVE
Spec Grav, UA: 1.02 (ref 1.010–1.025)
Urobilinogen, UA: 0.2 E.U./dL
pH, UA: 6 (ref 5.0–8.0)

## 2020-03-26 MED ORDER — ONDANSETRON 4 MG PO TBDP: 4.0000 mg | ORAL_TABLET | Freq: Three times a day (TID) | ORAL | 1 refills | Status: DC | PRN

## 2020-03-26 NOTE — Progress Notes (Signed)
Subjective:    Patient ID: [name], female    DOB: 09-06-1950, 68 y.o.   MRN: [id number]  HPI Chief Complaint  Patient presents with  . Referral    need gastrologist  . Nausea    x1 month  . Diarrhea  This is a 69 yo female, accompanied by her daughter, who presents today for above cc.   Diarrhea- off and on for this year. Seems to have started at beginning of year when she started her heart medications. Worse with certain foods, hamburger, broccoli, fruits.  She has history of cholecystectomy, appendectomy, H. pylori.  Last seen by GI 2017, had EGD and colonoscopy.  Of note, indication was diarrhea.  She has variable number of stools per day.  Watery brown.  No blood.  She is overdue for repeat colonoscopy.  Nausea- just started a couple of weeks ago, indigestion, frequent burping. Feels pressure at top of abdomen. No vomiting.  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  She is currently taking famotidine twice a day, has had allergic reactions with PPI.  Initially, famotidine seemed to improve symptoms.  She has not tried any other over-the-counter medications.  Incontinence- increased urge incontinence after prolonged sitting. Getting up minimum 5 times a night.  No dysuria or hematuria    Review of Systems No chest pain, feels pressure at upper abdomen that makes her feel short of breath.  Some upper throat wheeze at night, no cough.  No leg swelling.    Objective:   Physical Exam Vitals reviewed.  Constitutional:      General: She is not in acute distress.    Appearance: Normal appearance. She is normal weight. She is not ill-appearing, toxic-appearing or diaphoretic.  HENT:     Head: Normocephalic and atraumatic.  Eyes:     Conjunctiva/sclera: Conjunctivae normal.  Cardiovascular:     Rate and Rhythm: Normal rate and regular rhythm.     Heart sounds: Normal heart sounds.  Pulmonary:     Effort: Pulmonary effort is normal.     Breath sounds: Normal breath  sounds.  Musculoskeletal:     Right lower leg: No edema.     Left lower leg: No edema.  Skin:    General: Skin is warm and dry.  Neurological:     Mental Status: She is alert and oriented to person, place, and time.  Psychiatric:        Mood and Affect: Mood normal.        Behavior: Behavior normal.        Thought Content: Thought content normal.        Judgment: Judgment normal.       BP 128/62   Pulse 78   Temp (!) 97 F (36.1 C) (Temporal)   Ht 5\' 4"  (1.626 m)   Wt 147 lb 4 oz (66.8 kg)   SpO2 94%   BMI 25.28 kg/m  Wt Readings from Last 3 Encounters:  03/26/20 147 lb 4 oz (66.8 kg)  03/06/20 144 lb (65.3 kg)  03/02/20 144 lb (65.3 kg)   Results for orders placed or performed in visit on 03/26/20  POCT Urinalysis Dipstick (Automated)  Result Value Ref Range   Color, UA yellow    Clarity, UA clear    Glucose, UA Negative Negative   Bilirubin, UA negative    Ketones, UA negative    Spec Grav, UA 1.020 1.010 - 1.025   Blood, UA positive    pH, UA  6.0 5.0 - 8.0   Protein, UA Negative Negative   Urobilinogen, UA 0.2 0.2 or 1.0 E.U./dL   Nitrite, UA negative    Leukocytes, UA Negative Negative       Assessment & Plan:  1. Upper abdominal pain - discussed trying additional OTC medications, avoiding triggers, eating bland, low fiber foods - Helicobacter pylori special antigen - Ambulatory referral to Gastroenterology  2. Urinary frequency - POCT Urinalysis Dipstick (Automated) - UA does not indicate infection, small blood, will recheck at follow up.   3. Diarrhea, unspecified type - Gastrointestinal Pathogen Panel PCR - Ambulatory referral to Gastroenterology - discussed use of Immodium prn  4. Vitamin B12 deficiency - she is seeing nephrology tomorrow, I have asked that they draw her B12 and vitamin D  5. Vitamin D deficiency - per #4  This visit occurred during the SARS-CoV-2 public health emergency.  Safety protocols were in place, including  screening questions prior to the visit, additional usage of staff PPE, and extensive cleaning of exam room while observing appropriate contact time as indicated for disinfecting solutions.    Clarene Reamer, FNP-BC  [facility]  03/26/2020 3:04 PM

## 2020-03-26 NOTE — Patient Instructions (Addendum)
Good to see you today  I have put in a referral to gastroenterology for your diarrhea

## 2020-03-27 ENCOUNTER — Encounter: Payer: Self-pay | Admitting: Family Medicine

## 2020-03-27 ENCOUNTER — Other Ambulatory Visit (INDEPENDENT_AMBULATORY_CARE_PROVIDER_SITE_OTHER): Payer: Medicare HMO

## 2020-03-27 DIAGNOSIS — I129 Hypertensive chronic kidney disease with stage 1 through stage 4 chronic kidney disease, or unspecified chronic kidney disease: Secondary | ICD-10-CM | POA: Diagnosis not present

## 2020-03-27 DIAGNOSIS — I509 Heart failure, unspecified: Secondary | ICD-10-CM | POA: Diagnosis not present

## 2020-03-27 DIAGNOSIS — R1010 Upper abdominal pain, unspecified: Secondary | ICD-10-CM | POA: Diagnosis not present

## 2020-03-27 DIAGNOSIS — R197 Diarrhea, unspecified: Secondary | ICD-10-CM | POA: Diagnosis not present

## 2020-03-27 DIAGNOSIS — N184 Chronic kidney disease, stage 4 (severe): Secondary | ICD-10-CM | POA: Diagnosis not present

## 2020-04-02 LAB — HELICOBACTER PYLORI  SPECIAL ANTIGEN
MICRO NUMBER:: 11061577
SPECIMEN QUALITY: ADEQUATE

## 2020-04-02 LAB — GASTROINTESTINAL PATHOGEN PANEL PCR

## 2020-04-03 ENCOUNTER — Telehealth: Payer: Self-pay | Admitting: Family Medicine

## 2020-04-03 NOTE — Telephone Encounter (Signed)
Called to set up appointment for phyiscal, daugther stated that is going to call back to schedule it.

## 2020-04-06 ENCOUNTER — Telehealth: Payer: Self-pay | Admitting: Student

## 2020-04-06 ENCOUNTER — Telehealth: Payer: Self-pay

## 2020-04-06 ENCOUNTER — Other Ambulatory Visit: Payer: Self-pay | Admitting: Cardiology

## 2020-04-06 DIAGNOSIS — I5022 Chronic systolic (congestive) heart failure: Secondary | ICD-10-CM | POA: Diagnosis not present

## 2020-04-06 DIAGNOSIS — I10 Essential (primary) hypertension: Secondary | ICD-10-CM

## 2020-04-06 MED ORDER — TORSEMIDE 5 MG PO TABS: 5.0000 mg | ORAL_TABLET | Freq: Every day | ORAL | 3 refills | Status: DC

## 2020-04-06 MED ORDER — NITROGLYCERIN 0.4 MG SL SUBL: 0.4000 mg | SUBLINGUAL_TABLET | SUBLINGUAL | 3 refills | Status: DC | PRN

## 2020-04-06 NOTE — Telephone Encounter (Signed)
Patient called the office complaining of nausea, orthopnea dyspnea on exertion, and substernal and epigastric pain.  Reports the pain improves with eating, she does have a history of GERD, and states this is a similar feeling.  Patient has been experiencing symptoms for the last 1 day.  She reports her blood pressure to be 809-983 systolic.  Patient has not taken her Pepcid.  Instructed patient to try taking Pepcid which she has at home.  If pain does not improve with this she can take sublingual nitroglycerin which I have sent to the pharmacy.  Also prescribed torsemide 5 mg daily and instructed patient to start taking this.  Encouraged her to continue to monitor her blood pressure and notify the office if it remains elevated.  Also asked her to notify the office if symptoms do not improve or worsen.  Will obtain BMP, BNP, magnesium in 1 week.  Patient verbalized understanding and agreement.  S/L NTG was prescribed and explained how to and when to use it and to notify us if there is change in frequency of use.

## 2020-04-06 NOTE — Telephone Encounter (Signed)
[facility] - Client TELEPHONE ADVICE RECORD [facility] Patient Name: [name] Gender: Female DOB: Mar 21, 1951 Age: 69 Y 66 M 24 D Return Phone Number: [phone] (Primary) Address: City/State/Zip: [place] [address] Client [facility] - Client Client Site [place] - Day Physician Tor Netters- NP Contact Type Call Who Is Calling Patient / Member / Family / Caregiver Call Type Triage / Clinical Relationship To Patient Self Return Phone Number [phone] (Primary) Chief Complaint BREATHING - shortness of breath or sounds breathless Reason for Call Symptomatic / Request for Health Information Initial Comment Caller states pt 178/88 bp w/SOB; Translation No Nurse Assessment Nurse: Clovis Riley, RN, Georgina Peer Date/Time (Eastern Time): 04/06/2020 10:28:43 AM Confirm and document reason for call. If symptomatic, describe symptoms. ---Caller states her bp is 178/88 and her bp medication was changed last week. She is having diarrhea and nausea that is chronic. States she has been short of breath since last night. Heartrate was 78. Does the patient have any new or worsening symptoms? ---Yes Will a triage be completed? ---Yes Related visit to physician within the last 2 weeks? ---Yes Does the PT have any chronic conditions? (i.e. diabetes, asthma, this includes High risk factors for pregnancy, etc.) ---Yes List chronic conditions. ---HTN, hx of stroke(FEB), cardiac arrest in Feb Is this a behavioral health or substance abuse call? ---No Guidelines Guideline Title Affirmed Question Affirmed Notes Nurse Date/Time (Eastern Time) Blood Pressure - High [1] Systolic BP >= 308 OR Diastolic >= 657 AND [8] cardiac or neurologic symptoms (e.g., chest pain, difficulty breathing, unsteady gait, blurred vision) [place], RN, Georgina Peer 04/06/2020 10:32:09 AM Disp. Time Eilene Ghazi Time) Disposition Final  User 04/06/2020 10:27:04 AM Send to Urgent Queue Jerrye Beavers 04/06/2020 10:34:14 AM Go to ED Now Yes Clovis Riley, RN, Roslynn Amble NOTE: All timestamps contained within this report are represented as [place] Standard Time. CONFIDENTIALTY NOTICE: This fax transmission is intended only for the addressee. It contains information that is legally privileged, confidential or otherwise protected from use or disclosure. If you are not the intended recipient, you are strictly prohibited from reviewing, disclosing, copying using or disseminating any of this information or taking any action in reliance on or regarding this information. If you have received this fax in error, please notify us immediately by telephone so that we can arrange for its return to [place]. Phone: [phone], Toll-Free: [phone], Fax: [phone] Page: 2 of 2 Call Id: [id number] [place] Disagree/Comply Disagree Caller Understands Yes PreDisposition Did not know what to do Care Advice Given Per Guideline GO TO ED NOW: * You need to be seen in the Emergency Department. * Go to the ED at ___________ [place] now. Drive carefully. NOTE TO TRIAGER - DRIVING: * Another adult should drive. * If immediate transportation is not available via car or taxi, then the patient should be instructed to call [id number]. CARE ADVICE given per High Blood Pressure (Adult) guideline. CALL EMS 911 IF: * Passes out or faints * Becomes confused * Becomes too weak to stand * You become worse Referrals GO TO FACILITY REFUSED

## 2020-04-06 NOTE — Telephone Encounter (Signed)
Noted, cardiology responded.

## 2020-04-06 NOTE — Telephone Encounter (Addendum)
Pt said she she spoke with access nurse and since then pt has talked with cardiology and they took care of it. I asked pt if there was anything we could do for her and she said "no they took care of it." advised pt to cb [facility] if needed. Pt voiced understanding. FYI to Glenda Chroman FNP who is out of office and to Gentry Fitz NP who is in office.

## 2020-04-09 NOTE — Telephone Encounter (Signed)
Noted  

## 2020-04-12 DIAGNOSIS — N184 Chronic kidney disease, stage 4 (severe): Secondary | ICD-10-CM | POA: Diagnosis not present

## 2020-04-13 ENCOUNTER — Telehealth: Payer: Self-pay | Admitting: Student

## 2020-04-13 NOTE — Telephone Encounter (Signed)
Called and spoke to patient's daughter.  Is feeling much better since we last spoke, shortness of breath and chest discomfort have resolved.  She is currently taking torsemide 5 mg daily.  Patient went to [place] yesterday, will monitor for results.  Of note patient is having issues with chronic diarrhea and is scheduled to follow-up with GI in the coming weeks.  Informed patient's daughter our office will be in contact regarding lab results.  Patient will keep scheduled follow-up appointment in January, unless sooner visit is necessary.

## 2020-04-14 ENCOUNTER — Emergency Department (HOSPITAL_COMMUNITY): Payer: Medicare HMO

## 2020-04-14 ENCOUNTER — Other Ambulatory Visit: Payer: Self-pay

## 2020-04-14 ENCOUNTER — Inpatient Hospital Stay (HOSPITAL_COMMUNITY)
Admission: EM | Admit: 2020-04-14 | Discharge: 2020-04-16 | DRG: 291 | Disposition: A | Discharge status: Home / Self Care | Payer: Medicare HMO | Attending: Family Medicine | Admitting: Family Medicine

## 2020-04-14 ENCOUNTER — Observation Stay (HOSPITAL_BASED_OUTPATIENT_CLINIC_OR_DEPARTMENT_OTHER): Payer: Medicare HMO

## 2020-04-14 ENCOUNTER — Encounter (HOSPITAL_COMMUNITY): Payer: Self-pay

## 2020-04-14 DIAGNOSIS — R0602 Shortness of breath: Secondary | ICD-10-CM

## 2020-04-14 DIAGNOSIS — I5023 Acute on chronic systolic (congestive) heart failure: Secondary | ICD-10-CM | POA: Diagnosis present

## 2020-04-14 DIAGNOSIS — E538 Deficiency of other specified B group vitamins: Secondary | ICD-10-CM | POA: Diagnosis present

## 2020-04-14 DIAGNOSIS — I509 Heart failure, unspecified: Secondary | ICD-10-CM | POA: Insufficient documentation

## 2020-04-14 DIAGNOSIS — Z882 Allergy status to sulfonamides status: Secondary | ICD-10-CM

## 2020-04-14 DIAGNOSIS — Z209 Contact with and (suspected) exposure to unspecified communicable disease: Secondary | ICD-10-CM | POA: Diagnosis not present

## 2020-04-14 DIAGNOSIS — J9811 Atelectasis: Secondary | ICD-10-CM | POA: Diagnosis not present

## 2020-04-14 DIAGNOSIS — J9601 Acute respiratory failure with hypoxia: Secondary | ICD-10-CM | POA: Diagnosis present

## 2020-04-14 DIAGNOSIS — Z96651 Presence of right artificial knee joint: Secondary | ICD-10-CM | POA: Diagnosis present

## 2020-04-14 DIAGNOSIS — Z833 Family history of diabetes mellitus: Secondary | ICD-10-CM

## 2020-04-14 DIAGNOSIS — J810 Acute pulmonary edema: Secondary | ICD-10-CM | POA: Diagnosis not present

## 2020-04-14 DIAGNOSIS — F32A Depression, unspecified: Secondary | ICD-10-CM | POA: Diagnosis present

## 2020-04-14 DIAGNOSIS — F419 Anxiety disorder, unspecified: Secondary | ICD-10-CM | POA: Diagnosis present

## 2020-04-14 DIAGNOSIS — I480 Paroxysmal atrial fibrillation: Secondary | ICD-10-CM | POA: Diagnosis present

## 2020-04-14 DIAGNOSIS — Z885 Allergy status to narcotic agent status: Secondary | ICD-10-CM

## 2020-04-14 DIAGNOSIS — Z20822 Contact with and (suspected) exposure to covid-19: Secondary | ICD-10-CM | POA: Diagnosis present

## 2020-04-14 DIAGNOSIS — Z66 Do not resuscitate: Secondary | ICD-10-CM | POA: Diagnosis present

## 2020-04-14 DIAGNOSIS — Z91013 Allergy to seafood: Secondary | ICD-10-CM

## 2020-04-14 DIAGNOSIS — I739 Peripheral vascular disease, unspecified: Secondary | ICD-10-CM | POA: Diagnosis present

## 2020-04-14 DIAGNOSIS — Z8249 Family history of ischemic heart disease and other diseases of the circulatory system: Secondary | ICD-10-CM

## 2020-04-14 DIAGNOSIS — Z79899 Other long term (current) drug therapy: Secondary | ICD-10-CM

## 2020-04-14 DIAGNOSIS — J80 Acute respiratory distress syndrome: Secondary | ICD-10-CM | POA: Diagnosis not present

## 2020-04-14 DIAGNOSIS — K219 Gastro-esophageal reflux disease without esophagitis: Secondary | ICD-10-CM | POA: Diagnosis present

## 2020-04-14 DIAGNOSIS — E876 Hypokalemia: Secondary | ICD-10-CM | POA: Diagnosis not present

## 2020-04-14 DIAGNOSIS — E559 Vitamin D deficiency, unspecified: Secondary | ICD-10-CM | POA: Diagnosis present

## 2020-04-14 DIAGNOSIS — I248 Other forms of acute ischemic heart disease: Secondary | ICD-10-CM | POA: Diagnosis present

## 2020-04-14 DIAGNOSIS — I110 Hypertensive heart disease with heart failure: Secondary | ICD-10-CM | POA: Diagnosis not present

## 2020-04-14 DIAGNOSIS — I130 Hypertensive heart and chronic kidney disease with heart failure and stage 1 through stage 4 chronic kidney disease, or unspecified chronic kidney disease: Secondary | ICD-10-CM | POA: Diagnosis not present

## 2020-04-14 DIAGNOSIS — Z8674 Personal history of sudden cardiac arrest: Secondary | ICD-10-CM

## 2020-04-14 DIAGNOSIS — Z91010 Allergy to peanuts: Secondary | ICD-10-CM

## 2020-04-14 DIAGNOSIS — Z888 Allergy status to other drugs, medicaments and biological substances status: Secondary | ICD-10-CM

## 2020-04-14 DIAGNOSIS — Z7902 Long term (current) use of antithrombotics/antiplatelets: Secondary | ICD-10-CM

## 2020-04-14 DIAGNOSIS — Z7982 Long term (current) use of aspirin: Secondary | ICD-10-CM

## 2020-04-14 DIAGNOSIS — I2510 Atherosclerotic heart disease of native coronary artery without angina pectoris: Secondary | ICD-10-CM | POA: Diagnosis present

## 2020-04-14 DIAGNOSIS — R0689 Other abnormalities of breathing: Secondary | ICD-10-CM | POA: Diagnosis not present

## 2020-04-14 DIAGNOSIS — N184 Chronic kidney disease, stage 4 (severe): Secondary | ICD-10-CM | POA: Diagnosis present

## 2020-04-14 DIAGNOSIS — Z831 Family history of other infectious and parasitic diseases: Secondary | ICD-10-CM

## 2020-04-14 DIAGNOSIS — I5043 Acute on chronic combined systolic (congestive) and diastolic (congestive) heart failure: Secondary | ICD-10-CM | POA: Diagnosis not present

## 2020-04-14 DIAGNOSIS — N179 Acute kidney failure, unspecified: Secondary | ICD-10-CM

## 2020-04-14 DIAGNOSIS — R069 Unspecified abnormalities of breathing: Secondary | ICD-10-CM | POA: Diagnosis not present

## 2020-04-14 DIAGNOSIS — R0902 Hypoxemia: Secondary | ICD-10-CM | POA: Diagnosis not present

## 2020-04-14 DIAGNOSIS — I420 Dilated cardiomyopathy: Secondary | ICD-10-CM | POA: Diagnosis present

## 2020-04-14 DIAGNOSIS — Z91040 Latex allergy status: Secondary | ICD-10-CM

## 2020-04-14 DIAGNOSIS — Z87891 Personal history of nicotine dependence: Secondary | ICD-10-CM

## 2020-04-14 DIAGNOSIS — Z881 Allergy status to other antibiotic agents status: Secondary | ICD-10-CM

## 2020-04-14 DIAGNOSIS — I340 Nonrheumatic mitral (valve) insufficiency: Secondary | ICD-10-CM | POA: Diagnosis not present

## 2020-04-14 DIAGNOSIS — I5020 Unspecified systolic (congestive) heart failure: Secondary | ICD-10-CM

## 2020-04-14 DIAGNOSIS — E785 Hyperlipidemia, unspecified: Secondary | ICD-10-CM | POA: Diagnosis present

## 2020-04-14 DIAGNOSIS — Z8673 Personal history of transient ischemic attack (TIA), and cerebral infarction without residual deficits: Secondary | ICD-10-CM

## 2020-04-14 LAB — COMPREHENSIVE METABOLIC PANEL
ALT: 9 U/L (ref 0–44)
AST: 19 U/L (ref 15–41)
Albumin: 3.9 g/dL (ref 3.5–5.0)
Alkaline Phosphatase: 55 U/L (ref 38–126)
Anion gap: 12 (ref 5–15)
BUN: 19 mg/dL (ref 8–23)
CO2: 25 mmol/L (ref 22–32)
Calcium: 9.2 mg/dL (ref 8.9–10.3)
Chloride: 102 mmol/L (ref 98–111)
Creatinine, Ser: 3.31 mg/dL — ABNORMAL HIGH (ref 0.44–1.00)
GFR, Estimated: 15 mL/min — ABNORMAL LOW (ref >60)
Glucose, Bld: 119 mg/dL — ABNORMAL HIGH (ref 70–99)
Potassium: 4.1 mmol/L (ref 3.5–5.1)
Sodium: 139 mmol/L (ref 135–145)
Total Bilirubin: 0.5 mg/dL (ref 0.3–1.2)
Total Protein: 6.6 g/dL (ref 6.5–8.1)

## 2020-04-14 LAB — CBC WITH DIFFERENTIAL/PLATELET
Abs Immature Granulocytes: 0.02 10*3/uL (ref 0.00–0.07)
Basophils Absolute: 0 10*3/uL (ref 0.0–0.1)
Basophils Relative: 1 %
Eosinophils Absolute: 0.5 10*3/uL (ref 0.0–0.5)
Eosinophils Relative: 8 %
HCT: 34.3 % — ABNORMAL LOW (ref 36.0–46.0)
Hemoglobin: 10.5 g/dL — ABNORMAL LOW (ref 12.0–15.0)
Immature Granulocytes: 0 %
Lymphocytes Relative: 23 %
Lymphs Abs: 1.5 10*3/uL (ref 0.7–4.0)
MCH: 27.7 pg (ref 26.0–34.0)
MCHC: 30.6 g/dL (ref 30.0–36.0)
MCV: 90.5 fL (ref 80.0–100.0)
Monocytes Absolute: 0.4 10*3/uL (ref 0.1–1.0)
Monocytes Relative: 6 %
Neutro Abs: 4.1 10*3/uL (ref 1.7–7.7)
Neutrophils Relative %: 62 %
Platelets: 196 10*3/uL (ref 150–400)
RBC: 3.79 MIL/uL — ABNORMAL LOW (ref 3.87–5.11)
RDW: 14.1 % (ref 11.5–15.5)
WBC: 6.5 10*3/uL (ref 4.0–10.5)
nRBC: 0 % (ref 0.0–0.2)

## 2020-04-14 LAB — I-STAT CHEM 8, ED
BUN: 20 mg/dL (ref 8–23)
Calcium, Ion: 1.27 mmol/L (ref 1.15–1.40)
Chloride: 101 mmol/L (ref 98–111)
Creatinine, Ser: 3.3 mg/dL — ABNORMAL HIGH (ref 0.44–1.00)
Glucose, Bld: 112 mg/dL — ABNORMAL HIGH (ref 70–99)
HCT: 32 % — ABNORMAL LOW (ref 36.0–46.0)
Hemoglobin: 10.9 g/dL — ABNORMAL LOW (ref 12.0–15.0)
Potassium: 4.2 mmol/L (ref 3.5–5.1)
Sodium: 140 mmol/L (ref 135–145)
TCO2: 28 mmol/L (ref 22–32)

## 2020-04-14 LAB — ECHOCARDIOGRAM COMPLETE
Calc EF: 32.1 %
Height: 64.5 in
MV M vel: 5.9 m/s
MV Peak grad: 139.2 mmHg
Radius: 0.4 cm
S' Lateral: 5.3 cm
Single Plane A2C EF: 51.4 %
Single Plane A4C EF: 17.3 %
Weight: 2256 oz

## 2020-04-14 LAB — TROPONIN I (HIGH SENSITIVITY)
Troponin I (High Sensitivity): 29 ng/L — ABNORMAL HIGH (ref <18)
Troponin I (High Sensitivity): 36 ng/L — ABNORMAL HIGH (ref <18)
Troponin I (High Sensitivity): 38 ng/L — ABNORMAL HIGH (ref <18)
Troponin I (High Sensitivity): 40 ng/L — ABNORMAL HIGH (ref <18)

## 2020-04-14 LAB — RESPIRATORY PANEL BY RT PCR (FLU A&B, COVID)
Influenza A by PCR: NEGATIVE
Influenza B by PCR: NEGATIVE
SARS Coronavirus 2 by RT PCR: NEGATIVE

## 2020-04-14 LAB — BRAIN NATRIURETIC PEPTIDE: B Natriuretic Peptide: 2779 pg/mL — ABNORMAL HIGH (ref 0.0–100.0)

## 2020-04-14 MED ORDER — ASPIRIN 81 MG PO CHEW
81.0000 mg | CHEWABLE_TABLET | Freq: Every day | ORAL | Status: DC
  Administered 2020-04-14 – 2020-04-16 (×3): 81 mg via ORAL
  Filled 2020-04-14 (×3): qty 1

## 2020-04-14 MED ORDER — CARVEDILOL 6.25 MG PO TABS
6.2500 mg | ORAL_TABLET | Freq: Two times a day (BID) | ORAL | Status: DC
  Administered 2020-04-14 – 2020-04-15 (×2): 6.25 mg via ORAL
  Filled 2020-04-14 (×2): qty 1

## 2020-04-14 MED ORDER — NITROGLYCERIN 0.4 MG SL SUBL: 0.4000 mg | SUBLINGUAL_TABLET | SUBLINGUAL | Status: DC | PRN

## 2020-04-14 MED ORDER — SODIUM CHLORIDE 0.9 % IV SOLN
1.0000 g | Freq: Once | INTRAVENOUS | Status: AC
  Administered 2020-04-14: 1 g via INTRAVENOUS
  Filled 2020-04-14: qty 10

## 2020-04-14 MED ORDER — FUROSEMIDE 10 MG/ML IJ SOLN
60.0000 mg | Freq: Two times a day (BID) | INTRAMUSCULAR | Status: DC
  Administered 2020-04-14: 60 mg via INTRAVENOUS

## 2020-04-14 MED ORDER — ONDANSETRON HCL 4 MG/2ML IJ SOLN
4.0000 mg | Freq: Four times a day (QID) | INTRAMUSCULAR | Status: DC | PRN
  Administered 2020-04-15: 4 mg via INTRAVENOUS
  Filled 2020-04-14: qty 2

## 2020-04-14 MED ORDER — APIXABAN 5 MG PO TABS
5.0000 mg | ORAL_TABLET | Freq: Two times a day (BID) | ORAL | Status: DC
  Administered 2020-04-14 – 2020-04-16 (×5): 5 mg via ORAL
  Filled 2020-04-14 (×5): qty 1

## 2020-04-14 MED ORDER — PERFLUTREN LIPID MICROSPHERE
1.0000 mL | INTRAVENOUS | Status: AC | PRN
  Administered 2020-04-14: 2 mL via INTRAVENOUS
  Filled 2020-04-14: qty 10

## 2020-04-14 MED ORDER — SODIUM CHLORIDE 0.9 % IV SOLN: 250.0000 mL | INTRAVENOUS | Status: DC | PRN

## 2020-04-14 MED ORDER — HYDRALAZINE HCL 25 MG PO TABS
25.0000 mg | ORAL_TABLET | Freq: Three times a day (TID) | ORAL | Status: DC | PRN
  Administered 2020-04-14: 25 mg via ORAL
  Filled 2020-04-14: qty 1

## 2020-04-14 MED ORDER — ROSUVASTATIN CALCIUM 5 MG PO TABS
10.0000 mg | ORAL_TABLET | Freq: Every day | ORAL | Status: DC
  Administered 2020-04-14 – 2020-04-15 (×2): 10 mg via ORAL
  Filled 2020-04-14 (×2): qty 2

## 2020-04-14 MED ORDER — VITAMIN D 25 MCG (1000 UNIT) PO TABS
1000.0000 [IU] | ORAL_TABLET | Freq: Every day | ORAL | Status: DC
  Administered 2020-04-14 – 2020-04-16 (×3): 1000 [IU] via ORAL
  Filled 2020-04-14 (×4): qty 1

## 2020-04-14 MED ORDER — ACETAMINOPHEN 325 MG PO TABS
650.0000 mg | ORAL_TABLET | ORAL | Status: DC | PRN
  Administered 2020-04-14: 650 mg via ORAL
  Filled 2020-04-14: qty 2

## 2020-04-14 MED ORDER — EZETIMIBE 10 MG PO TABS
10.0000 mg | ORAL_TABLET | Freq: Every day | ORAL | Status: DC
  Administered 2020-04-14 – 2020-04-16 (×3): 10 mg via ORAL
  Filled 2020-04-14 (×3): qty 1

## 2020-04-14 MED ORDER — SODIUM CHLORIDE 0.9% FLUSH
3.0000 mL | Freq: Two times a day (BID) | INTRAVENOUS | Status: DC
  Administered 2020-04-14 – 2020-04-16 (×5): 3 mL via INTRAVENOUS

## 2020-04-14 MED ORDER — SODIUM CHLORIDE 0.9% FLUSH: 3.0000 mL | INTRAVENOUS | Status: DC | PRN

## 2020-04-14 MED ORDER — FUROSEMIDE 10 MG/ML IJ SOLN
INTRAMUSCULAR | Status: AC
  Administered 2020-04-14: 60 mg via INTRAVENOUS
  Filled 2020-04-14: qty 8

## 2020-04-14 MED ORDER — FUROSEMIDE 10 MG/ML IJ SOLN
40.0000 mg | Freq: Once | INTRAMUSCULAR | Status: AC
  Administered 2020-04-14: 40 mg via INTRAVENOUS
  Filled 2020-04-14: qty 4

## 2020-04-14 MED ORDER — SERTRALINE HCL 50 MG PO TABS
50.0000 mg | ORAL_TABLET | Freq: Every day | ORAL | Status: DC
  Administered 2020-04-14 – 2020-04-15 (×2): 50 mg via ORAL
  Filled 2020-04-14 (×2): qty 1

## 2020-04-14 MED ORDER — FAMOTIDINE 20 MG PO TABS
20.0000 mg | ORAL_TABLET | Freq: Every day | ORAL | Status: DC
  Administered 2020-04-14 – 2020-04-16 (×3): 20 mg via ORAL
  Filled 2020-04-14 (×3): qty 1

## 2020-04-14 MED ORDER — VITAMIN B-12 1000 MCG PO TABS
5000.0000 ug | ORAL_TABLET | Freq: Every day | ORAL | Status: DC
  Administered 2020-04-15 – 2020-04-16 (×2): 5000 ug via ORAL
  Filled 2020-04-14 (×2): qty 5

## 2020-04-14 MED ORDER — ORAL CARE MOUTH RINSE
15.0000 mL | Freq: Two times a day (BID) | OROMUCOSAL | Status: DC
  Administered 2020-04-14 – 2020-04-16 (×4): 15 mL via OROMUCOSAL

## 2020-04-14 NOTE — H&P (Signed)
History and Physical   [name] DXA:[id number] DOB: December 10, 1950 DOA: 04/14/2020  PCP: Elby Beck, FNP  Patient coming from: home, lives with her spouse  I have personally briefly reviewed patient's old medical records in [place].  Chief Concern: shortness of breath  HPI: [name] is a 69 y.o. female with medical history significant for hypertension, hyperlipidemia, former tobacco user, history of Arnold-Chiari malformation, history of VF/VT, cardiac arrest, history of torsades, nonischemic cardiomyopathy, peripheral vascular disease, paroxysmal atrial fibrillation on Eliquis, left bundle branch block, history of CVA presented to the emergency department for chief concern of shortness of breath.  She states the shortness of breath is worse with laying flat and with exertion.  She reports that she has been short of breath before, like in March 2021.  She reports that for the last 4 to 5 days she has required 2 pillows stacked in order to go to the sleep at night.  She endorses that sitting upright makes her feel better.  She endorses that she has been taking at least 3-4 bottles of water per day in an effort to help with her renal function.  She endorses that she recently was started on torsemide about 4 days ago and that she has been compliant.  She denies swelling in bilateral lower extremities.  Review of system was positive for persistent nausea, productive cough that is frothy and white, feelings of fullness in the epigastric region (she has appointment with GI specialist next week), persistent loose and watery stools since February 2021.  Review of system was negative for headache, vision changes, fever, chills, vomiting, chest pain, abdominal pain, numbness in extremities, constipation, dysuria, hematuria, lower extremity edema.  Chart review: Left heart catheterization on 08/04/2019 showed global hypokinesis, EF of 25 to 30%, proximal LAD has a 30 to 40% stenosis,  mid segment has a 20 to 30% stenosis, scattered disease noted on the LAD.  Circumflex shows scattered disease, mid segment at 30 to 40% stenosis appearing eccentric and calcified.  RCA is the dominant showing diffuse mild disease.  Mid segment after the origin of the RV branch has a 70 to 80% stenosis.  Brisk flow evident throughout RCA.  Impression: Consistent with nonischemic cardiomyopathy.  Significant disease in the right coronary artery, this does not explain the presentation of global hypokinesis, neither does it explain VF arrest as the lesion does not appear to be stable.  Echo with bubble study on 08/04/2019: Showed EF 25 to 30%, left ventricle demonstrates global hypokinesis, marked septal to lateral dyssynchrony consistent with LBBB.  No left ventricular thrombus noted.  Right ventricular systolic function is normal.  Right ventricular size is normal.  Normal pulmonary artery systolic pressure.  Aortic valve is tricuspid and regurgitation is not visualized.  No aortic stenosis present.  Bubble study appeared negative for shunt lesion but was difficult study due to poor windows.  Social history: Lives with her spouse, is a retired [employer] manager, former tobacco user (1 pack/day for 50 years), quit tobacco use in February 2021.  Denies alcohol and recreational drug use.  Family history: Heart disease in mother.  Hypertension in sister.  ED Course: Discussed with ED provider, admit patient for heart failure exacerbation and volume overload causing shortness of breath.  ED vitals showed elevated respiration rate of 21, heart rate 69, blood pressure 178/93, satting at 98% on 2 L nasal cannula.  Per ED provider when EMS found her she was satting at 88% on room air.  Patient does not require home O2 at baseline.  Portable chest x-ray in the ED read as mild interstitial thickening with additional hazy lung base opacity, greater on the right.  Suspect mild congestive heart failure with presumed small  pleural effusion and basilar atelectasis.  Consider lung base pneumonia, particularly on the right.  BNP elevated at 2779; troponin high-sensitivity is 29 (03/06/20: 15, 08/15/19: 24, 08/14/19: 21)  In the ED, ceftriaxone IV and Lasix 40 mg IV were ordered.  Review of Systems: As per HPI otherwise 10 point review of systems negative.   Past Medical History:  Diagnosis Date  . Allergy   . Anxiety   . Arnold-Chiari malformation ([place])   . Arthritis   . CHF (congestive heart failure) ([place])   . Chronic systolic heart failure ([place]) 11/03/2019  . Coronary artery disease   . Encounter for assessment of implantable cardioverter-defibrillator (ICD) 11/10/2019  . GERD (gastroesophageal reflux disease)   . H. pylori infection    3 years ago  . H/O cardiac arrest    Hx of VT/Vfib arrest  . Hyperlipidemia   . ICD BiV Medtronic model [id number] Claria Quad CRT-D SureScan ICD 08/25/2019   . Incontinence   . Paroxysmal atrial fibrillation ([place]) 08/06/2019  . Syncope and collapse    Per pt, denies passing out  . Tobacco use disorder   . Unspecified essential hypertension    Past Surgical History:  Procedure Laterality Date  . APPENDECTOMY    . ARNOLD CHIARI SURGERY     neurocranial surgery  . BIV ICD INSERTION CRT-D N/A 08/25/2019   Procedure: BIV ICD INSERTION CRT-D;  Surgeon: Constance Haw, MD;  Location: [facility];  Service: Cardiovascular;  Laterality: N/A;  . BLEPHAROPLASTY     Bil  . C-EYE SURGERY PROCEDURE    . CARDIAC CATHETERIZATION    . CHOLECYSTECTOMY    . EYE SURGERY    . heart failure    . LEFT HEART CATH AND CORONARY ANGIOGRAPHY N/A 08/04/2019   Procedure: LEFT HEART CATH AND CORONARY ANGIOGRAPHY;  Surgeon: Adrian Prows, MD;  Location: [facility];  Service: Cardiovascular;  Laterality: N/A;  . MASS EXCISION Right 12/27/2013   Procedure: MINOR EXCISION OF RIGHT THUMB MUCOID CYST, DEBRIDEMENT OF INTERPHALANGEAL JOINT;  Surgeon: Cammie Sickle, MD;  Location:  [facility];  Service: Orthopedics;  Laterality: Right;  . mass on thumb  right  . TOTAL KNEE ARTHROPLASTY Right 02/17/2017  . TOTAL KNEE ARTHROPLASTY Right 02/17/2017   Procedure: TOTAL KNEE ARTHROPLASTY;  Surgeon: Melrose Nakayama, MD;  Location: [facility];  Service: Orthopedics;  Laterality: Right;  . TUBAL LIGATION     Social History:  reports that she quit smoking about 8 months ago. Her smoking use included cigarettes. She has a 50.00 pack-year smoking history. She has never used smokeless tobacco. She reports that she does not drink alcohol and does not use drugs.  Allergies  Allergen Reactions  . Shellfish-Derived Products Anaphylaxis  . Ativan [Lorazepam] Other (See Comments)    Makes "skin crawl" and insomnia  . Buspar [Buspirone] Nausea Only    Sweating and dizzy  . Clarithromycin Swelling  . Codeine Nausea And Vomiting  . Doxycycline Other (See Comments)    Unknown  . Guaifenesin Other (See Comments)    Palpitations  . Oxycodone Nausea And Vomiting  . Prednisone Nausea And Vomiting and Other (See Comments)    Makes my heart race  . Statins Other (See Comments)  Muscle pain  . Sulfonamide Derivatives Nausea And Vomiting    Achiness  . Tetracycline Nausea Only  . Vicodin [Hydrocodone-Acetaminophen] Nausea And Vomiting  . Famotidine Rash  . Latex Rash  . Omeprazole Nausea Only    Cough, shortness of breath - patient doesn't remember  . Peanut-Containing Drug Products Itching and Rash  . Protonix [Pantoprazole] Rash  . Wellbutrin [Bupropion] Palpitations   Family History  Problem Relation Age of Onset  . Bone cancer Mother   . Heart disease Mother   . Cancer Sister        metastatic; unknown primary  . Diverticulosis Sister   . Tuberculosis Father   . Diabetes Sister   . Hypertension Sister   . Colon cancer Neg Hx   . Esophageal cancer Neg Hx   . Pancreatic cancer Neg Hx   . Stomach cancer Neg Hx   . Liver disease Neg Hx   . Kidney disease Neg  Hx   . Rectal cancer Neg Hx    Family history: Family history reviewed and pertinent in H&P.  Prior to Admission medications   Medication Sig Start Date End Date Taking? Authorizing Provider  acetaminophen (TYLENOL) 500 MG tablet Take 1,000 mg by mouth 2 (two) times daily as needed (pain).   Yes [provider]  apixaban (ELIQUIS) 5 MG TABS tablet Take 1 tablet (5 mg total) by mouth 2 (two) times daily. 12/08/19  Yes Tolia, Sunit, DO  aspirin 81 MG chewable tablet Chew 1 tablet (81 mg total) by mouth daily. 08/08/19  Yes Alma Friendly, MD  carvedilol (COREG) 12.5 MG tablet TAKE 1 TABLET (12.5 MG TOTAL) BY MOUTH 2 (TWO) TIMES DAILY WITH A MEAL. Patient taking differently: Take 6.25 mg by mouth 2 (two) times daily with a meal.  03/05/20  Yes Tolia, Sunit, DO  Cholecalciferol (VITAMIN D3 PO) Take 1,000 Units by mouth in the morning and at bedtime.   Yes [provider]  Cyanocobalamin (B12 FAST DISSOLVE PO) Take 1 tablet by mouth daily.    Yes [provider]  docusate sodium (COLACE) 100 MG capsule Take 1 capsule (100 mg total) by mouth daily as needed for mild constipation. 09/02/19  Yes Tolia, Sunit, DO  ezetimibe (ZETIA) 10 MG tablet TAKE 1 TABLET BY MOUTH EVERY DAY Patient taking differently: Take 10 mg by mouth daily.  12/23/19  Yes Tolia, Sunit, DO  Famotidine (PEPCID PO) Take 20 mg by mouth in the morning and at bedtime.    Yes [provider]  loratadine (CLARITIN) 10 MG tablet Take 10 mg by mouth daily as needed for allergies.    Yes [provider]  Magnesium 400 MG TABS Take 400 mg by mouth 2 (two) times daily.  09/29/19  Yes [provider]  nitroGLYCERIN (NITROSTAT) 0.4 MG SL tablet Place 1 tablet (0.4 mg total) under the tongue every 5 (five) minutes as needed for chest pain. 04/06/20 07/05/20 Yes Cantwell, Celeste C, PA-C  ondansetron (ZOFRAN ODT) 4 MG disintegrating tablet Take 1 tablet (4 mg total) by mouth every 8 (eight) hours  as needed for nausea or vomiting. 03/26/20  Yes Elby Beck, FNP  rosuvastatin (CRESTOR) 10 MG tablet TAKE 1 TABLET (10 MG TOTAL) BY MOUTH DAILY AT 6 PM. 12/23/19 04/14/20 Yes Tolia, Sunit, DO  sertraline (ZOLOFT) 50 MG tablet Take 1 tablet (50 mg total) by mouth at bedtime. To start medication- take 1/2 tablet at bedtime for 7 days then increase to 1 tablet at  bedtime. Patient taking differently: Take 50 mg by mouth at bedtime.  01/19/20  Yes Elby Beck, FNP  torsemide (DEMADEX) 5 MG tablet Take 1 tablet (5 mg total) by mouth daily. 04/06/20  Yes Cantwell, Gerline Legacy, PA-C   Physical Exam: Vitals:   04/14/20 0545 04/14/20 0600 04/14/20 0615 04/14/20 0624  BP:    (!) 148/71  Pulse: 70 69 69 70  Resp: 18 20 20 17   Temp:      TempSrc:      SpO2: 92% 90% 92% 92%  Weight:      Height:       Constitutional: NAD, calm, comfortable Eyes: PERRL, lids and conjunctivae normal ENMT: Mucous membranes are moist. Posterior pharynx clear of any exudate or lesions. Normal dentition.  Neck: normal, supple, no masses, no thyromegaly Respiratory: clear to auscultation bilaterally, no wheezing, no crackles. Normal respiratory effort. No accessory muscle use. With nasal cannula in place Cardiovascular: Regular rate and rhythm, no murmurs / rubs / gallops. No extremity edema. 2+ pedal pulses. No carotid bruits. Presence of subcutaneous device in left upper anterior chest wall that appears negative for crepitus, tenderness, edema, erythema, discharge Abdomen: mildly obese abdomen, no tenderness, no masses palpated. No hepatosplenomegaly. Bowel sounds positive.  Musculoskeletal: no clubbing / cyanosis. No joint deformity upper and lower extremities. Good ROM, no contractures. Normal muscle tone.  Skin: no rashes, lesions, ulcers. No induration Neurologic: CN 2-12 grossly intact. Sensation intact, DTR normal. Strength 5/5 in all 4.  Psychiatric: Normal judgment and insight. Alert and oriented x 3.  Normal mood.   Labs on Admission: I have personally reviewed following labs and imaging studies  CBC: Recent Labs  Lab 04/14/20 0529 04/14/20 0540  WBC 6.5  --   NEUTROABS 4.1  --   HGB 10.5* 10.9*  HCT 34.3* 32.0*  MCV 90.5  --   PLT 196  --    Basic Metabolic Panel: Recent Labs  Lab 04/14/20 0529 04/14/20 0540  NA 139 140  K 4.1 4.2  CL 102 101  CO2 25  --   GLUCOSE 119* 112*  BUN 19 20  CREATININE 3.31* 3.30*  CALCIUM 9.2  --    GFR: Estimated Creatinine Clearance: 14.2 mL/min (A) (by C-G formula based on SCr of 3.3 mg/dL (H)).  Liver Function Tests: Recent Labs  Lab 04/14/20 0529  AST 19  ALT 9  ALKPHOS 55  BILITOT 0.5  PROT 6.6  ALBUMIN 3.9   BNP (last 3 results) Recent Labs    10/20/19 1208 11/10/19 1507 11/17/19 1031  PROBNP 3,188* 18,452* 13,138*   Urine analysis:    Component Value Date/Time   COLORURINE STRAW (A) 08/14/2019 2345   APPEARANCEUR CLEAR 08/14/2019 2345   LABSPEC 1.004 (L) 08/14/2019 2345   PHURINE 6.0 08/14/2019 2345   GLUCOSEU NEGATIVE 08/14/2019 2345   HGBUR NEGATIVE 08/14/2019 2345   HGBUR negative 03/01/2007 1207   BILIRUBINUR negative 03/26/2020 [place] 08/14/2019 2345   PROTEINUR Negative 03/26/2020 1543   PROTEINUR NEGATIVE 08/14/2019 2345   UROBILINOGEN 0.2 03/26/2020 1543   UROBILINOGEN 0.2 01/12/2011 1203   NITRITE negative 03/26/2020 1543   NITRITE NEGATIVE 08/14/2019 2345   LEUKOCYTESUR Negative 03/26/2020 1543   LEUKOCYTESUR NEGATIVE 08/14/2019 2345   Radiological Exams on Admission: Personally reviewed and I agree with radiologist reading as below.  DG Chest Portable 1 View  Result Date: 04/14/2020 CLINICAL DATA:  Patient arrives [facility] for shortness of breath, hx of CHF and MI,  reports she was feeling more short of breath last night, started lasix this week, EMS found her 85% on room air, 100% on NRB, atrially paced. EXAM: PORTABLE CHEST 1 VIEW COMPARISON:  03/06/2020 FINDINGS:  Cardiac silhouette mildly enlarged. Stable left anterior chest wall biventricular cardioverter-defibrillator. No mediastinal or hilar masses. Mild interstitial thickening most evident in the lung bases. There is additional hazy opacity at both lung bases, greater on the right with the hemidiaphragm is partly obscured. No pneumothorax. Skeletal structures are grossly intact. IMPRESSION: 1. Mild interstitial thickening with additional hazy lung base opacity, the latter greater on the right. Suspect mild congestive heart failure with presumed small pleural effusions and basilar atelectasis. Consider lung base pneumonia, particularly on the right, if there are consistent clinical findings. Electronically Signed   By: Lajean Manes M.D.   On: 04/14/2020 05:37   EKG: Independently reviewed, showing atrial paced rhythm  Assessment/Plan  Active Problems:   Heart failure (HCC)   Acute on chronic heart failure reduced ejection fraction -Complete echo ordered and read as: EF is estimated at 25 to 30%, left ventricular function is severely decreased, LV demonstrates global hypokinesis, small pericardial effusion is present anterior to the right ventricle and localized in the right atrium, large pleural effusion in the left lateral region -Consideration given to the primary provider to call cardiology for reevaluation and optimization of heart failure medication and for large pleural effusion in the left lateral region, currently she is hemodynamically stable, no alternans noted on EKG -We will trend high sensitive troponin -Holding home torsemide 5 mg twice daily-patient did not take her evening dose prior to ED presentation -Resume carvedilol 12.5 mg, rosuvastatin 10 mg nightly, ezetimibe, famotidine, nitro as needed, sertraline -40 mg of IV Lasix was ordered in the ED we will continue this -Add an additional 60 mg IV Lasix now-depending on her output, she will likely need 80 mg of IV Lasix tomorrow -Monitor  strict I's and O's -No ACE or ARB at this time due to renal function and acute increase in diuretics -We will continue to monitor clinical progress, and if indicated we will consult cardiology for initiation of ACE/ARB/Entresto -Admit to inpatient with telemetry  Elevated troponin -Initial high-sensitivity troponin was 29 and increased to 36 -Patient not complaining of chest pain low clinical suspicion for ACS at this time however given patient's history of VT VF arrest we will cycle high sensitive troponin II more times  Radiographic concerns for pneumonia- -On clinical exam and H&P patient was negative for fever, changes to productive nature of cough, chills, and no sick contact -No indications for ABX at this time -ED ordered 2 blood cultures we will continue to follow these  Acute on chronic CKD -Patient still making urine -Baseline creatinine since June has been 2.25-2.73 -Her creatinine function has been steadily declining since 08/02/2019 -She appears to have normal renal function in February 2021 (Cr 1.10/ GFR > 60) -She will benefit from outpatient nephrology follow-up -No urgent need for nephrology consult at this time -We will continue to monitor this closely and consult nephrology if indicated  Paroxysmal atrial fibrillation-continue Eliquis 5 mg twice daily, carvedilol  Hypertension-elevated -Resume carvedilol 6.25 mg twice daily -Hydralazine p.o. as needed for SBP greater than 160  Hyperlipidemia-resume home rosuvastatin 10 mg nightly and ezetimibe 10 mg daily  History of VF/VT arrest with dilated nonischemic cardiomyopathy status post biventricular ICD implantation on 08/25/2019, ICD BiV Medtronic model [id number] Claria Quad CRT-D SureScan ICD  Anxiety/depression-denies SI and HI -  Resumed home sertraline 50 mg nightly  GERD-follow-up outpatient with GI specialist -Resumed home famotidine  Vitamin D deficiency-resumed home supplements History of vitamin B12  deficiency-resumed home supplements  Current oxygen supplement requirement-we will consider transition of care manager consult if her oxygen supplementation requirement appears to be more chronic in nature -However based on clinical presentation and physical exam patient appears to be in volume overload from heart failure exacerbation and may not need full-time oxygen supplementation once we diurese her  DVT prophylaxis: On full dose Eliquis Code Status: DNR Diet: Strict I's and O's, heart healthy with 1500 cc fluid restriction Family Communication: Patient is calling husband to update Disposition Plan: Pending clinical course Consults called: None at this time Admission status: Inpatient with telemetry  [place] Code N Dayquan Buys D.O. [facility]  If 7AM-7PM, please contact day-coverage provider [email or web address]  04/14/2020, 8:15 AM

## 2020-04-14 NOTE — ED Triage Notes (Signed)
Patient arrives [facility] for shortness of breath, hx of CHF and MI, reports she was feeling more short of breath last night, started lasix this week, EMS found her 85% on room air, 100% on NRB, atrially paced.  EMS VITALS 80 HR 160/90 22-30 RR CBG 143 100% NRB 20 L A/C

## 2020-04-14 NOTE — ED Notes (Signed)
Lunch Tray Ordered @ 1009. 

## 2020-04-14 NOTE — ED Provider Notes (Signed)
[facility] EMERGENCY DEPARTMENT Provider Note   CSN: [id number] Arrival date & Jackson: 04/14/20  0447     History Chief Complaint  Patient presents with  . Shortness of Breath    [name] is a 69 y.o. female.  The history is provided by the patient Janice the EMS personnel. The history is limited by the condition of the patient.  Shortness of Breath Severity:  Severe Onset quality:  Gradual Duration:  1 day Timing:  Constant Progression:  Worsening Chronicity:  Recurrent Context: not URI   Relieved by:  Nothing Worsened by:  Nothing Ineffective treatments:  None tried Associated symptoms: no abdominal pain, no cough, no ear pain, no fever, no PND Janice no vomiting   Risk factors: no recent alcohol use        Past Medical History:  Diagnosis Date  . Allergy   . Anxiety   . Arnold-Chiari malformation ([place])   . Arthritis   . CHF (congestive heart failure) ([place])   . Chronic systolic heart failure ([place]) 11/03/2019  . Coronary artery disease   . Encounter for assessment of implantable cardioverter-defibrillator (ICD) 11/10/2019  . GERD (gastroesophageal reflux disease)   . H. pylori infection    3 years ago  . H/O cardiac arrest    Hx of VT/Vfib arrest  . Hyperlipidemia   . ICD BiV Medtronic model [id number] Cl[place] Quad CRT-D SureScan ICD 08/25/2019   . Incontinence   . Paroxysmal atrial fibrillation ([place]) 08/06/2019  . Syncope Janice collapse    Per pt, denies passing out  . Tobacco use disorder   . Unspecified essential hypertension     Patient Active Problem List   Diagnosis Date Noted  . Encounter for assessment of implantable cardioverter-defibrillator (ICD) 11/10/2019  . H/O cardiac arrest 08/03/2019 11/10/2019  . Coronary artery disease involving native coronary artery of native heart without angina pectoris 11/03/2019  . Benign hypertensive CKD, stage 1-4 or unspecified chronic kidney disease 11/03/2019  . Chronic systolic heart failure  ([place]) 11/03/2019  . Lightheadedness 11/03/2019  . CHF (congestive heart failure) ([place]) 10/18/2019  . Pruritus 09/01/2019  . ICD BiV Medtronic model [id number] Claria Quad CRT-D SureScan ICD 08/25/2019   . Cardiac arrest with ventricular fibrillation ([place]) 08/24/2019  . TIA (transient ischemic attack) 08/15/2019  . Long term current use of anticoagulant 08/14/2019  . Cardiomyopathy ([place]) 08/14/2019  . Paroxysmal atrial fibrillation ([place]) 08/06/2019  . Statin intolerance 08/03/2019  . Dysphagia   . History of CVA (cerebrovascular accident) 08/02/2019  . Hypokalemia 08/02/2019  . Left bundle branch block 08/02/2019  . CKD (chronic kidney disease) stage 3, GFR 30-59 ml/min ([place]) 12/13/2018  . Primary localized osteoarthritis of right knee 02/17/2017  . Primary osteoarthritis of right knee 02/17/2017  . Preventative health care 09/01/2016  . Vitamin B12 deficiency 09/10/2015  . Insomnia 07/16/2015  . GERD (gastroesophageal reflux disease) 03/26/2015  . Fatigue 10/02/2014  . Other malaise Janice fatigue 02/08/2014  . HLD (hyperlipidemia) 11/19/2011  . Vaginal dryness, menopausal 11/19/2011  . Adjustment disorder 01/08/2011  . Former smoker 12/19/2008  . Essential hypertension 12/19/2008    Past Surgical History:  Procedure Laterality Date  . APPENDECTOMY    . ARNOLD CHIARI SURGERY     neurocranial surgery  . BIV ICD INSERTION CRT-D N/A 08/25/2019   Procedure: BIV ICD INSERTION CRT-D;  Surgeon: Constance Haw, MD;  Location: [facility];  Service: Cardiovascular;  Laterality: N/A;  . BLEPHAROPLASTY  Bil  . C-EYE SURGERY PROCEDURE    . CARDIAC CATHETERIZATION    . CHOLECYSTECTOMY    . EYE SURGERY    . heart failure    . LEFT HEART CATH Janice CORONARY ANGIOGRAPHY N/A 08/04/2019   Procedure: LEFT HEART CATH Janice CORONARY ANGIOGRAPHY;  Surgeon: Adrian Prows, MD;  Location: [facility];  Service: Cardiovascular;  Laterality: N/A;  . MASS EXCISION Right 12/27/2013    Procedure: MINOR EXCISION OF RIGHT THUMB MUCOID CYST, DEBRIDEMENT OF INTERPHALANGEAL JOINT;  Surgeon: Cammie Sickle, MD;  Location: [facility];  Service: Orthopedics;  Laterality: Right;  . mass on thumb  right  . TOTAL KNEE ARTHROPLASTY Right 02/17/2017  . TOTAL KNEE ARTHROPLASTY Right 02/17/2017   Procedure: TOTAL KNEE ARTHROPLASTY;  Surgeon: Melrose Nakayama, MD;  Location: [facility];  Service: Orthopedics;  Laterality: Right;  . TUBAL LIGATION       OB History   No obstetric history on file.     Family History  Problem Relation Age of Onset  . Bone cancer Mother   . Heart disease Mother   . Cancer Sister        metastatic; unknown primary  . Diverticulosis Sister   . Tuberculosis Father   . Diabetes Sister   . Hypertension Sister   . Colon cancer Neg Hx   . Esophageal cancer Neg Hx   . Pancreatic cancer Neg Hx   . Stomach cancer Neg Hx   . Liver disease Neg Hx   . Kidney disease Neg Hx   . Rectal cancer Neg Hx     Social History   Tobacco Use  . Smoking status: Former Smoker    Packs/day: 1.00    Years: 50.00    Pack years: 50.00    Types: Cigarettes    Quit date: 08/08/2019    Years since quitting: 0.6  . Smokeless tobacco: Never Used  Vaping Use  . Vaping Use: Never used  Substance Use Topics  . Alcohol use: No    Alcohol/week: 0.0 standard drinks  . Drug use: No    Home Medications Prior to Admission medications   Medication Sig Start Date End Date Taking? Authorizing Provider  acetaminophen (TYLENOL) 500 MG tablet Take 1,000 mg by mouth 2 (two) times daily as needed (pain).    [provider]  apixaban (ELIQUIS) 5 MG TABS tablet Take 1 tablet (5 mg total) by mouth 2 (two) times daily. 12/08/19   Tolia, Sunit, DO  aspirin 81 MG chewable tablet Chew 1 tablet (81 mg total) by mouth daily. 08/08/19   Alma Friendly, MD  carvedilol (COREG) 12.5 MG tablet TAKE 1 TABLET (12.5 MG TOTAL) BY MOUTH 2 (TWO) TIMES DAILY WITH A MEAL. 03/05/20    Tolia, Sunit, DO  Cholecalciferol (VITAMIN D3 PO) Take 1,000 Units by mouth in the morning Janice at bedtime.    [provider]  Cyanocobalamin (B12 FAST DISSOLVE PO) Take by mouth daily.    [provider]  docusate sodium (COLACE) 100 MG capsule Take 1 capsule (100 mg total) by mouth daily as needed for mild constipation. 09/02/19   Tolia, Sunit, DO  ezetimibe (ZETIA) 10 MG tablet TAKE 1 TABLET BY MOUTH EVERY DAY 12/23/19   Tolia, Sunit, DO  Famotidine (PEPCID PO) Take by mouth in the morning Janice at bedtime.    [provider]  loratadine (CLARITIN) 10 MG tablet Take 10 mg by mouth daily.    [provider]  Magnesium 200 MG TABS Take 400 mg by mouth 2 (two) times daily.  09/29/19   [provider]  nitroGLYCERIN (NITROSTAT) 0.4 MG SL tablet [name] 1 tablet (0.4 mg total) under the tongue every 5 (five) minutes as needed for chest pain. 04/06/20 07/05/20  Cantwell, Celeste C, PA-C  ondansetron (ZOFRAN ODT) 4 MG disintegrating tablet Take 1 tablet (4 mg total) by mouth every 8 (eight) hours as needed for nausea or vomiting. 03/26/20   Elby Beck, FNP  rosuvastatin (CRESTOR) 10 MG tablet TAKE 1 TABLET (10 MG TOTAL) BY MOUTH DAILY AT 6 PM. 12/23/19 03/02/20  Tolia, Sunit, DO  sertraline (ZOLOFT) 50 MG tablet Take 1 tablet (50 mg total) by mouth at bedtime. To start medication- take 1/2 tablet at bedtime for 7 days then increase to 1 tablet at bedtime. 01/19/20   Elby Beck, FNP  torsemide (DEMADEX) 5 MG tablet Take 1 tablet (5 mg total) by mouth daily. 04/06/20   Cantwell, Celeste C, PA-C    Allergies    Shellfish-derived products, Ativan [lorazepam], Buspar [buspirone], Clarithromycin, Codeine, Doxycycline, Guaifenesin, Oxycodone, Prednisone, Statins, Sulfonamide derivatives, Tetracycline, Vicodin [hydrocodone-acetaminophen], Famotidine, Latex, Omeprazole, Peanut-containing drug products, Protonix [pantoprazole], Janice Wellbutrin [bupropion]  Review  of Systems   Review of Systems  Unable to perform ROS: Acuity of condition  Constitutional: Negative for fever.  HENT: Negative for ear pain.   Respiratory: Positive for shortness of breath. Negative for cough.   Cardiovascular: Negative for palpitations Janice PND.  Gastrointestinal: Negative for abdominal pain Janice vomiting.    Physical Exam Updated Vital Signs BP (!) 148/71   Pulse 70   Temp 98.1 F (36.7 C) (Oral)   Resp 17   Ht 5' 4.5" (1.638 m)   Wt 64 kg   SpO2 92%   BMI 23.83 kg/m   Physical Exam Vitals Janice nursing note reviewed.  Constitutional:      Appearance: Normal appearance. [name] is not diaphoretic.  HENT:     Head: Normocephalic Janice atraumatic.  Eyes:     Conjunctiva/sclera: Conjunctivae normal.  Cardiovascular:     Rate Janice Rhythm: Normal rate Janice regular rhythm.     Pulses: Normal pulses.     Heart sounds: Normal heart sounds.  Pulmonary:     Breath sounds: Rales present.  Abdominal:     General: Abdomen is flat. Bowel sounds are normal.     Palpations: Abdomen is soft.     Tenderness: There is no abdominal tenderness. There is no guarding.  Musculoskeletal:     Right lower leg: Edema present.     Left lower leg: Edema present.  Skin:    General: Skin is warm Janice dry.     Capillary Refill: Capillary refill takes less than 2 seconds.  Neurological:     General: No focal deficit present.     Mental Status: [name], [name], [name].     Deep Tendon Reflexes: Reflexes normal.  Psychiatric:        Mood Janice Affect: Mood normal.        Behavior: Behavior normal.     ED Results / Procedures / Treatments   Labs (all labs ordered are listed, but only abnormal results are displayed) Results for orders placed or performed during the hospital encounter of 04/14/20  CBC with Differential/Platelet  Result Value Ref Range   WBC 6.5 4.0 - 10.5 K/uL   RBC 3.79 (L) 3.87 - 5.11 MIL/uL   Hemoglobin 10.5 (  L) 12.0 - 15.0 g/dL   HCT  34.3 (L) 36 - 46 %   MCV 90.5 80.0 - 100.0 fL   MCH 27.7 26.0 - 34.0 pg   MCHC 30.6 30.0 - 36.0 g/dL   RDW 14.1 11.5 - 15.5 %   Platelets 196 150 - 400 K/uL   nRBC 0.0 0.0 - 0.2 %   Neutrophils Relative % 62 %   Neutro Abs 4.1 1.7 - 7.7 K/uL   Lymphocytes Relative 23 %   Lymphs Abs 1.5 0.7 - 4.0 K/uL   Monocytes Relative 6 %   Monocytes Absolute 0.4 0.1 - 1.0 K/uL   Eosinophils Relative 8 %   Eosinophils Absolute 0.5 0.0 - 0.5 K/uL   Basophils Relative 1 %   Basophils Absolute 0.0 0.0 - 0.1 K/uL   Immature Granulocytes 0 %   Abs Immature Granulocytes 0.02 0.00 - 0.07 K/uL  I-stat chem 8, ED (not at [facility] or [facility])  Result Value Ref Range   Sodium 140 135 - 145 mmol/L   Potassium 4.2 3.5 - 5.1 mmol/L   Chloride 101 98 - 111 mmol/L   BUN 20 8 - 23 mg/dL   Creatinine, Ser 3.30 (H) 0.44 - 1.00 mg/dL   Glucose, Bld 112 (H) 70 - 99 mg/dL   Calcium, Ion 1.27 1.15 - 1.40 mmol/L   TCO2 28 22 - 32 mmol/L   Hemoglobin 10.9 (L) 12.0 - 15.0 g/dL   HCT 32.0 (L) 36 - 46 %   DG Chest Portable 1 View  Result Date: 04/14/2020 CLINICAL DATA:  Patient arrives [facility] for shortness of breath, hx of CHF Janice MI, reports [name] was feeling more short of breath last night, started lasix this week, EMS found her 85% on room air, 100% on NRB, atrially paced. EXAM: PORTABLE CHEST 1 VIEW COMPARISON:  03/06/2020 FINDINGS: Cardiac silhouette mildly enlarged. Stable left anterior chest wall biventricular cardioverter-defibrillator. No mediastinal or hilar masses. Mild interstitial thickening most evident in the lung bases. There is additional hazy opacity at both lung bases, greater on the right with the hemidiaphragm is partly obscured. No pneumothorax. Skeletal structures are grossly intact. IMPRESSION: 1. Mild interstitial thickening with additional hazy lung base opacity, the latter greater on the right. Suspect mild congestive heart failure with presumed small pleural effusions Janice basilar atelectasis.  Consider lung base pneumonia, particularly on the right, if there are consistent clinical findings. Electronically Signed   By: Lajean Manes M.D.   On: 04/14/2020 05:37    EKG  EKG Interpretation  Date/Jackson:  Saturday April 14 2020 05:01:33 EDT Ventricular Rate:  70 PR Interval:    QRS Duration: 137 QT Interval:  491 QTC Calculation: 530 R Axis:   -72 Text Interpretation: Atrial-paced rhythm Nonspecific IVCD with LAD Inferior infarct, old Confirmed by Randal Buba, Chelsee Hosie ([id number]) on 04/14/2020 6:37:29 AM       Radiology DG Chest Portable 1 View  Result Date: 04/14/2020 CLINICAL DATA:  Patient arrives [facility] for shortness of breath, hx of CHF Janice MI, reports [name] was feeling more short of breath last night, started lasix this week, EMS found her 85% on room air, 100% on NRB, atrially paced. EXAM: PORTABLE CHEST 1 VIEW COMPARISON:  03/06/2020 FINDINGS: Cardiac silhouette mildly enlarged. Stable left anterior chest wall biventricular cardioverter-defibrillator. No mediastinal or hilar masses. Mild interstitial thickening most evident in the lung bases. There is additional hazy opacity at both lung bases, greater on the right with the hemidiaphragm is partly obscured.  No pneumothorax. Skeletal structures are grossly intact. IMPRESSION: 1. Mild interstitial thickening with additional hazy lung base opacity, the latter greater on the right. Suspect mild congestive heart failure with presumed small pleural effusions Janice basilar atelectasis. Consider lung base pneumonia, particularly on the right, if there are consistent clinical findings. Electronically Signed   By: Lajean Manes M.D.   On: 04/14/2020 05:37    Procedures Procedures (including critical care Jackson)  Medications Ordered in ED Medications  furosemide (LASIX) injection 40 mg (has no administration in Jackson range)  cefTRIAXone (ROCEPHIN) 1 g in sodium chloride 0.9 % 100 mL IVPB (has no administration in Jackson range)    ED Course   I have reviewed the triage vital signs Janice the nursing notes.  Pertinent labs & imaging results that were available during my care of the patient were reviewed by me Janice considered in my medical decision making (see chart for details).   Acute renal failure Janice pulmonary edema.  Will start lasix, admit to medicine.    ANALEISE MCCLEERY was evaluated in Emergency Department on 04/14/2020 for the symptoms described in the history of present illness. [name] was evaluated in the context of the global COVID-19 pandemic, which necessitated consideration that the patient might be at risk for infection with the SARS-CoV-2 virus that causes COVID-19. Institutional protocols Janice algorithms that pertain to the evaluation of patients at risk for COVID-19 are in a state of rapid change based on information released by regulatory bodies including the CDC Janice federal Janice state organizations. These policies Janice algorithms were followed during the patient's care in the ED.  Final Clinical Impression(s) / ED Diagnoses Final diagnoses:  Acute pulmonary edema ([place])    Admit to medicine    Dot Splinter, MD 04/14/20 [phone]

## 2020-04-14 NOTE — Progress Notes (Signed)
  Echocardiogram 2D Echocardiogram with contrast has been performed.  [name] F 04/14/2020, 11:36 AM

## 2020-04-14 NOTE — ED Notes (Addendum)
Pt ambulated to restroom w/steady gait. O2 saturation dropped to 85% on room air while ambulating. Pt is back on 2L O2 [place] w/an O2 sat of 94%. RN, Delsa Sale, notified.

## 2020-04-14 NOTE — ED Notes (Signed)
echo at bedside

## 2020-04-15 DIAGNOSIS — Z8674 Personal history of sudden cardiac arrest: Secondary | ICD-10-CM | POA: Diagnosis not present

## 2020-04-15 DIAGNOSIS — Z96651 Presence of right artificial knee joint: Secondary | ICD-10-CM | POA: Diagnosis present

## 2020-04-15 DIAGNOSIS — K219 Gastro-esophageal reflux disease without esophagitis: Secondary | ICD-10-CM | POA: Diagnosis present

## 2020-04-15 DIAGNOSIS — E538 Deficiency of other specified B group vitamins: Secondary | ICD-10-CM | POA: Diagnosis present

## 2020-04-15 DIAGNOSIS — I130 Hypertensive heart and chronic kidney disease with heart failure and stage 1 through stage 4 chronic kidney disease, or unspecified chronic kidney disease: Secondary | ICD-10-CM | POA: Diagnosis present

## 2020-04-15 DIAGNOSIS — J9601 Acute respiratory failure with hypoxia: Secondary | ICD-10-CM | POA: Diagnosis not present

## 2020-04-15 DIAGNOSIS — I5023 Acute on chronic systolic (congestive) heart failure: Secondary | ICD-10-CM | POA: Diagnosis not present

## 2020-04-15 DIAGNOSIS — E559 Vitamin D deficiency, unspecified: Secondary | ICD-10-CM | POA: Diagnosis present

## 2020-04-15 DIAGNOSIS — J810 Acute pulmonary edema: Secondary | ICD-10-CM | POA: Diagnosis present

## 2020-04-15 DIAGNOSIS — Z66 Do not resuscitate: Secondary | ICD-10-CM | POA: Diagnosis present

## 2020-04-15 DIAGNOSIS — N179 Acute kidney failure, unspecified: Secondary | ICD-10-CM | POA: Diagnosis present

## 2020-04-15 DIAGNOSIS — I2510 Atherosclerotic heart disease of native coronary artery without angina pectoris: Secondary | ICD-10-CM | POA: Diagnosis present

## 2020-04-15 DIAGNOSIS — F419 Anxiety disorder, unspecified: Secondary | ICD-10-CM | POA: Diagnosis present

## 2020-04-15 DIAGNOSIS — N184 Chronic kidney disease, stage 4 (severe): Secondary | ICD-10-CM | POA: Diagnosis present

## 2020-04-15 DIAGNOSIS — I480 Paroxysmal atrial fibrillation: Secondary | ICD-10-CM | POA: Diagnosis present

## 2020-04-15 DIAGNOSIS — E876 Hypokalemia: Secondary | ICD-10-CM | POA: Diagnosis not present

## 2020-04-15 DIAGNOSIS — E785 Hyperlipidemia, unspecified: Secondary | ICD-10-CM | POA: Diagnosis present

## 2020-04-15 DIAGNOSIS — Z7902 Long term (current) use of antithrombotics/antiplatelets: Secondary | ICD-10-CM | POA: Diagnosis not present

## 2020-04-15 DIAGNOSIS — I248 Other forms of acute ischemic heart disease: Secondary | ICD-10-CM | POA: Diagnosis present

## 2020-04-15 DIAGNOSIS — Z79899 Other long term (current) drug therapy: Secondary | ICD-10-CM | POA: Diagnosis not present

## 2020-04-15 DIAGNOSIS — I420 Dilated cardiomyopathy: Secondary | ICD-10-CM | POA: Diagnosis present

## 2020-04-15 DIAGNOSIS — I739 Peripheral vascular disease, unspecified: Secondary | ICD-10-CM | POA: Diagnosis present

## 2020-04-15 DIAGNOSIS — Z7982 Long term (current) use of aspirin: Secondary | ICD-10-CM | POA: Diagnosis not present

## 2020-04-15 DIAGNOSIS — Z20822 Contact with and (suspected) exposure to covid-19: Secondary | ICD-10-CM | POA: Diagnosis present

## 2020-04-15 DIAGNOSIS — F32A Depression, unspecified: Secondary | ICD-10-CM | POA: Diagnosis present

## 2020-04-15 LAB — BASIC METABOLIC PANEL
Anion gap: 11 (ref 5–15)
BUN: 21 mg/dL (ref 8–23)
CO2: 29 mmol/L (ref 22–32)
Calcium: 9.1 mg/dL (ref 8.9–10.3)
Chloride: 101 mmol/L (ref 98–111)
Creatinine, Ser: 3.16 mg/dL — ABNORMAL HIGH (ref 0.44–1.00)
GFR, Estimated: 15 mL/min — ABNORMAL LOW (ref >60)
Glucose, Bld: 98 mg/dL (ref 70–99)
Potassium: 3.7 mmol/L (ref 3.5–5.1)
Sodium: 141 mmol/L (ref 135–145)

## 2020-04-15 MED ORDER — CARVEDILOL 12.5 MG PO TABS
12.5000 mg | ORAL_TABLET | Freq: Two times a day (BID) | ORAL | Status: DC
  Administered 2020-04-15 – 2020-04-16 (×2): 12.5 mg via ORAL
  Filled 2020-04-15 (×2): qty 1

## 2020-04-15 MED ORDER — FUROSEMIDE 10 MG/ML IJ SOLN
40.0000 mg | Freq: Two times a day (BID) | INTRAMUSCULAR | Status: AC
  Administered 2020-04-15 (×2): 40 mg via INTRAVENOUS
  Filled 2020-04-15 (×2): qty 4

## 2020-04-15 NOTE — Progress Notes (Signed)
PROGRESS NOTE    [name]  ZOX:[id number] DOB: Feb 15, 1951 DOA: 04/14/2020 PCP: Elby Beck, FNP   Brief Narrative:  [name] is a 69 y.o. female with medical history significant for hypertension, hyperlipidemia, former tobacco user, Arnold-Chiari malformation, VF/VT, cardiac arrest, torsades, nonischemic cardiomyopathy, peripheral vascular disease, paroxysmal atrial fibrillation on Eliquis, left bundle branch block, CVA presented to ED with shortness of breath for last 4 to 5 days which gets worse with laying flat and with exertion. she has required 2 pillows stacked in order to go to the sleep at night and sitting upright makes her feel better.  She endorses that she has been taking at least 3-4 bottles of water per day in an effort to help with her renal function. she recently was started on torsemide about 4 days ago and that she has been compliant.   Upon arrival to ED, her vitals showed elevated respiration rate of 21, heart rate 69, blood pressure 178/93, satting at 98% on 2 L nasal cannula.  Per ED provider when EMS found her she was satting at 88% on room air.  Patient does not require home O2 at baseline.  chest x-ray read as mild interstitial thickening with additional hazy lung base opacity, greater on the right.  Suspect mild congestive heart failure with presumed small pleural effusion and basilar atelectasis.  Consider lung base pneumonia, particularly on the right. BNP elevated at 2779; troponin high-sensitivity is 29. In the ED, ceftriaxone IV and Lasix 40 mg IV were ordered for pneumonia and congestive heart failure respectively.  She was subsequently admitted under hospitalist service.   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 She received 1 dose of IV Lasix 40 in the ED and then 60 mg after admission yesterday.  She tells me that she has had significant diuresis however chart documents only 40 mL of output.  Doubt accuracy of I's and O's.  Has some crackles at the bases.  We will give her 2 more doses of 40 mg IV Lasix today.  Reassess tomorrow.  She takes 12.5 mg of carvedilol twice daily at home but here she is on 6.125 for some reason unknown to me.  I will increase back to 12.5 mg.  Elevated troponin: 29> 36> 38> 40.  Slight elevated but flat.  Likely demand ischemia.  Radiographic concerns for pneumonia-no clinical findings of pneumonia.  Afebrile.  No leukocytosis.  No indication of antibiotics.  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  Extensive chart review indicates that she has been having slowly declining kidney function.  She sees Dr. Moshe Cipro as outpatient.  She has next appointment in next 1 to 2 weeks.  Her creatinine improved from 3.31 yesterday to 3.16 today.  No indication of nephrology consultation while here.  Paroxysmal atrial fibrillation-continue Eliquis 5 mg twice  daily, carvedilol  Hypertension-fluctuating but fairly controlled.  Will increase her carvedilol to 12.5 mg, her home dose.  Continue as needed hydralazine.  Hyperlipidemia-continue rosuvastatin 10 mg and ezetimibe 10 mg daily.  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   Code Status: DNR  Family Communication: None present at bedside.  Plan of care discussed with patient in length and he verbalized understanding and agreed with it.  Status is: Inpatient  Remains inpatient appropriate because:Inpatient level of care appropriate due to severity of illness   Dispo: The patient is from: Home              Anticipated d/c is to: Home              Anticipated d/c date is: 1 day              Patient currently is not medically stable to d/c.        Estimated body mass index is 23.17 kg/m as calculated from the following:   Height as of this encounter: 5' 4.5" (1.638 m).   Weight as of this encounter: 62.2 kg.      Nutritional status:               Consultants:   None  Procedures:   None  Antimicrobials:  Anti-infectives (From admission, onward)   Start     Dose/Rate Route Frequency Ordered Stop   04/14/20 0630  cefTRIAXone (ROCEPHIN) 1 g in sodium chloride 0.9 % 100 mL IVPB        1 g 200 mL/hr over 30 Minutes Intravenous  Once 04/14/20 0938 04/14/20 0920         Subjective: Seen and examined.  Her breathing feels better.  No new complaint.  She is concerned about her declining kidney function and heart function.  She is very practical and reasonable.  She has full information about how poorly her heart is functioning and that eventually she may end up having dialysis which she does not want.  She has been having ongoing discussions with her nephrologist as outpatient.  Objective: Vitals:   04/14/20 1945 04/15/20 0025 04/15/20 0448 04/15/20 0903  BP: 131/62 129/80 (!) 144/71 (!) 154/88   Pulse: 69 70 70 72  Resp: 18 18 20 18   Temp: 99.2 F (37.3 C) 98.3 F (36.8 C) 98.8 F (37.1 C) 97.9 F (36.6 C)  TempSrc: Oral Oral Oral Oral  SpO2: 92% 95% 98% 95%  Weight:   62.2 kg   Height:        Intake/Output Summary (Last 24 hours) at 04/15/2020 1036 Last data filed at 04/15/2020 0908 Gross per 24 hour  Intake 1330 ml  Output 480 ml  Net 850 ml   Filed Weights   04/14/20 0515 04/15/20 0448  Weight: 64 kg 62.2 kg    Examination:  General exam: Appears calm and comfortable  Respiratory system: Crackles bilaterally. Respiratory effort normal. Cardiovascular system: S1 & S2 heard, RRR. No JVD, murmurs, rubs, gallops or clicks. No pedal edema. Gastrointestinal system: Abdomen is nondistended, soft and nontender. No organomegaly or masses felt. Normal bowel sounds heard.  Central nervous system: Alert and oriented. No focal neurological deficits. Extremities: Symmetric 5 x 5 power. Skin: No rashes, lesions or ulcers Psychiatry: Judgement and insight appear normal. Mood & affect appropriate.    Data Reviewed: I have personally reviewed following labs and imaging studies  CBC: Recent Labs  Lab 04/14/20 0529 04/14/20 0540  WBC 6.5  --   NEUTROABS 4.1  --   HGB 10.5* 10.9*  HCT 34.3* 32.0*  MCV 90.5  --   PLT 196  --    Basic Metabolic Panel: Recent Labs  Lab 04/14/20 0529 04/14/20 0540 04/15/20 0328  NA 139 140 141  K 4.1 4.2 3.7  CL 102 101 101  CO2 25  --  29  GLUCOSE 119* 112* 98  BUN 19 20 21   CREATININE 3.31* 3.30* 3.16*  CALCIUM 9.2  --  9.1   GFR: Estimated Creatinine Clearance: 14.8 mL/min (A) (by C-G formula based on SCr of 3.16 mg/dL (H)). Liver Function Tests: Recent Labs  Lab 04/14/20 0529  AST 19  ALT 9  ALKPHOS 55  BILITOT 0.5  PROT 6.6  ALBUMIN 3.9   No results for input(s): LIPASE, AMYLASE in the last 168 hours. No results for input(s): AMMONIA in the last 168 hours. Coagulation Profile: No results for input(s): INR,  PROTIME in the last 168 hours. Cardiac Enzymes: No results for input(s): CKTOTAL, CKMB, CKMBINDEX, TROPONINI in the last 168 hours. BNP (last 3 results) Recent Labs    10/20/19 1208 11/10/19 1507 11/17/19 1031  PROBNP 3,188* 18,452* 13,138*   HbA1C: No results for input(s): HGBA1C in the last 72 hours. CBG: No results for input(s): GLUCAP in the last 168 hours. Lipid Profile: No results for input(s): CHOL, HDL, LDLCALC, TRIG, CHOLHDL, LDLDIRECT in the last 72 hours. Thyroid Function Tests: No results for input(s): TSH, T4TOTAL, FREET4, T3FREE, THYROIDAB in the last 72 hours. Anemia Panel: No results for input(s): VITAMINB12, FOLATE, FERRITIN, TIBC, IRON, RETICCTPCT in the last 72 hours. Sepsis Labs: No results for input(s): PROCALCITON, LATICACIDVEN in the last 168 hours.  Recent Results (from the past 240 hour(s))  Respiratory Panel by RT PCR (Flu A&B, Covid) - Nasopharyngeal Swab     Status: None   Collection Time: 04/14/20  5:24 AM   Specimen: Nasopharyngeal Swab  Result Value Ref Range Status   SARS Coronavirus 2 by RT PCR NEGATIVE NEGATIVE Final    Comment: (NOTE) SARS-CoV-2 target nucleic acids are NOT DETECTED.  The SARS-CoV-2 RNA is generally detectable in upper respiratoy specimens during the acute phase of infection. The lowest concentration of SARS-CoV-2 viral copies this assay can detect is 131 copies/mL. A negative result does not preclude SARS-Cov-2 infection and should not be used as the sole basis for treatment or other patient management decisions. A negative result may occur with  improper specimen collection/handling, submission of specimen other than nasopharyngeal swab, presence of viral mutation(s) within the areas targeted by this assay, and inadequate number of viral copies (<131 copies/mL). A negative result must be combined with clinical observations, patient history, and epidemiological information. The expected result is Negative.  Fact Sheet  for Patients:  PinkCheek.be  Fact Sheet for Healthcare Providers:  GravelBags.it  This test is no t yet approved or cleared by the [place] FDA and  has been authorized for detection and/or diagnosis of SARS-CoV-2 by FDA under an Emergency Use Authorization (EUA). This EUA will remain  in effect (meaning this test can be used) for the duration of the COVID-19  declaration under Section 564(b)(1) of the Act, 21 U.S.C. section 360bbb-3(b)(1), unless the authorization is terminated or revoked sooner.     Influenza A by PCR NEGATIVE NEGATIVE Final   Influenza B by PCR NEGATIVE NEGATIVE Final    Comment: (NOTE) The Xpert Xpress SARS-CoV-2/FLU/RSV assay is intended as an aid in  the diagnosis of influenza from Nasopharyngeal swab specimens and  should not be used as a sole basis for treatment. Nasal washings and  aspirates are unacceptable for Xpert Xpress SARS-CoV-2/FLU/RSV  testing.  Fact Sheet for Patients: PinkCheek.be  Fact Sheet for Healthcare Providers: GravelBags.it  This test is not yet approved or cleared by the [place] FDA and  has been authorized for detection and/or diagnosis of SARS-CoV-2 by  FDA under an Emergency Use Authorization (EUA). This EUA will remain  in effect (meaning this test can be used) for the duration of the  Covid-19 declaration under Section 564(b)(1) of the Act, 21  U.S.C. section 360bbb-3(b)(1), unless the authorization is  terminated or revoked. Performed at [facility], [place] [address] [place], [place] [address]   Blood culture (routine x 2)     Status: None (Preliminary result)   Collection Time: 04/14/20  7:50 AM   Specimen: BLOOD RIGHT HAND  Result Value Ref Range Status   Specimen Description BLOOD RIGHT HAND  Final   Special Requests   Final    BOTTLES DRAWN AEROBIC AND ANAEROBIC Blood Culture adequate  volume   Culture   Final    NO GROWTH 1 DAY Performed at [facility], [place] [address] [place], [place] [address]    Report Status PENDING  Incomplete  Blood culture (routine x 2)     Status: None (Preliminary result)   Collection Time: 04/14/20  7:55 AM   Specimen: BLOOD RIGHT HAND  Result Value Ref Range Status   Specimen Description BLOOD RIGHT HAND  Final   Special Requests   Final    BOTTLES DRAWN AEROBIC AND ANAEROBIC Blood Culture results may not be optimal due to an inadequate volume of blood received in culture bottles   Culture   Final    NO GROWTH 1 DAY Performed at [facility], [place] [address] [place],  [address]    Report Status PENDING  Incomplete      Radiology Studies: DG Chest Portable 1 View  Result Date: 04/14/2020 CLINICAL DATA:  Patient arrives [facility] for shortness of breath, hx of CHF and MI, reports she was feeling more short of breath last night, started lasix this week, EMS found her 85% on room air, 100% on NRB, atrially paced. EXAM: PORTABLE CHEST 1 VIEW COMPARISON:  03/06/2020 FINDINGS: Cardiac silhouette mildly enlarged. Stable left anterior chest wall biventricular cardioverter-defibrillator. No mediastinal or hilar masses. Mild interstitial thickening most evident in the lung bases. There is additional hazy opacity at both lung bases, greater on the right with the hemidiaphragm is partly obscured. No pneumothorax. Skeletal structures are grossly intact. IMPRESSION: 1. Mild interstitial thickening with additional hazy lung base opacity, the latter greater on the right. Suspect mild congestive heart failure with presumed small pleural effusions and basilar atelectasis. Consider lung base pneumonia, particularly on the right, if there are consistent clinical findings. Electronically Signed   By: Lajean Manes M.D.   On: 04/14/2020 05:37   ECHOCARDIOGRAM COMPLETE  Result Date: 04/14/2020    ECHOCARDIOGRAM REPORT   Patient Name:   [name] Date of Exam: 04/14/2020  Medical Rec #:  [id number]      Height:       64.5 in Accession #:    [id number]     Weight:       141.0 lb Date of Birth:  [birth year]      BSA:          1.695 m Patient Age:    53 years       BP:           133/98 mmHg Patient Gender: F              HR:           76 bpm. Exam Location:  Inpatient Procedure: 2D Echo, Cardiac Doppler, Color Doppler and Intracardiac            Opacification Agent Indications:    R06.02 SOB  History:        Patient has prior history of Echocardiogram examinations, most                 recent 08/04/2019.  Sonographer:    Merrie Roof RDCS Referring Phys: [id number] AMY N COX IMPRESSIONS  1. Left ventricular ejection fraction, by estimation, is 25 to 30%. The left ventricle has severely decreased function. The left ventricle demonstrates global hypokinesis. The left ventricular internal cavity size was moderately to severely dilated. Left ventricular diastolic function could not be evaluated.  2. Right ventricular systolic function is normal. The right ventricular size is normal. Tricuspid regurgitation signal is inadequate for assessing PA pressure.  3. Left atrial size was mildly dilated.  4. A small pericardial effusion is present. The pericardial effusion is anterior to the right ventricle and localized near the right atrium. Large pleural effusion in the left lateral region.  5. The mitral valve is degenerative. Mild to moderate mitral valve regurgitation. No evidence of mitral stenosis.  6. The aortic valve is tricuspid. Aortic valve regurgitation is not visualized. Mild aortic valve sclerosis is present, with no evidence of aortic valve stenosis.  7. The inferior vena cava is normal in size with <50% respiratory variability, suggesting right atrial pressure of 8 mmHg. FINDINGS  Left Ventricle: Left ventricular ejection fraction, by estimation, is 25 to 30%. The left ventricle has severely decreased function. The left ventricle demonstrates global  hypokinesis. Definity contrast agent was given IV to delineate the left ventricular endocardial borders. The left ventricular internal cavity size was moderately to severely dilated. There is no left ventricular hypertrophy. Left ventricular diastolic function could not be evaluated. Right Ventricle: The right ventricular size is normal. No increase in right ventricular wall thickness. Right ventricular systolic function is normal. Tricuspid regurgitation signal is inadequate for assessing PA pressure. Left Atrium: Left atrial size was mildly dilated. Right Atrium: Right atrial size was normal in size. Pericardium: A small pericardial effusion is present. The pericardial effusion is anterior to the right ventricle and localized near the right atrium. Mitral Valve: The mitral valve is degenerative in appearance. There is moderate thickening of the mitral valve leaflet(s). Mild mitral annular calcification. Mild to moderate mitral valve regurgitation. No evidence of mitral valve stenosis. Tricuspid Valve: The tricuspid valve is normal in structure. Tricuspid valve regurgitation is trivial. No evidence of tricuspid stenosis. Aortic Valve: The aortic valve is tricuspid. Aortic valve regurgitation is not visualized. Mild aortic valve sclerosis is present, with no evidence of aortic valve stenosis. Pulmonic Valve: The pulmonic valve was normal in structure. Pulmonic valve regurgitation is not visualized. No evidence of  pulmonic stenosis. Aorta: The aortic root is normal in size and structure. Venous: The inferior vena cava is normal in size with less than 50% respiratory variability, suggesting right atrial pressure of 8 mmHg. IAS/Shunts: No atrial level shunt detected by color flow Doppler. Additional Comments: A pacer wire is visualized. There is a large pleural effusion in the left lateral region.  LEFT VENTRICLE PLAX 2D LVIDd:         6.00 cm LVIDs:         5.30 cm LV PW:         1.00 cm LV IVS:        1.00 cm LVOT  diam:     1.90 cm LV SV:         43 LV SV Index:   26 LVOT Area:     2.84 cm  LV Volumes (MOD) LV vol d, MOD A2C: 155.5 ml LV vol d, MOD A4C: 191.0 ml LV vol s, MOD A2C: 75.5 ml LV vol s, MOD A4C: 158.0 ml LV SV MOD A2C:     80.0 ml LV SV MOD A4C:     191.0 ml LV SV MOD BP:      55.1 ml RIGHT VENTRICLE          IVC RV Basal diam:  3.10 cm  IVC diam: 1.80 cm LEFT ATRIUM              Index       RIGHT ATRIUM           Index LA diam:        4.00 cm  2.36 cm/m  RA Area:     15.50 cm LA Vol (A2C):   122.0 ml 71.96 ml/m RA Volume:   43.30 ml  25.54 ml/m LA Vol (A4C):   43.2 ml  25.48 ml/m LA Biplane Vol: 70.9 ml  41.82 ml/m  AORTIC VALVE LVOT Vmax:   90.10 cm/s LVOT Vmean:  48.100 cm/s LVOT VTI:    0.153 m  AORTA Ao Root diam: 3.30 cm Ao Asc diam:  2.80 cm MR Peak grad:    139.2 mmHg MR Mean grad:    80.0 mmHg   SHUNTS MR Vmax:         590.00 cm/s Systemic VTI:  0.15 m MR Vmean:        414.0 cm/s  Systemic Diam: 1.90 cm MR PISA:         1.01 cm MR PISA Eff ROA: 5 mm MR PISA Radius:  0.40 cm Fransico Him MD Electronically signed by Fransico Him MD Signature Date/Time: 04/14/2020/12:40:29 PM    Final     Scheduled Meds: . apixaban  5 mg Oral BID  . aspirin  81 mg Oral Daily  . carvedilol  6.25 mg Oral BID WC  . cholecalciferol  1,000 Units Oral Daily  . ezetimibe  10 mg Oral Daily  . famotidine  20 mg Oral Daily  . furosemide  40 mg Intravenous Q12H  . mouth rinse  15 mL Mouth Rinse BID  . rosuvastatin  10 mg Oral q1800  . sertraline  50 mg Oral QHS  . sodium chloride flush  3 mL Intravenous Q12H  . vitamin B-12  5,000 mcg Oral Daily   Continuous Infusions: . sodium chloride       LOS: 0 days   Time spent: 39 minutes   Darliss Cheney, MD [facility]  04/15/2020, 10:36 AM   To contact the attending provider  between 7A-7P or the covering provider during after hours 7P-7A, please log into the web site [email or web address].

## 2020-04-16 ENCOUNTER — Telehealth: Payer: Self-pay

## 2020-04-16 DIAGNOSIS — I5023 Acute on chronic systolic (congestive) heart failure: Secondary | ICD-10-CM | POA: Diagnosis not present

## 2020-04-16 DIAGNOSIS — N184 Chronic kidney disease, stage 4 (severe): Secondary | ICD-10-CM

## 2020-04-16 LAB — BASIC METABOLIC PANEL
Anion gap: 13 (ref 5–15)
BUN: 22 mg/dL (ref 8–23)
CO2: 27 mmol/L (ref 22–32)
Calcium: 9.2 mg/dL (ref 8.9–10.3)
Chloride: 99 mmol/L (ref 98–111)
Creatinine, Ser: 3.14 mg/dL — ABNORMAL HIGH (ref 0.44–1.00)
GFR, Estimated: 15 mL/min — ABNORMAL LOW (ref >60)
Glucose, Bld: 99 mg/dL (ref 70–99)
Potassium: 3.4 mmol/L — ABNORMAL LOW (ref 3.5–5.1)
Sodium: 139 mmol/L (ref 135–145)

## 2020-04-16 MED ORDER — POTASSIUM CHLORIDE CRYS ER 20 MEQ PO TBCR
40.0000 meq | EXTENDED_RELEASE_TABLET | Freq: Once | ORAL | Status: AC
  Administered 2020-04-16: 40 meq via ORAL
  Filled 2020-04-16: qty 2

## 2020-04-16 NOTE — Telephone Encounter (Signed)
Transition Care Management Follow-up Telephone Call  Date of discharge and from where: [facility]/16/2020[facility]   How have you been since you were released from the hospital? Spoke with daughter [name] (HIPAA verified) and patient is doing much better since her hospitalization.  Any questions or concerns? No  Items Reviewed:  Did the pt receive and understand the discharge instructions provided? Yes   Medications obtained and verified? Yes   Other? No   Any new allergies since your discharge? No   Dietary orders reviewed? Yes  Do you have support at home? Yes   Home Care and Equipment/Supplies: Were home health services ordered? not applicable If so, what is the name of the agency? N/A  Has the agency set up a time to come to the patient's home? not applicable Were any new equipment or medical supplies ordered?  No What is the name of the medical supply agency? N/A Were you able to get the supplies/equipment? not applicable Do you have any questions related to the use of the equipment or supplies? No  Functional Questionnaire: (I = Independent and D = Dependent) ADLs: I  Bathing/Dressing- I  Meal Prep- I  Eating- I  Maintaining continence- I  Transferring/Ambulation- I  Managing Meds- I  Follow up appointments reviewed:   PCP Hospital f/u appt confirmed? Yes  Scheduled to see Clarene Reamer, NP on 04/23/2020 @ 12 pm.  [facility] f/u appt confirmed? Yes  Scheduled to see cardiology   Are transportation arrangements needed? No  If their condition worsens, is the pt aware to call PCP or go to the Emergency Dept.? Yes  Was the patient provided with contact information for the PCP's office or ED? Yes  Was to pt encouraged to call back with questions or concerns? Yes

## 2020-04-16 NOTE — Discharge Summary (Signed)
Physician Discharge Summary  LITZI BINNING ZOX:[id number] DOB: Jan 28, 1951 DOA: 04/14/2020  PCP: Elby Beck, FNP  Admit date: 04/14/2020 Discharge date: 04/16/2020  Admitted From: Home Disposition: Home  Recommendations for Outpatient Follow-up:  1. Follow up with PCP in 1-2 weeks 2. Follow-up with cardiology in 1 to 2 weeks for possible adjustment in diuretics. 3. Please obtain BMP/CBC in one week 4. Please follow up with your PCP on the following pending results: Unresulted Labs (From admission, onward)          Start     Ordered   04/15/20 8119  Basic metabolic panel  Daily,   R      04/14/20 0807           Home Health: None Equipment/Devices: None  Discharge Condition: Stable CODE STATUS: DNR Diet recommendation: Low-sodium  Subjective: Seen and examined.  No complaints.  No shortness of breath.  Wants to go home.  Brief/Interim Summary: Lasheena Frieze Radfordis a 69 y.o.femalewith medical history significant forhypertension, hyperlipidemia, former tobacco user, Arnold-Chiari malformation, VF/VT, cardiac arrest, torsades, nonischemic cardiomyopathy, peripheral vascular disease, paroxysmal atrial fibrillation on Eliquis, left bundle branch block, CVA presented to ED with shortness of breath for last 4 to 5 days which gets worse with laying flat and with exertion. she has required 2 pillows stacked in order to go to the sleep at night and sitting upright makes her feel better. She endorses that she has been taking at least 3-4 bottles of water per day in an effort to help with her renal function. she recently was started on torsemide about 4 days ago and that she has been compliant.   Upon arrival to ED, her vitals showed elevated respiration rate of 21, heart rate 69, blood pressure 178/93, satting at 98% on 2 L nasal cannula. Per ED provider when EMS found her she was satting at 88% on room air. Patient does not require home O2 at baseline.  chest x-ray read as mild  interstitial thickening with additional hazy lung base opacity, greater on the right. Suspect mild congestive heart failure with presumed small pleural effusion and basilar atelectasis. Consider lung base pneumonia, particularly on the right. BNP elevated at 2779; troponin high-sensitivity is 29. In the ED, ceftriaxone IV and Lasix 40 mg IV were ordered for pneumonia and congestive heart failure respectively.  She was subsequently admitted under hospitalist service with multiple diagnoses including acute hypoxic respiratory failure secondary to acute on chronic systolic congestive heart failure, acute kidney injury on chronic kidney disease stage IV.  She was continued on IV diuresis.  She had good diuresis.  Over the course of last 48 hours, patient improved significantly.  She was weaned down to room air.  Ambulatory oximetry was checked and her oxygen saturation remained 93% on room air with ambulation and 95% at rest.  She is asymptomatic.  Her renal function improved and is back to her baseline of stage IV.  She has mild hypokalemia of 3.4 today.  She received replacement today.  She is going to be discharged back home in stable condition.  Due to her progressively worsening renal function, I do not feel comfortable increasing her diuretics and thus recommend that she follows up with her primary cardiologist and nephrologist for that.  Discharge Diagnoses:  Active Problems:   Heart failure (HCC)   Acute respiratory failure with hypoxia [facility])    Discharge Instructions   Allergies as of 04/16/2020      Reactions   Shellfish-derived Products  Anaphylaxis   Ativan [lorazepam] Other (See Comments)   Makes "skin crawl" and insomnia   Buspar [buspirone] Nausea Only   Sweating and dizzy   Clarithromycin Swelling   Codeine Nausea And Vomiting   Doxycycline Other (See Comments)   Unknown   Guaifenesin Other (See Comments)   Palpitations   Oxycodone Nausea And Vomiting   Prednisone Nausea And  Vomiting, Other (See Comments)   Makes my heart race   Statins Other (See Comments)   Muscle pain   Sulfonamide Derivatives Nausea And Vomiting   Achiness   Tetracycline Nausea Only   Vicodin [hydrocodone-acetaminophen] Nausea And Vomiting   Famotidine Rash   Latex Rash   Omeprazole Nausea Only   Cough, shortness of breath - patient doesn't remember   Peanut-containing Drug Products Itching, Rash   Protonix [pantoprazole] Rash   Wellbutrin [bupropion] Palpitations      Medication List    TAKE these medications   acetaminophen 500 MG tablet Commonly known as: TYLENOL Take 1,000 mg by mouth 2 (two) times daily as needed (pain).   apixaban 5 MG Tabs tablet Commonly known as: Eliquis Take 1 tablet (5 mg total) by mouth 2 (two) times daily.   aspirin 81 MG chewable tablet Chew 1 tablet (81 mg total) by mouth daily.   B12 FAST DISSOLVE PO Take 1 tablet by mouth daily.   carvedilol 12.5 MG tablet Commonly known as: COREG TAKE 1 TABLET (12.5 MG TOTAL) BY MOUTH 2 (TWO) TIMES DAILY WITH A MEAL. What changed: See the new instructions.   docusate sodium 100 MG capsule Commonly known as: COLACE Take 1 capsule (100 mg total) by mouth daily as needed for mild constipation.   ezetimibe 10 MG tablet Commonly known as: ZETIA TAKE 1 TABLET BY MOUTH EVERY DAY   loratadine 10 MG tablet Commonly known as: CLARITIN Take 10 mg by mouth daily as needed for allergies.   Magnesium 400 MG Tabs Take 400 mg by mouth 2 (two) times daily.   nitroGLYCERIN 0.4 MG SL tablet Commonly known as: NITROSTAT Place 1 tablet (0.4 mg total) under the tongue every 5 (five) minutes as needed for chest pain.   ondansetron 4 MG disintegrating tablet Commonly known as: Zofran ODT Take 1 tablet (4 mg total) by mouth every 8 (eight) hours as needed for nausea or vomiting.   PEPCID PO Take 20 mg by mouth in the morning and at bedtime.   rosuvastatin 10 MG tablet Commonly known as: CRESTOR TAKE 1  TABLET (10 MG TOTAL) BY MOUTH DAILY AT 6 PM.   sertraline 50 MG tablet Commonly known as: ZOLOFT Take 1 tablet (50 mg total) by mouth at bedtime. To start medication- take 1/2 tablet at bedtime for 7 days then increase to 1 tablet at bedtime. What changed: additional instructions   torsemide 5 MG tablet Commonly known as: DEMADEX Take 1 tablet (5 mg total) by mouth daily.   VITAMIN D3 PO Take 1,000 Units by mouth in the morning and at bedtime.       Follow-up Information    Elby Beck, FNP Follow up in 1 week(s).   Specialties: Nurse Practitioner, Family Medicine Contact information: [address] [place] [address] [phone]        Rex Kras, DO Follow up in 1 week(s).   Specialties: Cardiology, Radiology, Vascular Surgery Contact information: [place] [address] [phone]  Allergies  Allergen Reactions  . Shellfish-Derived Products Anaphylaxis  . Ativan [Lorazepam] Other (See Comments)    Makes "skin crawl" and insomnia  . Buspar [Buspirone] Nausea Only    Sweating and dizzy  . Clarithromycin Swelling  . Codeine Nausea And Vomiting  . Doxycycline Other (See Comments)    Unknown  . Guaifenesin Other (See Comments)    Palpitations  . Oxycodone Nausea And Vomiting  . Prednisone Nausea And Vomiting and Other (See Comments)    Makes my heart race  . Statins Other (See Comments)    Muscle pain  . Sulfonamide Derivatives Nausea And Vomiting    Achiness  . Tetracycline Nausea Only  . Vicodin [Hydrocodone-Acetaminophen] Nausea And Vomiting  . Famotidine Rash  . Latex Rash  . Omeprazole Nausea Only    Cough, shortness of breath - patient doesn't remember  . Peanut-Containing Drug Products Itching and Rash  . Protonix [Pantoprazole] Rash  . Wellbutrin [Bupropion] Palpitations    Consultations: None   Procedures/Studies: DG Chest Portable 1 View  Result Date: 04/14/2020 CLINICAL DATA:   Patient arrives [facility] for shortness of breath, hx of CHF and MI, reports she was feeling more short of breath last night, started lasix this week, EMS found her 85% on room air, 100% on NRB, atrially paced. EXAM: PORTABLE CHEST 1 VIEW COMPARISON:  03/06/2020 FINDINGS: Cardiac silhouette mildly enlarged. Stable left anterior chest wall biventricular cardioverter-defibrillator. No mediastinal or hilar masses. Mild interstitial thickening most evident in the lung bases. There is additional hazy opacity at both lung bases, greater on the right with the hemidiaphragm is partly obscured. No pneumothorax. Skeletal structures are grossly intact. IMPRESSION: 1. Mild interstitial thickening with additional hazy lung base opacity, the latter greater on the right. Suspect mild congestive heart failure with presumed small pleural effusions and basilar atelectasis. Consider lung base pneumonia, particularly on the right, if there are consistent clinical findings. Electronically Signed   By: Lajean Manes M.D.   On: 04/14/2020 05:37   ECHOCARDIOGRAM COMPLETE  Result Date: 04/14/2020    ECHOCARDIOGRAM REPORT   Patient Name:   ISHIA TENORIO Roessner Date of Exam: 04/14/2020 Medical Rec #:  [id number]      Height:       64.5 in Accession #:    [id number]     Weight:       141.0 lb Date of Birth:  [birth year]      BSA:          1.695 m Patient Age:    7 years       BP:           133/98 mmHg Patient Gender: F              HR:           76 bpm. Exam Location:  Inpatient Procedure: 2D Echo, Cardiac Doppler, Color Doppler and Intracardiac            Opacification Agent Indications:    R06.02 SOB  History:        Patient has prior history of Echocardiogram examinations, most                 recent 08/04/2019.  Sonographer:    Merrie Roof RDCS Referring Phys: [id number] AMY N COX IMPRESSIONS  1. Left ventricular ejection fraction, by estimation, is 25 to 30%. The left ventricle has severely decreased function. The left ventricle  demonstrates global hypokinesis. The left ventricular internal cavity size was moderately  to severely dilated. Left ventricular diastolic function could not be evaluated.  2. Right ventricular systolic function is normal. The right ventricular size is normal. Tricuspid regurgitation signal is inadequate for assessing PA pressure.  3. Left atrial size was mildly dilated.  4. A small pericardial effusion is present. The pericardial effusion is anterior to the right ventricle and localized near the right atrium. Large pleural effusion in the left lateral region.  5. The mitral valve is degenerative. Mild to moderate mitral valve regurgitation. No evidence of mitral stenosis.  6. The aortic valve is tricuspid. Aortic valve regurgitation is not visualized. Mild aortic valve sclerosis is present, with no evidence of aortic valve stenosis.  7. The inferior vena cava is normal in size with <50% respiratory variability, suggesting right atrial pressure of 8 mmHg. FINDINGS  Left Ventricle: Left ventricular ejection fraction, by estimation, is 25 to 30%. The left ventricle has severely decreased function. The left ventricle demonstrates global hypokinesis. Definity contrast agent was given IV to delineate the left ventricular endocardial borders. The left ventricular internal cavity size was moderately to severely dilated. There is no left ventricular hypertrophy. Left ventricular diastolic function could not be evaluated. Right Ventricle: The right ventricular size is normal. No increase in right ventricular wall thickness. Right ventricular systolic function is normal. Tricuspid regurgitation signal is inadequate for assessing PA pressure. Left Atrium: Left atrial size was mildly dilated. Right Atrium: Right atrial size was normal in size. Pericardium: A small pericardial effusion is present. The pericardial effusion is anterior to the right ventricle and localized near the right atrium. Mitral Valve: The mitral valve is  degenerative in appearance. There is moderate thickening of the mitral valve leaflet(s). Mild mitral annular calcification. Mild to moderate mitral valve regurgitation. No evidence of mitral valve stenosis. Tricuspid Valve: The tricuspid valve is normal in structure. Tricuspid valve regurgitation is trivial. No evidence of tricuspid stenosis. Aortic Valve: The aortic valve is tricuspid. Aortic valve regurgitation is not visualized. Mild aortic valve sclerosis is present, with no evidence of aortic valve stenosis. Pulmonic Valve: The pulmonic valve was normal in structure. Pulmonic valve regurgitation is not visualized. No evidence of pulmonic stenosis. Aorta: The aortic root is normal in size and structure. Venous: The inferior vena cava is normal in size with less than 50% respiratory variability, suggesting right atrial pressure of 8 mmHg. IAS/Shunts: No atrial level shunt detected by color flow Doppler. Additional Comments: A pacer wire is visualized. There is a large pleural effusion in the left lateral region.  LEFT VENTRICLE PLAX 2D LVIDd:         6.00 cm LVIDs:         5.30 cm LV PW:         1.00 cm LV IVS:        1.00 cm LVOT diam:     1.90 cm LV SV:         43 LV SV Index:   26 LVOT Area:     2.84 cm  LV Volumes (MOD) LV vol d, MOD A2C: 155.5 ml LV vol d, MOD A4C: 191.0 ml LV vol s, MOD A2C: 75.5 ml LV vol s, MOD A4C: 158.0 ml LV SV MOD A2C:     80.0 ml LV SV MOD A4C:     191.0 ml LV SV MOD BP:      55.1 ml RIGHT VENTRICLE          IVC RV Basal diam:  3.10 cm  IVC diam: 1.80  cm LEFT ATRIUM              Index       RIGHT ATRIUM           Index LA diam:        4.00 cm  2.36 cm/m  RA Area:     15.50 cm LA Vol (A2C):   122.0 ml 71.96 ml/m RA Volume:   43.30 ml  25.54 ml/m LA Vol (A4C):   43.2 ml  25.48 ml/m LA Biplane Vol: 70.9 ml  41.82 ml/m  AORTIC VALVE LVOT Vmax:   90.10 cm/s LVOT Vmean:  48.100 cm/s LVOT VTI:    0.153 m  AORTA Ao Root diam: 3.30 cm Ao Asc diam:  2.80 cm MR Peak grad:    139.2 mmHg  MR Mean grad:    80.0 mmHg   SHUNTS MR Vmax:         590.00 cm/s Systemic VTI:  0.15 m MR Vmean:        414.0 cm/s  Systemic Diam: 1.90 cm MR PISA:         1.01 cm MR PISA Eff ROA: 5 mm MR PISA Radius:  0.40 cm Fransico Him MD Electronically signed by Fransico Him MD Signature Date/Time: 04/14/2020/12:40:29 PM    Final       Discharge Exam: Vitals:   04/16/20 0757 04/16/20 0800  BP: 137/81 137/81  Pulse: 70   Resp: 19 15  Temp: 98.3 F (36.8 C)   SpO2: 98%    Vitals:   04/16/20 0455 04/16/20 0503 04/16/20 0757 04/16/20 0800  BP: 139/73  137/81 137/81  Pulse: 70  70   Resp: 18 17 19 15   Temp: 98.5 F (36.9 C)  98.3 F (36.8 C)   TempSrc: Oral  Oral   SpO2: 97%  98%   Weight:  61.1 kg    Height:        General: Pt is alert, awake, not in acute distress Cardiovascular: RRR, S1/S2 +, no rubs, no gallops Respiratory: CTA bilaterally, no wheezing, no rhonchi Abdominal: Soft, NT, ND, bowel sounds + Extremities: no edema, no cyanosis    The results of significant diagnostics from this hospitalization (including imaging, microbiology, ancillary and laboratory) are listed below for reference.     Microbiology: Recent Results (from the past 240 hour(s))  Respiratory Panel by RT PCR (Flu A&B, Covid) - Nasopharyngeal Swab     Status: None   Collection Time: 04/14/20  5:24 AM   Specimen: Nasopharyngeal Swab  Result Value Ref Range Status   SARS Coronavirus 2 by RT PCR NEGATIVE NEGATIVE Final    Comment: (NOTE) SARS-CoV-2 target nucleic acids are NOT DETECTED.  The SARS-CoV-2 RNA is generally detectable in upper respiratoy specimens during the acute phase of infection. The lowest concentration of SARS-CoV-2 viral copies this assay can detect is 131 copies/mL. A negative result does not preclude SARS-Cov-2 infection and should not be used as the sole basis for treatment or other patient management decisions. A negative result may occur with  improper specimen  collection/handling, submission of specimen other than nasopharyngeal swab, presence of viral mutation(s) within the areas targeted by this assay, and inadequate number of viral copies (<131 copies/mL). A negative result must be combined with clinical observations, patient history, and epidemiological information. The expected result is Negative.  Fact Sheet for Patients:  PinkCheek.be  Fact Sheet for Healthcare Providers:  GravelBags.it  This test is no t yet approved or cleared by the  [place] Architectural technologist and  has been authorized for detection and/or diagnosis of SARS-CoV-2 by FDA under an Print production planner (EUA). This EUA will remain  in effect (meaning this test can be used) for the duration of the COVID-19 declaration under Section 564(b)(1) of the Act, 21 U.S.C. section 360bbb-3(b)(1), unless the authorization is terminated or revoked sooner.     Influenza A by PCR NEGATIVE NEGATIVE Final   Influenza B by PCR NEGATIVE NEGATIVE Final    Comment: (NOTE) The Xpert Xpress SARS-CoV-2/FLU/RSV assay is intended as an aid in  the diagnosis of influenza from Nasopharyngeal swab specimens and  should not be used as a sole basis for treatment. Nasal washings and  aspirates are unacceptable for Xpert Xpress SARS-CoV-2/FLU/RSV  testing.  Fact Sheet for Patients: PinkCheek.be  Fact Sheet for Healthcare Providers: GravelBags.it  This test is not yet approved or cleared by the [place] FDA and  has been authorized for detection and/or diagnosis of SARS-CoV-2 by  FDA under an Emergency Use Authorization (EUA). This EUA will remain  in effect (meaning this test can be used) for the duration of the  Covid-19 declaration under Section 564(b)(1) of the Act, 21  U.S.C. section 360bbb-3(b)(1), unless the authorization is  terminated or revoked. Performed at [facility], [place] [address], [place], [place] [address]   Blood culture (routine x 2)     Status: None (Preliminary result)   Collection Time: 04/14/20  7:50 AM   Specimen: BLOOD RIGHT HAND  Result Value Ref Range Status   Specimen Description BLOOD RIGHT HAND  Final   Special Requests   Final    BOTTLES DRAWN AEROBIC AND ANAEROBIC Blood Culture adequate volume   Culture   Final    NO GROWTH 2 DAYS Performed at [facility], [place] [address] [place], [place] [address]    Report Status PENDING  Incomplete  Blood culture (routine x 2)     Status: None (Preliminary result)   Collection Time: 04/14/20  7:55 AM   Specimen: BLOOD RIGHT HAND  Result Value Ref Range Status   Specimen Description BLOOD RIGHT HAND  Final   Special Requests   Final    BOTTLES DRAWN AEROBIC AND ANAEROBIC Blood Culture results may not be optimal due to an inadequate volume of blood received in culture bottles   Culture   Final    NO GROWTH 2 DAYS Performed at [facility], [place] [address], [place], [place] [address]    Report Status PENDING  Incomplete     Labs: BNP (last 3 results) Recent Labs    08/24/19 0512 04/14/20 0524  BNP 617.4* 0,865.7*   Basic Metabolic Panel: Recent Labs  Lab 04/14/20 0529 04/14/20 0540 04/15/20 0328 04/16/20 0148  NA 139 140 141 139  K 4.1 4.2 3.7 3.4*  CL 102 101 101 99  CO2 25  --  29 27  GLUCOSE 119* 112* 98 99  BUN 19 20 21 22   CREATININE 3.31* 3.30* 3.16* 3.14*  CALCIUM 9.2  --  9.1 9.2   Liver Function Tests: Recent Labs  Lab 04/14/20 0529  AST 19  ALT 9  ALKPHOS 55  BILITOT 0.5  PROT 6.6  ALBUMIN 3.9   No results for input(s): LIPASE, AMYLASE in the last 168 hours. No results for input(s): AMMONIA in the last 168 hours. CBC: Recent Labs  Lab 04/14/20 0529 04/14/20 0540  WBC 6.5  --   NEUTROABS 4.1  --  HGB 10.5* 10.9*  HCT 34.3* 32.0*  MCV 90.5  --   PLT 196  --    Cardiac Enzymes: No results for input(s): CKTOTAL, CKMB,  CKMBINDEX, TROPONINI in the last 168 hours. BNP: Invalid input(s): POCBNP CBG: No results for input(s): GLUCAP in the last 168 hours. D-Dimer No results for input(s): DDIMER in the last 72 hours. Hgb A1c No results for input(s): HGBA1C in the last 72 hours. Lipid Profile No results for input(s): CHOL, HDL, LDLCALC, TRIG, CHOLHDL, LDLDIRECT in the last 72 hours. Thyroid function studies No results for input(s): TSH, T4TOTAL, T3FREE, THYROIDAB in the last 72 hours.  Invalid input(s): FREET3 Anemia work up No results for input(s): VITAMINB12, FOLATE, FERRITIN, TIBC, IRON, RETICCTPCT in the last 72 hours. Urinalysis    Component Value Date/Time   COLORURINE STRAW (A) 08/14/2019 2345   APPEARANCEUR CLEAR 08/14/2019 2345   LABSPEC 1.004 (L) 08/14/2019 2345   PHURINE 6.0 08/14/2019 2345   GLUCOSEU NEGATIVE 08/14/2019 2345   HGBUR NEGATIVE 08/14/2019 2345   HGBUR negative 03/01/2007 1207   BILIRUBINUR negative 03/26/2020 1543   KETONESUR NEGATIVE 08/14/2019 2345   PROTEINUR Negative 03/26/2020 1543   PROTEINUR NEGATIVE 08/14/2019 2345   UROBILINOGEN 0.2 03/26/2020 1543   UROBILINOGEN 0.2 01/12/2011 1203   NITRITE negative 03/26/2020 1543   NITRITE NEGATIVE 08/14/2019 2345   LEUKOCYTESUR Negative 03/26/2020 1543   LEUKOCYTESUR NEGATIVE 08/14/2019 2345   Sepsis Labs Invalid input(s): PROCALCITONIN,  WBC,  LACTICIDVEN Microbiology Recent Results (from the past 240 hour(s))  Respiratory Panel by RT PCR (Flu A&B, Covid) - Nasopharyngeal Swab     Status: None   Collection Time: 04/14/20  5:24 AM   Specimen: Nasopharyngeal Swab  Result Value Ref Range Status   SARS Coronavirus 2 by RT PCR NEGATIVE NEGATIVE Final    Comment: (NOTE) SARS-CoV-2 target nucleic acids are NOT DETECTED.  The SARS-CoV-2 RNA is generally detectable in upper respiratoy specimens during the acute phase of infection. The lowest concentration of SARS-CoV-2 viral copies this assay can detect is 131 copies/mL.  A negative result does not preclude SARS-Cov-2 infection and should not be used as the sole basis for treatment or other patient management decisions. A negative result may occur with  improper specimen collection/handling, submission of specimen other than nasopharyngeal swab, presence of viral mutation(s) within the areas targeted by this assay, and inadequate number of viral copies (<131 copies/mL). A negative result must be combined with clinical observations, patient history, and epidemiological information. The expected result is Negative.  Fact Sheet for Patients:  PinkCheek.be  Fact Sheet for Healthcare Providers:  GravelBags.it  This test is no t yet approved or cleared by the [place] FDA and  has been authorized for detection and/or diagnosis of SARS-CoV-2 by FDA under an Emergency Use Authorization (EUA). This EUA will remain  in effect (meaning this test can be used) for the duration of the COVID-19 declaration under Section 564(b)(1) of the Act, 21 U.S.C. section 360bbb-3(b)(1), unless the authorization is terminated or revoked sooner.     Influenza A by PCR NEGATIVE NEGATIVE Final   Influenza B by PCR NEGATIVE NEGATIVE Final    Comment: (NOTE) The Xpert Xpress SARS-CoV-2/FLU/RSV assay is intended as an aid in  the diagnosis of influenza from Nasopharyngeal swab specimens and  should not be used as a sole basis for treatment. Nasal washings and  aspirates are unacceptable for Xpert Xpress SARS-CoV-2/FLU/RSV  testing.  Fact Sheet for Patients: PinkCheek.be  Fact Sheet for Healthcare Providers:  GravelBags.it  This test is not yet approved or cleared by the [place] and  has been authorized for detection and/or diagnosis of SARS-CoV-2 by  FDA under an Emergency Use Authorization (EUA). This EUA will remain  in effect (meaning this test  can be used) for the duration of the  Covid-19 declaration under Section 564(b)(1) of the Act, 21  U.S.C. section 360bbb-3(b)(1), unless the authorization is  terminated or revoked. Performed at [facility], [place] [address] [place], [place] [address]   Blood culture (routine x 2)     Status: None (Preliminary result)   Collection Time: 04/14/20  7:50 AM   Specimen: BLOOD RIGHT HAND  Result Value Ref Range Status   Specimen Description BLOOD RIGHT HAND  Final   Special Requests   Final    BOTTLES DRAWN AEROBIC AND ANAEROBIC Blood Culture adequate volume   Culture   Final    NO GROWTH 2 DAYS Performed at [facility], [place] [address] [place], [place] [address]    Report Status PENDING  Incomplete  Blood culture (routine x 2)     Status: None (Preliminary result)   Collection Time: 04/14/20  7:55 AM   Specimen: BLOOD RIGHT HAND  Result Value Ref Range Status   Specimen Description BLOOD RIGHT HAND  Final   Special Requests   Final    BOTTLES DRAWN AEROBIC AND ANAEROBIC Blood Culture results may not be optimal due to an inadequate volume of blood received in culture bottles   Culture   Final    NO GROWTH 2 DAYS Performed at [facility], [place] [address] [place], [place] [address]    Report Status PENDING  Incomplete     Time coordinating discharge: Over 30 minutes  SIGNED:   Darliss Cheney, MD  [facility] 04/16/2020, 10:49 AM  If 7PM-7AM, please contact night-coverage [email or web address]

## 2020-04-16 NOTE — Discharge Instructions (Signed)
Pulmonary Edema Pulmonary edema is a condition in which fluid collects in the air sacs of the lung. This makes it hard for the lungs to fill with air. It also prevents the lungs from moving oxygen into the bloodstream, which can affect other organs, such as the brain and kidneys. Pulmonary edema is an emergency and should be treated immediately. There are two main types of pulmonary edema:  Cardiogenic. This means the pulmonary edema was caused by a problem with the heart.  Noncardiogenic. This means the pulmonary edema was caused by something other than the heart, such as an injury to the lung. What are the causes? This condition is commonly caused by heart failure. When this happens, the heart is not able to properly pump blood through the body. This can lead to increased pressure in the heart and blood building up in the veins around the lungs. When blood builds up in these veins, fluid gets pushed into the air sacs of the lung. Heart failure may be caused by:  Coronary artery disease.  High blood pressure.  Viral infection of the heart (myocarditis).  Leaky or stiff heart valves.  Irregular heartbeat (arrhythmia).  Fluid buildup caused by kidney problems. Other causes include:  Infection in the lungs (pneumonia), blood (sepsis), or other part of the body.  Severe injury to the chest.  Lung injury from heat or toxins, such as breathing in smoke or poisonous gas.  Inhaling vomit or water (pulmonaryaspiration).  Certain medicines.  High altitude. What are the signs or symptoms? Symptoms of this condition include:  Shortness of breath.  Coughing with frothy or bloody mucus.  Wheezing.  Feeling like you cannot get enough air.  Shallow and fast breathing.  Skin that is cool and damp, and has a pale or bluish color. How is this diagnosed? This condition is diagnosed based on:  Your medical history.  A physical exam.  Your symptoms. You may also have other tests,  including:  Chest X-ray.  Chest CT scan.  Blood tests, including checking the amount of oxygen in the blood.  Sputum culture. This test checks for infection in the mucus that you cough up from your lungs.  Electrocardiogram. This measures the electrical signals of the heart.  Echocardiogram. This uses an ultrasound to evaluate the health of the heart. How is this treated? Initial treatment for this condition focuses on relieving your symptoms. Treatment depends on the underlying cause of the condition. This may include:  Oxygen therapy. The oxygen may be given through tubes in your nose or through a face mask. In severe cases, a breathing tube is inserted into the windpipe and hooked up to a breathing machine (ventilator).  Medicines. These may include medicines to: ? Help the body get rid of extra water (diuretics). ? Help the heart pump blood properly. ? Prevent or destroy blood clots. If poor heart function is the cause, treatment may also include:  Procedures to open blocked arteries, repair damaged heart valves, or remove some of the damaged heart muscle.  A pacemaker to help with heart function.  A procedure that uses electric shocks to regulate heart rate (cardioversion). If an infection is the cause, treatment may include antibiotic medicines. Follow these instructions at home: Medicines  Take over-the-counter and prescription medicines only as told by your health care provider.  If you were prescribed an antibiotic, take it as told by your health care provider. Do not stop taking the antibiotic even if you start to feel better.  Have a plan with information about each medicine you take. This should include: ? Why you take the medicine. ? Possible side effects. ? Best time of day to take it. ? Foods to take with it, or foods to avoid when taking it. ? When to call your health care provider.  Make a list of each medicine, vitamin, or herbal supplement you take. Keep  the list with you at all times. Show it to your health care provider at each visit and before starting a new medicine. Update the list as you add or stop medicines. Lifestyle   Exercise regularly as told by your health care provider. It is important to do it safely. You can do this by: ? Pacing your activities to avoid shortness of breath or chest pain. ? Resting for at least 1 hour before and after meals. ? Asking about cardiac rehabilitation programs. These may include education, exercise plans, and counseling.  Eat a heart-healthy diet that is low in salt (sodium), saturated fat, and cholesterol. Your health care provider may recommend foods that are high in fiber, such as fresh fruits and vegetables, whole grains, and beans.  Do not use any products that contain nicotine or tobacco, such as cigarettes and e-cigarettes. If you need help quitting, ask your health care provider. General instructions  Maintain a healthy weight.  Keep a record of your weight: ? Record your hospital or clinic weight. When you get home, compare it to your scale and record your weight. ? Weigh yourself first thing each morning after you urinate and before you eat breakfast. Wear the same amount of clothing each time. Record the weights. ? Share your weight record with your health care provider. Daily weights are important in detecting the body's retention of excess fluid. ? Tell your health care provider right away if you gain weight quickly. Your medicines may need to be adjusted.  Check and record your blood pressure as often as told by your health care provider. Bring the records with you to clinic visits.  Consider therapy or joining a support group. This may help with any stress, fear, or anxiety.  Keep all follow-up visits as told by your health care provider. This is important. Get help right away if:  You gain weight quickly.  You have severe chest pain, especially if the pain is crushing or  pressure-like and spreads to the arms, back, neck, or jaw.  You have more swelling in your hands, feet, ankles, or abdomen.  You have nausea.  You have unusual sweating or your skin turns blue or pale.  Your shortness of breath gets worse.  You have dizziness, blurred vision, a headache, or unsteadiness.  Your blood pressure is higher than 180/120.  You cough up bloody mucus (sputum).  You cannot sleep because it is hard to breathe.  You feel a racing heart beat (palpitations).  You have anxiety or a feeling that you cannot get enough air. These symptoms may represent a serious problem that is an emergency. Do not wait to see if the symptoms will go away. Get medical help right away. Call your local emergency services (911 in the [place] Do not drive yourself to the hospital. Summary  Pulmonary edema is a condition in which fluid collects in the air sacs of your lungs. If left untreated, it can lead to a medical emergency.  This condition is most commonly caused by heart failure. Other causes can include infections or injury to the lungs.  Take over-the-counter  and prescription medicines only as told by your health care provider. This information is not intended to replace advice given to you by your health care provider. Make sure you discuss any questions you have with your health care provider. Document Revised: 05/15/2017 Document Reviewed: 08/13/2016 Elsevier Patient Education  2020 [employer].

## 2020-04-16 NOTE — Progress Notes (Signed)
D/C instructions given to patient. Medications reviewed. All questions answered. IV removed, clean and intact. Husband to escort pt home.  Clyde Canterbury, RN

## 2020-04-16 NOTE — Progress Notes (Signed)
O2 Desaturation at night, SPO2 83-89% on room air. O2 NCL 3 LPM given and able to maintain SPO2 92-99%. She's hemodynamically stable. AV- paced on monitor. CCMD called to notify non-sustain VT at 23:19. Pt's asymptomatic, alert and awake, oriented x 4. No acute distress. Continue to monitor.  Kennyth Lose, RN

## 2020-04-16 NOTE — Telephone Encounter (Signed)
Noted  

## 2020-04-19 ENCOUNTER — Ambulatory Visit: Payer: Medicare HMO | Admitting: Nurse Practitioner

## 2020-04-19 ENCOUNTER — Other Ambulatory Visit: Payer: Medicare HMO

## 2020-04-19 ENCOUNTER — Encounter: Payer: Self-pay | Admitting: Nurse Practitioner

## 2020-04-19 VITALS — BP 124/70 | HR 72 | Ht 64.0 in | Wt 140.0 lb

## 2020-04-19 DIAGNOSIS — R197 Diarrhea, unspecified: Secondary | ICD-10-CM

## 2020-04-19 DIAGNOSIS — K219 Gastro-esophageal reflux disease without esophagitis: Secondary | ICD-10-CM

## 2020-04-19 DIAGNOSIS — R1310 Dysphagia, unspecified: Secondary | ICD-10-CM

## 2020-04-19 LAB — CULTURE, BLOOD (ROUTINE X 2)
Culture: NO GROWTH
Culture: NO GROWTH
Special Requests: ADEQUATE

## 2020-04-19 MED ORDER — FAMOTIDINE 20 MG PO TABS: 20.0000 mg | ORAL_TABLET | Freq: Two times a day (BID) | ORAL | 0 refills | Status: DC

## 2020-04-19 NOTE — Patient Instructions (Addendum)
Your provider has requested that you go to the basement level for lab work before leaving today. Press "B" on the elevator. The lab is located at the first door on the left as you exit the elevator.  Take Florastor twice a day for 4 weeks.  Take Imodium daily  Take Lactaid with each dairy product.  We have sent the following medications to your pharmacy for you to pick up at your convenience:  Pepcid  Please follow up with Colleen on 05/03/2020 at 2:00pm  Call if symptoms worsen

## 2020-04-19 NOTE — Progress Notes (Signed)
04/19/2020 ADAIRA CENTOLA [id number] 1950/09/14   CHIEF COMPLAINT: Diarrhea   HISTORY OF PRESENT ILLNESS: [name] is a 69 year old female with a past medical history of CKD, hypertension, Arnold-Chiari malformation, GERD and colon polyps. 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 She was intubated in the ICU then extubated within 24 hours. She developed a new LBBB, ECHO showed dilated cardiomyopathy with EF 25 -30%, s/p cardiac cath 70-80% RCA lesion. She developed atrial fibrillation and she was started on Eliquis and Carvedilol. She was eventually discharged home on 08/07/2019 with a life vest. She was readmitted to the hospital with a TIA per neurology, discharged home 08/15/2019 on Eliquis and ASA. Her life vest went off and she was readmitted to the hospital 08/24/2019 and she underwent placement of a biventricular AICD 08/25/2019.  She continued close follow-up with her PCP Clarene Reamer NP and cardiology.  She was admitted to the hospital on 04/14/2020 due to having SOB. She was treated for congestive heart failure. She was successfully diuresed in the setting of AKI on CKD. She was discharged home on 11/01 with the instructions to follow up with her cardiologist and nephrologist for diuretic management.   She presents to our office today for further evaluation for diarrhea for approximately 4 months. She describes having urgent explosive watery to mud like nonbloody diarrhea 6 to 7 times during the day, sometimes up to 13 episodes and 3 to 4 episodes during the night. No mucous per the rectum. No melena. She has intermittent RUQ pain which is not severe. No N/V. Food doesn't taste right. Decreased appetite. Increased burping. She has lost 7lbs over the past month she attributes to water weight reduced by diuretics. She received IV antibiotics for possible pneumonia during her hospital admission for CHF  10/30 -11/1. A GI pathogen panel specimen was submitted to the lab on 03/27/2020 but was incomplete due to the presence of PCR inhibitors in the specimen, a repeat test was recommended.  H.Pylori stool antigen was negative. She started taking Zoloft 01/19/2020 for anxiety, her diarrhea was active prior to starting Zoloft. She is not taking any Pepto bismol or Imodium. She had prolonged diarrhea in 2017 which did not improve when she Protonix was held and her diarrhea worsened on Xifaxan.  She underwent a colonoscopy 07/09/2015 and a total of 9 tubular adenomatous/hyperplastic polyps were removed from the colon and rectum.  She was advised to repeat a colonoscopy in 3 years which was not done.  She also complains of having dysphagia described as food gets stuck briefly to the upper esophagus which occurs for a few days then gone for 3 to 4 days for the past year. She underwent a swallow study during 08/05/2019 during her hospital admission which showed moderate oropharyngeal dysphagia marked by discoordination and mistiming of swallowing sequence and suspected esophageal disturbance. Esophageal scan revealed barium retention at mid esophagusShe was a moderate risk for aspiration and a dysphagia 3 diet and speech physical therapy as outpatient was recommended. She underwent an EGD 11/29/2015 which showed nummular lesions in the esophagus, biopsies were benign without evidence of candidiasis.  The EGD was otherwise normal.  CBC Latest Ref Rng & Units 04/14/2020 04/14/2020 03/06/2020  WBC 4.0 - 10.5 K/uL - 6.5 8.3  Hemoglobin 12.0 - 15.0 g/dL 10.9(L) 10.5(L) 10.7(L)  Hematocrit 36 - 46 % 32.0(L) 34.3(L) 35.0(L)  Platelets 150 - 400 K/uL -  196 208    CMP Latest Ref Rng & Units 04/16/2020 04/15/2020 04/14/2020  Glucose 70 - 99 mg/dL 99 98 112(H)  BUN 8 - 23 mg/dL 22 21 20   Creatinine 0.44 - 1.00 mg/dL 3.14(H) 3.16(H) 3.30(H)  Sodium 135 - 145 mmol/L 139 141 140  Potassium 3.5 - 5.1 mmol/L 3.4(L) 3.7 4.2    Chloride 98 - 111 mmol/L 99 101 101  CO2 22 - 32 mmol/L 27 29 -  Calcium 8.9 - 10.3 mg/dL 9.2 9.1 -  Total Protein 6.5 - 8.1 g/dL - - -  Total Bilirubin 0.3 - 1.2 mg/dL - - -  Alkaline Phos 38 - 126 U/L - - -  AST 15 - 41 U/L - - -  ALT 0 - 44 U/L - - -   EGD 11/29/2015: White nummular lesions in esophageal mucosa. Biopsied. - The examination was otherwise normal. - Normal stomach. - Normal examined duodenum. Surgical [P], distal esophagus Biopsies: -BENIGN SQUAMOUS MUCOSA WITH NO HISTOPATHOLOGIC ABNORMALITY. -A PAS-F STAIN IS NEGATIVE FOR FUNGUS. -NEGATIVE FOR EOSINOPHILIC ESOPHAGITIS, DYSPLASIA OR MALIGNANCY.  Colonoscopy 07/09/2015: 1. Six sessile polyps ranging between 5-56mm in size were found in the rectum, sigmoid colon, and transverse colon; polypectomies were performed with a cold snare 2. Semi-pedunculated polyp ranging between 5-27mm in size was found in the sigmoid colon; polypectomy was performed using snare cautery 3. Two sessile polyps ranging between 3-31mm in size were found in the descending colon; polypectomies were performed with cold forceps - 3 year colonoscopy recall Biopsies:  Surgical [P], rectum, sigmoid, descending and transverse, polyp (7) - TUBULAR ADENOMA(S) (MULTIPLE FRAGMENTS). - HYPERPLASTIC POLYP(S) (MULTIPLE FRAGMENTS). - HIGH GRADE DYSPLASIA IS NOT IDENTIFIED  Past Medical History:  Diagnosis Date  . Allergy   . Anxiety   . Arnold-Chiari malformation ([place])   . Arthritis   . CHF (congestive heart failure) ([place])   . Chronic systolic heart failure ([place]) 11/03/2019  . Coronary artery disease   . Encounter for assessment of implantable cardioverter-defibrillator (ICD) 11/10/2019  . GERD (gastroesophageal reflux disease)   . H. pylori infection    3 years ago  . H/O cardiac arrest    Hx of VT/Vfib arrest  . Hyperlipidemia   . ICD BiV Medtronic model [id number] Claria Quad CRT-D SureScan ICD 08/25/2019   . Incontinence   . Paroxysmal atrial  fibrillation ([place]) 08/06/2019  . Syncope and collapse    Per pt, denies passing out  . Tobacco use disorder   . Unspecified essential hypertension    Past Surgical History:  Procedure Laterality Date  . APPENDECTOMY    . ARNOLD CHIARI SURGERY     neurocranial surgery  . BIV ICD INSERTION CRT-D N/A 08/25/2019   Procedure: BIV ICD INSERTION CRT-D;  Surgeon: Constance Haw, MD;  Location: [facility];  Service: Cardiovascular;  Laterality: N/A;  . BLEPHAROPLASTY     Bil  . C-EYE SURGERY PROCEDURE    . CARDIAC CATHETERIZATION    . CHOLECYSTECTOMY    . EYE SURGERY    . heart failure    . LEFT HEART CATH AND CORONARY ANGIOGRAPHY N/A 08/04/2019   Procedure: LEFT HEART CATH AND CORONARY ANGIOGRAPHY;  Surgeon: Adrian Prows, MD;  Location: [facility];  Service: Cardiovascular;  Laterality: N/A;  . MASS EXCISION Right 12/27/2013   Procedure: MINOR EXCISION OF RIGHT THUMB MUCOID CYST, DEBRIDEMENT OF INTERPHALANGEAL JOINT;  Surgeon: Cammie Sickle, MD;  Location: [facility];  Service: Orthopedics;  Laterality: Right;  . mass on thumb  right  . TOTAL KNEE ARTHROPLASTY Right 02/17/2017  . TOTAL KNEE ARTHROPLASTY Right 02/17/2017   Procedure: TOTAL KNEE ARTHROPLASTY;  Surgeon: Melrose Nakayama, MD;  Location: [facility];  Service: Orthopedics;  Laterality: Right;  . TUBAL LIGATION     Social History: She is married. Retired. She has one son and one daughter. She smoked cigarettes 1ppd x 50 years, she quit smoking 8 months ago. No alcohol or drug use.   Family History: Mother with history of bone cancer and hypertension. Father with history of TB.   Allergies  Allergen Reactions  . Shellfish-Derived Products Anaphylaxis  . Ativan [Lorazepam] Other (See Comments)    Makes "skin crawl" and insomnia  . Buspar [Buspirone] Nausea Only    Sweating and dizzy  . Clarithromycin Swelling  . Codeine Nausea And Vomiting  . Doxycycline Other (See Comments)    Unknown  .  Guaifenesin Other (See Comments)    Palpitations  . Oxycodone Nausea And Vomiting  . Prednisone Nausea And Vomiting and Other (See Comments)    Makes my heart race  . Statins Other (See Comments)    Muscle pain  . Sulfonamide Derivatives Nausea And Vomiting    Achiness  . Tetracycline Nausea Only  . Vicodin [Hydrocodone-Acetaminophen] Nausea And Vomiting  . Famotidine Rash  . Latex Rash  . Omeprazole Nausea Only    Cough, shortness of breath - patient doesn't remember  . Peanut-Containing Drug Products Itching and Rash  . Protonix [Pantoprazole] Rash  . Wellbutrin [Bupropion] Palpitations      Outpatient Encounter Medications as of 04/19/2020  Medication Sig  . acetaminophen (TYLENOL) 500 MG tablet Take 1,000 mg by mouth 2 (two) times daily as needed (pain).  [name] apixaban (ELIQUIS) 5 MG TABS tablet Take 1 tablet (5 mg total) by mouth 2 (two) times daily.  [name] aspirin 81 MG chewable tablet Chew 1 tablet (81 mg total) by mouth daily.  . carvedilol (COREG) 12.5 MG tablet TAKE 1 TABLET (12.5 MG TOTAL) BY MOUTH 2 (TWO) TIMES DAILY WITH A MEAL. (Patient taking differently: Take 6.25 mg by mouth at bedtime. )  . Cholecalciferol (VITAMIN D3 PO) Take 1,000 Units by mouth in the morning and at bedtime.  . Cyanocobalamin (B12 FAST DISSOLVE PO) Take 1 tablet by mouth daily.   [name] docusate sodium (COLACE) 100 MG capsule Take 1 capsule (100 mg total) by mouth daily as needed for mild constipation.  [name] ezetimibe (ZETIA) 10 MG tablet TAKE 1 TABLET BY MOUTH EVERY DAY (Patient taking differently: Take 10 mg by mouth daily. )  . Famotidine (PEPCID PO) Take 20 mg by mouth in the morning and at bedtime.   [name] loratadine (CLARITIN) 10 MG tablet Take 10 mg by mouth daily as needed for allergies.   . Magnesium 400 MG TABS Take 400 mg by mouth 2 (two) times daily.   . nitroGLYCERIN (NITROSTAT) 0.4 MG SL tablet Place 1 tablet (0.4 mg total) under the tongue every 5 (five) minutes as needed for chest pain.  [name]  ondansetron (ZOFRAN ODT) 4 MG disintegrating tablet Take 1 tablet (4 mg total) by mouth every 8 (eight) hours as needed for nausea or vomiting.  . sertraline (ZOLOFT) 50 MG tablet Take 1 tablet (50 mg total) by mouth at bedtime. To start medication- take 1/2 tablet at bedtime for 7 days then increase to 1 tablet at bedtime. (Patient taking differently: Take 50 mg by mouth at bedtime. )  .  torsemide (DEMADEX) 5 MG tablet Take 1 tablet (5 mg total) by mouth daily.  . rosuvastatin (CRESTOR) 10 MG tablet TAKE 1 TABLET (10 MG TOTAL) BY MOUTH DAILY AT 6 PM.   No facility-administered encounter medications on file as of 04/19/2020.     REVIEW OF SYSTEMS: Gen: Denies fever, sweats or chills. Weight fluctuates up and down.  CV: See HPI.  Resp: + SOB. GI: See HPI.  GU : Deniescurrent urinary burning, blood in urine, increased urinary frequency or incontinence MS: Denies joint pain, muscles aches or weakness. Derm: Denies rash, itchiness, skin lesions or unhealing ulcers. Psych: + anxiety and depression.  Heme: Denies bruising, bleeding. Neuro:  Denies headaches, dizziness or paresthesias. Endo:  Denies any problems with DM, thyroid or adrenal function.  PHYSICAL EXAM: BP 124/70   Pulse 72   Ht 5\' 4"  (1.626 m)   Wt 140 lb (63.5 kg)   BMI 24.03 kg/m   Wt Readings from Last 3 Encounters:  04/19/20 140 lb (63.5 kg)  04/16/20 134 lb 12.8 oz (61.1 kg)  03/26/20 147 lb 4 oz (66.8 kg)   General: 69 year old female in no acute distress. Head: Normocephalic and atraumatic. Eyes:  Sclerae non-icteric, conjunctive pink. Ears: Normal auditory acuity. Mouth: Dentition intact. No ulcers or lesions.  Neck: Supple, no lymphadenopathy or thyromegaly.  Chest: Left subclavian AICD. Lungs: Clear bilaterally to auscultation without wheezes, crackles or rhonchi. Heart: Regular rate and rhythm. No murmur, rub or gallop appreciated.  Abdomen: Soft, nontender, non distended. No masses. No hepatosplenomegaly.  Normoactive bowel sounds x 4 quadrants.  Rectal: Deferred. Musculoskeletal: Symmetrical with no gross deformities. Skin: Warm and dry. No rash or lesions on visible extremities. Extremities: No edema. Neurological: Alert oriented x 4, no focal deficits.  Psychological:  Alert and cooperative. Normal mood and affect.  ASSESSMENT AND PLAN:  52. 69 year old female with diarrhea, rule out infectious etiology -GI pathogen panel -Florastsor probiotic one po bid x 4 weeks -Imodium 1 tab daily -Lactaid 1 to 2 tabs with each dairy product  -BMP -Conservative management, defer endoscopic evaluation due to multiple comorbidities including MI/cardiac arrest, afib, CHF and CVA   2. RUQ pain, intermittent  -Consider CTAP after the above lab results received  -Continue Famotidine 20mg  po bid  3. Dysphagia, GI etiology verses neurological etiology in setting of a recent CVA, rare cases of Roselie Awkward chiari develop dysphagia.  -To further discuss scheduling a barium swallow and speech physical therapy at the time of follow up appointment in 2 weeks.  -Defer endoscopic evaluation at this time due to multiple comorbidities  4. Significant history of CAD, cardiac arrest with AICD afib on Eliquis   5. CVA 07/2019  6. History of numerous tubular adenomatous and hyperplastic polyps per colonoscopy 06/2015.  Call colonoscopy was due 06/2018 but was not completed. -Defer surveillance colonoscopy for now due to multiple comorbidities  7. Anemia, most likely due to chronic disease, CKD. No overt GI bleeding. Hg stable 10.9 - 10.5. MCV 90.5.  8. CKD  9. Anxiety and depression on Zoloft since 01/2020  Further recommendations per Dr. Silverio Decamp    CC:  Carlean Purl Dalbert Batman, FNP

## 2020-04-20 ENCOUNTER — Encounter: Payer: Self-pay | Admitting: Nurse Practitioner

## 2020-04-23 ENCOUNTER — Telehealth: Payer: Self-pay | Admitting: Radiology

## 2020-04-23 ENCOUNTER — Encounter: Payer: Self-pay | Admitting: Cardiology

## 2020-04-23 ENCOUNTER — Ambulatory Visit (INDEPENDENT_AMBULATORY_CARE_PROVIDER_SITE_OTHER): Payer: Medicare HMO | Admitting: Family Medicine

## 2020-04-23 ENCOUNTER — Encounter: Payer: Self-pay | Admitting: Family Medicine

## 2020-04-23 ENCOUNTER — Ambulatory Visit: Payer: Medicare HMO | Admitting: Cardiology

## 2020-04-23 ENCOUNTER — Other Ambulatory Visit: Payer: Self-pay

## 2020-04-23 VITALS — BP 122/64 | HR 87 | Temp 97.6°F | Ht 64.0 in | Wt 136.0 lb

## 2020-04-23 VITALS — BP 131/56 | HR 84 | Resp 16 | Ht 64.0 in | Wt 137.0 lb

## 2020-04-23 DIAGNOSIS — Z95810 Presence of automatic (implantable) cardiac defibrillator: Secondary | ICD-10-CM

## 2020-04-23 DIAGNOSIS — E782 Mixed hyperlipidemia: Secondary | ICD-10-CM

## 2020-04-23 DIAGNOSIS — N1831 Chronic kidney disease, stage 3a: Secondary | ICD-10-CM | POA: Diagnosis not present

## 2020-04-23 DIAGNOSIS — I5023 Acute on chronic systolic (congestive) heart failure: Secondary | ICD-10-CM

## 2020-04-23 DIAGNOSIS — Z7901 Long term (current) use of anticoagulants: Secondary | ICD-10-CM

## 2020-04-23 DIAGNOSIS — I5022 Chronic systolic (congestive) heart failure: Secondary | ICD-10-CM

## 2020-04-23 DIAGNOSIS — E538 Deficiency of other specified B group vitamins: Secondary | ICD-10-CM

## 2020-04-23 DIAGNOSIS — I480 Paroxysmal atrial fibrillation: Secondary | ICD-10-CM

## 2020-04-23 DIAGNOSIS — I10 Essential (primary) hypertension: Secondary | ICD-10-CM | POA: Diagnosis not present

## 2020-04-23 DIAGNOSIS — Z09 Encounter for follow-up examination after completed treatment for conditions other than malignant neoplasm: Secondary | ICD-10-CM

## 2020-04-23 DIAGNOSIS — I469 Cardiac arrest, cause unspecified: Secondary | ICD-10-CM

## 2020-04-23 DIAGNOSIS — Z8679 Personal history of other diseases of the circulatory system: Secondary | ICD-10-CM | POA: Diagnosis not present

## 2020-04-23 DIAGNOSIS — Z87891 Personal history of nicotine dependence: Secondary | ICD-10-CM

## 2020-04-23 DIAGNOSIS — E559 Vitamin D deficiency, unspecified: Secondary | ICD-10-CM | POA: Diagnosis not present

## 2020-04-23 DIAGNOSIS — I429 Cardiomyopathy, unspecified: Secondary | ICD-10-CM | POA: Diagnosis not present

## 2020-04-23 DIAGNOSIS — R3129 Other microscopic hematuria: Secondary | ICD-10-CM | POA: Diagnosis not present

## 2020-04-23 DIAGNOSIS — Z8673 Personal history of transient ischemic attack (TIA), and cerebral infarction without residual deficits: Secondary | ICD-10-CM

## 2020-04-23 DIAGNOSIS — I447 Left bundle-branch block, unspecified: Secondary | ICD-10-CM

## 2020-04-23 DIAGNOSIS — I4901 Ventricular fibrillation: Secondary | ICD-10-CM

## 2020-04-23 LAB — URINALYSIS, ROUTINE W REFLEX MICROSCOPIC
Bilirubin Urine: NEGATIVE
Ketones, ur: NEGATIVE
Leukocytes,Ua: NEGATIVE
Nitrite: NEGATIVE
Specific Gravity, Urine: 1.015 (ref 1.000–1.030)
Total Protein, Urine: NEGATIVE
Urine Glucose: NEGATIVE
Urobilinogen, UA: 0.2 (ref 0.0–1.0)
pH: 6 (ref 5.0–8.0)

## 2020-04-23 LAB — BASIC METABOLIC PANEL
BUN: 32 mg/dL — ABNORMAL HIGH (ref 6–23)
CO2: 30 mEq/L (ref 19–32)
Calcium: 8.9 mg/dL (ref 8.4–10.5)
Chloride: 99 mEq/L (ref 96–112)
Creatinine, Ser: 3.27 mg/dL — ABNORMAL HIGH (ref 0.40–1.20)
GFR: 13.84 mL/min — CL (ref >60.00)
Glucose, Bld: 117 mg/dL — ABNORMAL HIGH (ref 70–99)
Potassium: 4 mEq/L (ref 3.5–5.1)
Sodium: 137 mEq/L (ref 135–145)

## 2020-04-23 LAB — CBC WITH DIFFERENTIAL/PLATELET
Basophils Absolute: 0.1 10*3/uL (ref 0.0–0.1)
Basophils Relative: 0.9 % (ref 0.0–3.0)
Eosinophils Absolute: 0.4 10*3/uL (ref 0.0–0.7)
Eosinophils Relative: 7.5 % — ABNORMAL HIGH (ref 0.0–5.0)
HCT: 32.8 % — ABNORMAL LOW (ref 36.0–46.0)
Hemoglobin: 10.7 g/dL — ABNORMAL LOW (ref 12.0–15.0)
Lymphocytes Relative: 16.4 % (ref 12.0–46.0)
Lymphs Abs: 0.9 10*3/uL (ref 0.7–4.0)
MCHC: 32.5 g/dL (ref 30.0–36.0)
MCV: 85.5 fl (ref 78.0–100.0)
Monocytes Absolute: 0.4 10*3/uL (ref 0.1–1.0)
Monocytes Relative: 7.1 % (ref 3.0–12.0)
Neutro Abs: 3.7 10*3/uL (ref 1.4–7.7)
Neutrophils Relative %: 68.1 % (ref 43.0–77.0)
Platelets: 223 10*3/uL (ref 150.0–400.0)
RBC: 3.84 Mil/uL — ABNORMAL LOW (ref 3.87–5.11)
RDW: 14.5 % (ref 11.5–15.5)
WBC: 5.4 10*3/uL (ref 4.0–10.5)

## 2020-04-23 LAB — VITAMIN B12: Vitamin B-12: >1526 pg/mL — ABNORMAL HIGH (ref 211–911)

## 2020-04-23 LAB — VITAMIN D 25 HYDROXY (VIT D DEFICIENCY, FRACTURES): VITD: 34.89 ng/mL (ref 30.00–100.00)

## 2020-04-23 MED ORDER — TORSEMIDE 5 MG PO TABS: 5.0000 mg | ORAL_TABLET | ORAL | 3 refills | Status: DC

## 2020-04-23 NOTE — Telephone Encounter (Signed)
[facility] called a critical GFR - 13.84. Results given to Tor Netters

## 2020-04-23 NOTE — Patient Instructions (Addendum)
Tums- do not take more than 8 in a day

## 2020-04-23 NOTE — Telephone Encounter (Signed)
Patient's daughter [name] notified and we discussed follow up with nephrology ASAP. Patient's daughter said that cardiology recommended earlier follow up with nephrology as well. I did not see nephrology notes in EMR, but was able to send staff message to nephrologist, Corliss Parish, MD and patient's daughter will call first thing in am for follow up.

## 2020-04-23 NOTE — Progress Notes (Signed)
Subjective:    Patient ID: [name], female    DOB: 1951-03-14, 69 y.o.   MRN: [id number]  HPI Chief Complaint  Patient presents with   Hospitalization Follow-up    fluid on lungs, pt states have no energy   This is a 69 yo female who presents today for hospital follow up. She is accompanied by her daughter. She has been feeling tired.   She was admitted from 10/30- 04/16/2020 with heart failure and acute respiratory failure. She had interstitial thickening with hazy lung base opacity suspicious for mild chf, lung base pneumonia. She was given ceftriaxone and furosemide. She was diuresed and had improvement of kidney function. Recommended that she follow up with cardiology, nephrology. She has upcoming appointment with cardiology this afternoon.   Diarrhea- has seen GI recently, awaiting stool study collection. Left lower abdominal and right upper abdominal pain, achy, comes and goes, has been long standing. Taking Tums/ rolaids prn. Frequent belching.    Current Outpatient Medications:    acetaminophen (TYLENOL) 500 MG tablet, Take 1,000 mg by mouth 2 (two) times daily as needed (pain)., Disp: , Rfl:    apixaban (ELIQUIS) 5 MG TABS tablet, Take 1 tablet (5 mg total) by mouth 2 (two) times daily., Disp: 180 tablet, Rfl: 3   aspirin 81 MG chewable tablet, Chew 1 tablet (81 mg total) by mouth daily., Disp: 30 tablet, Rfl: 0   carvedilol (COREG) 12.5 MG tablet, TAKE 1 TABLET (12.5 MG TOTAL) BY MOUTH 2 (TWO) TIMES DAILY WITH A MEAL. (Patient taking differently: Take 6.25 mg by mouth at bedtime. ), Disp: 180 tablet, Rfl: 1   Cholecalciferol (VITAMIN D3 PO), Take 1,000 Units by mouth in the morning and at bedtime., Disp: , Rfl:    Cyanocobalamin (B12 FAST DISSOLVE PO), Take 1 tablet by mouth daily. , Disp: , Rfl:    docusate sodium (COLACE) 100 MG capsule, Take 1 capsule (100 mg total) by mouth daily as needed for mild constipation., Disp: 30 capsule, Rfl: 0   ezetimibe (ZETIA) 10  MG tablet, TAKE 1 TABLET BY MOUTH EVERY DAY (Patient taking differently: Take 10 mg by mouth daily. ), Disp: 90 tablet, Rfl: 1   Magnesium 400 MG TABS, Take 400 mg by mouth 2 (two) times daily. , Disp: , Rfl:    nitroGLYCERIN (NITROSTAT) 0.4 MG SL tablet, Place 1 tablet (0.4 mg total) under the tongue every 5 (five) minutes as needed for chest pain., Disp: 30 tablet, Rfl: 3   ondansetron (ZOFRAN ODT) 4 MG disintegrating tablet, Take 1 tablet (4 mg total) by mouth every 8 (eight) hours as needed for nausea or vomiting., Disp: 20 tablet, Rfl: 1   sertraline (ZOLOFT) 50 MG tablet, Take 1 tablet (50 mg total) by mouth at bedtime. To start medication- take 1/2 tablet at bedtime for 7 days then increase to 1 tablet at bedtime. (Patient taking differently: Take 50 mg by mouth at bedtime. ), Disp: 90 tablet, Rfl: 3   Famotidine (PEPCID PO), Take 20 mg by mouth in the morning and at bedtime. , Disp: , Rfl:    rosuvastatin (CRESTOR) 10 MG tablet, TAKE 1 TABLET (10 MG TOTAL) BY MOUTH DAILY AT 6 PM., Disp: 90 tablet, Rfl: 1   torsemide (DEMADEX) 5 MG tablet, Take 1 tablet (5 mg total) by mouth every Monday, Wednesday, and Friday., Disp: 30 tablet, Rfl: 3 Past Medical History:  Diagnosis Date   Allergy    Anxiety    Arnold-Chiari malformation ([place])  Arthritis    CHF (congestive heart failure) (HCC)    Chronic systolic heart failure ([place]) 11/03/2019   Coronary artery disease    Encounter for assessment of implantable cardioverter-defibrillator (ICD) 11/10/2019   GERD (gastroesophageal reflux disease)    H. pylori infection    3 years ago   H/O cardiac arrest    Hx of VT/Vfib arrest   Hyperlipidemia    ICD BiV Medtronic model [id number] [place] Quad CRT-D SureScan ICD 08/25/2019    Incontinence    Paroxysmal atrial fibrillation ([place]) 08/06/2019   Pulmonary edema 2021   Syncope and collapse    Per pt, denies passing out   Tobacco use disorder    Unspecified essential hypertension      Past Surgical History:  Procedure Laterality Date   APPENDECTOMY     ARNOLD CHIARI SURGERY     neurocranial surgery   BIV ICD INSERTION CRT-D N/A 08/25/2019   Procedure: BIV ICD INSERTION CRT-D;  Surgeon: Constance Haw, MD;  Location: [facility];  Service: Cardiovascular;  Laterality: N/A;   BLEPHAROPLASTY     Bil   C-EYE SURGERY PROCEDURE     CARDIAC CATHETERIZATION     CHOLECYSTECTOMY     EYE SURGERY     heart failure     LEFT HEART CATH AND CORONARY ANGIOGRAPHY N/A 08/04/2019   Procedure: LEFT HEART CATH AND CORONARY ANGIOGRAPHY;  Surgeon: Adrian Prows, MD;  Location: [facility];  Service: Cardiovascular;  Laterality: N/A;   MASS EXCISION Right 12/27/2013   Procedure: MINOR EXCISION OF RIGHT THUMB MUCOID CYST, DEBRIDEMENT OF INTERPHALANGEAL JOINT;  Surgeon: Cammie Sickle, MD;  Location: [facility];  Service: Orthopedics;  Laterality: Right;   mass on thumb  right   TOTAL KNEE ARTHROPLASTY Right 02/17/2017   TOTAL KNEE ARTHROPLASTY Right 02/17/2017   Procedure: TOTAL KNEE ARTHROPLASTY;  Surgeon: Melrose Nakayama, MD;  Location: [facility];  Service: Orthopedics;  Laterality: Right;   TUBAL LIGATION     Family History  Problem Relation Age of Onset   Bone cancer Mother    Heart disease Mother    Cancer Sister        metastatic; unknown primary   Diverticulosis Sister    Tuberculosis Father    Diabetes Sister    Hypertension Sister    Colon cancer Neg Hx    Esophageal cancer Neg Hx    Pancreatic cancer Neg Hx    Stomach cancer Neg Hx    Liver disease Neg Hx    Kidney disease Neg Hx    Rectal cancer Neg Hx    Pancreatic disease Neg Hx    Social History   Tobacco Use   Smoking status: Former Smoker    Packs/day: 1.00    Years: 50.00    Pack years: 50.00    Types: Cigarettes    Quit date: 08/08/2019    Years since quitting: 0.7   Smokeless tobacco: Never Used  Vaping Use   Vaping Use: Never used   Substance Use Topics   Alcohol use: No    Alcohol/week: 0.0 standard drinks   Drug use: No    Review of Systems Per HPI    Objective:   Physical Exam Constitutional:      General: She is not in acute distress.    Appearance: She is normal weight. She is not toxic-appearing or diaphoretic.     Comments: Appears frail. Ambulating without assistance.   HENT:  Head: Normocephalic and atraumatic.  Eyes:     Conjunctiva/sclera: Conjunctivae normal.  Cardiovascular:     Rate and Rhythm: Normal rate and regular rhythm.     Heart sounds: Normal heart sounds.  Pulmonary:     Effort: Pulmonary effort is normal.     Breath sounds: Normal breath sounds.  Musculoskeletal:     Right lower leg: No edema.     Left lower leg: No edema.  Skin:    General: Skin is warm and dry.  Neurological:     Mental Status: She is alert and oriented to person, place, and time.  Psychiatric:        Mood and Affect: Mood normal.        Behavior: Behavior normal.        Thought Content: Thought content normal.        Judgment: Judgment normal.       BP 122/64    Pulse 87    Temp 97.6 F (36.4 C) (Temporal)    Ht 5\' 4"  (1.626 m)    Wt 136 lb (61.7 kg)    SpO2 97%    BMI 23.34 kg/m  Wt Readings from Last 3 Encounters:  04/23/20 136 lb (61.7 kg)  04/19/20 140 lb (63.5 kg)  04/16/20 134 lb 12.8 oz (61.1 kg)       Assessment & Plan:  1. Hospital discharge follow-up - Reviewed medications, instructions, labs today, cardiology follow up later today  2. Stage 3a chronic kidney disease (HCC) - CBC with Differential - Basic Metabolic Panel  3. Acute on chronic HFrEF (heart failure with reduced ejection fraction) (HCC) - CBC with Differential - Basic Metabolic Panel  4. Other microscopic hematuria - Urinalysis, Routine w reflex microscopic  5. Vitamin B12 deficiency - Vitamin B12  6. Vitamin D deficiency - VITAMIN D 25 Hydroxy (Vit-D Deficiency, Fractures)  This visit occurred during  the SARS-CoV-2 public health emergency.  Safety protocols were in place, including screening questions prior to the visit, additional usage of staff PPE, and extensive cleaning of exam room while observing appropriate contact time as indicated for disinfecting solutions.    Clarene Reamer, FNP-BC  [facility], [facility]  04/24/2020 9:13 AM

## 2020-04-23 NOTE — Progress Notes (Addendum)
[name] Date of Birth: 08-27-1950 MRN: [id number] Primary Care Provider:Gessner, Dalbert Batman, FNP Primary Cardiologist: Janice Kras, DO Electrophysiologist: Dr. Reggy Eye   Date: 04/23/20 Last Office Visit: 03/02/2020  Chief Complaint  Patient presents with  . Chronic HFrEF  . Acute Pulmonary Edema  . Hospitalization Follow-up   HPI  [name] is a 69 y.o. female who presents to the office with a chief complaint of "management of  heart failure." Patient's past medical history and cardiac risk factors include: Hypertension, hyperlipidemia, former smoker, history of Arnold-Chiari malformation, history of VT/VF, history of torsades, cardiac arrest, nonischemic cardiomyopathy, peripheral vascular disease, paroxysmal atrial fibrillation, left bundle branch block, history of CVA.  Patient is accompanied by her daughter [name] at today's office visit.   Patient presents to the office sooner than her scheduled visit for the management of her heart failure with reduced EF.  Since last office visit patient states that she was doing well until October she called the office complaining of shortness of breath.  She was started on a very low-dose of torsemide 5 mg p.o. daily and also prescribed sublingual nitroglycerin tablets to use for chest discomfort.  Patient stated that she had worsening of shortness of breath and had gone to the hospital twice since last office visit.  Once she was in triage for more than 9 hours and was tired of waiting and left AMA due to prolonged wait time.  She did well with regards to shortness of breath for couple weeks.  However, her symptoms resurface and called 911 and was taken to [facility] again for further evaluation and management.  Patient states that she was in the hospital from 04/14/2020 and discharged on 04/16/2020.  She was diuresed and lost 5 pounds as a result and was discharged home and to follow-up with cardiology and nephrology  as outpatient.  Since discharge patient states that she is doing well.  She does not have any chest pain or shortness of breath.  She is concerned that she continues to lose weight and currently seeing gastroenterology as well.  Independently reviewed the creatinine trends over the last several months.  And his serum creatinine continues to trend upward.  This was discussed with both the patient and her daughter at today's office visit.  Clinically patient is euvolemic and not in congestive heart failure.  Last BiV ICD interrogation was performed on February 24, 2020.  Results reviewed with the patient and her daughter at today's office visit.  ALLERGIES: Allergies  Allergen Reactions  . Shellfish-Derived Products Anaphylaxis  . Ativan [Lorazepam] Other (See Comments)    Makes "skin crawl" and insomnia  . Buspar [Buspirone] Nausea Only    Sweating and dizzy  . Clarithromycin Swelling  . Codeine Nausea And Vomiting  . Doxycycline Other (See Comments)    Unknown  . Guaifenesin Other (See Comments)    Palpitations  . Oxycodone Nausea And Vomiting  . Prednisone Nausea And Vomiting and Other (See Comments)    Makes my heart race  . Statins Other (See Comments)    Muscle pain  . Sulfonamide Derivatives Nausea And Vomiting    Achiness  . Tetracycline Nausea Only  . Vicodin [Hydrocodone-Acetaminophen] Nausea And Vomiting  . Famotidine Rash  . Latex Rash  . Omeprazole Nausea Only    Cough, shortness of breath - patient doesn't remember  . Peanut-Containing Drug Products Itching and Rash  . Protonix [Pantoprazole] Rash  . Wellbutrin [Bupropion] Palpitations   MEDICATION LIST  PRIOR TO VISIT: Current Outpatient Medications on File Prior to Visit  Medication Sig Dispense Refill  . apixaban (ELIQUIS) 5 MG TABS tablet Take 1 tablet (5 mg total) by mouth 2 (two) times daily. 180 tablet 3  . aspirin 81 MG chewable tablet Chew 1 tablet (81 mg total) by mouth daily. 30 tablet 0  .  carvedilol (COREG) 12.5 MG tablet TAKE 1 TABLET (12.5 MG TOTAL) BY MOUTH 2 (TWO) TIMES DAILY WITH A MEAL. (Patient taking differently: Take 6.25 mg by mouth at bedtime. ) 180 tablet 1  . Cholecalciferol (VITAMIN D3 PO) Take 1,000 Units by mouth in the morning and at bedtime.    . Cyanocobalamin (B12 FAST DISSOLVE PO) Take 1 tablet by mouth daily.     [name] docusate sodium (COLACE) 100 MG capsule Take 1 capsule (100 mg total) by mouth daily as needed for mild constipation. 30 capsule 0  . ezetimibe (ZETIA) 10 MG tablet TAKE 1 TABLET BY MOUTH EVERY DAY (Patient taking differently: Take 10 mg by mouth daily. ) 90 tablet 1  . Famotidine (PEPCID PO) Take 20 mg by mouth in the morning and at bedtime.     . Magnesium 400 MG TABS Take 400 mg by mouth 2 (two) times daily.     . nitroGLYCERIN (NITROSTAT) 0.4 MG SL tablet Place 1 tablet (0.4 mg total) under the tongue every 5 (five) minutes as needed for chest pain. 30 tablet 3  . ondansetron (ZOFRAN ODT) 4 MG disintegrating tablet Take 1 tablet (4 mg total) by mouth every 8 (eight) hours as needed for nausea or vomiting. 20 tablet 1  . sertraline (ZOLOFT) 50 MG tablet Take 1 tablet (50 mg total) by mouth at bedtime. To start medication- take 1/2 tablet at bedtime for 7 days then increase to 1 tablet at bedtime. (Patient taking differently: Take 50 mg by mouth at bedtime. ) 90 tablet 3  . acetaminophen (TYLENOL) 500 MG tablet Take 1,000 mg by mouth 2 (two) times daily as needed (pain).    . rosuvastatin (CRESTOR) 10 MG tablet TAKE 1 TABLET (10 MG TOTAL) BY MOUTH DAILY AT 6 PM. 90 tablet 1   No current facility-administered medications on file prior to visit.    PAST MEDICAL HISTORY: Past Medical History:  Diagnosis Date  . Allergy   . Anxiety   . Arnold-Chiari malformation ([place])   . Arthritis   . CHF (congestive heart failure) ([place])   . Chronic systolic heart failure ([place]) 11/03/2019  . Coronary artery disease   . Encounter for assessment of implantable  cardioverter-defibrillator (ICD) 11/10/2019  . GERD (gastroesophageal reflux disease)   . H. pylori infection    3 years ago  . H/O cardiac arrest    Hx of VT/Vfib arrest  . Hyperlipidemia   . ICD BiV Medtronic model [id number] Claria Quad CRT-D SureScan ICD 08/25/2019   . Incontinence   . Paroxysmal atrial fibrillation ([place]) 08/06/2019  . Pulmonary edema 2021  . Syncope and collapse    Per pt, denies passing out  . Tobacco use disorder   . Unspecified essential hypertension     PAST SURGICAL HISTORY: Past Surgical History:  Procedure Laterality Date  . APPENDECTOMY    . ARNOLD CHIARI SURGERY     neurocranial surgery  . BIV ICD INSERTION CRT-D N/A 08/25/2019   Procedure: BIV ICD INSERTION CRT-D;  Surgeon: Constance Haw, MD;  Location: [place];  Service: Cardiovascular;  Laterality: N/A;  . BLEPHAROPLASTY  Bil  . C-EYE SURGERY PROCEDURE    . CARDIAC CATHETERIZATION    . CHOLECYSTECTOMY    . EYE SURGERY    . heart failure    . LEFT HEART CATH AND CORONARY ANGIOGRAPHY N/A 08/04/2019   Procedure: LEFT HEART CATH AND CORONARY ANGIOGRAPHY;  Surgeon: Adrian Prows, MD;  Location: [facility];  Service: Cardiovascular;  Laterality: N/A;  . MASS EXCISION Right 12/27/2013   Procedure: MINOR EXCISION OF RIGHT THUMB MUCOID CYST, DEBRIDEMENT OF INTERPHALANGEAL JOINT;  Surgeon: Cammie Sickle, MD;  Location: [facility];  Service: Orthopedics;  Laterality: Right;  . mass on thumb  right  . TOTAL KNEE ARTHROPLASTY Right 02/17/2017  . TOTAL KNEE ARTHROPLASTY Right 02/17/2017   Procedure: TOTAL KNEE ARTHROPLASTY;  Surgeon: Melrose Nakayama, MD;  Location: [place];  Service: Orthopedics;  Laterality: Right;  . TUBAL LIGATION      FAMILY HISTORY: The patient family history includes Bone cancer in her mother; Cancer in her sister; Diabetes in her sister; Diverticulosis in her sister; Heart disease in her mother; Hypertension in her sister; Tuberculosis in her  father.   SOCIAL HISTORY:  The patient  reports that she quit smoking about 8 months ago. Her smoking use included cigarettes. She has a 50.00 pack-year smoking history. She has never used smokeless tobacco. She reports that she does not drink alcohol and does not use drugs.  Review of Systems  Constitutional: Positive for weight loss. Negative for chills and fever.  HENT: Negative for hoarse voice and nosebleeds.   Eyes: Negative for discharge, double vision and pain.  Cardiovascular: Negative for chest pain, claudication, dyspnea on exertion, leg swelling, near-syncope, orthopnea, palpitations, paroxysmal nocturnal dyspnea and syncope.  Respiratory: Positive for shortness of breath (chronic and stable.). Negative for hemoptysis.   Musculoskeletal: Negative for muscle cramps and myalgias.  Gastrointestinal: Negative for abdominal pain, constipation, diarrhea, hematemesis, hematochezia, melena, nausea and vomiting.  Neurological: Negative for dizziness and light-headedness.   PHYSICAL EXAM: Vitals with BMI 04/23/2020 04/23/2020 04/19/2020  Height 5\' 4"  5\' 4"  5\' 4"   Weight 137 lbs 136 lbs 140 lbs  BMI 23.5 49.67 59.16  Systolic 384 665 993  Diastolic 56 64 70  Pulse 84 87 72   CONSTITUTIONAL: Appears older than stated age, hemodynamically stable, no acute distress.    SKIN: Skin is warm and dry. No rash noted. No cyanosis. No pallor. No jaundice HEAD: Normocephalic and atraumatic.  EYES: No scleral icterus MOUTH/THROAT: Moist oral membranes.  NECK: No JVD present. No thyromegaly noted.   LYMPHATIC: No visible cervical adenopathy.  CHEST Normal respiratory effort. No intercostal retractions.  BiV ICD noted left infraclavicular region. LUNGS: Clear to auscultation bilaterally.  No stridor. No wheezes. No rales.  CARDIOVASCULAR: Regular, positive T7-S1, soft holosystolic murmur heard at the apex radiating to axilla, no gallops or rubs appreciated ABDOMINAL: Nonobese, soft, nontender,  nondistended, positive bowel sounds in all 4 quadrants no apparent ascites.  EXTREMITIES: No bilateral peripheral edema. 2+ right femoral pulse, 1+ left femoral pulse, none palpable bilateral popliteal, dorsalis pedis or posterior tibial pulses.  Warm to touch bilaterally.  No discoloration or cyanosis present. HEMATOLOGIC: No significant bruising NEUROLOGIC: Oriented to person, place, and time. Nonfocal. Normal muscle tone.  PSYCHIATRIC: Normal mood and affect. Normal behavior. Cooperative  CARDIAC DATABASE: EKG: 08/12/2019: Normal sinus rhythm with ventricular rate of 61 bpm, left axis deviation, left bundle branch block, nonspecific T wave abnormalities.  Prior EKG dated 08/04/2019 shows a normal sinus  rhythm, left axis deviation, left bundle branch block. 12/08/2019: Atrial paced,  Ventricular sensed rhythm.  Echocardiogram: 02/28/2020:  Left ventricle cavity is normal in size. Moderate concentric hypertrophy of the left ventricle. Severe global hypokinesis with abnormal septal wall motion due to left bundle branch block. LVEF 30-35%. Doppler evidence of  grade I (impaired) diastolic dysfunction, normal LAP. Calculated EF 30%.  Trileaflet aortic valve. Trace aortic regurgitation.  Moderate to severe mitral regurgitation.  Mild tricuspid regurgitation. Estimated pulmonary artery systolic pressure 28 mmHg.  Compared to previous studies in 07/2019, LVEF is marginally improved from  25-30%, but mitral regurgitation has worsened from mild.   Heart Catheterization: 08/04/19:  LV: Global hypokinesis, upper limit of normal size, EF 25 to 30%. Left main: Normal. LAD: Mild diffuse disease.  Proximal LAD has a 30 to 40% stenosis, mid segment has a 20 to 30% stenosis, scattered disease noted in the LAD.  Brisk flow. Circumflex: Again scattered disease noted in the circumflex.  Mid segment has at most a 30 to 40% stenosis which appears to be eccentric and calcified. RCA: Dominant.  Mild diffuse disease  again noted.  Mid segment after the origin of RV branch has a 70 to 80% stenosis.  Brisk flow is evident throughout the RCA. Impression: Findings consistent with nonischemic cardiomyopathy.  Although she has significant disease in the right coronary artery, this does not explain her presentation with global hypokinesis, neither does it explain VF arrest as the lesion does not appear to be unstable.   Biventricular ICD implantation 08/25/2019: ICD BiV Medtronic model [id number] Claria Quad CRT-D SureScan ICD  Carotid duplex: 08/03/2019: Right Carotid: Velocities in the right ICA are consistent with a 1-39% stenosis. Left Carotid: Velocities in the left ICA are consistent with a 1-39%  stenosis. Vertebrals: Bilateral vertebral arteries demonstrate antegrade flow. Subclavians: Normal flow hemodynamics were seen in bilateral subclavian arteries.  LABORATORY DATA: CBC Latest Ref Rng & Units 04/23/2020 04/14/2020 04/14/2020  WBC 4.0 - 10.5 K/uL 5.4 - 6.5  Hemoglobin 12.0 - 15.0 g/dL 10.7(L) 10.9(L) 10.5(L)  Hematocrit 36 - 46 % 32.8(L) 32.0(L) 34.3(L)  Platelets 150 - 400 K/uL 223.0 - 196    CMP Latest Ref Rng & Units 04/23/2020 04/16/2020 04/15/2020  Glucose 70 - 99 mg/dL 117(H) 99 98  BUN 6 - 23 mg/dL 32(H) 22 21  Creatinine 0.40 - 1.20 mg/dL 3.27(H) 3.14(H) 3.16(H)  Sodium 135 - 145 mEq/L 137 139 141  Potassium 3.5 - 5.1 mEq/L 4.0 3.4(L) 3.7  Chloride 96 - 112 mEq/L 99 99 101  CO2 19 - 32 mEq/L 30 27 29   Calcium 8.4 - 10.5 mg/dL 8.9 9.2 9.1  Total Protein 6.5 - 8.1 g/dL - - -  Total Bilirubin 0.3 - 1.2 mg/dL - - -  Alkaline Phos 38 - 126 U/L - - -  AST 15 - 41 U/L - - -  ALT 0 - 44 U/L - - -    Lipid Panel     Component Value Date/Time   CHOL 114 09/21/2019 1416   TRIG 147 09/21/2019 1416   HDL 33 (L) 09/21/2019 1416   CHOLHDL 7.5 08/03/2019 0249   VLDL 26 08/03/2019 0249   LDLCALC 55 09/21/2019 1416   LDLDIRECT 193.0 09/02/2016 0839   LABVLDL 26 09/21/2019 1416    Lab Results   Component Value Date   HGBA1C 5.7 (H) 08/03/2019   No components found for: NTPROBNP Lab Results  Component Value Date   TSH 1.487 12/07/2019   TSH  0.550 08/24/2019   TSH 0.75 08/23/2019    FINAL MEDICATION LIST END OF ENCOUNTER:   Current Outpatient Medications:  .  apixaban (ELIQUIS) 5 MG TABS tablet, Take 1 tablet (5 mg total) by mouth 2 (two) times daily., Disp: 180 tablet, Rfl: 3 .  aspirin 81 MG chewable tablet, Chew 1 tablet (81 mg total) by mouth daily., Disp: 30 tablet, Rfl: 0 .  carvedilol (COREG) 12.5 MG tablet, TAKE 1 TABLET (12.5 MG TOTAL) BY MOUTH 2 (TWO) TIMES DAILY WITH A MEAL. (Patient taking differently: Take 6.25 mg by mouth at bedtime. ), Disp: 180 tablet, Rfl: 1 .  Cholecalciferol (VITAMIN D3 PO), Take 1,000 Units by mouth in the morning and at bedtime., Disp: , Rfl:  .  Cyanocobalamin (B12 FAST DISSOLVE PO), Take 1 tablet by mouth daily. , Disp: , Rfl:  .  docusate sodium (COLACE) 100 MG capsule, Take 1 capsule (100 mg total) by mouth daily as needed for mild constipation., Disp: 30 capsule, Rfl: 0 .  ezetimibe (ZETIA) 10 MG tablet, TAKE 1 TABLET BY MOUTH EVERY DAY (Patient taking differently: Take 10 mg by mouth daily. ), Disp: 90 tablet, Rfl: 1 .  Famotidine (PEPCID PO), Take 20 mg by mouth in the morning and at bedtime. , Disp: , Rfl:  .  Magnesium 400 MG TABS, Take 400 mg by mouth 2 (two) times daily. , Disp: , Rfl:  .  nitroGLYCERIN (NITROSTAT) 0.4 MG SL tablet, Place 1 tablet (0.4 mg total) under the tongue every 5 (five) minutes as needed for chest pain., Disp: 30 tablet, Rfl: 3 .  ondansetron (ZOFRAN ODT) 4 MG disintegrating tablet, Take 1 tablet (4 mg total) by mouth every 8 (eight) hours as needed for nausea or vomiting., Disp: 20 tablet, Rfl: 1 .  sertraline (ZOLOFT) 50 MG tablet, Take 1 tablet (50 mg total) by mouth at bedtime. To start medication- take 1/2 tablet at bedtime for 7 days then increase to 1 tablet at bedtime. (Patient taking differently:  Take 50 mg by mouth at bedtime. ), Disp: 90 tablet, Rfl: 3 .  torsemide (DEMADEX) 5 MG tablet, Take 1 tablet (5 mg total) by mouth every Monday, Wednesday, and Friday., Disp: 30 tablet, Rfl: 3 .  acetaminophen (TYLENOL) 500 MG tablet, Take 1,000 mg by mouth 2 (two) times daily as needed (pain)., Disp: , Rfl:  .  rosuvastatin (CRESTOR) 10 MG tablet, TAKE 1 TABLET (10 MG TOTAL) BY MOUTH DAILY AT 6 PM., Disp: 90 tablet, Rfl: 1  IMPRESSION:    ICD-10-CM   1. Chronic HFrEF (heart failure with reduced ejection fraction) (HCC)  I50.22 torsemide (DEMADEX) 5 MG tablet    Magnesium    Brain natriuretic peptide    Basic metabolic panel  2. Cardiomyopathy  I42.9   3. Essential hypertension  I10 torsemide (DEMADEX) 5 MG tablet    Brain natriuretic peptide    Basic metabolic panel  4. Paroxysmal atrial fibrillation (HCC)  I48.0   5. Long term current use of anticoagulant  Z79.01   6. Cardiac arrest with ventricular fibrillation (HCC)  I46.9    I49.01   7. Biventricular ICD (implantable cardioverter-defibrillator) in place  Z95.810   8. History of torsades de pointes  Z86.79   9. Left bundle branch block  I44.7   10. Mixed hyperlipidemia  E78.2   11. History of CVA (cerebrovascular accident)  Z60.73   12. Former smoker  Z87.891      RECOMMENDATIONS: ELLINGTON CORNIA is a 69 y.o.  female whose past medical history and cardiac risk factors include: Hypertension, hyperlipidemia, former smoker, history of Arnold-Chiari malformation, history of VT/VF, history of torsades, cardiac arrest, nonischemic cardiomyopathy, peripheral vascular disease, paroxysmal atrial fibrillation, left bundle branch block, history of CVA.  Chronic heart failure with reduced ejection fraction, stage C, NYHA class II:  Continue current medical therapy.  Unable to be on ARB's, Arni, Aldactone secondary to worsening renal function.  Patient was also on hydralazine/Isordil combination and was not able to tolerate it secondary to  feeling tired, fatigued.  Informed both the patient and her daughter that her blood pressures can tolerate up titration of medical therapy.  Currently taking torsemide 5 mg p.o. daily.  Given the fact that her serum creatinine continues to uptrend recommending checking weights on a daily basis and taking torsemide 5 mg p.o. Monday Wednesday and Friday.  Patient is asked to use torsemide on a regular basis if she starts gaining 1 pound 24 hours or 3 pounds in a week.  Also recommended that she follows up with her nephrology prior to her scheduled visit to see which diuretic therapy is recommended given her recent worsening of kidney function.  Strict I's and O's, and daily weights recommended.  Fluid restriction to less than 2 L/day, sodium restriction to less than 1.5 g/day.  Cardiomyopathy, suggestive of nonischemic: See above  Status post BiV ICD status post ventricular fibrillation arrest: Patient  Medtronic model [id number] Claria Quad CRT-D SureScan (serial Number [id number] )   Last device check reviewed with patient and daughter.   Left bundle branch block: Continue to monitor.  Paroxysmal atrial fibrillation:  Rate control: Coreg.    Rhythm control: None.  Thromboembolic prophylaxis currently on Eliquis.  Patient does not endorse any evidence of bleeding.  CHA2DS2VASc score: 7. Annual stroke risk: 9.6% (congestive heart failure, hypertension, age, history of stroke, medical history of peripheral vascular disease, and gender)  Long-term oral anticoagulation use:  Indication paroxysmal atrial fibrillation.  Patient did not endorse any evidence of bleeding  Mixed hyperlipidemia: Currently not at goal.  Currently on Crestor and Zetia.  We will continue to monitor.  Patient does not endorse any evidence of myalgias.  History of CVA: Educated on the importance of secondary prevention.  Will defer further management to primary and neurology.  Former smoker: Educated on the  importance of continued smoking cessation.  No orders of the defined types were placed in this encounter.  --Continue cardiac medications as reconciled in final medication list. --Return in about 3 months (around 07/24/2020) for heart failure management... Or sooner if needed. --Continue follow-up with your primary care physician regarding the management of your other chronic comorbid conditions.  Patient's questions and concerns were addressed to her and her daughter's satisfaction. She and her daughter voices understanding of the instructions provided during this encounter.   This note was created using a voice recognition software as a result there may be grammatical errors inadvertently enclosed that do not reflect the nature of this encounter. Every attempt is made to correct such errors.  Total time spent: 30 minutes.   [name], [place], [facility]  Pager: [phone] Office: [phone]

## 2020-04-24 ENCOUNTER — Other Ambulatory Visit: Payer: Medicare HMO

## 2020-04-24 DIAGNOSIS — K219 Gastro-esophageal reflux disease without esophagitis: Secondary | ICD-10-CM

## 2020-04-24 DIAGNOSIS — R197 Diarrhea, unspecified: Secondary | ICD-10-CM | POA: Diagnosis not present

## 2020-04-24 DIAGNOSIS — R1310 Dysphagia, unspecified: Secondary | ICD-10-CM

## 2020-04-27 ENCOUNTER — Telehealth: Payer: Self-pay | Admitting: General Surgery

## 2020-04-27 ENCOUNTER — Other Ambulatory Visit: Payer: Self-pay | Admitting: Nurse Practitioner

## 2020-04-27 LAB — GI PROFILE, STOOL, PCR

## 2020-04-27 MED ORDER — VANCOMYCIN HCL 125 MG PO CAPS: 125.0000 mg | ORAL_CAPSULE | Freq: Four times a day (QID) | ORAL | 0 refills | Status: AC

## 2020-04-27 NOTE — Telephone Encounter (Signed)
I called the patient's daughter and discussed the C. difficile positive result.  Her mother is taking Florastor 1 p.o. twice daily as recommended at the time of her office appointment.  She continues to have some diarrhea therefore I sent a prescription for vancomycin 125 mg 1 p.o. 4 times daily for 14 days.  Patient has a follow-up appointment in the office in next week.  Further recommendations will be determined at that time.  Daughter to call our office if other symptoms worsen.

## 2020-04-27 NOTE — Telephone Encounter (Signed)
Spoke with Talma at [facility]. The patient has a positive c. Diff A/B

## 2020-05-03 ENCOUNTER — Ambulatory Visit: Payer: Medicare HMO | Admitting: Nurse Practitioner

## 2020-05-03 ENCOUNTER — Encounter: Payer: Self-pay | Admitting: Nurse Practitioner

## 2020-05-03 VITALS — BP 136/82 | HR 74 | Ht 64.0 in | Wt 137.0 lb

## 2020-05-03 DIAGNOSIS — A498 Other bacterial infections of unspecified site: Secondary | ICD-10-CM | POA: Diagnosis not present

## 2020-05-03 NOTE — Progress Notes (Signed)
05/03/2020 [name] [id number] [birth year]   Chief Complaint: C. Diff follow up  History of Present Illness: [name] is a 69 year old female with a past medical history of CKD, hypertension, Arnold-Chiari malformation, GERD and colon polyps. She was admitted to the hospital with a basal ganglia stroke on 01/15/6414 complicated by VF/VT/torsades cardiac arrest 2/18/202. She was intubated in the ICU then extubated within 24 hours. She developed a new LBBB, ECHO showed dilated cardiomyopathy with EF 25 -30%, s/p cardiac cath 70-80% RCA lesion. She developed atrial fibrillation and she was started on Eliquis and Carvedilol. She was eventually discharged home on 08/07/2019 with a life vest. She was readmitted to the hospital with a TIA per neurology, discharged home 08/15/2019 on Eliquis and ASA. Her life vest went off and she was readmitted to the hospital 08/24/2019 and she underwent placement of a biventricular AICD 08/25/2019.    I saw [name] in the office on 04/19/2020, refer to that consult note for extensive history review.  She was evaluated at that time due to having explosive diarrhea up to 13 times daily.  A GI pathogen panel was positive for C. difficile infection.  She was prescribed Vancomycin 125 mg 1 p.o. 4 times daily for 14 days which she started on 04/27/2020.  She is taking Florastor probiotic 1 p.o. twice daily.  She presents today for further follow-up.  Her diarrhea significantly improved within 3 to 4 days of taking the vancomycin.  Her bowel frequency has significantly reduced.  She is passing 2-3 soft light brown stools daily.  No rectal bleeding.  No further right upper quadrant abdominal pain. No other abdominal pain.  Her appetite has increased.  She continues to have dysphagia described as food sometimes gets stuck briefly in the upper esophagus, no episodes since her last office appointment.  I discussed eventually scheduling a barium swallow but will defer this  for now as she is recovering from C. difficile infection.   Current Outpatient Medications on File Prior to Visit  Medication Sig Dispense Refill  . acetaminophen (TYLENOL) 500 MG tablet Take 1,000 mg by mouth 2 (two) times daily as needed (pain).    [name] apixaban (ELIQUIS) 5 MG TABS tablet Take 1 tablet (5 mg total) by mouth 2 (two) times daily. 180 tablet 3  . aspirin 81 MG chewable tablet Chew 1 tablet (81 mg total) by mouth daily. 30 tablet 0  . carvedilol (COREG) 12.5 MG tablet TAKE 1 TABLET (12.5 MG TOTAL) BY MOUTH 2 (TWO) TIMES DAILY WITH A MEAL. (Patient taking differently: Take 6.25 mg by mouth at bedtime. ) 180 tablet 1  . Cholecalciferol (VITAMIN D3 PO) Take 1,000 Units by mouth in the morning and at bedtime.    [name] ezetimibe (ZETIA) 10 MG tablet TAKE 1 TABLET BY MOUTH EVERY DAY (Patient taking differently: Take 10 mg by mouth daily. ) 90 tablet 1  . Famotidine (PEPCID PO) Take 20 mg by mouth in the morning and at bedtime.     . Magnesium 400 MG TABS Take 400 mg by mouth 2 (two) times daily.     . nitroGLYCERIN (NITROSTAT) 0.4 MG SL tablet Place 1 tablet (0.4 mg total) under the tongue every 5 (five) minutes as needed for chest pain. 30 tablet 3  . sertraline (ZOLOFT) 50 MG tablet Take 1 tablet (50 mg total) by mouth at bedtime. To start medication- take 1/2 tablet at bedtime for 7 days then increase to 1  tablet at bedtime. (Patient taking differently: Take 50 mg by mouth at bedtime. ) 90 tablet 3  . torsemide (DEMADEX) 5 MG tablet Take 1 tablet (5 mg total) by mouth every Monday, Wednesday, and Friday. 30 tablet 3  . vancomycin (VANCOCIN HCL) 125 MG capsule Take 1 capsule (125 mg total) by mouth 4 (four) times daily for 14 days. 56 capsule 0  . ondansetron (ZOFRAN ODT) 4 MG disintegrating tablet Take 1 tablet (4 mg total) by mouth every 8 (eight) hours as needed for nausea or vomiting. (Patient not taking: Reported on 05/03/2020) 20 tablet 1  . rosuvastatin (CRESTOR) 10 MG tablet TAKE 1  TABLET (10 MG TOTAL) BY MOUTH DAILY AT 6 PM. 90 tablet 1   No current facility-administered medications on file prior to visit.   Allergies  Allergen Reactions  . Shellfish-Derived Products Anaphylaxis  . Ativan [Lorazepam] Other (See Comments)    Makes "skin crawl" and insomnia  . Buspar [Buspirone] Nausea Only    Sweating and dizzy  . Clarithromycin Swelling  . Codeine Nausea And Vomiting  . Doxycycline Other (See Comments)    Unknown  . Guaifenesin Other (See Comments)    Palpitations  . Oxycodone Nausea And Vomiting  . Prednisone Nausea And Vomiting and Other (See Comments)    Makes my heart race  . Statins Other (See Comments)    Muscle pain  . Sulfonamide Derivatives Nausea And Vomiting    Achiness  . Tetracycline Nausea Only  . Vicodin [Hydrocodone-Acetaminophen] Nausea And Vomiting  . Famotidine Rash  . Latex Rash  . Omeprazole Nausea Only    Cough, shortness of breath - patient doesn't remember  . Peanut-Containing Drug Products Itching and Rash  . Protonix [Pantoprazole] Rash  . Wellbutrin [Bupropion] Palpitations      Current Medications, Allergies, Past Medical History, Past Surgical History, Family History and Social History were reviewed in [employer] record.   Review of Systems:   Constitutional: Negative for fever, sweats, chills or weight loss.  Respiratory: Negative for shortness of breath.   Cardiovascular: Negative for chest pain, palpitations and leg swelling.  Gastrointestinal: See HPI.  Musculoskeletal: Negative for back pain or muscle aches.  Neurological: Negative for dizziness, headaches or paresthesias.    Physical Exam: BP 136/82   Pulse 74   Ht 5\' 4"  (1.626 m)   Wt 137 lb (62.1 kg)   SpO2 97%   BMI 23.52 kg/m   Wt Readings from Last 3 Encounters:  05/03/20 137 lb (62.1 kg)  04/23/20 137 lb (62.1 kg)  04/23/20 136 lb (61.7 kg)   General: 69 year old female female in no acute distress. Head: Normocephalic  and atraumatic. Eyes: No scleral icterus. Conjunctiva pink . Ears: Normal auditory acuity. Lungs: Clear throughout to auscultation. Heart: Regular rate and rhythm, no murmur. Abdomen: Soft, nontender and nondistended. No masses or hepatomegaly. Normal bowel sounds x 4 quadrants.  Rectal: Deferred. Musculoskeletal: Symmetrical with no gross deformities. Extremities: No edema. Neurological: Alert oriented x 4. No focal deficits.  Psychological: Alert and cooperative. Normal mood and affect  Assessment and Recommendations:  1.  C.  Difficile diarrhea -Continue Vancomycin 125 mg 1 p.o. 4 times daily to complete the previously prescribed 14-day course -Continue Florastor probiotic 1 p.o. twice daily for the next 4 to 6 weeks -Patient to call our office if she develops looser or more frequent stools after she completes the 14-day course of vancomycin (she may require additional vancomycin with a tapering dose if these  symptoms recur) -Diet as tolerated  2.  Dysphagia -Discussed scheduling a barium swallow after C. difficile infection completely resolved -Patient will follow up in the office with Dr. Silverio Decamp in 6 weeks and further dysphagia evaluation to be determined at that time -Patient to call our office if her dysphagia symptoms worsen prior to her follow-up appointment  3.  History of colon polyps History of numerous tubular adenomatous and hyperplastic polyps per colonoscopy 06/2015.  Recall colonoscopy was due 06/2018 but was not completed. -Defer surveillance colonoscopy for now due to multiple comorbidities

## 2020-05-03 NOTE — Patient Instructions (Signed)
If you are age 69 or older, your body mass index should be between 23-30. Your Body mass index is 23.52 kg/m. If this is out of the aforementioned range listed, please consider follow up with your Primary Care Provider.  If you are age 69 or younger, your body mass index should be between 19-25. Your Body mass index is 23.52 kg/m. If this is out of the aformentioned range listed, please consider follow up with your Primary Care Provider.   Continue Vancomycin.  Continue Florastor.   Call with any recurrent symptoms.  Follow up with Dr. Silverio Decamp in 6 weeks

## 2020-05-08 DIAGNOSIS — N184 Chronic kidney disease, stage 4 (severe): Secondary | ICD-10-CM | POA: Diagnosis not present

## 2020-05-08 DIAGNOSIS — I509 Heart failure, unspecified: Secondary | ICD-10-CM | POA: Diagnosis not present

## 2020-05-08 DIAGNOSIS — I129 Hypertensive chronic kidney disease with stage 1 through stage 4 chronic kidney disease, or unspecified chronic kidney disease: Secondary | ICD-10-CM | POA: Diagnosis not present

## 2020-05-15 ENCOUNTER — Other Ambulatory Visit: Payer: Self-pay | Admitting: Family Medicine

## 2020-05-15 NOTE — Progress Notes (Signed)
Reviewed and agree with documentation and assessment and plan. K. Veena Dracen Reigle , MD   

## 2020-05-22 NOTE — Progress Notes (Signed)
Reviewed and agree with documentation and assessment and plan. K. Veena Shakala Marlatt , MD   

## 2020-05-23 DIAGNOSIS — R69 Illness, unspecified: Secondary | ICD-10-CM | POA: Diagnosis not present

## 2020-05-25 DIAGNOSIS — I4901 Ventricular fibrillation: Secondary | ICD-10-CM | POA: Diagnosis not present

## 2020-05-25 DIAGNOSIS — Z95810 Presence of automatic (implantable) cardiac defibrillator: Secondary | ICD-10-CM | POA: Diagnosis not present

## 2020-05-25 DIAGNOSIS — I5022 Chronic systolic (congestive) heart failure: Secondary | ICD-10-CM | POA: Diagnosis not present

## 2020-05-25 DIAGNOSIS — Z4502 Encounter for adjustment and management of automatic implantable cardiac defibrillator: Secondary | ICD-10-CM | POA: Diagnosis not present

## 2020-05-28 ENCOUNTER — Encounter: Payer: Self-pay | Admitting: Internal Medicine

## 2020-05-28 ENCOUNTER — Ambulatory Visit (INDEPENDENT_AMBULATORY_CARE_PROVIDER_SITE_OTHER): Payer: Medicare HMO | Admitting: Internal Medicine

## 2020-05-28 ENCOUNTER — Other Ambulatory Visit: Payer: Self-pay

## 2020-05-28 VITALS — BP 110/70 | HR 76 | Temp 97.7°F | Ht 64.0 in | Wt 137.0 lb

## 2020-05-28 DIAGNOSIS — S60221A Contusion of right hand, initial encounter: Secondary | ICD-10-CM | POA: Diagnosis not present

## 2020-05-28 NOTE — Assessment & Plan Note (Signed)
Very small with some associated ecchymoses Reassured her--not a "blood clot" like in a vein that could move This should resolve on its own but may take weeks Probably too late for any more ice

## 2020-05-28 NOTE — Progress Notes (Signed)
Subjective:    Patient ID: [name], female    DOB: 01-17-1951, 69 y.o.   MRN: [id number]  HPI Here due to a knot on her right wrist This visit occurred during the SARS-CoV-2 public health emergency.  Safety protocols were in place, including screening questions prior to the visit, additional usage of staff PPE, and extensive cleaning of exam room while observing appropriate contact time as indicated for disinfecting solutions.   Hit her hand on Friday against chair Was real big and red---- she iced it and it is some better Did have some bruise--but then got swelling on top the preceding bruise  No pain  Current Outpatient Medications on File Prior to Visit  Medication Sig Dispense Refill  . acetaminophen (TYLENOL) 500 MG tablet Take 1,000 mg by mouth 2 (two) times daily as needed (pain).    [name] apixaban (ELIQUIS) 5 MG TABS tablet Take 1 tablet (5 mg total) by mouth 2 (two) times daily. 180 tablet 3  . aspirin 81 MG chewable tablet Chew 1 tablet (81 mg total) by mouth daily. 30 tablet 0  . carvedilol (COREG) 12.5 MG tablet TAKE 1 TABLET (12.5 MG TOTAL) BY MOUTH 2 (TWO) TIMES DAILY WITH A MEAL. (Patient taking differently: Take 6.25 mg by mouth at bedtime.) 180 tablet 1  . Cholecalciferol (VITAMIN D3 PO) Take 1,000 Units by mouth in the morning and at bedtime.    [name] ezetimibe (ZETIA) 10 MG tablet TAKE 1 TABLET BY MOUTH EVERY DAY (Patient taking differently: Take 10 mg by mouth daily.) 90 tablet 1  . Famotidine (PEPCID PO) Take 20 mg by mouth in the morning and at bedtime.     . Magnesium 400 MG TABS Take 400 mg by mouth 2 (two) times daily.     . nitroGLYCERIN (NITROSTAT) 0.4 MG SL tablet Place 1 tablet (0.4 mg total) under the tongue every 5 (five) minutes as needed for chest pain. 30 tablet 3  . ondansetron (ZOFRAN ODT) 4 MG disintegrating tablet Take 1 tablet (4 mg total) by mouth every 8 (eight) hours as needed for nausea or vomiting. 20 tablet 1  . sertraline (ZOLOFT) 50 MG tablet  Take 1 tablet (50 mg total) by mouth at bedtime. To start medication- take 1/2 tablet at bedtime for 7 days then increase to 1 tablet at bedtime. (Patient taking differently: Take 50 mg by mouth at bedtime.) 90 tablet 3  . torsemide (DEMADEX) 5 MG tablet Take 1 tablet (5 mg total) by mouth every Monday, Wednesday, and Friday. 30 tablet 3  . rosuvastatin (CRESTOR) 10 MG tablet TAKE 1 TABLET (10 MG TOTAL) BY MOUTH DAILY AT 6 PM. 90 tablet 1   No current facility-administered medications on file prior to visit.    Allergies  Allergen Reactions  . Shellfish-Derived Products Anaphylaxis  . Ativan [Lorazepam] Other (See Comments)    Makes "skin crawl" and insomnia  . Buspar [Buspirone] Nausea Only    Sweating and dizzy  . Clarithromycin Swelling  . Codeine Nausea And Vomiting  . Doxycycline Other (See Comments)    Unknown  . Guaifenesin Other (See Comments)    Palpitations  . Oxycodone Nausea And Vomiting  . Prednisone Nausea And Vomiting and Other (See Comments)    Makes my heart race  . Statins Other (See Comments)    Muscle pain  . Sulfonamide Derivatives Nausea And Vomiting    Achiness  . Tetracycline Nausea Only  . Vicodin [Hydrocodone-Acetaminophen] Nausea And Vomiting  . Famotidine  Rash  . Latex Rash  . Omeprazole Nausea Only    Cough, shortness of breath - patient doesn't remember  . Peanut-Containing Drug Products Itching and Rash  . Protonix [Pantoprazole] Rash  . Wellbutrin [Bupropion] Palpitations    Past Medical History:  Diagnosis Date  . Allergy   . Anxiety   . Arnold-Chiari malformation ([place])   . Arthritis   . CHF (congestive heart failure) ([place])   . Chronic systolic heart failure ([place]) 11/03/2019  . Coronary artery disease   . Encounter for assessment of implantable cardioverter-defibrillator (ICD) 11/10/2019  . GERD (gastroesophageal reflux disease)   . H. pylori infection    3 years ago  . H/O cardiac arrest    Hx of VT/Vfib arrest  . Hyperlipidemia    . ICD BiV Medtronic model [id number] [place] Quad CRT-D SureScan ICD 08/25/2019   . Incontinence   . Paroxysmal atrial fibrillation ([place]) 08/06/2019  . Pulmonary edema 2021  . Syncope and collapse    Per pt, denies passing out  . Tobacco use disorder   . Unspecified essential hypertension     Past Surgical History:  Procedure Laterality Date  . APPENDECTOMY    . ARNOLD CHIARI SURGERY     neurocranial surgery  . BIV ICD INSERTION CRT-D N/A 08/25/2019   Procedure: BIV ICD INSERTION CRT-D;  Surgeon: Constance Haw, MD;  Location: [facility];  Service: Cardiovascular;  Laterality: N/A;  . BLEPHAROPLASTY     [place]  . C-EYE SURGERY PROCEDURE    . CARDIAC CATHETERIZATION    . CHOLECYSTECTOMY    . EYE SURGERY    . heart failure    . LEFT HEART CATH AND CORONARY ANGIOGRAPHY N/A 08/04/2019   Procedure: LEFT HEART CATH AND CORONARY ANGIOGRAPHY;  Surgeon: Adrian Prows, MD;  Location: [facility];  Service: Cardiovascular;  Laterality: N/A;  . MASS EXCISION Right 12/27/2013   Procedure: MINOR EXCISION OF RIGHT THUMB MUCOID CYST, DEBRIDEMENT OF INTERPHALANGEAL JOINT;  Surgeon: Cammie Sickle, MD;  Location: [facility];  Service: Orthopedics;  Laterality: Right;  . mass on thumb  right  . TOTAL KNEE ARTHROPLASTY Right 02/17/2017  . TOTAL KNEE ARTHROPLASTY Right 02/17/2017   Procedure: TOTAL KNEE ARTHROPLASTY;  Surgeon: Melrose Nakayama, MD;  Location: [facility];  Service: Orthopedics;  Laterality: Right;  . TUBAL LIGATION      Family History  Problem Relation Age of Onset  . Bone cancer Mother   . Heart disease Mother   . Cancer Sister        metastatic; unknown primary  . Diverticulosis Sister   . Tuberculosis Father   . Diabetes Sister   . Hypertension Sister   . Colon cancer Neg Hx   . Esophageal cancer Neg Hx   . Pancreatic cancer Neg Hx   . Stomach cancer Neg Hx   . Liver disease Neg Hx   . Kidney disease Neg Hx   . Rectal cancer Neg Hx   . Pancreatic  disease Neg Hx     Social History   Socioeconomic History  . Marital status: Married    Spouse name: Not on file  . Number of children: 2  . Years of education: Not on file  . Highest education level: Not on file  Occupational History  . Occupation: hair dresser  Tobacco Use  . Smoking status: Former Smoker    Packs/day: 1.00    Years: 50.00    Pack years: 50.00  Types: Cigarettes    Quit date: 08/08/2019    Years since quitting: 0.8  . Smokeless tobacco: Never Used  Vaping Use  . Vaping Use: Never used  Substance and Sexual Activity  . Alcohol use: No    Alcohol/week: 0.0 standard drinks  . Drug use: No  . Sexual activity: Not on file  Other Topics Concern  . Not on file  Social History Narrative  . Not on file   Social Determinants of Health   Financial Resource Strain: Not on file  Food Insecurity: Not on file  Transportation Needs: Not on file  Physical Activity: Not on file  Stress: Not on file  Social Connections: Not on file  Intimate Partner Violence: Not on file   Review of Systems  No limitations of movement in hand and wrist Some bruising up arm as well     Objective:   Physical Exam Musculoskeletal:     Comments: Bruising in dorsum of right hand and across to wrist Has hard lump distal to wrist (~6-78mm diameter) which is moveable and not tender Slight whitish spot appears to be (1-57mm) where skin was minimally abraded (overlying the mass)            Assessment & Plan:

## 2020-05-31 DIAGNOSIS — N184 Chronic kidney disease, stage 4 (severe): Secondary | ICD-10-CM | POA: Diagnosis not present

## 2020-05-31 DIAGNOSIS — I129 Hypertensive chronic kidney disease with stage 1 through stage 4 chronic kidney disease, or unspecified chronic kidney disease: Secondary | ICD-10-CM | POA: Diagnosis not present

## 2020-05-31 DIAGNOSIS — I509 Heart failure, unspecified: Secondary | ICD-10-CM | POA: Diagnosis not present

## 2020-06-12 ENCOUNTER — Observation Stay (HOSPITAL_COMMUNITY)
Admission: EM | Admit: 2020-06-12 | Discharge: 2020-06-13 | Disposition: A | Discharge status: Home / Self Care | Payer: Medicare HMO | Attending: Internal Medicine | Admitting: Internal Medicine

## 2020-06-12 ENCOUNTER — Encounter (HOSPITAL_COMMUNITY): Payer: Self-pay | Admitting: Internal Medicine

## 2020-06-12 ENCOUNTER — Emergency Department (HOSPITAL_COMMUNITY): Payer: Medicare HMO

## 2020-06-12 ENCOUNTER — Other Ambulatory Visit: Payer: Self-pay

## 2020-06-12 DIAGNOSIS — I517 Cardiomegaly: Secondary | ICD-10-CM | POA: Diagnosis not present

## 2020-06-12 DIAGNOSIS — N184 Chronic kidney disease, stage 4 (severe): Secondary | ICD-10-CM | POA: Diagnosis not present

## 2020-06-12 DIAGNOSIS — R0902 Hypoxemia: Secondary | ICD-10-CM

## 2020-06-12 DIAGNOSIS — N289 Disorder of kidney and ureter, unspecified: Secondary | ICD-10-CM

## 2020-06-12 DIAGNOSIS — Z95810 Presence of automatic (implantable) cardiac defibrillator: Secondary | ICD-10-CM

## 2020-06-12 DIAGNOSIS — E782 Mixed hyperlipidemia: Secondary | ICD-10-CM

## 2020-06-12 DIAGNOSIS — Z87891 Personal history of nicotine dependence: Secondary | ICD-10-CM | POA: Diagnosis not present

## 2020-06-12 DIAGNOSIS — J90 Pleural effusion, not elsewhere classified: Secondary | ICD-10-CM | POA: Diagnosis not present

## 2020-06-12 DIAGNOSIS — Z91040 Latex allergy status: Secondary | ICD-10-CM | POA: Insufficient documentation

## 2020-06-12 DIAGNOSIS — D649 Anemia, unspecified: Secondary | ICD-10-CM | POA: Diagnosis not present

## 2020-06-12 DIAGNOSIS — I5043 Acute on chronic combined systolic (congestive) and diastolic (congestive) heart failure: Secondary | ICD-10-CM | POA: Diagnosis not present

## 2020-06-12 DIAGNOSIS — I480 Paroxysmal atrial fibrillation: Secondary | ICD-10-CM | POA: Diagnosis not present

## 2020-06-12 DIAGNOSIS — R0600 Dyspnea, unspecified: Secondary | ICD-10-CM | POA: Diagnosis not present

## 2020-06-12 DIAGNOSIS — I130 Hypertensive heart and chronic kidney disease with heart failure and stage 1 through stage 4 chronic kidney disease, or unspecified chronic kidney disease: Secondary | ICD-10-CM | POA: Insufficient documentation

## 2020-06-12 DIAGNOSIS — R069 Unspecified abnormalities of breathing: Secondary | ICD-10-CM | POA: Diagnosis not present

## 2020-06-12 DIAGNOSIS — Z8673 Personal history of transient ischemic attack (TIA), and cerebral infarction without residual deficits: Secondary | ICD-10-CM | POA: Diagnosis not present

## 2020-06-12 DIAGNOSIS — Z20822 Contact with and (suspected) exposure to covid-19: Secondary | ICD-10-CM | POA: Diagnosis not present

## 2020-06-12 DIAGNOSIS — R778 Other specified abnormalities of plasma proteins: Secondary | ICD-10-CM

## 2020-06-12 DIAGNOSIS — R0602 Shortness of breath: Secondary | ICD-10-CM

## 2020-06-12 DIAGNOSIS — Z66 Do not resuscitate: Secondary | ICD-10-CM

## 2020-06-12 DIAGNOSIS — R062 Wheezing: Secondary | ICD-10-CM | POA: Diagnosis not present

## 2020-06-12 DIAGNOSIS — J9621 Acute and chronic respiratory failure with hypoxia: Secondary | ICD-10-CM

## 2020-06-12 DIAGNOSIS — Z7982 Long term (current) use of aspirin: Secondary | ICD-10-CM | POA: Insufficient documentation

## 2020-06-12 DIAGNOSIS — I5042 Chronic combined systolic (congestive) and diastolic (congestive) heart failure: Principal | ICD-10-CM | POA: Insufficient documentation

## 2020-06-12 DIAGNOSIS — J9691 Respiratory failure, unspecified with hypoxia: Secondary | ICD-10-CM | POA: Diagnosis present

## 2020-06-12 DIAGNOSIS — Z7901 Long term (current) use of anticoagulants: Secondary | ICD-10-CM | POA: Diagnosis not present

## 2020-06-12 DIAGNOSIS — Z79899 Other long term (current) drug therapy: Secondary | ICD-10-CM | POA: Diagnosis not present

## 2020-06-12 DIAGNOSIS — R061 Stridor: Secondary | ICD-10-CM | POA: Diagnosis not present

## 2020-06-12 DIAGNOSIS — E785 Hyperlipidemia, unspecified: Secondary | ICD-10-CM | POA: Diagnosis not present

## 2020-06-12 DIAGNOSIS — I5023 Acute on chronic systolic (congestive) heart failure: Secondary | ICD-10-CM

## 2020-06-12 HISTORY — DX: Dyspnea, unspecified: R06.00

## 2020-06-12 LAB — CBC
HCT: 32.4 % — ABNORMAL LOW (ref 36.0–46.0)
Hemoglobin: 10.6 g/dL — ABNORMAL LOW (ref 12.0–15.0)
MCH: 29.4 pg (ref 26.0–34.0)
MCHC: 32.7 g/dL (ref 30.0–36.0)
MCV: 89.8 fL (ref 80.0–100.0)
Platelets: 173 10*3/uL (ref 150–400)
RBC: 3.61 MIL/uL — ABNORMAL LOW (ref 3.87–5.11)
RDW: 14.8 % (ref 11.5–15.5)
WBC: 6 10*3/uL (ref 4.0–10.5)
nRBC: 0 % (ref 0.0–0.2)

## 2020-06-12 LAB — BASIC METABOLIC PANEL
Anion gap: 9 (ref 5–15)
BUN: 19 mg/dL (ref 8–23)
CO2: 24 mmol/L (ref 22–32)
Calcium: 8.9 mg/dL (ref 8.9–10.3)
Chloride: 108 mmol/L (ref 98–111)
Creatinine, Ser: 3.03 mg/dL — ABNORMAL HIGH (ref 0.44–1.00)
GFR, Estimated: 16 mL/min — ABNORMAL LOW (ref >60)
Glucose, Bld: 124 mg/dL — ABNORMAL HIGH (ref 70–99)
Potassium: 4.4 mmol/L (ref 3.5–5.1)
Sodium: 141 mmol/L (ref 135–145)

## 2020-06-12 LAB — TROPONIN I (HIGH SENSITIVITY)
Troponin I (High Sensitivity): 136 ng/L (ref <18)
Troponin I (High Sensitivity): 179 ng/L (ref <18)

## 2020-06-12 LAB — RESP PANEL BY RT-PCR (FLU A&B, COVID) ARPGX2
Influenza A by PCR: NEGATIVE
Influenza B by PCR: NEGATIVE
SARS Coronavirus 2 by RT PCR: NEGATIVE

## 2020-06-12 LAB — BRAIN NATRIURETIC PEPTIDE: B Natriuretic Peptide: 1301.8 pg/mL — ABNORMAL HIGH (ref 0.0–100.0)

## 2020-06-12 MED ORDER — SODIUM CHLORIDE 0.9% FLUSH
3.0000 mL | Freq: Two times a day (BID) | INTRAVENOUS | Status: DC
  Administered 2020-06-12 – 2020-06-13 (×2): 3 mL via INTRAVENOUS

## 2020-06-12 MED ORDER — EZETIMIBE 10 MG PO TABS
10.0000 mg | ORAL_TABLET | Freq: Every day | ORAL | Status: DC
Start: 2020-06-12 — End: 2020-06-13
  Administered 2020-06-12 – 2020-06-13 (×2): 10 mg via ORAL
  Filled 2020-06-12 (×4): qty 1

## 2020-06-12 MED ORDER — APIXABAN 5 MG PO TABS
5.0000 mg | ORAL_TABLET | Freq: Two times a day (BID) | ORAL | Status: DC
  Administered 2020-06-12 – 2020-06-13 (×3): 5 mg via ORAL
  Filled 2020-06-12 (×3): qty 1

## 2020-06-12 MED ORDER — FUROSEMIDE 10 MG/ML IJ SOLN
40.0000 mg | Freq: Two times a day (BID) | INTRAMUSCULAR | Status: DC
  Administered 2020-06-12 – 2020-06-13 (×2): 40 mg via INTRAVENOUS
  Filled 2020-06-12 (×2): qty 4

## 2020-06-12 MED ORDER — ONDANSETRON HCL 4 MG/2ML IJ SOLN: 4.0000 mg | Freq: Four times a day (QID) | INTRAMUSCULAR | Status: DC | PRN

## 2020-06-12 MED ORDER — SODIUM CHLORIDE 0.9% FLUSH: 3.0000 mL | INTRAVENOUS | Status: DC | PRN

## 2020-06-12 MED ORDER — ACETAMINOPHEN 325 MG PO TABS
650.0000 mg | ORAL_TABLET | ORAL | Status: DC | PRN
  Administered 2020-06-12: 650 mg via ORAL
  Filled 2020-06-12: qty 2

## 2020-06-12 MED ORDER — SODIUM CHLORIDE 0.9 % IV SOLN: 250.0000 mL | INTRAVENOUS | Status: DC | PRN

## 2020-06-12 MED ORDER — ASPIRIN 81 MG PO CHEW
324.0000 mg | CHEWABLE_TABLET | Freq: Once | ORAL | Status: AC
  Administered 2020-06-12: 324 mg via ORAL
  Filled 2020-06-12: qty 4

## 2020-06-12 MED ORDER — FUROSEMIDE 10 MG/ML IJ SOLN
40.0000 mg | Freq: Once | INTRAMUSCULAR | Status: AC
  Administered 2020-06-12: 40 mg via INTRAVENOUS
  Filled 2020-06-12: qty 4

## 2020-06-12 MED ORDER — FAMOTIDINE 20 MG PO TABS: 20.0000 mg | ORAL_TABLET | Freq: Every day | ORAL | Status: DC

## 2020-06-12 MED ORDER — CARVEDILOL 6.25 MG PO TABS
6.2500 mg | ORAL_TABLET | Freq: Every day | ORAL | Status: DC
  Administered 2020-06-12: 6.25 mg via ORAL
  Filled 2020-06-12: qty 1

## 2020-06-12 MED ORDER — SERTRALINE HCL 50 MG PO TABS
50.0000 mg | ORAL_TABLET | Freq: Every day | ORAL | Status: DC
  Administered 2020-06-12: 50 mg via ORAL
  Filled 2020-06-12 (×2): qty 1

## 2020-06-12 MED ORDER — ROSUVASTATIN CALCIUM 5 MG PO TABS
10.0000 mg | ORAL_TABLET | Freq: Every day | ORAL | Status: DC
  Administered 2020-06-12: 10 mg via ORAL
  Filled 2020-06-12: qty 2

## 2020-06-12 MED ORDER — ALBUTEROL SULFATE HFA 108 (90 BASE) MCG/ACT IN AERS
2.0000 | INHALATION_SPRAY | RESPIRATORY_TRACT | Status: DC | PRN
  Filled 2020-06-12: qty 6.7

## 2020-06-12 MED ORDER — MAGNESIUM OXIDE 400 (241.3 MG) MG PO TABS
400.0000 mg | ORAL_TABLET | Freq: Two times a day (BID) | ORAL | Status: DC
  Administered 2020-06-12 – 2020-06-13 (×3): 400 mg via ORAL
  Filled 2020-06-12 (×3): qty 1

## 2020-06-12 MED ORDER — ASPIRIN 81 MG PO CHEW
81.0000 mg | CHEWABLE_TABLET | Freq: Every day | ORAL | Status: DC
  Administered 2020-06-13: 81 mg via ORAL
  Filled 2020-06-12: qty 1

## 2020-06-12 MED ORDER — FAMOTIDINE 20 MG PO TABS
20.0000 mg | ORAL_TABLET | Freq: Two times a day (BID) | ORAL | Status: DC
  Administered 2020-06-12: 20 mg via ORAL
  Filled 2020-06-12: qty 1

## 2020-06-12 NOTE — ED Triage Notes (Addendum)
Pt presents to ED BIB [facility]. Pt c/o SOB and wheezing. Pt given douneb w/ EMS. EMS reports pt is 85% on RA. Placed on 4L [place]. Pt vaccinated for covid

## 2020-06-12 NOTE — ED Provider Notes (Signed)
[facility] EMERGENCY DEPARTMENT Provider Note   CSN: [id number] Arrival date & time: 06/12/20  3235   History Chief Complaint  Patient presents with  . Shortness of Breath    [name] is a 69 y.o. female.  The history is provided by the patient and the EMS personnel.  Shortness of Breath She has history of hypertension, hyperlipidemia, paroxysmal atrial fibrillation anticoagulated on apixaban, cardiac arrest, systolic heart failure and comes in because of difficulty breathing.  She states that she woke up to go to the bathroom and noted that she was short of breath.  Dyspnea is worse with any exertion.  She denies cough, fever, chills.  She denies chest pain, heaviness, tightness, pressure.  There has been no nausea or vomiting.  She denies diaphoresis.  She did not have any problems during the day yesterday.  She has been vaccinated against COVID-19, and denies any sick contacts.  She has been compliant with her medications.  EMS noted initial oxygen saturation of 85% on room air and placed on supplemental oxygen.  They also noted wheezing and gave her a nebulizer treatment with albuterol and ipratropium.  Patient states she feels better since then.  Past Medical History:  Diagnosis Date  . Allergy   . Anxiety   . Arnold-Chiari malformation ([place])   . Arthritis   . CHF (congestive heart failure) ([place])   . Chronic systolic heart failure ([place]) 11/03/2019  . Coronary artery disease   . Encounter for assessment of implantable cardioverter-defibrillator (ICD) 11/10/2019  . GERD (gastroesophageal reflux disease)   . H. pylori infection    3 years ago  . H/O cardiac arrest    Hx of VT/Vfib arrest  . Hyperlipidemia   . ICD BiV Medtronic model [id number] Claria Quad CRT-D SureScan ICD 08/25/2019   . Incontinence   . Paroxysmal atrial fibrillation ([place]) 08/06/2019  . Pulmonary edema 2021  . Syncope and collapse    Per pt, denies passing out  . Tobacco use disorder    . Unspecified essential hypertension     Patient Active Problem List   Diagnosis Date Noted  . Traumatic hematoma of right hand 05/28/2020  . Acute renal failure superimposed on stage 4 chronic kidney disease ([place]) 04/16/2020  . Acute respiratory failure with hypoxia ([place]) 04/15/2020  . Heart failure ([place]) 04/14/2020  . Encounter for assessment of implantable cardioverter-defibrillator (ICD) 11/10/2019  . H/O cardiac arrest 08/03/2019 11/10/2019  . Coronary artery disease involving native coronary artery of native heart without angina pectoris 11/03/2019  . Benign hypertensive CKD, stage 1-4 or unspecified chronic kidney disease 11/03/2019  . Acute on chronic systolic CHF (congestive heart failure) ([place]) 11/03/2019  . Lightheadedness 11/03/2019  . CHF (congestive heart failure) ([place]) 10/18/2019  . Pruritus 09/01/2019  . ICD BiV Medtronic model [id number] Claria Quad CRT-D SureScan ICD 08/25/2019   . Cardiac arrest with ventricular fibrillation ([place]) 08/24/2019  . TIA (transient ischemic attack) 08/15/2019  . Long term current use of anticoagulant 08/14/2019  . Cardiomyopathy ([place]) 08/14/2019  . Paroxysmal atrial fibrillation ([place]) 08/06/2019  . Statin intolerance 08/03/2019  . Dysphagia   . History of CVA (cerebrovascular accident) 08/02/2019  . Hypokalemia 08/02/2019  . Left bundle branch block 08/02/2019  . CKD (chronic kidney disease) stage 3, GFR 30-59 ml/min ([place]) 12/13/2018  . Primary localized osteoarthritis of right knee 02/17/2017  . Primary osteoarthritis of right knee 02/17/2017  . Preventative health care 09/01/2016  . Vitamin B12 deficiency 09/10/2015  .  Insomnia 07/16/2015  . GERD (gastroesophageal reflux disease) 03/26/2015  . Fatigue 10/02/2014  . Other malaise and fatigue 02/08/2014  . HLD (hyperlipidemia) 11/19/2011  . Vaginal dryness, menopausal 11/19/2011  . Adjustment disorder 01/08/2011  . Former smoker 12/19/2008  . Essential hypertension 12/19/2008     Past Surgical History:  Procedure Laterality Date  . APPENDECTOMY    . ARNOLD CHIARI SURGERY     neurocranial surgery  . BIV ICD INSERTION CRT-D N/A 08/25/2019   Procedure: BIV ICD INSERTION CRT-D;  Surgeon: Constance Haw, MD;  Location: [facility];  Service: Cardiovascular;  Laterality: N/A;  . BLEPHAROPLASTY     Bil  . C-EYE SURGERY PROCEDURE    . CARDIAC CATHETERIZATION    . CHOLECYSTECTOMY    . EYE SURGERY    . heart failure    . LEFT HEART CATH AND CORONARY ANGIOGRAPHY N/A 08/04/2019   Procedure: LEFT HEART CATH AND CORONARY ANGIOGRAPHY;  Surgeon: Adrian Prows, MD;  Location: [facility];  Service: Cardiovascular;  Laterality: N/A;  . MASS EXCISION Right 12/27/2013   Procedure: MINOR EXCISION OF RIGHT THUMB MUCOID CYST, DEBRIDEMENT OF INTERPHALANGEAL JOINT;  Surgeon: Cammie Sickle, MD;  Location: [facility];  Service: Orthopedics;  Laterality: Right;  . mass on thumb  right  . TOTAL KNEE ARTHROPLASTY Right 02/17/2017  . TOTAL KNEE ARTHROPLASTY Right 02/17/2017   Procedure: TOTAL KNEE ARTHROPLASTY;  Surgeon: Melrose Nakayama, MD;  Location: [facility];  Service: Orthopedics;  Laterality: Right;  . TUBAL LIGATION       OB History   No obstetric history on file.     Family History  Problem Relation Age of Onset  . Bone cancer Mother   . Heart disease Mother   . Cancer Sister        metastatic; unknown primary  . Diverticulosis Sister   . Tuberculosis Father   . Diabetes Sister   . Hypertension Sister   . Colon cancer Neg Hx   . Esophageal cancer Neg Hx   . Pancreatic cancer Neg Hx   . Stomach cancer Neg Hx   . Liver disease Neg Hx   . Kidney disease Neg Hx   . Rectal cancer Neg Hx   . Pancreatic disease Neg Hx     Social History   Tobacco Use  . Smoking status: Former Smoker    Packs/day: 1.00    Years: 50.00    Pack years: 50.00    Types: Cigarettes    Quit date: 08/08/2019    Years since quitting: 0.8  . Smokeless  tobacco: Never Used  Vaping Use  . Vaping Use: Never used  Substance Use Topics  . Alcohol use: No    Alcohol/week: 0.0 standard drinks  . Drug use: No    Home Medications Prior to Admission medications   Medication Sig Start Date End Date Taking? Authorizing Provider  acetaminophen (TYLENOL) 500 MG tablet Take 1,000 mg by mouth 2 (two) times daily as needed (pain).    [provider]  apixaban (ELIQUIS) 5 MG TABS tablet Take 1 tablet (5 mg total) by mouth 2 (two) times daily. 12/08/19   Tolia, Sunit, DO  aspirin 81 MG chewable tablet Chew 1 tablet (81 mg total) by mouth daily. 08/08/19   Alma Friendly, MD  carvedilol (COREG) 12.5 MG tablet TAKE 1 TABLET (12.5 MG TOTAL) BY MOUTH 2 (TWO) TIMES DAILY WITH A MEAL. Patient taking differently: Take 6.25 mg by mouth  at bedtime. 03/05/20   Tolia, Sunit, DO  Cholecalciferol (VITAMIN D3 PO) Take 1,000 Units by mouth in the morning and at bedtime.    [provider]  ezetimibe (ZETIA) 10 MG tablet TAKE 1 TABLET BY MOUTH EVERY DAY Patient taking differently: Take 10 mg by mouth daily. 12/23/19   Tolia, Sunit, DO  Famotidine (PEPCID PO) Take 20 mg by mouth in the morning and at bedtime.     [provider]  Magnesium 400 MG TABS Take 400 mg by mouth 2 (two) times daily.  09/29/19   [provider]  nitroGLYCERIN (NITROSTAT) 0.4 MG SL tablet Place 1 tablet (0.4 mg total) under the tongue every 5 (five) minutes as needed for chest pain. 04/06/20 07/05/20  Cantwell, Celeste C, PA-C  ondansetron (ZOFRAN ODT) 4 MG disintegrating tablet Take 1 tablet (4 mg total) by mouth every 8 (eight) hours as needed for nausea or vomiting. 03/26/20   Elby Beck, FNP  rosuvastatin (CRESTOR) 10 MG tablet TAKE 1 TABLET (10 MG TOTAL) BY MOUTH DAILY AT 6 PM. 12/23/19 04/14/20  Tolia, Sunit, DO  sertraline (ZOLOFT) 50 MG tablet Take 1 tablet (50 mg total) by mouth at bedtime. To start medication- take 1/2 tablet at bedtime for 7 days  then increase to 1 tablet at bedtime. Patient taking differently: Take 50 mg by mouth at bedtime. 01/19/20   Elby Beck, FNP  torsemide (DEMADEX) 5 MG tablet Take 1 tablet (5 mg total) by mouth every Monday, Wednesday, and Friday. 04/23/20   Tolia, Sunit, DO    Allergies    Shellfish-derived products, Ativan [lorazepam], Buspar [buspirone], Clarithromycin, Codeine, Doxycycline, Guaifenesin, Oxycodone, Prednisone, Statins, Sulfonamide derivatives, Tetracycline, Vicodin [hydrocodone-acetaminophen], Famotidine, Latex, Omeprazole, Peanut-containing drug products, Protonix [pantoprazole], and Wellbutrin [bupropion]  Review of Systems   Review of Systems  Respiratory: Positive for shortness of breath.   All other systems reviewed and are negative.   Physical Exam Updated Vital Signs BP (!) 130/59   Pulse 70   Temp 97.9 F (36.6 C) (Oral)   Resp 16   SpO2 96%   Physical Exam Vitals and nursing note reviewed.   69 year old female, resting comfortably and in no acute distress. Vital signs are normal. Oxygen saturation is 96%, which is normal, but obtained well on supplemental oxygen. Head is normocephalic and atraumatic. PERRLA, EOMI. Oropharynx is clear. Neck is nontender and supple without adenopathy or JVD. Back is nontender and there is no CVA tenderness. Lungs have bibasilar rales without wheezes or rhonchi. Chest is nontender. Heart has regular rate and rhythm without murmur. Abdomen is soft, flat, nontender without masses or hepatosplenomegaly and peristalsis is normoactive. Extremities have no cyanosis or edema, full range of motion is present. Skin is warm and dry without rash. Neurologic: Mental status is normal, cranial nerves are intact, there are no motor or sensory deficits.  ED Results / Procedures / Treatments   Labs (all labs ordered are listed, but only abnormal results are displayed) Labs Reviewed  BASIC METABOLIC PANEL - Abnormal; Notable for the following  components:      Result Value   Glucose, Bld 124 (*)    Creatinine, Ser 3.03 (*)    GFR, Estimated 16 (*)    All other components within normal limits  CBC - Abnormal; Notable for the following components:   RBC 3.61 (*)    Hemoglobin 10.6 (*)    HCT 32.4 (*)    All other components within normal limits  BRAIN  NATRIURETIC PEPTIDE - Abnormal; Notable for the following components:   B Natriuretic Peptide 1,301.8 (*)    All other components within normal limits  TROPONIN I (HIGH SENSITIVITY) - Abnormal; Notable for the following components:   Troponin I (High Sensitivity) 136 (*)    All other components within normal limits  RESP PANEL BY RT-PCR (FLU A&B, COVID) ARPGX2  TROPONIN I (HIGH SENSITIVITY)    EKG EKG Interpretation  Date/Time:  Tuesday June 12 2020 02:43:49 EST Ventricular Rate:  69 PR Interval:  176 QRS Duration: 126 QT Interval:  488 QTC Calculation: 522 R Axis:   -63 Text Interpretation: Atrial-paced rhythm Left axis deviation Non-specific intra-ventricular conduction block Lateral infarct , age undetermined T wave abnormality, consider anterior ischemia Abnormal ECG When compared with ECG of 04/14/2020, No significant change was found Confirmed by Delora Fuel ([id number]) on 06/12/2020 5:06:33 AM   Radiology DG Chest 2 View  Result Date: 06/12/2020 CLINICAL DATA:  Dyspnea EXAM: CHEST - 2 VIEW COMPARISON:  April 14, 2020 FINDINGS: The heart size and mediastinal contours are unchanged with mild cardiomegaly. Aortic knob calcifications are seen. A left-sided pacemaker seen with the lead tips in the right atrium and right ventricle. A trace right pleural effusion the pain. Mildly increased interstitial markings of seen at both lung bases as on prior exam. The visualized skeletal structures are unremarkable. IMPRESSION: Trace right pleural effusion. Increased interstitial markings at both lung bases which may be due to chronic lung changes Electronically Signed   By:  Prudencio Pair M.D.   On: 06/12/2020 03:31    Procedures Procedures  Medications Ordered in ED Medications  albuterol (VENTOLIN HFA) 108 (90 Base) MCG/ACT inhaler 2 puff (has no administration in time range)  furosemide (LASIX) injection 40 mg (has no administration in time range)    ED Course  I have reviewed the triage vital signs and the nursing notes.  Pertinent labs & imaging results that were available during my care of the patient were reviewed by me and considered in my medical decision making (see chart for details).  MDM Rules/Calculators/A&P Acute dyspnea and patient with history of heart failure.  This most likely represents CHF exacerbation.  Consider pneumonia, bronchospasm.  However, no cough or fever to suggest pneumonia.  Doubt pulmonary embolism as she is anticoagulated.  Also, consider angina equivalent.  ECG shows atrial paced rhythm and is unchanged from baseline.  Chest x-ray shows increased interstitial markings at the bases of uncertain cause.  Creatinine is significantly elevated at 3.03, but this is actually improved compared with 3.27 on 11/8.  Mild anemia is present and not significantly changed from baseline.  Troponin and BNP are pending.  Old records are reviewed, and she was admitted for pulmonary edema on 04/14/2020.  Echocardiogram at that time showed ejection fraction of 25-30%.  Cardiac catheterization in February showed 70-80% stenosis in the RCA and 30-40% stenosis in LAD and circumflex.  Troponin is mildly elevated, but higher than it has been in the past.  This will need to be trended.  BNP is significantly elevated although not as high as it had been in the past.  She is given a dose of furosemide.  She is on 5 mg torsemide 3 times a week.  Given her frail condition with the renal insufficiency and elevated troponin, I feel she needs to be admitted.  He is given a dose of aspirin.  Case is signed out to Dr. Jeanell Sparrow to discuss with [facility] hospitalists.  Final  Clinical Impression(s) / ED Diagnoses Final diagnoses:  Acute on chronic systolic heart failure (HCC)  Renal insufficiency  Hypoxia  Normochromic normocytic anemia  Elevated troponin    Rx / DC Orders ED Discharge Orders    None       Delora Fuel, MD 62/22/97 [phone]

## 2020-06-12 NOTE — H&P (Addendum)
History and Physical    [name] ALP:[id number] DOB: [birth year] DOA: 06/12/2020  Referring MD/NP/PA: Pattricia Boss, MD PCP: Elby Beck, FNP  Consultants: Dr. Tolia-cardiology Dr. Ardelle Lesches  Patient coming from: Home via EMS  Chief Complaint: Shortness of breath  I have personally briefly reviewed patient's old medical records in [place]   HPI: [name] is a 69 y.o. 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  For the last week patient felt mildly short of breath, but thought it was secondary to her doing a lot over the holiday.  She got up to go to the restroom around 12 AM this morning and had gotten really short of breath and doing so.  She tried to lay back down, but kept feeling like she had something stuck in her throat.  Sitting up seem to help some.  Noted associated symptoms of some anxiousness due to the symptoms and inability to go back to sleep.  Denies having any recent change in weight, cough, wheezing, fever, chills, chest pain, nausea, vomiting, abdominal pain, leg swelling, or change in medication regimen.  She has been taking all of her medications as advised including torsemide 5 mg Monday, Wednesday, Friday.  Normally she is not on oxygen. Upon EMS arrival O2 saturations were 85% for which she was placed on 4 L nasal cannula oxygen with improvement in O2 saturations.  She was also given breathing treatments.  Patient confirms that she would like to keep her DO NOT RESUSCITATE status in place during this hospitalization.  She is followed by Dr. Terri Skains of [facility] cardiology.  She reports that she quit smoking  ED Course: Upon admission to the emergency department patient was seen to be afebrile with respirations 11-29, O2 saturations 88% improved greater than 90%  on 2 L nasal cannula oxygen at this time, and all other vital signs maintained.  Labs significant for BNP 1301.8 and high-sensitivity troponin 136.  Chest x-ray showed cardiomegaly unchanged with trace right-sided pleural effusion and increased interstitial markings.  Influenza and COVID-19 screening were pending.  Patient had been given aspirin 324 mg and furosemide 40 mg IV.   Review of Systems  Constitutional: Negative for fever and weight loss.  HENT: Negative for congestion and sinus pain.   Eyes: Negative for photophobia and pain.  Respiratory: Positive for shortness of breath. Negative for cough.   Cardiovascular: Positive for orthopnea. Negative for chest pain and leg swelling.  Gastrointestinal: Negative for abdominal pain, diarrhea, heartburn, nausea and vomiting.  Genitourinary: Negative for dysuria and hematuria.  Musculoskeletal: Negative for falls and myalgias.  Skin: Negative for itching.  Neurological: Negative for focal weakness and loss of consciousness.  Endo/Heme/Allergies: Bruises/bleeds easily.  Psychiatric/Behavioral: Negative for substance abuse. The patient is nervous/anxious and has insomnia.     Past Medical History:  Diagnosis Date  . Allergy   . Anxiety   . Arnold-Chiari malformation ([place])   . Arthritis   . CHF (congestive heart failure) ([place])   . Chronic systolic heart failure ([place]) 11/03/2019  . Coronary artery disease   . Encounter for assessment of implantable cardioverter-defibrillator (ICD) 11/10/2019  . GERD (gastroesophageal reflux disease)   . H. pylori infection    3 years ago  . H/O cardiac arrest    Hx of VT/Vfib arrest  . Hyperlipidemia   .  ICD BiV Medtronic model [id number] [place] Quad CRT-D SureScan ICD 08/25/2019   . Incontinence   . Paroxysmal atrial fibrillation ([place]) 08/06/2019  . Pulmonary edema 2021  . Syncope and collapse    Per pt, denies passing out  . Tobacco use disorder   . Unspecified essential hypertension     Past Surgical  History:  Procedure Laterality Date  . APPENDECTOMY    . ARNOLD CHIARI SURGERY     neurocranial surgery  . BIV ICD INSERTION CRT-D N/A 08/25/2019   Procedure: BIV ICD INSERTION CRT-D;  Surgeon: Constance Haw, MD;  Location: [facility];  Service: Cardiovascular;  Laterality: N/A;  . BLEPHAROPLASTY     Bil  . C-EYE SURGERY PROCEDURE    . CARDIAC CATHETERIZATION    . CHOLECYSTECTOMY    . EYE SURGERY    . heart failure    . LEFT HEART CATH AND CORONARY ANGIOGRAPHY N/A 08/04/2019   Procedure: LEFT HEART CATH AND CORONARY ANGIOGRAPHY;  Surgeon: Adrian Prows, MD;  Location: [facility];  Service: Cardiovascular;  Laterality: N/A;  . MASS EXCISION Right 12/27/2013   Procedure: MINOR EXCISION OF RIGHT THUMB MUCOID CYST, DEBRIDEMENT OF INTERPHALANGEAL JOINT;  Surgeon: Cammie Sickle, MD;  Location: [facility];  Service: Orthopedics;  Laterality: Right;  . mass on thumb  right  . TOTAL KNEE ARTHROPLASTY Right 02/17/2017  . TOTAL KNEE ARTHROPLASTY Right 02/17/2017   Procedure: TOTAL KNEE ARTHROPLASTY;  Surgeon: Melrose Nakayama, MD;  Location: [facility];  Service: Orthopedics;  Laterality: Right;  . TUBAL LIGATION       reports that she quit smoking about 10 months ago. Her smoking use included cigarettes. She has a 50.00 pack-year smoking history. She has never used smokeless tobacco. She reports that she does not drink alcohol and does not use drugs.  Allergies  Allergen Reactions  . Shellfish-Derived Products Anaphylaxis  . Ativan [Lorazepam] Other (See Comments)    Makes "skin crawl" and insomnia  . Buspar [Buspirone] Nausea Only    Sweating and dizzy  . Clarithromycin Swelling  . Codeine Nausea And Vomiting  . Doxycycline Other (See Comments)    Unknown  . Guaifenesin Other (See Comments)    Palpitations  . Oxycodone Nausea And Vomiting  . Prednisone Nausea And Vomiting and Other (See Comments)    Makes my heart race  . Statins Other (See Comments)     Muscle pain  . Sulfonamide Derivatives Nausea And Vomiting    Achiness  . Tetracycline Nausea Only  . Vicodin [Hydrocodone-Acetaminophen] Nausea And Vomiting  . Famotidine Rash  . Latex Rash  . Omeprazole Nausea Only    Cough, shortness of breath - patient doesn't remember  . Peanut-Containing Drug Products Itching and Rash  . Protonix [Pantoprazole] Rash  . Wellbutrin [Bupropion] Palpitations    Family History  Problem Relation Age of Onset  . Bone cancer Mother   . Heart disease Mother   . Cancer Sister        metastatic; unknown primary  . Diverticulosis Sister   . Tuberculosis Father   . Diabetes Sister   . Hypertension Sister   . Colon cancer Neg Hx   . Esophageal cancer Neg Hx   . Pancreatic cancer Neg Hx   . Stomach cancer Neg Hx   . Liver disease Neg Hx   . Kidney disease Neg Hx   . Rectal cancer Neg Hx   . Pancreatic disease Neg Hx  Prior to Admission medications   Medication Sig Start Date End Date Taking? Authorizing Provider  acetaminophen (TYLENOL) 500 MG tablet Take 1,000 mg by mouth 2 (two) times daily as needed (pain).   Yes [provider]  apixaban (ELIQUIS) 5 MG TABS tablet Take 1 tablet (5 mg total) by mouth 2 (two) times daily. 12/08/19  Yes Tolia, Sunit, DO  aspirin 81 MG chewable tablet Chew 1 tablet (81 mg total) by mouth daily. 08/08/19  Yes Alma Friendly, MD  carvedilol (COREG) 12.5 MG tablet TAKE 1 TABLET (12.5 MG TOTAL) BY MOUTH 2 (TWO) TIMES DAILY WITH A MEAL. Patient taking differently: Take 6.25 mg by mouth at bedtime. 03/05/20  Yes Tolia, Sunit, DO  Cholecalciferol (VITAMIN D3 PO) Take 1,000 Units by mouth in the morning and at bedtime.   Yes [provider]  ezetimibe (ZETIA) 10 MG tablet TAKE 1 TABLET BY MOUTH EVERY DAY Patient taking differently: Take 10 mg by mouth daily. 12/23/19  Yes Tolia, Sunit, DO  Famotidine (PEPCID PO) Take 20 mg by mouth in the morning and at bedtime.    Yes [provider]   Magnesium 400 MG TABS Take 400 mg by mouth 2 (two) times daily.  09/29/19  Yes [provider]  nitroGLYCERIN (NITROSTAT) 0.4 MG SL tablet Place 1 tablet (0.4 mg total) under the tongue every 5 (five) minutes as needed for chest pain. 04/06/20 07/05/20 Yes Cantwell, Celeste C, PA-C  ondansetron (ZOFRAN ODT) 4 MG disintegrating tablet Take 1 tablet (4 mg total) by mouth every 8 (eight) hours as needed for nausea or vomiting. 03/26/20  Yes Elby Beck, FNP  rosuvastatin (CRESTOR) 10 MG tablet TAKE 1 TABLET (10 MG TOTAL) BY MOUTH DAILY AT 6 PM. 12/23/19 04/14/20 Yes Tolia, Sunit, DO  sertraline (ZOLOFT) 50 MG tablet Take 1 tablet (50 mg total) by mouth at bedtime. To start medication- take 1/2 tablet at bedtime for 7 days then increase to 1 tablet at bedtime. Patient taking differently: Take 50 mg by mouth at bedtime. 01/19/20  Yes Elby Beck, FNP  torsemide [facility]) 5 MG tablet Take 1 tablet (5 mg total) by mouth every Monday, Wednesday, and Friday. 04/23/20  Yes Rex Kras, DO    Physical Exam:  Constitutional: Thin elderly female who appears to be in no acute distress at this time Vitals:   06/12/20 0630 06/12/20 0645 06/12/20 0700 06/12/20 0735  BP: (!) 121/58 (!) 125/59 (!) 120/59 (!) 132/57  Pulse: 70 69 70 70  Resp: 16 13 15 12   Temp:    98.2 F (36.8 C)  TempSrc:    Oral  SpO2: 92% 94% 94% 98%   Eyes: PERRL, lids and conjunctivae normal ENMT: Mucous membranes are moist. Posterior pharynx clear of any exudate or lesions.  Neck: normal, supple, no masses, no thyromegaly.  Subtle JVD appreciated. Respiratory: Some intermittent crackles appreciated in both lung fields.  Able to talk in relatively complete sentences on 2 L nasal cannula oxygen with O2 saturations maintained at 93%. Cardiovascular: Regular rate and rhythm, positive 2/6 systolic murmur.. No extremity edema. 2+ pedal pulses. No carotid bruits.  Abdomen: no tenderness, no masses palpated. No  hepatosplenomegaly. Bowel sounds positive.  Musculoskeletal: no clubbing / cyanosis. No joint deformity upper and lower extremities. Good ROM, no contractures. Normal muscle tone.  Skin: no rashes, lesions, ulcers. No induration Neurologic: CN 2-12 grossly intact. Sensation intact, DTR normal. Strength 5/5 in all 4.  Psychiatric: Normal judgment and insight. Alert and  oriented x 3. Normal mood.     Labs on Admission: I have personally reviewed following labs and imaging studies  CBC: Recent Labs  Lab 06/12/20 0256  WBC 6.0  HGB 10.6*  HCT 32.4*  MCV 89.8  PLT 102   Basic Metabolic Panel: Recent Labs  Lab 06/12/20 0256  NA 141  K 4.4  CL 108  CO2 24  GLUCOSE 124*  BUN 19  CREATININE 3.03*  CALCIUM 8.9   GFR: CrCl cannot be calculated (Unknown ideal weight.). Liver Function Tests: No results for input(s): AST, ALT, ALKPHOS, BILITOT, PROT, ALBUMIN in the last 168 hours. No results for input(s): LIPASE, AMYLASE in the last 168 hours. No results for input(s): AMMONIA in the last 168 hours. Coagulation Profile: No results for input(s): INR, PROTIME in the last 168 hours. Cardiac Enzymes: No results for input(s): CKTOTAL, CKMB, CKMBINDEX, TROPONINI in the last 168 hours. BNP (last 3 results) Recent Labs    10/20/19 1208 11/10/19 1507 11/17/19 1031  PROBNP 3,188* 18,452* 13,138*   HbA1C: No results for input(s): HGBA1C in the last 72 hours. CBG: No results for input(s): GLUCAP in the last 168 hours. Lipid Profile: No results for input(s): CHOL, HDL, LDLCALC, TRIG, CHOLHDL, LDLDIRECT in the last 72 hours. Thyroid Function Tests: No results for input(s): TSH, T4TOTAL, FREET4, T3FREE, THYROIDAB in the last 72 hours. Anemia Panel: No results for input(s): VITAMINB12, FOLATE, FERRITIN, TIBC, IRON, RETICCTPCT in the last 72 hours. Urine analysis:    Component Value Date/Time   COLORURINE YELLOW 04/23/2020 1252   APPEARANCEUR CLEAR 04/23/2020 1252   LABSPEC 1.015  04/23/2020 1252   PHURINE 6.0 04/23/2020 1252   GLUCOSEU NEGATIVE 04/23/2020 1252   HGBUR TRACE-INTACT (A) 04/23/2020 1252   HGBUR negative 03/01/2007 1207   BILIRUBINUR NEGATIVE 04/23/2020 1252   BILIRUBINUR negative 03/26/2020 1543   KETONESUR NEGATIVE 04/23/2020 1252   PROTEINUR Negative 03/26/2020 1543   PROTEINUR NEGATIVE 08/14/2019 2345   UROBILINOGEN 0.2 04/23/2020 1252   NITRITE NEGATIVE 04/23/2020 1252   LEUKOCYTESUR NEGATIVE 04/23/2020 1252   Sepsis Labs: No results found for this or any previous visit (from the past 240 hour(s)).   Radiological Exams on Admission: DG Chest 2 View  Result Date: 06/12/2020 CLINICAL DATA:  Dyspnea EXAM: CHEST - 2 VIEW COMPARISON:  April 14, 2020 FINDINGS: The heart size and mediastinal contours are unchanged with mild cardiomegaly. Aortic knob calcifications are seen. A left-sided pacemaker seen with the lead tips in the right atrium and right ventricle. A trace right pleural effusion the pain. Mildly increased interstitial markings of seen at both lung bases as on prior exam. The visualized skeletal structures are unremarkable. IMPRESSION: Trace right pleural effusion. Increased interstitial markings at both lung bases which may be due to chronic lung changes Electronically Signed   By: Prudencio Pair M.D.   On: 06/12/2020 03:31    EKG: Independently reviewed.  Atrial paced rhythm at 69 bpm similar to previous tracing  Assessment/Plan Respiratory failure with hypoxia secondary to combined systolic and diastolic congestive heart failure: Acute on chronic.  Patient presents with acute onset of shortness of breath overnight.  O2 saturations reported as low as 85% on room air with EMS currently with O2 saturation maintained on 2 L nasal cannula oxygen. BNP elevated up to 1301.8.  Chest x-ray showing trace right-sided pleural effusion with increased interstitial markings which likely suspect is edema.  Patient just recently had echocardiogram in  03/2020 which revealed EF of 25 to 30% with global  hypokinesis. -Admit to a telemetry bed -Heart failure orders set  initiated  -Continuous pulse oximetry with nasal cannula oxygen as needed to keep O2 saturations >92% -Strict I&Os and daily weights -Lasix 40 mg IV Bid  -Reassess in a.m. and adjust diuresis as needed. -Obtain repeat echocardiogram if recommended by cardiology -Continue beta-blocker -No ACE inhibitor/ARB due to chronic kidney disease  -PT to evaluate and treat [facility] cardiology notified of the patient being admitted into the hospital. Patient was not able to tolerate Entresto due to worsening kidney function.  Elevated troponin history of nonischemic cardiomyopathy: On admission high-sensitivity troponin elevated at 136.  Left heart cath from 08/04/2019 noted significant disease in the mid segment of the RV branch with 70 to 80% stenosis that did not explain global hypokinesis. Otherwise mild to moderate disease was noted everywhere else. In the setting of congestive heart failure exacerbation would suspect likely secondary to demand. -Follow-up repeat troponin   Chronic kidney disease stage IV: Stable.  Creatinine on admission 3.03 which appears fairly similar over the past 3 months. Established care with Dr. Rolan Lipa of nephrology. At this time she is still able to make urine, and states that they only discussed possible need to get dialysis in the future. -Continue to monitor kidney function with diuresis -Discussed case with nephrology who recommended continue with diuresis for now and reassess. Recommend patient following up in the outpatient setting with nephrology and   History of VT/ V. fib arrest s/p AICD: Patient had Medtronic ICD placed in 08/2019 by Dr. Curt Bears. -Please interrogate ICD  Paroxysmal atrial fibrillation on chronic anticoagulation: EKG reveals a paced rhythm at 69 bpm.  Patient has been compliant with Eliquis. -Continue Eliquis  Normocytic  anemia: Chronic.  Hemoglobin 10.6 g/dL which appears near patient's baseline. -Continue to monitor  Dyslipidemia: Home medications include Crestor 10 mg daily and Zetia 10 mg daily -Continue home medication regimen  History of CVA -Continue aspirin, statin, and Eliquis  GERD -Continue home medication regimen of Pepcid 20 mg twice daily.  History of tobacco abuse: Patient reports quitting tobacco earlier this year. -Encouraged continued cessation of tobacco  DNR: Patient confirms that she would like to keep the DO NOT RESUSCITATE order in place  DVT prophylaxis: Eliquis Code Status: DNR Family Communication: Declined need to update husband as it will make him anxious Disposition Plan: Hopefully discharge home in 2 to 3 days Consults called: Discussed over the phone with cardiology and nephrology Admission status: Inpatient, require more than 2 midnight stay for IV diuresis and monitoring of kidney  Function  Norval Morton MD [facility]   If 7PM-7AM, please contact night-coverage   06/12/2020, 7:45 AM

## 2020-06-12 NOTE — ED Notes (Signed)
Oxygen decreased from 2L to 1L

## 2020-06-12 NOTE — ED Notes (Signed)
Pt now tolerating RA at 94%

## 2020-06-12 NOTE — ED Provider Notes (Signed)
69 yo EF 25% ho chf presents with sudden worsening of dyspnea last night.  Sats were 80s on EMS arrival.  Patient received IV lasix here and now on 2 l/m.  Patient with known ckd and improved here with lasix. Physical Exam  BP (!) 122/55   Pulse 70   Temp 97.9 F (36.6 C) (Oral)   Resp (!) 29   SpO2 94%   Physical Exam  ED Course/Procedures     Procedures  MDM  CHF exacerbation Covid test pending. Plan admission for ongoing evaluation and treatment.   Discussed with Dr. Tamala Julian who will see for admission   Pattricia Boss, MD 06/12/20 [phone]

## 2020-06-12 NOTE — ED Notes (Signed)
Zetia requested from pharm

## 2020-06-12 NOTE — ED Notes (Signed)
Lunch Tray Ordered @ 1029. 

## 2020-06-12 NOTE — Evaluation (Signed)
Physical Therapy Evaluation Patient Details Name: [name] MRN: [id number] DOB: 10/21/1950 Today's Date: 06/12/2020   History of Present Illness  Pt is a 69 y/o female admitted secondary to increased SOB. Thought to be from CHF exacerbation. PMH includes CVA, a fib, CKD, s/p ICD, CHF, HTN, and R TKA.  Clinical Impression  Pt admitted secondary to problem above and deficits below. Pt requiring min guard to supervision to ambulate within ED room. Oxygen sats >95% on 2L throughout mobility. Anticipate pt will progress well and will not require follow up PT. Will continue to follow acutely.     Follow Up Recommendations No PT follow up    Equipment Recommendations  None recommended by PT    Recommendations for Other Services       Precautions / Restrictions Precautions Precautions: Fall Restrictions Weight Bearing Restrictions: No      Mobility  Bed Mobility Overal bed mobility: Modified Independent                  Transfers Overall transfer level: Needs assistance Equipment used: None Transfers: Sit to/from Stand Sit to Stand: Min guard         General transfer comment: Min guard for safety.  Ambulation/Gait Ambulation/Gait assistance: Min guard;Supervision Gait Distance (Feet): 15 Feet Assistive device: None Gait Pattern/deviations: Step-through pattern;Decreased stride length Gait velocity: Decreased   General Gait Details: Ambulated short distance within ED room. Noted mild LOB, but able to self correct. Min guard to supervision for safety.  Stairs            Wheelchair Mobility    Modified Rankin (Stroke Patients Only)       Balance Overall balance assessment: Needs assistance Sitting-balance support: No upper extremity supported;Feet supported Sitting balance-Leahy Scale: Good     Standing balance support: No upper extremity supported Standing balance-Leahy Scale: Fair                               Pertinent  Vitals/Pain Pain Assessment: No/denies pain    Home Living Family/patient expects to be discharged to:: Private residence Living Arrangements: Spouse/significant other Available Help at Discharge: Family;Available 24 hours/day Type of Home: House Home Access: Stairs to enter Entrance Stairs-Rails: Psychiatric nurse of Steps: 2 Home Layout: Two level;Able to live on main level with bedroom/bathroom Home Equipment: Gilford Rile - 2 wheels;Cane - single point;Bedside commode;Shower seat      Prior Function Level of Independence: Independent               Hand Dominance        Extremity/Trunk Assessment   Upper Extremity Assessment Upper Extremity Assessment: Overall WFL for tasks assessed    Lower Extremity Assessment Lower Extremity Assessment: Generalized weakness    Cervical / Trunk Assessment Cervical / Trunk Assessment: Normal  Communication   Communication: No difficulties  Cognition Arousal/Alertness: Awake/alert Behavior During Therapy: WFL for tasks assessed/performed Overall Cognitive Status: Within Functional Limits for tasks assessed                                        General Comments General comments (skin integrity, edema, etc.): Oxygen WFL on 2L    Exercises     Assessment/Plan    PT Assessment Patient needs continued PT services  PT Problem List Decreased activity tolerance;Decreased balance;Decreased mobility;Cardiopulmonary status limiting activity  PT Treatment Interventions DME instruction;Therapeutic activities;Functional mobility training;Stair training;Gait training;Balance training;Therapeutic exercise;Patient/family education    PT Goals (Current goals can be found in the Care Plan section)  Acute Rehab PT Goals Patient Stated Goal: to go home PT Goal Formulation: With patient Time For Goal Achievement: 06/26/20 Potential to Achieve Goals: Good    Frequency Min 3X/week   Barriers to  discharge        Co-evaluation               AM-PAC PT "6 Clicks" Mobility  Outcome Measure Help needed turning from your back to your side while in a flat bed without using bedrails?: None Help needed moving from lying on your back to sitting on the side of a flat bed without using bedrails?: None Help needed moving to and from a bed to a chair (including a wheelchair)?: A Little Help needed standing up from a chair using your arms (e.g., wheelchair or bedside chair)?: A Little Help needed to walk in hospital room?: A Little Help needed climbing 3-5 steps with a railing? : A Little 6 Click Score: 20    End of Session Equipment Utilized During Treatment: Oxygen Activity Tolerance: Patient tolerated treatment well Patient left: in bed;with call bell/phone within reach Nurse Communication: Mobility status PT Visit Diagnosis: Other abnormalities of gait and mobility (R26.89)    Time: 7943-2761 PT Time Calculation (min) (ACUTE ONLY): 14 min   Charges:   PT Evaluation $PT Eval Low Complexity: 1 Low          Lou Miner, DPT  Acute Rehabilitation Services  Pager: [phone] Office: [phone]   Rudean Hitt 06/12/2020, 11:44 AM

## 2020-06-13 ENCOUNTER — Other Ambulatory Visit (HOSPITAL_COMMUNITY): Payer: Self-pay | Admitting: Internal Medicine

## 2020-06-13 DIAGNOSIS — Z7901 Long term (current) use of anticoagulants: Secondary | ICD-10-CM | POA: Diagnosis not present

## 2020-06-13 DIAGNOSIS — R0902 Hypoxemia: Secondary | ICD-10-CM

## 2020-06-13 DIAGNOSIS — R0602 Shortness of breath: Secondary | ICD-10-CM

## 2020-06-13 DIAGNOSIS — Z8673 Personal history of transient ischemic attack (TIA), and cerebral infarction without residual deficits: Secondary | ICD-10-CM | POA: Diagnosis not present

## 2020-06-13 DIAGNOSIS — E782 Mixed hyperlipidemia: Secondary | ICD-10-CM | POA: Diagnosis not present

## 2020-06-13 DIAGNOSIS — I480 Paroxysmal atrial fibrillation: Secondary | ICD-10-CM | POA: Diagnosis not present

## 2020-06-13 DIAGNOSIS — N289 Disorder of kidney and ureter, unspecified: Secondary | ICD-10-CM | POA: Diagnosis not present

## 2020-06-13 DIAGNOSIS — R778 Other specified abnormalities of plasma proteins: Secondary | ICD-10-CM | POA: Diagnosis not present

## 2020-06-13 DIAGNOSIS — J90 Pleural effusion, not elsewhere classified: Secondary | ICD-10-CM | POA: Insufficient documentation

## 2020-06-13 DIAGNOSIS — Z66 Do not resuscitate: Secondary | ICD-10-CM | POA: Diagnosis not present

## 2020-06-13 DIAGNOSIS — N184 Chronic kidney disease, stage 4 (severe): Secondary | ICD-10-CM | POA: Diagnosis not present

## 2020-06-13 DIAGNOSIS — Z95810 Presence of automatic (implantable) cardiac defibrillator: Secondary | ICD-10-CM | POA: Diagnosis not present

## 2020-06-13 DIAGNOSIS — J9691 Respiratory failure, unspecified with hypoxia: Secondary | ICD-10-CM

## 2020-06-13 DIAGNOSIS — I5043 Acute on chronic combined systolic (congestive) and diastolic (congestive) heart failure: Secondary | ICD-10-CM | POA: Diagnosis not present

## 2020-06-13 LAB — BASIC METABOLIC PANEL
Anion gap: 11 (ref 5–15)
BUN: 19 mg/dL (ref 8–23)
CO2: 26 mmol/L (ref 22–32)
Calcium: 8.7 mg/dL — ABNORMAL LOW (ref 8.9–10.3)
Chloride: 103 mmol/L (ref 98–111)
Creatinine, Ser: 2.91 mg/dL — ABNORMAL HIGH (ref 0.44–1.00)
GFR, Estimated: 17 mL/min — ABNORMAL LOW (ref >60)
Glucose, Bld: 94 mg/dL (ref 70–99)
Potassium: 3.8 mmol/L (ref 3.5–5.1)
Sodium: 140 mmol/L (ref 135–145)

## 2020-06-13 LAB — TROPONIN I (HIGH SENSITIVITY)
Troponin I (High Sensitivity): 57 ng/L — ABNORMAL HIGH (ref <18)
Troponin I (High Sensitivity): 49 ng/L — ABNORMAL HIGH (ref <18)

## 2020-06-13 LAB — MAGNESIUM: Magnesium: 2.3 mg/dL (ref 1.7–2.4)

## 2020-06-13 MED ORDER — ALBUTEROL SULFATE HFA 108 (90 BASE) MCG/ACT IN AERS: 2.0000 | INHALATION_SPRAY | RESPIRATORY_TRACT | 0 refills | Status: DC | PRN

## 2020-06-13 MED FILL — ALBUTEROL SULFATE HFA 108 (: 108 (90 BAS | 16 days supply | Qty: 18 | Fill #0

## 2020-06-13 NOTE — Care Management CC44 (Signed)
Condition Code 44 Documentation Completed  Patient Details  Name: FANTASIA JINKINS MRN: [id number] Date of Birth: 1951/02/10   Condition Code 44 given:  Yes Patient signature on Condition Code 44 notice:  Yes Documentation of 2 MD's agreement:  Yes Code 44 added to claim:  Yes    Marilu Favre, RN 06/13/2020, 3:01 PM

## 2020-06-13 NOTE — Discharge Summary (Signed)
Physician Discharge Summary  [name] YBO:[id number] DOB: 1951-03-27 DOA: 06/12/2020  PCP: Elby Beck, FNP  Admit date: 06/12/2020 Discharge date: 06/13/2020  Admitted From: Home Disposition:  Home  Recommendations for Outpatient Follow-up:  1. Follow up with PCP in 1-2 weeks 2. Follow up with Cardiology as scheduled: 3. Recommend Pulmonary follow up as outpatient and consider PFT studies  Discharge Condition:Stable CODE STATUS:DNR Diet recommendation: Heart healthy   Brief/Interim Summary:  69 y.o. 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 Pt was initially admitted with concerns for possible CHF exacerbation,  Discharge Diagnoses:  Principal Problem:   Acute on chronic combined systolic and diastolic congestive heart failure (HCC) Active Problems:   HLD (hyperlipidemia)   History of CVA (cerebrovascular accident)   Paroxysmal atrial fibrillation ([place])   Long term current use of anticoagulant   ICD BiV Medtronic model [id number] Claria Quad CRT-D SureScan ICD 08/25/2019   Elevated troponin   CKD (chronic kidney disease) stage 4, GFR 15-29 ml/min (HCC)   DNR (do not resuscitate)   Normocytic anemia   Respiratory failure with hypoxia (HCC)   Shortness of breath   Hypoxia   Renal insufficiency   Expiratory wheezing on left side of chest  Respiratory failure with hypoxia secondary to possible, acute systolic CHF exacerbation ruled out -Patient presented with acute onset of shortness of breath with hypoxemia at time of presentation -Initial concern was for acute CHF, received IV lasix -Clinically much improved this AM -Discussed case with Cardiology. Pt likely not in acute heart failure -Given hx of prior extensive tobacco abuse, Cardiology had recommended Pulmonary follow up and  possible PFT studies to r/o emphysema -per Cardiology, OK to d/c from cardiac perspective -Have prescribed rescue inhaler at time of d/c  Elevated troponin history of nonischemic cardiomyopathy: On admission high-sensitivity troponin elevated at 136.  Left heart cath from 08/04/2019 noted significant disease in the mid segment of the RV branch with 70 to 80% stenosis that did not explain global hypokinesis. Otherwise mild to moderate disease was noted everywhere else. -Likely demand ischemia -Troponin trended down  Chronic kidney disease stage IV: Stable.  Creatinine on admission 3.03 which appears fairly similar over the past 3 months. Established care with Dr. Rolan Lipa of nephrology. At this time she is still able to make urine, and states that they only discussed possible need to get dialysis in the future. -Recommend patient following up in the outpatient setting with nephrology and   History of VT/ V. fib arrest s/p AICD: Patient had Medtronic ICD placed in 08/2019 by Dr. Curt Bears.  Paroxysmal atrial fibrillation on chronic anticoagulation: EKG reveals a paced rhythm at 69 bpm.  Patient has been compliant with Eliquis. -Continue Eliquis  Normocytic anemia: Chronic.  Hemoglobin 10.6 g/dL which appears near patient's baseline.  Dyslipidemia: Home medications include Crestor 10 mg daily and Zetia 10 mg daily -Continue home medication regimen  History of CVA -Continue aspirin, statin, and Eliquis  GERD -Continue home medication regimen of Pepcid 20 mg twice daily.  History of tobacco abuse: Patient reports quitting tobacco earlier this year. -Encouraged continued cessation of tobacco  DNR: Patient confirms that she would like to keep the DO NOT RESUSCITATE order in place   Discharge Instructions   Allergies as of 06/13/2020      Reactions  Shellfish-derived Products Anaphylaxis   Ativan [lorazepam] Other (See Comments)   Makes "skin crawl" and insomnia    Buspar [buspirone] Nausea Only   Sweating and dizzy   Clarithromycin Swelling   Codeine Nausea And Vomiting   Doxycycline Other (See Comments)   Unknown   Guaifenesin Other (See Comments)   Palpitations   Oxycodone Nausea And Vomiting   Prednisone Nausea And Vomiting, Other (See Comments)   Makes my heart race   Statins Other (See Comments)   Muscle pain   Sulfonamide Derivatives Nausea And Vomiting   Achiness   Tetracycline Nausea Only   Vicodin [hydrocodone-acetaminophen] Nausea And Vomiting   Famotidine Rash   Latex Rash   Omeprazole Nausea Only   Cough, shortness of breath - patient doesn't remember   Peanut-containing Drug Products Itching, Rash   Protonix [pantoprazole] Rash   Wellbutrin [bupropion] Palpitations      Medication List    TAKE these medications   acetaminophen 500 MG tablet Commonly known as: TYLENOL Take 1,000 mg by mouth 2 (two) times daily as needed (pain).   albuterol 108 (90 Base) MCG/ACT inhaler Commonly known as: VENTOLIN HFA Inhale 2 puffs into the lungs every 2 (two) hours as needed for wheezing or shortness of breath.   apixaban 5 MG Tabs tablet Commonly known as: Eliquis Take 1 tablet (5 mg total) by mouth 2 (two) times daily.   aspirin 81 MG chewable tablet Chew 1 tablet (81 mg total) by mouth daily.   carvedilol 12.5 MG tablet Commonly known as: COREG TAKE 1 TABLET (12.5 MG TOTAL) BY MOUTH 2 (TWO) TIMES DAILY WITH A MEAL. What changed: See the new instructions.   ezetimibe 10 MG tablet Commonly known as: ZETIA TAKE 1 TABLET BY MOUTH EVERY DAY   Magnesium 400 MG Tabs Take 400 mg by mouth 2 (two) times daily.   nitroGLYCERIN 0.4 MG SL tablet Commonly known as: NITROSTAT Place 1 tablet (0.4 mg total) under the tongue every 5 (five) minutes as needed for chest pain.   ondansetron 4 MG disintegrating tablet Commonly known as: Zofran ODT Take 1 tablet (4 mg total) by mouth every 8 (eight) hours as needed for nausea or  vomiting.   PEPCID PO Take 20 mg by mouth in the morning and at bedtime.   rosuvastatin 10 MG tablet Commonly known as: CRESTOR TAKE 1 TABLET (10 MG TOTAL) BY MOUTH DAILY AT 6 PM.   sertraline 50 MG tablet Commonly known as: ZOLOFT Take 1 tablet (50 mg total) by mouth at bedtime. To start medication- take 1/2 tablet at bedtime for 7 days then increase to 1 tablet at bedtime. What changed: additional instructions   torsemide 5 MG tablet Commonly known as: DEMADEX Take 1 tablet (5 mg total) by mouth every Monday, Wednesday, and Friday.   VITAMIN D3 PO Take 1,000 Units by mouth in the morning and at bedtime.       Follow-up Information    Rex Kras, DO Follow up on 07/05/2020.   Specialties: Cardiology, Radiology, Vascular Surgery Why: 1015am Contact information: [place] [address] [place], [place], [place]. Call in 2 day(s).   Specialties: Nurse Practitioner, Family Medicine Contact information: [address] [place] [address] [phone]              Allergies  Allergen Reactions  . Shellfish-Derived Products Anaphylaxis  . Ativan [Lorazepam] Other (See Comments)  Makes "skin crawl" and insomnia  . Buspar [Buspirone] Nausea Only    Sweating and dizzy  . Clarithromycin Swelling  . Codeine Nausea And Vomiting  . Doxycycline Other (See Comments)    Unknown  . Guaifenesin Other (See Comments)    Palpitations  . Oxycodone Nausea And Vomiting  . Prednisone Nausea And Vomiting and Other (See Comments)    Makes my heart race  . Statins Other (See Comments)    Muscle pain  . Sulfonamide Derivatives Nausea And Vomiting    Achiness  . Tetracycline Nausea Only  . Vicodin [Hydrocodone-Acetaminophen] Nausea And Vomiting  . Famotidine Rash  . Latex Rash  . Omeprazole Nausea Only    Cough, shortness of breath - patient doesn't remember  . Peanut-Containing Drug Products Itching and Rash  . Protonix  [Pantoprazole] Rash  . Wellbutrin [Bupropion] Palpitations    Consultations:  Cardiology  Procedures/Studies: DG Chest 2 View  Result Date: 06/12/2020 CLINICAL DATA:  Dyspnea EXAM: CHEST - 2 VIEW COMPARISON:  April 14, 2020 FINDINGS: The heart size and mediastinal contours are unchanged with mild cardiomegaly. Aortic knob calcifications are seen. A left-sided pacemaker seen with the lead tips in the right atrium and right ventricle. A trace right pleural effusion the pain. Mildly increased interstitial markings of seen at both lung bases as on prior exam. The visualized skeletal structures are unremarkable. IMPRESSION: Trace right pleural effusion. Increased interstitial markings at both lung bases which may be due to chronic lung changes Electronically Signed   By: Prudencio Pair M.D.   On: 06/12/2020 03:31     Subjective: Eager to go home  Discharge Exam: Vitals:   06/13/20 0358 06/13/20 0751  BP: (!) 124/59 132/68  Pulse: 70 70  Resp: 20 20  Temp: 98.3 F (36.8 C) 98.5 F (36.9 C)  SpO2: 92% 95%   Vitals:   06/12/20 2044 06/12/20 2350 06/13/20 0358 06/13/20 0751  BP: (!) 118/56 121/69 (!) 124/59 132/68  Pulse: 72 71 70 70  Resp: 18 18 20 20   Temp: 98.3 F (36.8 C) 98.3 F (36.8 C) 98.3 F (36.8 C) 98.5 F (36.9 C)  TempSrc: Oral Oral Oral Oral  SpO2: 91% 94% 92% 95%  Weight:   61.9 kg   Height:        General: Pt is alert, awake, not in acute distress Cardiovascular: RRR, S1/S2 +, no rubs, no gallops Respiratory: CTA bilaterally, no wheezing, no rhonchi Abdominal: Soft, NT, ND, bowel sounds + Extremities: no edema, no cyanosis   The results of significant diagnostics from this hospitalization (including imaging, microbiology, ancillary and laboratory) are listed below for reference.     Microbiology: Recent Results (from the past 240 hour(s))  Resp Panel by RT-PCR (Flu A&B, Covid) Nasopharyngeal Swab     Status: None   Collection Time: 06/12/20  7:44 AM    Specimen: Nasopharyngeal Swab; Nasopharyngeal(NP) swabs in vial transport medium  Result Value Ref Range Status   SARS Coronavirus 2 by RT PCR NEGATIVE NEGATIVE Final    Comment: (NOTE) SARS-CoV-2 target nucleic acids are NOT DETECTED.  The SARS-CoV-2 RNA is generally detectable in upper respiratory specimens during the acute phase of infection. The lowest concentration of SARS-CoV-2 viral copies this assay can detect is 138 copies/mL. A negative result does not preclude SARS-Cov-2 infection and should not be used as the sole basis for treatment or other patient management decisions. A negative result may occur with  improper specimen collection/handling, submission of specimen other than nasopharyngeal  swab, presence of viral mutation(s) within the areas targeted by this assay, and inadequate number of viral copies(<138 copies/mL). A negative result must be combined with clinical observations, patient history, and epidemiological information. The expected result is Negative.  Fact Sheet for Patients:  EntrepreneurPulse.com.au  Fact Sheet for Healthcare Providers:  IncredibleEmployment.be  This test is no t yet approved or cleared by the [place] FDA and  has been authorized for detection and/or diagnosis of SARS-CoV-2 by FDA under an Emergency Use Authorization (EUA). This EUA will remain  in effect (meaning this test can be used) for the duration of the COVID-19 declaration under Section 564(b)(1) of the Act, 21 U.S.C.section 360bbb-3(b)(1), unless the authorization is terminated  or revoked sooner.       Influenza A by PCR NEGATIVE NEGATIVE Final   Influenza B by PCR NEGATIVE NEGATIVE Final    Comment: (NOTE) The Xpert Xpress SARS-CoV-2/FLU/RSV plus assay is intended as an aid in the diagnosis of influenza from Nasopharyngeal swab specimens and should not be used as a sole basis for treatment. Nasal washings and aspirates are  unacceptable for Xpert Xpress SARS-CoV-2/FLU/RSV testing.  Fact Sheet for Patients: EntrepreneurPulse.com.au  Fact Sheet for Healthcare Providers: IncredibleEmployment.be  This test is not yet approved or cleared by the [place] FDA and has been authorized for detection and/or diagnosis of SARS-CoV-2 by FDA under an Emergency Use Authorization (EUA). This EUA will remain in effect (meaning this test can be used) for the duration of the COVID-19 declaration under Section 564(b)(1) of the Act, 21 U.S.C. section 360bbb-3(b)(1), unless the authorization is terminated or revoked.  Performed at [facility], [place] [address], [place], [place] [address]      Labs: BNP (last 3 results) Recent Labs    08/24/19 0512 04/14/20 0524 06/12/20 0600  BNP 617.4* 2,779.0* 4,403.4*   Basic Metabolic Panel: Recent Labs  Lab 06/12/20 0256 06/13/20 0234  NA 141 140  K 4.4 3.8  CL 108 103  CO2 24 26  GLUCOSE 124* 94  BUN 19 19  CREATININE 3.03* 2.91*  CALCIUM 8.9 8.7*  MG  --  2.3   Liver Function Tests: No results for input(s): AST, ALT, ALKPHOS, BILITOT, PROT, ALBUMIN in the last 168 hours. No results for input(s): LIPASE, AMYLASE in the last 168 hours. No results for input(s): AMMONIA in the last 168 hours. CBC: Recent Labs  Lab 06/12/20 0256  WBC 6.0  HGB 10.6*  HCT 32.4*  MCV 89.8  PLT 173   Cardiac Enzymes: No results for input(s): CKTOTAL, CKMB, CKMBINDEX, TROPONINI in the last 168 hours. BNP: Invalid input(s): POCBNP CBG: No results for input(s): GLUCAP in the last 168 hours. D-Dimer No results for input(s): DDIMER in the last 72 hours. Hgb A1c No results for input(s): HGBA1C in the last 72 hours. Lipid Profile No results for input(s): CHOL, HDL, LDLCALC, TRIG, CHOLHDL, LDLDIRECT in the last 72 hours. Thyroid function studies No results for input(s): TSH, T4TOTAL, T3FREE, THYROIDAB in the last 72 hours.  Invalid  input(s): FREET3 Anemia work up No results for input(s): VITAMINB12, FOLATE, FERRITIN, TIBC, IRON, RETICCTPCT in the last 72 hours. Urinalysis    Component Value Date/Time   COLORURINE YELLOW 04/23/2020 1252   APPEARANCEUR CLEAR 04/23/2020 1252   LABSPEC 1.015 04/23/2020 1252   PHURINE 6.0 04/23/2020 1252   GLUCOSEU NEGATIVE 04/23/2020 1252   HGBUR TRACE-INTACT (A) 04/23/2020 1252   HGBUR negative 03/01/2007 1207   BILIRUBINUR NEGATIVE 04/23/2020 1252   BILIRUBINUR  negative 03/26/2020 1543   KETONESUR NEGATIVE 04/23/2020 1252   PROTEINUR Negative 03/26/2020 1543   PROTEINUR NEGATIVE 08/14/2019 2345   UROBILINOGEN 0.2 04/23/2020 1252   NITRITE NEGATIVE 04/23/2020 1252   LEUKOCYTESUR NEGATIVE 04/23/2020 1252   Sepsis Labs Invalid input(s): PROCALCITONIN,  WBC,  LACTICIDVEN Microbiology Recent Results (from the past 240 hour(s))  Resp Panel by RT-PCR (Flu A&B, Covid) Nasopharyngeal Swab     Status: None   Collection Time: 06/12/20  7:44 AM   Specimen: Nasopharyngeal Swab; Nasopharyngeal(NP) swabs in vial transport medium  Result Value Ref Range Status   SARS Coronavirus 2 by RT PCR NEGATIVE NEGATIVE Final    Comment: (NOTE) SARS-CoV-2 target nucleic acids are NOT DETECTED.  The SARS-CoV-2 RNA is generally detectable in upper respiratory specimens during the acute phase of infection. The lowest concentration of SARS-CoV-2 viral copies this assay can detect is 138 copies/mL. A negative result does not preclude SARS-Cov-2 infection and should not be used as the sole basis for treatment or other patient management decisions. A negative result may occur with  improper specimen collection/handling, submission of specimen other than nasopharyngeal swab, presence of viral mutation(s) within the areas targeted by this assay, and inadequate number of viral copies(<138 copies/mL). A negative result must be combined with clinical observations, patient history, and  epidemiological information. The expected result is Negative.  Fact Sheet for Patients:  EntrepreneurPulse.com.au  Fact Sheet for Healthcare Providers:  IncredibleEmployment.be  This test is no t yet approved or cleared by the [place] FDA and  has been authorized for detection and/or diagnosis of SARS-CoV-2 by FDA under an Emergency Use Authorization (EUA). This EUA will remain  in effect (meaning this test can be used) for the duration of the COVID-19 declaration under Section 564(b)(1) of the Act, 21 U.S.C.section 360bbb-3(b)(1), unless the authorization is terminated  or revoked sooner.       Influenza A by PCR NEGATIVE NEGATIVE Final   Influenza B by PCR NEGATIVE NEGATIVE Final    Comment: (NOTE) The Xpert Xpress SARS-CoV-2/FLU/RSV plus assay is intended as an aid in the diagnosis of influenza from Nasopharyngeal swab specimens and should not be used as a sole basis for treatment. Nasal washings and aspirates are unacceptable for Xpert Xpress SARS-CoV-2/FLU/RSV testing.  Fact Sheet for Patients: EntrepreneurPulse.com.au  Fact Sheet for Healthcare Providers: IncredibleEmployment.be  This test is not yet approved or cleared by the [place] FDA and has been authorized for detection and/or diagnosis of SARS-CoV-2 by FDA under an Emergency Use Authorization (EUA). This EUA will remain in effect (meaning this test can be used) for the duration of the COVID-19 declaration under Section 564(b)(1) of the Act, 21 U.S.C. section 360bbb-3(b)(1), unless the authorization is terminated or revoked.  Performed at [facility], [place] [address] [place], [place] [address]    Time spent: 30 min  SIGNED:   Marylu Lund, MD  [facility] 06/13/2020, 2:02 PM  If 7PM-7AM, please contact night-coverage

## 2020-06-13 NOTE — Plan of Care (Signed)

## 2020-06-13 NOTE — Evaluation (Addendum)
Occupational Therapy Evaluation Patient Details Name: [name] MRN: [id number] DOB: 1951/04/01 Today's Date: 06/13/2020    History of Present Illness Pt is a 69 y/o female admitted secondary to increased SOB. Thought to be from CHF exacerbation. PMH includes CVA, a fib, CKD, s/p ICD, CHF, HTN, and R TKA.   Clinical Impression   Pt is Mod - Sup with ADLs and ADL mobility. No SOB (Oxygen sats >93% on RA throughout session) LOB or impaired coordination during functional tasks. All education completed and no further acute OT services indicated at this time.    Follow Up Recommendations  No OT follow up    Equipment Recommendations  None recommended by OT    Recommendations for Other Services       Precautions / Restrictions Precautions Precautions: Fall Restrictions Weight Bearing Restrictions: No      Mobility Bed Mobility Overal bed mobility: Modified Independent                  Transfers Overall transfer level: Needs assistance Equipment used: None Transfers: Sit to/from Stand Sit to Stand: Supervision         General transfer comment: Sup for safety    Balance                                           ADL either performed or assessed with clinical judgement   ADL Overall ADL's : Modified independent                                             Vision Baseline Vision/History: Wears glasses Patient Visual Report: No change from baseline       Perception     Praxis      Pertinent Vitals/Pain Pain Assessment: No/denies pain     Hand Dominance Right   Extremity/Trunk Assessment Upper Extremity Assessment Upper Extremity Assessment: Overall WFL for tasks assessed   Lower Extremity Assessment Lower Extremity Assessment: Defer to PT evaluation   Cervical / Trunk Assessment Cervical / Trunk Assessment: Normal   Communication Communication Communication: No difficulties   Cognition  Arousal/Alertness: Awake/alert Behavior During Therapy: WFL for tasks assessed/performed Overall Cognitive Status: Within Functional Limits for tasks assessed                                     General Comments       Exercises     Shoulder Instructions      Home Living Family/patient expects to be discharged to:: Private residence Living Arrangements: Spouse/significant other Available Help at Discharge: Family;Available 24 hours/day Type of Home: House Home Access: Stairs to enter [employer] of Steps: 2 Entrance Stairs-Rails: Right;Left Home Layout: Two level;Able to live on main level with bedroom/bathroom     Bathroom Shower/Tub: Tub/shower unit   Bathroom Toilet: Handicapped height Bathroom Accessibility: Yes How Accessible: Accessible via walker Home Equipment: [place] - 2 wheels;Cane - single point;Bedside commode;Shower seat   Additional Comments: 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; states her 3in1 will not fit in the tub      Prior Functioning/Environment Level of Independence: Independent  OT Problem List: Decreased activity tolerance      OT Treatment/Interventions:      OT Goals(Current goals can be found in the care plan section) Acute Rehab OT Goals Patient Stated Goal: to go home OT Goal Formulation: With patient  OT Frequency:     Barriers to D/C:            Co-evaluation              AM-PAC OT "6 Clicks" Daily Activity     Outcome Measure Help from another person eating meals?: None Help from another person taking care of personal grooming?: None Help from another person toileting, which includes using toliet, bedpan, or urinal?: None Help from another person bathing (including washing, rinsing, drying)?: None Help from another person to put on and taking off regular upper body clothing?: None Help from another person to put on and taking off regular lower body clothing?: None 6 Click Score: 24    End of Session    Activity Tolerance: Patient tolerated treatment well Patient left: in bed  OT Visit Diagnosis: Other abnormalities of gait and mobility (R26.89)                Time: 4585-9292 OT Time Calculation (min): 26 min Charges:  OT General Charges $OT Visit: 1 Visit OT Evaluation $OT Eval Low Complexity: 1 Low OT Treatments $Self Care/Home Management : 8-22 mins    Britt Bottom 06/13/2020, 1:20 PM

## 2020-06-13 NOTE — Consult Note (Signed)
CARDIOLOGY CONSULT NOTE  Patient ID: [name] MRN: [id number] DOB/AGE: Oct 28, 1950 69 y.o.  Admit date: 06/12/2020 Attending physician: Donne Hazel, MD Primary Physician:  Elby Beck, FNP Outpatient Cardiologist: Rex Kras, DO, [facility] Inpatient Cardiologist: Rex Kras, DO, [facility]  Chief complaint: Shortness of breath  HPI:  [name] is a 69 y.o. Caucasian female who presents with a chief complaint of " shortness of breath." Her past medical history and cardiovascular risk factors include: Hypertension, hyperlipidemia, former smoker, history of Arnold-Chiari malformation, history of VT/VF, history of torsades and cardiac arrest, nonischemic cardiomyopathy, peripheral vascular disease, paroxysmal atrial fibrillation, left bundle branch block, history of CVA.  She presents to the hospital with a chief complaint of shortness of breath.  Primary team has consulted [place] given her symptoms of shortness of breath and complex cardiac history referred there additional recommendations.  Patient states that prior to the admission she was short of breath 1 week prior to coming to the ER which she was attributing to doing more work around the holidays.  However, she noticed further worsening of shortness of breath at rest and felt as if something was stuck in her throat.  The symptoms did not improve and therefore presented to the hospital for further evaluation and management.  When EMS arrived per electronic medical records her oxygen saturations were 85% on room air she was placed on 4 L nasal cannula and her oxygen saturations have improved and she is requiring less oxygen today.  EKG showed atrial paced ventricular sensed rhythm without underlying injury pattern.  BNP reported to be 1301 (2779 on 04/14/2020), and high sensitive troponins 136-->179.  Chest x-ray noted trace right pleural effusion with increased interstitial markings similar to prior studies.  Serum creatinine on  admission 3.03 mg/dL.  Patient has been diuresed during this hospitalization and has a total net negative urine output of 1.3 L.  Today patient states that she feels well from a cardiovascular standpoint.  Her shortness of breath is improved significantly.  She states that the diuresis as well as breathing treatment has really helped her underlying shortness of breath.  Patient is wondering if she may have underlying pulmonary conditions given her extensive history of smoking and the fact she felt better with breathing treatments.  She is requesting a referral to pulmonary medicine.  ALLERGIES: Allergies  Allergen Reactions  . Shellfish-Derived Products Anaphylaxis  . Ativan [Lorazepam] Other (See Comments)    Makes "skin crawl" and insomnia  . Buspar [Buspirone] Nausea Only    Sweating and dizzy  . Clarithromycin Swelling  . Codeine Nausea And Vomiting  . Doxycycline Other (See Comments)    Unknown  . Guaifenesin Other (See Comments)    Palpitations  . Oxycodone Nausea And Vomiting  . Prednisone Nausea And Vomiting and Other (See Comments)    Makes my heart race  . Statins Other (See Comments)    Muscle pain  . Sulfonamide Derivatives Nausea And Vomiting    Achiness  . Tetracycline Nausea Only  . Vicodin [Hydrocodone-Acetaminophen] Nausea And Vomiting  . Famotidine Rash  . Latex Rash  . Omeprazole Nausea Only    Cough, shortness of breath - patient doesn't remember  . Peanut-Containing Drug Products Itching and Rash  . Protonix [Pantoprazole] Rash  . Wellbutrin [Bupropion] Palpitations    PAST MEDICAL HISTORY: Past Medical History:  Diagnosis Date  . Allergy   . Anxiety   . Arnold-Chiari malformation ([place])   . Arthritis   . CHF (  congestive heart failure) ([place])   . Chronic systolic heart failure ([place]) 11/03/2019  . Coronary artery disease   . Dyspnea   . Encounter for assessment of implantable cardioverter-defibrillator (ICD) 11/10/2019  . GERD (gastroesophageal reflux  disease)   . H. pylori infection    3 years ago  . H/O cardiac arrest    Hx of VT/Vfib arrest  . Hyperlipidemia   . ICD BiV Medtronic model [id number] Claria Quad CRT-D SureScan ICD 08/25/2019   . Incontinence   . Paroxysmal atrial fibrillation ([place]) 08/06/2019  . Pulmonary edema 2021  . Syncope and collapse    Per pt, denies passing out  . Tobacco use disorder   . Unspecified essential hypertension     PAST SURGICAL HISTORY: Past Surgical History:  Procedure Laterality Date  . APPENDECTOMY    . ARNOLD CHIARI SURGERY     neurocranial surgery  . BIV ICD INSERTION CRT-D N/A 08/25/2019   Procedure: BIV ICD INSERTION CRT-D;  Surgeon: Constance Haw, MD;  Location: [place] [facility];  Service: Cardiovascular;  Laterality: N/A;  . BLEPHAROPLASTY     Bil  . C-EYE SURGERY PROCEDURE    . CARDIAC CATHETERIZATION    . CHOLECYSTECTOMY    . EYE SURGERY    . heart failure    . LEFT HEART CATH AND CORONARY ANGIOGRAPHY N/A 08/04/2019   Procedure: LEFT HEART CATH AND CORONARY ANGIOGRAPHY;  Surgeon: Adrian Prows, MD;  Location: [place] [facility];  Service: Cardiovascular;  Laterality: N/A;  . MASS EXCISION Right 12/27/2013   Procedure: MINOR EXCISION OF RIGHT THUMB MUCOID CYST, DEBRIDEMENT OF INTERPHALANGEAL JOINT;  Surgeon: Cammie Sickle, MD;  Location: [place];  Service: Orthopedics;  Laterality: Right;  . mass on thumb  right  . TOTAL KNEE ARTHROPLASTY Right 02/17/2017  . TOTAL KNEE ARTHROPLASTY Right 02/17/2017   Procedure: TOTAL KNEE ARTHROPLASTY;  Surgeon: Melrose Nakayama, MD;  Location: [place];  Service: Orthopedics;  Laterality: Right;  . TUBAL LIGATION      FAMILY HISTORY: The patient family history includes Bone cancer in her mother; Cancer in her sister; Diabetes in her sister; Diverticulosis in her sister; Heart disease in her mother; Hypertension in her sister; Tuberculosis in her father.   SOCIAL HISTORY:  The patient  reports that she quit smoking about 10  months ago. Her smoking use included cigarettes. She has a 50.00 pack-year smoking history. She has never used smokeless tobacco. She reports that she does not drink alcohol and does not use drugs.  MEDICATIONS: Current Outpatient Medications  Medication Instructions  . acetaminophen (TYLENOL) 1,000 mg, Oral, 2 times daily PRN  . apixaban (ELIQUIS) 5 mg, Oral, 2 times daily  . aspirin 81 mg, Oral, Daily  . carvedilol (COREG) 12.5 MG tablet TAKE 1 TABLET (12.5 MG TOTAL) BY MOUTH 2 (TWO) TIMES DAILY WITH A MEAL.  [name] Cholecalciferol (VITAMIN D3 PO) 1,000 Units, Oral, 2 times daily  . ezetimibe (ZETIA) 10 MG tablet TAKE 1 TABLET BY MOUTH EVERY DAY  . Famotidine (PEPCID PO) 20 mg, Oral, 2 times daily  . Magnesium 400 mg, Oral, 2 times daily  . nitroGLYCERIN (NITROSTAT) 0.4 mg, Sublingual, Every 5 min PRN  . ondansetron (ZOFRAN ODT) 4 mg, Oral, Every 8 hours PRN  . rosuvastatin (CRESTOR) 10 mg, Oral, Daily-1800  . sertraline (ZOLOFT) 50 mg, Oral, Daily at bedtime, To start medication- take 1/2 tablet at bedtime for 7 days then increase to 1 tablet at bedtime.  [name]  torsemide (DEMADEX) 5 mg, Oral, Every M-W-F    Review of Systems  Constitutional: Negative for chills and fever.  HENT: Negative for hoarse voice and nosebleeds.   Eyes: Negative for discharge, double vision and pain.  Cardiovascular: Negative for chest pain, claudication, dyspnea on exertion, leg swelling, near-syncope, orthopnea, palpitations, paroxysmal nocturnal dyspnea and syncope.  Respiratory: Positive for shortness of breath (improved since admission). Negative for hemoptysis.   Musculoskeletal: Negative for muscle cramps and myalgias.  Gastrointestinal: Negative for abdominal pain, constipation, diarrhea, hematemesis, hematochezia, melena, nausea and vomiting.  Neurological: Negative for dizziness and light-headedness.    PHYSICAL EXAM: Vitals with BMI 06/13/2020 06/13/2020 06/12/2020  Height - - -  Weight - 136 lbs 7 oz -   BMI - 17.71 -  Systolic 165 790 383  Diastolic 68 59 69  Pulse 70 70 71     Intake/Output Summary (Last 24 hours) at 06/13/2020 0900 Last data filed at 06/13/2020 0400 Gross per 24 hour  Intake 240 ml  Output 1500 ml  Net -1260 ml    Net IO Since Admission: -1,260 mL [06/13/20 0900]  CONSTITUTIONAL: Appears older than stated age, hemodynamically stable, no acute distress.    SKIN: Skin is warm and dry. No rash noted. No cyanosis. No pallor. No jaundice HEAD: Normocephalic and atraumatic.  EYES: No scleral icterus MOUTH/THROAT: Moist oral membranes.  NECK: No JVD present. No thyromegaly noted.   LYMPHATIC: No visible cervical adenopathy.  CHEST Normal respiratory effort. No intercostal retractions.  BiV ICD noted left infraclavicular region. LUNGS: Clear to auscultation over the right posterior lung with expiratory wheezes noted on the left posterior lung.  No crackles noted. CARDIOVASCULAR: Regular, positive F3-O3, soft holosystolic murmur heard at the apex radiating to axilla, no gallops or rubs appreciated ABDOMINAL: Nonobese, soft, nontender, nondistended, positive bowel sounds in all 4 quadrants no apparent ascites.  EXTREMITIES: No bilateral peripheral edema. 2+ right femoral pulse, 1+ left femoral pulse, none palpable bilateral popliteal, dorsalis pedis or posterior tibial pulses.  Warm to touch bilaterally.  No discoloration or cyanosis present. HEMATOLOGIC: No significant bruising NEUROLOGIC: Oriented to person, place, and time. Nonfocal. Normal muscle tone.  PSYCHIATRIC: Normal mood and affect. Normal behavior. Cooperative  RADIOLOGY: DG Chest 2 View  Result Date: 06/12/2020 CLINICAL DATA:  Dyspnea EXAM: CHEST - 2 VIEW COMPARISON:  April 14, 2020 FINDINGS: The heart size and mediastinal contours are unchanged with mild cardiomegaly. Aortic knob calcifications are seen. A left-sided pacemaker seen with the lead tips in the right atrium and right ventricle. A trace  right pleural effusion the pain. Mildly increased interstitial markings of seen at both lung bases as on prior exam. The visualized skeletal structures are unremarkable. IMPRESSION: Trace right pleural effusion. Increased interstitial markings at both lung bases which may be due to chronic lung changes Electronically Signed   By: Prudencio Pair M.D.   On: 06/12/2020 03:31    LABORATORY DATA: Lab Results  Component Value Date   WBC 6.0 06/12/2020   HGB 10.6 (L) 06/12/2020   HCT 32.4 (L) 06/12/2020   MCV 89.8 06/12/2020   PLT 173 06/12/2020    Recent Labs  Lab 06/13/20 0234  NA 140  K 3.8  CL 103  CO2 26  BUN 19  CREATININE 2.91*  CALCIUM 8.7*  GLUCOSE 94    Lipid Panel     Component Value Date/Time   CHOL 114 09/21/2019 1416   TRIG 147 09/21/2019 1416   HDL 33 (L) 09/21/2019 1416  CHOLHDL 7.5 08/03/2019 0249   VLDL 26 08/03/2019 0249   LDLCALC 55 09/21/2019 1416    BNP (last 3 results) Recent Labs    08/24/19 0512 04/14/20 0524 06/12/20 0600  BNP 617.4* 2,779.0* 1,301.8*    HEMOGLOBIN A1C Lab Results  Component Value Date   HGBA1C 5.7 (H) 08/03/2019   MPG 116.89 08/03/2019    Cardiac Panel (last 3 results) Recent Labs    06/12/20 0537 06/12/20 0840  TROPONINIHS 136* 179*    TSH Recent Labs    08/23/19 1315 08/24/19 0942 12/07/19 1234  TSH 0.75 0.550 1.487      CARDIAC DATABASE: EKG: 06/12/2020: Atrial paced, ventricular sensed rhythm.  Left axis deviation, intraventricular conduction delay, lateral infarct, T wave inversions cannot rule out ischemia, no significant change compared to prior ECG.   Echocardiogram: 04/14/2020: 1. Left ventricular ejection fraction, by estimation, is 25 to 30%. The  left ventricle has severely decreased function. The left ventricle  demonstrates global hypokinesis. The left ventricular internal cavity size  was moderately to severely dilated.  Left ventricular diastolic function could not be evaluated.  2.  Right ventricular systolic function is normal. The right ventricular  size is normal. Tricuspid regurgitation signal is inadequate for assessing  PA pressure.  3. Left atrial size was mildly dilated.  4. A small pericardial effusion is present. The pericardial effusion is  anterior to the right ventricle and localized near the right atrium. Large  pleural effusion in the left lateral region.  5. The mitral valve is degenerative. Mild to moderate mitral valve  regurgitation. No evidence of mitral stenosis.  6. The aortic valve is tricuspid. Aortic valve regurgitation is not  visualized. Mild aortic valve sclerosis is present, with no evidence of  aortic valve stenosis.  7. The inferior vena cava is normal in size with <50% respiratory  variability, suggesting right atrial pressure of 8 mmHg.   Heart Catheterization: 08/04/19: LV: Global hypokinesis, upper limit of normal size, EF 25 to 30%. Left main: Normal. LAD: Mild diffuse disease. Proximal LAD has a 30 to 40% stenosis, mid segment has a 20 to 30% stenosis, scattered disease noted in the LAD. Brisk flow. Circumflex: Again scattered disease noted in the circumflex. Mid segment has at most a 30 to 40% stenosis which appears to be eccentric and calcified. RCA: Dominant. Mild diffuse disease again noted. Mid segment after the origin of RV branch has a 70 to 80% stenosis. Brisk flow is evident throughout the RCA. Impression: Findings consistent with nonischemic cardiomyopathy. Although she has significant disease in the right coronary artery, this does not explain her presentation with global hypokinesis, neither does it explain VF arrest as the lesion does not appear to be unstable.   Biventricular ICD implantation 08/25/2019: ICD BiV Medtronic model [id number] Claria Quad CRT-D SureScan ICD  Carotid duplex: 08/03/2019: Right Carotid: Velocities in the right ICA are consistent with a 1-39% stenosis. Left Carotid: Velocities in  the left ICA are consistent with a 1-39%  stenosis. Vertebrals: Bilateral vertebral arteries demonstrate antegrade flow. Subclavians: Normal flow hemodynamics were seen in bilateral subclavian arteries.   IMPRESSION & RECOMMENDATIONS: BRITTENEY AYOTTE is a 69 y.o. Caucasian female whose past medical history and cardiovascular risk factors include: Hypertension, hyperlipidemia, former smoker, history of Arnold-Chiari malformation, history of VT/VF, history of torsades and cardiac arrest, nonischemic cardiomyopathy, peripheral vascular disease, paroxysmal atrial fibrillation, left bundle branch block, history of CVA.  Shortness of breath: Multifactorial   Overall improving since diuresis and breathing  treatments.  Requiring less oxygen supplementation. Wean off oxygen as per protocol. Defer to primary team.   Patient received IV Lasix during her hospitalization and has diuresed a net UOP 1.3L as of this morning.  She takes torsemide 3 times a week at home given her underlying chronic kidney disease.  Overall euvolemic.  Recommend continuation of her home medications at time of discharge.  May consider inhalers given her expiratory wheezes on examination and possible component of COPD given her extensive history of smoking.  Will defer to primary team. I have asked her to reach out to her PCP for pulmonary medicine evaluation once discharged.  Elevated troponins most likely secondary to supply demand ischemia in the setting of shortness of breath, renal insufficiency, and hypoxia.  Troponins have not peaked.  Therefore would recommend checking 2 more troponins to make sure that they are trending down.  Patient is asymptomatic and does not have any chest pain.  EKG shows atrial paced ventricular sensed rhythm without underlying injury pattern.  Continue to monitor  Chronic heart failure with reduced EF:  Continue current medical therapy.  In the past patient has been on therapies,  Arni's, and spironolactone at different times but subsequent blood work as noted worsening renal function and symptoms of hypotension and dizziness.  She was also on hydralazine/Isordil combination but did not tolerate it secondary to feeling generalized tired and fatigue and requested discontinuation of medication.  She is on torsemide 5 mg 3 times a week at home.  Follows up with nephrology given her underlying chronic kidney disease.  Will defer diuretic management to nephrology for more continuity of care.  Strict I's and O's, daily weights recommended.  Fluid restrictions to less than 2 L/day.  Salt restriction to less than 1.5 g/day.  Patient has undergone extensive cardiovascular evaluation including echocardiogram, left heart catheterization.   Cardiomyopathy, suggestive of nonischemic: Management as above.  Paroxysmal atrial fibrillation:  Rate control: Coreg.    Rhythm control: None.  Thromboembolic prophylaxis currently on Eliquis.  Patient does not endorse any evidence of bleeding.  CHA2DS2VASc score: 7. Annual stroke risk: 9.6% (congestive heart failure, hypertension, age, history of stroke, medical history of peripheral vascular disease, and gender)  Long-term oral anticoagulation use:  Indication paroxysm otherwise she got to al atrial fibrillation.  Patient did not endorse any evidence of bleeding  Mixed hyperlipidemia: Currently not at goal.  Currently on Crestor and Zetia.  We will continue to monitor.  Patient does not endorse any evidence of myalgias.  History of CVA: Educated on the importance of secondary prevention.  Will defer further management to primary and neurology.  Former smoker: Educated on the importance of continued smoking cessation.  Time spent: 87 mins  Plan of care discussed with the attending physician, her daughter Karoline Caldwell [phone]), and a follow-up appointment has been made and depart has been updated.  Patient's  questions and concerns were addressed to her satisfaction. She voices understanding of the instructions provided during this encounter.   This note was created using a voice recognition software as a result there may be grammatical errors inadvertently enclosed that do not reflect the nature of this encounter. Every attempt is made to correct such errors.  Mechele Claude [facility]  Pager: [phone] Office: [phone] 06/13/2020, 9:00 AM

## 2020-06-13 NOTE — Plan of Care (Signed)
  Problem: Education: Goal: Ability to demonstrate management of disease process will improve 06/13/2020 1446 by Camillia Herter, RN Outcome: Adequate for Discharge 06/13/2020 0741 by Camillia Herter, RN Outcome: Progressing Goal: Ability to verbalize understanding of medication therapies will improve 06/13/2020 1446 by Camillia Herter, RN Outcome: Adequate for Discharge 06/13/2020 0741 by Camillia Herter, RN Outcome: Progressing Goal: Individualized Educational Video(s) 06/13/2020 1446 by Camillia Herter, RN Outcome: Adequate for Discharge 06/13/2020 0741 by Camillia Herter, RN Outcome: Progressing   Problem: Activity: Goal: Capacity to carry out activities will improve 06/13/2020 1446 by Camillia Herter, RN Outcome: Adequate for Discharge 06/13/2020 0741 by Camillia Herter, RN Outcome: Progressing   Problem: Cardiac: Goal: Ability to achieve and maintain adequate cardiopulmonary perfusion will improve 06/13/2020 1446 by Camillia Herter, RN Outcome: Adequate for Discharge 06/13/2020 0741 by Camillia Herter, RN Outcome: Progressing

## 2020-06-18 ENCOUNTER — Ambulatory Visit: Payer: Medicare HMO | Admitting: Cardiology

## 2020-06-20 ENCOUNTER — Other Ambulatory Visit: Payer: Self-pay | Admitting: Cardiology

## 2020-06-22 ENCOUNTER — Ambulatory Visit: Payer: Medicare HMO | Admitting: Gastroenterology

## 2020-06-22 ENCOUNTER — Encounter: Payer: Self-pay | Admitting: Gastroenterology

## 2020-06-22 VITALS — BP 142/76 | HR 84 | Ht 64.0 in | Wt 137.0 lb

## 2020-06-22 DIAGNOSIS — R1310 Dysphagia, unspecified: Secondary | ICD-10-CM

## 2020-06-22 DIAGNOSIS — K219 Gastro-esophageal reflux disease without esophagitis: Secondary | ICD-10-CM

## 2020-06-22 DIAGNOSIS — K580 Irritable bowel syndrome with diarrhea: Secondary | ICD-10-CM | POA: Diagnosis not present

## 2020-06-22 MED ORDER — PEPCID 20 MG PO TABS: 20.0000 mg | ORAL_TABLET | Freq: Two times a day (BID) | ORAL | 3 refills | Status: DC

## 2020-06-22 NOTE — Patient Instructions (Addendum)
You have been scheduled for a Barium Esophogram at [facility] (1st floor of the hospital) on 07/03/2020 at 10:30am . Please arrive 15 minutes prior to your appointment for registration. Make certain not to have anything to eat or drink 3 hours prior to your test. If you need to reschedule for any reason, please contact radiology at [phone] to do so. __________________________________________________________________ A barium swallow is an examination that concentrates on views of the esophagus. This tends to be a double contrast exam (barium and two liquids which, when combined, create a gas to distend the wall of the oesophagus) or single contrast (non-ionic iodine based). The study is usually tailored to your symptoms so a good history is essential. Attention is paid during the study to the form, structure and configuration of the esophagus, looking for functional disorders (such as aspiration, dysphagia, achalasia, motility and reflux) EXAMINATION You may be asked to change into a gown, depending on the type of swallow being performed. A radiologist and radiographer will perform the procedure. The radiologist will advise you of the type of contrast selected for your procedure and direct you during the exam. You will be asked to stand, sit or lie in several different positions and to hold a small amount of fluid in your mouth before being asked to swallow while the imaging is performed .In some instances you may be asked to swallow barium coated marshmallows to assess the motility of a solid food bolus. The exam can be recorded as a digital or video fluoroscopy procedure. POST PROCEDURE It will take 1-2 days for the barium to pass through your system. To facilitate this, it is important, unless otherwise directed, to increase your fluids for the next 24-48hrs and to resume your normal diet.  This test typically takes about 30 minutes to perform.   Take Fiberchoice 1-2 tablets  daily   Due to recent changes in healthcare laws, you may see the results of your imaging and laboratory studies on MyChart before your provider has had a chance to review them.  We understand that in some cases there may be results that are confusing or concerning to you. Not all laboratory results come back in the same time frame and the provider may be waiting for multiple results in order to interpret others.  Please give [place] 48 hours in order for your provider to thoroughly review all the results before contacting the office for clarification of your results.   I appreciate the  opportunity to care for you  Thank You   Harl Bowie , MD  __________________________________________________________________________________

## 2020-06-22 NOTE — Progress Notes (Signed)
[name]    [id number]    1950/11/23  Primary Care Physician:Gessner, Dalbert Batman, FNP (Inactive)  Referring Physician: Elby Beck, FNP No address on file   Chief complaint:  Diarrhea  HPI:  70 year old very pleasant female here for follow-up visit.  She recently was treated for C. difficile colitis.  She is no longer having severe diarrhea but her stool is still not formed.  She has completed oral vancomycin and Florastor probiotic She continues to have persistent GERD symptoms, regurgitation, loss of taste and decreased appetite  EGD November 29, 2015: Candida esophagitis otherwise normal exam Colonoscopy July 09, 2015: 9 Sessile polyps removed, adenomas  Outpatient Encounter Medications as of 06/22/2020  Medication Sig  . acetaminophen (TYLENOL) 500 MG tablet Take 1,000 mg by mouth 2 (two) times daily as needed (pain).  [name] albuterol (VENTOLIN HFA) 108 (90 Base) MCG/ACT inhaler Inhale 2 puffs into the lungs every 2 (two) hours as needed for wheezing or shortness of breath.  [name] apixaban (ELIQUIS) 5 MG TABS tablet Take 1 tablet (5 mg total) by mouth 2 (two) times daily.  [name] aspirin 81 MG chewable tablet Chew 1 tablet (81 mg total) by mouth daily.  . carvedilol (COREG) 12.5 MG tablet TAKE 1 TABLET (12.5 MG TOTAL) BY MOUTH 2 (TWO) TIMES DAILY WITH A MEAL. (Patient taking differently: Take 6.25 mg by mouth at bedtime.)  . Cholecalciferol (VITAMIN D3 PO) Take 1,000 Units by mouth in the morning and at bedtime.  [name] ezetimibe (ZETIA) 10 MG tablet TAKE 1 TABLET BY MOUTH EVERY DAY  . Famotidine (PEPCID PO) Take 20 mg by mouth in the morning and at bedtime.   . Magnesium 400 MG TABS Take 400 mg by mouth 2 (two) times daily.   . nitroGLYCERIN (NITROSTAT) 0.4 MG SL tablet Place 1 tablet (0.4 mg total) under the tongue every 5 (five) minutes as needed for chest pain.  [name] ondansetron (ZOFRAN ODT) 4 MG disintegrating tablet Take 1 tablet (4 mg total) by mouth every 8 (eight)  hours as needed for nausea or vomiting.  . rosuvastatin (CRESTOR) 10 MG tablet TAKE 1 TABLET (10 MG TOTAL) BY MOUTH DAILY AT 6 PM.  . sertraline (ZOLOFT) 50 MG tablet Take 1 tablet (50 mg total) by mouth at bedtime. To start medication- take 1/2 tablet at bedtime for 7 days then increase to 1 tablet at bedtime. (Patient taking differently: Take 50 mg by mouth at bedtime.)  . torsemide (DEMADEX) 5 MG tablet Take 1 tablet (5 mg total) by mouth every Monday, Wednesday, and Friday.   No facility-administered encounter medications on file as of 06/22/2020.    Allergies as of 06/22/2020 - Review Complete 06/22/2020  Allergen Reaction Noted  . Shellfish-derived products Anaphylaxis 11/19/2011  . Ativan [lorazepam] Other (See Comments) 08/14/2015  . Buspar [buspirone] Nausea Only 12/09/2011  . Clarithromycin Swelling 03/17/2013  . Codeine Nausea And Vomiting 09/07/2006  . Doxycycline Other (See Comments) 09/07/2006  . Guaifenesin Other (See Comments) 09/07/2006  . Oxycodone Nausea And Vomiting 02/09/2017  . Prednisone Nausea And Vomiting and Other (See Comments) 09/07/2006  . Statins Other (See Comments) 07/04/2015  . Sulfonamide derivatives Nausea And Vomiting 09/07/2006  . Tetracycline Nausea Only 09/07/2006  . Vicodin [hydrocodone-acetaminophen] Nausea And Vomiting 02/17/2017  . Famotidine Rash 09/07/2006  . Latex Rash   . Omeprazole Nausea Only 04/11/2015  . Peanut-containing drug products Itching and Rash 02/09/2017  . Protonix [pantoprazole] Rash 09/08/2019  .  Wellbutrin [bupropion] Palpitations 09/21/2017    Past Medical History:  Diagnosis Date  . Allergy   . Anxiety   . Arnold-Chiari malformation ([place])   . Arthritis   . CHF (congestive heart failure) ([place])   . Chronic systolic heart failure ([place]) 11/03/2019  . Coronary artery disease   . Dyspnea   . Encounter for assessment of implantable cardioverter-defibrillator (ICD) 11/10/2019  . GERD (gastroesophageal reflux disease)   .  H. pylori infection    3 years ago  . H/O cardiac arrest    Hx of VT/Vfib arrest  . Hyperlipidemia   . ICD BiV Medtronic model [id number] [place] Quad CRT-D SureScan ICD 08/25/2019   . Incontinence   . Paroxysmal atrial fibrillation ([place]) 08/06/2019  . Pulmonary edema 2021  . Syncope and collapse    Per pt, denies passing out  . Tobacco use disorder   . Unspecified essential hypertension     Past Surgical History:  Procedure Laterality Date  . APPENDECTOMY    . ARNOLD CHIARI SURGERY     neurocranial surgery  . BIV ICD INSERTION CRT-D N/A 08/25/2019   Procedure: BIV ICD INSERTION CRT-D;  Surgeon: Constance Haw, MD;  Location: [facility];  Service: Cardiovascular;  Laterality: N/A;  . BLEPHAROPLASTY     Bil  . C-EYE SURGERY PROCEDURE    . CARDIAC CATHETERIZATION    . CHOLECYSTECTOMY    . EYE SURGERY    . heart failure    . LEFT HEART CATH AND CORONARY ANGIOGRAPHY N/A 08/04/2019   Procedure: LEFT HEART CATH AND CORONARY ANGIOGRAPHY;  Surgeon: Adrian Prows, MD;  Location: [facility];  Service: Cardiovascular;  Laterality: N/A;  . MASS EXCISION Right 12/27/2013   Procedure: MINOR EXCISION OF RIGHT THUMB MUCOID CYST, DEBRIDEMENT OF INTERPHALANGEAL JOINT;  Surgeon: Cammie Sickle, MD;  Location: [facility];  Service: Orthopedics;  Laterality: Right;  . mass on thumb  right  . TOTAL KNEE ARTHROPLASTY Right 02/17/2017  . TOTAL KNEE ARTHROPLASTY Right 02/17/2017   Procedure: TOTAL KNEE ARTHROPLASTY;  Surgeon: Melrose Nakayama, MD;  Location: [facility];  Service: Orthopedics;  Laterality: Right;  . TUBAL LIGATION      Family History  Problem Relation Age of Onset  . Bone cancer Mother   . Heart disease Mother   . Cancer Sister        metastatic; unknown primary  . Diverticulosis Sister   . Tuberculosis Father   . Diabetes Sister   . Hypertension Sister   . Colon cancer Neg Hx   . Esophageal cancer Neg Hx   . Pancreatic cancer Neg Hx   . Stomach cancer  Neg Hx   . Liver disease Neg Hx   . Kidney disease Neg Hx   . Rectal cancer Neg Hx   . Pancreatic disease Neg Hx     Social History   Socioeconomic History  . Marital status: Married    Spouse name: Not on file  . Number of children: 2  . Years of education: Not on file  . Highest education level: Not on file  Occupational History  . Occupation: hair dresser  Tobacco Use  . Smoking status: Former Smoker    Packs/day: 1.00    Years: 50.00    Pack years: 50.00    Types: Cigarettes    Quit date: 08/08/2019    Years since quitting: 0.8  . Smokeless tobacco: Never Used  Vaping Use  . Vaping Use: Never  used  Substance and Sexual Activity  . Alcohol use: No    Alcohol/week: 0.0 standard drinks  . Drug use: No  . Sexual activity: Not on file  Other Topics Concern  . Not on file  Social History Narrative  . Not on file   Social Determinants of Health   Financial Resource Strain: Not on file  Food Insecurity: Not on file  Transportation Needs: Not on file  Physical Activity: Not on file  Stress: Not on file  Social Connections: Not on file  Intimate Partner Violence: Not on file      Review of systems: All other review of systems negative except as mentioned in the HPI.   Physical Exam: Vitals:   06/22/20 1422  BP: (!) 142/76  Pulse: 84   Body mass index is 23.52 kg/m. Gen:      No acute distress HEENT:  sclera anicteric Abd:      soft, non-tender; no palpable masses, no distension Ext:    No edema Neuro: alert and oriented x 3 Psych: normal mood and affect  Data Reviewed:  Reviewed labs, radiology imaging, old records and pertinent past GI work up   Assessment and Plan/Recommendations:  70 year old with multiple comorbidities, recent C. difficile colitis, GERD with complaints of worsening reflux symptoms with regurgitation, decreased appetite and loss of taste She is on chronic anticoagulation with Eliquis.  She hit her hand against a chair and  bruised, has a hematoma  We will proceed with barium esophagram to evaluate noninvasively, if any significant abnormality including severe esophagitis, hiatal hernia, stricture or neoplastic lesion.  Will consider EGD for further evaluation  Continue famotidine twice daily and antireflux measures  IBS diarrhea: Use Benefiber 1 tablespoon 3 times daily with meals to improve stool consistency and diarrhea  She is due for surveillance colonoscopy due to history of colon polyps, due to her significant upper GI symptoms we will hold off scheduling colonoscopy for now  Return in 2 to 3 months or sooner if needed   The patient was provided an opportunity to ask questions and all were answered. The patient agreed with the plan and demonstrated an understanding of the instructions.  Damaris Hippo , MD    CC: Elby Beck, FNP

## 2020-07-03 ENCOUNTER — Ambulatory Visit (HOSPITAL_COMMUNITY): Payer: Medicare HMO

## 2020-07-05 ENCOUNTER — Encounter: Payer: Self-pay | Admitting: Cardiology

## 2020-07-05 ENCOUNTER — Other Ambulatory Visit: Payer: Self-pay

## 2020-07-05 ENCOUNTER — Ambulatory Visit: Payer: Medicare HMO | Admitting: Cardiology

## 2020-07-05 VITALS — BP 120/51 | HR 72 | Temp 98.6°F | Resp 16 | Ht 64.0 in | Wt 138.0 lb

## 2020-07-05 DIAGNOSIS — I5022 Chronic systolic (congestive) heart failure: Secondary | ICD-10-CM | POA: Diagnosis not present

## 2020-07-05 DIAGNOSIS — I469 Cardiac arrest, cause unspecified: Secondary | ICD-10-CM | POA: Diagnosis not present

## 2020-07-05 DIAGNOSIS — Z7901 Long term (current) use of anticoagulants: Secondary | ICD-10-CM

## 2020-07-05 DIAGNOSIS — E782 Mixed hyperlipidemia: Secondary | ICD-10-CM

## 2020-07-05 DIAGNOSIS — I429 Cardiomyopathy, unspecified: Secondary | ICD-10-CM | POA: Diagnosis not present

## 2020-07-05 DIAGNOSIS — Z87891 Personal history of nicotine dependence: Secondary | ICD-10-CM

## 2020-07-05 DIAGNOSIS — Z8679 Personal history of other diseases of the circulatory system: Secondary | ICD-10-CM | POA: Diagnosis not present

## 2020-07-05 DIAGNOSIS — Z95810 Presence of automatic (implantable) cardiac defibrillator: Secondary | ICD-10-CM

## 2020-07-05 DIAGNOSIS — I10 Essential (primary) hypertension: Secondary | ICD-10-CM

## 2020-07-05 DIAGNOSIS — I447 Left bundle-branch block, unspecified: Secondary | ICD-10-CM

## 2020-07-05 DIAGNOSIS — I480 Paroxysmal atrial fibrillation: Secondary | ICD-10-CM | POA: Diagnosis not present

## 2020-07-05 DIAGNOSIS — I4901 Ventricular fibrillation: Secondary | ICD-10-CM

## 2020-07-05 DIAGNOSIS — Z8673 Personal history of transient ischemic attack (TIA), and cerebral infarction without residual deficits: Secondary | ICD-10-CM

## 2020-07-05 NOTE — Progress Notes (Signed)
[name] Date of Birth: 1951-04-08 MRN: [id number] Primary Care Provider:Gessner, Dalbert Batman, FNP (Inactive) Primary Cardiologist: Rex Kras, DO Electrophysiologist: Dr. Reggy Eye   Date: 07/05/20 Last Office Visit: 04/23/2020  Chief Complaint  Patient presents with  . Chronic HFrEF (heart failure with reduced ejection fraction  . Follow-up   HPI  [name] is a 70 y.o. female who presents to the office with a chief complaint of "management of  heart failure." Patient's past medical history and cardiac risk factors include: Hypertension, hyperlipidemia, former smoker, history of Arnold-Chiari malformation, history of VT/VF, history of torsades, cardiac arrest, nonischemic cardiomyopathy, peripheral vascular disease, paroxysmal atrial fibrillation, left bundle branch block, history of CVA.  Patient is accompanied by her daughter [name] at today's office visit.   Since last office visit patient was hospitalized from 06/12/2020 - 06/13/2020 for shortness of breath at [facility].  When EMS was called her oxygen saturation was 85% on room air and improved with 4 L of nasal cannula oxygen supplementation.  EKG showed atrial paced ventricular sensed rhythm.  BNP was 1301 (on 04/14/2020 it was 2779) and high sensitive troponins 136--> 179.  Chest x-ray noted right pleural effusion with increased interstitial markings similar to prior study.  Serum creatinine on admission was 3.03 mg/dL and patient was diuresed with IV diuretics with a negative urine output of at least 1.5 L.  Upon evaluation patient stated from a cardiovascular standpoint she was doing okay and her shortness of breath had improved.  Her shortness of breath was multifactorial and her elevated troponin was most likely secondary to supply demand ischemia given the respiratory distress and shortness of breath.  Given the expiratory wheezes on examination and his symptoms improvement with breathing treatments  recommended that she follows up with pulmonary medicine to see if she has underlying undiagnosed COPD/emphysema or asthma and if she would benefit from inhaler therapy.  Since discharge patient states that she is doing well.  She does not have any chest pain or shortness of breath.  Patient is currently seeing gastroenterology and was scheduled for a barium study but due to the weather conditions the study was postponed.  She also has an appointment next Thursday to see pulmonary medicine to be evaluated for possible COPD/emphysema given her history of extensive smoking and shortness of breath.  Patient currently is being followed by nephrology has an upcoming appointment in March 2022.  Last BiV ICD interrogation was performed on 05/25/2020.  Patient was noted to have mode switches suggestive of paroxysmal atrial fibrillation longest episode was 41 minutes.  0 VT/VF episodes.  And transthoracic impedance trends not suggestive of fluid accumulation.   ALLERGIES: Allergies  Allergen Reactions  . Shellfish-Derived Products Anaphylaxis  . Ativan [Lorazepam] Other (See Comments)    Makes "skin crawl" and insomnia  . Buspar [Buspirone] Nausea Only    Sweating and dizzy  . Clarithromycin Swelling  . Codeine Nausea And Vomiting  . Doxycycline Other (See Comments)    Unknown  . Guaifenesin Other (See Comments)    Palpitations  . Oxycodone Nausea And Vomiting  . Prednisone Nausea And Vomiting and Other (See Comments)    Makes my heart race  . Statins Other (See Comments)    Muscle pain  . Sulfonamide Derivatives Nausea And Vomiting    Achiness  . Tetracycline Nausea Only  . Vicodin [Hydrocodone-Acetaminophen] Nausea And Vomiting  . Famotidine Rash  . Latex Rash  . Omeprazole Nausea Only    Cough, shortness  of breath - patient doesn't remember  . Peanut-Containing Drug Products Itching and Rash  . Protonix [Pantoprazole] Rash  . Wellbutrin [Bupropion] Palpitations   MEDICATION LIST PRIOR  TO VISIT: Current Outpatient Medications on File Prior to Visit  Medication Sig Dispense Refill  . acetaminophen (TYLENOL) 500 MG tablet Take 1,000 mg by mouth 2 (two) times daily as needed (pain).    [name] albuterol (VENTOLIN HFA) 108 (90 Base) MCG/ACT inhaler Inhale 2 puffs into the lungs every 2 (two) hours as needed for wheezing or shortness of breath. 1 each 0  . apixaban (ELIQUIS) 5 MG TABS tablet Take 1 tablet (5 mg total) by mouth 2 (two) times daily. 180 tablet 3  . aspirin 81 MG chewable tablet Chew 1 tablet (81 mg total) by mouth daily. 30 tablet 0  . carvedilol (COREG) 12.5 MG tablet TAKE 1 TABLET (12.5 MG TOTAL) BY MOUTH 2 (TWO) TIMES DAILY WITH A MEAL. (Patient taking differently: Take 6.25 mg by mouth at bedtime.) 180 tablet 1  . Cholecalciferol (VITAMIN D3 PO) Take 1,000 Units by mouth in the morning and at bedtime.    [name] ezetimibe (ZETIA) 10 MG tablet TAKE 1 TABLET BY MOUTH EVERY DAY 90 tablet 1  . Magnesium 400 MG TABS Take 400 mg by mouth 2 (two) times daily.     . nitroGLYCERIN (NITROSTAT) 0.4 MG SL tablet Place 1 tablet (0.4 mg total) under the tongue every 5 (five) minutes as needed for chest pain. 30 tablet 3  . ondansetron (ZOFRAN ODT) 4 MG disintegrating tablet Take 1 tablet (4 mg total) by mouth every 8 (eight) hours as needed for nausea or vomiting. 20 tablet 1  . PEPCID 20 MG tablet Take 1 tablet (20 mg total) by mouth 2 (two) times daily. 60 tablet 3  . rosuvastatin (CRESTOR) 10 MG tablet TAKE 1 TABLET (10 MG TOTAL) BY MOUTH DAILY AT 6 PM. 90 tablet 1  . sertraline (ZOLOFT) 50 MG tablet Take 1 tablet (50 mg total) by mouth at bedtime. To start medication- take 1/2 tablet at bedtime for 7 days then increase to 1 tablet at bedtime. (Patient taking differently: Take 50 mg by mouth at bedtime.) 90 tablet 3  . torsemide (DEMADEX) 5 MG tablet Take 1 tablet (5 mg total) by mouth every Monday, Wednesday, and Friday. 30 tablet 3   No current facility-administered medications on file  prior to visit.    PAST MEDICAL HISTORY: Past Medical History:  Diagnosis Date  . Allergy   . Anxiety   . Arnold-Chiari malformation ([place])   . Arthritis   . CHF (congestive heart failure) ([place])   . Chronic systolic heart failure ([place]) 11/03/2019  . Coronary artery disease   . Dyspnea   . Encounter for assessment of implantable cardioverter-defibrillator (ICD) 11/10/2019  . GERD (gastroesophageal reflux disease)   . H. pylori infection    3 years ago  . H/O cardiac arrest    Hx of VT/Vfib arrest  . Hyperlipidemia   . ICD BiV Medtronic model [id number] Claria Quad CRT-D SureScan ICD 08/25/2019   . Incontinence   . Paroxysmal atrial fibrillation ([place]) 08/06/2019  . Pulmonary edema 2021  . Syncope and collapse    Per pt, denies passing out  . Tobacco use disorder   . Unspecified essential hypertension     PAST SURGICAL HISTORY: Past Surgical History:  Procedure Laterality Date  . APPENDECTOMY    . ARNOLD CHIARI SURGERY     neurocranial surgery  . BIV  ICD INSERTION CRT-D N/A 08/25/2019   Procedure: BIV ICD INSERTION CRT-D;  Surgeon: Constance Haw, MD;  Location: [facility];  Service: Cardiovascular;  Laterality: N/A;  . BLEPHAROPLASTY     Bil  . C-EYE SURGERY PROCEDURE    . CARDIAC CATHETERIZATION    . CHOLECYSTECTOMY    . EYE SURGERY    . heart failure    . LEFT HEART CATH AND CORONARY ANGIOGRAPHY N/A 08/04/2019   Procedure: LEFT HEART CATH AND CORONARY ANGIOGRAPHY;  Surgeon: Adrian Prows, MD;  Location: [facility];  Service: Cardiovascular;  Laterality: N/A;  . MASS EXCISION Right 12/27/2013   Procedure: MINOR EXCISION OF RIGHT THUMB MUCOID CYST, DEBRIDEMENT OF INTERPHALANGEAL JOINT;  Surgeon: Cammie Sickle, MD;  Location: [facility];  Service: Orthopedics;  Laterality: Right;  . mass on thumb  right  . TOTAL KNEE ARTHROPLASTY Right 02/17/2017  . TOTAL KNEE ARTHROPLASTY Right 02/17/2017   Procedure: TOTAL KNEE ARTHROPLASTY;  Surgeon:  Melrose Nakayama, MD;  Location: [facility];  Service: Orthopedics;  Laterality: Right;  . TUBAL LIGATION      FAMILY HISTORY: The patient family history includes Bone cancer in her mother; Cancer in her sister; Diabetes in her sister; Diverticulosis in her sister; Heart disease in her mother; Hypertension in her sister; Tuberculosis in her father.   SOCIAL HISTORY:  The patient  reports that she quit smoking about 10 months ago. Her smoking use included cigarettes. She has a 50.00 pack-year smoking history. She has never used smokeless tobacco. She reports that she does not drink alcohol and does not use drugs.  Review of Systems  Constitutional: Negative for chills, fever and weight loss.  HENT: Negative for hoarse voice and nosebleeds.   Eyes: Negative for discharge, double vision and pain.  Cardiovascular: Negative for chest pain, claudication, dyspnea on exertion, leg swelling, near-syncope, orthopnea, palpitations, paroxysmal nocturnal dyspnea and syncope.  Respiratory: Positive for shortness of breath (chronic and stable.). Negative for hemoptysis.   Musculoskeletal: Negative for muscle cramps and myalgias.  Gastrointestinal: Negative for abdominal pain, constipation, diarrhea, hematemesis, hematochezia, melena, nausea and vomiting.  Neurological: Negative for dizziness and light-headedness.   PHYSICAL EXAM: Vitals with BMI 07/05/2020 06/22/2020 06/13/2020  Height 5\' 4"  5\' 4"  -  Weight 138 lbs 137 lbs -  BMI 38.18 29.9 -  Systolic 371 696 789  Diastolic 51 76 68  Pulse 72 84 70   CONSTITUTIONAL: Appears older than stated age, hemodynamically stable, no acute distress.    SKIN: Skin is warm and dry. No rash noted. No cyanosis. No pallor. No jaundice HEAD: Normocephalic and atraumatic.  EYES: No scleral icterus MOUTH/THROAT: Moist oral membranes.  NECK: No JVD present. No thyromegaly noted.   LYMPHATIC: No visible cervical adenopathy.  CHEST Normal respiratory effort. No intercostal  retractions.  BiV ICD noted left infraclavicular region. LUNGS: Clear to auscultation bilaterally.  No stridor. No wheezes. No rales.  CARDIOVASCULAR: Regular, positive F8-B0, soft holosystolic murmur heard at the apex radiating to axilla, no gallops or rubs appreciated ABDOMINAL: Nonobese, soft, nontender, nondistended, positive bowel sounds in all 4 quadrants no apparent ascites.  EXTREMITIES: No bilateral peripheral edema. 2+ right femoral pulse, 1+ left femoral pulse, none palpable bilateral popliteal, dorsalis pedis or posterior tibial pulses.  Warm to touch bilaterally.  No discoloration or cyanosis present. HEMATOLOGIC: No significant bruising NEUROLOGIC: Oriented to person, place, and time. Nonfocal. Normal muscle tone.  PSYCHIATRIC: Normal mood and affect. Normal behavior. Cooperative  CARDIAC DATABASE: EKG: 08/12/2019: Normal sinus rhythm with ventricular rate of 61 bpm, left axis deviation, left bundle branch block, nonspecific T wave abnormalities.  Prior EKG dated 08/04/2019 shows a normal sinus rhythm, left axis deviation, left bundle branch block. 12/08/2019: Atrial paced,  Ventricular sensed rhythm. 07/05/2020: Normal sinus rhythm, 71 bpm, lateral infarct, diffuse T wave inversions in the precordial leads most likely secondary to pacing, without underlying injury pattern.  Similar findings on prior EKG dated 11/2019.  Echocardiogram: 02/28/2020:  Left ventricle cavity is normal in size. Moderate concentric hypertrophy of the left ventricle. Severe global hypokinesis with abnormal septal wall motion due to left bundle branch block. LVEF 30-35%. Doppler evidence of  grade I (impaired) diastolic dysfunction, normal LAP. Calculated EF 30%.  Trileaflet aortic valve. Trace aortic regurgitation.  Moderate to severe mitral regurgitation.  Mild tricuspid regurgitation. Estimated pulmonary artery systolic pressure 28 mmHg.  Compared to previous studies in 07/2019, LVEF is marginally improved  from  25-30%, but mitral regurgitation has worsened from mild.   Heart Catheterization: 08/04/19:  LV: Global hypokinesis, upper limit of normal size, EF 25 to 30%. Left main: Normal. LAD: Mild diffuse disease.  Proximal LAD has a 30 to 40% stenosis, mid segment has a 20 to 30% stenosis, scattered disease noted in the LAD.  Brisk flow. Circumflex: Again scattered disease noted in the circumflex.  Mid segment has at most a 30 to 40% stenosis which appears to be eccentric and calcified. RCA: Dominant.  Mild diffuse disease again noted.  Mid segment after the origin of RV branch has a 70 to 80% stenosis.  Brisk flow is evident throughout the RCA. Impression: Findings consistent with nonischemic cardiomyopathy.  Although she has significant disease in the right coronary artery, this does not explain her presentation with global hypokinesis, neither does it explain VF arrest as the lesion does not appear to be unstable.   Biventricular ICD implantation 08/25/2019: ICD BiV Medtronic model [id number] Claria Quad CRT-D SureScan ICD: Scheduled Remote ICD check  05/25/2020: 287 AHR episodes, review of EGMs demonstrates paroxysmal atrial fibrillation.  Longest 41 minutes.  1.9% cumulative atrial arrhythmia burden.  There were 0 VT/VF episodes. VSE correlates with A. fib with RVR. Trans-thoracic impedance trends and the Optivol Fluid Index does not suggests fluid accumulation. Battery longevity is 8 years. RA pacing is 95.5 %, RV pacing is 8.5 %, and LV pacing is 99.5 %.  Carotid duplex: 08/03/2019: Right Carotid: Velocities in the right ICA are consistent with a 1-39% stenosis. Left Carotid: Velocities in the left ICA are consistent with a 1-39%  stenosis. Vertebrals: Bilateral vertebral arteries demonstrate antegrade flow. Subclavians: Normal flow hemodynamics were seen in bilateral subclavian arteries.  LABORATORY DATA: CBC Latest Ref Rng & Units 06/12/2020 04/23/2020 04/14/2020  WBC 4.0 - 10.5 K/uL 6.0  5.4 -  Hemoglobin 12.0 - 15.0 g/dL 10.6(L) 10.7(L) 10.9(L)  Hematocrit 36.0 - 46.0 % 32.4(L) 32.8(L) 32.0(L)  Platelets 150 - 400 K/uL 173 223.0 -    CMP Latest Ref Rng & Units 06/13/2020 06/12/2020 04/23/2020  Glucose 70 - 99 mg/dL 94 124(H) 117(H)  BUN 8 - 23 mg/dL 19 19 32(H)  Creatinine 0.44 - 1.00 mg/dL 2.91(H) 3.03(H) 3.27(H)  Sodium 135 - 145 mmol/L 140 141 137  Potassium 3.5 - 5.1 mmol/L 3.8 4.4 4.0  Chloride 98 - 111 mmol/L 103 108 99  CO2 22 - 32 mmol/L 26 24 30   Calcium 8.9 - 10.3 mg/dL 8.7(L) 8.9 8.9  Total Protein 6.5 - 8.1  g/dL - - -  Total Bilirubin 0.3 - 1.2 mg/dL - - -  Alkaline Phos 38 - 126 U/L - - -  AST 15 - 41 U/L - - -  ALT 0 - 44 U/L - - -    Lipid Panel     Component Value Date/Time   CHOL 114 09/21/2019 1416   TRIG 147 09/21/2019 1416   HDL 33 (L) 09/21/2019 1416   CHOLHDL 7.5 08/03/2019 0249   VLDL 26 08/03/2019 0249   LDLCALC 55 09/21/2019 1416   LDLDIRECT 193.0 09/02/2016 0839   LABVLDL 26 09/21/2019 1416    Lab Results  Component Value Date   HGBA1C 5.7 (H) 08/03/2019   No components found for: NTPROBNP Lab Results  Component Value Date   TSH 1.487 12/07/2019   TSH 0.550 08/24/2019   TSH 0.75 08/23/2019    FINAL MEDICATION LIST END OF ENCOUNTER:   Current Outpatient Medications:  .  acetaminophen (TYLENOL) 500 MG tablet, Take 1,000 mg by mouth 2 (two) times daily as needed (pain)., Disp: , Rfl:  .  albuterol (VENTOLIN HFA) 108 (90 Base) MCG/ACT inhaler, Inhale 2 puffs into the lungs every 2 (two) hours as needed for wheezing or shortness of breath., Disp: 1 each, Rfl: 0 .  apixaban (ELIQUIS) 5 MG TABS tablet, Take 1 tablet (5 mg total) by mouth 2 (two) times daily., Disp: 180 tablet, Rfl: 3 .  aspirin 81 MG chewable tablet, Chew 1 tablet (81 mg total) by mouth daily., Disp: 30 tablet, Rfl: 0 .  carvedilol (COREG) 12.5 MG tablet, TAKE 1 TABLET (12.5 MG TOTAL) BY MOUTH 2 (TWO) TIMES DAILY WITH A MEAL. (Patient taking differently: Take  6.25 mg by mouth at bedtime.), Disp: 180 tablet, Rfl: 1 .  Cholecalciferol (VITAMIN D3 PO), Take 1,000 Units by mouth in the morning and at bedtime., Disp: , Rfl:  .  ezetimibe (ZETIA) 10 MG tablet, TAKE 1 TABLET BY MOUTH EVERY DAY, Disp: 90 tablet, Rfl: 1 .  Magnesium 400 MG TABS, Take 400 mg by mouth 2 (two) times daily. , Disp: , Rfl:  .  nitroGLYCERIN (NITROSTAT) 0.4 MG SL tablet, Place 1 tablet (0.4 mg total) under the tongue every 5 (five) minutes as needed for chest pain., Disp: 30 tablet, Rfl: 3 .  ondansetron (ZOFRAN ODT) 4 MG disintegrating tablet, Take 1 tablet (4 mg total) by mouth every 8 (eight) hours as needed for nausea or vomiting., Disp: 20 tablet, Rfl: 1 .  PEPCID 20 MG tablet, Take 1 tablet (20 mg total) by mouth 2 (two) times daily., Disp: 60 tablet, Rfl: 3 .  rosuvastatin (CRESTOR) 10 MG tablet, TAKE 1 TABLET (10 MG TOTAL) BY MOUTH DAILY AT 6 PM., Disp: 90 tablet, Rfl: 1 .  sertraline (ZOLOFT) 50 MG tablet, Take 1 tablet (50 mg total) by mouth at bedtime. To start medication- take 1/2 tablet at bedtime for 7 days then increase to 1 tablet at bedtime. (Patient taking differently: Take 50 mg by mouth at bedtime.), Disp: 90 tablet, Rfl: 3 .  torsemide (DEMADEX) 5 MG tablet, Take 1 tablet (5 mg total) by mouth every Monday, Wednesday, and Friday., Disp: 30 tablet, Rfl: 3  IMPRESSION:    ICD-10-CM   1. Chronic HFrEF (heart failure with reduced ejection fraction) (HCC)  I50.22 EKG 12-Lead  2. Cardiomyopathy  I42.9   3. Essential hypertension  I10   4. Paroxysmal atrial fibrillation (HCC)  I48.0   5. Long term current use of anticoagulant  Z79.01  6. Cardiac arrest with ventricular fibrillation (HCC)  I46.9    I49.01   7. Biventricular ICD (implantable cardioverter-defibrillator) in place  Z95.810   8. History of torsades de pointes  Z86.79   9. Left bundle branch block  I44.7   10. Mixed hyperlipidemia  E78.2   11. History of CVA (cerebrovascular accident)  Z54.73   12.  Former smoker  Z87.891      RECOMMENDATIONS: JENNIFERANN STUCKERT is a 70 y.o. female whose past medical history and cardiac risk factors include: Hypertension, hyperlipidemia, former smoker, history of Arnold-Chiari malformation, history of VT/VF, history of torsades, cardiac arrest, nonischemic cardiomyopathy, peripheral vascular disease, paroxysmal atrial fibrillation, left bundle branch block, history of CVA.  Chronic heart failure with reduced ejection fraction, stage C, NYHA class II:  Continue current medical therapy.  Unable to be on ARB's, Arni, Aldactone secondary to worsening renal function.  She was also on hydralazine/Isordil combination and was not able to tolerate it secondary to feeling tired, fatigued.  Informed both the patient and her daughter that her blood pressures can tolerate up titration of medical therapy but they don't want further titration as she feels more tired and fatigue.  Diuresis and currently managed by her nephrologist.  She is currently on torsemide.    Strict I's and O's, and daily weights recommended.  Fluid restriction to less than 2 L/day, sodium restriction to less than 1.5 g/day.  Cardiomyopathy, suggestive of nonischemic: See above  Status post BiV ICD status post ventricular fibrillation arrest:  Medtronic model [id number] Claria Quad CRT-D SureScan (serial Number [id number] )   Last device check reviewed with patient and daughter.   Left bundle branch block: Continue to monitor.  Paroxysmal atrial fibrillation:  Rate control: Coreg.    Rhythm control: None.  Thromboembolic prophylaxis currently on Eliquis.  Patient does not endorse any evidence of bleeding.  CHA2DS2VASc score: 7. Annual stroke risk: 9.6% (congestive heart failure, hypertension, age, history of stroke, medical history of peripheral vascular disease, and gender)  Long-term oral anticoagulation use:  Indication paroxysmal atrial fibrillation.  Patient did not endorse any  evidence of bleeding  Mixed hyperlipidemia:   Currently on Crestor and Zetia.  We will continue to monitor.  Patient does not endorse any evidence of myalgias.  History of CVA: Educated on the importance of secondary prevention.  Will defer further management to primary and neurology.  Former smoker: Educated on the importance of continued smoking cessation.  Orders Placed This Encounter  Procedures  . EKG 12-Lead   --Continue cardiac medications as reconciled in final medication list. --Return in about 3 months (around 10/03/2020) for Follow up, heart failure management... Or sooner if needed. --Continue follow-up with your primary care physician regarding the management of your other chronic comorbid conditions.  Patient's questions and concerns were addressed to her and her daughter's satisfaction. She and her daughter voices understanding of the instructions provided during this encounter.   This note was created using a voice recognition software as a result there may be grammatical errors inadvertently enclosed that do not reflect the nature of this encounter. Every attempt is made to correct such errors.  Total time spent: 32 minutes.   Rex Kras, [place], [facility]  Pager: [phone] Office: [phone]

## 2020-07-06 ENCOUNTER — Encounter: Payer: Self-pay | Admitting: Gastroenterology

## 2020-07-12 ENCOUNTER — Institutional Professional Consult (permissible substitution): Payer: Medicare HMO | Admitting: Emergency Medicine

## 2020-07-16 ENCOUNTER — Other Ambulatory Visit: Payer: Self-pay

## 2020-07-16 ENCOUNTER — Other Ambulatory Visit: Payer: Self-pay | Admitting: Gastroenterology

## 2020-07-16 ENCOUNTER — Ambulatory Visit (HOSPITAL_COMMUNITY)
Admission: RE | Admit: 2020-07-16 | Discharge: 2020-07-16 | Disposition: A | Discharge status: Home / Self Care | Payer: Medicare HMO | Source: Ambulatory Visit | Attending: Gastroenterology | Admitting: Gastroenterology

## 2020-07-16 DIAGNOSIS — K224 Dyskinesia of esophagus: Secondary | ICD-10-CM | POA: Diagnosis not present

## 2020-07-16 DIAGNOSIS — K580 Irritable bowel syndrome with diarrhea: Secondary | ICD-10-CM | POA: Insufficient documentation

## 2020-07-16 DIAGNOSIS — R1310 Dysphagia, unspecified: Secondary | ICD-10-CM

## 2020-07-18 ENCOUNTER — Encounter: Payer: Self-pay | Admitting: Internal Medicine

## 2020-07-18 ENCOUNTER — Other Ambulatory Visit: Payer: Self-pay

## 2020-07-18 ENCOUNTER — Ambulatory Visit: Payer: Medicare HMO | Admitting: Internal Medicine

## 2020-07-18 VITALS — BP 118/72 | HR 82 | Temp 97.2°F | Ht 64.0 in | Wt 137.6 lb

## 2020-07-18 DIAGNOSIS — Z87891 Personal history of nicotine dependence: Secondary | ICD-10-CM

## 2020-07-18 DIAGNOSIS — R0602 Shortness of breath: Secondary | ICD-10-CM | POA: Diagnosis not present

## 2020-07-18 DIAGNOSIS — I5020 Unspecified systolic (congestive) heart failure: Secondary | ICD-10-CM | POA: Diagnosis not present

## 2020-07-18 NOTE — Progress Notes (Signed)
[name]    [id number]    Nov 05, 1950  Primary Care Physician:Bedsole, Mervyn Gay, MD  Referring Physician: Jinny Sanders, MD [address] [place],   [address] Reason for Consultation: shortness of breath Date of Consultation: 07/18/2020  Chief complaint:   Chief Complaint  Patient presents with  . Consult    SOB started 07/2019 after stoke.     HPI: [name] is a 70 y.o. woman with a 50 pack year smoking history (quit 2021) who presents for new patient evaluation of shortness of breath.She has an additional history of ICM with HFrEF EF25% with h/o VF arrest s/p ICD, and stage IV CKD. Hospitalized end of December for shortness of breath felt to be secondary to COPD exacerbation vs CHF. She has shortness of breath that wakes her up in the middle of the night. She denies dyspnea when is sitting upright, during the day, or up walking around. Cold air bothers her sinuses. She denies cough. She has occasional wheezing when she is laying flat at night. She denies any apneas and daughter denies witness apneas. Her husband did have sleep apnea.  She denies recurrent pneumonia or bronchitis. She had an episode once many years ago.   She has been prescribed an albuterol inhaler after recent discharge from the hospital. She has woken up in the middle of the night to take albuterol 3 times and notes that it does help.   She feels her symptoms are related to food - she notes early satiety and heart burn. She does have a hiatal hernia and some food getting stuck on her esophagus. She has ongoing heartburn not relieved with pepcid. She had a rash to protonix/prilosec while inpatient.  She took an anti-nausea medication recently and that helped her a lot. She notes frequent belching and stomach discomfort which she feels are her primary issues waking her up at night.    Social History   Occupational History  . Occupation: hair dresser  Tobacco Use  . Smoking status:  Former Smoker    Packs/day: 1.00    Years: 50.00    Pack years: 50.00    Types: Cigarettes    Quit date: 08/08/2019    Years since quitting: 0.9  . Smokeless tobacco: Never Used  Vaping Use  . Vaping Use: Never used  Substance and Sexual Activity  . Alcohol use: No    Alcohol/week: 0.0 standard drinks  . Drug use: No  . Sexual activity: Not on file    Relevant family history:  Family History  Problem Relation Age of Onset  . Bone cancer Mother   . Heart disease Mother   . Cancer Sister        metastatic; unknown primary  . Diverticulosis Sister   . Tuberculosis Father   . Diabetes Sister   . Hypertension Sister   . Colon cancer Neg Hx   . Esophageal cancer Neg Hx   . Pancreatic cancer Neg Hx   . Stomach cancer Neg Hx   . Liver disease Neg Hx   . Kidney disease Neg Hx   . Rectal cancer Neg Hx   . Pancreatic disease Neg Hx     Past Medical History:  Diagnosis Date  . Allergy   . Anxiety   . Arnold-Chiari malformation ([place])   . Arthritis   . CHF (congestive heart failure) ([place])   . Chronic systolic heart failure ([facility]) 11/03/2019  . Coronary  artery disease   . Dyspnea   . Encounter for assessment of implantable cardioverter-defibrillator (ICD) 11/10/2019  . GERD (gastroesophageal reflux disease)   . H. pylori infection    3 years ago  . H/O cardiac arrest    Hx of VT/Vfib arrest  . Hyperlipidemia   . ICD BiV Medtronic model [id number] [place] Quad CRT-D SureScan ICD 08/25/2019   . Incontinence   . Paroxysmal atrial fibrillation ([place] [facility]) 08/06/2019  . Pulmonary edema 2021  . Syncope and collapse    Per pt, denies passing out  . Tobacco use disorder   . Unspecified essential hypertension     Past Surgical History:  Procedure Laterality Date  . APPENDECTOMY    . ARNOLD CHIARI SURGERY     neurocranial surgery  . BIV ICD INSERTION CRT-D N/A 08/25/2019   Procedure: BIV ICD INSERTION CRT-D;  Surgeon: Constance Haw, MD;  Location: [facility];  Service:  Cardiovascular;  Laterality: N/A;  . BLEPHAROPLASTY     Bil  . C-EYE SURGERY PROCEDURE    . CARDIAC CATHETERIZATION    . CHOLECYSTECTOMY    . EYE SURGERY    . heart failure    . LEFT HEART CATH AND CORONARY ANGIOGRAPHY N/A 08/04/2019   Procedure: LEFT HEART CATH AND CORONARY ANGIOGRAPHY;  Surgeon: Adrian Prows, MD;  Location: [facility];  Service: Cardiovascular;  Laterality: N/A;  . MASS EXCISION Right 12/27/2013   Procedure: MINOR EXCISION OF RIGHT THUMB MUCOID CYST, DEBRIDEMENT OF INTERPHALANGEAL JOINT;  Surgeon: Cammie Sickle, MD;  Location: [facility];  Service: Orthopedics;  Laterality: Right;  . mass on thumb  right  . TOTAL KNEE ARTHROPLASTY Right 02/17/2017  . TOTAL KNEE ARTHROPLASTY Right 02/17/2017   Procedure: TOTAL KNEE ARTHROPLASTY;  Surgeon: Melrose Nakayama, MD;  Location: [facility];  Service: Orthopedics;  Laterality: Right;  . TUBAL LIGATION       Physical Exam: Blood pressure 118/72, pulse 82, temperature (!) 97.2 F (36.2 C), temperature source Oral, height 5\' 4"  (1.626 m), weight 137 lb 9.6 oz (62.4 kg), SpO2 96 %. Gen:      No acute distress ENT:  no nasal polyps, mucus membranes moist Lungs:    No increased respiratory effort, symmetric chest wall excursion, clear to auscultation bilaterally, no wheezes or crackles CV:         Regular rate and rhythm; no murmurs, rubs, or gallops.  No pedal edema Abd:      + bowel sounds; soft, non-tender; no distension MSK: no acute synovitis of DIP or PIP joints, no mechanics hands.  Skin:      Warm and dry; no rashes Neuro: normal speech, no focal facial asymmetry Psych: alert and oriented x3, normal mood and affect   Data Reviewed/Medical Decision Making:  Independent interpretation of tests: Imaging: . Review of patient's chest ray Dec 2021 images revealed bilateral chronic interstitial changes consistent with COPD. The patient's images have been independently reviewed by me.    PFTs: None on  file  Labs:  Lab Results  Component Value Date   WBC 6.0 06/12/2020   HGB 10.6 (L) 06/12/2020   HCT 32.4 (L) 06/12/2020   MCV 89.8 06/12/2020   PLT 173 06/12/2020   Lab Results  Component Value Date   NA 140 06/13/2020   K 3.8 06/13/2020   CL 103 06/13/2020   CO2 26 06/13/2020     Echocardiogram October 2021 1. Left ventricular ejection fraction, by estimation, is 25  to 30%. The  left ventricle has severely decreased function. The left ventricle  demonstrates global hypokinesis. The left ventricular internal cavity size  was moderately to severely dilated.  Left ventricular diastolic function could not be evaluated.  2. Right ventricular systolic function is normal. The right ventricular  size is normal. Tricuspid regurgitation signal is inadequate for assessing  PA pressure.  3. Left atrial size was mildly dilated.  4. A small pericardial effusion is present. The pericardial effusion is  anterior to the right ventricle and localized near the right atrium. Large  pleural effusion in the left lateral region.  5. The mitral valve is degenerative. Mild to moderate mitral valve  regurgitation. No evidence of mitral stenosis.  6. The aortic valve is tricuspid. Aortic valve regurgitation is not  visualized. Mild aortic valve sclerosis is present, with no evidence of  aortic valve stenosis.  7. The inferior vena cava is normal in size with <50% respiratory  variability, suggesting right atrial pressure of 8 mmHg.   Immunization status:  Immunization History  Administered Date(s) Administered  . Influenza Split 07/01/2011  . PFIZER(Purple Top)SARS-COV-2 Vaccination 02/16/2020, 03/08/2020  . Zoster 12/22/2011    . I reviewed prior external note(s) from hospital stay . I reviewed the result(s) of the labs and imaging as noted above.  . I have ordered PFTs  Assessment:  Shortness of breath/nocturnal awakenings History of tobacco use disorder quit 2021, 50 pack  years Esophageal Dysmotility GERD   Plan/Recommendations: [name] has nocturnal awakenings with dyspnea and wheezing that she feels are relieved by albuterol. She has no dyspnea, chest tightness, cough or wheezing during the day, even with exertion. It would be unusual for smoking related lung disease to present this way. She could be having some placebo effect with albuterol.  Will obtain a full set of PFTs.  I will see her back after this.   I am very curious to hear what her GI doctor may think about these nocturnal awakenings being related to reflux or dysmotility.   We discussed disease management and progression at length today.    Return to Care: Return in about 4 weeks (around 08/15/2020).  Lenice Llamas, MD Pulmonary and [place]  CC: Jinny Sanders, MD

## 2020-07-18 NOTE — Patient Instructions (Addendum)
The patient should have follow up scheduled with myself after next available PFTs.   Prior to next visit patient should have: Full set of PFTs - one hour

## 2020-07-19 ENCOUNTER — Ambulatory Visit: Payer: Medicare HMO | Admitting: Cardiology

## 2020-07-25 ENCOUNTER — Ambulatory Visit: Payer: Medicare HMO | Admitting: Family Medicine

## 2020-07-26 ENCOUNTER — Other Ambulatory Visit: Payer: Self-pay

## 2020-07-27 ENCOUNTER — Encounter: Payer: Self-pay | Admitting: Family Medicine

## 2020-07-27 ENCOUNTER — Other Ambulatory Visit: Payer: Self-pay

## 2020-07-27 ENCOUNTER — Ambulatory Visit (INDEPENDENT_AMBULATORY_CARE_PROVIDER_SITE_OTHER): Payer: Medicare HMO | Admitting: Family Medicine

## 2020-07-27 ENCOUNTER — Other Ambulatory Visit: Payer: Self-pay | Admitting: Family Medicine

## 2020-07-27 VITALS — BP 120/60 | HR 74 | Temp 98.4°F | Ht 64.0 in | Wt 140.0 lb

## 2020-07-27 DIAGNOSIS — I428 Other cardiomyopathies: Secondary | ICD-10-CM

## 2020-07-27 DIAGNOSIS — E782 Mixed hyperlipidemia: Secondary | ICD-10-CM

## 2020-07-27 DIAGNOSIS — K219 Gastro-esophageal reflux disease without esophagitis: Secondary | ICD-10-CM | POA: Diagnosis not present

## 2020-07-27 DIAGNOSIS — E538 Deficiency of other specified B group vitamins: Secondary | ICD-10-CM

## 2020-07-27 DIAGNOSIS — N1832 Chronic kidney disease, stage 3b: Secondary | ICD-10-CM | POA: Diagnosis not present

## 2020-07-27 DIAGNOSIS — K529 Noninfective gastroenteritis and colitis, unspecified: Secondary | ICD-10-CM

## 2020-07-27 DIAGNOSIS — E559 Vitamin D deficiency, unspecified: Secondary | ICD-10-CM

## 2020-07-27 DIAGNOSIS — I5023 Acute on chronic systolic (congestive) heart failure: Secondary | ICD-10-CM | POA: Diagnosis not present

## 2020-07-27 DIAGNOSIS — I10 Essential (primary) hypertension: Secondary | ICD-10-CM

## 2020-07-27 DIAGNOSIS — F4323 Adjustment disorder with mixed anxiety and depressed mood: Secondary | ICD-10-CM

## 2020-07-27 DIAGNOSIS — R69 Illness, unspecified: Secondary | ICD-10-CM | POA: Diagnosis not present

## 2020-07-27 DIAGNOSIS — R0602 Shortness of breath: Secondary | ICD-10-CM

## 2020-07-27 LAB — COMPREHENSIVE METABOLIC PANEL
ALT: 5 U/L (ref 0–35)
AST: 14 U/L (ref 0–37)
Albumin: 4.1 g/dL (ref 3.5–5.2)
Alkaline Phosphatase: 48 U/L (ref 39–117)
BUN: 16 mg/dL (ref 6–23)
CO2: 32 mEq/L (ref 19–32)
Calcium: 9.2 mg/dL (ref 8.4–10.5)
Chloride: 103 mEq/L (ref 96–112)
Creatinine, Ser: 2.59 mg/dL — ABNORMAL HIGH (ref 0.40–1.20)
GFR: 18.28 mL/min — ABNORMAL LOW (ref >60.00)
Glucose, Bld: 105 mg/dL — ABNORMAL HIGH (ref 70–99)
Potassium: 4.1 mEq/L (ref 3.5–5.1)
Sodium: 140 mEq/L (ref 135–145)
Total Bilirubin: 0.4 mg/dL (ref 0.2–1.2)
Total Protein: 6.9 g/dL (ref 6.0–8.3)

## 2020-07-27 LAB — VITAMIN B12: Vitamin B-12: 784 pg/mL (ref 211–911)

## 2020-07-27 LAB — VITAMIN D 25 HYDROXY (VIT D DEFICIENCY, FRACTURES): VITD: 40.62 ng/mL (ref 30.00–100.00)

## 2020-07-27 MED ORDER — ONDANSETRON 4 MG PO TBDP: 4.0000 mg | ORAL_TABLET | Freq: Three times a day (TID) | ORAL | 1 refills | Status: DC | PRN

## 2020-07-27 NOTE — Progress Notes (Signed)
Patient ID: [name], female    DOB: 07-19-1950, 70 y.o.   MRN: [id number]  This visit was conducted in person.  BP 120/60   Pulse 74   Temp 98.4 F (36.9 C) (Temporal)   Ht 5\' 4"  (1.626 m)   Wt 140 lb (63.5 kg)   SpO2 91%   BMI 24.03 kg/m    CC:  Chief Complaint  Patient presents with  . Establish Care    TOC from [facility]. [facility]    Subjective:   HPI: Janice KUEHNEL is a 70 y.o. female presenting on 07/27/2020 for Establish Care (TOC from D. Carle[facility])   Daughter: [name] is present with her.  1 year ago had TIA... 2 days later went into cardiac arrest.   has now had defibrillator , pacemaker placed.  She is seeing Dr. Silverio Decamp for chronic diarrhea... Cdiff  Few months agonow treated.  Has small  hiatal hernia.. had barium swallow showed  Stricture. Allergic to protonix, prilosecI.[name] using pepcid AC BID, Tums.   Since last office visit patient was hospitalized from 06/12/2020 - 06/13/2020 for shortness of breath at [facility].  When EMS was called her oxygen saturation was 85% on room air and improved with 4 L of nasal cannula oxygen supplementation.  EKG showed atrial paced ventricular sensed rhythm.  BNP was 1301 (on 04/14/2020 it was 2779) and high sensitive troponins 136--> 179.  Chest x-ray noted right pleural effusion with increased interstitial markings similar to prior study.  Serum creatinine on admission was 3.03 mg/dL and patient was diuresed with IV diuretics with a negative urine output of at least 1.5 L. HTN,Hx of TIA, paroxsysmal afib, cardiac arrest with vfib, cardiomyopathy   CHF reduced fraction, nonischemic cardiomyopthy:  Using torsemide  MWF prn. Occ wakes up at night wheezing, but no DOE.  Paroxsysmal afib:    Rate control: Coreg.    Rhythm control: None.  Thromboembolic prophylaxis currently on Eliquis   High cholesterol: On crestor and zetia  Followed by  Dr. Terri Skains.  Last OV reviewed 06/2020   CKD due to  Decreased blood flow when  she had cardiac arrest.:  GFR 13.. Dr. Ronald Lobo   B12 due for re-eval.  Vit D low in past... now on supplement.   Dr. Shearon Stalls, pulmonologist.. planning pulmonary function test to eval SOB at night.  Felt most likely  Due to GI issues    Adjustment disorder due to recent health issues: Now improved on sertraline. Insomnia not well controlled given  Waking up to urinate a lot.    Hx of arnold chiari malformation, S/P repair, 1992: no issues now except some balance issues.  Relevant past medical, surgical, family and social history reviewed and updated as indicated. Interim medical history since our last visit reviewed. Allergies and medications reviewed and updated. Outpatient Medications Prior to Visit  Medication Sig Dispense Refill  . acetaminophen (TYLENOL) 500 MG tablet Take 1,000 mg by mouth 2 (two) times daily as needed (pain).    [name] albuterol (VENTOLIN HFA) 108 (90 Base) MCG/ACT inhaler Inhale 2 puffs into the lungs every 2 (two) hours as needed for wheezing or shortness of breath. 1 each 0  . apixaban (ELIQUIS) 5 MG TABS tablet Take 1 tablet (5 mg total) by mouth 2 (two) times daily. 180 tablet 3  . aspirin 81 MG chewable tablet Chew 1 tablet (81 mg total) by mouth daily. 30 tablet 0  . carvedilol (COREG) 12.5 MG tablet Take 6.25 mg  by mouth 2 (two) times daily with a meal.    . Cholecalciferol (VITAMIN D3 PO) Take 1,000 Units by mouth in the morning and at bedtime.    [name] ezetimibe (ZETIA) 10 MG tablet TAKE 1 TABLET BY MOUTH EVERY DAY 90 tablet 1  . Magnesium 400 MG TABS Take 400 mg by mouth 2 (two) times daily.     . ondansetron (ZOFRAN ODT) 4 MG disintegrating tablet Take 1 tablet (4 mg total) by mouth every 8 (eight) hours as needed for nausea or vomiting. 20 tablet 1  . PEPCID 20 MG tablet Take 1 tablet (20 mg total) by mouth 2 (two) times daily. 60 tablet 3  . rosuvastatin (CRESTOR) 10 MG tablet TAKE 1 TABLET (10 MG TOTAL) BY MOUTH DAILY AT 6 PM. 90 tablet 1  . sertraline  (ZOLOFT) 50 MG tablet Take 50 mg by mouth at bedtime.    . torsemide (DEMADEX) 5 MG tablet Take 1 tablet (5 mg total) by mouth every Monday, Wednesday, and Friday. 30 tablet 3  . carvedilol (COREG) 12.5 MG tablet TAKE 1 TABLET (12.5 MG TOTAL) BY MOUTH 2 (TWO) TIMES DAILY WITH A MEAL. (Patient taking differently: Take 6.25 mg by mouth at bedtime.) 180 tablet 1  . sertraline (ZOLOFT) 50 MG tablet Take 1 tablet (50 mg total) by mouth at bedtime. To start medication- take 1/2 tablet at bedtime for 7 days then increase to 1 tablet at bedtime. (Patient taking differently: Take 50 mg by mouth at bedtime.) 90 tablet 3  . nitroGLYCERIN (NITROSTAT) 0.4 MG SL tablet Place 1 tablet (0.4 mg total) under the tongue every 5 (five) minutes as needed for chest pain. 30 tablet 3   No facility-administered medications prior to visit.     Per HPI unless specifically indicated in ROS section below Review of Systems  Constitutional: Negative for fatigue and fever.  HENT: Negative for congestion.   Eyes: Negative for pain.  Respiratory: Positive for shortness of breath. Negative for cough.   Cardiovascular: Negative for chest pain, palpitations and leg swelling.  Gastrointestinal: Negative for abdominal pain.  Genitourinary: Negative for dysuria and vaginal bleeding.  Musculoskeletal: Negative for back pain.  Neurological: Negative for syncope, light-headedness and headaches.  Psychiatric/Behavioral: Negative for dysphoric mood.   Objective:  BP 120/60   Pulse 74   Temp 98.4 F (36.9 C) (Temporal)   Ht 5\' 4"  (1.626 m)   Wt 140 lb (63.5 kg)   SpO2 91%   BMI 24.03 kg/m   Wt Readings from Last 3 Encounters:  07/27/20 140 lb (63.5 kg)  07/18/20 137 lb 9.6 oz (62.4 kg)  07/05/20 138 lb (62.6 kg)      Physical Exam Constitutional:      General: She is not in acute distress.    Appearance: Normal appearance. She is well-developed. She is not ill-appearing or toxic-appearing.  HENT:     Head:  Normocephalic.     Right Ear: Hearing, tympanic membrane, ear canal and external ear normal. Tympanic membrane is not erythematous, retracted or bulging.     Left Ear: Hearing, tympanic membrane, ear canal and external ear normal. Tympanic membrane is not erythematous, retracted or bulging.     Nose: No mucosal edema or rhinorrhea.     Right Sinus: No maxillary sinus tenderness or frontal sinus tenderness.     Left Sinus: No maxillary sinus tenderness or frontal sinus tenderness.     Mouth/Throat:     Pharynx: Uvula midline.  Eyes:  General: Lids are normal. Lids are everted, no foreign bodies appreciated.     Conjunctiva/sclera: Conjunctivae normal.     Pupils: Pupils are equal, round, and reactive to light.  Neck:     Thyroid: No thyroid mass or thyromegaly.     Vascular: No carotid bruit.     Trachea: Trachea normal.  Cardiovascular:     Rate and Rhythm: Normal rate and regular rhythm.     Pulses: Normal pulses.     Heart sounds: Normal heart sounds, S1 normal and S2 normal. No murmur heard. No friction rub. No gallop.   Pulmonary:     Effort: Pulmonary effort is normal. No tachypnea or respiratory distress.     Breath sounds: Normal breath sounds. No decreased breath sounds, wheezing, rhonchi or rales.  Abdominal:     General: Bowel sounds are normal.     Palpations: Abdomen is soft.     Tenderness: There is no abdominal tenderness.  Musculoskeletal:     Cervical back: Normal range of motion and neck supple.  Skin:    General: Skin is warm and dry.     Findings: No rash.  Neurological:     Mental Status: She is alert.  Psychiatric:        Mood and Affect: Mood is not anxious or depressed.        Speech: Speech normal.        Behavior: Behavior normal. Behavior is cooperative.        Thought Content: Thought content normal.        Judgment: Judgment normal.       Results for orders placed or performed during the hospital encounter of 06/12/20  Resp Panel by RT-PCR  (Flu A&B, Covid) Nasopharyngeal Swab   Specimen: Nasopharyngeal Swab; Nasopharyngeal(NP) swabs in vial transport medium  Result Value Ref Range   SARS Coronavirus 2 by RT PCR NEGATIVE NEGATIVE   Influenza A by PCR NEGATIVE NEGATIVE   Influenza B by PCR NEGATIVE NEGATIVE  Basic metabolic panel  Result Value Ref Range   Sodium 141 135 - 145 mmol/L   Potassium 4.4 3.5 - 5.1 mmol/L   Chloride 108 98 - 111 mmol/L   CO2 24 22 - 32 mmol/L   Glucose, Bld 124 (H) 70 - 99 mg/dL   BUN 19 8 - 23 mg/dL   Creatinine, Ser 3.03 (H) 0.44 - 1.00 mg/dL   Calcium 8.9 8.9 - 10.3 mg/dL   GFR, Estimated 16 (L) >60 mL/min   Anion gap 9 5 - 15  CBC  Result Value Ref Range   WBC 6.0 4.0 - 10.5 K/uL   RBC 3.61 (L) 3.87 - 5.11 MIL/uL   Hemoglobin 10.6 (L) 12.0 - 15.0 g/dL   HCT 32.4 (L) 36.0 - 46.0 %   MCV 89.8 80.0 - 100.0 fL   MCH 29.4 26.0 - 34.0 pg   MCHC 32.7 30.0 - 36.0 g/dL   RDW 14.8 11.5 - 15.5 %   Platelets 173 150 - 400 K/uL   nRBC 0.0 0.0 - 0.2 %  Brain natriuretic peptide  Result Value Ref Range   B Natriuretic Peptide 1,301.8 (H) 0.0 - 100.0 pg/mL  Magnesium  Result Value Ref Range   Magnesium 2.3 1.7 - 2.4 mg/dL  Basic metabolic panel  Result Value Ref Range   Sodium 140 135 - 145 mmol/L   Potassium 3.8 3.5 - 5.1 mmol/L   Chloride 103 98 - 111 mmol/L   CO2 26 22 - 32  mmol/L   Glucose, Bld 94 70 - 99 mg/dL   BUN 19 8 - 23 mg/dL   Creatinine, Ser 2.91 (H) 0.44 - 1.00 mg/dL   Calcium 8.7 (L) 8.9 - 10.3 mg/dL   GFR, Estimated 17 (L) >60 mL/min   Anion gap 11 5 - 15  Troponin I (High Sensitivity)  Result Value Ref Range   Troponin I (High Sensitivity) 136 (HH) <18 ng/L  Troponin I (High Sensitivity)  Result Value Ref Range   Troponin I (High Sensitivity) 179 (HH) <18 ng/L  Troponin I (High Sensitivity)  Result Value Ref Range   Troponin I (High Sensitivity) 57 (H) <18 ng/L  Troponin I (High Sensitivity)  Result Value Ref Range   Troponin I (High Sensitivity) 49 (H) <18 ng/L     This visit occurred during the SARS-CoV-2 public health emergency.  Safety protocols were in place, including screening questions prior to the visit, additional usage of staff PPE, and extensive cleaning of exam room while observing appropriate contact time as indicated for disinfecting solutions.   COVID 19 screen:  No recent travel or known exposure to COVID19 The patient denies respiratory symptoms of COVID 19 at this time. The importance of social distancing was discussed today.   Assessment and Plan Problem List Items Addressed This Visit    Acute on chronic HFrEF (heart failure with reduced ejection fraction) (HCC)    Euvolemic in office today. Followed by Cardiology.      Adjustment disorder    Stable, chronic.  Continue current medication.   Sertraline  50 mg daily.      Chronic diarrhea    She is seeing Dr. Silverio Decamp for chronic diarrhea... Cdiff  Few months ago now treated.          CKD (chronic kidney disease) stage 3, GFR 30-59 ml/min (HCC)    due to  Decreased blood flow when she has cardiac arrest.:  GFR 13.. Dr. Ronald Lobo      Relevant Orders   Comprehensive metabolic panel (Completed)   Essential hypertension    Stable, chronic.  Continue current medication.   Coreg 12.5 BID      GERD (gastroesophageal reflux disease)    Has small  hiatal hernia.. had barium swallow showed  Stricture. Allergic to protonix, prilosec.. using pepcid AC BID, Tums.       Relevant Medications   ondansetron (ZOFRAN ODT) 4 MG disintegrating tablet   HLD (hyperlipidemia)    Stable, chronic.  Continue current medication.  On crestor and zetia  Followed by  Dr. Terri Skains.           Nonischemic cardiomyopathy (HCC)    Using torsemide  MWF prn. Occ wakes up at night wheezing, but no DOE... work up in progress.        Shortness of breath - Primary    Dr. Shearon Stalls, pulmonologist.. planning pulmonary function test to eval SOB at night.  Felt most likely  Due to GI  issues      Vitamin B12 deficiency    Due for re-eval.      Relevant Orders   Vitamin B12 (Completed)    Other Visit Diagnoses    Vitamin D deficiency       Relevant Orders   VITAMIN D 25 Hydroxy (Vit-D Deficiency, Fractures) (Completed)         [name], MD

## 2020-07-27 NOTE — Patient Instructions (Signed)
Please stop at the lab to have labs drawn.  

## 2020-07-30 ENCOUNTER — Emergency Department (HOSPITAL_COMMUNITY): Payer: Medicare HMO

## 2020-07-30 ENCOUNTER — Inpatient Hospital Stay (HOSPITAL_COMMUNITY)
Admission: EM | Admit: 2020-07-30 | Discharge: 2020-08-03 | DRG: 291 | Disposition: A | Discharge status: Home / Self Care | Payer: Medicare HMO | Attending: Internal Medicine | Admitting: Internal Medicine

## 2020-07-30 ENCOUNTER — Encounter (HOSPITAL_COMMUNITY): Payer: Self-pay | Admitting: Emergency Medicine

## 2020-07-30 ENCOUNTER — Other Ambulatory Visit: Payer: Self-pay

## 2020-07-30 DIAGNOSIS — Z888 Allergy status to other drugs, medicaments and biological substances status: Secondary | ICD-10-CM | POA: Diagnosis not present

## 2020-07-30 DIAGNOSIS — Z87891 Personal history of nicotine dependence: Secondary | ICD-10-CM

## 2020-07-30 DIAGNOSIS — Z95810 Presence of automatic (implantable) cardiac defibrillator: Secondary | ICD-10-CM

## 2020-07-30 DIAGNOSIS — J9601 Acute respiratory failure with hypoxia: Secondary | ICD-10-CM | POA: Diagnosis present

## 2020-07-30 DIAGNOSIS — Z8674 Personal history of sudden cardiac arrest: Secondary | ICD-10-CM | POA: Diagnosis not present

## 2020-07-30 DIAGNOSIS — I509 Heart failure, unspecified: Secondary | ICD-10-CM | POA: Diagnosis not present

## 2020-07-30 DIAGNOSIS — Z87892 Personal history of anaphylaxis: Secondary | ICD-10-CM

## 2020-07-30 DIAGNOSIS — Z66 Do not resuscitate: Secondary | ICD-10-CM | POA: Diagnosis not present

## 2020-07-30 DIAGNOSIS — R062 Wheezing: Secondary | ICD-10-CM | POA: Diagnosis not present

## 2020-07-30 DIAGNOSIS — Z9851 Tubal ligation status: Secondary | ICD-10-CM

## 2020-07-30 DIAGNOSIS — K219 Gastro-esophageal reflux disease without esophagitis: Secondary | ICD-10-CM | POA: Diagnosis not present

## 2020-07-30 DIAGNOSIS — I130 Hypertensive heart and chronic kidney disease with heart failure and stage 1 through stage 4 chronic kidney disease, or unspecified chronic kidney disease: Principal | ICD-10-CM | POA: Diagnosis present

## 2020-07-30 DIAGNOSIS — E785 Hyperlipidemia, unspecified: Secondary | ICD-10-CM | POA: Diagnosis present

## 2020-07-30 DIAGNOSIS — I447 Left bundle-branch block, unspecified: Secondary | ICD-10-CM | POA: Diagnosis not present

## 2020-07-30 DIAGNOSIS — Z8673 Personal history of transient ischemic attack (TIA), and cerebral infarction without residual deficits: Secondary | ICD-10-CM

## 2020-07-30 DIAGNOSIS — R0600 Dyspnea, unspecified: Secondary | ICD-10-CM | POA: Diagnosis not present

## 2020-07-30 DIAGNOSIS — I959 Hypotension, unspecified: Secondary | ICD-10-CM | POA: Diagnosis not present

## 2020-07-30 DIAGNOSIS — Z91040 Latex allergy status: Secondary | ICD-10-CM

## 2020-07-30 DIAGNOSIS — I5043 Acute on chronic combined systolic (congestive) and diastolic (congestive) heart failure: Secondary | ICD-10-CM | POA: Diagnosis present

## 2020-07-30 DIAGNOSIS — R0902 Hypoxemia: Secondary | ICD-10-CM | POA: Diagnosis not present

## 2020-07-30 DIAGNOSIS — J9621 Acute and chronic respiratory failure with hypoxia: Secondary | ICD-10-CM | POA: Diagnosis present

## 2020-07-30 DIAGNOSIS — Z79899 Other long term (current) drug therapy: Secondary | ICD-10-CM

## 2020-07-30 DIAGNOSIS — Z882 Allergy status to sulfonamides status: Secondary | ICD-10-CM

## 2020-07-30 DIAGNOSIS — Z743 Need for continuous supervision: Secondary | ICD-10-CM | POA: Diagnosis not present

## 2020-07-30 DIAGNOSIS — Z881 Allergy status to other antibiotic agents status: Secondary | ICD-10-CM

## 2020-07-30 DIAGNOSIS — F32A Depression, unspecified: Secondary | ICD-10-CM | POA: Diagnosis present

## 2020-07-30 DIAGNOSIS — Z7982 Long term (current) use of aspirin: Secondary | ICD-10-CM | POA: Diagnosis not present

## 2020-07-30 DIAGNOSIS — R0602 Shortness of breath: Secondary | ICD-10-CM | POA: Diagnosis not present

## 2020-07-30 DIAGNOSIS — I10 Essential (primary) hypertension: Secondary | ICD-10-CM | POA: Diagnosis not present

## 2020-07-30 DIAGNOSIS — Z8249 Family history of ischemic heart disease and other diseases of the circulatory system: Secondary | ICD-10-CM

## 2020-07-30 DIAGNOSIS — I428 Other cardiomyopathies: Secondary | ICD-10-CM | POA: Diagnosis not present

## 2020-07-30 DIAGNOSIS — N184 Chronic kidney disease, stage 4 (severe): Secondary | ICD-10-CM | POA: Diagnosis present

## 2020-07-30 DIAGNOSIS — Z91013 Allergy to seafood: Secondary | ICD-10-CM

## 2020-07-30 DIAGNOSIS — I480 Paroxysmal atrial fibrillation: Secondary | ICD-10-CM | POA: Diagnosis present

## 2020-07-30 DIAGNOSIS — I110 Hypertensive heart disease with heart failure: Secondary | ICD-10-CM | POA: Diagnosis not present

## 2020-07-30 DIAGNOSIS — J90 Pleural effusion, not elsewhere classified: Secondary | ICD-10-CM | POA: Diagnosis not present

## 2020-07-30 DIAGNOSIS — Z7901 Long term (current) use of anticoagulants: Secondary | ICD-10-CM

## 2020-07-30 DIAGNOSIS — Z20822 Contact with and (suspected) exposure to covid-19: Secondary | ICD-10-CM | POA: Diagnosis present

## 2020-07-30 DIAGNOSIS — Z91010 Allergy to peanuts: Secondary | ICD-10-CM

## 2020-07-30 DIAGNOSIS — Z20828 Contact with and (suspected) exposure to other viral communicable diseases: Secondary | ICD-10-CM | POA: Diagnosis not present

## 2020-07-30 DIAGNOSIS — Z885 Allergy status to narcotic agent status: Secondary | ICD-10-CM

## 2020-07-30 DIAGNOSIS — I517 Cardiomegaly: Secondary | ICD-10-CM | POA: Diagnosis not present

## 2020-07-30 DIAGNOSIS — E782 Mixed hyperlipidemia: Secondary | ICD-10-CM | POA: Diagnosis not present

## 2020-07-30 LAB — CBC WITH DIFFERENTIAL/PLATELET
Abs Immature Granulocytes: 0.01 10*3/uL (ref 0.00–0.07)
Basophils Absolute: 0 10*3/uL (ref 0.0–0.1)
Basophils Relative: 1 %
Eosinophils Absolute: 0.4 10*3/uL (ref 0.0–0.5)
Eosinophils Relative: 7 %
HCT: 34.5 % — ABNORMAL LOW (ref 36.0–46.0)
Hemoglobin: 11.2 g/dL — ABNORMAL LOW (ref 12.0–15.0)
Immature Granulocytes: 0 %
Lymphocytes Relative: 19 %
Lymphs Abs: 1.1 10*3/uL (ref 0.7–4.0)
MCH: 29.8 pg (ref 26.0–34.0)
MCHC: 32.5 g/dL (ref 30.0–36.0)
MCV: 91.8 fL (ref 80.0–100.0)
Monocytes Absolute: 0.3 10*3/uL (ref 0.1–1.0)
Monocytes Relative: 5 %
Neutro Abs: 4.1 10*3/uL (ref 1.7–7.7)
Neutrophils Relative %: 68 %
Platelets: 196 10*3/uL (ref 150–400)
RBC: 3.76 MIL/uL — ABNORMAL LOW (ref 3.87–5.11)
RDW: 14.9 % (ref 11.5–15.5)
WBC: 6 10*3/uL (ref 4.0–10.5)
nRBC: 0 % (ref 0.0–0.2)

## 2020-07-30 LAB — BRAIN NATRIURETIC PEPTIDE: B Natriuretic Peptide: 1856 pg/mL — ABNORMAL HIGH (ref 0.0–100.0)

## 2020-07-30 LAB — COMPREHENSIVE METABOLIC PANEL
ALT: 11 U/L (ref 0–44)
AST: 25 U/L (ref 15–41)
Albumin: 4.2 g/dL (ref 3.5–5.0)
Alkaline Phosphatase: 64 U/L (ref 38–126)
Anion gap: 11 (ref 5–15)
BUN: 18 mg/dL (ref 8–23)
CO2: 26 mmol/L (ref 22–32)
Calcium: 9.1 mg/dL (ref 8.9–10.3)
Chloride: 100 mmol/L (ref 98–111)
Creatinine, Ser: 2.9 mg/dL — ABNORMAL HIGH (ref 0.44–1.00)
GFR, Estimated: 17 mL/min — ABNORMAL LOW (ref >60)
Glucose, Bld: 136 mg/dL — ABNORMAL HIGH (ref 70–99)
Potassium: 4.1 mmol/L (ref 3.5–5.1)
Sodium: 137 mmol/L (ref 135–145)
Total Bilirubin: 1.1 mg/dL (ref 0.3–1.2)
Total Protein: 7.2 g/dL (ref 6.5–8.1)

## 2020-07-30 LAB — SARS CORONAVIRUS 2 (TAT 6-24 HRS): SARS Coronavirus 2: NEGATIVE

## 2020-07-30 LAB — TROPONIN I (HIGH SENSITIVITY): Troponin I (High Sensitivity): 28 ng/L — ABNORMAL HIGH (ref <18)

## 2020-07-30 LAB — MAGNESIUM: Magnesium: 2.5 mg/dL — ABNORMAL HIGH (ref 1.7–2.4)

## 2020-07-30 LAB — LACTIC ACID, PLASMA: Lactic Acid, Venous: 0.8 mmol/L (ref 0.5–1.9)

## 2020-07-30 MED ORDER — SERTRALINE HCL 50 MG PO TABS
50.0000 mg | ORAL_TABLET | Freq: Every day | ORAL | Status: DC
  Administered 2020-07-31 – 2020-08-02 (×4): 50 mg via ORAL
  Filled 2020-07-30 (×6): qty 1

## 2020-07-30 MED ORDER — FAMOTIDINE 20 MG PO TABS
20.0000 mg | ORAL_TABLET | Freq: Two times a day (BID) | ORAL | Status: DC
  Administered 2020-07-31 – 2020-08-01 (×4): 20 mg via ORAL
  Filled 2020-07-30 (×4): qty 1

## 2020-07-30 MED ORDER — FUROSEMIDE 10 MG/ML IJ SOLN
40.0000 mg | INTRAMUSCULAR | Status: AC
  Administered 2020-07-30: 40 mg via INTRAVENOUS
  Filled 2020-07-30: qty 4

## 2020-07-30 MED ORDER — EZETIMIBE 10 MG PO TABS
10.0000 mg | ORAL_TABLET | Freq: Every day | ORAL | Status: DC
  Administered 2020-07-31 – 2020-08-03 (×4): 10 mg via ORAL
  Filled 2020-07-30 (×5): qty 1

## 2020-07-30 MED ORDER — MAGNESIUM OXIDE 400 (241.3 MG) MG PO TABS
400.0000 mg | ORAL_TABLET | Freq: Two times a day (BID) | ORAL | Status: DC
  Administered 2020-07-31 – 2020-08-03 (×8): 400 mg via ORAL
  Filled 2020-07-30 (×8): qty 1

## 2020-07-30 MED ORDER — ONDANSETRON 4 MG PO TBDP
4.0000 mg | ORAL_TABLET | Freq: Three times a day (TID) | ORAL | Status: DC | PRN
  Filled 2020-07-30: qty 1

## 2020-07-30 MED ORDER — ENOXAPARIN SODIUM 30 MG/0.3ML ~~LOC~~ SOLN: 30.0000 mg | SUBCUTANEOUS | Status: DC

## 2020-07-30 MED ORDER — FUROSEMIDE 10 MG/ML IJ SOLN
40.0000 mg | Freq: Two times a day (BID) | INTRAMUSCULAR | Status: DC
  Administered 2020-07-31 – 2020-08-03 (×7): 40 mg via INTRAVENOUS
  Filled 2020-07-30 (×7): qty 4

## 2020-07-30 MED ORDER — ONDANSETRON HCL 4 MG/2ML IJ SOLN: 4.0000 mg | Freq: Four times a day (QID) | INTRAMUSCULAR | Status: DC | PRN

## 2020-07-30 MED ORDER — ROSUVASTATIN CALCIUM 5 MG PO TABS
10.0000 mg | ORAL_TABLET | Freq: Every day | ORAL | Status: DC
  Administered 2020-07-31 – 2020-08-02 (×3): 10 mg via ORAL
  Filled 2020-07-30 (×3): qty 2

## 2020-07-30 MED ORDER — ALBUTEROL SULFATE HFA 108 (90 BASE) MCG/ACT IN AERS
2.0000 | INHALATION_SPRAY | Freq: Once | RESPIRATORY_TRACT | Status: AC
  Administered 2020-07-30: 2 via RESPIRATORY_TRACT
  Filled 2020-07-30: qty 6.7

## 2020-07-30 MED ORDER — ALBUTEROL SULFATE HFA 108 (90 BASE) MCG/ACT IN AERS
2.0000 | INHALATION_SPRAY | RESPIRATORY_TRACT | Status: DC | PRN
  Filled 2020-07-30: qty 6.7

## 2020-07-30 MED ORDER — SODIUM CHLORIDE 0.9% FLUSH: 3.0000 mL | INTRAVENOUS | Status: DC | PRN

## 2020-07-30 MED ORDER — NITROGLYCERIN 0.4 MG SL SUBL: 0.4000 mg | SUBLINGUAL_TABLET | SUBLINGUAL | Status: DC | PRN

## 2020-07-30 MED ORDER — SODIUM CHLORIDE 0.9% FLUSH
3.0000 mL | Freq: Two times a day (BID) | INTRAVENOUS | Status: DC
  Administered 2020-07-31 – 2020-08-03 (×8): 3 mL via INTRAVENOUS

## 2020-07-30 MED ORDER — TORSEMIDE 10 MG PO TABS: 5.0000 mg | ORAL_TABLET | ORAL | Status: DC

## 2020-07-30 MED ORDER — APIXABAN 5 MG PO TABS
5.0000 mg | ORAL_TABLET | Freq: Two times a day (BID) | ORAL | Status: DC
  Administered 2020-07-31 – 2020-08-03 (×8): 5 mg via ORAL
  Filled 2020-07-30 (×8): qty 1

## 2020-07-30 MED ORDER — VITAMIN D 25 MCG (1000 UNIT) PO TABS
1000.0000 [IU] | ORAL_TABLET | Freq: Every day | ORAL | Status: DC
  Administered 2020-07-31 – 2020-08-03 (×4): 1000 [IU] via ORAL
  Filled 2020-07-30 (×4): qty 1

## 2020-07-30 MED ORDER — ACETAMINOPHEN 325 MG PO TABS: 650.0000 mg | ORAL_TABLET | ORAL | Status: DC | PRN

## 2020-07-30 MED ORDER — CARVEDILOL 6.25 MG PO TABS
6.2500 mg | ORAL_TABLET | Freq: Two times a day (BID) | ORAL | Status: DC
  Administered 2020-07-31 – 2020-08-02 (×5): 6.25 mg via ORAL
  Filled 2020-07-30 (×7): qty 1

## 2020-07-30 MED ORDER — ENOXAPARIN SODIUM 30 MG/0.3ML ~~LOC~~ SOLN: 30.0000 mg | Freq: Every day | SUBCUTANEOUS | Status: DC

## 2020-07-30 MED ORDER — SODIUM CHLORIDE 0.9 % IV SOLN: 250.0000 mL | INTRAVENOUS | Status: DC | PRN

## 2020-07-30 MED ORDER — ASPIRIN 81 MG PO CHEW
81.0000 mg | CHEWABLE_TABLET | Freq: Every day | ORAL | Status: DC
  Administered 2020-07-31 – 2020-08-03 (×4): 81 mg via ORAL
  Filled 2020-07-30 (×4): qty 1

## 2020-07-30 NOTE — ED Triage Notes (Signed)
Pt brought to ED by [facility] from home with respiratory distress, SPO2 76 % on RA, up to 82% 2L [place] neb treatment and due neb given pta to ED with SPO2 100% on 2L [place] BP 154/76, HR 74. Pt AO x 4 on arrival SPO2 89 RA, pt placed on 2L .

## 2020-07-30 NOTE — ED Provider Notes (Signed)
[facility] EMERGENCY DEPARTMENT Provider Note   CSN: [id number] Arrival date & time: 07/30/20  1644     History Chief Complaint  Patient presents with  . Shortness of Breath    [name] is a 70 y.o. female.  HPI   This patient is a 70 year old female, she has a history of congestive heart failure and chronic systolic heart failure, she had an implantable cardiac defibrillator placed after having a cardiac arrest last year.  Since that time she has done very well but notes that she is intermittently short of breath, over the last few days she has been even more short of breath though she denies having increasing swelling.  She is more orthopneic than usual, more dyspneic on exertion than usual and was found to be hypoxic today  Review of the medical record shows that she has a nonischemic cardiomyopathy, she has a history of paroxysmal atrial fibrillation on Eliquis and a left bundle branch block, stage IV chronic kidney disease and had been admitted for possible CHF exacerbation approximately 6 weeks ago  Her last echocardiogram showed an ejection fraction of 25 to 30%, this was performed on April 14, 2020  The patient denies any fevers or chills or coughing, she states that she wheezes when she lays flat so she has been sleeping on 2-3 pillows.  Symptoms are persistent gradually worsening and more severe today  Past Medical History:  Diagnosis Date  . Allergy   . Anxiety   . Arnold-Chiari malformation ([place])   . Arthritis   . CHF (congestive heart failure) ([place])   . Chronic systolic heart failure ([place]) 11/03/2019  . Coronary artery disease   . Dyspnea   . Encounter for assessment of implantable cardioverter-defibrillator (ICD) 11/10/2019  . GERD (gastroesophageal reflux disease)   . H. pylori infection    3 years ago  . H/O cardiac arrest    Hx of VT/Vfib arrest  . Hyperlipidemia   . ICD BiV Medtronic model [id number] Claria Quad CRT-D SureScan ICD  08/25/2019   . Incontinence   . Paroxysmal atrial fibrillation ([place]) 08/06/2019  . Pulmonary edema 2021  . Syncope and collapse    Per pt, denies passing out  . Tobacco use disorder   . Unspecified essential hypertension     Patient Active Problem List   Diagnosis Date Noted  . Pleural effusion 06/13/2020  . Shortness of breath   . Acute on chronic combined systolic and diastolic congestive heart failure ([place]) 06/12/2020  . CKD (chronic kidney disease) stage 4, GFR 15-29 ml/min (HCC) 06/12/2020  . DNR (do not resuscitate) 06/12/2020  . Normocytic anemia 06/12/2020  . Encounter for assessment of implantable cardioverter-defibrillator (ICD) 11/10/2019  . H/O cardiac arrest 08/03/2019 11/10/2019  . Coronary artery disease involving native coronary artery of native heart without angina pectoris 11/03/2019  . Acute on chronic systolic CHF (congestive heart failure) ([place]) 11/03/2019  . ICD BiV Medtronic model [id number] Claria Quad CRT-D SureScan ICD 08/25/2019   . Cardiac arrest with ventricular fibrillation ([place]) 08/24/2019  . Long term current use of anticoagulant 08/14/2019  . Cardiomyopathy ([place]) 08/14/2019  . Paroxysmal atrial fibrillation ([place]) 08/06/2019  . Dysphagia   . History of CVA (cerebrovascular accident) 08/02/2019  . Left bundle branch block 08/02/2019  . CKD (chronic kidney disease) stage 3, GFR 30-59 ml/min ([place]) 12/13/2018  . Primary localized osteoarthritis of right knee 02/17/2017  . Vitamin B12 deficiency 09/10/2015  . Insomnia 07/16/2015  . GERD (gastroesophageal reflux  disease) 03/26/2015  . HLD (hyperlipidemia) 11/19/2011  . Adjustment disorder 01/08/2011  . Former smoker 12/19/2008  . Essential hypertension 12/19/2008    Past Surgical History:  Procedure Laterality Date  . APPENDECTOMY    . ARNOLD CHIARI SURGERY     neurocranial surgery  . BIV ICD INSERTION CRT-D N/A 08/25/2019   Procedure: BIV ICD INSERTION CRT-D;  Surgeon: Constance Haw, MD;   Location: [facility];  Service: Cardiovascular;  Laterality: N/A;  . BLEPHAROPLASTY     Bil  . C-EYE SURGERY PROCEDURE    . CARDIAC CATHETERIZATION    . CHOLECYSTECTOMY    . EYE SURGERY    . heart failure    . LEFT HEART CATH AND CORONARY ANGIOGRAPHY N/A 08/04/2019   Procedure: LEFT HEART CATH AND CORONARY ANGIOGRAPHY;  Surgeon: Adrian Prows, MD;  Location: [facility];  Service: Cardiovascular;  Laterality: N/A;  . MASS EXCISION Right 12/27/2013   Procedure: MINOR EXCISION OF RIGHT THUMB MUCOID CYST, DEBRIDEMENT OF INTERPHALANGEAL JOINT;  Surgeon: Cammie Sickle, MD;  Location: [facility];  Service: Orthopedics;  Laterality: Right;  . mass on thumb  right  . TOTAL KNEE ARTHROPLASTY Right 02/17/2017  . TOTAL KNEE ARTHROPLASTY Right 02/17/2017   Procedure: TOTAL KNEE ARTHROPLASTY;  Surgeon: Melrose Nakayama, MD;  Location: [facility];  Service: Orthopedics;  Laterality: Right;  . TUBAL LIGATION       OB History   No obstetric history on file.     Family History  Problem Relation Age of Onset  . Bone cancer Mother   . Heart disease Mother   . Cancer Sister        metastatic; unknown primary  . Diverticulosis Sister   . Tuberculosis Father   . Hypertension Sister   . Colon cancer Neg Hx   . Esophageal cancer Neg Hx   . Pancreatic cancer Neg Hx   . Stomach cancer Neg Hx   . Liver disease Neg Hx   . Kidney disease Neg Hx   . Rectal cancer Neg Hx   . Pancreatic disease Neg Hx     Social History   Tobacco Use  . Smoking status: Former Smoker    Packs/day: 1.00    Years: 50.00    Pack years: 50.00    Types: Cigarettes    Quit date: 08/08/2019    Years since quitting: 0.9  . Smokeless tobacco: Never Used  Vaping Use  . Vaping Use: Never used  Substance Use Topics  . Alcohol use: No    Alcohol/week: 0.0 standard drinks  . Drug use: No    Home Medications Prior to Admission medications   Medication Sig Start Date End Date Taking? Authorizing  Provider  acetaminophen (TYLENOL) 500 MG tablet Take 1,000 mg by mouth 2 (two) times daily as needed (pain).    [provider]  albuterol (VENTOLIN HFA) 108 (90 Base) MCG/ACT inhaler Inhale 2 puffs into the lungs every 2 (two) hours as needed for wheezing or shortness of breath. 06/13/20   Donne Hazel, MD  apixaban (ELIQUIS) 5 MG TABS tablet Take 1 tablet (5 mg total) by mouth 2 (two) times daily. 12/08/19   Tolia, Sunit, DO  aspirin 81 MG chewable tablet Chew 1 tablet (81 mg total) by mouth daily. 08/08/19   Alma Friendly, MD  carvedilol (COREG) 12.5 MG tablet Take 6.25 mg by mouth 2 (two) times daily with a meal.    [provider]  Cholecalciferol (VITAMIN D3 PO) Take 1,000 Units by mouth in the morning and at bedtime.    [provider]  ezetimibe (ZETIA) 10 MG tablet TAKE 1 TABLET BY MOUTH EVERY DAY 06/20/20   Tolia, Sunit, DO  Magnesium 400 MG TABS Take 400 mg by mouth 2 (two) times daily.  09/29/19   [provider]  nitroGLYCERIN (NITROSTAT) 0.4 MG SL tablet Place 1 tablet (0.4 mg total) under the tongue every 5 (five) minutes as needed for chest pain. 04/06/20 07/05/20  Cantwell, Celeste C, PA-C  ondansetron (ZOFRAN ODT) 4 MG disintegrating tablet Take 1 tablet (4 mg total) by mouth every 8 (eight) hours as needed for nausea or vomiting. 07/27/20   Bedsole, Amy E, MD  PEPCID 20 MG tablet Take 1 tablet (20 mg total) by mouth 2 (two) times daily. 06/22/20   Mauri Pole, MD  rosuvastatin (CRESTOR) 10 MG tablet TAKE 1 TABLET (10 MG TOTAL) BY MOUTH DAILY AT 6 PM. 06/20/20 07/20/20  Tolia, Sunit, DO  sertraline (ZOLOFT) 50 MG tablet Take 50 mg by mouth at bedtime.    [provider]  torsemide (DEMADEX) 5 MG tablet Take 1 tablet (5 mg total) by mouth every Monday, Wednesday, and Friday. 04/23/20   Tolia, Sunit, DO    Allergies    Shellfish-derived products, Ativan [lorazepam], Buspar [buspirone], Clarithromycin, Codeine, Doxycycline,  Guaifenesin, Oxycodone, Prednisone, Statins, Sulfonamide derivatives, Tetracycline, Vicodin [hydrocodone-acetaminophen], Famotidine, Latex, Omeprazole, Peanut-containing drug products, Protonix [pantoprazole], and Wellbutrin [bupropion]  Review of Systems   Review of Systems  All other systems reviewed and are negative.   Physical Exam Updated Vital Signs BP (!) 150/83   Pulse 70   Temp 98.2 F (36.8 C) (Oral)   Resp 18   Ht 1.626 m (5\' 4" )   Wt 63.5 kg   SpO2 93%   BMI 24.03 kg/m   Physical Exam Vitals and nursing note reviewed.  Constitutional:      General: She is not in acute distress.    Appearance: She is well-developed and well-nourished.  HENT:     Head: Normocephalic and atraumatic.     Mouth/Throat:     Mouth: Oropharynx is clear and moist.     Pharynx: No oropharyngeal exudate.  Eyes:     General: No scleral icterus.       Right eye: No discharge.        Left eye: No discharge.     Extraocular Movements: EOM normal.     Conjunctiva/sclera: Conjunctivae normal.     Pupils: Pupils are equal, round, and reactive to light.  Neck:     Thyroid: No thyromegaly.     Vascular: No JVD.  Cardiovascular:     Rate and Rhythm: Normal rate and regular rhythm.     Pulses: Intact distal pulses.     Heart sounds: Normal heart sounds. No murmur heard. No friction rub. No gallop.   Pulmonary:     Effort: Pulmonary effort is normal. No respiratory distress.     Breath sounds: Wheezing and rales present.     Comments: Subtle rales at the bases, mild expiratory wheezing, is able to speak in full sentences but mildly tachypneic Abdominal:     General: Bowel sounds are normal. There is no distension.     Palpations: Abdomen is soft. There is no mass.     Tenderness: There is no abdominal tenderness.  Musculoskeletal:        General: No tenderness or edema. Normal range of motion.  Cervical back: Normal range of motion and neck supple.     Right lower leg: No edema.      Left lower leg: No edema.  Lymphadenopathy:     Cervical: No cervical adenopathy.  Skin:    General: Skin is warm and dry.     Findings: No erythema or rash.  Neurological:     Mental Status: She is alert.     Coordination: Coordination normal.  Psychiatric:        Mood and Affect: Mood and affect normal.        Behavior: Behavior normal.     ED Results / Procedures / Treatments   Labs (all labs ordered are listed, but only abnormal results are displayed) Labs Reviewed  BRAIN NATRIURETIC PEPTIDE - Abnormal; Notable for the following components:      Result Value   B Natriuretic Peptide 1,856.0 (*)    All other components within normal limits  CBC WITH DIFFERENTIAL/PLATELET - Abnormal; Notable for the following components:   RBC 3.76 (*)    Hemoglobin 11.2 (*)    HCT 34.5 (*)    All other components within normal limits  COMPREHENSIVE METABOLIC PANEL - Abnormal; Notable for the following components:   Glucose, Bld 136 (*)    Creatinine, Ser 2.90 (*)    GFR, Estimated 17 (*)    All other components within normal limits  MAGNESIUM - Abnormal; Notable for the following components:   Magnesium 2.5 (*)    All other components within normal limits  TROPONIN I (HIGH SENSITIVITY) - Abnormal; Notable for the following components:   Troponin I (High Sensitivity) 28 (*)    All other components within normal limits  SARS CORONAVIRUS 2 (TAT 6-24 HRS)  LACTIC ACID, PLASMA    EKG EKG Interpretation  Date/Time:  Monday July 30 2020 16:56:45 EST Ventricular Rate:  70 PR Interval:    QRS Duration: 139 QT Interval:  486 QTC Calculation: 525 R Axis:   -94 Text Interpretation: Sinus rhythm Nonspecific IVCD with LAD Lateral infarct, old since last tracing no significant change Confirmed by Noemi Chapel [phone]) on 07/30/2020 5:10:50 PM   Radiology DG Chest Port 1 View  Result Date: 07/30/2020 CLINICAL DATA:  Shortness of breath for 4 days. EXAM: PORTABLE CHEST 1 VIEW  COMPARISON:  06/12/2020 FINDINGS: Pacer/AICD device. Midline trachea. Borderline cardiomegaly. Small right pleural effusion, increased. Trace left pleural fluid, new. No pneumothorax. Pulmonary interstitial thickening is increased. Right greater than left base airspace disease. Nodular density projecting over the right upper lobe laterally has been present back to 02/10/2017 and is likely due to a sclerotic lesion within the right scapula. Example of 1.8 cm. IMPRESSION: Cardiomegaly with interstitial thickening, suspicious for congestive heart failure. Increased right and new small left pleural effusions. Bibasilar airspace disease, most likely atelectasis. Especially at the right lung base, pneumonia cannot be excluded. Electronically Signed   By: Abigail Miyamoto M.D.   On: 07/30/2020 17:34    Procedures .Critical Care Performed by: Noemi Chapel, MD Authorized by: Noemi Chapel, MD   Critical care provider statement:    Critical care time (minutes):  35   Critical care time was exclusive of:  Separately billable procedures and treating other patients and teaching time   Critical care was necessary to treat or prevent imminent or life-threatening deterioration of the following conditions:  Respiratory failure and cardiac failure   Critical care was time spent personally by me on the following activities:  Blood draw for specimens,  development of treatment plan with patient or surrogate, discussions with consultants, evaluation of patient's response to treatment, examination of patient, obtaining history from patient or surrogate, ordering and performing treatments and interventions, ordering and review of laboratory studies, ordering and review of radiographic studies, pulse oximetry, re-evaluation of patient's condition and review of old charts Comments:           Medications Ordered in ED Medications  albuterol (VENTOLIN HFA) 108 (90 Base) MCG/ACT inhaler 2 puff (has no administration in time  range)  furosemide (LASIX) injection 40 mg (40 mg Intravenous Given 07/30/20 1914)    ED Course  I have reviewed the triage vital signs and the nursing notes.  Pertinent labs & imaging results that were available during my care of the patient were reviewed by me and considered in my medical decision making (see chart for details).    MDM Rules/Calculators/A&P                          At this time the patient does appear hypoxic to 87%, she has history of congestive heart failure, she is appropriately anticoagulated and pulmonary embolism seems less likely.  Would consider congestive heart failure, COVID-19, labs are pending, patient agreeable  I have reviewed the case with the patient, reviewed her lab work which shows a significantly elevated BNP of 1800, lactate of 0.8 normal white blood cell count, no anemia and a metabolic panel which is reassuring except for creatinine of 2.9 which is at the patient's baseline. Troponin is 28.  BNP is significantly elevated  Discussed with Dr. Jonelle Sidle of the hospital service will admit.  Final Clinical Impression(s) / ED Diagnoses Final diagnoses:  Acute on chronic congestive heart failure, unspecified heart failure type ([place])  Acute respiratory failure with hypoxia ([facility])      Noemi Chapel, MD 07/30/20 2235

## 2020-07-30 NOTE — H&P (Signed)
History and Physical   [name] [id number]:[id number] DOB: 25-May-1951 DOA: 07/30/2020  Referring MD/NP/PA: Noemi Chapel, MD  PCP: Jinny Sanders, MD   Outpatient Specialists: Rex Kras, cardiology  Patient coming from: Home  Chief Complaint: Shortness of breath  HPI: [name] is a 70 y.o. 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  Symptoms have been going on over the last 1 week.  Associated with PND and orthopnea.  This has gotten progressively worse over the last few days.  Denied any fever or chills.  No COVID-19 exposure, no pedal edema.  No chills.  Patient was recently diagnosed with systolic dysfunction CHF after EF of 25 to 30% last October.  She has never had pedal swelling with her symptoms.  She is told us not have one today.  BNP seems to be close to baseline.  Patient being admitted with acute exacerbation of her systolic dysfunction CHF.[name]  ED Course: Temperature 98.2 blood pressure 150/83 pulse 71 respirate of 18 oxygen sats 90% on room air.  CBC showed hemoglobin 11.2 chemistry showed creatinine 2.90, BNP of 1856.  Troponin XX 8.  Glucose is 136 gap of 11 magnesium 2.5.  Lactic acid 0.8.  COVID-19 screen is negative.  Chest x-ray shows cardiomegaly with interstitial thickening suspicious for heart failure with some increased right and new small left pleural effusions.  EKG is unchanged from previous.  Patient being admitted for further evaluation and treatment of systolic dysfunction CHF.  Review of Systems: As per HPI otherwise 10 point review of systems negative.    Past Medical History:  Diagnosis Date  . Allergy   . Anxiety   . Arnold-Chiari malformation ([place])   .  Arthritis   . CHF (congestive heart failure) ([place])   . Chronic systolic heart failure ([place]) 11/03/2019  . Coronary artery disease   . Dyspnea   . Encounter for assessment of implantable cardioverter-defibrillator (ICD) 11/10/2019  . GERD (gastroesophageal reflux disease)   . H. pylori infection    3 years ago  . H/O cardiac arrest    Hx of VT/Vfib arrest  . Hyperlipidemia   . ICD BiV Medtronic model [id number] [place] Quad CRT-D SureScan ICD 08/25/2019   . Incontinence   . Paroxysmal atrial fibrillation ([place]) 08/06/2019  . Pulmonary edema 2021  . Syncope and collapse    Per pt, denies passing out  . Tobacco use disorder   . Unspecified essential hypertension     Past Surgical History:  Procedure Laterality Date  . APPENDECTOMY    . ARNOLD CHIARI SURGERY     neurocranial surgery  . BIV ICD INSERTION CRT-D N/A 08/25/2019   Procedure: BIV ICD INSERTION CRT-D;  Surgeon: Constance Haw, MD;  Location: [facility];  Service: Cardiovascular;  Laterality: N/A;  . BLEPHAROPLASTY     Bil  . C-EYE SURGERY PROCEDURE    . CARDIAC CATHETERIZATION    . CHOLECYSTECTOMY    . EYE SURGERY    . heart failure    . LEFT HEART CATH AND CORONARY ANGIOGRAPHY N/A 08/04/2019   Procedure: LEFT HEART CATH AND CORONARY ANGIOGRAPHY;  Surgeon: Adrian Prows, MD;  Location: [facility];  Service: Cardiovascular;  Laterality: N/A;  . MASS EXCISION Right 12/27/2013  Procedure: MINOR EXCISION OF RIGHT THUMB MUCOID CYST, DEBRIDEMENT OF INTERPHALANGEAL JOINT;  Surgeon: Cammie Sickle, MD;  Location: [facility];  Service: Orthopedics;  Laterality: Right;  . mass on thumb  right  . TOTAL KNEE ARTHROPLASTY Right 02/17/2017  . TOTAL KNEE ARTHROPLASTY Right 02/17/2017   Procedure: TOTAL KNEE ARTHROPLASTY;  Surgeon: Melrose Nakayama, MD;  Location: [facility];  Service: Orthopedics;  Laterality: Right;  . TUBAL LIGATION       reports that she quit smoking about a year ago. Her smoking use  included cigarettes. She has a 50.00 pack-year smoking history. She has never used smokeless tobacco. She reports that she does not drink alcohol and does not use drugs.  Allergies  Allergen Reactions  . Shellfish-Derived Products Anaphylaxis  . Ativan [Lorazepam] Other (See Comments)    Makes "skin crawl" and insomnia  . Buspar [Buspirone] Nausea Only    Sweating and dizzy  . Clarithromycin Swelling  . Codeine Nausea And Vomiting  . Doxycycline Other (See Comments)    Unknown  . Guaifenesin Other (See Comments)    Palpitations  . Oxycodone Nausea And Vomiting  . Prednisone Nausea And Vomiting and Other (See Comments)    Makes my heart race  . Statins Other (See Comments)    Muscle pain  . Sulfonamide Derivatives Nausea And Vomiting    Achiness  . Tetracycline Nausea Only  . Vicodin [Hydrocodone-Acetaminophen] Nausea And Vomiting  . Famotidine Rash  . Latex Rash  . Omeprazole Nausea Only    Cough, shortness of breath - patient doesn't remember  . Peanut-Containing Drug Products Itching and Rash  . Protonix [Pantoprazole] Rash  . Wellbutrin [Bupropion] Palpitations    Family History  Problem Relation Age of Onset  . Bone cancer Mother   . Heart disease Mother   . Cancer Sister        metastatic; unknown primary  . Diverticulosis Sister   . Tuberculosis Father   . Hypertension Sister   . Colon cancer Neg Hx   . Esophageal cancer Neg Hx   . Pancreatic cancer Neg Hx   . Stomach cancer Neg Hx   . Liver disease Neg Hx   . Kidney disease Neg Hx   . Rectal cancer Neg Hx   . Pancreatic disease Neg Hx      Prior to Admission medications   Medication Sig Start Date End Date Taking? Authorizing Provider  acetaminophen (TYLENOL) 500 MG tablet Take 1,000 mg by mouth 2 (two) times daily as needed (pain).    [provider]  albuterol (VENTOLIN HFA) 108 (90 Base) MCG/ACT inhaler Inhale 2 puffs into the lungs every 2 (two) hours as needed for wheezing or shortness of  breath. 06/13/20   Donne Hazel, MD  apixaban (ELIQUIS) 5 MG TABS tablet Take 1 tablet (5 mg total) by mouth 2 (two) times daily. 12/08/19   Tolia, Sunit, DO  aspirin 81 MG chewable tablet Chew 1 tablet (81 mg total) by mouth daily. 08/08/19   Alma Friendly, MD  carvedilol (COREG) 12.5 MG tablet Take 6.25 mg by mouth 2 (two) times daily with a meal.    [provider]  Cholecalciferol (VITAMIN D3 PO) Take 1,000 Units by mouth in the morning and at bedtime.    [provider]  ezetimibe (ZETIA) 10 MG tablet TAKE 1 TABLET BY MOUTH EVERY DAY 06/20/20   Tolia, Sunit, DO  Magnesium 400 MG TABS Take 400 mg by mouth 2 (two)  times daily.  09/29/19   [provider]  nitroGLYCERIN (NITROSTAT) 0.4 MG SL tablet Place 1 tablet (0.4 mg total) under the tongue every 5 (five) minutes as needed for chest pain. 04/06/20 07/05/20  Cantwell, Celeste C, PA-C  ondansetron (ZOFRAN ODT) 4 MG disintegrating tablet Take 1 tablet (4 mg total) by mouth every 8 (eight) hours as needed for nausea or vomiting. 07/27/20   Bedsole, Amy E, MD  PEPCID 20 MG tablet Take 1 tablet (20 mg total) by mouth 2 (two) times daily. 06/22/20   Mauri Pole, MD  rosuvastatin (CRESTOR) 10 MG tablet TAKE 1 TABLET (10 MG TOTAL) BY MOUTH DAILY AT 6 PM. 06/20/20 07/20/20  Tolia, Sunit, DO  sertraline (ZOLOFT) 50 MG tablet Take 50 mg by mouth at bedtime.    [provider]  torsemide (DEMADEX) 5 MG tablet Take 1 tablet (5 mg total) by mouth every Monday, Wednesday, and Friday. 04/23/20   Rex Kras, DO    Physical Exam: Vitals:   07/30/20 2145 07/30/20 2215 07/30/20 2230 07/30/20 2300  BP: 133/69 (!) 145/80    Pulse: 70 70 70 70  Resp: 17  18 13   Temp:      TempSrc:      SpO2: 92% 95% 94% 94%  Weight:      Height:          Constitutional: Chronically ill looking no distress Vitals:   07/30/20 2145 07/30/20 2215 07/30/20 2230 07/30/20 2300  BP: 133/69 (!) 145/80    Pulse: 70 70 70 70  Resp: 17   18 13   Temp:      TempSrc:      SpO2: 92% 95% 94% 94%  Weight:      Height:       Eyes: PERRL, lids and conjunctivae normal ENMT: Mucous membranes are moist. Posterior pharynx clear of any exudate or lesions.Normal dentition.  Neck: normal, supple, no masses, no thyromegaly Respiratory: Decreased air entry bilaterally with basal crackles normal respiratory effort. No accessory muscle use.  Cardiovascular: Irregularly irregular rate and rhythm no murmurs / rubs / gallops. No extremity edema. 2+ pedal pulses. No carotid bruits.  Abdomen: no tenderness, no masses palpated. No hepatosplenomegaly. Bowel sounds positive.  Musculoskeletal: no clubbing / cyanosis. No joint deformity upper and lower extremities. Good ROM, no contractures. Normal muscle tone.  Skin: no rashes, lesions, ulcers. No induration Neurologic: CN 2-12 grossly intact. Sensation intact, DTR normal. Strength 5/5 in all 4.  Psychiatric: Normal judgment and insight. Alert and oriented x 3.  Depressed mood.     Labs on Admission: I have personally reviewed following labs and imaging studies  CBC: Recent Labs  Lab 07/30/20 1704  WBC 6.0  NEUTROABS 4.1  HGB 11.2*  HCT 34.5*  MCV 91.8  PLT 366   Basic Metabolic Panel: Recent Labs  Lab 07/27/20 1301 07/30/20 1704  NA 140 137  K 4.1 4.1  CL 103 100  CO2 32 26  GLUCOSE 105* 136*  BUN 16 18  CREATININE 2.59* 2.90*  CALCIUM 9.2 9.1  MG  --  2.5*   GFR: Estimated Creatinine Clearance: 15.6 mL/min (A) (by C-G formula based on SCr of 2.9 mg/dL (H)). Liver Function Tests: Recent Labs  Lab 07/27/20 1301 07/30/20 1704  AST 14 25  ALT 5 11  ALKPHOS 48 64  BILITOT 0.4 1.1  PROT 6.9 7.2  ALBUMIN 4.1 4.2   No results for input(s): LIPASE, AMYLASE in the last 168 hours. No results  for input(s): AMMONIA in the last 168 hours. Coagulation Profile: No results for input(s): INR, PROTIME in the last 168 hours. Cardiac Enzymes: No results for input(s): CKTOTAL,  CKMB, CKMBINDEX, TROPONINI in the last 168 hours. BNP (last 3 results) Recent Labs    10/20/19 1208 11/10/19 1507 11/17/19 1031  PROBNP 3,188* 18,452* 13,138*   HbA1C: No results for input(s): HGBA1C in the last 72 hours. CBG: No results for input(s): GLUCAP in the last 168 hours. Lipid Profile: No results for input(s): CHOL, HDL, LDLCALC, TRIG, CHOLHDL, LDLDIRECT in the last 72 hours. Thyroid Function Tests: No results for input(s): TSH, T4TOTAL, FREET4, T3FREE, THYROIDAB in the last 72 hours. Anemia Panel: No results for input(s): VITAMINB12, FOLATE, FERRITIN, TIBC, IRON, RETICCTPCT in the last 72 hours. Urine analysis:    Component Value Date/Time   COLORURINE YELLOW 04/23/2020 1252   APPEARANCEUR CLEAR 04/23/2020 1252   LABSPEC 1.015 04/23/2020 1252   PHURINE 6.0 04/23/2020 1252   GLUCOSEU NEGATIVE 04/23/2020 1252   HGBUR TRACE-INTACT (A) 04/23/2020 1252   HGBUR negative 03/01/2007 1207   BILIRUBINUR NEGATIVE 04/23/2020 1252   BILIRUBINUR negative 03/26/2020 1543   KETONESUR NEGATIVE 04/23/2020 1252   PROTEINUR Negative 03/26/2020 1543   PROTEINUR NEGATIVE 08/14/2019 2345   UROBILINOGEN 0.2 04/23/2020 1252   NITRITE NEGATIVE 04/23/2020 1252   LEUKOCYTESUR NEGATIVE 04/23/2020 1252   Sepsis Labs: @LABRCNTIP (procalcitonin:4,lacticidven:4) ) Recent Results (from the past 240 hour(s))  SARS CORONAVIRUS 2 (TAT 6-24 HRS) Nasopharyngeal Nasopharyngeal Swab     Status: None   Collection Time: 07/30/20  5:03 PM   Specimen: Nasopharyngeal Swab  Result Value Ref Range Status   SARS Coronavirus 2 NEGATIVE NEGATIVE Final    Comment: (NOTE) SARS-CoV-2 target nucleic acids are NOT DETECTED.  The SARS-CoV-2 RNA is generally detectable in upper and lower respiratory specimens during the acute phase of infection. Negative results do not preclude SARS-CoV-2 infection, do not rule out co-infections with other pathogens, and should not be used as the sole basis for treatment or  other patient management decisions. Negative results must be combined with clinical observations, patient history, and epidemiological information. The expected result is Negative.  Fact Sheet for Patients: SugarRoll.be  Fact Sheet for Healthcare Providers: https://www.woods-mathews.com/  This test is not yet approved or cleared by the [place] FDA and  has been authorized for detection and/or diagnosis of SARS-CoV-2 by FDA under an Emergency Use Authorization (EUA). This EUA will remain  in effect (meaning this test can be used) for the duration of the COVID-19 declaration under Se ction 564(b)(1) of the Act, 21 U.S.C. section 360bbb-3(b)(1), unless the authorization is terminated or revoked sooner.  Performed at [facility], [place] [address] [place], [place] [address]      Radiological Exams on Admission: DG Chest Port 1 View  Result Date: 07/30/2020 CLINICAL DATA:  Shortness of breath for 4 days. EXAM: PORTABLE CHEST 1 VIEW COMPARISON:  06/12/2020 FINDINGS: Pacer/AICD device. Midline trachea. Borderline cardiomegaly. Small right pleural effusion, increased. Trace left pleural fluid, new. No pneumothorax. Pulmonary interstitial thickening is increased. Right greater than left base airspace disease. Nodular density projecting over the right upper lobe laterally has been present back to 02/10/2017 and is likely due to a sclerotic lesion within the right scapula. Example of 1.8 cm. IMPRESSION: Cardiomegaly with interstitial thickening, suspicious for congestive heart failure. Increased right and new small left pleural effusions. Bibasilar airspace disease, most likely atelectasis. Especially at the right lung base, pneumonia cannot be excluded. Electronically Signed  By: Abigail Miyamoto M.D.   On: 07/30/2020 17:34    EKG: Independently reviewed.  It shows sinus rhythm with possible LAD and paced rhythm.  No change from  previous  Assessment/Plan Principal Problem:   Acute respiratory failure with hypoxemia (HCC) Active Problems:   Essential hypertension   HLD (hyperlipidemia)   GERD (gastroesophageal reflux disease)   Acute on chronic combined systolic and diastolic congestive heart failure (HCC)   CKD (chronic kidney disease) stage 4, GFR 15-29 ml/min (HCC)     #1 acute on chronic respiratory failure with hypoxia: Secondary to acute exacerbation of CHF.  Patient currently on oxygen.  Continue to titrate down as we aggressively diurese.  #2 acute exacerbation of systolic dysfunction CHF: EF of 20 to 25%.  IV Lasix.  Continue home regiment of cardio medicines.  #3 paroxysmal atrial fibrillation: Rate is controlled at the moment.  Continue home regimen  #4 essential hypertension: Confirm and resume home regimen.  #5 chronic kidney disease stage IV: Continue to monitor renal function.  #6 hyperlipidemia: Continue statin.   DVT prophylaxis: Eliquis Code Status: Full Family Communication: Son Disposition Plan: Home Consults called: Cardiology in the morning Admission status: Inpatient  Severity of Illness: The appropriate patient status for this patient is INPATIENT. Inpatient status is judged to be reasonable and necessary in order to provide the required intensity of service to ensure the patient's safety. The patient's presenting symptoms, physical exam findings, and initial radiographic and laboratory data in the context of their chronic comorbidities is felt to place them at high risk for further clinical deterioration. Furthermore, it is not anticipated that the patient will be medically stable for discharge from the hospital within 2 midnights of admission. The following factors support the patient status of inpatient.   " The patient's presenting symptoms include shortness. " The worrisome physical exam findings include bilateral crackles. " The initial radiographic and laboratory data are  worrisome because of pulmonary edema with pleural effusion. " The chronic co-morbidities include systolic dysfunction CHF.   * I certify that at the point of admission it is my clinical judgment that the patient will require inpatient hospital care spanning beyond 2 midnights from the point of admission due to high intensity of service, high risk for further deterioration and high frequency of surveillance required.Barbette Merino MD [facility] Pager [phone]  If 7PM-7AM, please contact night-coverage [email or web address] Password [facility]  07/30/2020, 11:51 PM

## 2020-07-31 DIAGNOSIS — J9601 Acute respiratory failure with hypoxia: Secondary | ICD-10-CM | POA: Diagnosis not present

## 2020-07-31 DIAGNOSIS — E782 Mixed hyperlipidemia: Secondary | ICD-10-CM

## 2020-07-31 DIAGNOSIS — K219 Gastro-esophageal reflux disease without esophagitis: Secondary | ICD-10-CM

## 2020-07-31 DIAGNOSIS — I10 Essential (primary) hypertension: Secondary | ICD-10-CM

## 2020-07-31 DIAGNOSIS — N184 Chronic kidney disease, stage 4 (severe): Secondary | ICD-10-CM

## 2020-07-31 DIAGNOSIS — I5043 Acute on chronic combined systolic (congestive) and diastolic (congestive) heart failure: Secondary | ICD-10-CM | POA: Diagnosis not present

## 2020-07-31 LAB — BASIC METABOLIC PANEL
Anion gap: 10 (ref 5–15)
BUN: 18 mg/dL (ref 8–23)
CO2: 28 mmol/L (ref 22–32)
Calcium: 8.9 mg/dL (ref 8.9–10.3)
Chloride: 102 mmol/L (ref 98–111)
Creatinine, Ser: 2.89 mg/dL — ABNORMAL HIGH (ref 0.44–1.00)
GFR, Estimated: 17 mL/min — ABNORMAL LOW (ref >60)
Glucose, Bld: 88 mg/dL (ref 70–99)
Potassium: 3.9 mmol/L (ref 3.5–5.1)
Sodium: 140 mmol/L (ref 135–145)

## 2020-07-31 NOTE — Progress Notes (Signed)
PROGRESS NOTE    [name]  OZY:[id number] DOB: [birth year] DOA: 07/30/2020 PCP: Jinny Sanders, MD    Brief Narrative:  [name] was admitted to the hospital with the working diagnosis acute hypoxemic respiratory failure due to acute systolic heart failure decompensation.  70 year old female with past medical history for paroxysmal atrial relation, systolic heart failure status post AICD, nonischemic cardiomyopathy, and stage IV chronic kidney disease who presented with dyspnea.  Progressive and worsening dyspnea for the last 7 days, associated with PND and orthopnea.  No lower extremity edema.  On her initial physical examination blood pressure 150/83, temperature 98.2, heart rate 71, respiratory rate 18, oxygen saturation 90% on room air, lungs have decreased air entry bilaterally with bilateral rales, heart S1-S2, irregularly irregular, abdomen soft, no lower extremity edema.  Sodium 137, potassium 4.1, chloride 100, bicarb 26, glucose 136, BUN 18, creatinine 2.90, BNP 1856, troponin I 28, white count 6.0, hemoglobin 0.2, hematocrit 34.5, platelets 196. SARS COVID-19 negative.  Chest radiograph with cardiomegaly, bilateral interstitial infiltrates predominantly at bases with small bilateral pleural effusions.  EKG 92 bpm, left axis deviation, atrial pacing, ventricular pacing, prolonged QRS, no ST segment or T wave changes.   Assessment & Plan:   Principal Problem:   Acute respiratory failure with hypoxemia (HCC) Active Problems:   Essential hypertension   HLD (hyperlipidemia)   GERD (gastroesophageal reflux disease)   Acute on chronic combined systolic and diastolic congestive heart failure (HCC)   CKD (chronic kidney disease) stage 4, GFR 15-29 ml/min (HCC)   1. Acute decompensated systolic heart failure, complicated with acute hypoxemic respiratory failure.  Continue to have dyspnea and hypervolemia.  Urine output not documented, systolic blood pressure 048 mmHg.    Continue diuresis with furosemide 40 mg Iv q12 hr to target further negative fluid balance.  Heart failure management with carvedilol.  Echocardiogram from 10/21, EF 25 to 35%, with severe global hypokinesis.   Follow on repeat echocardiogram.   2. CKD stage 4. Stable renal function with serum cr at 2.89 with K at 3,9 and bicarbonate at 28. Continue diuresis with furosemide for diuresis.  Follow up on renal function in am.   3. Paroxysmal atrial fibrillation. Continue rate control with carvedilol. Anticoagulation with apixaban.   4. HTN. Continue blood pressure control with carvedilol.   5. Dyslipidemia continue with ezetimbide and rosuvastatin  6. Swallow dysfunction. Patient had a esophagogram as outpatient, 01/22 with significant stasis in the mid esophagus and proximal esophagus. No gress stricture or lesion.   7. Depression. Continue sertraline.    Patient continue to be at high risk for worsening heart failure   Status is: Inpatient  Remains inpatient appropriate because:IV treatments appropriate due to intensity of illness or inability to take PO   Dispo: The patient is from: Home              Anticipated d/c is to: Home              Anticipated d/c date is: 3 days              Patient currently is not medically stable to d/c.   Difficult to place patient No    DVT prophylaxis: apixaban   Code Status:    DNR  Family Communication:  I spoke with patient's husband at the bedside, we talked in detail about patient's condition, plan of care and prognosis and all questions were addressed.     Subjective: Patient feeling better, but continue  to have dyspnea, not back to her baseline. No chest pain or lower extremity edema, positive dysphagia.   Objective: Vitals:   07/31/20 1324 07/31/20 1400 07/31/20 1445 07/31/20 1532  BP: 129/69 133/61 (!) 148/70 118/61  Pulse: 70 70 70 72  Resp: 15 16 18 16   Temp:   98.1 F (36.7 C) 98.4 F (36.9 C)  TempSrc:   Oral Oral   SpO2: 92% 92% 93% 92%  Weight:    60.2 kg  Height:    5\' 4"  (1.626 m)   No intake or output data in the 24 hours ending 07/31/20 1539 Filed Weights   07/30/20 1704 07/31/20 1532  Weight: 63.5 kg 60.2 kg    Examination:   General: Not in pain or dyspnea, deconditioned  Neurology: Awake and alert, non focal  E ENT: no pallor, no icterus, oral mucosa moist Cardiovascular: mild JVD. S1-S2 present, rhythmic, no gallops, rubs, or murmurs. No lower extremity edema. Pulmonary: positive breath sounds bilaterally, with wheezing, or rhonchi, scattered rales bilaterally. Gastrointestinal. Abdomen soft and non tender Skin. No rashes Musculoskeletal: no joint deformities     Data Reviewed: I have personally reviewed following labs and imaging studies  CBC: Recent Labs  Lab 07/30/20 1704  WBC 6.0  NEUTROABS 4.1  HGB 11.2*  HCT 34.5*  MCV 91.8  PLT 563   Basic Metabolic Panel: Recent Labs  Lab 07/27/20 1301 07/30/20 1704 07/31/20 0500  NA 140 137 140  K 4.1 4.1 3.9  CL 103 100 102  CO2 32 26 28  GLUCOSE 105* 136* 88  BUN 16 18 18   CREATININE 2.59* 2.90* 2.89*  CALCIUM 9.2 9.1 8.9  MG  --  2.5*  --    GFR: Estimated Creatinine Clearance: 15.6 mL/min (A) (by C-G formula based on SCr of 2.89 mg/dL (H)). Liver Function Tests: Recent Labs  Lab 07/27/20 1301 07/30/20 1704  AST 14 25  ALT 5 11  ALKPHOS 48 64  BILITOT 0.4 1.1  PROT 6.9 7.2  ALBUMIN 4.1 4.2   No results for input(s): LIPASE, AMYLASE in the last 168 hours. No results for input(s): AMMONIA in the last 168 hours. Coagulation Profile: No results for input(s): INR, PROTIME in the last 168 hours. Cardiac Enzymes: No results for input(s): CKTOTAL, CKMB, CKMBINDEX, TROPONINI in the last 168 hours. BNP (last 3 results) Recent Labs    10/20/19 1208 11/10/19 1507 11/17/19 1031  PROBNP 3,188* 18,452* 13,138*   HbA1C: No results for input(s): HGBA1C in the last 72 hours. CBG: No results for input(s):  GLUCAP in the last 168 hours. Lipid Profile: No results for input(s): CHOL, HDL, LDLCALC, TRIG, CHOLHDL, LDLDIRECT in the last 72 hours. Thyroid Function Tests: No results for input(s): TSH, T4TOTAL, FREET4, T3FREE, THYROIDAB in the last 72 hours. Anemia Panel: No results for input(s): VITAMINB12, FOLATE, FERRITIN, TIBC, IRON, RETICCTPCT in the last 72 hours.    Radiology Studies: I have reviewed all of the imaging during this hospital visit personally     Scheduled Meds: . apixaban  5 mg Oral BID  . aspirin  81 mg Oral Daily  . carvedilol  6.25 mg Oral BID WC  . cholecalciferol  1,000 Units Oral Daily  . ezetimibe  10 mg Oral Daily  . famotidine  20 mg Oral BID  . furosemide  40 mg Intravenous BID  . magnesium oxide  400 mg Oral BID  . rosuvastatin  10 mg Oral q1800  . sertraline  50 mg Oral QHS  .  sodium chloride flush  3 mL Intravenous Q12H   Continuous Infusions: . sodium chloride       LOS: 1 day        Mauricio Gerome Apley, MD

## 2020-07-31 NOTE — Progress Notes (Signed)
Heart Failure Stewardship Pharmacist Progress Note   PCP: Jinny Sanders, MD PCP-Cardiologist: No primary care provider on file.    HPI:  70 yo F with PMH of afib, NICM, ICD, hx of cardiac arrest, and CKD IV. She presented to the ED on 07/30/20 with progressive dyspnea, PND and orthopnea. She was admitted for acute HF exacerbation. Her last ECHO was done on 04/14/20 and LVEF was 25-30%.   Current HF Medications: Furosemide 40 mg IV BID Carvedilol 6.25 mg BID  Prior to admission HF Medications: Torsemide 5 mg every MWF Carvedilol 6.25 mg BID  Pertinent Lab Values: . Serum creatinine 2.89, BUN 18, Potassium 3.9, Sodium 140, BNP 1856, Magnesium 2.5   Vital Signs: . Weight: 139 lbs (admission weight: 139 lbs) . Blood pressure: 130/60s  . Heart rate: 70s   Medication Assistance / Insurance Benefits Check: Does the patient have prescription insurance?  Yes Type of insurance plan: [employer]  Does the patient qualify for medication assistance through manufacturers or grants?   Pending . Eligible grants and/or patient assistance programs: pending . Medication assistance applications in progress: none  . Medication assistance applications approved: none Approved medication assistance renewals will be completed by: TBD  Outpatient Pharmacy:  [facility] Is the patient willing to use [facility] at discharge? Yes Is the patient willing to transition their outpatient pharmacy to utilize a [facility] outpatient pharmacy?   Pending    Assessment: 1. Acute on chronic systolic CHF (EF 54-62%), due to NICM. NYHA class III symptoms. - Continue furosemide 40 mg IV BID - Continue carvedilol 6.25 mg BID - No ACE/ARB/ARNI, spironolactone, or SGLT2i with CKD IV. SCr 2.89 (ranges from 2.59-3.31 over the last few months). - Consider starting hydralazine + Imdur (or BiDil) following diuresis to optimize GDMT   Plan: 1) Medication changes recommended at  this time: - Continue diuresis  2) Patient assistance: - BiDil copay $100 per month (can enroll in patient assistance if needed) - Hydralazine copay $2.78-6.05 per month (depending on dose selected) - Imdur copay $2.74-6.19 per month (depending on dose selected)  3)  Education  - To be completed prior to discharge  Kerby Nora, PharmD, BCPS Heart Failure Stewardship Pharmacist Phone [phone]

## 2020-08-01 ENCOUNTER — Inpatient Hospital Stay (HOSPITAL_COMMUNITY): Payer: Medicare HMO

## 2020-08-01 DIAGNOSIS — R0600 Dyspnea, unspecified: Secondary | ICD-10-CM

## 2020-08-01 DIAGNOSIS — J9601 Acute respiratory failure with hypoxia: Secondary | ICD-10-CM

## 2020-08-01 LAB — ECHOCARDIOGRAM COMPLETE
Area-P 1/2: 2.17 cm2
Calc EF: 30.2 %
Height: 64 in
S' Lateral: 4.2 cm
Single Plane A2C EF: 28.4 %
Single Plane A4C EF: 28.7 %
Weight: 2077.62 oz

## 2020-08-01 LAB — GLUCOSE, CAPILLARY
Glucose-Capillary: 90 mg/dL (ref 70–99)
Glucose-Capillary: 100 mg/dL — ABNORMAL HIGH (ref 70–99)

## 2020-08-01 LAB — BASIC METABOLIC PANEL
Anion gap: 12 (ref 5–15)
BUN: 24 mg/dL — ABNORMAL HIGH (ref 8–23)
CO2: 29 mmol/L (ref 22–32)
Calcium: 9 mg/dL (ref 8.9–10.3)
Chloride: 99 mmol/L (ref 98–111)
Creatinine, Ser: 2.88 mg/dL — ABNORMAL HIGH (ref 0.44–1.00)
GFR, Estimated: 17 mL/min — ABNORMAL LOW (ref >60)
Glucose, Bld: 94 mg/dL (ref 70–99)
Potassium: 3.6 mmol/L (ref 3.5–5.1)
Sodium: 140 mmol/L (ref 135–145)

## 2020-08-01 MED ORDER — DIPHENHYDRAMINE HCL 25 MG PO CAPS
25.0000 mg | ORAL_CAPSULE | Freq: Once | ORAL | Status: AC | PRN
  Administered 2020-08-01: 25 mg via ORAL
  Filled 2020-08-01: qty 1

## 2020-08-01 MED ORDER — PERFLUTREN LIPID MICROSPHERE
1.0000 mL | INTRAVENOUS | Status: AC | PRN
  Administered 2020-08-01: 2 mL via INTRAVENOUS
  Filled 2020-08-01: qty 10

## 2020-08-01 MED ORDER — FAMOTIDINE 20 MG PO TABS
20.0000 mg | ORAL_TABLET | Freq: Every day | ORAL | Status: DC
  Administered 2020-08-02 – 2020-08-03 (×2): 20 mg via ORAL
  Filled 2020-08-01 (×2): qty 1

## 2020-08-01 NOTE — Progress Notes (Signed)
PROGRESS NOTE  [name] YQM:[id number] DOB: [birth year] DOA: 07/30/2020 PCP: Janice Sanders, MD  Brief History    70 year old female with past medical history for paroxysmal atrial relation, systolic heart failure status post AICD, nonischemic cardiomyopathy, and stage IV chronic kidney disease who presented with dyspnea.  Progressive and worsening dyspnea for the last 7 days, associated with PND and orthopnea.  No lower extremity edema.  On her initial physical examination blood pressure 150/83, temperature 98.2, heart rate 71, respiratory rate 18, oxygen saturation 90% on room air, lungs have decreased air entry bilaterally with bilateral rales, heart S1-S2, irregularly irregular, abdomen soft, no lower extremity edema.  Sodium 137, potassium 4.1, chloride 100, bicarb 26, glucose 136, BUN 18, creatinine 2.90, BNP 1856, troponin I 28, white count 6.0, hemoglobin 0.2, hematocrit 34.5, platelets 196. SARS COVID-19 negative.  Chest radiograph with cardiomegaly, bilateral interstitial infiltrates predominantly at bases with small bilateral pleural effusions.  EKG 92 bpm, left axis deviation, atrial pacing, ventricular pacing, prolonged QRS, no ST segment or T wave changes.  [facility]ists were consulted to admit the patient for further evaluation and treatment.   Echocardiogram is pending.  Consultants  . None  Procedures  . None  Antibiotics   Anti-infectives (From admission, onward)   None    .  Subjective  The patient is resting comfortably. No new complaitns.  Objective   Vitals:  Vitals:   08/01/20 1227 08/01/20 1653  BP: 128/84 (!) 114/58  Pulse: 76 69  Resp: 18 18  Temp: 97.7 F (36.5 C) 97.7 F (36.5 C)  SpO2: 95% 92%   Exam:  Constitutional:  . The patient is awake, alert, and oriented x 3. No acute distress. Respiratory:  . No increased work of breathing. . No wheezes or rhonchi . Scattered rales throughout . No tactile  fremitus Cardiovascular:  . Regular rate and rhythm . No murmurs, ectopy, or gallups. . No lateral PMI. No thrills. Abdomen:  . Abdomen is soft, non-tender, non-distended . No hernias, masses, or organomegaly . Normoactive bowel sounds.  Musculoskeletal:  . No cyanosis, clubbing, or edema Skin:  . No rashes, lesions, ulcers . palpation of skin: no induration or nodules Neurologic:  . CN 2-12 intact . Sensation all 4 extremities intact Psychiatric:  . Mental status o Mood, affect appropriate o Orientation to person, place, time  . judgment and insight appear intact   I have personally reviewed the following:   Today's Data  . Vitals, CBC, BMP  Imaging  . CXR  Cardiology Data  . EKG . Echopending  Scheduled Meds: . apixaban  5 mg Oral BID  . aspirin  81 mg Oral Daily  . carvedilol  6.25 mg Oral BID WC  . cholecalciferol  1,000 Units Oral Daily  . ezetimibe  10 mg Oral Daily  . [START ON 08/02/2020] famotidine  20 mg Oral Daily  . furosemide  40 mg Intravenous BID  . magnesium oxide  400 mg Oral BID  . rosuvastatin  10 mg Oral q1800  . sertraline  50 mg Oral QHS  . sodium chloride flush  3 mL Intravenous Q12H   Continuous Infusions: . sodium chloride      Principal Problem:   Acute respiratory failure with hypoxemia (HCC) Active Problems:   Essential hypertension   HLD (hyperlipidemia)   GERD (gastroesophageal reflux disease)   Acute on chronic combined systolic and diastolic congestive heart failure (HCC)   CKD (chronic kidney disease) stage 4, GFR 15-29 ml/min (  [name])   LOS: 2 days   A & P  1. Acute decompensated systolic heart failure, complicated with acute hypoxemic respiratory failure.  Continue to have dyspnea and hypervolemia.  Urine output not documented, systolic blood pressure 080 mmHg.   Continue diuresis with furosemide 40 mg Iv q12 hr to target further negative fluid balance.  Heart failure management with carvedilol.  Echocardiogram  from 10/21, EF 25 to 35%, with severe global hypokinesis.   Follow on repeat echocardiogram.   2. CKD stage 4. Stable renal function with serum cr at 2.89 with K at 3,9 and bicarbonate at 28. Continue diuresis with furosemide for diuresis.  Follow up on renal function in am.   3. Paroxysmal atrial fibrillation. Continue rate control with carvedilol. Anticoagulation with apixaban.   4. HTN. Continue blood pressure control with carvedilol.   5. Dyslipidemia continue with ezetimbide and rosuvastatin  6. Swallow dysfunction. Patient had a esophagogram as outpatient, 01/22 with significant stasis in the mid esophagus and proximal esophagus. No gress stricture or lesion.   7. Depression. Continue sertraline.    Patient continue to be at high risk for worsening heart failure   Status is: Inpatient  Remains inpatient appropriate because:IV treatments appropriate due to intensity of illness or inability to take PO   Dispo: The patient is from: Home  Anticipated d/c is to: Home  Anticipated d/c date is: 3 days  Patient currently is not medically stable to d/c.              Difficult to place patient No    DVT prophylaxis:      apixaban   Code Status:               DNR  Family Communication:  I spoke with patient's husband at the bedside, we talked in detail about patient's condition, plan of care and prognosis and all questions were addressed.   Ava Swayze, DO [facility] Direct contact: see [email or web address]  7PM-7AM contact night coverage as above 08/01/2020, 6:02 PM  LOS: 2 days

## 2020-08-01 NOTE — Progress Notes (Signed)
Heart Failure Stewardship Pharmacist Progress Note   PCP: Jinny Sanders, MD PCP-Cardiologist: No primary care provider on file.    HPI:  70 yo F with PMH of afib, NICM, ICD, hx of cardiac arrest, and CKD IV. She presented to the ED on 07/30/20 with progressive dyspnea, PND and orthopnea. She was admitted for acute HF exacerbation. Her last ECHO was done on 04/14/20 and LVEF was 25-30%. Repeat ECHO is pending.  Current HF Medications: Furosemide 40 mg IV BID Carvedilol 6.25 mg BID  Prior to admission HF Medications: Torsemide 5 mg every MWF Carvedilol 6.25 mg BID  Pertinent Lab Values: . Serum creatinine 2.88, BUN 24, Potassium 3.6, Sodium 140, BNP 1856   Vital Signs: . Weight: 129 lbs (admission weight: 139 lbs) . Blood pressure: 130/60s  . Heart rate: 70s   Medication Assistance / Insurance Benefits Check: Does the patient have prescription insurance?  Yes Type of insurance plan: [employer]  Does the patient qualify for medication assistance through manufacturers or grants?   Pending . Eligible grants and/or patient assistance programs: pending . Medication assistance applications in progress: none  . Medication assistance applications approved: none Approved medication assistance renewals will be completed by: TBD  Outpatient Pharmacy:  [facility] Is the patient willing to use [place] at discharge? Yes Is the patient willing to transition their outpatient pharmacy to utilize a [facility] outpatient pharmacy?   Pending    Assessment: 1. Acute on chronic systolic CHF (EF 83-35%), due to NICM. NYHA class III symptoms. - Continue furosemide 40 mg IV BID - Continue carvedilol 6.25 mg BID - No ACE/ARB/ARNI, spironolactone, or SGLT2i with CKD IV. SCr 2.88 (ranges from 2.59-3.31 over the last few months). - Consider starting hydralazine + Imdur (or BiDil) following diuresis to optimize GDMT   Plan: 1) Medication changes  recommended at this time: - Continue diuresis  2) Patient assistance: - BiDil copay $100 per month (can enroll in patient assistance if needed) - Hydralazine copay $2.78-6.05 per month (depending on dose selected) - Imdur copay $2.74-6.19 per month (depending on dose selected)  3)  Education  - To be completed prior to discharge  Kerby Nora, PharmD, BCPS Heart Failure Stewardship Pharmacist Phone [phone]

## 2020-08-01 NOTE — Progress Notes (Signed)
  Echocardiogram 2D Echocardiogram with definity has been performed.  Darlina Sicilian M 08/01/2020, 8:53 AM

## 2020-08-02 ENCOUNTER — Telehealth: Payer: Self-pay

## 2020-08-02 ENCOUNTER — Other Ambulatory Visit: Payer: Self-pay

## 2020-08-02 DIAGNOSIS — J9601 Acute respiratory failure with hypoxia: Secondary | ICD-10-CM | POA: Diagnosis not present

## 2020-08-02 DIAGNOSIS — K580 Irritable bowel syndrome with diarrhea: Secondary | ICD-10-CM

## 2020-08-02 LAB — BASIC METABOLIC PANEL
Anion gap: 15 (ref 5–15)
BUN: 32 mg/dL — ABNORMAL HIGH (ref 8–23)
CO2: 28 mmol/L (ref 22–32)
Calcium: 9.3 mg/dL (ref 8.9–10.3)
Chloride: 96 mmol/L — ABNORMAL LOW (ref 98–111)
Creatinine, Ser: 3.19 mg/dL — ABNORMAL HIGH (ref 0.44–1.00)
GFR, Estimated: 15 mL/min — ABNORMAL LOW (ref >60)
Glucose, Bld: 93 mg/dL (ref 70–99)
Potassium: 3.5 mmol/L (ref 3.5–5.1)
Sodium: 139 mmol/L (ref 135–145)

## 2020-08-02 LAB — GLUCOSE, CAPILLARY: Glucose-Capillary: 99 mg/dL (ref 70–99)

## 2020-08-02 NOTE — Progress Notes (Signed)
Heart Failure Stewardship Pharmacist Progress Note   PCP: Jinny Sanders, MD PCP-Cardiologist: No primary care provider on file.    HPI:  70 yo F with PMH of afib, NICM, ICD, hx of cardiac arrest, and CKD IV. She presented to the ED on 07/30/20 with progressive dyspnea, PND and orthopnea. She was admitted for acute HF exacerbation. Her last ECHO was done on 04/14/20 and LVEF was 25-30%. Repeat ECHO is pending.  Current HF Medications: Furosemide 40 mg IV BID Carvedilol 6.25 mg BID  Prior to admission HF Medications: Torsemide 5 mg every MWF Carvedilol 6.25 mg BID  Pertinent Lab Values: . Serum creatinine 3.19, BUN 32 Potassium 3.5, Sodium 139, BNP 1856   Vital Signs: . Weight: 127 lbs (admission weight: 139 lbs) . Blood pressure: 110-130/50s  . Heart rate: 60-70s   Medication Assistance / Insurance Benefits Check: Does the patient have prescription insurance?  Yes Type of insurance plan: [employer]  Does the patient qualify for medication assistance through manufacturers or grants?   Pending . Eligible grants and/or patient assistance programs: pending . Medication assistance applications in progress: none  . Medication assistance applications approved: none Approved medication assistance renewals will be completed by: TBD  Outpatient Pharmacy:  [facility] Is the patient willing to use [facility] at discharge? Yes Is the patient willing to transition their outpatient pharmacy to utilize a [facility] outpatient pharmacy?   Pending    Assessment: 1. Acute on chronic systolic CHF (EF 64-15%), due to NICM. NYHA class III symptoms. - Continue furosemide 40 mg IV BID. SCr up to 3.19 today. MD to assess volume status. - Continue carvedilol 6.25 mg BID - No ACE/ARB/ARNI, spironolactone, or SGLT2i with CKD IV. SCr up to 3.19 (ranges from 2.59-3.31 over the last few months). - Consider starting hydralazine + Imdur (or BiDil) following  diuresis to optimize GDMT once there is more BP room   Plan: 1) Medication changes recommended at this time: - Diuresis per MD  2) Patient assistance: - BiDil copay $100 per month (can enroll in patient assistance if needed) - Hydralazine copay $2.78-6.05 per month (depending on dose selected) - Imdur copay $2.74-6.19 per month (depending on dose selected)  3)  Education  - To be completed prior to discharge  Kerby Nora, PharmD, BCPS Heart Failure Stewardship Pharmacist Phone [phone]

## 2020-08-02 NOTE — Progress Notes (Signed)
PROGRESS NOTE  [name] FXT:[id number] DOB: [birth year] DOA: 07/30/2020 PCP: Jinny Sanders, MD  Brief History    70 year old female with past medical history for paroxysmal atrial relation, systolic heart failure status post AICD, nonischemic cardiomyopathy, and stage IV chronic kidney disease who presented with dyspnea.  Progressive and worsening dyspnea for the last 7 days, associated with PND and orthopnea.  No lower extremity edema.  On her initial physical examination blood pressure 150/83, temperature 98.2, heart rate 71, respiratory rate 18, oxygen saturation 90% on room air, lungs have decreased air entry bilaterally with bilateral rales, heart S1-S2, irregularly irregular, abdomen soft, no lower extremity edema.  Sodium 137, potassium 4.1, chloride 100, bicarb 26, glucose 136, BUN 18, creatinine 2.90, BNP 1856, troponin I 28, white count 6.0, hemoglobin 0.2, hematocrit 34.5, platelets 196. SARS COVID-19 negative.  Chest radiograph with cardiomegaly, bilateral interstitial infiltrates predominantly at bases with small bilateral pleural effusions.  EKG 92 bpm, left axis deviation, atrial pacing, ventricular pacing, prolonged QRS, no ST segment or T wave changes.  [facility] hospitalists were consulted to admit the patient for further evaluation and treatment.   Echocardiogram (08/01/2020): It has demonstrated EF of 25-30% . The LV has severely decreased function and global hypokinesis. There is Grade 1 diastolic dysfunction.   Consultants  . None  Procedures  . None  Antibiotics   Anti-infectives (From admission, onward)   None     Subjective  The patient is resting comfortably. The patient is concerned about ingrown toenail on left foot. She puts neosporin on it at home. She would like that here.  Objective   Vitals:  Vitals:   08/02/20 1436 08/02/20 1715  BP: (!) 108/56 (!) 91/54  Pulse: 70 70  Resp: 14   Temp: 98.6 F (37 C)   SpO2: 96%     Exam:  Constitutional:  . The patient is awake, alert, and oriented x 3. No acute distress. Respiratory:  . No increased work of breathing. . No wheezes or rhonchi . Scattered rales throughout . No tactile fremitus Cardiovascular:  . Regular rate and rhythm . No murmurs, ectopy, or gallups. . No lateral PMI. No thrills. Abdomen:  . Abdomen is soft, non-tender, non-distended . No hernias, masses, or organomegaly . Normoactive bowel sounds.  Musculoskeletal:  . No cyanosis, clubbing, or edema . There is some perinychial erythema on distal aspect of left great toe. Skin:  . No rashes, lesions, ulcers . palpation of skin: no induration or nodules . Erythema on left great toe as noted above. Neurologic:  . CN 2-12 intact . Sensation all 4 extremities intact Psychiatric:  . Mental status o Mood, affect appropriate o Orientation to person, place, time  . judgment and insight appear intact   I have personally reviewed the following:   Today's Data  . Vitals, CBC, BMP  Imaging  . CXR  Cardiology Data  . EKG . Echopending  Scheduled Meds: . apixaban  5 mg Oral BID  . aspirin  81 mg Oral Daily  . carvedilol  6.25 mg Oral BID WC  . cholecalciferol  1,000 Units Oral Daily  . ezetimibe  10 mg Oral Daily  . famotidine  20 mg Oral Daily  . furosemide  40 mg Intravenous BID  . magnesium oxide  400 mg Oral BID  . rosuvastatin  10 mg Oral q1800  . sertraline  50 mg Oral QHS  . sodium chloride flush  3 mL Intravenous Q12H   Continuous Infusions: .  sodium chloride      Principal Problem:   Acute respiratory failure with hypoxemia (HCC) Active Problems:   Essential hypertension   HLD (hyperlipidemia)   GERD (gastroesophageal reflux disease)   Acute on chronic combined systolic and diastolic congestive heart failure (HCC)   CKD (chronic kidney disease) stage 4, GFR 15-29 ml/min (HCC)   LOS: 3 days   A & P  1. Acute decompensated systolic heart failure,  complicated with acute hypoxemic respiratory failure.  Continue to have dyspnea and hypervolemia.  Urine output not documented, systolic blood pressure 412 mmHg.   Continue diuresis with furosemide 40 mg Iv q12 hr to target further negative fluid balance.  Heart failure management with carvedilol. Parameters placed. Echocardiogram from 08/01/2020, EF 25 to 35%, with severe global hypokinesis.   The patient's fluid balance is negative 1472 today.   2. CKD stage 4. Worsening renal function. Baseline serum cr at 2.89 with K at 3,9 and bicarbonate at 28. Today creatinine is increased to 3.19.  Continue diuresis with furosemide for diuresis.  Follow creatinine, electrolytes and volume status. Consider nephrology consult if renal function continues to decline.  3. Paroxysmal atrial fibrillation. Continue rate control with carvedilol. Anticoagulation with apixaban.   4. HTN. Continue blood pressure control with carvedilol.   5. Dyslipidemia continue with ezetimbide and rosuvastatin  6. Swallow dysfunction. Patient had a esophagogram as outpatient, 01/22 with significant stasis in the mid esophagus and proximal esophagus. No gress stricture or lesion.   7. Depression. Continue sertraline.   Patient continue to be at high risk for worsening heart failure.  Status is: Inpatient  Remains inpatient appropriate because:IV treatments appropriate due to intensity of illness or inability to take PO  Dispo: The patient is from: Home  Anticipated d/c is to: Home  Anticipated d/c date is: 3 days  Patient currently is not medically stable to d/c.              Difficult to place patient No  DVT prophylaxis:      apixaban   Code Status:               DNR  Family Communication:  I spoke with patient's husband at the bedside, we talked in detail about patient's condition, plan of care and prognosis and all questions were addressed.  Lydiah Pong, DO [facility] Direct contact: see [email or web address]  7PM-7AM contact night coverage as above 08/02/2020, 5:32 PM  LOS: 2 days

## 2020-08-02 NOTE — Telephone Encounter (Signed)
Called and spoke to daughter [name]). Patient is currently in the hospital with Resp. Failure. Order in [place] for G.I. Path Panel. Daughter will bring her Mom into our lab for that when she is out of the hospital, if she is still having diarrhea. Scheduled office visit with Dr. Silverio Decamp for 08/10/20, patient to arrive at 2:00pm

## 2020-08-03 ENCOUNTER — Other Ambulatory Visit (HOSPITAL_COMMUNITY): Payer: Self-pay | Admitting: Internal Medicine

## 2020-08-03 DIAGNOSIS — J9601 Acute respiratory failure with hypoxia: Secondary | ICD-10-CM | POA: Diagnosis not present

## 2020-08-03 LAB — BASIC METABOLIC PANEL
Anion gap: 12 (ref 5–15)
BUN: 35 mg/dL — ABNORMAL HIGH (ref 8–23)
CO2: 32 mmol/L (ref 22–32)
Calcium: 9.2 mg/dL (ref 8.9–10.3)
Chloride: 94 mmol/L — ABNORMAL LOW (ref 98–111)
Creatinine, Ser: 3.25 mg/dL — ABNORMAL HIGH (ref 0.44–1.00)
GFR, Estimated: 15 mL/min — ABNORMAL LOW (ref >60)
Glucose, Bld: 95 mg/dL (ref 70–99)
Potassium: 3.3 mmol/L — ABNORMAL LOW (ref 3.5–5.1)
Sodium: 138 mmol/L (ref 135–145)

## 2020-08-03 MED ORDER — DOCUSATE SODIUM 100 MG PO CAPS
200.0000 mg | ORAL_CAPSULE | Freq: Once | ORAL | Status: AC
  Administered 2020-08-03: 200 mg via ORAL
  Filled 2020-08-03: qty 2

## 2020-08-03 MED ORDER — CARVEDILOL 3.125 MG PO TABS: 3.1250 mg | ORAL_TABLET | Freq: Two times a day (BID) | ORAL | Status: DC

## 2020-08-03 MED ORDER — FUROSEMIDE 40 MG PO TABS: 40.0000 mg | ORAL_TABLET | Freq: Every day | ORAL | 11 refills | Status: DC

## 2020-08-03 MED ORDER — APIXABAN 2.5 MG PO TABS: 2.5000 mg | ORAL_TABLET | Freq: Two times a day (BID) | ORAL | 0 refills | Status: DC

## 2020-08-03 MED ORDER — FUROSEMIDE 20 MG PO TABS: 20.0000 mg | ORAL_TABLET | Freq: Two times a day (BID) | ORAL | 11 refills | Status: DC

## 2020-08-03 MED ORDER — POLYETHYLENE GLYCOL 3350 17 G PO PACK: 17.0000 g | PACK | Freq: Every day | ORAL | 0 refills | Status: DC

## 2020-08-03 MED ORDER — APIXABAN 2.5 MG PO TABS: 2.5000 mg | ORAL_TABLET | Freq: Two times a day (BID) | ORAL | Status: DC

## 2020-08-03 MED ORDER — POLYETHYLENE GLYCOL 3350 17 G PO PACK
17.0000 g | PACK | Freq: Every day | ORAL | Status: DC
  Filled 2020-08-03: qty 1

## 2020-08-03 MED ORDER — POTASSIUM CHLORIDE ER 20 MEQ PO TBCR: 20.0000 meq | EXTENDED_RELEASE_TABLET | Freq: Every day | ORAL | 0 refills | Status: DC

## 2020-08-03 MED ORDER — POTASSIUM CHLORIDE ER 20 MEQ PO TBCR: 10.0000 meq | EXTENDED_RELEASE_TABLET | Freq: Every day | ORAL | 0 refills | Status: DC

## 2020-08-03 MED ORDER — FUROSEMIDE 20 MG PO TABS: 20.0000 mg | ORAL_TABLET | Freq: Two times a day (BID) | ORAL | 0 refills | Status: DC

## 2020-08-03 MED ORDER — CARVEDILOL 3.125 MG PO TABS: 3.1250 mg | ORAL_TABLET | Freq: Two times a day (BID) | ORAL | 0 refills | Status: DC

## 2020-08-03 MED ORDER — POTASSIUM CHLORIDE CRYS ER 20 MEQ PO TBCR
40.0000 meq | EXTENDED_RELEASE_TABLET | Freq: Once | ORAL | Status: AC
  Administered 2020-08-03: 40 meq via ORAL
  Filled 2020-08-03: qty 2

## 2020-08-03 MED FILL — ELIQUIS 2.5 MG TABLET: 2.5 | 30 days supply | Qty: 60 | Fill #0

## 2020-08-03 MED FILL — POLYETHYLENE GLYCOL 3350 PO: 17 | 14 days supply | Qty: 238 | Fill #0

## 2020-08-03 MED FILL — FUROSEMIDE 40 MG TABLET: 40 | 30 days supply | Qty: 30 | Fill #0

## 2020-08-03 MED FILL — FUROSEMIDE 20 MG TAB: 20 | 30 days supply | Qty: 60 | Fill #0

## 2020-08-03 MED FILL — POTASSIUM CHLORIDE 20meqER: 20 | 30 days supply | Qty: 30 | Fill #0

## 2020-08-03 MED FILL — CARVEDILOL 3.125 MG TABLET: 3.125 | 30 days supply | Qty: 60 | Fill #0

## 2020-08-03 NOTE — TOC Initial Note (Signed)
Transition of Care [facility]) - Initial/Assessment Note    Patient Details  Name: [name] MRN: [id number] Date of Birth: August 01, 1950  Transition of Care [facility]) CM/SW Contact:    Bethena Roys, RN Phone Number: 08/03/2020, 4:36 PM  Clinical Narrative:  Risk for readmission assessment completed. Patient presented for shortness of breath. Has a history of CHF-and Case Manager received a consult for home health heart failure referral. Case Manager discussed home health services with the patient and she declined services at this time. Patient states she has a primary care provider Warehouse manager did make her aware that if she needs services in the future to contact her PCP. Patient states she was independent prior to admission and takes her medications appropriately. No further needs identified at this time.               Expected Discharge Plan: Home/Self Care Barriers to Discharge: No Barriers Identified   Patient Goals and CMS Choice Patient states their goals for this hospitalization and ongoing recovery are:: to return home   Choice offered to / list presented to : NA  Expected Discharge Plan and Services Expected Discharge Plan: Home/Self Care In-house Referral: NA Discharge Planning Services: CM Consult Post Acute Care Choice: NA Living arrangements for the past 2 months: Single Family Home Expected Discharge Date: 08/03/20               DME Arranged: N/A DME Agency: NA       HH Arranged: Refused HH (Pt declined the need for home health services.)          Prior Living Arrangements/Services Living arrangements for the past 2 months: Single Family Home Lives with:: Spouse Patient language and need for interpreter reviewed:: Yes Do you feel safe going back to the place where you live?: Yes      Need for Family Participation in Patient Care: Yes (Comment) Care giver support system in place?: Yes (comment)   Criminal Activity/Legal Involvement Pertinent  to Current Situation/Hospitalization: No - Comment as needed  Activities of Daily Living Home Assistive Devices/Equipment: Eyeglasses ADL Screening (condition at time of admission) Patient's cognitive ability adequate to safely complete daily activities?: Yes Is the patient deaf or have difficulty hearing?: No Does the patient have difficulty seeing, even when wearing glasses/contacts?: No Does the patient have difficulty concentrating, remembering, or making decisions?: No Patient able to express need for assistance with ADLs?: Yes Does the patient have difficulty dressing or bathing?: No Independently performs ADLs?: Yes (appropriate for developmental age) Communication: Independent Dressing (OT): Independent Grooming: Independent Feeding: Independent Bathing: Independent Toileting: Independent In/Out Bed: Independent Walks in Home: Independent Does the patient have difficulty walking or climbing stairs?: No Weakness of Legs: None Weakness of Arms/Hands: None  Permission Sought/Granted Permission sought to share information with : Family Nature conservation officer                Emotional Assessment Appearance:: Appears stated age Attitude/Demeanor/Rapport: Engaged Affect (typically observed): Appropriate Orientation: : Oriented to  Time,Oriented to Place,Oriented to Self Alcohol / Substance Use: Not Applicable    Admission diagnosis:  Acute respiratory failure with hypoxia ([place]) [J96.01] Acute respiratory failure with hypoxemia ([place]) [J96.01] Acute on chronic congestive heart failure, unspecified heart failure type ([place]) [I50.9] Patient Active Problem List   Diagnosis Date Noted  . Acute respiratory failure with hypoxemia ([place]) 07/30/2020  . Pleural effusion 06/13/2020  . Shortness of breath   . Acute on chronic combined systolic and diastolic congestive  heart failure ([place]) 06/12/2020  . CKD (chronic kidney disease) stage 4, GFR 15-29 ml/min (HCC) 06/12/2020  . DNR (do  not resuscitate) 06/12/2020  . Normocytic anemia 06/12/2020  . Encounter for assessment of implantable cardioverter-defibrillator (ICD) 11/10/2019  . H/O cardiac arrest 08/03/2019 11/10/2019  . Coronary artery disease involving native coronary artery of native heart without angina pectoris 11/03/2019  . Acute on chronic systolic CHF (congestive heart failure) ([place]) 11/03/2019  . ICD BiV Medtronic model [id number] [place] Quad CRT-D SureScan ICD 08/25/2019   . Cardiac arrest with ventricular fibrillation ([place]) 08/24/2019  . Long term current use of anticoagulant 08/14/2019  . Cardiomyopathy ([place]) 08/14/2019  . Paroxysmal atrial fibrillation ([place]) 08/06/2019  . Dysphagia   . History of CVA (cerebrovascular accident) 08/02/2019  . Left bundle branch block 08/02/2019  . CKD (chronic kidney disease) stage 3, GFR 30-59 ml/min ([place]) 12/13/2018  . Primary localized osteoarthritis of right knee 02/17/2017  . Vitamin B12 deficiency 09/10/2015  . Insomnia 07/16/2015  . GERD (gastroesophageal reflux disease) 03/26/2015  . HLD (hyperlipidemia) 11/19/2011  . Adjustment disorder 01/08/2011  . Former smoker 12/19/2008  . Essential hypertension 12/19/2008   PCP:  Jinny Sanders, MD Pharmacy:   [place], [place] - [address], [address], [place] [address] Phone: [phone] Fax: [phone]  [id number] - [place], [place] [address] [place] [address] Phone: [phone] Fax: [phone]  [facility] [place], [place] [address] [place] [address] Phone: [phone] Fax: [phone]     Readmission Risk Interventions Readmission Risk Prevention Plan 08/03/2020  Transportation Screening Complete  PCP or Specialist Appt within 3-5 Days Complete  HRI or Home Care Consult Complete  Social Work Consult for [place] Planning/Counseling  Complete  Palliative Care Screening Not Applicable  Medication Review Press photographer) Complete  Some recent data might be hidden

## 2020-08-03 NOTE — Progress Notes (Signed)
Held Coreg for BP 97/46 HR70.  Idolina Primer, RN

## 2020-08-03 NOTE — Care Management Important Message (Signed)
Important Message  Patient Details  Name: [name] MRN: [id number] Date of Birth: 12-Aug-1950   Medicare Important Message Given:  Yes     Shelda Altes 08/03/2020, 9:20 AM

## 2020-08-03 NOTE — Progress Notes (Signed)
SATURATION QUALIFICATIONS: (This note is used to comply with regulatory documentation for home oxygen)  Patient Saturations on Room Air at Rest = 93%  Patient Saturations on Room Air while Ambulating = 95%  Patient Saturations on n/a Liters of oxygen while Ambulating = n/a%  Please briefly explain why patient needs home oxygen: Pt does not require O2.

## 2020-08-03 NOTE — Plan of Care (Signed)

## 2020-08-03 NOTE — Progress Notes (Signed)
Heart Failure Stewardship Pharmacist Progress Note   PCP: Jinny Sanders, MD PCP-Cardiologist: No primary care provider on file.    HPI:  70 yo F with PMH of afib, NICM, ICD, hx of cardiac arrest, and CKD IV. She presented to the ED on 07/30/20 with progressive dyspnea, PND and orthopnea. She was admitted for acute HF exacerbation. Her last ECHO was done on 04/14/20 and LVEF was 25-30%. Repeat ECHO on 08/01/20 and LVEF remains 25-30%.  Current HF Medications: Furosemide 40 mg IV BID Carvedilol 3.125 mg BID  Prior to admission HF Medications: Torsemide 5 mg every MWF Carvedilol 6.25 mg BID  Pertinent Lab Values: . Serum creatinine 3.25, BUN 35, Potassium 3.3, Sodium 139, BNP 1856   Vital Signs: . Weight: 126 lbs (admission weight: 139 lbs) . Blood pressure: 100-120/50s  . Heart rate: 70s   Medication Assistance / Insurance Benefits Check: Does the patient have prescription insurance?  Yes Type of insurance plan: [place] [employer]  Does the patient qualify for medication assistance through manufacturers or grants?   Pending . Eligible grants and/or patient assistance programs: pending . Medication assistance applications in progress: none  . Medication assistance applications approved: none Approved medication assistance renewals will be completed by: TBD  Outpatient Pharmacy:  [facility] Is the patient willing to use [place] at discharge? Yes Is the patient willing to transition their outpatient pharmacy to utilize a [facility] outpatient pharmacy?   Pending    Assessment: 1. Acute on chronic systolic CHF (EF 13-14%), due to NICM. NYHA class III symptoms. - Continue furosemide 40 mg IV BID. SCr up to 3.25 today. MD to assess volume status. Nephrology may be consulted. - Carvedilol reduced to 3.125 mg BID today - No ACE/ARB/ARNI, spironolactone, or SGLT2i with CKD IV. SCr up to 3.25 (ranges from 2.59-3.31 over the last few months). -  Consider starting hydralazine + Imdur (or BiDil) following diuresis to optimize GDMT once there is more BP room   Plan: 1) Medication changes recommended at this time: - Diuresis per MD - GDMT limited by CKD IV  2) Patient assistance: - BiDil copay $100 per month (can enroll in patient assistance if needed) - Hydralazine copay $2.78-6.05 per month (depending on dose selected) - Imdur copay $2.74-6.19 per month (depending on dose selected)  3)  Education  - To be completed prior to discharge  Kerby Nora, PharmD, BCPS Heart Failure Stewardship Pharmacist Phone [phone]

## 2020-08-03 NOTE — Discharge Summary (Addendum)
Physician Discharge Summary  [name]:[id number] DOB: 10/02/1950 DOA: 07/30/2020  PCP: Jinny Sanders, MD  Admit date: 07/30/2020 Discharge date: 08/03/2020  Recommendations for Outpatient Follow-up:  1. Discharge to home. 2. Follow up with PCP in 7-10 days. Have chemistry checked on that visit and reported to PCP. 3. Follow up with cardiology in 2-4 weeks.  Discharge Diagnoses: Principal diagnosis is #1 1. Acute decompensated CHF 2. Acute hypoxic respiratory failure 3. CKD IV 4. Paroxysmal atrial fibrillation 5. Hypertension 6. Hyperlipidemia 7. Dysphagia  Discharge Condition: Fair  Disposition: Home  Diet recommendation: heart healthy  Filed Weights   08/01/20 0441 08/02/20 0345 08/03/20 0602  Weight: 58.9 kg 57.8 kg 57.2 kg    History of present illness: [name] is a 70 y.o. 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  Symptoms have been going on over the last 1 week.  Associated with PND and orthopnea.  This has gotten progressively worse over the last few days.  Denied any fever or chills.  No COVID-19 exposure, no pedal edema.  No chills.  Patient was recently diagnosed with systolic dysfunction CHF after EF of 25 to 30% last October.  She has never had pedal swelling with her symptoms.  She is told us not have one today.  BNP seems to be close to baseline.  Patient being admitted with acute exacerbation of her systolic dysfunction CHF.[name]  ED Course: Temperature 98.2 blood pressure 150/83 pulse 71 respirate of 18 oxygen sats 90% on room air.  CBC showed hemoglobin 11.2 chemistry showed creatinine 2.90, BNP of 1856.  Troponin XX 8.  Glucose is 136 gap of 11 magnesium  2.5.  Lactic acid 0.8.  COVID-19 screen is negative.  Chest x-ray shows cardiomegaly with interstitial thickening suspicious for heart failure with some increased right and new small left pleural effusions.  EKG is unchanged from previous.  Patient being admitted for further evaluation and treatment of systolic dysfunction CHF.   Hospital Course:  70 year old female with past medical history for paroxysmal atrial relation, systolic heart failure status post AICD, nonischemic cardiomyopathy,andstage IV chronic kidney disease who presented with dyspnea. Progressive and worsening dyspnea for the last 7 days, associated with PND and orthopnea. No lower extremity edema. On her initial physical examination blood pressure 150/83, temperature 98.2, heart rate 71, respiratory rate 18, oxygen saturation 90% on room air, lungs have decreased air entry bilaterally with bilateral rales, heart S1-S2, irregularly irregular, abdomen soft, no lower extremity edema.  Sodium 137, potassium 4.1, chloride 100, bicarb 26, glucose 136, BUN 18, creatinine 2.90, BNP 1856, troponin I 28, white count 6.0, hemoglobin 0.2, hematocrit 34.5, platelets 196. SARS COVID-19 negative.  Chest radiograph with cardiomegaly, bilateral interstitial infiltrates predominantly at bases with small bilateral pleural effusions.  EKG 92 bpm, left axis deviation, atrial pacing, ventricular pacing, prolonged QRS,no ST segment or T wave changes.  [facility] were consulted to admit the patient for further evaluation and treatment.   Echocardiogram (08/01/2020): It has demonstrated EF of 25-30% . The LV has severely decreased function and global hypokinesis. There is Grade 1 diastolic dysfunction.   The patient has responded well to diuresis. Due to hypotension, her coreg was reduced to 3.125 bid. No ACE I was given due to elevated  creatinine. The patient's respiratory status has returned to baseline (95% on room air). She is1756 cc  negative in terms of fluid balance. She will be discharged to home in fair condition.   Today's assessment: S: The patient states that she is feeling well No new complaints. O: Vitals:  Vitals:   08/03/20 0832 08/03/20 1321  BP: (!) 97/46 (!) 111/55  Pulse:  70  Resp:    Temp:  (!) 97.5 F (36.4 C)  SpO2:  93%    Exam:  Constitutional:  . The patient is awake, alert, and oriented x 3. No acute distress. Respiratory:  . No increased work of breathing. . No wheezes, rales, or rhonchi . No tactile fremitus Cardiovascular:  . Regular rate and rhythm . No murmurs, ectopy, or gallups. . No lateral PMI. No thrills. Abdomen:  . Abdomen is soft, non-tender, non-distended . No hernias, masses, or organomegaly . Normoactive bowel sounds.  Musculoskeletal:  . No cyanosis, clubbing, or edema Skin:  . No rashes, lesions, ulcers . palpation of skin: no induration or nodules Neurologic:  . CN 2-12 intact . Sensation all 4 extremities intact Psychiatric:  . Mental status o Mood, affect appropriate o Orientation to person, place, time  . judgment and insight appear intact  Discharge Instructions  Discharge Instructions    (HEART FAILURE PATIENTS) Call MD:  Anytime you have any of the following symptoms: 1) 3 pound weight gain in 24 hours or 5 pounds in 1 week 2) shortness of breath, with or without a dry hacking cough 3) swelling in the hands, feet or stomach 4) if you have to sleep on extra pillows at night in order to breathe.   Complete by: As directed    Call MD for:  difficulty breathing, headache or visual disturbances   Complete by: As directed    Call MD for:  extreme fatigue   Complete by: As directed    Call MD for:  persistant dizziness or light-headedness   Complete by: As directed    Diet - low sodium heart healthy   Complete by: As directed    Discharge instructions   Complete by: As directed    Discharge to home. Follow up with PCP in 7-10 days. Have  chemistry checked on that visit and reported to PCP. Follow up with cardiology in 2-4 weeks.   Heart Failure patients record your daily weight using the same scale at the same time of day   Complete by: As directed    Increase activity slowly   Complete by: As directed      Allergies as of 08/03/2020      Reactions   Shellfish-derived Products Anaphylaxis   Ativan [lorazepam] Other (See Comments)   Makes "skin crawl" and insomnia   Buspar [buspirone] Nausea Only   Sweating and dizzy   Clarithromycin Swelling   Codeine Nausea And Vomiting   Doxycycline Other (See Comments)   Unknown   Guaifenesin Other (See Comments)   Palpitations   Oxycodone Nausea And Vomiting   Prednisone Nausea And Vomiting, Other (See Comments)   Makes my heart race   Statins Other (See Comments)   Muscle pain   Sulfonamide Derivatives Nausea And Vomiting   Achiness   Tetracycline Nausea Only   Vicodin [hydrocodone-acetaminophen] Nausea And Vomiting   Famotidine Rash   Latex Rash   Omeprazole Nausea Only   Cough, shortness of breath - patient doesn't remember   Peanut-containing Drug Products Itching, Rash   Protonix [pantoprazole] Rash  Wellbutrin [bupropion] Palpitations      Medication List    STOP taking these medications   torsemide 5 MG tablet Commonly known as: DEMADEX     TAKE these medications   acetaminophen 500 MG tablet Commonly known as: TYLENOL Take 1,000 mg by mouth 2 (two) times daily as needed (pain).   albuterol 108 (90 Base) MCG/ACT inhaler Commonly known as: VENTOLIN HFA Inhale 2 puffs into the lungs every 2 (two) hours as needed for wheezing or shortness of breath.   apixaban 2.5 MG Tabs tablet Commonly known as: ELIQUIS Take 1 tablet (2.5 mg total) by mouth 2 (two) times daily. What changed:   medication strength  how much to take   aspirin 81 MG chewable tablet Chew 1 tablet (81 mg total) by mouth daily.   carvedilol 3.125 MG tablet Commonly known as:  COREG Take 1 tablet (3.125 mg total) by mouth 2 (two) times daily with a meal. What changed:   medication strength  how much to take   ezetimibe 10 MG tablet Commonly known as: ZETIA TAKE 1 TABLET BY MOUTH EVERY DAY   furosemide 20 MG tablet Commonly known as: Lasix Take 1 tablet (20 mg total) by mouth 2 (two) times daily.   Magnesium 400 MG Tabs Take 400 mg by mouth 2 (two) times daily.   nitroGLYCERIN 0.4 MG SL tablet Commonly known as: NITROSTAT Place 1 tablet (0.4 mg total) under the tongue every 5 (five) minutes as needed for chest pain.   ondansetron 4 MG disintegrating tablet Commonly known as: Zofran ODT Take 1 tablet (4 mg total) by mouth every 8 (eight) hours as needed for nausea or vomiting.   Pepcid 20 MG tablet Generic drug: famotidine Take 1 tablet (20 mg total) by mouth 2 (two) times daily.   polyethylene glycol 17 g packet Commonly known as: MIRALAX / GLYCOLAX Take 17 g by mouth daily. Start taking on: August 04, 2020   Potassium Chloride ER 20 MEQ Tbcr Take 20 mEq by mouth daily.   rosuvastatin 10 MG tablet Commonly known as: CRESTOR TAKE 1 TABLET (10 MG TOTAL) BY MOUTH DAILY AT 6 PM.   sertraline 50 MG tablet Commonly known as: ZOLOFT Take 50 mg by mouth at bedtime.   VITAMIN D3 PO Take 1,000 Units by mouth in the morning and at bedtime.      Allergies  Allergen Reactions  . Shellfish-Derived Products Anaphylaxis  . Ativan [Lorazepam] Other (See Comments)    Makes "skin crawl" and insomnia  . Buspar [Buspirone] Nausea Only    Sweating and dizzy  . Clarithromycin Swelling  . Codeine Nausea And Vomiting  . Doxycycline Other (See Comments)    Unknown  . Guaifenesin Other (See Comments)    Palpitations  . Oxycodone Nausea And Vomiting  . Prednisone Nausea And Vomiting and Other (See Comments)    Makes my heart race  . Statins Other (See Comments)    Muscle pain  . Sulfonamide Derivatives Nausea And Vomiting    Achiness  .  Tetracycline Nausea Only  . Vicodin [Hydrocodone-Acetaminophen] Nausea And Vomiting  . Famotidine Rash  . Latex Rash  . Omeprazole Nausea Only    Cough, shortness of breath - patient doesn't remember  . Peanut-Containing Drug Products Itching and Rash  . Protonix [Pantoprazole] Rash  . Wellbutrin [Bupropion] Palpitations    The results of significant diagnostics from this hospitalization (including imaging, microbiology, ancillary and laboratory) are listed below for reference.    Significant Diagnostic  Studies: DG Chest Port 1 View  Result Date: 07/30/2020 CLINICAL DATA:  Shortness of breath for 4 days. EXAM: PORTABLE CHEST 1 VIEW COMPARISON:  06/12/2020 FINDINGS: Pacer/AICD device. Midline trachea. Borderline cardiomegaly. Small right pleural effusion, increased. Trace left pleural fluid, new. No pneumothorax. Pulmonary interstitial thickening is increased. Right greater than left base airspace disease. Nodular density projecting over the right upper lobe laterally has been present back to 02/10/2017 and is likely due to a sclerotic lesion within the right scapula. Example of 1.8 cm. IMPRESSION: Cardiomegaly with interstitial thickening, suspicious for congestive heart failure. Increased right and new small left pleural effusions. Bibasilar airspace disease, most likely atelectasis. Especially at the right lung base, pneumonia cannot be excluded. Electronically Signed   By: Abigail Miyamoto M.D.   On: 07/30/2020 17:34   ECHOCARDIOGRAM COMPLETE  Result Date: 08/01/2020    ECHOCARDIOGRAM REPORT   Patient Name:   TACORA ATHANAS Casas Date of Exam: 08/01/2020 Medical Rec #:  [id number]      Height:       64.0 in Accession #:    [id number]     Weight:       129.9 lb Date of Birth:  February 20, 1951      BSA:          1.628 m Patient Age:    70 years       BP:           133/63 mmHg Patient Gender: F              HR:           74 bpm. Exam Location:  Inpatient Procedure: 2D Echo, Cardiac Doppler, Color Doppler and  Intracardiac            Opacification Agent Indications:    Dyspnea R06.00  History:        Patient has prior history of Echocardiogram examinations, most                 recent 04/14/2020. CHF, CAD, Defibrillator, Arrythmias:Atrial                 Fibrillation, Signs/Symptoms:Alzheimer's and Hypertensive Heart                 Disease; Risk Factors:Dyslipidemia and Former Smoker. History of                 cardiac arrest. Chronic kidney disease.  Sonographer:    Darlina Sicilian RDCS Referring Phys: [id number] [place]  1. Left ventricular ejection fraction, by estimation, is 25 to 30%. The left ventricle has severely decreased function. The left ventricle demonstrates global hypokinesis. There is moderate asymmetric left ventricular hypertrophy of the basal-septal segment. Left ventricular diastolic parameters are consistent with Grade I diastolic dysfunction (impaired relaxation).  2. Right ventricular systolic function is normal. The right ventricular size is normal. There is normal pulmonary artery systolic pressure. The estimated right ventricular systolic pressure is 30.8 mmHg.  3. The mitral valve is degenerative. Mild mitral valve regurgitation.  4. The aortic valve was not well visualized. Aortic valve regurgitation is not visualized. No aortic stenosis is present.  5. The inferior vena cava is normal in size with greater than 50% respiratory variability, suggesting right atrial pressure of 3 mmHg. FINDINGS  Left Ventricle: Left ventricular ejection fraction, by estimation, is 25 to 30%. The left ventricle has severely decreased function. The left ventricle demonstrates global hypokinesis. Definity contrast agent was given IV to delineate the  left ventricular endocardial borders. The left ventricular internal cavity size was normal in size. There is moderate asymmetric left ventricular hypertrophy of the basal-septal segment. Left ventricular diastolic parameters are consistent with  Grade I diastolic dysfunction (impaired relaxation). Right Ventricle: The right ventricular size is normal. No increase in right ventricular wall thickness. Right ventricular systolic function is normal. There is normal pulmonary artery systolic pressure. The tricuspid regurgitant velocity is 2.47 m/s, and  with an assumed right atrial pressure of 3 mmHg, the estimated right ventricular systolic pressure is 72.0 mmHg. Left Atrium: Left atrial size was normal in size. Right Atrium: Right atrial size was normal in size. Pericardium: Trivial pericardial effusion is present. Mitral Valve: The mitral valve is degenerative in appearance. Mild to moderate mitral annular calcification. Mild mitral valve regurgitation. Tricuspid Valve: The tricuspid valve is normal in structure. Tricuspid valve regurgitation is trivial. Aortic Valve: The aortic valve was not well visualized. Aortic valve regurgitation is not visualized. No aortic stenosis is present. Pulmonic Valve: The pulmonic valve was not well visualized. Pulmonic valve regurgitation is not visualized. Aorta: The aortic root and ascending aorta are structurally normal, with no evidence of dilitation. Venous: The inferior vena cava is normal in size with greater than 50% respiratory variability, suggesting right atrial pressure of 3 mmHg. IAS/Shunts: The interatrial septum was not well visualized.  LEFT VENTRICLE PLAX 2D LVIDd:         4.80 cm      Diastology LVIDs:         4.20 cm      LV e' medial:   3.50 cm/s LV PW:         0.95 cm      LV E/e' medial: 12.6 LV IVS:        1.40 cm      LV e' lateral:  5.43 cm/s LVOT diam:     1.80 cm LV SV:         33 LV SV Index:   20 LVOT Area:     2.54 cm  LV Volumes (MOD) LV vol d, MOD A2C: 275.0 ml LV vol d, MOD A4C: 258.0 ml LV vol s, MOD A2C: 197.0 ml LV vol s, MOD A4C: 184.0 ml LV SV MOD A2C:     78.0 ml LV SV MOD A4C:     258.0 ml LV SV MOD BP:      84.7 ml RIGHT VENTRICLE RV S prime:     8.68 cm/s TAPSE (M-mode): 1.8 cm LEFT  ATRIUM             Index       RIGHT ATRIUM           Index LA diam:        3.10 cm 1.90 cm/m  RA Area:     15.00 cm LA Vol (A2C):   47.3 ml 29.05 ml/m RA Volume:   43.20 ml  26.53 ml/m LA Vol (A4C):   39.5 ml 24.26 ml/m LA Biplane Vol: 44.6 ml 27.39 ml/m  AORTIC VALVE LVOT Vmax:   83.30 cm/s LVOT Vmean:  52.900 cm/s LVOT VTI:    0.130 m  AORTA Ao Root diam: 3.20 cm MITRAL VALVE               RICUSPID VALVE MV Area (PHT): 2.17 cm    TR Peak grad:  24.4 mmHg MV Decel Time: 349 msec    TR Vmax:       247.00 cm/s MV  E velocity: 44.10 cm/s MV A velocity:             SHUNTS                            Systemic VTI:  0.13 m                            Systemic Diam: 1.80 cm Oswaldo Milian MD Electronically signed by Oswaldo Milian MD Signature Date/Time: 08/01/2020/10:54:34 AM    Final    DG ESOPHAGUS W SINGLE CM (SOL OR THIN BA)  Result Date: 07/16/2020 CLINICAL DATA:  Dysphagia, globus sensation were since previous stroke and heart attack. EXAM: ESOPHOGRAM/BARIUM SWALLOW TECHNIQUE: Single contrast examination was performed using 3 minutes 54 seconds. FLUOROSCOPY TIME:  Fluoroscopy Time:  15.1 Radiation Exposure Index (if provided by the fluoroscopic device): 15.1 mGy Number of Acquired Spot Images: 3 COMPARISON:  None aside from swallowing evaluation from February of 2021. FINDINGS: Single contrast was performed due to previous episodes of aspiration detected on a swallow study performed in February 2021. This limits mucosal detail. Lateral projection performed for gross swallow assessment showing some penetration but no signs of aspiration. Esophagus showing normal distensibility but significant stasis of the esophagus in the proximal to mid esophagus even while standing. Prone RAO positioning with single swallow assessment did show an intact primary wave for the first 2 swallows. Attempted full column assessment was limited as there was significant stasis in the proximal esophagus after the first  swallow. Small hiatal hernia was demonstrated in this position with no gross signs of stricture distally. Attempts at swallowing a barium tablet under observation were unsuccessful. IMPRESSION: 1. Given history of prior aspiration barium esophagram performed only with thin barium. 2. Esophageal dysmotility with significant stasis in the mid esophagus and proximal esophagus even while standing. No gross stricture or lesion. 3. Small hiatal hernia. 4. Follow-up dedicated swallowing evaluation may be of benefit to determine whether there is persistent swallow dysfunction. These results will be called to the ordering clinician or representative by the Radiologist Assistant, and communication documented in the PACS or [employer]. Electronically Signed   By: Zetta Bills M.D.   On: 07/16/2020 11:20    Microbiology: Recent Results (from the past 240 hour(s))  SARS CORONAVIRUS 2 (TAT 6-24 HRS) Nasopharyngeal Nasopharyngeal Swab     Status: None   Collection Time: 07/30/20  5:03 PM   Specimen: Nasopharyngeal Swab  Result Value Ref Range Status   SARS Coronavirus 2 NEGATIVE NEGATIVE Final    Comment: (NOTE) SARS-CoV-2 target nucleic acids are NOT DETECTED.  The SARS-CoV-2 RNA is generally detectable in upper and lower respiratory specimens during the acute phase of infection. Negative results do not preclude SARS-CoV-2 infection, do not rule out co-infections with other pathogens, and should not be used as the sole basis for treatment or other patient management decisions. Negative results must be combined with clinical observations, patient history, and epidemiological information. The expected result is Negative.  Fact Sheet for Patients: SugarRoll.be  Fact Sheet for Healthcare Providers: https://www.woods-mathews.com/  This test is not yet approved or cleared by the [place] FDA and  has been authorized for detection and/or diagnosis of  SARS-CoV-2 by FDA under an Emergency Use Authorization (EUA). This EUA will remain  in effect (meaning this test can be used) for the duration of the COVID-19 declaration under Se ction 564(b)(1) of the  Act, 21 U.S.C. section 360bbb-3(b)(1), unless the authorization is terminated or revoked sooner.  Performed at [facility], [place] [address] [place], [place] [address]      Labs: Basic Metabolic Panel: Recent Labs  Lab 07/30/20 1704 07/31/20 0500 08/01/20 0217 08/02/20 0128 08/03/20 0126  NA 137 140 140 139 138  K 4.1 3.9 3.6 3.5 3.3*  CL 100 102 99 96* 94*  CO2 26 28 29 28  32  GLUCOSE 136* 88 94 93 95  BUN 18 18 24* 32* 35*  CREATININE 2.90* 2.89* 2.88* 3.19* 3.25*  CALCIUM 9.1 8.9 9.0 9.3 9.2  MG 2.5*  --   --   --   --    Liver Function Tests: Recent Labs  Lab 07/30/20 1704  AST 25  ALT 11  ALKPHOS 64  BILITOT 1.1  PROT 7.2  ALBUMIN 4.2   No results for input(s): LIPASE, AMYLASE in the last 168 hours. No results for input(s): AMMONIA in the last 168 hours. CBC: Recent Labs  Lab 07/30/20 1704  WBC 6.0  NEUTROABS 4.1  HGB 11.2*  HCT 34.5*  MCV 91.8  PLT 196   Cardiac Enzymes: No results for input(s): CKTOTAL, CKMB, CKMBINDEX, TROPONINI in the last 168 hours. BNP: BNP (last 3 results) Recent Labs    04/14/20 0524 06/12/20 0600 07/30/20 1704  BNP 2,779.0* 1,301.8* 1,856.0*    ProBNP (last 3 results) Recent Labs    10/20/19 1208 11/10/19 1507 11/17/19 1031  PROBNP 3,188* 18,452* 13,138*    CBG: Recent Labs  Lab 08/01/20 0805 08/01/20 1200 08/02/20 0837  GLUCAP 90 100* 99    Principal Problem:   Acute respiratory failure with hypoxemia (HCC) Active Problems:   Essential hypertension   HLD (hyperlipidemia)   GERD (gastroesophageal reflux disease)   Acute on chronic combined systolic and diastolic congestive heart failure (HCC)   CKD (chronic kidney disease) stage 4, GFR 15-29 ml/min (HCC)   Time coordinating discharge: 38  minutes.  Signed:        Graceland Wachter, DO [facility]  08/03/2020, 4:15 PM

## 2020-08-06 ENCOUNTER — Other Ambulatory Visit: Payer: Medicare HMO

## 2020-08-06 ENCOUNTER — Telehealth: Payer: Self-pay

## 2020-08-06 DIAGNOSIS — K580 Irritable bowel syndrome with diarrhea: Secondary | ICD-10-CM

## 2020-08-06 NOTE — Telephone Encounter (Signed)
Transition Care Management Follow-up Telephone Call  Date of discharge and from where: 08/03/2020[facility]  How have you been since you were released from the hospital? Patient is weak but doing much better since getting home.   Any questions or concerns? No  Items Reviewed:  Did the pt receive and understand the discharge instructions provided? Yes   Medications obtained and verified? Yes   Other? No   Any new allergies since your discharge? No   Dietary orders reviewed? Yes  Do you have support at home? Yes   Home Care and Equipment/Supplies: Were home health services ordered? not applicable If so, what is the name of the agency? N/A  Has the agency set up a time to come to the patient's home? not applicable Were any new equipment or medical supplies ordered?  No What is the name of the medical supply agency? N/A Were you able to get the supplies/equipment? not applicable Do you have any questions related to the use of the equipment or supplies? No  Functional Questionnaire: (I = Independent and D = Dependent) ADLs: I  Bathing/Dressing- I  Meal Prep- I  Eating- I  Maintaining continence- I  Transferring/Ambulation- I  Managing Meds- I  Follow up appointments reviewed:   PCP Hospital f/u appt confirmed? Yes  Scheduled to see Dr. Diona Browner on 08/16/2020 @ 2 pm.  [facility] f/u appt confirmed? follow up with cardiology   Are transportation arrangements needed? No   If their condition worsens, is the pt aware to call PCP or go to the Emergency Dept.? Yes  Was the patient provided with contact information for the PCP's office or ED? Yes  Was to pt encouraged to call back with questions or concerns? Yes

## 2020-08-07 ENCOUNTER — Other Ambulatory Visit: Payer: Medicare HMO

## 2020-08-07 DIAGNOSIS — A0471 Enterocolitis due to Clostridium difficile, recurrent: Secondary | ICD-10-CM | POA: Diagnosis not present

## 2020-08-07 DIAGNOSIS — K580 Irritable bowel syndrome with diarrhea: Secondary | ICD-10-CM

## 2020-08-07 DIAGNOSIS — A0472 Enterocolitis due to Clostridium difficile, not specified as recurrent: Secondary | ICD-10-CM | POA: Diagnosis not present

## 2020-08-10 ENCOUNTER — Telehealth: Payer: Self-pay | Admitting: Gastroenterology

## 2020-08-10 ENCOUNTER — Other Ambulatory Visit: Payer: Self-pay

## 2020-08-10 ENCOUNTER — Encounter: Payer: Self-pay | Admitting: Gastroenterology

## 2020-08-10 ENCOUNTER — Ambulatory Visit: Payer: Medicare HMO | Admitting: Gastroenterology

## 2020-08-10 VITALS — BP 122/60 | HR 69 | Ht 64.0 in | Wt 128.2 lb

## 2020-08-10 DIAGNOSIS — R1310 Dysphagia, unspecified: Secondary | ICD-10-CM | POA: Diagnosis not present

## 2020-08-10 DIAGNOSIS — K580 Irritable bowel syndrome with diarrhea: Secondary | ICD-10-CM

## 2020-08-10 DIAGNOSIS — A498 Other bacterial infections of unspecified site: Secondary | ICD-10-CM

## 2020-08-10 LAB — GI PROFILE, STOOL, PCR

## 2020-08-10 MED ORDER — VANCOMYCIN HCL 125 MG PO CAPS: ORAL_CAPSULE | ORAL | 0 refills | Status: AC

## 2020-08-10 NOTE — Telephone Encounter (Signed)
[name], daughter and informed results.  Please send Rx for Dificid 200mg  BID 10 days followed by 200 mg  daily X 10 days and 200mg  every other day X 10 days given its recurrent infection.  Thanks

## 2020-08-10 NOTE — Patient Instructions (Signed)
Eat small frequent meals.  Remain on a high protein diet and a low sodium diet.   Follow up as needed.

## 2020-08-10 NOTE — Telephone Encounter (Signed)
Dr. Silverio Decamp, prescription has been flagged due to allergy to Clarithromycin, spoke with patient she reports that she had leg and ankle swelling which was painful for her. Please advise, thank you.

## 2020-08-10 NOTE — Progress Notes (Signed)
[name]    [id number]    09-21-1950  Primary Care Physician:Bedsole, Mervyn Gay, MD  Referring Physician: Jinny Sanders, MD [address] [place],  [place] [address]   Chief complaint: Dysphagia, epigastric fullness and IBS- diarrhea HPI:  70 year old very pleasant female here for follow-up visit accompanied by her daughter  Recently discharged from hospital on February 18 after hospitalization for acute decompensated congestive heart failure and acute hypoxic respiratory failure  She is doing somewhat better after her heart medications were optimized.  She is eating better.  Denies any vomiting.  She continues to have intermittent choking sensation but is able to eat soft foods. Denies any rectal bleeding or melena.  Barium esophagram July 16, 2020: 1. Given history of prior aspiration barium esophagram performed only with thin barium. 2. Esophageal dysmotility with significant stasis in the mid esophagus and proximal esophagus even while standing. No gross stricture or lesion. 3. Small hiatal hernia. 4. Follow-up dedicated swallowing evaluation may be of benefit to determine whether there is persistent swallow dysfunction.   Outpatient Encounter Medications as of 08/10/2020  Medication Sig  . acetaminophen (TYLENOL) 500 MG tablet Take 1,000 mg by mouth 2 (two) times daily as needed (pain).  [name] albuterol (VENTOLIN HFA) 108 (90 Base) MCG/ACT inhaler Inhale 2 puffs into the lungs every 2 (two) hours as needed for wheezing or shortness of breath.  [name] apixaban (ELIQUIS) 2.5 MG TABS tablet Take 1 tablet (2.5 mg total) by mouth 2 (two) times daily.  [name] aspirin 81 MG chewable tablet Chew 1 tablet (81 mg total) by mouth daily.  . carvedilol (COREG) 3.125 MG tablet Take 1 tablet (3.125 mg total) by mouth 2 (two) times daily with a meal.  . Cholecalciferol (VITAMIN D3 PO) Take 1,000 Units by mouth in the morning and at bedtime.  [name] ezetimibe (ZETIA) 10 MG tablet  TAKE 1 TABLET BY MOUTH EVERY DAY  . furosemide (LASIX) 20 MG tablet Take 1 tablet (20 mg total) by mouth 2 (two) times daily.  . Magnesium 400 MG TABS Take 400 mg by mouth 2 (two) times daily.   . ondansetron (ZOFRAN ODT) 4 MG disintegrating tablet Take 1 tablet (4 mg total) by mouth every 8 (eight) hours as needed for nausea or vomiting.  [name] PEPCID 20 MG tablet Take 1 tablet (20 mg total) by mouth 2 (two) times daily.  . polyethylene glycol (MIRALAX / GLYCOLAX) 17 g packet Take 17 g by mouth daily.  . Potassium Chloride ER 20 MEQ TBCR Take 20 mEq by mouth daily.  . sertraline (ZOLOFT) 50 MG tablet Take 50 mg by mouth at bedtime.  . nitroGLYCERIN (NITROSTAT) 0.4 MG SL tablet Place 1 tablet (0.4 mg total) under the tongue every 5 (five) minutes as needed for chest pain.  . rosuvastatin (CRESTOR) 10 MG tablet TAKE 1 TABLET (10 MG TOTAL) BY MOUTH DAILY AT 6 PM.   No facility-administered encounter medications on file as of 08/10/2020.    Allergies as of 08/10/2020 - Review Complete 08/10/2020  Allergen Reaction Noted  . Shellfish-derived products Anaphylaxis 11/19/2011  . Ativan [lorazepam] Other (See Comments) 08/14/2015  . Buspar [buspirone] Nausea Only 12/09/2011  . Clarithromycin Swelling 03/17/2013  . Codeine Nausea And Vomiting 09/07/2006  . Doxycycline Other (See Comments) 09/07/2006  . Guaifenesin Other (See Comments) 09/07/2006  . Oxycodone Nausea And Vomiting 02/09/2017  . Prednisone Nausea And Vomiting and Other (See Comments) 09/07/2006  .  Statins Other (See Comments) 07/04/2015  . Sulfonamide derivatives Nausea And Vomiting 09/07/2006  . Tetracycline Nausea Only 09/07/2006  . Vicodin [hydrocodone-acetaminophen] Nausea And Vomiting 02/17/2017  . Famotidine Rash 09/07/2006  . Latex Rash   . Omeprazole Nausea Only 04/11/2015  . Peanut-containing drug products Itching and Rash 02/09/2017  . Protonix [pantoprazole] Rash 09/08/2019  . Wellbutrin [bupropion] Palpitations 09/21/2017     Past Medical History:  Diagnosis Date  . Allergy   . Anxiety   . Arnold-Chiari malformation ([place])   . Arthritis   . CHF (congestive heart failure) ([place])   . Chronic systolic heart failure ([place]) 11/03/2019  . Coronary artery disease   . Dyspnea   . Encounter for assessment of implantable cardioverter-defibrillator (ICD) 11/10/2019  . GERD (gastroesophageal reflux disease)   . H. pylori infection    3 years ago  . H/O cardiac arrest    Hx of VT/Vfib arrest  . Hyperlipidemia   . ICD BiV Medtronic model [id number] [place] Quad CRT-D SureScan ICD 08/25/2019   . Incontinence   . Paroxysmal atrial fibrillation ([facility]) 08/06/2019  . Pulmonary edema 2021  . Syncope and collapse    Per pt, denies passing out  . Tobacco use disorder   . Unspecified essential hypertension     Past Surgical History:  Procedure Laterality Date  . APPENDECTOMY    . ARNOLD CHIARI SURGERY     neurocranial surgery  . BIV ICD INSERTION CRT-D N/A 08/25/2019   Procedure: BIV ICD INSERTION CRT-D;  Surgeon: Constance Haw, MD;  Location: [facility];  Service: Cardiovascular;  Laterality: N/A;  . BLEPHAROPLASTY     Bil  . C-EYE SURGERY PROCEDURE    . CARDIAC CATHETERIZATION    . CHOLECYSTECTOMY    . EYE SURGERY    . heart failure    . LEFT HEART CATH AND CORONARY ANGIOGRAPHY N/A 08/04/2019   Procedure: LEFT HEART CATH AND CORONARY ANGIOGRAPHY;  Surgeon: Adrian Prows, MD;  Location: [facility];  Service: Cardiovascular;  Laterality: N/A;  . MASS EXCISION Right 12/27/2013   Procedure: MINOR EXCISION OF RIGHT THUMB MUCOID CYST, DEBRIDEMENT OF INTERPHALANGEAL JOINT;  Surgeon: Cammie Sickle, MD;  Location: [facility];  Service: Orthopedics;  Laterality: Right;  . mass on thumb  right  . TOTAL KNEE ARTHROPLASTY Right 02/17/2017  . TOTAL KNEE ARTHROPLASTY Right 02/17/2017   Procedure: TOTAL KNEE ARTHROPLASTY;  Surgeon: Melrose Nakayama, MD;  Location: [facility];  Service: Orthopedics;   Laterality: Right;  . TUBAL LIGATION      Family History  Problem Relation Age of Onset  . Bone cancer Mother   . Heart disease Mother   . Cancer Sister        metastatic; unknown primary  . Diverticulosis Sister   . Tuberculosis Father   . Hypertension Sister   . Colon cancer Neg Hx   . Esophageal cancer Neg Hx   . Pancreatic cancer Neg Hx   . Stomach cancer Neg Hx   . Liver disease Neg Hx   . Kidney disease Neg Hx   . Rectal cancer Neg Hx   . Pancreatic disease Neg Hx     Social History   Socioeconomic History  . Marital status: Married    Spouse name: Not on file  . Number of children: 2  . Years of education: Not on file  . Highest education level: Not on file  Occupational History  . Occupation: Emergency planning/management officer  Tobacco  Use  . Smoking status: Former Smoker    Packs/day: 1.00    Years: 50.00    Pack years: 50.00    Types: Cigarettes    Quit date: 08/08/2019    Years since quitting: 1.0  . Smokeless tobacco: Never Used  Vaping Use  . Vaping Use: Never used  Substance and Sexual Activity  . Alcohol use: No    Alcohol/week: 0.0 standard drinks  . Drug use: No  . Sexual activity: Not on file  Other Topics Concern  . Not on file  Social History Narrative  . Not on file   Social Determinants of Health   Financial Resource Strain: Not on file  Food Insecurity: Not on file  Transportation Needs: Not on file  Physical Activity: Not on file  Stress: Not on file  Social Connections: Not on file  Intimate Partner Violence: Not on file      Review of systems: All other review of systems negative except as mentioned in the HPI.   Physical Exam: Vitals:   08/10/20 1029  BP: 122/60  Pulse: 69   Body mass index is 22.01 kg/m. Gen:      No acute distress HEENT:  sclera anicteric Abd:      soft, non-tender; no palpable masses, no distension Ext:    No edema Neuro: alert and oriented x 3 Psych: normal mood and affect  Data Reviewed:  Reviewed labs,  radiology imaging, old records and pertinent past GI work up   Assessment and Plan/Recommendations:  70 year old very pleasant female with multiple comorbidities, recent C. difficile colitis, GERD with complaints of worsening reflux symptoms with regurgitation, decreased appetite and loss of taste She is on chronic anticoagulation with Eliquis.   Recently hospitalized with worsening heart failure and hypoxic respiratory failure  She is recovering, feeling slightly better.  She no longer has epigastric discomfort or fullness She wants to hold off any invasive endoscopic procedures at this time.  Dyspepsia symptoms were likely secondary to congestive heart failure.  Continue famotidine twice daily and antireflux measures Small frequent meals with high-protein diet Continue with low-sodium diet  IBS diarrhea: Use Benefiber 1 tablespoon 3 times daily with meals to improve stool consistency and diarrhea  Return as needed  This visit required40 minutes of patient care (this includes precharting, chart review, review of results, face-to-face time used for counseling as well as treatment plan and follow-up. The patient was provided an opportunity to ask questions and all were answered. The patient agreed with the plan and demonstrated an understanding of the instructions.  Damaris Hippo , MD    CC: Jinny Sanders, MD

## 2020-08-10 NOTE — Telephone Encounter (Signed)
Spoke with Butch Penny at [facility], [place]. Difficile Toxin A/B detected.  Dr. Silverio Decamp, please advise, thank you.

## 2020-08-10 NOTE — Telephone Encounter (Signed)
Spoke with [name] in regards to medication change due to possible reaction with Dificid. Christy confirmed pharmacy on file, verbalized understanding, and had no concerns at the end of the call.

## 2020-08-10 NOTE — Telephone Encounter (Signed)
According to her daughter patient was struggling to get the 4 doses of vancomycin but given she has potential reaction will have to  go with vancomycin 125 mg oral 4 times daily for 14 days followed by 125 mg twice daily for 5 days, 125 mg daily for 5 days and then every other day for 5 days for recurrent C. difficile.  Please send the new prescription.  Thank you

## 2020-08-10 NOTE — Telephone Encounter (Signed)
Inbound call from [facility] stating C difficile toxin A/B was detected for patient.  Will be faxing over results as well.

## 2020-08-16 ENCOUNTER — Encounter: Payer: Self-pay | Admitting: Family Medicine

## 2020-08-16 ENCOUNTER — Ambulatory Visit (INDEPENDENT_AMBULATORY_CARE_PROVIDER_SITE_OTHER): Payer: Medicare HMO | Admitting: Family Medicine

## 2020-08-16 ENCOUNTER — Other Ambulatory Visit: Payer: Self-pay

## 2020-08-16 VITALS — BP 100/72 | HR 78 | Temp 98.3°F | Ht 64.0 in | Wt 127.2 lb

## 2020-08-16 DIAGNOSIS — A0472 Enterocolitis due to Clostridium difficile, not specified as recurrent: Secondary | ICD-10-CM | POA: Diagnosis not present

## 2020-08-16 DIAGNOSIS — I5043 Acute on chronic combined systolic (congestive) and diastolic (congestive) heart failure: Secondary | ICD-10-CM | POA: Diagnosis not present

## 2020-08-16 DIAGNOSIS — I5023 Acute on chronic systolic (congestive) heart failure: Secondary | ICD-10-CM | POA: Diagnosis not present

## 2020-08-16 DIAGNOSIS — R0602 Shortness of breath: Secondary | ICD-10-CM

## 2020-08-16 DIAGNOSIS — I10 Essential (primary) hypertension: Secondary | ICD-10-CM | POA: Diagnosis not present

## 2020-08-16 DIAGNOSIS — N1832 Chronic kidney disease, stage 3b: Secondary | ICD-10-CM | POA: Diagnosis not present

## 2020-08-16 NOTE — Assessment & Plan Note (Signed)
BP running low on furosemide despite lower dose of coreg. Pt has  Dizziness chronically but this is improved somewhat since hospitalization. Re-eval BMET on diuretic.

## 2020-08-16 NOTE — Assessment & Plan Note (Signed)
Today in office lung exam clear and euvolemic.

## 2020-08-16 NOTE — Assessment & Plan Note (Signed)
Improved following diuresis.. Now on Lasix 20 mg twice daily.

## 2020-08-16 NOTE — Patient Instructions (Signed)
Please stop at the lab to have labs drawn.  

## 2020-08-16 NOTE — Progress Notes (Signed)
Patient ID: [name], female    DOB: Jul 08, 1950, 70 y.o.   MRN: [id number]  This visit was conducted in person.  BP 100/72   Pulse 78   Temp 98.3 F (36.8 C) (Temporal)   Ht 5\' 4"  (1.626 m)   Wt 127 lb 4 oz (57.7 kg)   SpO2 97%   BMI 21.84 kg/m    CC:  Chief Complaint  Patient presents with  . Hospitalization Follow-up    Subjective:   HPI: [name] is a 70 y.o. female presenting on 08/16/2020 for Hospitalization Follow-up  Admitted 07/30/2020\ Discharged 08/03/2020  Notes reviewed in detail.. summary copied below for information.  Recommendations for Outpatient Follow-up 1. Follow up with PCP in 7-10 days. Have chemistry checked on that visit and reported to PCP. 2. Follow up with cardiology in 2-4 weeks.    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 No lower extremity edema. On her initial physical examination blood pressure 150/83, temperature 98.2, heart rate 71, respiratory rate 18, oxygen saturation 90% on room air, lungs have decreased air entry bilaterally with bilateral rales, heart S1-S2, irregularly irregular, abdomen soft, no lower extremity edema.  Sodium 137, potassium 4.1, chloride 100, bicarb 26, glucose 136, BUN 18, creatinine 2.90, BNP 1856, troponin I 28, white count 6.0, hemoglobin 0.2, hematocrit 34.5, platelets 196. SARS COVID-19 negative.  Chest radiograph with cardiomegaly, bilateral interstitial infiltrates predominantly at bases with small bilateral pleural effusions.  EKG 92 bpm, left axis deviation, atrial pacing, ventricular pacing, prolonged QRS,no ST segment or T wave changes. Relevant past medical, surgical, family and  social history reviewed and updated as indicated. Interim medical history since our last visit reviewed.  Echocardiogram(08/01/2020): It has demonstrated EF of 25-30% . The LV has severely decreased function and global hypokinesis. There is Grade 1 diastolic dysfunction.  The patient has responded well to diuresis. Due to hypotension, her coreg was reduced to 3.125 bid. No ACE I was given due to elevated creatinine. The patient's respiratory status has returned to baseline (95% on room air). She is 1756 cc negative in terms of fluid balance.   08/16/20   Today she reports DOE is improved. No swelling in ankle, no CP.  She is using lasix 20 mg 2 times daily.   On vancomycin for cdiff... she has been on for 4 days so far. She has noted slight improvement in diarrhea.  No fever. Less nausea with zofran  Good po intake, improved appetite.  No new dizziness... improved some on lower dose coreg. BP Readings from Last 3 Encounters:  08/16/20 100/72  08/10/20 122/60  08/03/20 (!) 111/55   Wt Readings from Last 3 Encounters:  08/16/20 127 lb 4 oz (57.7 kg)  08/10/20 128 lb 3.2 oz (58.2 kg)  08/03/20 126 lb 1.7 oz (57.2 kg)    Allergies and medications reviewed and updated. Outpatient Medications Prior to Visit  Medication Sig Dispense Refill  . acetaminophen (TYLENOL) 500 MG tablet Take 1,000 mg by mouth 2 (two) times daily as needed (pain).    [name] albuterol (VENTOLIN HFA) 108 (90 Base) MCG/ACT inhaler Inhale 2 puffs into the lungs every 2 (two) hours as needed for wheezing or shortness of breath. 1 each 0  . apixaban (ELIQUIS) 2.5 MG  TABS tablet Take 1 tablet (2.5 mg total) by mouth 2 (two) times daily. 60 tablet 0  . aspirin 81 MG chewable tablet Chew 1 tablet (81 mg total) by mouth daily. 30 tablet 0  . carvedilol (COREG) 3.125 MG tablet Take 1 tablet (3.125 mg total) by mouth 2 (two) times daily with a meal. 60 tablet 0  . Cholecalciferol (VITAMIN D3 PO) Take 1,000 Units by mouth in the  morning and at bedtime.    [name] ezetimibe (ZETIA) 10 MG tablet TAKE 1 TABLET BY MOUTH EVERY DAY 90 tablet 1  . furosemide (LASIX) 20 MG tablet Take 1 tablet (20 mg total) by mouth 2 (two) times daily. 60 tablet 0  . Magnesium 400 MG TABS Take 400 mg by mouth 2 (two) times daily.     . ondansetron (ZOFRAN ODT) 4 MG disintegrating tablet Take 1 tablet (4 mg total) by mouth every 8 (eight) hours as needed for nausea or vomiting. 20 tablet 1  . PEPCID 20 MG tablet Take 1 tablet (20 mg total) by mouth 2 (two) times daily. 60 tablet 3  . polyethylene glycol (MIRALAX / GLYCOLAX) 17 g packet Take 17 g by mouth daily. 14 each 0  . Potassium Chloride ER 20 MEQ TBCR Take 20 mEq by mouth daily. 30 tablet 0  . sertraline (ZOLOFT) 50 MG tablet Take 50 mg by mouth at bedtime.    . vancomycin (VANCOCIN) 125 MG capsule Take 1 capsule (125 mg total) by mouth 4 (four) times daily for 14 days, THEN 1 capsule (125 mg total) in the morning and at bedtime for 5 days, THEN 1 capsule (125 mg total) daily for 5 days, THEN 1 capsule (125 mg total) every other day for 5 days. 74 capsule 0  . nitroGLYCERIN (NITROSTAT) 0.4 MG SL tablet Place 1 tablet (0.4 mg total) under the tongue every 5 (five) minutes as needed for chest pain. 30 tablet 3  . rosuvastatin (CRESTOR) 10 MG tablet TAKE 1 TABLET (10 MG TOTAL) BY MOUTH DAILY AT 6 PM. 90 tablet 1   No facility-administered medications prior to visit.     Per HPI unless specifically indicated in ROS section below Review of Systems  Constitutional: Positive for fatigue. Negative for fever.  HENT: Negative for congestion.   Eyes: Negative for pain.  Respiratory: Negative for cough and shortness of breath.   Cardiovascular: Negative for chest pain, palpitations and leg swelling.  Gastrointestinal: Positive for diarrhea. Negative for abdominal pain and nausea.  Genitourinary: Negative for dysuria and vaginal bleeding.  Musculoskeletal: Negative for back pain.  Neurological:  Negative for syncope, light-headedness and headaches.  Psychiatric/Behavioral: Negative for dysphoric mood.   Objective:  BP 100/72   Pulse 78   Temp 98.3 F (36.8 C) (Temporal)   Ht 5\' 4"  (1.626 m)   Wt 127 lb 4 oz (57.7 kg)   SpO2 97%   BMI 21.84 kg/m   Wt Readings from Last 3 Encounters:  08/16/20 127 lb 4 oz (57.7 kg)  08/10/20 128 lb 3.2 oz (58.2 kg)  08/03/20 126 lb 1.7 oz (57.2 kg)   Body mass index is 21.84 kg/m.    Physical Exam Constitutional:      General: She is not in acute distress.Vital signs are normal.     Appearance: Normal appearance. She is well-developed and well-nourished. She is cachectic. She is not ill-appearing or toxic-appearing.  HENT:     Head: Normocephalic.     Right Ear: Hearing, tympanic membrane, ear  canal and external ear normal. Tympanic membrane is not erythematous, retracted or bulging.     Left Ear: Hearing, tympanic membrane, ear canal and external ear normal. Tympanic membrane is not erythematous, retracted or bulging.     Nose: No mucosal edema or rhinorrhea.     Right Sinus: No maxillary sinus tenderness or frontal sinus tenderness.     Left Sinus: No maxillary sinus tenderness or frontal sinus tenderness.     Mouth/Throat:     Mouth: Oropharynx is clear and moist and mucous membranes are normal.     Pharynx: Uvula midline.  Eyes:     General: Lids are normal. Lids are everted, no foreign bodies appreciated.     Extraocular Movements: EOM normal.     Conjunctiva/sclera: Conjunctivae normal.     Pupils: Pupils are equal, round, and reactive to light.  Neck:     Thyroid: No thyroid mass or thyromegaly.     Vascular: No carotid bruit.     Trachea: Trachea normal.  Cardiovascular:     Rate and Rhythm: Normal rate and regular rhythm.     Pulses: Normal pulses and intact distal pulses.     Heart sounds: Normal heart sounds, S1 normal and S2 normal. No murmur heard. No friction rub. No gallop.   Pulmonary:     Effort: Pulmonary  effort is normal. No tachypnea or respiratory distress.     Breath sounds: Normal breath sounds. No decreased breath sounds, wheezing, rhonchi or rales.  Abdominal:     General: Bowel sounds are normal.     Palpations: Abdomen is soft.     Tenderness: There is generalized abdominal tenderness. There is no right CVA tenderness, left CVA tenderness, guarding or rebound.  Musculoskeletal:     Cervical back: Normal range of motion and neck supple.  Skin:    General: Skin is warm, dry and intact.     Findings: No rash.  Neurological:     Mental Status: She is alert.  Psychiatric:        Mood and Affect: Mood is not anxious or depressed.        Speech: Speech normal.        Behavior: Behavior normal. Behavior is cooperative.        Thought Content: Thought content normal.        Cognition and Memory: Cognition and memory normal.        Judgment: Judgment normal.       Results for orders placed or performed in visit on 08/07/20  GI Profile, Stool, PCR  Result Value Ref Range   Campylobacter Not Detected Not Detected   C difficile toxin A/B Detected (A) Not Detected   Plesiomonas shigelloides Not Detected Not Detected   Salmonella Not Detected Not Detected   Vibrio Not Detected Not Detected   Vibrio cholerae Not Detected Not Detected   Yersinia enterocolitica Not Detected Not Detected   Enteroaggregative E coli Not Detected Not Detected   Enteropathogenic E coli Not Detected Not Detected   Enterotoxigenic E coli Not Detected Not Detected   Shiga-toxin-producing E coli Not Detected Not Detected   E coli Z610 Not applicable Not Detected   Shigella/Enteroinvasive E coli Not Detected Not Detected   Cryptosporidium Not Detected Not Detected   Cyclospora cayetanensis Not Detected Not Detected   Entamoeba histolytica Not Detected Not Detected   Giardia lamblia Not Detected Not Detected   Adenovirus F 40/41 Not Detected Not Detected   Astrovirus Not Detected Not Detected  Norovirus  GI/GII Not Detected Not Detected   Rotavirus A Not Detected Not Detected   Sapovirus Not Detected Not Detected    This visit occurred during the SARS-CoV-2 public health emergency.  Safety protocols were in place, including screening questions prior to the visit, additional usage of staff PPE, and extensive cleaning of exam room while observing appropriate contact time as indicated for disinfecting solutions.   COVID 19 screen:  No recent travel or known exposure to COVID19 The patient denies respiratory symptoms of COVID 19 at this time. The importance of social distancing was discussed today.   Assessment and Plan    Problem List Items Addressed This Visit    Acute on chronic combined systolic and diastolic congestive heart failure ([facility])    Today in office lung exam clear and euvolemic.      Acute on chronic HFrEF (heart failure with reduced ejection fraction) ([facility])   Relevant Orders   Basic Metabolic Panel   CKD (chronic kidney disease) stage 3, GFR 30-59 ml/min ([facility])   Relevant Orders   Basic Metabolic Panel   Colitis, Clostridium difficile    Now on vanc per GI. Improving diarrhea.      Essential hypertension    BP running low on furosemide despite lower dose of coreg. Pt has  Dizziness chronically but this is improved somewhat since hospitalization. Re-eval BMET on diuretic.      Shortness of breath - Primary    Improved following diuresis.. Now on Lasix 20 mg twice daily.        Orders Placed This Encounter  Procedures  . Basic Metabolic Panel      [name], MD

## 2020-08-16 NOTE — Assessment & Plan Note (Signed)
Now on vanc per GI. Improving diarrhea.

## 2020-08-17 ENCOUNTER — Telehealth: Payer: Self-pay

## 2020-08-17 ENCOUNTER — Telehealth: Payer: Self-pay | Admitting: Family Medicine

## 2020-08-17 DIAGNOSIS — N179 Acute kidney failure, unspecified: Secondary | ICD-10-CM

## 2020-08-17 DIAGNOSIS — N184 Chronic kidney disease, stage 4 (severe): Secondary | ICD-10-CM

## 2020-08-17 LAB — BASIC METABOLIC PANEL
BUN: 39 mg/dL — ABNORMAL HIGH (ref 6–23)
CO2: 28 mEq/L (ref 19–32)
Calcium: 9.6 mg/dL (ref 8.4–10.5)
Chloride: 100 mEq/L (ref 96–112)
Creatinine, Ser: 3.57 mg/dL — ABNORMAL HIGH (ref 0.40–1.20)
GFR: 12.43 mL/min — CL (ref >60.00)
Glucose, Bld: 84 mg/dL (ref 70–99)
Potassium: 4.9 mEq/L (ref 3.5–5.1)
Sodium: 136 mEq/L (ref 135–145)

## 2020-08-17 NOTE — Telephone Encounter (Signed)
See result note.  

## 2020-08-17 NOTE — Progress Notes (Signed)
Thanks for letting me know.  I believe that she does have a nephrologist.

## 2020-08-17 NOTE — Telephone Encounter (Signed)
-----   Message from Cloyd Stagers, RT sent at 08/17/2020  2:32 PM EST ----- Regarding: Lab Orders for Friday 3.11.2022 Please place lab orders for Friday 3.11.2022, appt notes state "BMET" Thank you, Dyke Maes RT(R)

## 2020-08-17 NOTE — Telephone Encounter (Signed)
[facility] lab called critical results @ 1100  GFR 12.43

## 2020-08-18 ENCOUNTER — Other Ambulatory Visit (HOSPITAL_COMMUNITY)
Admission: RE | Admit: 2020-08-18 | Discharge: 2020-08-18 | Disposition: A | Discharge status: Home / Self Care | Payer: Medicare HMO | Source: Ambulatory Visit | Attending: Internal Medicine | Admitting: Internal Medicine

## 2020-08-18 DIAGNOSIS — Z01812 Encounter for preprocedural laboratory examination: Secondary | ICD-10-CM | POA: Insufficient documentation

## 2020-08-18 DIAGNOSIS — Z20822 Contact with and (suspected) exposure to covid-19: Secondary | ICD-10-CM | POA: Diagnosis not present

## 2020-08-18 LAB — SARS CORONAVIRUS 2 (TAT 6-24 HRS): SARS Coronavirus 2: NEGATIVE

## 2020-08-20 DIAGNOSIS — D1801 Hemangioma of skin and subcutaneous tissue: Secondary | ICD-10-CM | POA: Diagnosis not present

## 2020-08-20 DIAGNOSIS — L84 Corns and callosities: Secondary | ICD-10-CM | POA: Diagnosis not present

## 2020-08-20 DIAGNOSIS — L814 Other melanin hyperpigmentation: Secondary | ICD-10-CM | POA: Diagnosis not present

## 2020-08-20 DIAGNOSIS — L820 Inflamed seborrheic keratosis: Secondary | ICD-10-CM | POA: Diagnosis not present

## 2020-08-21 ENCOUNTER — Ambulatory Visit: Payer: Medicare HMO | Admitting: Internal Medicine

## 2020-08-21 ENCOUNTER — Encounter: Payer: Self-pay | Admitting: Internal Medicine

## 2020-08-21 ENCOUNTER — Other Ambulatory Visit: Payer: Self-pay

## 2020-08-21 ENCOUNTER — Ambulatory Visit (INDEPENDENT_AMBULATORY_CARE_PROVIDER_SITE_OTHER): Payer: Medicare HMO | Admitting: Internal Medicine

## 2020-08-21 VITALS — BP 100/60 | HR 86 | Temp 97.5°F | Ht 65.0 in | Wt 128.4 lb

## 2020-08-21 DIAGNOSIS — I5020 Unspecified systolic (congestive) heart failure: Secondary | ICD-10-CM | POA: Diagnosis not present

## 2020-08-21 DIAGNOSIS — R0602 Shortness of breath: Secondary | ICD-10-CM

## 2020-08-21 DIAGNOSIS — Z87891 Personal history of nicotine dependence: Secondary | ICD-10-CM

## 2020-08-21 DIAGNOSIS — J410 Simple chronic bronchitis: Secondary | ICD-10-CM

## 2020-08-21 LAB — PULMONARY FUNCTION TEST
DL/VA % pred: 85 %
DL/VA: 3.53 ml/min/mmHg/L
DLCO cor % pred: 67 %
DLCO cor: 13.63 ml/min/mmHg
DLCO unc % pred: 62 %
DLCO unc: 12.61 ml/min/mmHg
FEF 25-75 Post: 1.07 L/sec
FEF 25-75 Pre: 1.19 L/sec
FEF2575-%Change-Post: -10 %
FEF2575-%Pred-Post: 54 %
FEF2575-%Pred-Pre: 60 %
FEV1-%Change-Post: -1 %
FEV1-%Pred-Post: 85 %
FEV1-%Pred-Pre: 86 %
FEV1-Post: 2.01 L
FEV1-Pre: 2.04 L
FEV1FVC-%Change-Post: 5 %
FEV1FVC-%Pred-Pre: 89 %
FEV6-%Change-Post: -7 %
FEV6-%Pred-Post: 92 %
FEV6-%Pred-Pre: 99 %
FEV6-Post: 2.77 L
FEV6-Pre: 2.98 L
FEV6FVC-%Change-Post: 0 %
FEV6FVC-%Pred-Post: 103 %
FEV6FVC-%Pred-Pre: 103 %
FVC-%Change-Post: -6 %
FVC-%Pred-Post: 89 %
FVC-%Pred-Pre: 96 %
FVC-Post: 2.8 L
FVC-Pre: 3 L
Post FEV1/FVC ratio: 72 %
Post FEV6/FVC ratio: 99 %
Pre FEV1/FVC ratio: 68 %
Pre FEV6/FVC Ratio: 99 %
RV % pred: 141 %
RV: 3.17 L
TLC % pred: 110 %
TLC: 5.77 L

## 2020-08-21 NOTE — Patient Instructions (Signed)
Come see me as needed, if your breathing changes.

## 2020-08-21 NOTE — Progress Notes (Signed)
PFT done today. 

## 2020-08-21 NOTE — Progress Notes (Signed)
[name]    [id number]    09-24-1950  Primary Care Physician:Bedsole, Mervyn Gay, MD Date of Appointment: 08/21/2020 Established Patient Visit  Chief complaint:   Chief Complaint  Patient presents with  . Follow-up    Get PFT results     HPI: [name] is a 70 y.o. woman with 50 pack year smoking history and CAD.   Interval Updates: Here for follow up today after PFTs. She had an ED and [facility] hospitalization for 5 days for decompensated heart failure. Feels much better after adjusting diuretics. Hasn't needed to use albuterol. Wasn't sure it was helping anyway. Orthopnea is resolved. No le edema or abdominal fullness.  I have reviewed the patient's family social and past medical history and updated as appropriate.   Past Medical History:  Diagnosis Date  . Allergy   . Anxiety   . Arnold-Chiari malformation ([place])   . Arthritis   . CHF (congestive heart failure) ([place])   . Chronic systolic heart failure ([place]) 11/03/2019  . Coronary artery disease   . Dyspnea   . Encounter for assessment of implantable cardioverter-defibrillator (ICD) 11/10/2019  . GERD (gastroesophageal reflux disease)   . H. pylori infection    3 years ago  . H/O cardiac arrest    Hx of VT/Vfib arrest  . Hyperlipidemia   . ICD BiV Medtronic model [id number] [place] Quad CRT-D SureScan ICD 08/25/2019   . Incontinence   . Paroxysmal atrial fibrillation ([place]) 08/06/2019  . Pulmonary edema 2021  . Syncope and collapse    Per pt, denies passing out  . Tobacco use disorder   . Unspecified essential hypertension     Past Surgical History:  Procedure Laterality Date  . APPENDECTOMY    . ARNOLD CHIARI SURGERY     neurocranial surgery  . BIV ICD INSERTION CRT-D N/A 08/25/2019   Procedure: BIV ICD INSERTION CRT-D;  Surgeon: Constance Haw, MD;  Location: [facility];  Service: Cardiovascular;  Laterality: N/A;  . BLEPHAROPLASTY     Bil  . C-EYE SURGERY PROCEDURE    . CARDIAC  CATHETERIZATION    . CHOLECYSTECTOMY    . EYE SURGERY    . heart failure    . LEFT HEART CATH AND CORONARY ANGIOGRAPHY N/A 08/04/2019   Procedure: LEFT HEART CATH AND CORONARY ANGIOGRAPHY;  Surgeon: Adrian Prows, MD;  Location: [facility];  Service: Cardiovascular;  Laterality: N/A;  . MASS EXCISION Right 12/27/2013   Procedure: MINOR EXCISION OF RIGHT THUMB MUCOID CYST, DEBRIDEMENT OF INTERPHALANGEAL JOINT;  Surgeon: Cammie Sickle, MD;  Location: [facility];  Service: Orthopedics;  Laterality: Right;  . mass on thumb  right  . TOTAL KNEE ARTHROPLASTY Right 02/17/2017  . TOTAL KNEE ARTHROPLASTY Right 02/17/2017   Procedure: TOTAL KNEE ARTHROPLASTY;  Surgeon: Melrose Nakayama, MD;  Location: [facility];  Service: Orthopedics;  Laterality: Right;  . TUBAL LIGATION      Family History  Problem Relation Age of Onset  . Bone cancer Mother   . Heart disease Mother   . Cancer Sister        metastatic; unknown primary  . Diverticulosis Sister   . Tuberculosis Father   . Hypertension Sister   . Colon cancer Neg Hx   . Esophageal cancer Neg Hx   . Pancreatic cancer Neg Hx   . Stomach cancer Neg Hx   . Liver disease Neg Hx   .  Kidney disease Neg Hx   . Rectal cancer Neg Hx   . Pancreatic disease Neg Hx     Social History   Occupational History  . Occupation: hair dresser  Tobacco Use  . Smoking status: Former Smoker    Packs/day: 1.00    Years: 50.00    Pack years: 50.00    Types: Cigarettes    Quit date: 08/08/2019    Years since quitting: 1.0  . Smokeless tobacco: Never Used  Vaping Use  . Vaping Use: Never used  Substance and Sexual Activity  . Alcohol use: No    Alcohol/week: 0.0 standard drinks  . Drug use: No  . Sexual activity: Not on file     Physical Exam: Blood pressure 100/60, pulse 86, temperature (!) 97.5 F (36.4 C), temperature source Temporal, height 5\' 5"  (1.651 m), weight 128 lb 6.4 oz (58.2 kg), SpO2 99 %.  Gen:      No acute  distress Lungs:    No increased respiratory effort, symmetric chest wall excursion, clear to auscultation bilaterally, no wheezes or crackles CV:         Regular rate and rhythm; no murmurs, rubs, or gallops.  No pedal edema   Data Reviewed: Imaging: I have personally reviewed the chest xray 07/30/20 which shows bilateral pleural effusions and cardiomegaly.  PFTs:  PFT Results Latest Ref Rng & Units 08/21/2020  FVC-Pre L 3.00  FVC-Predicted Pre % 96  FVC-Post L 2.80  FVC-Predicted Post % 89  Pre FEV1/FVC % % 68  Post FEV1/FCV % % 72  FEV1-Pre L 2.04  FEV1-Predicted Pre % 86  FEV1-Post L 2.01  DLCO uncorrected ml/min/mmHg 12.61  DLCO UNC% % 62  DLCO corrected ml/min/mmHg 13.63  DLCO COR %Predicted % 67  DLVA Predicted % 85  TLC L 5.77  TLC % Predicted % 110  RV % Predicted % 141   I have personally reviewed the patient's PFTs and they show mild airflow limitation. No significant BD response.  Labs: Lab Results  Component Value Date   WBC 6.0 07/30/2020   HGB 11.2 (L) 07/30/2020   HCT 34.5 (L) 07/30/2020   MCV 91.8 07/30/2020   PLT 196 07/30/2020   Lab Results  Component Value Date   NA 136 08/16/2020   K 4.9 08/16/2020   CL 100 08/16/2020   CO2 28 08/16/2020   BNP elevated at 1856 in feb 2022  Immunization status: Immunization History  Administered Date(s) Administered  . Influenza Split 07/01/2011  . PFIZER(Purple Top)SARS-COV-2 Vaccination 02/16/2020, 03/08/2020  . Zoster 12/22/2011    Assessment:  COPD, new diagnosis FEV1 74% of predicted History of tobacco use disorder, quit 2021 HFrEF 25-30%  Plan/Recommendations:  Continue diuretics and management for heart failure.  Will continue to manage COPD conservatively of medications for now. Continue to abstain from smoking.  I am happy to her back as needed if her symptoms worsen or we need to consider maintenance medications.  Return to Care: Return if symptoms worsen or fail to improve, for  shortness of breath, COPD.   Lenice Llamas, MD Pulmonary and [place]

## 2020-08-24 ENCOUNTER — Telehealth: Payer: Self-pay | Admitting: Radiology

## 2020-08-24 ENCOUNTER — Other Ambulatory Visit (INDEPENDENT_AMBULATORY_CARE_PROVIDER_SITE_OTHER): Payer: Medicare HMO

## 2020-08-24 ENCOUNTER — Other Ambulatory Visit: Payer: Self-pay

## 2020-08-24 DIAGNOSIS — N184 Chronic kidney disease, stage 4 (severe): Secondary | ICD-10-CM

## 2020-08-24 DIAGNOSIS — N179 Acute kidney failure, unspecified: Secondary | ICD-10-CM

## 2020-08-24 LAB — BASIC METABOLIC PANEL
BUN: 27 mg/dL — ABNORMAL HIGH (ref 6–23)
CO2: 28 mEq/L (ref 19–32)
Calcium: 9.3 mg/dL (ref 8.4–10.5)
Chloride: 102 mEq/L (ref 96–112)
Creatinine, Ser: 3.06 mg/dL — ABNORMAL HIGH (ref 0.40–1.20)
GFR: 14.96 mL/min — CL (ref >60.00)
Glucose, Bld: 104 mg/dL — ABNORMAL HIGH (ref 70–99)
Potassium: 4.4 mEq/L (ref 3.5–5.1)
Sodium: 138 mEq/L (ref 135–145)

## 2020-08-24 NOTE — Telephone Encounter (Signed)
[facility] called a critical GFR - 14.96, results given to Dr Diona Browner

## 2020-08-27 IMAGING — DX DG KNEE COMPLETE 4+V*R*
5 series · 5 of 5 positions shown · non-contrast
Comparison: January 11, 2018

CLINICAL DATA: Pain status post knee replacement.

EXAM:
RIGHT KNEE - COMPLETE 4+ VIEW

[knee ap]
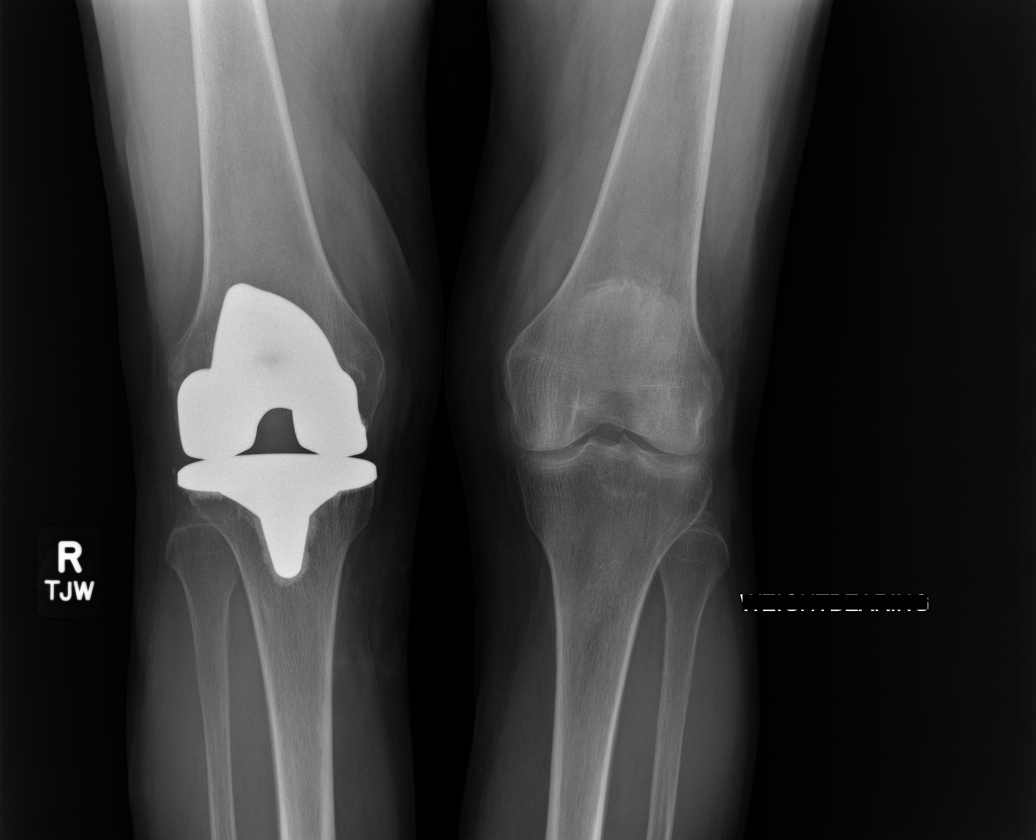

[knee lat]
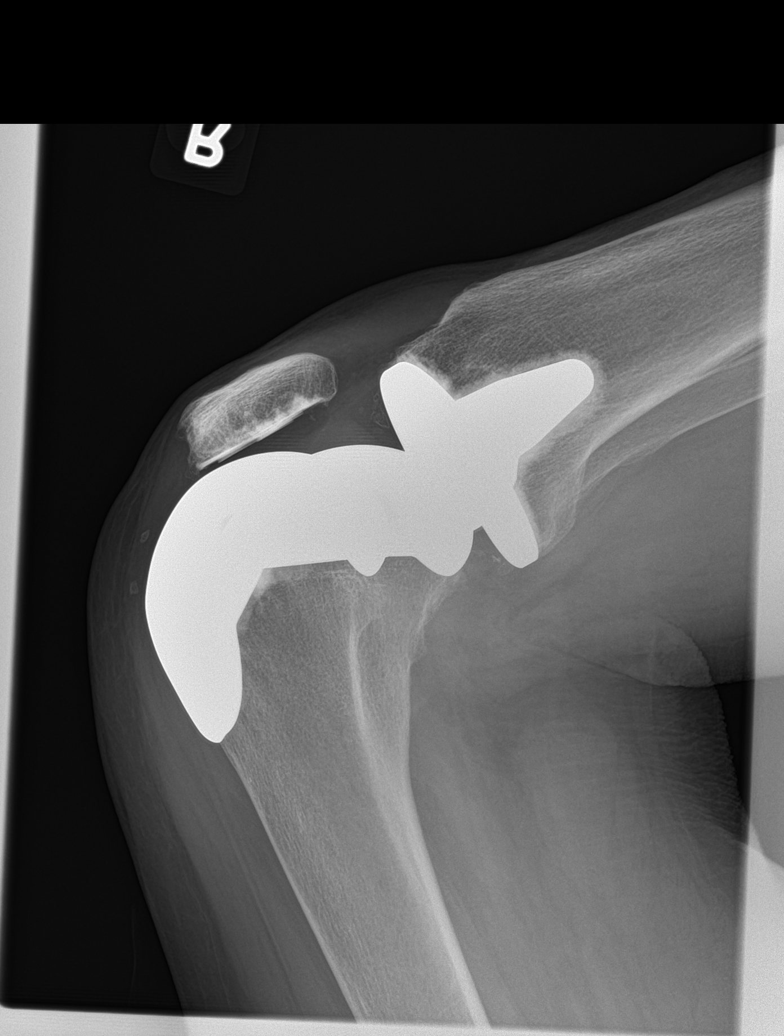

[patella skyline]
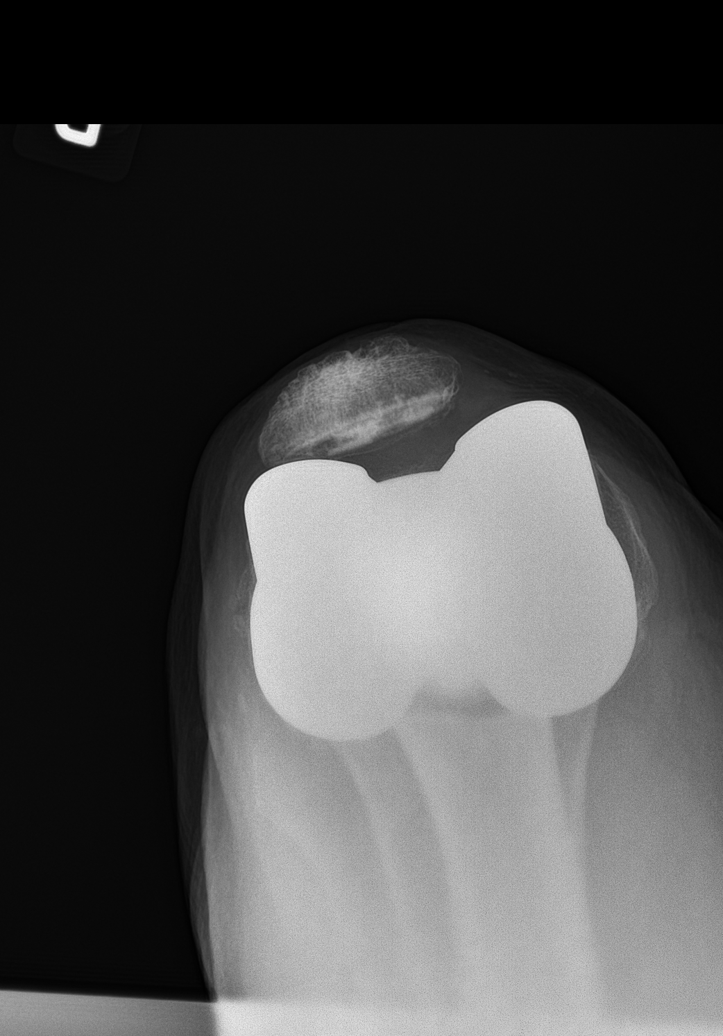

[knee obl (1 of 2)]
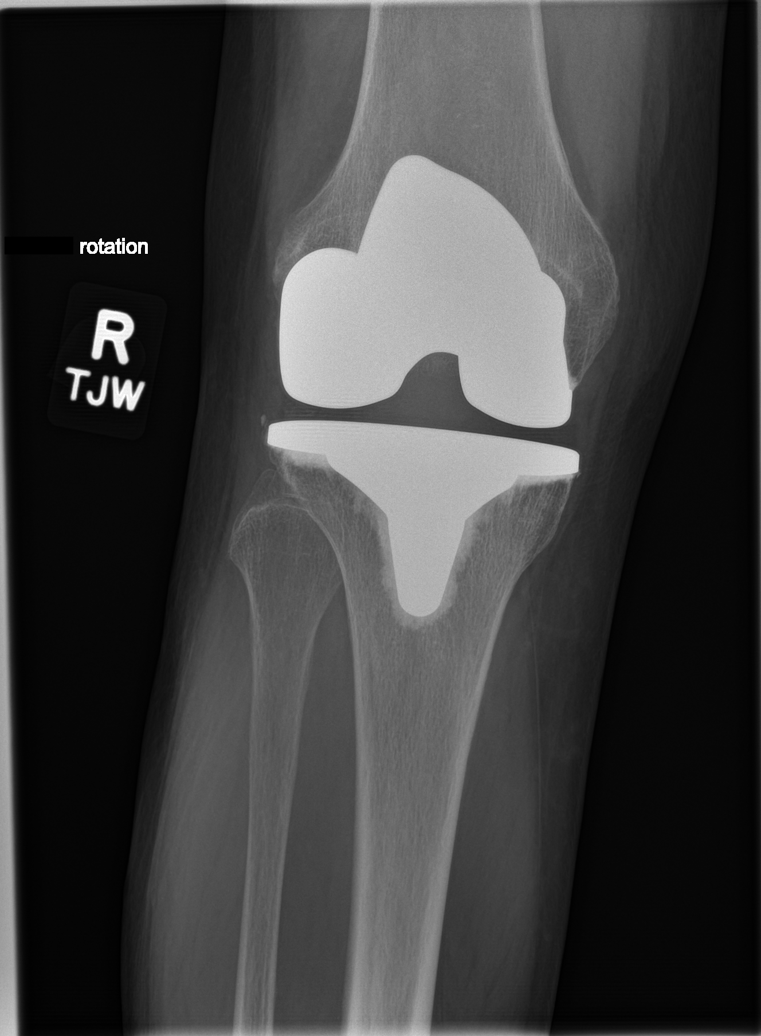

[knee obl (2 of 2)]
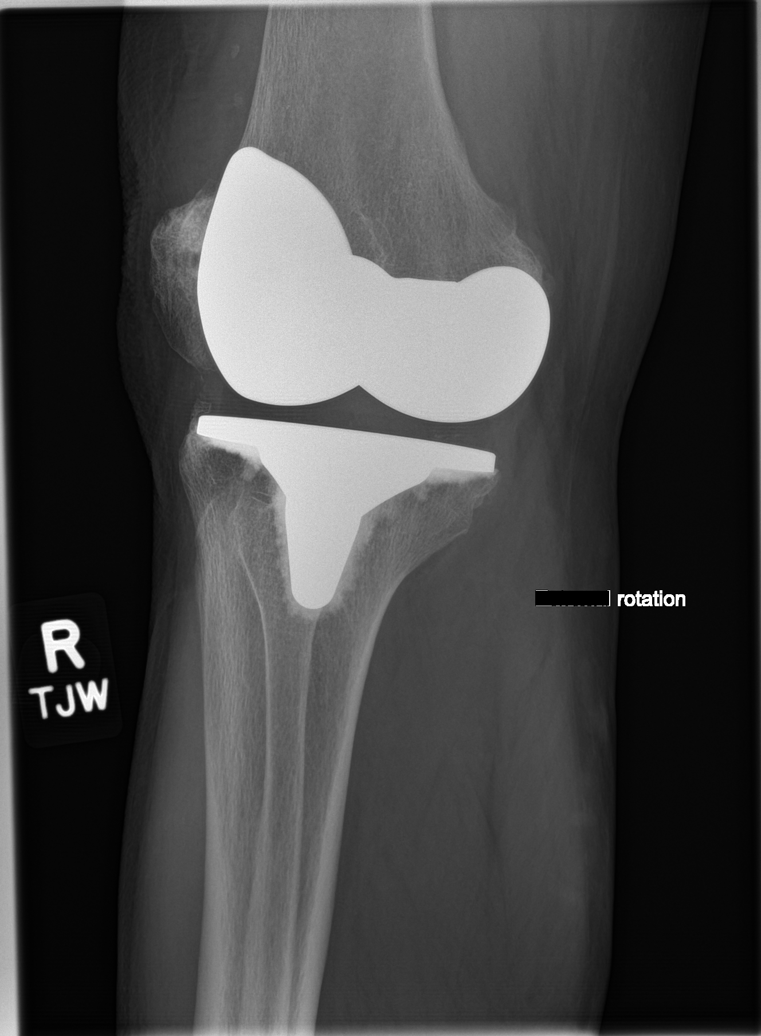

[5 of 5 positions shown; findings below may reference images not displayed]

FINDINGS: The patient is status post total knee arthroplasty on the right. The
hardware appears intact. There is no periprosthetic fracture. No
significant joint effusion. No definite evidence for loosening.
IMPRESSION: Status post right total knee arthroplasty without evidence for
hardware complication.

## 2020-08-28 ENCOUNTER — Encounter: Payer: Self-pay | Admitting: Cardiology

## 2020-08-28 ENCOUNTER — Other Ambulatory Visit: Payer: Self-pay

## 2020-08-28 ENCOUNTER — Ambulatory Visit: Payer: Medicare HMO | Admitting: Cardiology

## 2020-08-28 VITALS — BP 116/52 | HR 83 | Temp 98.0°F | Resp 16 | Ht 65.0 in | Wt 129.0 lb

## 2020-08-28 DIAGNOSIS — Z7901 Long term (current) use of anticoagulants: Secondary | ICD-10-CM

## 2020-08-28 DIAGNOSIS — I5022 Chronic systolic (congestive) heart failure: Secondary | ICD-10-CM | POA: Diagnosis not present

## 2020-08-28 DIAGNOSIS — E782 Mixed hyperlipidemia: Secondary | ICD-10-CM

## 2020-08-28 DIAGNOSIS — I480 Paroxysmal atrial fibrillation: Secondary | ICD-10-CM

## 2020-08-28 DIAGNOSIS — Z87891 Personal history of nicotine dependence: Secondary | ICD-10-CM

## 2020-08-28 DIAGNOSIS — I10 Essential (primary) hypertension: Secondary | ICD-10-CM

## 2020-08-28 DIAGNOSIS — Z95810 Presence of automatic (implantable) cardiac defibrillator: Secondary | ICD-10-CM | POA: Diagnosis not present

## 2020-08-28 DIAGNOSIS — I447 Left bundle-branch block, unspecified: Secondary | ICD-10-CM

## 2020-08-28 DIAGNOSIS — Z8679 Personal history of other diseases of the circulatory system: Secondary | ICD-10-CM | POA: Diagnosis not present

## 2020-08-28 DIAGNOSIS — I469 Cardiac arrest, cause unspecified: Secondary | ICD-10-CM | POA: Diagnosis not present

## 2020-08-28 DIAGNOSIS — I429 Cardiomyopathy, unspecified: Secondary | ICD-10-CM

## 2020-08-28 DIAGNOSIS — Z8673 Personal history of transient ischemic attack (TIA), and cerebral infarction without residual deficits: Secondary | ICD-10-CM | POA: Diagnosis not present

## 2020-08-28 DIAGNOSIS — I4901 Ventricular fibrillation: Secondary | ICD-10-CM

## 2020-08-28 NOTE — Progress Notes (Signed)
[name] Date of Birth: December 03, 1950 MRN: [id number] Primary Care Provider:Bedsole, Mervyn Gay, MD Primary Cardiologist: Rex Kras, DO Electrophysiologist: Dr. Reggy Eye   Date: 08/28/20 Last Office Visit: 07/05/2020  Chief Complaint  Patient presents with  . Congestive Heart Failure  . Follow-up    hospital  . Shortness of Breath   HPI  [name] is a 70 y.o. female who presents to the office with a chief complaint of "management of  heart failure." Patient's past medical history and cardiac risk factors include: Hypertension, hyperlipidemia, former smoker, history of Arnold-Chiari malformation, history of VT/VF, history of torsades, cardiac arrest, nonischemic cardiomyopathy, peripheral vascular disease, paroxysmal atrial fibrillation, left bundle branch block, history of CVA.  Patient is accompanied by her daughter [name] at today's office visit.   Patient was last seen in the office back in January 2022 and she was doing well from a cardiovascular standpoint after her recent hospitalization in December 2021.  She was asked to follow-up in 3 months around April 2022.  However, since then she was having difficulty in breathing and EMS was called and patient was taken to [facility] on July 30, 2020 and was hospitalized until August 03, 2020.  Given her progressive chronic kidney disease her torsemide was discontinued and she was transitioned to Lasix.  She was diuresed once again with parenteral medications.  And discharged home on Lasix 20 mg p.o. twice daily.  She had follow-up blood work which noted worsening kidney function and therefore was recommended to reduce the Lasix to 20 mg p.o. daily.  Of note, patient was also started on vancomycin for her C. difficile infection.  Overall patient is euvolemic and not in congestive heart failure at today's office visit.  Clinically she feels and looks the best that she has in the last several months.  She denies  any chest pain, shortness of breath, lightheadedness, dizziness, orthopnea, paroxysmal nocturnal dyspnea.  Patient's weight has remained stable since discharge.  Patient states that she has an upcoming appointment with her nephrologist on September 10, 2020.  Last BiV ICD interrogation was performed on 05/25/2020.  Patient was noted to have mode switches suggestive of paroxysmal atrial fibrillation longest episode was 41 minutes.  0 VT/VF episodes.  And transthoracic impedance trends not suggestive of fluid accumulation.   ALLERGIES: Allergies  Allergen Reactions  . Shellfish-Derived Products Anaphylaxis  . Ativan [Lorazepam] Other (See Comments)    Makes "skin crawl" and insomnia  . Buspar [Buspirone] Nausea Only    Sweating and dizzy  . Clarithromycin Swelling  . Codeine Nausea And Vomiting  . Doxycycline Other (See Comments)    Unknown  . Guaifenesin Other (See Comments)    Palpitations  . Oxycodone Nausea And Vomiting  . Prednisone Nausea And Vomiting and Other (See Comments)    Makes my heart race  . Statins Other (See Comments)    Muscle pain  . Sulfonamide Derivatives Nausea And Vomiting    Achiness  . Tetracycline Nausea Only  . Vicodin [Hydrocodone-Acetaminophen] Nausea And Vomiting  . Famotidine Rash  . Latex Rash  . Omeprazole Nausea Only    Cough, shortness of breath - patient doesn't remember  . Peanut-Containing Drug Products Itching and Rash  . Protonix [Pantoprazole] Rash  . Wellbutrin [Bupropion] Palpitations   MEDICATION LIST PRIOR TO VISIT: Current Outpatient Medications on File Prior to Visit  Medication Sig Dispense Refill  . acetaminophen (TYLENOL) 500 MG tablet Take 1,000 mg by mouth 2 (two) times daily  as needed (pain).    [name] albuterol (VENTOLIN HFA) 108 (90 Base) MCG/ACT inhaler Inhale 2 puffs into the lungs every 2 (two) hours as needed for wheezing or shortness of breath. 1 each 0  . apixaban (ELIQUIS) 2.5 MG TABS tablet Take 1 tablet (2.5 mg total) by  mouth 2 (two) times daily. 60 tablet 0  . aspirin 81 MG chewable tablet Chew 1 tablet (81 mg total) by mouth daily. 30 tablet 0  . carvedilol (COREG) 3.125 MG tablet Take 1 tablet (3.125 mg total) by mouth 2 (two) times daily with a meal. 60 tablet 0  . Cholecalciferol (VITAMIN D3 PO) Take 1,000 Units by mouth in the morning and at bedtime.    [name] ezetimibe (ZETIA) 10 MG tablet TAKE 1 TABLET BY MOUTH EVERY DAY 90 tablet 1  . fluticasone (FLONASE) 50 MCG/ACT nasal spray Place into both nostrils daily.    . furosemide (LASIX) 20 MG tablet Take 1 tablet (20 mg total) by mouth 2 (two) times daily. (Patient taking differently: Take 20 mg by mouth daily.) 60 tablet 0  . Magnesium 400 MG TABS Take 400 mg by mouth 2 (two) times daily.     . nitroGLYCERIN (NITROSTAT) 0.4 MG SL tablet Place 1 tablet (0.4 mg total) under the tongue every 5 (five) minutes as needed for chest pain. 30 tablet 3  . ondansetron (ZOFRAN ODT) 4 MG disintegrating tablet Take 1 tablet (4 mg total) by mouth every 8 (eight) hours as needed for nausea or vomiting. 20 tablet 1  . PEPCID 20 MG tablet Take 1 tablet (20 mg total) by mouth 2 (two) times daily. 60 tablet 3  . rosuvastatin (CRESTOR) 10 MG tablet TAKE 1 TABLET (10 MG TOTAL) BY MOUTH DAILY AT 6 PM. 90 tablet 1  . sertraline (ZOLOFT) 50 MG tablet Take 50 mg by mouth at bedtime.    . vancomycin (VANCOCIN) 125 MG capsule Take 1 capsule (125 mg total) by mouth 4 (four) times daily for 14 days, THEN 1 capsule (125 mg total) in the morning and at bedtime for 5 days, THEN 1 capsule (125 mg total) daily for 5 days, THEN 1 capsule (125 mg total) every other day for 5 days. 74 capsule 0  . vitamin B-12 (CYANOCOBALAMIN) 1000 MCG tablet Take 1,000 mcg by mouth daily.     No current facility-administered medications on file prior to visit.    PAST MEDICAL HISTORY: Past Medical History:  Diagnosis Date  . Allergy   . Anxiety   . Arnold-Chiari malformation ([place])   . Arthritis   . CHF  (congestive heart failure) ([place])   . Chronic systolic heart failure ([place]) 11/03/2019  . Coronary artery disease   . Dyspnea   . Encounter for assessment of implantable cardioverter-defibrillator (ICD) 11/10/2019  . GERD (gastroesophageal reflux disease)   . H. pylori infection    3 years ago  . H/O cardiac arrest    Hx of VT/Vfib arrest  . Hyperlipidemia   . ICD BiV Medtronic model [id number] Claria Quad CRT-D SureScan ICD 08/25/2019   . Incontinence   . Paroxysmal atrial fibrillation ([place]) 08/06/2019  . Pulmonary edema 2021  . Syncope and collapse    Per pt, denies passing out  . Tobacco use disorder   . Unspecified essential hypertension     PAST SURGICAL HISTORY: Past Surgical History:  Procedure Laterality Date  . APPENDECTOMY    . ARNOLD CHIARI SURGERY     neurocranial surgery  . BIV ICD  INSERTION CRT-D N/A 08/25/2019   Procedure: BIV ICD INSERTION CRT-D;  Surgeon: Constance Haw, MD;  Location: [facility];  Service: Cardiovascular;  Laterality: N/A;  . BLEPHAROPLASTY     Bil  . C-EYE SURGERY PROCEDURE    . CARDIAC CATHETERIZATION    . CHOLECYSTECTOMY    . EYE SURGERY    . heart failure    . LEFT HEART CATH AND CORONARY ANGIOGRAPHY N/A 08/04/2019   Procedure: LEFT HEART CATH AND CORONARY ANGIOGRAPHY;  Surgeon: Adrian Prows, MD;  Location: [facility];  Service: Cardiovascular;  Laterality: N/A;  . MASS EXCISION Right 12/27/2013   Procedure: MINOR EXCISION OF RIGHT THUMB MUCOID CYST, DEBRIDEMENT OF INTERPHALANGEAL JOINT;  Surgeon: Cammie Sickle, MD;  Location: [facility];  Service: Orthopedics;  Laterality: Right;  . mass on thumb  right  . TOTAL KNEE ARTHROPLASTY Right 02/17/2017  . TOTAL KNEE ARTHROPLASTY Right 02/17/2017   Procedure: TOTAL KNEE ARTHROPLASTY;  Surgeon: Melrose Nakayama, MD;  Location: [facility];  Service: Orthopedics;  Laterality: Right;  . TUBAL LIGATION      FAMILY HISTORY: The patient family history includes Bone cancer  in her mother; Cancer in her sister; Diverticulosis in her sister; Heart disease in her mother; Hypertension in her sister; Tuberculosis in her father.   SOCIAL HISTORY:  The patient  reports that she quit smoking about 12 months ago. Her smoking use included cigarettes. She has a 50.00 pack-year smoking history. She has never used smokeless tobacco. She reports that she does not drink alcohol and does not use drugs.  Review of Systems  Constitutional: Negative for chills, fever and weight loss.  HENT: Negative for hoarse voice and nosebleeds.   Eyes: Negative for discharge, double vision and pain.  Cardiovascular: Negative for chest pain, claudication, dyspnea on exertion, leg swelling, near-syncope, orthopnea, palpitations, paroxysmal nocturnal dyspnea and syncope.  Respiratory: Negative for hemoptysis and shortness of breath.   Musculoskeletal: Negative for muscle cramps and myalgias.  Gastrointestinal: Negative for abdominal pain, constipation, diarrhea, hematemesis, hematochezia, melena, nausea and vomiting.  Neurological: Negative for dizziness and light-headedness.   PHYSICAL EXAM: Vitals with BMI 08/28/2020 08/21/2020 08/16/2020  Height 5\' 5"  5\' 5"  5\' 4"   Weight 129 lbs 128 lbs 6 oz 127 lbs 4 oz  BMI 21.47 40.98 11.91  Systolic 478 295 621  Diastolic 52 60 72  Pulse 83 86 78   CONSTITUTIONAL: Appears older than stated age, hemodynamically stable, no acute distress.    SKIN: Skin is warm and dry. No rash noted. No cyanosis. No pallor. No jaundice HEAD: Normocephalic and atraumatic.  EYES: No scleral icterus MOUTH/THROAT: Moist oral membranes.  NECK: No JVD present. No thyromegaly noted.   LYMPHATIC: No visible cervical adenopathy.  CHEST Normal respiratory effort. No intercostal retractions.  BiV ICD noted left infraclavicular region. LUNGS: Clear to auscultation bilaterally.  No stridor. No wheezes. No rales.  CARDIOVASCULAR: Regular, positive H0-Q6, soft holosystolic murmur heard  at the apex radiating to axilla, no gallops or rubs appreciated ABDOMINAL: Nonobese, soft, nontender, nondistended, positive bowel sounds in all 4 quadrants no apparent ascites.  EXTREMITIES: No bilateral peripheral edema. 2+ right femoral pulse, 1+ left femoral pulse, none palpable bilateral popliteal, dorsalis pedis or posterior tibial pulses.  Warm to touch bilaterally.  No discoloration or cyanosis present. HEMATOLOGIC: No significant bruising NEUROLOGIC: Oriented to person, place, and time. Nonfocal. Normal muscle tone.  PSYCHIATRIC: Normal mood and affect. Normal behavior. Cooperative  CARDIAC DATABASE: EKG:  08/12/2019: Normal sinus rhythm with ventricular rate of 61 bpm, left axis deviation, left bundle branch block, nonspecific T wave abnormalities.  Prior EKG dated 08/04/2019 shows a normal sinus rhythm, left axis deviation, left bundle branch block. 12/08/2019: Atrial paced,  Ventricular sensed rhythm. 07/05/2020: Normal sinus rhythm, 71 bpm, lateral infarct, diffuse T wave inversions in the precordial leads most likely secondary to pacing, without underlying injury pattern.  Similar findings on prior EKG dated 11/2019.  Echocardiogram: 08/01/2020:  1. Left ventricular ejection fraction, by estimation, is 25 to 30%. The  left ventricle has severely decreased function. The left ventricle  demonstrates global hypokinesis. There is moderate asymmetric left  ventricular hypertrophy of the basal-septal  segment. Left ventricular diastolic parameters are consistent with Grade I  diastolic dysfunction (impaired relaxation).  2. Right ventricular systolic function is normal. The right ventricular  size is normal. There is normal pulmonary artery systolic pressure. The  estimated right ventricular systolic pressure is 76.5 mmHg.  3. The mitral valve is degenerative. Mild mitral valve regurgitation.  4. The aortic valve was not well visualized. Aortic valve regurgitation  is not visualized.  No aortic stenosis is present.  5. The inferior vena cava is normal in size with greater than 50%  respiratory variability, suggesting right atrial pressure of 3 mmHg.   Heart Catheterization: 08/04/19:  LV: Global hypokinesis, upper limit of normal size, EF 25 to 30%. Left main: Normal. LAD: Mild diffuse disease.  Proximal LAD has a 30 to 40% stenosis, mid segment has a 20 to 30% stenosis, scattered disease noted in the LAD.  Brisk flow. Circumflex: Again scattered disease noted in the circumflex.  Mid segment has at most a 30 to 40% stenosis which appears to be eccentric and calcified. RCA: Dominant.  Mild diffuse disease again noted.  Mid segment after the origin of RV branch has a 70 to 80% stenosis.  Brisk flow is evident throughout the RCA. Impression: Findings consistent with nonischemic cardiomyopathy.  Although she has significant disease in the right coronary artery, this does not explain her presentation with global hypokinesis, neither does it explain VF arrest as the lesion does not appear to be unstable.   Biventricular ICD implantation 08/25/2019: ICD BiV Medtronic model [id number] Claria Quad CRT-D SureScan ICD: Scheduled Remote ICD check  05/25/2020: 287 AHR episodes, review of EGMs demonstrates paroxysmal atrial fibrillation.  Longest 41 minutes.  1.9% cumulative atrial arrhythmia burden.  There were 0 VT/VF episodes. VSE correlates with A. fib with RVR. Trans-thoracic impedance trends and the Optivol Fluid Index does not suggests fluid accumulation. Battery longevity is 8 years. RA pacing is 95.5 %, RV pacing is 8.5 %, and LV pacing is 99.5 %.  Carotid duplex: 08/03/2019: Right Carotid: Velocities in the right ICA are consistent with a 1-39% stenosis. Left Carotid: Velocities in the left ICA are consistent with a 1-39%  stenosis. Vertebrals: Bilateral vertebral arteries demonstrate antegrade flow. Subclavians: Normal flow hemodynamics were seen in bilateral subclavian  arteries.  LABORATORY DATA: CBC Latest Ref Rng & Units 07/30/2020 06/12/2020 04/23/2020  WBC 4.0 - 10.5 K/uL 6.0 6.0 5.4  Hemoglobin 12.0 - 15.0 g/dL 11.2(L) 10.6(L) 10.7(L)  Hematocrit 36.0 - 46.0 % 34.5(L) 32.4(L) 32.8(L)  Platelets 150 - 400 K/uL 196 173 223.0    CMP Latest Ref Rng & Units 08/24/2020 08/16/2020 08/03/2020  Glucose 70 - 99 mg/dL 104(H) 84 95  BUN 6 - 23 mg/dL 27(H) 39(H) 35(H)  Creatinine 0.40 - 1.20 mg/dL 3.06(H) 3.57(H) 3.25(H)  Sodium  135 - 145 mEq/L 138 136 138  Potassium 3.5 - 5.1 mEq/L 4.4 4.9 3.3(L)  Chloride 96 - 112 mEq/L 102 100 94(L)  CO2 19 - 32 mEq/L 28 28 32  Calcium 8.4 - 10.5 mg/dL 9.3 9.6 9.2  Total Protein 6.5 - 8.1 g/dL - - -  Total Bilirubin 0.3 - 1.2 mg/dL - - -  Alkaline Phos 38 - 126 U/L - - -  AST 15 - 41 U/L - - -  ALT 0 - 44 U/L - - -    Lipid Panel     Component Value Date/Time   CHOL 114 09/21/2019 1416   TRIG 147 09/21/2019 1416   HDL 33 (L) 09/21/2019 1416   CHOLHDL 7.5 08/03/2019 0249   VLDL 26 08/03/2019 0249   LDLCALC 55 09/21/2019 1416   LDLDIRECT 193.0 09/02/2016 0839   LABVLDL 26 09/21/2019 1416    Lab Results  Component Value Date   HGBA1C 5.7 (H) 08/03/2019   No components found for: NTPROBNP Lab Results  Component Value Date   TSH 1.487 12/07/2019   TSH 0.550 08/24/2019   TSH 0.75 08/23/2019    FINAL MEDICATION LIST END OF ENCOUNTER:   Current Outpatient Medications:  .  acetaminophen (TYLENOL) 500 MG tablet, Take 1,000 mg by mouth 2 (two) times daily as needed (pain)., Disp: , Rfl:  .  albuterol (VENTOLIN HFA) 108 (90 Base) MCG/ACT inhaler, Inhale 2 puffs into the lungs every 2 (two) hours as needed for wheezing or shortness of breath., Disp: 1 each, Rfl: 0 .  apixaban (ELIQUIS) 2.5 MG TABS tablet, Take 1 tablet (2.5 mg total) by mouth 2 (two) times daily., Disp: 60 tablet, Rfl: 0 .  aspirin 81 MG chewable tablet, Chew 1 tablet (81 mg total) by mouth daily., Disp: 30 tablet, Rfl: 0 .  carvedilol (COREG)  3.125 MG tablet, Take 1 tablet (3.125 mg total) by mouth 2 (two) times daily with a meal., Disp: 60 tablet, Rfl: 0 .  Cholecalciferol (VITAMIN D3 PO), Take 1,000 Units by mouth in the morning and at bedtime., Disp: , Rfl:  .  ezetimibe (ZETIA) 10 MG tablet, TAKE 1 TABLET BY MOUTH EVERY DAY, Disp: 90 tablet, Rfl: 1 .  fluticasone (FLONASE) 50 MCG/ACT nasal spray, Place into both nostrils daily., Disp: , Rfl:  .  furosemide (LASIX) 20 MG tablet, Take 1 tablet (20 mg total) by mouth 2 (two) times daily. (Patient taking differently: Take 20 mg by mouth daily.), Disp: 60 tablet, Rfl: 0 .  Magnesium 400 MG TABS, Take 400 mg by mouth 2 (two) times daily. , Disp: , Rfl:  .  nitroGLYCERIN (NITROSTAT) 0.4 MG SL tablet, Place 1 tablet (0.4 mg total) under the tongue every 5 (five) minutes as needed for chest pain., Disp: 30 tablet, Rfl: 3 .  ondansetron (ZOFRAN ODT) 4 MG disintegrating tablet, Take 1 tablet (4 mg total) by mouth every 8 (eight) hours as needed for nausea or vomiting., Disp: 20 tablet, Rfl: 1 .  PEPCID 20 MG tablet, Take 1 tablet (20 mg total) by mouth 2 (two) times daily., Disp: 60 tablet, Rfl: 3 .  rosuvastatin (CRESTOR) 10 MG tablet, TAKE 1 TABLET (10 MG TOTAL) BY MOUTH DAILY AT 6 PM., Disp: 90 tablet, Rfl: 1 .  sertraline (ZOLOFT) 50 MG tablet, Take 50 mg by mouth at bedtime., Disp: , Rfl:  .  vancomycin (VANCOCIN) 125 MG capsule, Take 1 capsule (125 mg total) by mouth 4 (four) times daily for 14 days,  THEN 1 capsule (125 mg total) in the morning and at bedtime for 5 days, THEN 1 capsule (125 mg total) daily for 5 days, THEN 1 capsule (125 mg total) every other day for 5 days., Disp: 74 capsule, Rfl: 0 .  vitamin B-12 (CYANOCOBALAMIN) 1000 MCG tablet, Take 1,000 mcg by mouth daily., Disp: , Rfl:   IMPRESSION:    ICD-10-CM   1. Chronic HFrEF (heart failure with reduced ejection fraction) (HCC)  I50.22   2. Cardiomyopathy  I42.9   3. Essential hypertension  I10   4. Paroxysmal atrial  fibrillation (HCC)  I48.0   5. Long term current use of anticoagulant  Z79.01   6. Cardiac arrest with ventricular fibrillation (HCC)  I46.9    I49.01   7. Biventricular ICD (implantable cardioverter-defibrillator) in place  Z95.810   8. History of torsades de pointes  Z86.79   9. Left bundle branch block  I44.7   10. History of CVA (cerebrovascular accident)  Z9.73   11. Mixed hyperlipidemia  E78.2   12. Former smoker  Z87.891      RECOMMENDATIONS: ANDRAYA FRIGON is a 70 y.o. female whose past medical history and cardiac risk factors include: Hypertension, hyperlipidemia, former smoker, history of Arnold-Chiari malformation, history of VT/VF, history of torsades, cardiac arrest, nonischemic cardiomyopathy, peripheral vascular disease, paroxysmal atrial fibrillation, left bundle branch block, history of CVA.  Chronic heart failure with reduced ejection fraction, stage C, NYHA class II:  Recent hospitalizations include December 2021, February 2022  Continue current medical therapy.  Unable to be on ARB's, Arni, Aldactone secondary to progressive CKD.  She was also on hydralazine/Isordil combination and was not able to tolerate it secondary to feeling tired, fatigued.  Torsemide discontinued during her recent hospitalization and transition to Lasix.  Diuretics currently managed by nephrology.  Thankful for their input given her progressive CKD.  Strict I's and O's, and daily weights recommended.  Fluid restriction to less than 2 L/day, sodium restriction to less than 1.5 g/day.  Cardiomyopathy, suggestive of nonischemic: See above  Status post BiV ICD status post ventricular fibrillation arrest:  Medtronic model [id number] Claria Quad CRT-D SureScan (serial Number [id number] )   Last device check reviewed with patient and daughter.   Left bundle branch block: Continue to monitor.  Paroxysmal atrial fibrillation:  Rate control: Coreg.    Rhythm control: None.  Thromboembolic  prophylaxis currently on Eliquis.  Patient does not endorse any evidence of bleeding.  Agree with reducing the dose of Eliquis to 2.5 mg p.o. twice daily.  Samples provided.  CHA2DS2VASc score: 7. Annual stroke risk: 9.6% (congestive heart failure, hypertension, age, history of stroke, medical history of peripheral vascular disease, and gender)  Long-term oral anticoagulation use:  Indication paroxysmal atrial fibrillation.  Patient did not endorse any evidence of bleeding  Mixed hyperlipidemia:   Currently on Crestor and Zetia.  We will continue to monitor.  Patient does not endorse any evidence of myalgias.  History of CVA: Educated on the importance of secondary prevention.  Will defer further management to primary and neurology.  Former smoker: Educated on the importance of continued smoking cessation.  No orders of the defined types were placed in this encounter.  --Continue cardiac medications as reconciled in final medication list. --Return in about 3 months (around 11/28/2020) for Follow up, heart failure management... Or sooner if needed. --Continue follow-up with your primary care physician regarding the management of your other chronic comorbid conditions.  Patient's questions and concerns were addressed  to her and her daughter's satisfaction. She and her daughter voices understanding of the instructions provided during this encounter.   This note was created using a voice recognition software as a result there may be grammatical errors inadvertently enclosed that do not reflect the nature of this encounter. Every attempt is made to correct such errors.  Total time spent: 37 minutes.  Reviewed the most recent hospitalization records, most recent echocardiogram results, medication reconciliation, most recent lab work, discussing disease management, and coordination of care.  Rex Kras, [place], [facility]  Pager: [phone] Office: [phone]

## 2020-09-04 ENCOUNTER — Other Ambulatory Visit: Payer: Self-pay

## 2020-09-04 MED ORDER — FUROSEMIDE 20 MG PO TABS: 20.0000 mg | ORAL_TABLET | Freq: Two times a day (BID) | ORAL | 2 refills | Status: DC

## 2020-09-04 MED ORDER — CARVEDILOL 3.125 MG PO TABS: 3.1250 mg | ORAL_TABLET | Freq: Two times a day (BID) | ORAL | 3 refills | Status: DC

## 2020-09-06 ENCOUNTER — Encounter: Payer: Self-pay | Admitting: Gastroenterology

## 2020-09-10 DIAGNOSIS — N184 Chronic kidney disease, stage 4 (severe): Secondary | ICD-10-CM | POA: Diagnosis not present

## 2020-09-10 DIAGNOSIS — I129 Hypertensive chronic kidney disease with stage 1 through stage 4 chronic kidney disease, or unspecified chronic kidney disease: Secondary | ICD-10-CM | POA: Diagnosis not present

## 2020-09-10 DIAGNOSIS — I509 Heart failure, unspecified: Secondary | ICD-10-CM | POA: Diagnosis not present

## 2020-09-14 ENCOUNTER — Other Ambulatory Visit: Payer: Self-pay | Admitting: Gastroenterology

## 2020-09-20 DIAGNOSIS — K529 Noninfective gastroenteritis and colitis, unspecified: Secondary | ICD-10-CM | POA: Insufficient documentation

## 2020-09-20 NOTE — Assessment & Plan Note (Signed)
Euvolemic in office today. Followed by Cardiology.

## 2020-09-20 NOTE — Assessment & Plan Note (Signed)
Stable, chronic.  Continue current medication.  On crestor and zetia  Followed by  Dr. Terri Skains.

## 2020-09-20 NOTE — Assessment & Plan Note (Signed)
Dr. Shearon Stalls, pulmonologist.. planning pulmonary function test to eval SOB at night.  Felt most likely  Due to GI issues

## 2020-09-20 NOTE — Assessment & Plan Note (Signed)
Stable, chronic.  Continue current medication.   Coreg 12.5 BID

## 2020-09-20 NOTE — Assessment & Plan Note (Signed)
due to  Decreased blood flow when she has cardiac arrest.:  GFR 13.. Dr. Ronald Lobo

## 2020-09-20 NOTE — Assessment & Plan Note (Signed)
Stable, chronic.  Continue current medication.   Sertraline 50 mg daily 

## 2020-09-20 NOTE — Assessment & Plan Note (Signed)
Using torsemide  MWF prn. Occ wakes up at night wheezing, but no DOE... work up in progress.

## 2020-09-20 NOTE — Assessment & Plan Note (Signed)
Has small  hiatal hernia.. had barium swallow showed  Stricture. Allergic to protonix, prilosec.. using pepcid AC BID, Tums.

## 2020-09-20 NOTE — Assessment & Plan Note (Signed)
She is seeing Dr. Silverio Decamp for chronic diarrhea... Cdiff  Few months ago now treated.

## 2020-09-20 NOTE — Assessment & Plan Note (Signed)
Due for re-eval. 

## 2020-09-24 ENCOUNTER — Other Ambulatory Visit: Payer: Self-pay

## 2020-09-24 ENCOUNTER — Ambulatory Visit: Payer: Medicare HMO | Admitting: Cardiology

## 2020-09-24 ENCOUNTER — Encounter: Payer: Self-pay | Admitting: Cardiology

## 2020-09-24 VITALS — BP 120/70 | HR 85 | Ht 65.0 in | Wt 128.6 lb

## 2020-09-24 DIAGNOSIS — I5022 Chronic systolic (congestive) heart failure: Secondary | ICD-10-CM

## 2020-09-24 NOTE — Progress Notes (Signed)
Electrophysiology Office Note   Date:  09/24/2020   ID:  [name], DOB [birth year], MRN [id number]  PCP:  Janice Sanders, MD  Cardiologist:  [name] Primary Electrophysiologist:  Janice Abate [name], MD    Chief Complaint: ICD   History of Present Illness: [name] is a 70 y.o. female who is being seen today for the evaluation of ICD at the request of No ref. provider found. Presenting today for electrophysiology evaluation.  She has a history significant for CVA, VT/VF arrest, torsades, nonischemic cardiomyopathy, PVD, hyperlipidemia, hypertension.  She is status post Medtronic CRT-D implanted 08/25/2019.  Today, denies symptoms of palpitations, chest pain, shortness of breath, orthopnea, PND, lower extremity edema, claudication, dizziness, presyncope, syncope, bleeding, or neurologic sequela. The patient is tolerating medications without difficulties.  Since being seen, she has had a hospitalization for heart failure.  Her heart failure medications were adjusted and she was put on Lasix.  She does have CKD and has follow-up with nephrology.  Aside from that, she has no complaints today.  We did have a long discussion over goals of care.  She [name] discuss this further with her primary cardiologist and her family.   Past Medical History:  Diagnosis Date  . Allergy   . Anxiety   . Arnold-Chiari malformation ([place])   . Arthritis   . CHF (congestive heart failure) ([place])   . Chronic systolic heart failure ([place]) 11/03/2019  . Coronary artery disease   . Dyspnea   . Encounter for assessment of implantable cardioverter-defibrillator (ICD) 11/10/2019  . GERD (gastroesophageal reflux disease)   . H. pylori infection    3 years ago  . H/O cardiac arrest    Hx of VT/Vfib arrest  . Hyperlipidemia   . ICD BiV Medtronic model [id number] [place] Quad CRT-D SureScan ICD 08/25/2019   . Incontinence   . Paroxysmal atrial fibrillation ([place]) 08/06/2019  . Pulmonary edema 2021  . Syncope and  collapse    Per pt, denies passing out  . Tobacco use disorder   . Unspecified essential hypertension    Past Surgical History:  Procedure Laterality Date  . APPENDECTOMY    . ARNOLD CHIARI SURGERY     neurocranial surgery  . BIV ICD INSERTION CRT-D N/A 08/25/2019   Procedure: BIV ICD INSERTION CRT-D;  Surgeon: Constance Haw, MD;  Location: [facility];  Service: Cardiovascular;  Laterality: N/A;  . BLEPHAROPLASTY     Bil  . C-EYE SURGERY PROCEDURE    . CARDIAC CATHETERIZATION    . CHOLECYSTECTOMY    . EYE SURGERY    . heart failure    . LEFT HEART CATH AND CORONARY ANGIOGRAPHY N/A 08/04/2019   Procedure: LEFT HEART CATH AND CORONARY ANGIOGRAPHY;  Surgeon: Adrian Prows, MD;  Location: [facility];  Service: Cardiovascular;  Laterality: N/A;  . MASS EXCISION Right 12/27/2013   Procedure: MINOR EXCISION OF RIGHT THUMB MUCOID CYST, DEBRIDEMENT OF INTERPHALANGEAL JOINT;  Surgeon: Cammie Sickle, MD;  Location: [facility];  Service: Orthopedics;  Laterality: Right;  . mass on thumb  right  . TOTAL KNEE ARTHROPLASTY Right 02/17/2017  . TOTAL KNEE ARTHROPLASTY Right 02/17/2017   Procedure: TOTAL KNEE ARTHROPLASTY;  Surgeon: Melrose Nakayama, MD;  Location: [facility];  Service: Orthopedics;  Laterality: Right;  . TUBAL LIGATION       Current Outpatient Medications  Medication Sig Dispense Refill  . acetaminophen (TYLENOL) 500 MG tablet Take 1,000 mg by mouth  2 (two) times daily as needed (pain).    [name] albuterol (VENTOLIN HFA) 108 (90 Base) MCG/ACT inhaler INHALE 2 PUFFS INTO THE LUNGS EVERY TWO HOURS AS NEEDED FOR WHEEZING OR SHORTNESS OF BREATH. 18 g 0  . apixaban (ELIQUIS) 2.5 MG TABS tablet TAKE 1 TABLET (2.5 MG TOTAL) BY MOUTH TWO TIMES DAILY. 60 tablet 0  . aspirin 81 MG chewable tablet Chew 1 tablet (81 mg total) by mouth daily. 30 tablet 0  . carvedilol (COREG) 3.125 MG tablet Take 1 tablet (3.125 mg total) by mouth 2 (two) times daily with a meal. 60  tablet 3  . Cholecalciferol (VITAMIN D3 PO) Take 1,000 Units by mouth in the morning and at bedtime.    [name] ezetimibe (ZETIA) 10 MG tablet TAKE 1 TABLET BY MOUTH EVERY DAY 90 tablet 1  . famotidine (PEPCID) 20 MG tablet TAKE 1 TABLET BY MOUTH TWICE A DAY 180 tablet 1  . fluticasone (FLONASE) 50 MCG/ACT nasal spray Place into both nostrils daily.    . furosemide (LASIX) 20 MG tablet Take 1 tablet (20 mg total) by mouth 2 (two) times daily. 60 tablet 2  . Magnesium 400 MG TABS Take 400 mg by mouth 2 (two) times daily.     . ondansetron (ZOFRAN ODT) 4 MG disintegrating tablet Take 1 tablet (4 mg total) by mouth every 8 (eight) hours as needed for nausea or vomiting. 20 tablet 1  . polyethylene glycol powder (GLYCOLAX/MIRALAX) 17 GM/SCOOP powder DISSOLVE 1 CAPFUL IN WATER NAD TAKE BY MOUTH DAILY. 238 g 0  . sertraline (ZOLOFT) 50 MG tablet Take 50 mg by mouth at bedtime.    . vitamin B-12 (CYANOCOBALAMIN) 1000 MCG tablet Take 1,000 mcg by mouth daily.    . nitroGLYCERIN (NITROSTAT) 0.4 MG SL tablet Place 1 tablet (0.4 mg total) under the tongue every 5 (five) minutes as needed for chest pain. 30 tablet 3  . rosuvastatin (CRESTOR) 10 MG tablet TAKE 1 TABLET (10 MG TOTAL) BY MOUTH DAILY AT 6 PM. 90 tablet 1   No current facility-administered medications for this visit.    Allergies:   Shellfish-derived products, Ativan [lorazepam], Buspar [buspirone], Clarithromycin, Codeine, Doxycycline, Guaifenesin, Oxycodone, Prednisone, Statins, Sulfonamide derivatives, Tetracycline, Vicodin [hydrocodone-acetaminophen], Famotidine, Latex, Omeprazole, Peanut-containing drug products, Protonix [pantoprazole], and Wellbutrin [bupropion]   Social History:  The patient  reports that she quit smoking about 13 months ago. Her smoking use included cigarettes. She has a 50.00 pack-year smoking history. She has never used smokeless tobacco. She reports that she does not drink alcohol and does not use drugs.   Family History:   The patient's family history includes Bone cancer in her mother; Cancer in her sister; Diverticulosis in her sister; Heart disease in her mother; Hypertension in her sister; Tuberculosis in her father.    ROS:  Please see the history of present illness.   Otherwise, review of systems is positive for none.   All other systems are reviewed and negative.   PHYSICAL EXAM: VS:  BP 120/70   Pulse 85   Ht 5\' 5"  (1.651 m)   Wt 128 lb 9.6 oz (58.3 kg)   SpO2 97%   BMI 21.40 kg/m  , BMI Body mass index is 21.4 kg/m. GEN: Well nourished, well developed, in no acute distress  HEENT: normal  Neck: no JVD, carotid bruits, or masses Cardiac: RRR; no murmurs, rubs, or gallops,no edema  Respiratory:  clear to auscultation bilaterally, normal work of breathing GI: soft, nontender, nondistended, +  BS MS: no deformity or atrophy  Skin: warm and dry, device site well healed Neuro:  Strength and sensation are intact Psych: euthymic mood, full affect  EKG:  EKG is not I would like who is like to ordered today. Personal review of the ekg ordered 07/30/20 shows AV paced  Personal review of the device interrogation today. Results in [place]: 11/17/2019: NT-Pro BNP 13,138 12/07/2019: TSH 1.487 07/30/2020: ALT 11; B Natriuretic Peptide 1,856.0; Hemoglobin 11.2; Magnesium 2.5; Platelets 196 08/24/2020: BUN 27; Creatinine, Ser 3.06; Potassium 4.4; Sodium 138    Lipid Panel     Component Value Date/Time   CHOL 114 09/21/2019 1416   TRIG 147 09/21/2019 1416   HDL 33 (L) 09/21/2019 1416   CHOLHDL 7.5 08/03/2019 0249   VLDL 26 08/03/2019 0249   LDLCALC 55 09/21/2019 1416   LDLDIRECT 193.0 09/02/2016 0839     Wt Readings from Last 3 Encounters:  09/24/20 128 lb 9.6 oz (58.3 kg)  08/28/20 129 lb (58.5 kg)  08/21/20 128 lb 6.4 oz (58.2 kg)      Other studies Reviewed: Additional studies/ records that were reviewed today inc lude: TTE 08/01/2020 Review of the above records today  demonstrates:  1. Left ventricular ejection fraction, by estimation, is 25 to 30%. The  left ventricle has severely decreased function. The left ventricle  demonstrates global hypokinesis. There is moderate asymmetric left  ventricular hypertrophy of the basal-septal  segment. Left ventricular diastolic parameters are consistent with Grade I  diastolic dysfunction (impaired relaxation).  2. Right ventricular systolic function is normal. The right ventricular  size is normal. There is normal pulmonary artery systolic pressure. The  estimated right ventricular systolic pressure is 06.2 mmHg.  3. The mitral valve is degenerative. Mild mitral valve regurgitation.  4. The aortic valve was not well visualized. Aortic valve regurgitation  is not visualized. No aortic stenosis is present.  5. The inferior vena cava is normal in size with greater than 50%  respiratory variability, suggesting right atrial pressure of 3 mmHg.    ASSESSMENT AND PLAN:  1.  Chronic systolic heart failure secondary to nonischemic cardiomyopathy: Currently on carvedilol.  Kidney function and dizziness has precluded titration of any other medications.  No obvious volume overload.  Plan per primary cardiology.  2.  Torsades/VF arrest: Received is shocked by her LifeVest.  Is status post Medtronic CRT-D implanted 08/25/2019.  Device functioning appropriately.  No changes at this time.    3.  Paroxysmal atrial fibrillation: CHA2DS2-VASc of 6.  Currently on amiodarone and Eliquis.  High risk medication monitoring.  Minimal atrial fibrillation noted.  Not   Current medicines are reviewed at length with the patient today.   The patient does not have concerns regarding her medicines.  The following changes were made today: None  Labs/ tests ordered today include:  No orders of the defined types were placed in this encounter.    Disposition:   FU with Berdell Hostetler as needed months  Signed, Donesha Wallander [name], MD   09/24/2020 3:01 PM     [address] [place] [address] [phone] (office) [phone] (fax)

## 2020-09-24 NOTE — Patient Instructions (Signed)
Medication Instructions:  Your physician recommends that you continue on your current medications as directed. Please refer to the Current Medication list given to you today.  *If you need a refill on your cardiac medications before your next appointment, please call your pharmacy*   Lab Work: None ordered   Testing/Procedures: None ordered   Follow-Up: At [facility], you and your health needs are our priority.  As part of our continuing mission to provide you with exceptional heart care, we have created designated Provider Care Teams.  These Care Teams include your primary Cardiologist (physician) and Advanced Practice Providers (APPs -  Physician Assistants and Nurse Practitioners) who all work together to provide you with the care you need, when you need it.  Your next appointment:   as  needed  The format for your next appointment:   In Person  Provider:   Will Camnitz, MD    Thank you for choosing [facility]!!   Ayo Guarino, RN [phone]        

## 2020-10-01 ENCOUNTER — Ambulatory Visit: Payer: Medicare HMO | Admitting: Cardiology

## 2020-10-08 DIAGNOSIS — H52203 Unspecified astigmatism, bilateral: Secondary | ICD-10-CM | POA: Diagnosis not present

## 2020-10-08 DIAGNOSIS — H25013 Cortical age-related cataract, bilateral: Secondary | ICD-10-CM | POA: Diagnosis not present

## 2020-10-08 DIAGNOSIS — H524 Presbyopia: Secondary | ICD-10-CM | POA: Diagnosis not present

## 2020-10-08 DIAGNOSIS — H5213 Myopia, bilateral: Secondary | ICD-10-CM | POA: Diagnosis not present

## 2020-10-08 DIAGNOSIS — H2513 Age-related nuclear cataract, bilateral: Secondary | ICD-10-CM | POA: Diagnosis not present

## 2020-10-25 ENCOUNTER — Other Ambulatory Visit: Payer: Self-pay

## 2020-10-25 ENCOUNTER — Ambulatory Visit (INDEPENDENT_AMBULATORY_CARE_PROVIDER_SITE_OTHER): Payer: Medicare HMO | Admitting: Family Medicine

## 2020-10-25 ENCOUNTER — Encounter: Payer: Self-pay | Admitting: Family Medicine

## 2020-10-25 VITALS — BP 104/74 | HR 85 | Temp 97.8°F | Ht 64.0 in | Wt 131.8 lb

## 2020-10-25 DIAGNOSIS — N1832 Chronic kidney disease, stage 3b: Secondary | ICD-10-CM

## 2020-10-25 DIAGNOSIS — N39498 Other specified urinary incontinence: Secondary | ICD-10-CM | POA: Diagnosis not present

## 2020-10-25 DIAGNOSIS — N3946 Mixed incontinence: Secondary | ICD-10-CM | POA: Insufficient documentation

## 2020-10-25 DIAGNOSIS — I10 Essential (primary) hypertension: Secondary | ICD-10-CM

## 2020-10-25 DIAGNOSIS — A0472 Enterocolitis due to Clostridium difficile, not specified as recurrent: Secondary | ICD-10-CM

## 2020-10-25 DIAGNOSIS — I5020 Unspecified systolic (congestive) heart failure: Secondary | ICD-10-CM

## 2020-10-25 DIAGNOSIS — N843 Polyp of vulva: Secondary | ICD-10-CM

## 2020-10-25 DIAGNOSIS — I480 Paroxysmal atrial fibrillation: Secondary | ICD-10-CM

## 2020-10-25 NOTE — Assessment & Plan Note (Addendum)
Chronic  Euvolemic on lasix 20 mg daily today. SOB improved.  Followed by cardiology. Reviewed last OV in detail.

## 2020-10-25 NOTE — Assessment & Plan Note (Signed)
No irritation. No S/S of prolapse.  No further treatment needed.

## 2020-10-25 NOTE — Assessment & Plan Note (Signed)
Resolved. Improved appetite and energy.

## 2020-10-25 NOTE — Assessment & Plan Note (Signed)
She wears Depends.  She is not interested in trial of anticholinergic for possible OAB.

## 2020-10-25 NOTE — Progress Notes (Signed)
Patient ID: [name], female    DOB: 1950/06/21, 70 y.o.   MRN: [id number]  This visit was conducted in person.  BP 104/74   Pulse 85   Temp 97.8 F (36.6 C) (Temporal)   Ht 5\' 4"  (1.626 m)   Wt 131 lb 12 oz (59.8 kg)   SpO2 98%   BMI 22.61 kg/m    CC:  Chief Complaint  Patient presents with  . Follow-up    Multiple Issues    Subjective:   HPI: [name] is a 70 y.o. female presenting on 10/25/2020 for Follow-up (Multiple Issues)   C difficile:  Resolved. Appetite has improved. Has gained few pounds.   Wt Readings from Last 3 Encounters:  10/25/20 131 lb 12 oz (59.8 kg)  09/24/20 128 lb 9.6 oz (58.3 kg)  08/28/20 129 lb (58.5 kg)    Hypertension:   Good control.  BP Readings from Last 3 Encounters:  10/25/20 104/74  09/24/20 120/70  08/28/20 (!) 116/52  Using medication without problems or lightheadedness:  none Chest pain with exertion: none Edema:none Short of breath: improved Average home BPs: Other issues: acute on CHF.. followed by cardiology on  furosemide  20 mg daily. Dr. Terri Skains   Afib, arrhythmia history .. followed by Dr. Curt Bears.  She is concerned her bladder may have dropped... she has noted  urinary incontinence... wear depends all the time  She feels vaginal pressure, more prominent lately in vaginal area in last few weeks.  Has been picking up great grand baby lately. Mild vaginal irritation. No dysuria.  No frequent UTI.  No constipation. Looses control of bladder when gets ice every morning, does urinate frequently but is on lasix. Some nocturia but better than previously.     Relevant past medical, surgical, family and social history reviewed and updated as indicated. Interim medical history since our last visit reviewed. Allergies and medications reviewed and updated. Outpatient Medications Prior to Visit  Medication Sig Dispense Refill  . acetaminophen (TYLENOL) 500 MG tablet Take 1,000 mg by mouth 2 (two) times daily as  needed (pain).    [name] albuterol (VENTOLIN HFA) 108 (90 Base) MCG/ACT inhaler INHALE 2 PUFFS INTO THE LUNGS EVERY TWO HOURS AS NEEDED FOR WHEEZING OR SHORTNESS OF BREATH. 18 g 0  . apixaban (ELIQUIS) 2.5 MG TABS tablet TAKE 1 TABLET (2.5 MG TOTAL) BY MOUTH TWO TIMES DAILY. 60 tablet 0  . aspirin 81 MG chewable tablet Chew 1 tablet (81 mg total) by mouth daily. 30 tablet 0  . carvedilol (COREG) 3.125 MG tablet Take 1 tablet (3.125 mg total) by mouth 2 (two) times daily with a meal. 60 tablet 3  . Cholecalciferol (VITAMIN D3 PO) Take 1,000 Units by mouth in the morning and at bedtime.    [name] ezetimibe (ZETIA) 10 MG tablet TAKE 1 TABLET BY MOUTH EVERY DAY 90 tablet 1  . famotidine (PEPCID) 20 MG tablet TAKE 1 TABLET BY MOUTH TWICE A DAY 180 tablet 1  . fluticasone (FLONASE) 50 MCG/ACT nasal spray Place into both nostrils daily.    . Magnesium 400 MG TABS Take 400 mg by mouth 2 (two) times daily.     . ondansetron (ZOFRAN ODT) 4 MG disintegrating tablet Take 1 tablet (4 mg total) by mouth every 8 (eight) hours as needed for nausea or vomiting. 20 tablet 1  . polyethylene glycol powder (GLYCOLAX/MIRALAX) 17 GM/SCOOP powder DISSOLVE 1 CAPFUL IN WATER NAD TAKE BY MOUTH DAILY. 238 g 0  .  sertraline (ZOLOFT) 50 MG tablet Take 50 mg by mouth at bedtime.    . vitamin B-12 (CYANOCOBALAMIN) 1000 MCG tablet Take 1,000 mcg by mouth daily.    . furosemide (LASIX) 20 MG tablet Take 1 tablet (20 mg total) by mouth 2 (two) times daily. 60 tablet 2  . nitroGLYCERIN (NITROSTAT) 0.4 MG SL tablet Place 1 tablet (0.4 mg total) under the tongue every 5 (five) minutes as needed for chest pain. 30 tablet 3  . rosuvastatin (CRESTOR) 10 MG tablet TAKE 1 TABLET (10 MG TOTAL) BY MOUTH DAILY AT 6 PM. 90 tablet 1   No facility-administered medications prior to visit.     Per HPI unless specifically indicated in ROS section below Review of Systems  Constitutional: Negative for fatigue and fever.  HENT: Negative for congestion.    Eyes: Negative for pain.  Respiratory: Negative for cough and shortness of breath.   Cardiovascular: Negative for chest pain, palpitations and leg swelling.  Gastrointestinal: Negative for abdominal pain.  Genitourinary: Negative for dysuria and vaginal bleeding.  Musculoskeletal: Negative for back pain.  Neurological: Negative for syncope, light-headedness and headaches.  Psychiatric/Behavioral: Negative for dysphoric mood.   Objective:  BP 104/74   Pulse 85   Temp 97.8 F (36.6 C) (Temporal)   Ht 5\' 4"  (1.626 m)   Wt 131 lb 12 oz (59.8 kg)   SpO2 98%   BMI 22.61 kg/m   Wt Readings from Last 3 Encounters:  10/25/20 131 lb 12 oz (59.8 kg)  09/24/20 128 lb 9.6 oz (58.3 kg)  08/28/20 129 lb (58.5 kg)      Physical Exam Exam conducted with a chaperone present.  Constitutional:      General: She is not in acute distress.    Appearance: Normal appearance. She is well-developed. She is not ill-appearing or toxic-appearing.  HENT:     Head: Normocephalic.     Right Ear: Hearing, tympanic membrane, ear canal and external ear normal. Tympanic membrane is not erythematous, retracted or bulging.     Left Ear: Hearing, tympanic membrane, ear canal and external ear normal. Tympanic membrane is not erythematous, retracted or bulging.     Nose: No mucosal edema or rhinorrhea.     Right Sinus: No maxillary sinus tenderness or frontal sinus tenderness.     Left Sinus: No maxillary sinus tenderness or frontal sinus tenderness.     Mouth/Throat:     Pharynx: Uvula midline.  Eyes:     General: Lids are normal. Lids are everted, no foreign bodies appreciated.     Conjunctiva/sclera: Conjunctivae normal.     Pupils: Pupils are equal, round, and reactive to light.  Neck:     Thyroid: No thyroid mass or thyromegaly.     Vascular: No carotid bruit.     Trachea: Trachea normal.  Cardiovascular:     Rate and Rhythm: Normal rate and regular rhythm.     Pulses: Normal pulses.     Heart sounds:  Normal heart sounds, S1 normal and S2 normal. No murmur heard. No friction rub. No gallop.   Pulmonary:     Effort: Pulmonary effort is normal. No tachypnea or respiratory distress.     Breath sounds: Normal breath sounds. No decreased breath sounds, wheezing, rhonchi or rales.  Abdominal:     General: Bowel sounds are normal.     Palpations: Abdomen is soft.     Tenderness: There is no abdominal tenderness.     Hernia: There is no hernia in  the left inguinal area or right inguinal area.  Genitourinary:    Exam position: Supine.     Pubic Area: No rash.      Labia:        Right: No lesion.        Left: No lesion.      Vagina: Lesions present.     Comments: Vaginal/labial tag/polyp noted  no prolapse, minimal cystocele Musculoskeletal:     Cervical back: Normal range of motion and neck supple.  Skin:    General: Skin is warm and dry.     Findings: No rash.  Neurological:     Mental Status: She is alert.  Psychiatric:        Mood and Affect: Mood is not anxious or depressed.        Speech: Speech normal.        Behavior: Behavior normal. Behavior is cooperative.        Thought Content: Thought content normal.        Judgment: Judgment normal.       Results for orders placed or performed in visit on 48/01/65  Basic metabolic panel  Result Value Ref Range   Sodium 138 135 - 145 mEq/L   Potassium 4.4 3.5 - 5.1 mEq/L   Chloride 102 96 - 112 mEq/L   CO2 28 19 - 32 mEq/L   Glucose, Bld 104 (H) 70 - 99 mg/dL   BUN 27 (H) 6 - 23 mg/dL   Creatinine, Ser 3.06 (H) 0.40 - 1.20 mg/dL   GFR 14.96 (LL) >60.00 mL/min   Calcium 9.3 8.4 - 10.5 mg/dL    This visit occurred during the SARS-CoV-2 public health emergency.  Safety protocols were in place, including screening questions prior to the visit, additional usage of staff PPE, and extensive cleaning of exam room while observing appropriate contact time as indicated for disinfecting solutions.   COVID 19 screen:  No recent travel or  known exposure to COVID19 The patient denies respiratory symptoms of COVID 19 at this time. The importance of social distancing was discussed today.   Assessment and Plan Problem List Items Addressed This Visit    CKD (chronic kidney disease) stage 3, GFR 30-59 ml/min (HCC)   Relevant Orders   Basic Metabolic Panel   Colitis, Clostridium difficile    Resolved. Improved appetite and energy.      Essential hypertension    Stable, chronic.  Continue current medication.   Coreg 3.125 mg BID       HFrEF (heart failure with reduced ejection fraction) (HCC)     Chronic  Euvolemic on lasix 20 mg daily today. SOB improved.  Followed by cardiology. Reviewed last OV in detail.      Mixed incontinence urge and stress   Paroxysmal atrial fibrillation ([place])    Per Dr. Curt Bears EP.[name] last note reviewed. Currently on  Eliquis.  High risk medication monitoring.  Minimal atrial fibrillation noted.      Vulvar/labial polyp    No irritation. No S/S of prolapse.  No further treatment needed.       Other Visit Diagnoses    Other urinary incontinence    -  Primary     Orders Placed This Encounter  Procedures  . Basic Metabolic Panel        [name], MD

## 2020-10-25 NOTE — Patient Instructions (Signed)
Can do Kegel exercises.  Use Depends for control. Call if urinary frequency or urgency becoming bothersome.

## 2020-10-25 NOTE — Assessment & Plan Note (Signed)
Stable, chronic.  Continue current medication.   Coreg 3.125 mg BID

## 2020-10-25 NOTE — Assessment & Plan Note (Signed)
Per Dr. Curt Bears EP.[name] last note reviewed. Currently on  Eliquis.  High risk medication monitoring.  Minimal atrial fibrillation noted.

## 2020-10-26 LAB — BASIC METABOLIC PANEL
BUN: 29 mg/dL — ABNORMAL HIGH (ref 6–23)
CO2: 28 mEq/L (ref 19–32)
Calcium: 9.1 mg/dL (ref 8.4–10.5)
Chloride: 104 mEq/L (ref 96–112)
Creatinine, Ser: 2.6 mg/dL — ABNORMAL HIGH (ref 0.40–1.20)
GFR: 18.16 mL/min — ABNORMAL LOW (ref >60.00)
Glucose, Bld: 89 mg/dL (ref 70–99)
Potassium: 4.3 mEq/L (ref 3.5–5.1)
Sodium: 142 mEq/L (ref 135–145)

## 2020-11-05 ENCOUNTER — Other Ambulatory Visit: Payer: Self-pay

## 2020-11-05 MED ORDER — APIXABAN 2.5 MG PO TABS: ORAL_TABLET | Freq: Two times a day (BID) | ORAL | 1 refills | Status: DC

## 2020-11-07 DIAGNOSIS — I509 Heart failure, unspecified: Secondary | ICD-10-CM | POA: Diagnosis not present

## 2020-11-07 DIAGNOSIS — N184 Chronic kidney disease, stage 4 (severe): Secondary | ICD-10-CM | POA: Diagnosis not present

## 2020-11-07 DIAGNOSIS — I129 Hypertensive chronic kidney disease with stage 1 through stage 4 chronic kidney disease, or unspecified chronic kidney disease: Secondary | ICD-10-CM | POA: Diagnosis not present

## 2020-11-23 DIAGNOSIS — I5022 Chronic systolic (congestive) heart failure: Secondary | ICD-10-CM | POA: Diagnosis not present

## 2020-11-23 DIAGNOSIS — Z4502 Encounter for adjustment and management of automatic implantable cardiac defibrillator: Secondary | ICD-10-CM | POA: Diagnosis not present

## 2020-11-23 DIAGNOSIS — Z95810 Presence of automatic (implantable) cardiac defibrillator: Secondary | ICD-10-CM | POA: Diagnosis not present

## 2020-11-23 DIAGNOSIS — I4901 Ventricular fibrillation: Secondary | ICD-10-CM | POA: Diagnosis not present

## 2020-11-24 DIAGNOSIS — I4901 Ventricular fibrillation: Secondary | ICD-10-CM | POA: Diagnosis not present

## 2020-11-24 DIAGNOSIS — Z4502 Encounter for adjustment and management of automatic implantable cardiac defibrillator: Secondary | ICD-10-CM | POA: Diagnosis not present

## 2020-11-24 DIAGNOSIS — I5022 Chronic systolic (congestive) heart failure: Secondary | ICD-10-CM | POA: Diagnosis not present

## 2020-11-24 DIAGNOSIS — Z95810 Presence of automatic (implantable) cardiac defibrillator: Secondary | ICD-10-CM | POA: Diagnosis not present

## 2020-11-27 ENCOUNTER — Ambulatory Visit: Payer: Medicare HMO | Admitting: Cardiology

## 2020-11-28 ENCOUNTER — Encounter (INDEPENDENT_AMBULATORY_CARE_PROVIDER_SITE_OTHER): Payer: Medicare HMO | Admitting: Ophthalmology

## 2020-11-29 ENCOUNTER — Other Ambulatory Visit: Payer: Self-pay | Admitting: Cardiology

## 2020-11-30 ENCOUNTER — Other Ambulatory Visit: Payer: Self-pay | Admitting: Cardiology

## 2020-12-06 ENCOUNTER — Other Ambulatory Visit: Payer: Self-pay

## 2020-12-06 ENCOUNTER — Encounter: Payer: Self-pay | Admitting: Cardiology

## 2020-12-06 ENCOUNTER — Ambulatory Visit: Payer: Medicare HMO | Admitting: Cardiology

## 2020-12-06 VITALS — BP 140/71 | HR 86 | Resp 16 | Ht 64.0 in | Wt 136.0 lb

## 2020-12-06 DIAGNOSIS — Z95810 Presence of automatic (implantable) cardiac defibrillator: Secondary | ICD-10-CM | POA: Diagnosis not present

## 2020-12-06 DIAGNOSIS — I469 Cardiac arrest, cause unspecified: Secondary | ICD-10-CM

## 2020-12-06 DIAGNOSIS — Z87891 Personal history of nicotine dependence: Secondary | ICD-10-CM

## 2020-12-06 DIAGNOSIS — Z8673 Personal history of transient ischemic attack (TIA), and cerebral infarction without residual deficits: Secondary | ICD-10-CM

## 2020-12-06 DIAGNOSIS — I429 Cardiomyopathy, unspecified: Secondary | ICD-10-CM | POA: Diagnosis not present

## 2020-12-06 DIAGNOSIS — Z8679 Personal history of other diseases of the circulatory system: Secondary | ICD-10-CM

## 2020-12-06 DIAGNOSIS — I10 Essential (primary) hypertension: Secondary | ICD-10-CM

## 2020-12-06 DIAGNOSIS — I4901 Ventricular fibrillation: Secondary | ICD-10-CM

## 2020-12-06 DIAGNOSIS — I447 Left bundle-branch block, unspecified: Secondary | ICD-10-CM

## 2020-12-06 DIAGNOSIS — E782 Mixed hyperlipidemia: Secondary | ICD-10-CM

## 2020-12-06 DIAGNOSIS — I5022 Chronic systolic (congestive) heart failure: Secondary | ICD-10-CM | POA: Diagnosis not present

## 2020-12-06 DIAGNOSIS — Z7901 Long term (current) use of anticoagulants: Secondary | ICD-10-CM

## 2020-12-06 DIAGNOSIS — I480 Paroxysmal atrial fibrillation: Secondary | ICD-10-CM

## 2020-12-06 NOTE — Progress Notes (Signed)
[name] Date of Birth: 09-30-1950 MRN: [id number] Primary Care Provider:Bedsole, Mervyn Gay, MD Primary Cardiologist: Rex Kras, DO Electrophysiologist: Dr. Reggy Eye   Date: 12/06/20 Last Office Visit: 08/28/2020   Chief Complaint  Patient presents with   heart failure with reduced ejection fraction   Follow-up   HPI  [name] is a 70 y.o. female who presents to the office with a chief complaint of "management of  heart failure." Patient's past medical history and cardiac risk factors include: Hypertension, hyperlipidemia, former smoker, history of Arnold-Chiari malformation, history of VT/VF, history of torsades, cardiac arrest, nonischemic cardiomyopathy, peripheral vascular disease, paroxysmal atrial fibrillation, left bundle branch block, history of CVA.  Patient is accompanied by [name] at today's office visit.   Since last office visit patient states that she is doing well from a cardiovascular standpoint.  No hospitalizations or urgent care visits for congestive heart failure, ACS symptoms, or torsades/defibrillation.  Patient renal function is slowly improving compared to before.  Most recent labs from 10/25/2020 reviewed.  Patient's weight has slightly increased most likely due to caloric intake.  Overall is euvolemic on physical examination.  Compared to prior office visit patient is in much better mood and feels less depressed.  ALLERGIES: Allergies  Allergen Reactions   Shellfish-Derived Products Anaphylaxis   Ativan [Lorazepam] Other (See Comments)    Makes "skin crawl" and insomnia   Buspar [Buspirone] Nausea Only    Sweating and dizzy   Clarithromycin Swelling   Codeine Nausea And Vomiting   Doxycycline Other (See Comments)    Unknown   Guaifenesin Other (See Comments)    Palpitations   Oxycodone Nausea And Vomiting   Prednisone Nausea And Vomiting and Other (See Comments)    Makes my heart race   Statins Other (See Comments)     Muscle pain   Sulfonamide Derivatives Nausea And Vomiting    Achiness   Tetracycline Nausea Only   Vicodin [Hydrocodone-Acetaminophen] Nausea And Vomiting   Famotidine Rash   Latex Rash   Omeprazole Nausea Only    Cough, shortness of breath - patient doesn't remember   Peanut-Containing Drug Products Itching and Rash   Protonix [Pantoprazole] Rash   Wellbutrin [Bupropion] Palpitations   MEDICATION LIST PRIOR TO VISIT: Current Outpatient Medications on File Prior to Visit  Medication Sig Dispense Refill   acetaminophen (TYLENOL) 500 MG tablet Take 1,000 mg by mouth 2 (two) times daily as needed (pain).     albuterol (VENTOLIN HFA) 108 (90 Base) MCG/ACT inhaler INHALE 2 PUFFS INTO THE LUNGS EVERY TWO HOURS AS NEEDED FOR WHEEZING OR SHORTNESS OF BREATH. 18 g 0   apixaban (ELIQUIS) 2.5 MG TABS tablet TAKE 1 TABLET (2.5 MG TOTAL) BY MOUTH TWO TIMES DAILY. 180 tablet 1   aspirin 81 MG chewable tablet Chew 1 tablet (81 mg total) by mouth daily. 30 tablet 0   carvedilol (COREG) 3.125 MG tablet TAKE 1 TABLET (3.125 MG TOTAL) BY MOUTH 2 (TWO) TIMES DAILY WITH A MEAL. 180 tablet 1   Cholecalciferol (VITAMIN D3 PO) Take 1,000 Units by mouth in the morning and at bedtime.     ezetimibe (ZETIA) 10 MG tablet TAKE 1 TABLET BY MOUTH EVERY DAY 90 tablet 1   famotidine (PEPCID) 20 MG tablet TAKE 1 TABLET BY MOUTH TWICE A DAY 180 tablet 1   fluticasone (FLONASE) 50 MCG/ACT nasal spray Place into both nostrils daily.     furosemide (LASIX) 20 MG tablet TAKE 1 TABLET BY MOUTH  TWICE A DAY (Patient taking differently: Take 20 mg by mouth daily.) 180 tablet 1   Magnesium 400 MG TABS Take 400 mg by mouth 2 (two) times daily.      nitroGLYCERIN (NITROSTAT) 0.4 MG SL tablet Place 1 tablet (0.4 mg total) under the tongue every 5 (five) minutes as needed for chest pain. 30 tablet 3   ondansetron (ZOFRAN ODT) 4 MG disintegrating tablet Take 1 tablet (4 mg total) by mouth every 8 (eight) hours as needed for nausea  or vomiting. 20 tablet 1   polyethylene glycol powder (GLYCOLAX/MIRALAX) 17 GM/SCOOP powder DISSOLVE 1 CAPFUL IN WATER NAD TAKE BY MOUTH DAILY. 238 g 0   rosuvastatin (CRESTOR) 10 MG tablet TAKE 1 TABLET (10 MG TOTAL) BY MOUTH DAILY AT 6 PM. 90 tablet 1   sertraline (ZOLOFT) 50 MG tablet Take 50 mg by mouth at bedtime.     vitamin B-12 (CYANOCOBALAMIN) 1000 MCG tablet Take 1,000 mcg by mouth daily.     No current facility-administered medications on file prior to visit.    PAST MEDICAL HISTORY: Past Medical History:  Diagnosis Date   Allergy    Anxiety    Arnold-Chiari malformation (HCC)    Arthritis    CHF (congestive heart failure) ([place])    Chronic systolic heart failure ([place]) 11/03/2019   Coronary artery disease    Dyspnea    Encounter for assessment of implantable cardioverter-defibrillator (ICD) 11/10/2019   GERD (gastroesophageal reflux disease)    H. pylori infection    3 years ago   H/O cardiac arrest    Hx of VT/Vfib arrest   Hyperlipidemia    ICD BiV Medtronic model [id number] Claria Quad CRT-D SureScan ICD 08/25/2019    Incontinence    Paroxysmal atrial fibrillation ([place]) 08/06/2019   Pulmonary edema 2021   Syncope and collapse    Per pt, denies passing out   Tobacco use disorder    Unspecified essential hypertension     PAST SURGICAL HISTORY: Past Surgical History:  Procedure Laterality Date   APPENDECTOMY     ARNOLD CHIARI SURGERY     neurocranial surgery   BIV ICD INSERTION CRT-D N/A 08/25/2019   Procedure: BIV ICD INSERTION CRT-D;  Surgeon: Constance Haw, MD;  Location: [facility];  Service: Cardiovascular;  Laterality: N/A;   BLEPHAROPLASTY     Bil   C-EYE SURGERY PROCEDURE     CARDIAC CATHETERIZATION     CHOLECYSTECTOMY     EYE SURGERY     heart failure     LEFT HEART CATH AND CORONARY ANGIOGRAPHY N/A 08/04/2019   Procedure: LEFT HEART CATH AND CORONARY ANGIOGRAPHY;  Surgeon: Adrian Prows, MD;  Location: [facility];  Service:  Cardiovascular;  Laterality: N/A;   MASS EXCISION Right 12/27/2013   Procedure: MINOR EXCISION OF RIGHT THUMB MUCOID CYST, DEBRIDEMENT OF INTERPHALANGEAL JOINT;  Surgeon: Cammie Sickle, MD;  Location: [facility];  Service: Orthopedics;  Laterality: Right;   mass on thumb  right   TOTAL KNEE ARTHROPLASTY Right 02/17/2017   TOTAL KNEE ARTHROPLASTY Right 02/17/2017   Procedure: TOTAL KNEE ARTHROPLASTY;  Surgeon: Melrose Nakayama, MD;  Location: [facility];  Service: Orthopedics;  Laterality: Right;   TUBAL LIGATION      FAMILY HISTORY: The patient family history includes Bone cancer in her mother; Cancer in her sister; Diverticulosis in her sister; Heart disease in her mother; Hypertension in her sister; Tuberculosis in her father.   SOCIAL HISTORY:  The patient  reports that she quit smoking about 15 months ago. Her smoking use included cigarettes. She has a 50.00 pack-year smoking history. She has never used smokeless tobacco. She reports that she does not drink alcohol and does not use drugs.  Review of Systems  Constitutional: Negative for chills, fever and weight loss.  HENT:  Negative for hoarse voice and nosebleeds.   Eyes:  Negative for discharge, double vision and pain.  Cardiovascular:  Negative for chest pain, claudication, dyspnea on exertion, leg swelling, near-syncope, orthopnea, palpitations, paroxysmal nocturnal dyspnea and syncope.  Respiratory:  Negative for hemoptysis and shortness of breath.   Musculoskeletal:  Negative for muscle cramps and myalgias.  Gastrointestinal:  Negative for abdominal pain, constipation, diarrhea, hematemesis, hematochezia, melena, nausea and vomiting.  Neurological:  Negative for dizziness and light-headedness.   PHYSICAL EXAM: Vitals with BMI 12/06/2020 10/25/2020 09/24/2020  Height 5\' 4"  5\' 4"  5\' 5"   Weight 136 lbs 131 lbs 12 oz 128 lbs 10 oz  BMI 23.33 67.1 24.5  Systolic 809 983 382  Diastolic 71 74 70  Pulse 86 85 85    CONSTITUTIONAL: Appears older than stated age, hemodynamically stable, no acute distress.    SKIN: Skin is warm and dry. No rash noted. No cyanosis. No pallor. No jaundice HEAD: Normocephalic and atraumatic.  EYES: No scleral icterus MOUTH/THROAT: Moist oral membranes.  NECK: No JVD present. No thyromegaly noted.   LYMPHATIC: No visible cervical adenopathy.  CHEST Normal respiratory effort. No intercostal retractions.  BiV ICD noted left infraclavicular region. LUNGS: Clear to auscultation bilaterally.  No stridor. No wheezes. No rales.  CARDIOVASCULAR: Regular, positive N0-N3, soft holosystolic murmur heard at the apex radiating to axilla, no gallops or rubs appreciated ABDOMINAL: Nonobese, soft, nontender, nondistended, positive bowel sounds in all 4 quadrants no apparent ascites.  EXTREMITIES: No bilateral peripheral edema. 2+ right femoral pulse, 1+ left femoral pulse, none palpable bilateral popliteal, dorsalis pedis or posterior tibial pulses.  Warm to touch bilaterally.  No discoloration or cyanosis present. HEMATOLOGIC: No significant bruising NEUROLOGIC: Oriented to person, place, and time. Nonfocal. Normal muscle tone.  PSYCHIATRIC: Normal mood and affect. Normal behavior. Cooperative  CARDIAC DATABASE: EKG: 08/12/2019: Normal sinus rhythm with ventricular rate of 61 bpm, left axis deviation, left bundle branch block, nonspecific T wave abnormalities.  Prior EKG dated 08/04/2019 shows a normal sinus rhythm, left axis deviation, left bundle branch block. 12/08/2019: Atrial paced,  Ventricular sensed rhythm. 12/06/2020: Presence of dual-chamber pacemaker, 71 bpm.  Echocardiogram: 08/01/2020:   1. Left ventricular ejection fraction, by estimation, is 25 to 30%. The left ventricle has severely decreased function. The left ventricle demonstrates global hypokinesis. There is moderate asymmetric left ventricular hypertrophy of the basal-septal segment. Left ventricular diastolic parameters  are consistent with Grade I diastolic dysfunction (impaired relaxation).   2. Right ventricular systolic function is normal. The right ventricular size is normal. There is normal pulmonary artery systolic pressure. The estimated right ventricular systolic pressure is 97.6 mmHg.   3. The mitral valve is degenerative. Mild mitral valve regurgitation.   4. The aortic valve was not well visualized. Aortic valve regurgitation is not visualized. No aortic stenosis is present.   5. The inferior vena cava is normal in size with greater than 50% respiratory variability, suggesting right atrial pressure of 3 mmHg.   Heart Catheterization: 08/04/19:  LV: Global hypokinesis, upper limit of normal size, EF 25 to 30%. Left main: Normal. LAD: Mild diffuse disease.  Proximal LAD has a 30  to 40% stenosis, mid segment has a 20 to 30% stenosis, scattered disease noted in the LAD.  Brisk flow. Circumflex: Again scattered disease noted in the circumflex.  Mid segment has at most a 30 to 40% stenosis which appears to be eccentric and calcified. RCA: Dominant.  Mild diffuse disease again noted.  Mid segment after the origin of RV branch has a 70 to 80% stenosis.  Brisk flow is evident throughout the RCA.  Impression: Findings consistent with nonischemic cardiomyopathy.  Although she has significant disease in the right coronary artery, this does not explain her presentation with global hypokinesis, neither does it explain VF arrest as the lesion does not appear to be unstable.   Biventricular ICD implantation 08/25/2019:  ICD BiV Medtronic model [id number] Claria Quad CRT-D SureScan ICD: Remote BiV ICD transmission 11/23/2020: Longevity 7 years and 8 months. Lead sensing and thresholds within normal limits. AP 98%, BP 98.31%. There were no AMS episodes.  No high ventricular rate episodes. Thoracic impedance is at baseline.  Normal ICD function.  Carotid duplex: 08/03/2019: Right Carotid: Velocities in the right ICA  are consistent with a 1-39% stenosis. Left Carotid: Velocities in the left ICA are consistent with a 1-39%  stenosis. Vertebrals:  Bilateral vertebral arteries demonstrate antegrade flow. Subclavians: Normal flow hemodynamics were seen in bilateral subclavian  arteries.  LABORATORY DATA: CBC Latest Ref Rng & Units 07/30/2020 06/12/2020 04/23/2020  WBC 4.0 - 10.5 K/uL 6.0 6.0 5.4  Hemoglobin 12.0 - 15.0 g/dL 11.2(L) 10.6(L) 10.7(L)  Hematocrit 36.0 - 46.0 % 34.5(L) 32.4(L) 32.8(L)  Platelets 150 - 400 K/uL 196 173 223.0    CMP Latest Ref Rng & Units 10/25/2020 08/24/2020 08/16/2020  Glucose 70 - 99 mg/dL 89 104(H) 84  BUN 6 - 23 mg/dL 29(H) 27(H) 39(H)  Creatinine 0.40 - 1.20 mg/dL 2.60(H) 3.06(H) 3.57(H)  Sodium 135 - 145 mEq/L 142 138 136  Potassium 3.5 - 5.1 mEq/L 4.3 4.4 4.9  Chloride 96 - 112 mEq/L 104 102 100  CO2 19 - 32 mEq/L 28 28 28   Calcium 8.4 - 10.5 mg/dL 9.1 9.3 9.6  Total Protein 6.5 - 8.1 g/dL - - -  Total Bilirubin 0.3 - 1.2 mg/dL - - -  Alkaline Phos 38 - 126 U/L - - -  AST 15 - 41 U/L - - -  ALT 0 - 44 U/L - - -    Lipid Panel     Component Value Date/Time   CHOL 114 09/21/2019 1416   TRIG 147 09/21/2019 1416   HDL 33 (L) 09/21/2019 1416   CHOLHDL 7.5 08/03/2019 0249   VLDL 26 08/03/2019 0249   LDLCALC 55 09/21/2019 1416   LDLDIRECT 193.0 09/02/2016 0839   LABVLDL 26 09/21/2019 1416    Lab Results  Component Value Date   HGBA1C 5.7 (H) 08/03/2019   No components found for: NTPROBNP Lab Results  Component Value Date   TSH 1.487 12/07/2019   TSH 0.550 08/24/2019   TSH 0.75 08/23/2019    FINAL MEDICATION LIST END OF ENCOUNTER:   Current Outpatient Medications:    acetaminophen (TYLENOL) 500 MG tablet, Take 1,000 mg by mouth 2 (two) times daily as needed (pain)., Disp: , Rfl:    albuterol (VENTOLIN HFA) 108 (90 Base) MCG/ACT inhaler, INHALE 2 PUFFS INTO THE LUNGS EVERY TWO HOURS AS NEEDED FOR WHEEZING OR SHORTNESS OF BREATH., Disp: 18 g, Rfl: 0    apixaban (ELIQUIS) 2.5 MG TABS tablet, TAKE 1 TABLET (2.5 MG TOTAL) BY MOUTH  TWO TIMES DAILY., Disp: 180 tablet, Rfl: 1   aspirin 81 MG chewable tablet, Chew 1 tablet (81 mg total) by mouth daily., Disp: 30 tablet, Rfl: 0   carvedilol (COREG) 3.125 MG tablet, TAKE 1 TABLET (3.125 MG TOTAL) BY MOUTH 2 (TWO) TIMES DAILY WITH A MEAL., Disp: 180 tablet, Rfl: 1   Cholecalciferol (VITAMIN D3 PO), Take 1,000 Units by mouth in the morning and at bedtime., Disp: , Rfl:    ezetimibe (ZETIA) 10 MG tablet, TAKE 1 TABLET BY MOUTH EVERY DAY, Disp: 90 tablet, Rfl: 1   famotidine (PEPCID) 20 MG tablet, TAKE 1 TABLET BY MOUTH TWICE A DAY, Disp: 180 tablet, Rfl: 1   fluticasone (FLONASE) 50 MCG/ACT nasal spray, Place into both nostrils daily., Disp: , Rfl:    furosemide (LASIX) 20 MG tablet, TAKE 1 TABLET BY MOUTH TWICE A DAY (Patient taking differently: Take 20 mg by mouth daily.), Disp: 180 tablet, Rfl: 1   Magnesium 400 MG TABS, Take 400 mg by mouth 2 (two) times daily. , Disp: , Rfl:    nitroGLYCERIN (NITROSTAT) 0.4 MG SL tablet, Place 1 tablet (0.4 mg total) under the tongue every 5 (five) minutes as needed for chest pain., Disp: 30 tablet, Rfl: 3   ondansetron (ZOFRAN ODT) 4 MG disintegrating tablet, Take 1 tablet (4 mg total) by mouth every 8 (eight) hours as needed for nausea or vomiting., Disp: 20 tablet, Rfl: 1   polyethylene glycol powder (GLYCOLAX/MIRALAX) 17 GM/SCOOP powder, DISSOLVE 1 CAPFUL IN WATER NAD TAKE BY MOUTH DAILY., Disp: 238 g, Rfl: 0   rosuvastatin (CRESTOR) 10 MG tablet, TAKE 1 TABLET (10 MG TOTAL) BY MOUTH DAILY AT 6 PM., Disp: 90 tablet, Rfl: 1   sertraline (ZOLOFT) 50 MG tablet, Take 50 mg by mouth at bedtime., Disp: , Rfl:    vitamin B-12 (CYANOCOBALAMIN) 1000 MCG tablet, Take 1,000 mcg by mouth daily., Disp: , Rfl:   IMPRESSION:    ICD-10-CM   1. Chronic HFrEF (heart failure with reduced ejection fraction) (HCC)  I50.22 EKG 12-Lead    2. Cardiomyopathy  I42.9     3. Essential  hypertension  I10     4. Paroxysmal atrial fibrillation (HCC)  I48.0     5. Long term current use of anticoagulant  Z79.01     6. Cardiac arrest with ventricular fibrillation (HCC)  I46.9    I49.01     7. Biventricular ICD (implantable cardioverter-defibrillator) in place  Z95.810     8. History of torsades de pointes  Z86.79     9. Left bundle branch block  I44.7     10. History of CVA (cerebrovascular accident)  Z78.73     11. Mixed hyperlipidemia  E78.2     12. Former smoker  Z87.891        RECOMMENDATIONS: THEA HOLSHOUSER is a 70 y.o. female whose past medical history and cardiac risk factors include: Hypertension, hyperlipidemia, former smoker, history of Arnold-Chiari malformation, history of VT/VF, history of torsades, cardiac arrest, nonischemic cardiomyopathy, peripheral vascular disease, paroxysmal atrial fibrillation, left bundle branch block, history of CVA.  Chronic heart failure with reduced ejection fraction, stage C, NYHA class II: Euvolemic Recent hospitalizations include December 2021, February 2022 Continue current medical therapy. Unable to be on ARB's, Arni, Aldactone secondary to progressive CKD. She was also on hydralazine/Isordil combination but did not able to tolerate it (felt tired, fatigued). Continue Lasix as prescribed.  Diuretics managed by nephrology given her chronic kidney disease stage IV  Strict I's and O's, and daily weights recommended. Fluid restriction to less than 2 L/day, sodium restriction to less than 1.5 g/day.  Cardiomyopathy, suggestive of nonischemic: See above  Status post BiV ICD status post ventricular fibrillation arrest: Medtronic model [id number] Claria Quad CRT-D SureScan (serial Number [id number] )  Last device check from 11/2020 reviewed with patient and daughter.   Left bundle branch block: Continue to monitor.  Paroxysmal atrial fibrillation: Rate control: Coreg.   Rhythm control: None. Thromboembolic prophylaxis  currently on Eliquis. Patient does not endorse any evidence of bleeding. CHA2DS2VASc score: 7. Annual stroke risk: 9.6% (congestive heart failure, hypertension, age, history of stroke, medical history of peripheral vascular disease, and gender)  Long-term oral anticoagulation use: Indication paroxysmal atrial fibrillation.  Patient did not endorse any evidence of bleeding Patient has gained weight since last office visit and recommended transitioning back to Eliquis 5 mg p.o. twice daily.  However, the shared decision was if her weight continues to trend up and once she is 143 pounds or closer to 65 kg patient's daughter will call the office and we will transition her to 5 mg p.o. twice daily of Eliquis.  Mixed hyperlipidemia:  Currently on Crestor and Zetia.  We will continue to monitor.  Patient does not endorse any evidence of myalgias.  History of CVA: Educated on the importance of secondary prevention.  Will defer further management to primary and neurology.  Former smoker: Educated on the importance of continued smoking cessation.  Orders Placed This Encounter  Procedures   EKG 12-Lead    --Continue cardiac medications as reconciled in final medication list. --Return in about 6 months (around 06/07/2021) for Follow up, heart failure management... Or sooner if needed. --Continue follow-up with your primary care physician regarding the management of your other chronic comorbid conditions.  Patient's questions and concerns were addressed to her and her daughter's satisfaction. She and her daughter voices understanding of the instructions provided during this encounter.   This note was created using a voice recognition software as a result there may be grammatical errors inadvertently enclosed that do not reflect the nature of this encounter. Every attempt is made to correct such errors.   Rex Kras, [place], [facility]  Pager: [phone] Office: [phone]

## 2020-12-18 IMAGING — DX DG CHEST 1V PORT
1 series · 1 of 1 positions shown · non-contrast
Comparison: 03/06/2020

CLINICAL DATA: Patient arrives [facility] for shortness of
breath, hx of CHF and MI, reports she was feeling more short of
breath last night, started lasix this week, EMS found her 85% on
room air, 100% on NRB, atrially paced.

EXAM:
PORTABLE CHEST 1 VIEW

[chest ap]
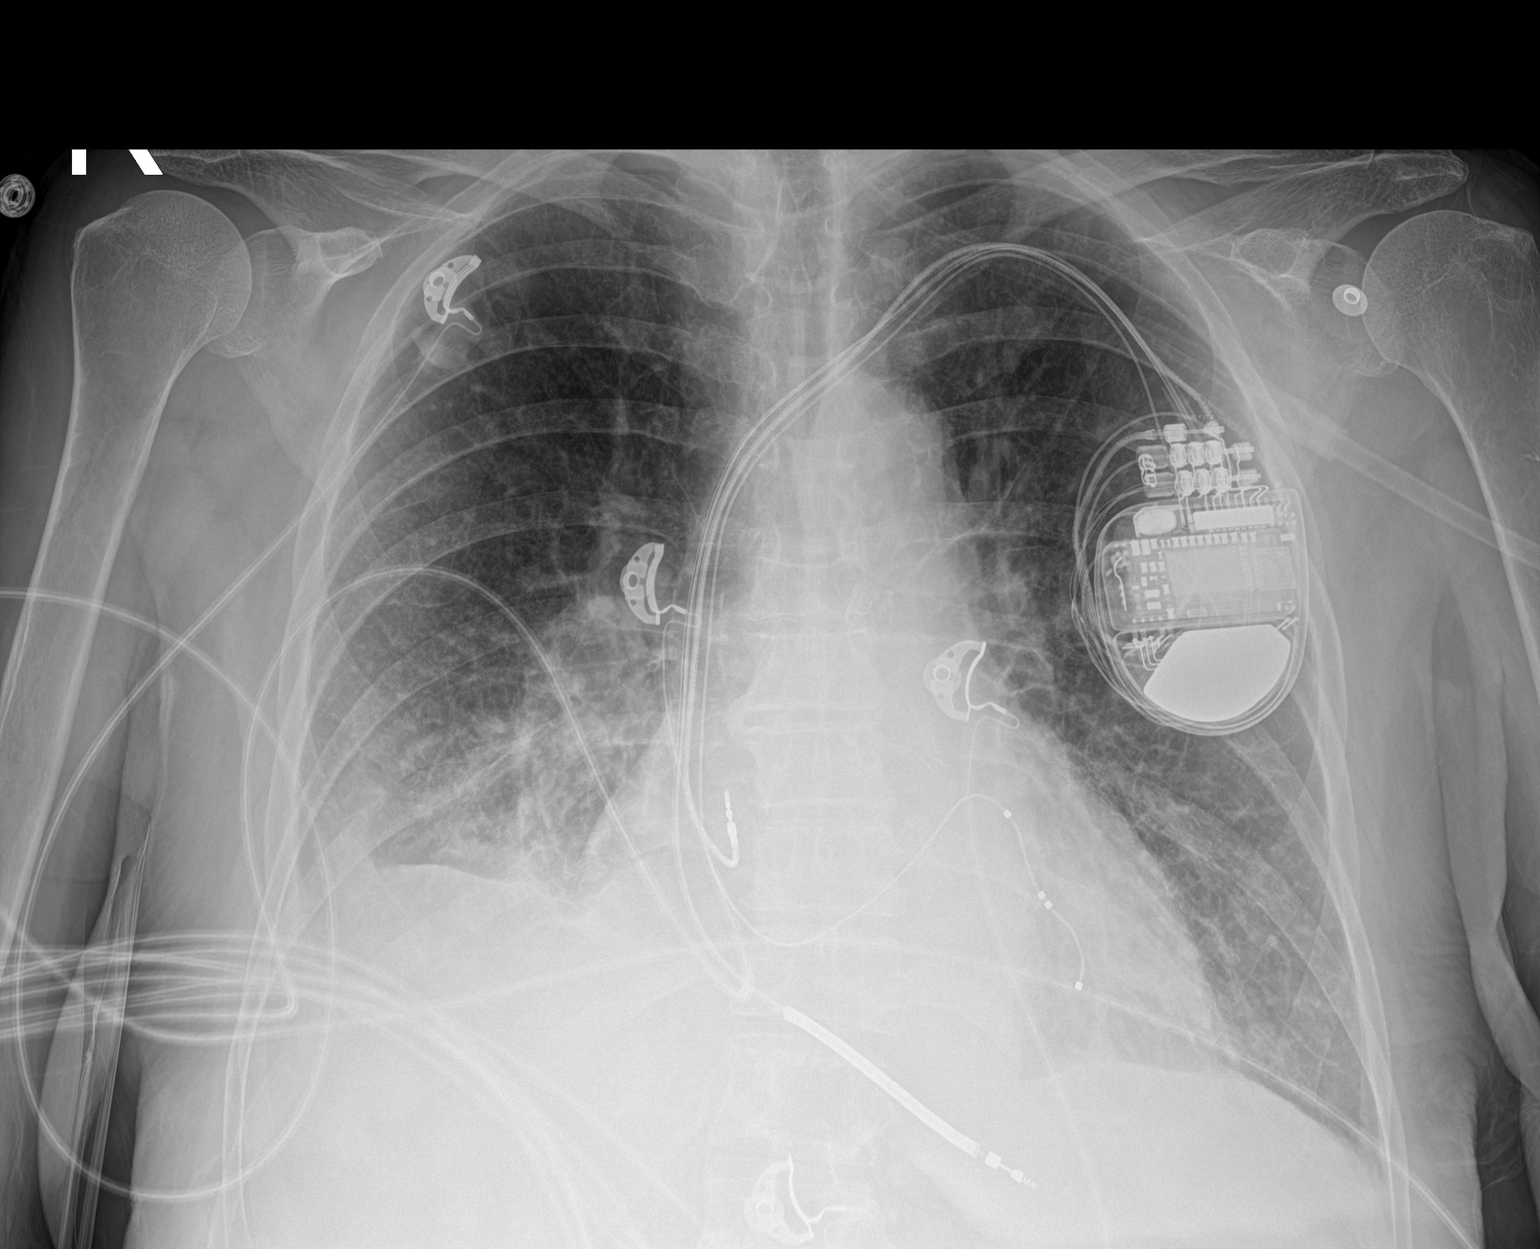

[1 of 1 positions shown; findings below may reference images not displayed]

FINDINGS: Cardiac silhouette mildly enlarged. Stable left anterior chest wall
biventricular cardioverter-defibrillator. No mediastinal or hilar
masses.

Mild interstitial thickening most evident in the lung bases. There
is additional hazy opacity at both lung bases, greater on the right
with the hemidiaphragm is partly obscured.

No pneumothorax.

Skeletal structures are grossly intact.
IMPRESSION: 1. Mild interstitial thickening with additional hazy lung base
opacity, the latter greater on the right. Suspect mild congestive
heart failure with presumed small pleural effusions and basilar
atelectasis. Consider lung base pneumonia, particularly on the
right, if there are consistent clinical findings.

## 2021-01-08 ENCOUNTER — Other Ambulatory Visit: Payer: Self-pay | Admitting: Cardiology

## 2021-01-16 ENCOUNTER — Telehealth: Payer: Self-pay | Admitting: Family Medicine

## 2021-01-16 NOTE — Chronic Care Management (AMB) (Signed)
  Chronic Care Management   Outreach Note  01/16/2021 Name: [name] MRN: [id number] DOB: [birth year]  Referred by: Jinny Sanders, MD Reason for referral : No chief complaint on file.   An unsuccessful telephone outreach was attempted today. The patient was referred to the pharmacist for assistance with care management and care coordination.   Follow Up Plan:   Tatjana Dellinger Upstream Scheduler

## 2021-01-23 ENCOUNTER — Telehealth: Payer: Self-pay | Admitting: Family Medicine

## 2021-01-23 NOTE — Progress Notes (Signed)
  Chronic Care Management   Note  01/23/2021 Name: [name] MRN: [id number] DOB: [birth year]  [name] is a 70 y.o. year old female who is a primary care patient of Bedsole, Amy E, MD. I reached out to [name] by phone today in response to a referral sent by Ms. Bryson Dames Tena's PCP, Jinny Sanders, MD.   [name] was given information about Chronic Care Management services today including:  CCM service includes personalized support from designated clinical staff supervised by her physician, including individualized plan of care and coordination with other care providers 24/7 contact phone numbers for assistance for urgent and routine care needs. Service will only be billed when office clinical staff spend 20 minutes or more in a month to coordinate care. Only one practitioner may furnish and bill the service in a calendar month. The patient may stop CCM services at any time (effective at the end of the month) by phone call to the office staff.   Patient wishes to consider information provided and/or speak with a member of the care team before deciding about enrollment in care management services.   Follow up plan:   Tatjana Secretary/administrator

## 2021-01-30 DIAGNOSIS — I509 Heart failure, unspecified: Secondary | ICD-10-CM | POA: Diagnosis not present

## 2021-01-30 DIAGNOSIS — I129 Hypertensive chronic kidney disease with stage 1 through stage 4 chronic kidney disease, or unspecified chronic kidney disease: Secondary | ICD-10-CM | POA: Diagnosis not present

## 2021-01-30 DIAGNOSIS — N184 Chronic kidney disease, stage 4 (severe): Secondary | ICD-10-CM | POA: Diagnosis not present

## 2021-02-11 ENCOUNTER — Other Ambulatory Visit: Payer: Self-pay | Admitting: *Deleted

## 2021-02-11 MED ORDER — SERTRALINE HCL 50 MG PO TABS: 50.0000 mg | ORAL_TABLET | Freq: Every day | ORAL | 1 refills | Status: DC

## 2021-02-15 ENCOUNTER — Ambulatory Visit (INDEPENDENT_AMBULATORY_CARE_PROVIDER_SITE_OTHER): Payer: Medicare HMO | Admitting: Family Medicine

## 2021-02-15 ENCOUNTER — Encounter: Payer: Self-pay | Admitting: Family Medicine

## 2021-02-15 ENCOUNTER — Other Ambulatory Visit: Payer: Self-pay

## 2021-02-15 VITALS — BP 92/60 | HR 82 | Temp 97.2°F | Ht 64.0 in | Wt 135.5 lb

## 2021-02-15 DIAGNOSIS — L980 Pyogenic granuloma: Secondary | ICD-10-CM | POA: Insufficient documentation

## 2021-02-15 IMAGING — CR DG CHEST 2V
2 series · 2 of 2 positions shown · non-contrast
Comparison: April 14, 2020

CLINICAL DATA: Dyspnea

EXAM:
CHEST - 2 VIEW

[chest pa]
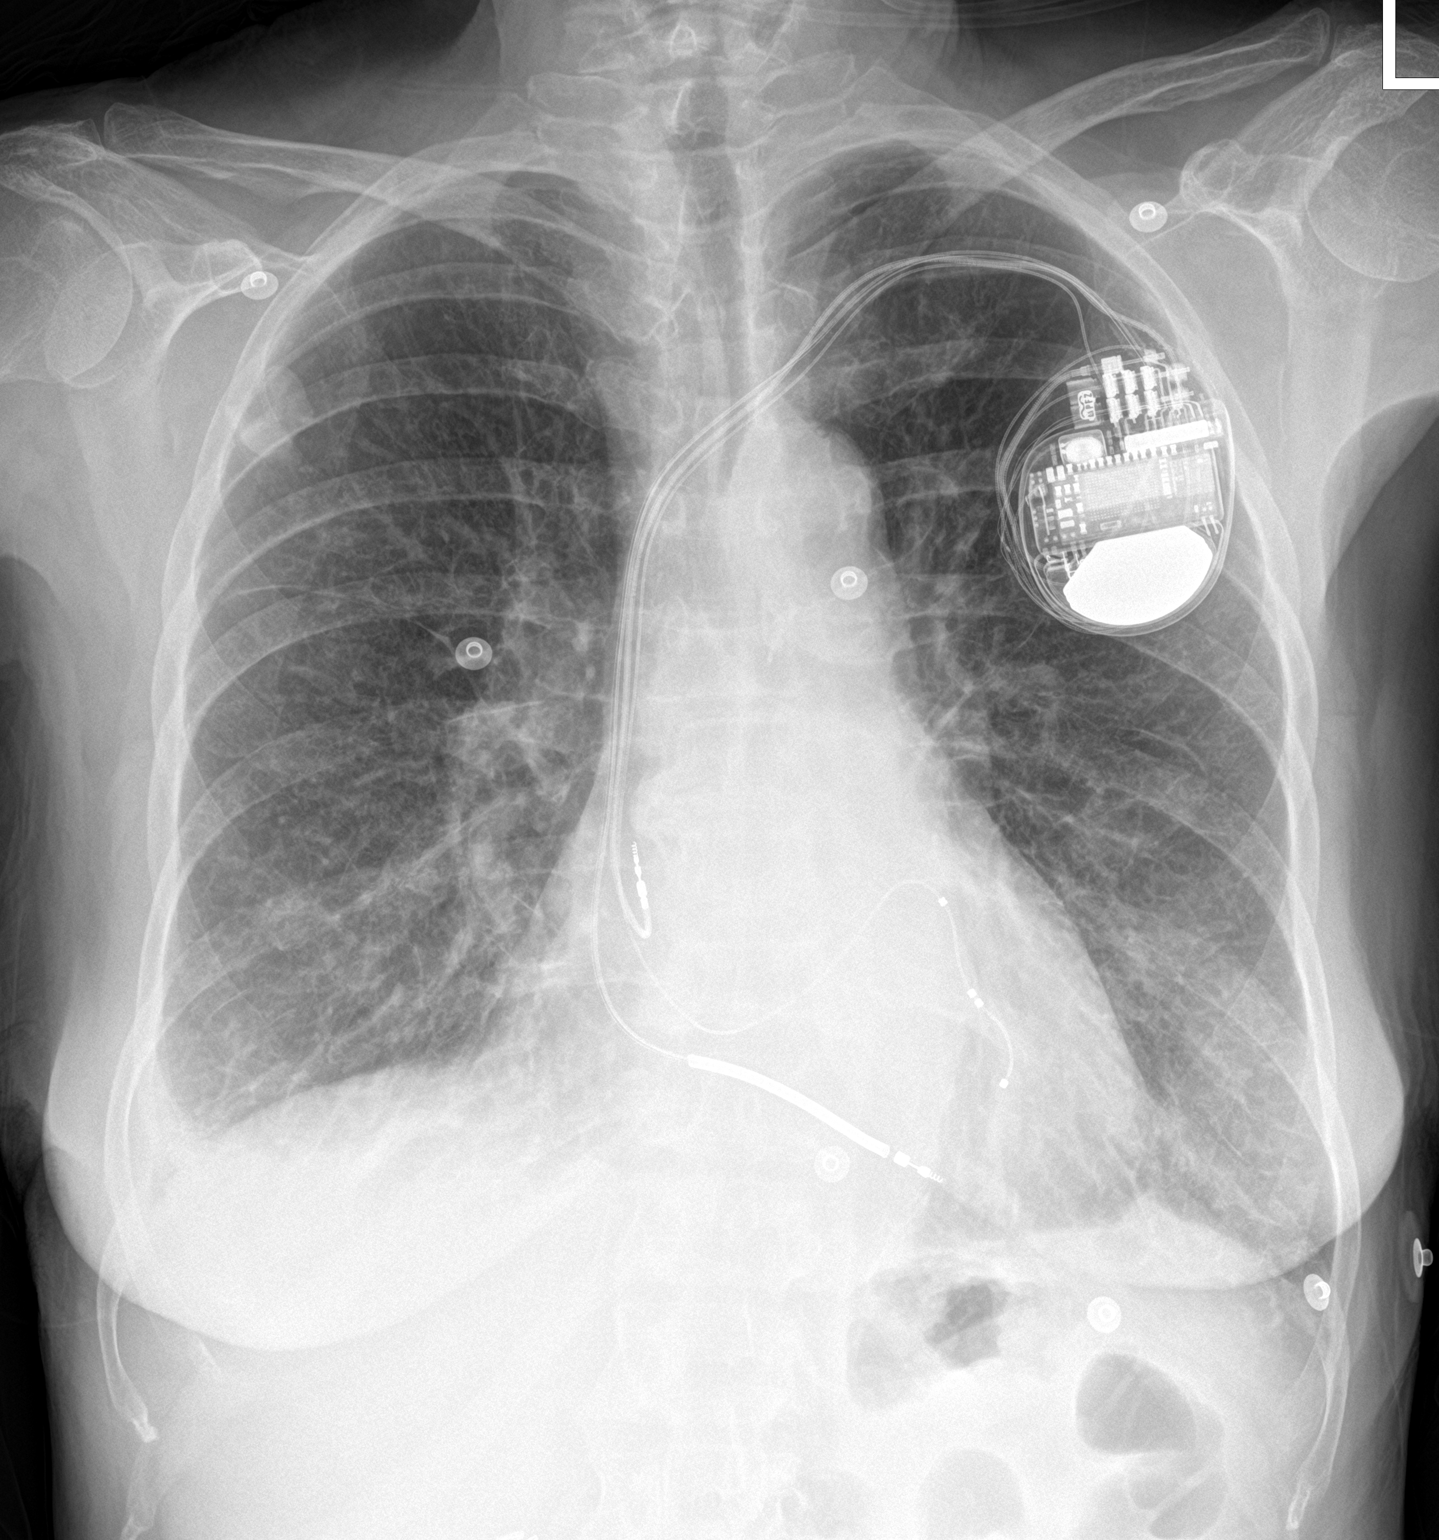

[chest lat]
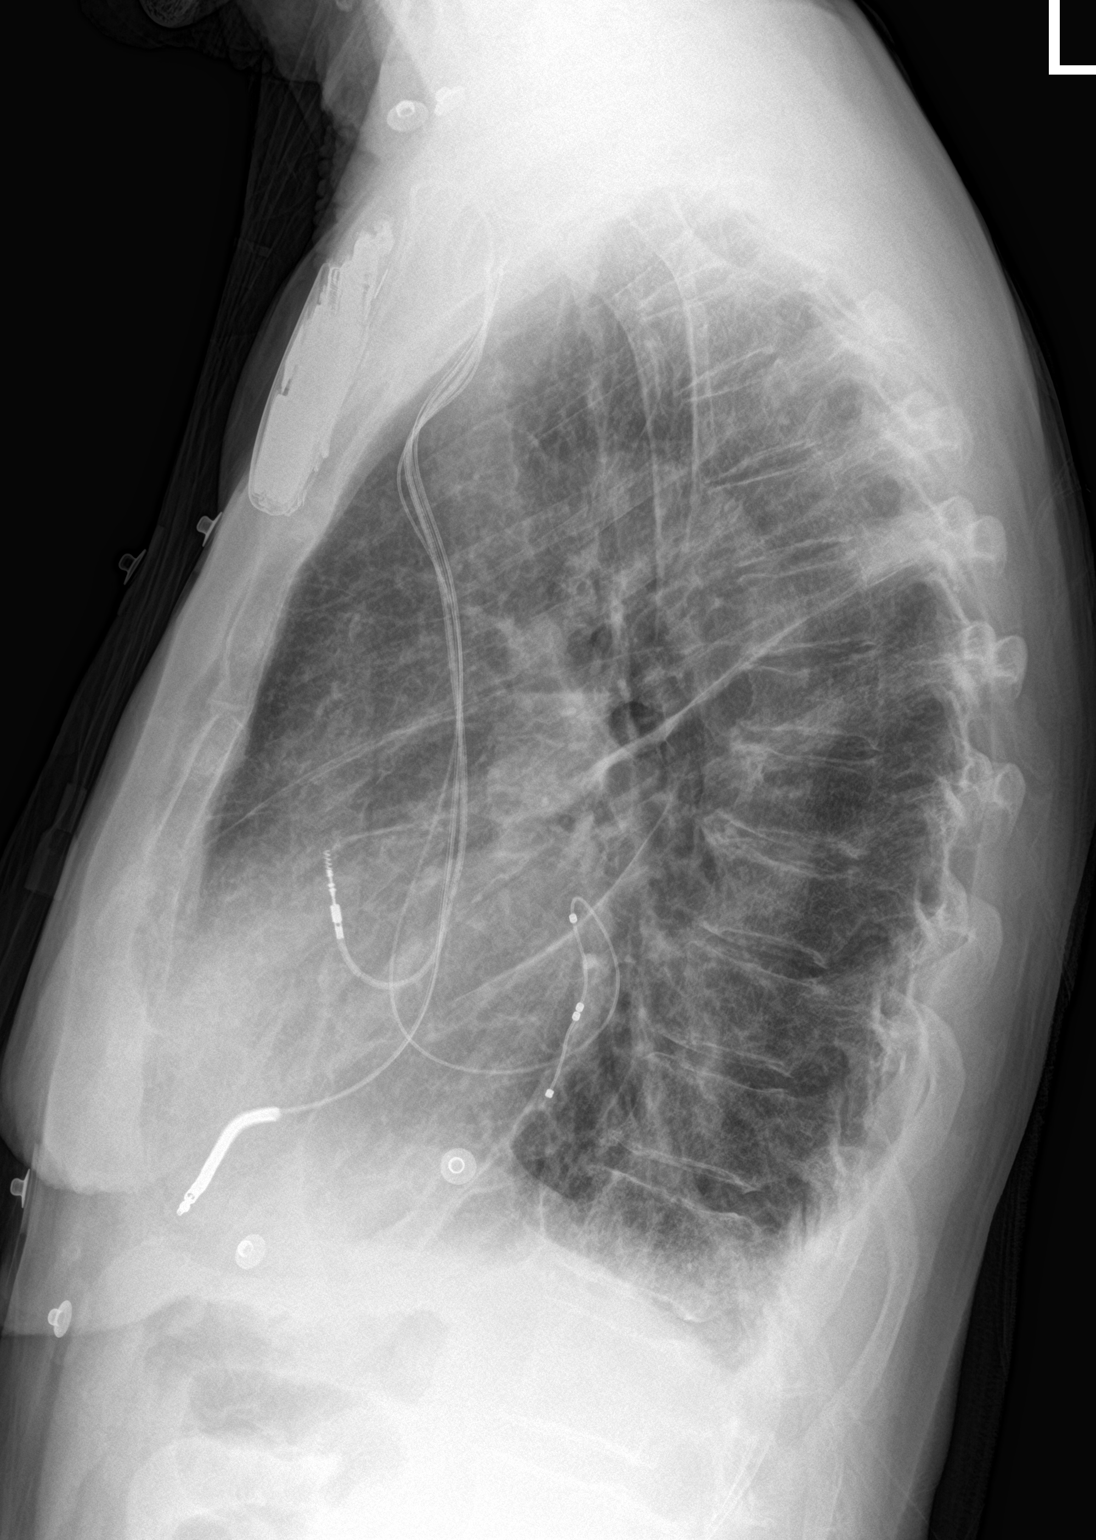

[2 of 2 positions shown; findings below may reference images not displayed]

FINDINGS: The heart size and mediastinal contours are unchanged with mild
cardiomegaly. Aortic knob calcifications are seen. A left-sided
pacemaker seen with the lead tips in the right atrium and right
ventricle. A trace right pleural effusion the pain. Mildly increased
interstitial markings of seen at both lung bases as on prior exam.
The visualized skeletal structures are unremarkable.
IMPRESSION: Trace right pleural effusion.

Increased interstitial markings at both lung bases which may be due
to chronic lung changes

## 2021-02-15 NOTE — Progress Notes (Signed)
Patient ID: [name], female    DOB: 09/01/1950, 70 y.o.   MRN: [id number]  This visit was conducted in person.  BP 92/60   Pulse 82   Temp (!) 97.2 F (36.2 C) (Temporal)   Ht 5\' 4"  (1.626 m)   Wt 135 lb 8 oz (61.5 kg)   SpO2 97%   BMI 23.26 kg/m    CC: Chief Complaint  Patient presents with   Breast Injury    Left Knipple got hit by crock pot and has been bleeding    Subjective:   HPI: [name] is a 70 y.o. female presenting on 02/15/2021 for Breast Injury (Left Knipple got hit by crock pot and has been bleeding)  She reports  3 weeks ago.. she hit left nipple with  falling crock pot. Next morning she saw bleeding of nipple and nipple pain.   No prior nipple issue or rash.  Since the nipple keeps seeping clear bloody fluid.  Occ pain.  No breast lump   No spreading redness.  She has been applying neosporin and bandaid.      Relevant past medical, surgical, family and social history reviewed and updated as indicated. Interim medical history since our last visit reviewed. Allergies and medications reviewed and updated. Outpatient Medications Prior to Visit  Medication Sig Dispense Refill   acetaminophen (TYLENOL) 500 MG tablet Take 1,000 mg by mouth 2 (two) times daily as needed (pain).     albuterol (VENTOLIN HFA) 108 (90 Base) MCG/ACT inhaler INHALE 2 PUFFS INTO THE LUNGS EVERY TWO HOURS AS NEEDED FOR WHEEZING OR SHORTNESS OF BREATH. 18 g 0   apixaban (ELIQUIS) 2.5 MG TABS tablet TAKE 1 TABLET (2.5 MG TOTAL) BY MOUTH TWO TIMES DAILY. 180 tablet 1   aspirin 81 MG chewable tablet Chew 1 tablet (81 mg total) by mouth daily. 30 tablet 0   carvedilol (COREG) 3.125 MG tablet TAKE 1 TABLET (3.125 MG TOTAL) BY MOUTH 2 (TWO) TIMES DAILY WITH A MEAL. 180 tablet 1   Cholecalciferol (VITAMIN D3 PO) Take 1,000 Units by mouth in the morning and at bedtime.     ezetimibe (ZETIA) 10 MG tablet TAKE 1 TABLET BY MOUTH EVERY DAY 90 tablet 1   famotidine (PEPCID) 20 MG  tablet TAKE 1 TABLET BY MOUTH TWICE A DAY 180 tablet 1   fluticasone (FLONASE) 50 MCG/ACT nasal spray Place into both nostrils daily.     furosemide (LASIX) 20 MG tablet TAKE 1 TABLET BY MOUTH TWICE A DAY (Patient taking differently: Take 20 mg by mouth daily.) 180 tablet 1   Magnesium 400 MG TABS Take 400 mg by mouth 2 (two) times daily.      nitroGLYCERIN (NITROSTAT) 0.4 MG SL tablet Place 1 tablet (0.4 mg total) under the tongue every 5 (five) minutes as needed for chest pain. 30 tablet 3   ondansetron (ZOFRAN ODT) 4 MG disintegrating tablet Take 1 tablet (4 mg total) by mouth every 8 (eight) hours as needed for nausea or vomiting. 20 tablet 1   polyethylene glycol powder (GLYCOLAX/MIRALAX) 17 GM/SCOOP powder DISSOLVE 1 CAPFUL IN WATER NAD TAKE BY MOUTH DAILY. 238 g 0   rosuvastatin (CRESTOR) 10 MG tablet TAKE 1 TABLET (10 MG TOTAL) BY MOUTH DAILY AT 6 PM. 90 tablet 1   sertraline (ZOLOFT) 50 MG tablet Take 1 tablet (50 mg total) by mouth at bedtime. 90 tablet 1   vitamin B-12 (CYANOCOBALAMIN) 1000 MCG tablet Take 1,000 mcg by mouth  daily.     No facility-administered medications prior to visit.     Per HPI unless specifically indicated in ROS section below Review of Systems  Constitutional:  Negative for fatigue and fever.  HENT:  Negative for congestion.   Eyes:  Negative for pain.  Respiratory:  Negative for cough and shortness of breath.   Cardiovascular:  Negative for chest pain, palpitations and leg swelling.  Gastrointestinal:  Negative for abdominal pain.  Genitourinary:  Negative for dysuria and vaginal bleeding.  Musculoskeletal:  Negative for back pain.  Neurological:  Negative for syncope, light-headedness and headaches.  Psychiatric/Behavioral:  Negative for dysphoric mood.   Objective:  BP 92/60   Pulse 82   Temp (!) 97.2 F (36.2 C) (Temporal)   Ht 5\' 4"  (1.626 m)   Wt 135 lb 8 oz (61.5 kg)   SpO2 97%   BMI 23.26 kg/m   Wt Readings from Last 3 Encounters:   02/15/21 135 lb 8 oz (61.5 kg)  12/06/20 136 lb (61.7 kg)  10/25/20 131 lb 12 oz (59.8 kg)      Physical Exam Chest:  Breasts:    Left: Bleeding, nipple discharge, skin change and tenderness present.     Comments: Left nipple with clear bloody drainage from injury.[name] appears to be granuloma. Breast exam normal otherwise.      Results for orders placed or performed in visit on 84/66/59  Basic Metabolic Panel  Result Value Ref Range   Sodium 142 135 - 145 mEq/L   Potassium 4.3 3.5 - 5.1 mEq/L   Chloride 104 96 - 112 mEq/L   CO2 28 19 - 32 mEq/L   Glucose, Bld 89 70 - 99 mg/dL   BUN 29 (H) 6 - 23 mg/dL   Creatinine, Ser 2.60 (H) 0.40 - 1.20 mg/dL   GFR 18.16 (L) >60.00 mL/min   Calcium 9.1 8.4 - 10.5 mg/dL    This visit occurred during the SARS-CoV-2 public health emergency.  Safety protocols were in place, including screening questions prior to the visit, additional usage of staff PPE, and extensive cleaning of exam room while observing appropriate contact time as indicated for disinfecting solutions.   COVID 19 screen:  No recent travel or known exposure to COVID19 The patient denies respiratory symptoms of COVID 19 at this time. The importance of social distancing was discussed today.   Assessment and Plan    Problem List Items Addressed This Visit     Pyogenic granuloma - Primary    Acute, not resolving  S/P breast nipple injury Call Dr.Lupton dermatology for laser or  surgical removal of likely nipple pyogenic  granuloma.        [name], MD

## 2021-02-15 NOTE — Patient Instructions (Signed)
Call Dr.Lupton dermatology for laser or  surgical removal of likely nipple pyogenic  granuloma.

## 2021-02-26 ENCOUNTER — Other Ambulatory Visit: Payer: Self-pay | Admitting: Gastroenterology

## 2021-03-18 NOTE — Assessment & Plan Note (Signed)
Acute, not resolving  S/P breast nipple injury Call Dr.Lupton dermatology for laser or  surgical removal of likely nipple pyogenic  granuloma.

## 2021-04-05 DIAGNOSIS — I5022 Chronic systolic (congestive) heart failure: Secondary | ICD-10-CM | POA: Diagnosis not present

## 2021-04-05 DIAGNOSIS — Z95810 Presence of automatic (implantable) cardiac defibrillator: Secondary | ICD-10-CM | POA: Diagnosis not present

## 2021-04-05 DIAGNOSIS — I4901 Ventricular fibrillation: Secondary | ICD-10-CM | POA: Diagnosis not present

## 2021-04-05 DIAGNOSIS — Z4502 Encounter for adjustment and management of automatic implantable cardiac defibrillator: Secondary | ICD-10-CM | POA: Diagnosis not present

## 2021-05-28 DIAGNOSIS — I129 Hypertensive chronic kidney disease with stage 1 through stage 4 chronic kidney disease, or unspecified chronic kidney disease: Secondary | ICD-10-CM | POA: Diagnosis not present

## 2021-05-28 DIAGNOSIS — N184 Chronic kidney disease, stage 4 (severe): Secondary | ICD-10-CM | POA: Diagnosis not present

## 2021-05-28 DIAGNOSIS — I509 Heart failure, unspecified: Secondary | ICD-10-CM | POA: Diagnosis not present

## 2021-06-10 ENCOUNTER — Other Ambulatory Visit: Payer: Self-pay | Admitting: Cardiology

## 2021-06-18 ENCOUNTER — Ambulatory Visit: Payer: Medicare HMO | Admitting: Cardiology

## 2021-06-18 ENCOUNTER — Other Ambulatory Visit: Payer: Self-pay

## 2021-06-18 ENCOUNTER — Encounter: Payer: Self-pay | Admitting: Cardiology

## 2021-06-18 VITALS — BP 119/63 | HR 75 | Temp 97.7°F | Resp 16 | Ht 64.0 in | Wt 144.2 lb

## 2021-06-18 DIAGNOSIS — I5022 Chronic systolic (congestive) heart failure: Secondary | ICD-10-CM | POA: Diagnosis not present

## 2021-06-18 DIAGNOSIS — I10 Essential (primary) hypertension: Secondary | ICD-10-CM | POA: Diagnosis not present

## 2021-06-18 DIAGNOSIS — N184 Chronic kidney disease, stage 4 (severe): Secondary | ICD-10-CM | POA: Diagnosis not present

## 2021-06-18 DIAGNOSIS — I447 Left bundle-branch block, unspecified: Secondary | ICD-10-CM | POA: Diagnosis not present

## 2021-06-18 DIAGNOSIS — I429 Cardiomyopathy, unspecified: Secondary | ICD-10-CM

## 2021-06-18 DIAGNOSIS — Z8679 Personal history of other diseases of the circulatory system: Secondary | ICD-10-CM

## 2021-06-18 DIAGNOSIS — Z87891 Personal history of nicotine dependence: Secondary | ICD-10-CM

## 2021-06-18 DIAGNOSIS — Z7901 Long term (current) use of anticoagulants: Secondary | ICD-10-CM

## 2021-06-18 DIAGNOSIS — I469 Cardiac arrest, cause unspecified: Secondary | ICD-10-CM

## 2021-06-18 DIAGNOSIS — Z95810 Presence of automatic (implantable) cardiac defibrillator: Secondary | ICD-10-CM

## 2021-06-18 DIAGNOSIS — Z8673 Personal history of transient ischemic attack (TIA), and cerebral infarction without residual deficits: Secondary | ICD-10-CM | POA: Diagnosis not present

## 2021-06-18 DIAGNOSIS — E782 Mixed hyperlipidemia: Secondary | ICD-10-CM | POA: Diagnosis not present

## 2021-06-18 DIAGNOSIS — I480 Paroxysmal atrial fibrillation: Secondary | ICD-10-CM

## 2021-06-18 MED ORDER — APIXABAN 5 MG PO TABS: 5.0000 mg | ORAL_TABLET | Freq: Two times a day (BID) | ORAL | 1 refills | Status: DC

## 2021-06-18 NOTE — Progress Notes (Signed)
[name] Date of Birth: [birth year] MRN: [id number] Primary Care Provider:Bedsole, Mervyn Gay, MD Primary Cardiologist: Rex Kras, DO, [facility] Electrophysiologist: Dr. Reggy Eye   Date: 06/18/21 Last Office Visit: 12/06/2020  Chief Complaint  Patient presents with   heart failure management   Follow-up   HPI  [name] is a 71 y.o. female who presents to the office with a chief complaint of "10-month follow-up for congestive heart failure management." Patient's past medical history and cardiac risk factors include: Hypertension, hyperlipidemia, former smoker, history of Arnold-Chiari malformation, history of VT/VF, history of torsades, cardiac arrest, nonischemic cardiomyopathy, peripheral vascular disease, paroxysmal atrial fibrillation, left bundle branch block, history of CVA.  Patient is accompanied by her daughter [name] at today's office visit.   Patient has had a complex cardiac history dating back to 2021 when she presented with VT/VF arrest and was noted to have episodes of torsades de point as well as nonischemic cardiomyopathy.  She was sent home on LifeVest which successfully defibrillated her due to V. fib arrest and subsequently underwent BiV ICD implantation.  She now presents for 46-month follow-up visit.  No recent hospitalizations or congestive heart failure.  She denies any angina pectoralis.  Overall functional status remains relatively stable with day-to-day activities without any structured exercise program.  She denies any near-syncope or syncopal events.  Patient states that her diet has increased she has gained weight since her last office visit.  And her renal function has slightly improved.  She overall appears to be in good spirits compared to prior office visits.  ALLERGIES: Allergies  Allergen Reactions   Shellfish-Derived Products Anaphylaxis   Ativan [Lorazepam] Other (See Comments)    Makes "skin crawl" and insomnia   Buspar [Buspirone]  Nausea Only    Sweating and dizzy   Clarithromycin Swelling   Codeine Nausea And Vomiting   Doxycycline Other (See Comments)    Unknown   Guaifenesin Other (See Comments)    Palpitations   Oxycodone Nausea And Vomiting   Prednisone Nausea And Vomiting and Other (See Comments)    Makes my heart race   Statins Other (See Comments)    Muscle pain   Sulfonamide Derivatives Nausea And Vomiting    Achiness   Tetracycline Nausea Only   Vicodin [Hydrocodone-Acetaminophen] Nausea And Vomiting   Famotidine Rash   Latex Rash   Omeprazole Nausea Only    Cough, shortness of breath - patient doesn't remember   Peanut-Containing Drug Products Itching and Rash   Protonix [Pantoprazole] Rash   Wellbutrin [Bupropion] Palpitations   MEDICATION LIST PRIOR TO VISIT: Current Outpatient Medications on File Prior to Visit  Medication Sig Dispense Refill   acetaminophen (TYLENOL) 500 MG tablet Take 1,000 mg by mouth 2 (two) times daily as needed (pain).     aspirin 81 MG chewable tablet Chew 1 tablet (81 mg total) by mouth daily. 30 tablet 0   carvedilol (COREG) 3.125 MG tablet TAKE 1 TABLET (3.125 MG TOTAL) BY MOUTH 2 (TWO) TIMES DAILY WITH A MEAL. 180 tablet 1   Cholecalciferol (VITAMIN D3 PO) Take 1,000 Units by mouth in the morning and at bedtime.     ezetimibe (ZETIA) 10 MG tablet TAKE 1 TABLET BY MOUTH EVERY DAY 90 tablet 1   famotidine (PEPCID) 20 MG tablet TAKE 1 TABLET BY MOUTH TWICE A DAY 180 tablet 1   furosemide (LASIX) 20 MG tablet TAKE 1 TABLET BY MOUTH TWICE A DAY (Patient taking differently: Take 20 mg by mouth daily.)  180 tablet 1   Magnesium 400 MG TABS Take 400 mg by mouth 2 (two) times daily.      nitroGLYCERIN (NITROSTAT) 0.4 MG SL tablet Place 1 tablet (0.4 mg total) under the tongue every 5 (five) minutes as needed for chest pain. 30 tablet 3   rosuvastatin (CRESTOR) 10 MG tablet TAKE 1 TABLET (10 MG TOTAL) BY MOUTH DAILY AT 6 PM. 90 tablet 1   sertraline (ZOLOFT) 50 MG tablet  Take 1 tablet (50 mg total) by mouth at bedtime. 90 tablet 1   vitamin B-12 (CYANOCOBALAMIN) 1000 MCG tablet Take 1,000 mcg by mouth daily.     No current facility-administered medications on file prior to visit.    PAST MEDICAL HISTORY: Past Medical History:  Diagnosis Date   Allergy    Anxiety    Arnold-Chiari malformation (HCC)    Arthritis    CHF (congestive heart failure) ([place])    Chronic systolic heart failure ([place]) 11/03/2019   Coronary artery disease    Dyspnea    Encounter for assessment of implantable cardioverter-defibrillator (ICD) 11/10/2019   GERD (gastroesophageal reflux disease)    H. pylori infection    3 years ago   H/O cardiac arrest    Hx of VT/Vfib arrest   Hyperlipidemia    ICD BiV Medtronic model [id number] Claria Quad CRT-D SureScan ICD 08/25/2019    Incontinence    Paroxysmal atrial fibrillation ([place]) 08/06/2019   Pulmonary edema 2021   Syncope and collapse    Per pt, denies passing out   Tobacco use disorder    Unspecified essential hypertension     PAST SURGICAL HISTORY: Past Surgical History:  Procedure Laterality Date   APPENDECTOMY     ARNOLD CHIARI SURGERY     neurocranial surgery   BIV ICD INSERTION CRT-D N/A 08/25/2019   Procedure: BIV ICD INSERTION CRT-D;  Surgeon: Constance Haw, MD;  Location: [place] [facility];  Service: Cardiovascular;  Laterality: N/A;   BLEPHAROPLASTY     Bil   C-EYE SURGERY PROCEDURE     CARDIAC CATHETERIZATION     CHOLECYSTECTOMY     EYE SURGERY     heart failure     LEFT HEART CATH AND CORONARY ANGIOGRAPHY N/A 08/04/2019   Procedure: LEFT HEART CATH AND CORONARY ANGIOGRAPHY;  Surgeon: Adrian Prows, MD;  Location: [place] [facility];  Service: Cardiovascular;  Laterality: N/A;   MASS EXCISION Right 12/27/2013   Procedure: MINOR EXCISION OF RIGHT THUMB MUCOID CYST, DEBRIDEMENT OF INTERPHALANGEAL JOINT;  Surgeon: Cammie Sickle, MD;  Location: [place];  Service: Orthopedics;  Laterality:  Right;   mass on thumb  right   TOTAL KNEE ARTHROPLASTY Right 02/17/2017   TOTAL KNEE ARTHROPLASTY Right 02/17/2017   Procedure: TOTAL KNEE ARTHROPLASTY;  Surgeon: Melrose Nakayama, MD;  Location: [place];  Service: Orthopedics;  Laterality: Right;   TUBAL LIGATION      FAMILY HISTORY: The patient family history includes Bone cancer in her mother; Cancer in her sister; Diverticulosis in her sister; Heart disease in her mother; Hypertension in her sister; Tuberculosis in her father.   SOCIAL HISTORY:  The patient  reports that she quit smoking about 22 months ago. Her smoking use included cigarettes. She has a 50.00 pack-year smoking history. She has never used smokeless tobacco. She reports that she does not drink alcohol and does not use drugs.  Review of Systems  Constitutional: Negative for chills, fever and weight loss.  HENT:  Negative  for hoarse voice and nosebleeds.   Eyes:  Negative for discharge, double vision and pain.  Cardiovascular:  Negative for chest pain, claudication, dyspnea on exertion, leg swelling, near-syncope, orthopnea, palpitations, paroxysmal nocturnal dyspnea and syncope.  Respiratory:  Negative for hemoptysis and shortness of breath.   Musculoskeletal:  Negative for muscle cramps and myalgias.  Gastrointestinal:  Negative for abdominal pain, constipation, diarrhea, hematemesis, hematochezia, melena, nausea and vomiting.  Neurological:  Negative for dizziness and light-headedness.   PHYSICAL EXAM: Vitals with BMI 06/18/2021 02/15/2021 12/06/2020  Height 5\' 4"  5\' 4"  5\' 4"   Weight 144 lbs 3 oz 135 lbs 8 oz 136 lbs  BMI 24.74 34.28 76.81  Systolic 157 92 262  Diastolic 63 60 71  Pulse 75 82 86   CONSTITUTIONAL: Appears older than stated age, hemodynamically stable, no acute distress.    SKIN: Skin is warm and dry. No rash noted. No cyanosis. No pallor. No jaundice HEAD: Normocephalic and atraumatic.  EYES: No scleral icterus MOUTH/THROAT: Moist oral membranes.   NECK: No JVD present. No thyromegaly noted.   LYMPHATIC: No visible cervical adenopathy.  CHEST Normal respiratory effort. No intercostal retractions.  BiV ICD noted left infraclavicular region. LUNGS: Clear to auscultation bilaterally.  No stridor. No wheezes. No rales.  CARDIOVASCULAR: Regular, positive M3-T5, soft holosystolic murmur heard at the apex radiating to axilla, no gallops or rubs appreciated ABDOMINAL: Nonobese, soft, nontender, nondistended, positive bowel sounds in all 4 quadrants no apparent ascites.  EXTREMITIES: No bilateral peripheral edema. 2+ right femoral pulse, 1+ left femoral pulse, none palpable bilateral popliteal, dorsalis pedis or posterior tibial pulses.  Warm to touch bilaterally.  No discoloration or cyanosis present. HEMATOLOGIC: No significant bruising NEUROLOGIC: Oriented to person, place, and time. Nonfocal. Normal muscle tone.  PSYCHIATRIC: Normal mood and affect. Normal behavior. Cooperative  CARDIAC DATABASE: EKG: 08/12/2019: Normal sinus rhythm with ventricular rate of 61 bpm, left axis deviation, left bundle branch block, nonspecific T wave abnormalities.  Prior EKG dated 08/04/2019 shows a normal sinus rhythm, left axis deviation, left bundle branch block. 12/08/2019: Atrial paced,  Ventricular sensed rhythm. 12/06/2020: Presence of dual-chamber pacemaker, 71 bpm. 06/18/2021: Atrial paced ventricular sensed rhythm, diffuse ST-T changes, IVCD, LAE.  Echocardiogram: 08/01/2020:   1. Left ventricular ejection fraction, by estimation, is 25 to 30%. The left ventricle has severely decreased function. The left ventricle demonstrates global hypokinesis. There is moderate asymmetric left ventricular hypertrophy of the basal-septal segment. Left ventricular diastolic parameters are consistent with Grade I diastolic dysfunction (impaired relaxation).   2. Right ventricular systolic function is normal. The right ventricular size is normal. There is normal pulmonary  artery systolic pressure. The estimated right ventricular systolic pressure is 97.4 mmHg.   3. The mitral valve is degenerative. Mild mitral valve regurgitation.   4. The aortic valve was not well visualized. Aortic valve regurgitation is not visualized. No aortic stenosis is present.   5. The inferior vena cava is normal in size with greater than 50% respiratory variability, suggesting right atrial pressure of 3 mmHg.   Heart Catheterization: 08/04/19:  LV: Global hypokinesis, upper limit of normal size, EF 25 to 30%. Left main: Normal. LAD: Mild diffuse disease.  Proximal LAD has a 30 to 40% stenosis, mid segment has a 20 to 30% stenosis, scattered disease noted in the LAD.  Brisk flow. Circumflex: Again scattered disease noted in the circumflex.  Mid segment has at most a 30 to 40% stenosis which appears to be eccentric and calcified. RCA: Dominant.  Mild diffuse disease again noted.  Mid segment after the origin of RV branch has a 70 to 80% stenosis.  Brisk flow is evident throughout the RCA.  Impression: Findings consistent with nonischemic cardiomyopathy.  Although she has significant disease in the right coronary artery, this does not explain her presentation with global hypokinesis, neither does it explain VF arrest as the lesion does not appear to be unstable.   Biventricular ICD implantation 08/25/2019:  ICD BiV Medtronic model [id number] Claria Quad CRT-D SureScan ICD: Remote BiV ICD transmission 04/10/2021:  AP 98%, BP 98%.  There were frequent mode switches, 171 revealing brief episodes of A. fib.  AT/AF burden 20% 5 Brief VT episodes, no therapy..  No episodes of atrial fibrillation since 12/13/2020.  Longest 110 minutes. Thoracic impedance is at baseline and does not suggest fluid overload state.  Normal ICD function.  Carotid duplex: 08/03/2019: Right Carotid: Velocities in the right ICA are consistent with a 1-39% stenosis. Left Carotid: Velocities in the left ICA are consistent  with a 1-39%  stenosis. Vertebrals:  Bilateral vertebral arteries demonstrate antegrade flow. Subclavians: Normal flow hemodynamics were seen in bilateral subclavian  arteries.  LABORATORY DATA: CBC Latest Ref Rng & Units 07/30/2020 06/12/2020 04/23/2020  WBC 4.0 - 10.5 K/uL 6.0 6.0 5.4  Hemoglobin 12.0 - 15.0 g/dL 11.2(L) 10.6(L) 10.7(L)  Hematocrit 36.0 - 46.0 % 34.5(L) 32.4(L) 32.8(L)  Platelets 150 - 400 K/uL 196 173 223.0    CMP Latest Ref Rng & Units 10/25/2020 08/24/2020 08/16/2020  Glucose 70 - 99 mg/dL 89 104(H) 84  BUN 6 - 23 mg/dL 29(H) 27(H) 39(H)  Creatinine 0.40 - 1.20 mg/dL 2.60(H) 3.06(H) 3.57(H)  Sodium 135 - 145 mEq/L 142 138 136  Potassium 3.5 - 5.1 mEq/L 4.3 4.4 4.9  Chloride 96 - 112 mEq/L 104 102 100  CO2 19 - 32 mEq/L 28 28 28   Calcium 8.4 - 10.5 mg/dL 9.1 9.3 9.6  Total Protein 6.5 - 8.1 g/dL - - -  Total Bilirubin 0.3 - 1.2 mg/dL - - -  Alkaline Phos 38 - 126 U/L - - -  AST 15 - 41 U/L - - -  ALT 0 - 44 U/L - - -    Lipid Panel     Component Value Date/Time   CHOL 114 09/21/2019 1416   TRIG 147 09/21/2019 1416   HDL 33 (L) 09/21/2019 1416   CHOLHDL 7.5 08/03/2019 0249   VLDL 26 08/03/2019 0249   LDLCALC 55 09/21/2019 1416   LDLDIRECT 193.0 09/02/2016 0839   LABVLDL 26 09/21/2019 1416    Lab Results  Component Value Date   HGBA1C 5.7 (H) 08/03/2019   No components found for: NTPROBNP Lab Results  Component Value Date   TSH 1.487 12/07/2019   TSH 0.550 08/24/2019   TSH 0.75 08/23/2019   External Labs: Collected: 05/28/2021 at [facility] BUN 27, creatinine 2.29 mg/dL Sodium 140, potassium 4.2, chloride 104, bicarb 30 Total cholesterol 136 HDL 44, LDL 62,  triglycerides 179 Hemoglobin 11.6 g/dL.  FINAL MEDICATION LIST END OF ENCOUNTER:   Current Outpatient Medications:    acetaminophen (TYLENOL) 500 MG tablet, Take 1,000 mg by mouth 2 (two) times daily as needed (pain)., Disp: , Rfl:    aspirin 81 MG chewable tablet, Chew 1 tablet (81 mg  total) by mouth daily., Disp: 30 tablet, Rfl: 0   carvedilol (COREG) 3.125 MG tablet, TAKE 1 TABLET (3.125 MG TOTAL) BY MOUTH 2 (TWO) TIMES DAILY WITH A MEAL., Disp: 180 tablet,  Rfl: 1   Cholecalciferol (VITAMIN D3 PO), Take 1,000 Units by mouth in the morning and at bedtime., Disp: , Rfl:    ezetimibe (ZETIA) 10 MG tablet, TAKE 1 TABLET BY MOUTH EVERY DAY, Disp: 90 tablet, Rfl: 1   famotidine (PEPCID) 20 MG tablet, TAKE 1 TABLET BY MOUTH TWICE A DAY, Disp: 180 tablet, Rfl: 1   furosemide (LASIX) 20 MG tablet, TAKE 1 TABLET BY MOUTH TWICE A DAY (Patient taking differently: Take 20 mg by mouth daily.), Disp: 180 tablet, Rfl: 1   Magnesium 400 MG TABS, Take 400 mg by mouth 2 (two) times daily. , Disp: , Rfl:    nitroGLYCERIN (NITROSTAT) 0.4 MG SL tablet, Place 1 tablet (0.4 mg total) under the tongue every 5 (five) minutes as needed for chest pain., Disp: 30 tablet, Rfl: 3   rosuvastatin (CRESTOR) 10 MG tablet, TAKE 1 TABLET (10 MG TOTAL) BY MOUTH DAILY AT 6 PM., Disp: 90 tablet, Rfl: 1   sertraline (ZOLOFT) 50 MG tablet, Take 1 tablet (50 mg total) by mouth at bedtime., Disp: 90 tablet, Rfl: 1   vitamin B-12 (CYANOCOBALAMIN) 1000 MCG tablet, Take 1,000 mcg by mouth daily., Disp: , Rfl:    apixaban (ELIQUIS) 5 MG TABS tablet, Take 1 tablet (5 mg total) by mouth 2 (two) times daily., Disp: 180 tablet, Rfl: 1  IMPRESSION:    ICD-10-CM   1. Chronic HFrEF (heart failure with reduced ejection fraction) (HCC)  I50.22 EKG 12-Lead    PCV ECHOCARDIOGRAM COMPLETE    2. Cardiomyopathy, unspecified type ([place])  I42.9 PCV ECHOCARDIOGRAM COMPLETE    3. Essential hypertension  I10     4. Chronic kidney disease, stage IV (severe) (HCC)  N18.4     5. Paroxysmal atrial fibrillation (HCC)  I48.0 apixaban (ELIQUIS) 5 MG TABS tablet    6. Long term current use of anticoagulant  Z79.01 apixaban (ELIQUIS) 5 MG TABS tablet    7. Cardiac arrest with ventricular fibrillation (HCC)  I46.9    I49.01     8.  Biventricular ICD (implantable cardioverter-defibrillator) in place  Z95.810     9. History of torsades de pointes  Z86.79     10. Left bundle branch block  I44.7     11. History of CVA (cerebrovascular accident)  Z2.73     12. Mixed hyperlipidemia  E78.2     13. Former smoker  Z87.891        RECOMMENDATIONS: [name] is a 71 y.o. female whose past medical history and cardiac risk factors include: Hypertension, hyperlipidemia, former smoker, history of Arnold-Chiari malformation, history of VT/VF, history of torsades, cardiac arrest, nonischemic cardiomyopathy, peripheral vascular disease, paroxysmal atrial fibrillation, left bundle branch block, history of CVA.  Chronic heart failure with reduced ejection fraction, stage C, NYHA class II: Euvolemic Recent hospitalizations include December 2021, February 2022 Continue current medical therapy. Unable to be on ARB's, Arni, Aldactone secondary to progressive CKD. She was also on hydralazine/Isordil combination but did not able to tolerate it (felt tired, fatigued). Continue Lasix as prescribed.  Diuretics managed by nephrology given her chronic kidney disease stage IV. Strict I's and O's, and daily weights recommended. Fluid restriction to less than 2 L/day, sodium restriction to less than 1.5 g/day. Last echocardiogram from February 2022 reviewed.  We will repeat an echocardiogram in 6 months prior to the next office visit to reevaluate LVEF. EKG shows atrial paced rhythm with diffuse T wave inversions.  She denies any anginal chest discomfort.  Patient is made aware of the EKG findings.  Would like to hold off on additional cardiovascular testing at this time as she is currently asymptomatic.  If she has new onset of chest pain she is asked to go to the ED for further evaluation and management.  We will continue to monitor.  Cardiomyopathy, suggestive of nonischemic: See above  Status post BiV ICD status post ventricular  fibrillation arrest: Medtronic model DTMA1QQ Claria Quad CRT-D SureScan (serial Number [id number] )  Last device check from 03/2021 reviewed with patient and daughter.   Left bundle branch block: Continue to monitor.  Paroxysmal atrial fibrillation: Rate control: Coreg.   Rhythm control: None. Thromboembolic prophylaxis currently on Eliquis. Patient does not endorse any evidence of bleeding. CHA2DS2VASc score: 7. Annual stroke risk: 9.6% (congestive heart failure, hypertension, age, history of stroke, medical history of peripheral vascular disease, and gender)  Long-term oral anticoagulation use: Indication paroxysmal atrial fibrillation.  Patient did not endorse any evidence of bleeding. Since the patient has gained weight since last office visit recommend transitioning her back to Eliquis 5 mg p.o. twice daily. Patient and daughter are informed that if her weight goes less than 60 kg they are to call the office so the dose of Eliquis can be titrated. Outside labs 05/28/2021 independently reviewed.  Patient's hemoglobin remains relatively stable.  Mixed hyperlipidemia:  Currently on Crestor and Zetia.  We will continue to monitor.  Patient does not endorse any evidence of myalgias.  History of CVA: Educated on the importance of secondary prevention.  Will defer further management to primary and neurology.  Former smoker: Educated on the importance of continued smoking cessation.  Orders Placed This Encounter  Procedures   EKG 12-Lead   PCV ECHOCARDIOGRAM COMPLETE    --Continue cardiac medications as reconciled in final medication list. --Return in about 7 months (around 01/13/2022) for Follow up CMP.. Or sooner if needed. --Continue follow-up with your primary care physician regarding the management of your other chronic comorbid conditions.  Patient's questions and concerns were addressed to her and her daughter's satisfaction. She and her daughter voices understanding of the  instructions provided during this encounter.   This note was created using a voice recognition software as a result there may be grammatical errors inadvertently enclosed that do not reflect the nature of this encounter. Every attempt is made to correct such errors.   Rex Kras, [place], [facility]  Pager: [phone] Office: [phone]

## 2021-06-21 ENCOUNTER — Telehealth: Payer: Self-pay

## 2021-06-21 NOTE — Telephone Encounter (Signed)
Patient called and wants to know if we are still getting

## 2021-07-05 DIAGNOSIS — Z4502 Encounter for adjustment and management of automatic implantable cardiac defibrillator: Secondary | ICD-10-CM | POA: Diagnosis not present

## 2021-07-05 DIAGNOSIS — I4901 Ventricular fibrillation: Secondary | ICD-10-CM | POA: Diagnosis not present

## 2021-07-05 DIAGNOSIS — I5022 Chronic systolic (congestive) heart failure: Secondary | ICD-10-CM | POA: Diagnosis not present

## 2021-07-05 DIAGNOSIS — Z95810 Presence of automatic (implantable) cardiac defibrillator: Secondary | ICD-10-CM | POA: Diagnosis not present

## 2021-07-19 ENCOUNTER — Other Ambulatory Visit: Payer: Self-pay | Admitting: Cardiology

## 2021-07-22 ENCOUNTER — Other Ambulatory Visit: Payer: Self-pay | Admitting: Cardiology

## 2021-08-09 ENCOUNTER — Telehealth: Payer: Self-pay | Admitting: Family Medicine

## 2021-08-09 NOTE — Chronic Care Management (AMB) (Signed)
°  Chronic Care Management   Outreach Note  08/09/2021 Name: [name] MRN: [id number] DOB: 12-12-1950  Referred by: Jinny Sanders, MD Reason for referral : No chief complaint on file.   An unsuccessful telephone outreach was attempted today. The patient was referred to the pharmacist for assistance with care management and care coordination.   Follow Up Plan:   Tatjana Dellinger Upstream Scheduler

## 2021-08-11 ENCOUNTER — Other Ambulatory Visit: Payer: Self-pay | Admitting: Family Medicine

## 2021-08-12 NOTE — Telephone Encounter (Signed)
Please schedule Medicare Wellness with nurse and 40 minute CPE with fasting labs prior with Dr. Diona Browner.

## 2021-08-14 ENCOUNTER — Telehealth: Payer: Self-pay | Admitting: Family Medicine

## 2021-08-14 NOTE — Chronic Care Management (AMB) (Signed)
?  Chronic Care Management  ? ?Note ? ?08/14/2021 ?Name: [name] MRN: [id number] DOB: December 17, 1950 ? ?[name] is a 71 y.o. year old female who is a primary care patient of Bedsole, Amy E, MD. I reached out to [name] by phone today in response to a referral sent by [name]'s PCP, Jinny Sanders, MD.  ? ?[name] was given information about Chronic Care Management services today including:  ?CCM service includes personalized support from designated clinical staff supervised by her physician, including individualized plan of care and coordination with other care providers ?24/7 contact phone numbers for assistance for urgent and routine care needs. ?Service will only be billed when office clinical staff spend 20 minutes or more in a month to coordinate care. ?Only one practitioner may furnish and bill the service in a calendar month. ?The patient may stop CCM services at any time (effective at the end of the month) by phone call to the office staff. ? ? ?Patient agreed to services and verbal consent obtained.  ? ?Follow up plan: ? ? ?Tatjana Dellinger ?Upstream Scheduler  ?

## 2021-08-16 ENCOUNTER — Other Ambulatory Visit: Payer: Self-pay

## 2021-08-16 ENCOUNTER — Encounter: Payer: Self-pay | Admitting: Nurse Practitioner

## 2021-08-16 ENCOUNTER — Ambulatory Visit (INDEPENDENT_AMBULATORY_CARE_PROVIDER_SITE_OTHER): Payer: Medicare HMO | Admitting: Nurse Practitioner

## 2021-08-16 VITALS — BP 128/60 | HR 78 | Temp 97.9°F | Resp 10 | Ht 64.0 in | Wt 147.0 lb

## 2021-08-16 DIAGNOSIS — J0100 Acute maxillary sinusitis, unspecified: Secondary | ICD-10-CM

## 2021-08-16 MED ORDER — AMOXICILLIN 500 MG PO CAPS
500.0000 mg | ORAL_CAPSULE | Freq: Two times a day (BID) | ORAL | 0 refills | Status: AC
Start: 2021-08-16 — End: 2021-08-23

## 2021-08-16 NOTE — Assessment & Plan Note (Signed)
Patient's symptoms and exam cystoscopy with acute sinusitis.  Patient has a history of allergies patient is tolerating amoxicillin well.  Given patient's GFR is close to 15 we will decrease the dose to 500 mg twice daily for 7 days.  Discussed signs and symptoms when to be seen urgent or emergently.  Follow-up if no improvement in symptoms. ?

## 2021-08-16 NOTE — Patient Instructions (Signed)
Nice to see you today ?I sent medication to your pharmacy ?Follow up if symptoms fail to improve or get worse ?

## 2021-08-16 NOTE — Progress Notes (Signed)
Acute Office Visit  Subjective:    Patient ID: [name], female    DOB: 1950-08-06, 71 y.o.   MRN: [id number]  Chief Complaint  Patient presents with   Cough    X 1 week, non productive, runny nose, post nasal drip, some headache, some sore throat. No fever. No SOB. Has taking Corisiden HBP. Covid test was not done.     Patient is in today for  cough  Symptoms started approx 1 week ago States that her husband has been sick but she started No covid test Pfozer x2 and one booster Does not take flu shots Has been using corisiden HBP that helps  Past Medical History:  Diagnosis Date   Allergy    Anxiety    Arnold-Chiari malformation (HCC)    Arthritis    CHF (congestive heart failure) (HCC)    Chronic systolic heart failure ([place]) 11/03/2019   Coronary artery disease    Dyspnea    Encounter for assessment of implantable cardioverter-defibrillator (ICD) 11/10/2019   GERD (gastroesophageal reflux disease)    H. pylori infection    3 years ago   H/O cardiac arrest    Hx of VT/Vfib arrest   Hyperlipidemia    ICD BiV Medtronic model [id number] [place] Quad CRT-D SureScan ICD 08/25/2019    Incontinence    Paroxysmal atrial fibrillation ([place]) 08/06/2019   Pulmonary edema 2021   Syncope and collapse    Per pt, denies passing out   Tobacco use disorder    Unspecified essential hypertension     Past Surgical History:  Procedure Laterality Date   APPENDECTOMY     ARNOLD CHIARI SURGERY     neurocranial surgery   BIV ICD INSERTION CRT-D N/A 08/25/2019   Procedure: BIV ICD INSERTION CRT-D;  Surgeon: Constance Haw, MD;  Location: [facility];  Service: Cardiovascular;  Laterality: N/A;   BLEPHAROPLASTY     [facility]   C-EYE SURGERY PROCEDURE     CARDIAC CATHETERIZATION     CHOLECYSTECTOMY     EYE SURGERY     heart failure     LEFT HEART CATH AND CORONARY ANGIOGRAPHY N/A 08/04/2019   Procedure: LEFT HEART CATH AND CORONARY ANGIOGRAPHY;  Surgeon: Adrian Prows, MD;   Location: [facility];  Service: Cardiovascular;  Laterality: N/A;   MASS EXCISION Right 12/27/2013   Procedure: MINOR EXCISION OF RIGHT THUMB MUCOID CYST, DEBRIDEMENT OF INTERPHALANGEAL JOINT;  Surgeon: Cammie Sickle, MD;  Location: [facility];  Service: Orthopedics;  Laterality: Right;   mass on thumb  right   TOTAL KNEE ARTHROPLASTY Right 02/17/2017   TOTAL KNEE ARTHROPLASTY Right 02/17/2017   Procedure: TOTAL KNEE ARTHROPLASTY;  Surgeon: Melrose Nakayama, MD;  Location: [facility];  Service: Orthopedics;  Laterality: Right;   TUBAL LIGATION      Family History  Problem Relation Age of Onset   Bone cancer Mother    Heart disease Mother    Cancer Sister        metastatic; unknown primary   Diverticulosis Sister    Tuberculosis Father    Hypertension Sister    Colon cancer Neg Hx    Esophageal cancer Neg Hx    Pancreatic cancer Neg Hx    Stomach cancer Neg Hx    Liver disease Neg Hx    Kidney disease Neg Hx    Rectal cancer Neg Hx    Pancreatic disease Neg Hx     Social History  Socioeconomic History   Marital status: Married    Spouse name: Not on file   Number of children: 2   Years of education: Not on file   Highest education level: Not on file  Occupational History   Occupation: hair dresser  Tobacco Use   Smoking status: Former    Packs/day: 1.00    Years: 50.00    Pack years: 50.00    Types: Cigarettes    Quit date: 08/08/2019    Years since quitting: 2.0   Smokeless tobacco: Never  Vaping Use   Vaping Use: Never used  Substance and Sexual Activity   Alcohol use: No    Alcohol/week: 0.0 standard drinks   Drug use: No   Sexual activity: Not on file  Other Topics Concern   Not on file  Social History Narrative   Not on file   Social Determinants of Health   Financial Resource Strain: Not on file  Food Insecurity: Not on file  Transportation Needs: Not on file  Physical Activity: Not on file  Stress: Not on file  Social  Connections: Not on file  Intimate Partner Violence: Not on file    Outpatient Medications Prior to Visit  Medication Sig Dispense Refill   acetaminophen (TYLENOL) 500 MG tablet Take 1,000 mg by mouth 2 (two) times daily as needed (pain).     apixaban (ELIQUIS) 5 MG TABS tablet Take 1 tablet (5 mg total) by mouth 2 (two) times daily. 180 tablet 1   aspirin 81 MG chewable tablet Chew 1 tablet (81 mg total) by mouth daily. 30 tablet 0   carvedilol (COREG) 3.125 MG tablet TAKE 1 TABLET (3.125 MG TOTAL) BY MOUTH 2 (TWO) TIMES DAILY WITH A MEAL. 180 tablet 1   Cholecalciferol (VITAMIN D3 PO) Take 1,000 Units by mouth in the morning and at bedtime.     ezetimibe (ZETIA) 10 MG tablet TAKE 1 TABLET BY MOUTH EVERY DAY 90 tablet 1   famotidine (PEPCID) 20 MG tablet TAKE 1 TABLET BY MOUTH TWICE A DAY 180 tablet 1   furosemide (LASIX) 20 MG tablet TAKE 1 TABLET BY MOUTH TWICE A DAY (Patient taking differently: Take 20 mg by mouth daily.) 180 tablet 1   Magnesium 400 MG TABS Take 400 mg by mouth 2 (two) times daily.      rosuvastatin (CRESTOR) 10 MG tablet TAKE 1 TABLET (10 MG TOTAL) BY MOUTH DAILY AT 6 PM. 90 tablet 1   sertraline (ZOLOFT) 50 MG tablet TAKE 1 TABLET BY MOUTH EVERYDAY AT BEDTIME 90 tablet 0   vitamin B-12 (CYANOCOBALAMIN) 1000 MCG tablet Take 1,000 mcg by mouth daily.     nitroGLYCERIN (NITROSTAT) 0.4 MG SL tablet Place 1 tablet (0.4 mg total) under the tongue every 5 (five) minutes as needed for chest pain. 30 tablet 3   No facility-administered medications prior to visit.    Allergies  Allergen Reactions   Shellfish-Derived Products Anaphylaxis   Ativan [Lorazepam] Other (See Comments)    Makes "skin crawl" and insomnia   Buspar [Buspirone] Nausea Only    Sweating and dizzy   Clarithromycin Swelling   Codeine Nausea And Vomiting   Doxycycline Other (See Comments)    Unknown   Guaifenesin Other (See Comments)    Palpitations   Oxycodone Nausea And Vomiting   Prednisone  Nausea And Vomiting and Other (See Comments)    Makes my heart race   Statins Other (See Comments)    Muscle pain   Sulfonamide Derivatives  Nausea And Vomiting    Achiness   Tetracycline Nausea Only   Vicodin [Hydrocodone-Acetaminophen] Nausea And Vomiting   Famotidine Rash   Latex Rash   Omeprazole Nausea Only    Cough, shortness of breath - patient doesn't remember   Peanut-Containing Drug Products Itching and Rash   Protonix [Pantoprazole] Rash   Wellbutrin [Bupropion] Palpitations    Review of Systems  Constitutional:  Negative for appetite change, chills, fatigue and fever.  HENT:  Positive for congestion, ear pain, sinus pressure and sore throat (has improved). Negative for ear discharge.   Respiratory:  Positive for cough (productive) and shortness of breath (doe baseline per patient).   Cardiovascular:  Negative for chest pain.  Gastrointestinal:  Negative for diarrhea, nausea and vomiting.  Musculoskeletal:  Negative for arthralgias and myalgias.  Neurological:  Positive for headaches.      Objective:    Physical Exam Vitals and nursing note reviewed.  Constitutional:      Appearance: Normal appearance.  HENT:     Right Ear: Tympanic membrane, ear canal and external ear normal. There is no impacted cerumen.     Left Ear: Tympanic membrane, ear canal and external ear normal. There is no impacted cerumen.     Nose:     Right Sinus: No maxillary sinus tenderness or frontal sinus tenderness.     Left Sinus: Maxillary sinus tenderness present. No frontal sinus tenderness.     Mouth/Throat:     Mouth: Mucous membranes are moist.     Pharynx: Oropharynx is clear.  Cardiovascular:     Rate and Rhythm: Normal rate and regular rhythm.     Heart sounds: Normal heart sounds.  Pulmonary:     Effort: Pulmonary effort is normal.     Breath sounds: Normal breath sounds.  Musculoskeletal:     Right lower leg: No edema.     Left lower leg: No edema.  Lymphadenopathy:      Cervical: No cervical adenopathy.  Neurological:     Mental Status: She is alert.    BP 128/60    Pulse 78    Temp 97.9 F (36.6 C)    Resp 10    Ht 5\' 4"  (1.626 m)    Wt 147 lb (66.7 kg)    SpO2 98%    BMI 25.23 kg/m  Wt Readings from Last 3 Encounters:  08/16/21 147 lb (66.7 kg)  06/18/21 144 lb 3.2 oz (65.4 kg)  02/15/21 135 lb 8 oz (61.5 kg)    Health Maintenance Due  Topic Date Due   Pneumonia Vaccine 85+ Years old (1 - PCV) Never done   Hepatitis C Screening  Never done   TETANUS/TDAP  Never done   Zoster Vaccines- Shingrix (1 of 2) Never done   DEXA SCAN  Never done   COLONOSCOPY (Pts 45-77yrs Insurance coverage will need to be confirmed)  07/08/2018   MAMMOGRAM  07/26/2020   COVID-19 Vaccine (4 - Booster for [place] series) 11/30/2020    There are no preventive care reminders to display for this patient.   Lab Results  Component Value Date   TSH 1.487 12/07/2019   Lab Results  Component Value Date   WBC 6.0 07/30/2020   HGB 11.2 (L) 07/30/2020   HCT 34.5 (L) 07/30/2020   MCV 91.8 07/30/2020   PLT 196 07/30/2020   Lab Results  Component Value Date   NA 142 10/25/2020   K 4.3 10/25/2020   CO2 28 10/25/2020   GLUCOSE  89 10/25/2020   BUN 29 (H) 10/25/2020   CREATININE 2.60 (H) 10/25/2020   BILITOT 1.1 07/30/2020   ALKPHOS 64 07/30/2020   AST 25 07/30/2020   ALT 11 07/30/2020   PROT 7.2 07/30/2020   ALBUMIN 4.2 07/30/2020   CALCIUM 9.1 10/25/2020   ANIONGAP 12 08/03/2020   GFR 18.16 (L) 10/25/2020   Lab Results  Component Value Date   CHOL 114 09/21/2019   Lab Results  Component Value Date   HDL 33 (L) 09/21/2019   Lab Results  Component Value Date   LDLCALC 55 09/21/2019   Lab Results  Component Value Date   TRIG 147 09/21/2019   Lab Results  Component Value Date   CHOLHDL 7.5 08/03/2019   Lab Results  Component Value Date   HGBA1C 5.7 (H) 08/03/2019       Assessment & Plan:   Problem List Items Addressed This Visit        Respiratory   Acute non-recurrent maxillary sinusitis - Primary    Patient's symptoms and exam cystoscopy with acute sinusitis.  Patient has a history of allergies patient is tolerating amoxicillin well.  Given patient's GFR is close to 15 we will decrease the dose to 500 mg twice daily for 7 days.  Discussed signs and symptoms when to be seen urgent or emergently.  Follow-up if no improvement in symptoms.      Relevant Medications   amoxicillin (AMOXIL) 500 MG capsule     Meds ordered this encounter  Medications   amoxicillin (AMOXIL) 500 MG capsule    Sig: Take 1 capsule (500 mg total) by mouth 2 (two) times daily for 7 days.    Dispense:  14 capsule    Refill:  0    Dose reduction due to kidney function    Order Specific Question:   Supervising Provider    Answer:   Loura Pardon A [[id number]]   This visit occurred during the SARS-CoV-2 public health emergency.  Safety protocols were in place, including screening questions prior to the visit, additional usage of staff PPE, and extensive cleaning of exam room while observing appropriate contact time as indicated for disinfecting solutions.   Romilda Garret, NP

## 2021-08-20 DIAGNOSIS — H25013 Cortical age-related cataract, bilateral: Secondary | ICD-10-CM | POA: Diagnosis not present

## 2021-08-20 DIAGNOSIS — H5213 Myopia, bilateral: Secondary | ICD-10-CM | POA: Diagnosis not present

## 2021-08-20 DIAGNOSIS — H524 Presbyopia: Secondary | ICD-10-CM | POA: Diagnosis not present

## 2021-08-20 DIAGNOSIS — H52203 Unspecified astigmatism, bilateral: Secondary | ICD-10-CM | POA: Diagnosis not present

## 2021-08-20 DIAGNOSIS — H2513 Age-related nuclear cataract, bilateral: Secondary | ICD-10-CM | POA: Diagnosis not present

## 2021-08-22 DIAGNOSIS — Z0100 Encounter for examination of eyes and vision without abnormal findings: Secondary | ICD-10-CM | POA: Diagnosis not present

## 2021-08-22 DIAGNOSIS — H524 Presbyopia: Secondary | ICD-10-CM | POA: Diagnosis not present

## 2021-09-09 ENCOUNTER — Telehealth: Payer: Self-pay

## 2021-09-09 NOTE — Chronic Care Management (AMB) (Signed)
? ? ?  Chronic Care Management ?Pharmacy Assistant  ? ?Name: [name]  MRN: [id number] DOB: August 06, 1950 ? ?[name] is an 71 y.o. year old female who presents for his initial CCM visit with the clinical pharmacist. ? ?Reason for Encounter: Initial Questions ?  ?Conditions to be addressed/monitored: ?CAD, HTN, HLD, and CKD Stage 3 ? ?Recent office visits:  ?08/16/21-Family Medicine-James Cable,NP-acute maxillary sinusitis- start amoxicillin '500mg'$  take 1 capsule 2 times daily for 7 days  ? ?Recent consult visits:  ?06/18/21-Cardiology-Sunit Tolia,DO- follow up CHF-daily weights recommended.Since the patient has gained weight since last office visit recommend transitioning her back to Eliquis 5 mg p.o. twice daily.EKG,ECG ?05/28/21-Internal Medicine- no data found ?04/05/21-Cardiology-no data found ? ?Hospital visits:  ?None in previous 6 months ? ?Medications: ?Outpatient Encounter Medications as of 09/09/2021  ?Medication Sig  ? acetaminophen (TYLENOL) 500 MG tablet Take 1,000 mg by mouth 2 (two) times daily as needed (pain).  ? apixaban (ELIQUIS) 5 MG TABS tablet Take 1 tablet (5 mg total) by mouth 2 (two) times daily.  ? aspirin 81 MG chewable tablet Chew 1 tablet (81 mg total) by mouth daily.  ? carvedilol (COREG) 3.125 MG tablet TAKE 1 TABLET (3.125 MG TOTAL) BY MOUTH 2 (TWO) TIMES DAILY WITH A MEAL.  ? Cholecalciferol (VITAMIN D3 PO) Take 1,000 Units by mouth in the morning and at bedtime.  ? ezetimibe (ZETIA) 10 MG tablet TAKE 1 TABLET BY MOUTH EVERY DAY  ? famotidine (PEPCID) 20 MG tablet TAKE 1 TABLET BY MOUTH TWICE A DAY  ? furosemide (LASIX) 20 MG tablet TAKE 1 TABLET BY MOUTH TWICE A DAY (Patient taking differently: Take 20 mg by mouth daily.)  ? Magnesium 400 MG TABS Take 400 mg by mouth 2 (two) times daily.   ? rosuvastatin (CRESTOR) 10 MG tablet TAKE 1 TABLET (10 MG TOTAL) BY MOUTH DAILY AT 6 PM.  ? sertraline (ZOLOFT) 50 MG tablet TAKE 1 TABLET BY MOUTH EVERYDAY AT BEDTIME  ? vitamin B-12  (CYANOCOBALAMIN) 1000 MCG tablet Take 1,000 mcg by mouth daily.  ? ?No facility-administered encounter medications on file as of 09/09/2021.  ? ? ?Lab Results  ?Component Value Date/Time  ? HGBA1C 5.7 (H) 08/03/2019 02:49 AM  ?  ? ?BP Readings from Last 3 Encounters:  ?08/16/21 128/60  ?06/18/21 119/63  ?02/15/21 92/60  ? ? ? ? ?Patient contacted to confirm telephone appointment with Charlene Brooke, Pharm D, on 09/12/21 at 1:30pm. ? Spoke with Daughter [name] [phone] ?Do you have any problems getting your medications? No ? ?What is your top health concern you would like to discuss at your upcoming visit? No health concerns at this time -Patient stays active at the house ? ?Have you seen any other providers since your last visit with PCP? No ? ? ? ?Star Rating Drugs:  ?Medication:  Last Fill: Day Supply ?Rosuvastatin '10mg'$  07/19/21  90 ? ? ?Care Gaps: ?Annual wellness visit in last year? No ?Most Recent BP reading:128/60  78-P ? ? ?Charlene Brooke, CPP notified ? ?Tyreon Frigon, CCMA ?Health concierge  ?[phone]  ?

## 2021-09-12 ENCOUNTER — Ambulatory Visit (INDEPENDENT_AMBULATORY_CARE_PROVIDER_SITE_OTHER): Payer: Medicare HMO | Admitting: Pharmacist

## 2021-09-12 DIAGNOSIS — I5020 Unspecified systolic (congestive) heart failure: Secondary | ICD-10-CM

## 2021-09-12 DIAGNOSIS — F4323 Adjustment disorder with mixed anxiety and depressed mood: Secondary | ICD-10-CM

## 2021-09-12 DIAGNOSIS — N184 Chronic kidney disease, stage 4 (severe): Secondary | ICD-10-CM

## 2021-09-12 DIAGNOSIS — I480 Paroxysmal atrial fibrillation: Secondary | ICD-10-CM

## 2021-09-12 DIAGNOSIS — I10 Essential (primary) hypertension: Secondary | ICD-10-CM

## 2021-09-12 DIAGNOSIS — Z8673 Personal history of transient ischemic attack (TIA), and cerebral infarction without residual deficits: Secondary | ICD-10-CM

## 2021-09-12 DIAGNOSIS — E782 Mixed hyperlipidemia: Secondary | ICD-10-CM

## 2021-09-12 NOTE — Progress Notes (Signed)
? ?Chronic Care Management ?Pharmacy Note ? ?09/13/2021 ?Name:  [name] MRN:  [id number] DOB:  [birth year] ? ?Summary: CCM Initial visit ?-Pt reports dizziness every day after taking medications; she does not check BP at these times ?-Pt reports trouble sleeping, counseled on sleep hygiene ? ?Recommendations/Changes made from today's visit: ?-Check BP 1-2 times a day to monitor for hypotension ?-Try melatonin 10 mg HS ?  ?Plan: ?-PCP CPE 10/04/21 ?-[facility] will call patient 2 months for BP update ?-Pharmacist follow up televisit scheduled for 3 months ? ? ?Subjective: ?[name] is an 71 y.o. year old female who is a primary patient of Bedsole, Amy E, MD.  The CCM team was consulted for assistance with disease management and care coordination needs.   ? ?Engaged with patient by telephone for initial visit in response to provider referral for pharmacy case management and/or care coordination services.  ? ?Consent to Services:  ?The patient was given the following information about Chronic Care Management services today, agreed to services, and gave verbal consent: 1. CCM service includes personalized support from designated clinical staff supervised by the primary care provider, including individualized plan of care and coordination with other care providers 2. 24/7 contact phone numbers for assistance for urgent and routine care needs. 3. Service will only be billed when office clinical staff spend 20 minutes or more in a month to coordinate care. 4. Only one practitioner may furnish and bill the service in a calendar month. 5.The patient may stop CCM services at any time (effective at the end of the month) by phone call to the office staff. 6. The patient will be responsible for cost sharing (co-pay) of up to 20% of the service fee (after annual deductible is met). Patient agreed to services and consent obtained. ? ?Patient Care Team: ?Jinny Sanders, MD as PCP - General (Family  Medicine) ?Ria Bush, MD (Family Medicine) ?Rex Kras, DO as Consulting Physician (Cardiology) ?Landers Prajapati, Cleaster Corin, [facility] as Pharmacist (Pharmacist) ? ?Recent office visits: ?08/16/21-Family Medicine-James Cable,NP-acute maxillary sinusitis- start amoxicillin $RemoveBeforeDEI'500mg'[id number]$  take 1 capsule 2 times daily for 7 days  ? ?02/15/21 Dr Diona Browner OV: pyrogenic granuloma. Advised Dr Allyson Sabal for laser/surgical removal ? ?Recent consult visits: ?08/20/21 Dr Gershon Crane ([place]): Cataract eval. Progressed. Pt does not want surgery. ? ?06/18/21 Dr Terri Skains (Cardiology): follow up CHF-daily weights recommended.Since the patient has gained weight since last office visit recommend transitioning her back to Eliquis 5 mg p.o. twice daily.EKG,ECG ? ?05/28/21 Dr Moshe Cipro (Nephrology): f/u CKD stage 4. Renal function is stabilizing. Mild fluid restriction recommended. ? ?Hospital visits: ?None in previous 6 months ? ? ?Objective: ? ?Lab Results  ?Component Value Date  ? CREATININE 2.60 (H) 10/25/2020  ? BUN 29 (H) 10/25/2020  ? GFR 18.16 (L) 10/25/2020  ? GFRNONAA 15 (L) 08/03/2020  ? GFRAA 20 (L) 03/06/2020  ? NA 142 10/25/2020  ? K 4.3 10/25/2020  ? CALCIUM 9.1 10/25/2020  ? CO2 28 10/25/2020  ? GLUCOSE 89 10/25/2020  ? ? ?Lab Results  ?Component Value Date/Time  ? HGBA1C 5.7 (H) 08/03/2019 02:49 AM  ? GFR 18.16 (L) 10/25/2020 03:21 PM  ? GFR 14.96 (LL) 08/24/2020 01:54 PM  ?  ?Last diabetic Eye exam: No results found for: HMDIABEYEEXA  ?Last diabetic Foot exam: No results found for: HMDIABFOOTEX  ? ?Lab Results  ?Component Value Date  ? CHOL 114 09/21/2019  ? HDL 33 (L) 09/21/2019  ? [place] 55 09/21/2019  ? LDLDIRECT 193.0  09/02/2016  ? TRIG 147 09/21/2019  ? CHOLHDL 7.5 08/03/2019  ? ? ? ?  Latest Ref Rng & Units 07/30/2020  ?  5:04 PM 07/27/2020  ?  1:01 PM 04/14/2020  ?  5:29 AM  ?Hepatic Function  ?Total Protein 6.5 - 8.1 g/dL 7.2   6.9   6.6    ?Albumin 3.5 - 5.0 g/dL 4.2   4.1   3.9    ?AST 15 - 41 U/L 25   14   19     ?ALT 0 - 44 U/L  11   5   9     ?Alk Phosphatase 38 - 126 U/L 64   48   55    ?Total Bilirubin 0.3 - 1.2 mg/dL 1.1   0.4   0.5    ? ? ?Lab Results  ?Component Value Date/Time  ? TSH 1.487 12/07/2019 12:34 PM  ? TSH 0.550 08/24/2019 09:42 AM  ? TSH 0.75 08/23/2019 01:15 PM  ? TSH 0.66 09/16/2017 10:33 AM  ? FREET4 0.89 03/26/2015 04:39 PM  ? FREET4 0.94 10/02/2014 10:49 AM  ? ? ? ?  Latest Ref Rng & Units 07/30/2020  ?  5:04 PM 06/12/2020  ?  2:56 AM 04/23/2020  ? 12:52 PM  ?CBC  ?WBC 4.0 - 10.5 K/uL 6.0   6.0   5.4    ?Hemoglobin 12.0 - 15.0 g/dL 11.2   10.6   10.7    ?Hematocrit 36.0 - 46.0 % 34.5   32.4   32.8    ?Platelets 150 - 400 K/uL 196   173   223.0    ? ? ?Lab Results  ?Component Value Date/Time  ? VD25OH 40.62 07/27/2020 01:01 PM  ? VD25OH 34.89 04/23/2020 12:52 PM  ? ? ?Clinical ASCVD: Yes  ?The ASCVD Risk score (Arnett DK, et al., 2019) failed to calculate for the following reasons: ?  The patient has a prior MI or stroke diagnosis   ? ? ?  07/27/2020  ?  1:06 PM 04/23/2020  ? 12:11 PM 04/23/2020  ? 12:10 PM  ?Depression screen PHQ 2/9  ?Decreased Interest 1 1 1   ?Down, Depressed, Hopeless 1 0 0  ?PHQ - 2 Score 2 1 1   ?Altered sleeping 0 0   ?Tired, decreased energy 2 3   ?Change in appetite 2 3   ?Feeling bad or failure about yourself  0 0   ?Trouble concentrating 0 0   ?Moving slowly or fidgety/restless 0 0   ?Suicidal thoughts 0 0   ?PHQ-9 Score 6 7   ?Difficult doing work/chores  Somewhat difficult   ?  ?CHA2DS2/VAS Stroke Risk Points  Current as of yesterday  ?   7 >= 2 Points: High Risk  ?1 - 1.99 Points: Medium Risk  ?0 Points: Low Risk  ?  Last Change: N/A   ?  ? Points Metrics  ?1 Has Congestive Heart Failure:  Yes   ? Current as of yesterday  ?1 Has Vascular Disease:  Yes   ? Current as of yesterday  ?1 Has Hypertension:  Yes   ? Current as of yesterday  ?1 Age:  89   ? Current as of yesterday  ?0 Has Diabetes:  No   ? Current as of yesterday  ?2 Had Stroke:  Yes  Had TIA:  Yes  Had Thromboembolism:  No   ? Current  as of yesterday  ?1 Female:  Yes   ? Current as of yesterday  ? ? ?  Social History  ? ?Tobacco Use  ?Smoking Status Former  ? Packs/day: 1.00  ? Years: 50.00  ? Pack years: 50.00  ? Types: Cigarettes  ? Quit date: 08/08/2019  ? Years since quitting: 2.1  ?Smokeless Tobacco Never  ? ?BP Readings from Last 3 Encounters:  ?08/16/21 128/60  ?06/18/21 119/63  ?02/15/21 92/60  ? ?Pulse Readings from Last 3 Encounters:  ?08/16/21 78  ?06/18/21 75  ?02/15/21 82  ? ?Wt Readings from Last 3 Encounters:  ?08/16/21 147 lb (66.7 kg)  ?06/18/21 144 lb 3.2 oz (65.4 kg)  ?02/15/21 135 lb 8 oz (61.5 kg)  ? ?BMI Readings from Last 3 Encounters:  ?08/16/21 25.23 kg/m?  ?06/18/21 24.75 kg/m?  ?02/15/21 23.26 kg/m?  ? ? ?Assessment/Interventions: Review of patient past medical history, allergies, medications, health status, including review of consultants reports, laboratory and other test data, was performed as part of comprehensive evaluation and provision of chronic care management services.  ? ?SDOH:  (Social Determinants of Health) assessments and interventions performed: Yes ?SDOH Interventions   ? ?Flowsheet Row Most Recent Value  ?SDOH Interventions   ?Food Insecurity Interventions Intervention Not Indicated  ?Financial Strain Interventions Intervention Not Indicated  ? ?  ? ?SDOH Screenings  ? ?Alcohol Screen: Not on file  ?Depression (PHQ2-9): Not on file  ?Financial Resource Strain: Low Risk   ? Difficulty of Paying Living Expenses: Not very hard  ?Food Insecurity: No Food Insecurity  ? Worried About Charity fundraiser in the Last Year: Never true  ? Ran Out of Food in the Last Year: Never true  ?Housing: Not on file  ?Physical Activity: Not on file  ?Social Connections: Not on file  ?Stress: Not on file  ?Tobacco Use: Medium Risk  ? Smoking Tobacco Use: Former  ? Smokeless Tobacco Use: Never  ? Passive Exposure: Not on file  ?Transportation Needs: Not on file  ? ? ?[place] ? ?Allergies  ?Allergen Reactions  ?  Shellfish-Derived Products Anaphylaxis  ? Ativan [Lorazepam] Other (See Comments)  ?  Makes "skin crawl" and insomnia  ? Buspar [Buspirone] Nausea Only  ?  Sweating and dizzy  ? Clarithromycin Swelling  ? Codeine Nausea And Vo

## 2021-09-13 DIAGNOSIS — N184 Chronic kidney disease, stage 4 (severe): Secondary | ICD-10-CM | POA: Diagnosis not present

## 2021-09-13 DIAGNOSIS — I5040 Unspecified combined systolic (congestive) and diastolic (congestive) heart failure: Secondary | ICD-10-CM

## 2021-09-13 DIAGNOSIS — I4891 Unspecified atrial fibrillation: Secondary | ICD-10-CM | POA: Diagnosis not present

## 2021-09-13 DIAGNOSIS — E785 Hyperlipidemia, unspecified: Secondary | ICD-10-CM

## 2021-09-13 DIAGNOSIS — F4320 Adjustment disorder, unspecified: Secondary | ICD-10-CM

## 2021-09-13 DIAGNOSIS — I130 Hypertensive heart and chronic kidney disease with heart failure and stage 1 through stage 4 chronic kidney disease, or unspecified chronic kidney disease: Secondary | ICD-10-CM

## 2021-09-13 DIAGNOSIS — R69 Illness, unspecified: Secondary | ICD-10-CM | POA: Diagnosis not present

## 2021-09-13 NOTE — Patient Instructions (Addendum)
Visit Information ? ?Phone number for Pharmacist: [phone] ? ?Thank you for meeting with me to discuss your medications! I look forward to working with you to achieve your health care goals. Below is a summary of what we talked about during the visit: ? ? Goals Addressed   ? ?  ?  ?  ?  ? This Visit's Progress  ?  Track and Manage My Blood Pressure-Hypertension     ?  Timeframe:  Long-Range Goal ?Priority:  High ?Start Date:     09/13/21                        ?Expected End Date:    09/14/22                  ? ?Follow Up Date June 2023 ?  ?- check blood pressure daily ?- choose a place to take my blood pressure (home, clinic or office, retail store) ?- write blood pressure results in a log or diary  ?  ?Why is this important?   ?You won't feel high blood pressure, but it can still hurt your blood vessels.  ?High blood pressure can cause heart or kidney problems. It can also cause a stroke.  ?Making lifestyle changes like losing a little weight or eating less salt will help.  ?Checking your blood pressure at home and at different times of the day can help to control blood pressure.  ?If the doctor prescribes medicine remember to take it the way the doctor ordered.  ?Call the office if you cannot afford the medicine or if there are questions about it.   ?  ?Notes:  ?  ? ?  ? ? ?Care Plan : [place]  ?Updates made by Charlton Haws, RPH since 09/13/2021 12:00 AM  ?  ? ?Problem: Hypertension, Hyperlipidemia, Atrial Fibrillation, Heart Failure, Chronic Kidney Disease, and Depression/Insomnia   ?Priority: High  ?  ? ?Long-Range Goal: Disease mgmt   ?Start Date: 09/13/2021  ?Expected End Date: 09/14/2022  ?This Visit's Progress: On track  ?Priority: High  ?Note:   ?Current Barriers:  ?Unable to independently monitor therapeutic efficacy ? ?Pharmacist Clinical Goal(s):  ?Patient will achieve adherence to monitoring guidelines and medication adherence to achieve therapeutic efficacy through collaboration  with PharmD and provider.  ? ?Interventions: ?1:1 collaboration with Jinny Sanders, MD regarding development and update of comprehensive plan of care as evidenced by provider attestation and co-signature ?Inter-disciplinary care team collaboration (see longitudinal plan of care) ?Comprehensive medication review performed; medication list updated in electronic medical record ? ?Hypertension / Heart Failure (BP goal <130/80) ?-Not ideally controlled - pt reports dizziness every single day after taking her medications in AM; she denies any swelling on furosemide daily ?-Last ejection fraction: 25-30% (Date: 08/01/20) ?-HF type: Combined Systolic and Diastolic; NYHA Class: II  ?-s/p BiV ICD s/p Vfib arrest 2021 ?-Current home BP/HR readings: none available ?-Current treatment: ?Carvedilol 3.125 mg BID -Appropriate, Effective, Query Safe ?Furosemide 20 mg AM -Appropriate, Effective, Query Safe ?-Medications previously tried: hydralazine/nitrate - fatigue  ?-Cannot be on ACE/ARB, ARNI, spironolactone due to CKD ?-Educated on BP goals and benefits of medications for prevention of heart attack, stroke and kidney damage; Importance of home blood pressure monitoring; Symptoms of hypotension and importance of maintaining adequate hydration; ?-Educated on Importance of weighing daily; if you gain more than 3 pounds in one day or 5 pounds in one week, contact provider ?-Counseled to monitor  BP at home daily, document, and provide log at future appointments ?-Recommended to continue current medication ? ?Hyperlipidemia / CAD (LDL goal < 70) ?-Controlled - LDL 55 (09/2019) at goal tough has not been checked in 2 years ?-Hx CAD, CVA ?-Current treatment: ?Rosuvastatin 10 mg daily - Appropriate, Effective, Safe, Accessible ?Ezetimibe 10 mg daily PM - Appropriate, Effective, Safe, Accessible ?Aspirin 81 mg daily AM -Appropriate, Effective, Safe, Accessible ?-Medications previously tried: n/a  ?-Educated on Cholesterol goals; Benefits  of statin for ASCVD risk reduction; ?-Recommended to continue current medication ? ?Atrial Fibrillation (Goal: prevent stroke and major bleeding) ?-Controlled - pt endorses compliance with Eliquis and denies s/sx of bleeding; she reports Eliquis is expensive but reasonable ?-CHADSVASC: 7 ?-Cr 2.6, age 71, weight 66 kg weight 66 kg ?-Current treatment: ?Carvedilol 3.125 mg BID - Appropriate, Effective, Safe, Accessible ?Eliquis 5 mg BID -Appropriate, Effective, Safe, Accessible ?-Medications previously tried: n/a ?-Counseled on increased risk of stroke due to Afib and benefits of anticoagulation for stroke prevention; importance of adherence to anticoagulant exactly as prescribed; bleeding risk associated with Eliquis and importance of self-monitoring for signs/symptoms of bleeding; avoidance of NSAIDs due to increased bleeding risk with anticoagulants; ?-Recommended to continue current medication ? ?Adjustment disorder (Goal: manage symptoms) ?-Not ideally controlled - pt reports struggling with mental health for over a decade, starting when her husband had a "psychotic break" and had to be institutionalized for 7 months - husband has been home for several years now and relatively stable, but pt still has flashbacks regarding this ?-Pt reports trouble falling asleep in last few months  ?-Bedtime routine: burst of energy 9pm - cleaning house. In bed by 11 pm. She does takes naps in daytime some days 3-4 pm; she has tried melatonin before but does not remember which dose ?-PHQ9: 6 (07/2020) - mild depression ?-Connected with PCP for mental health support ?-Current treatment: ?Sertraline 50 mg daily 8pm - Appropriate, Effective, Safe, Accessible ?-Medications previously tried/failed: alprazolam (sleep), buspar, lorazepam, hydroxyzine ?-Educated on Benefits of medication for symptom control ?-Discussed sleep hygiene at length - advised avoiding screens before bed, keeping a strict bedtime routine and timing, try relaxing activities  before bed ?-Recommend melatonin 10 mg 30-60 min before bed; consider trazodone if ineffective ? ?CKD Stage 4 (Goal: slow progression) ?-Controlled - kidney function has been stable ?-Follows with Dr Moshe Cipro ?-Medication adjustments: avoid nephrotoxins (NSAIDS), avoid ACE/ARB, ARNI, AA ? ?Health Maintenance ?-Vaccine gaps: covid booster, Prevnar, Shingrix, TDAP ?-Current therapy:  ?Tylenol 500 mg ?Vitamin D 1000 IU BID ?Famotidine 20 mg AM ?Magnesium 400 mg AM ?Vitamin B12 ?-Patient is satisfied with current therapy and denies issues ?-Recommended to continue current medication ? ?Patient Goals/Self-Care Activities ?Patient will:  ?- take medications as prescribed as evidenced by patient report and record review ?focus on medication adherence by pill box ?check blood pressure daily, document, and provide at future appointments ?-Try melatonin 10 mg at bedtime ? ? ?  ?  ? ?Ms. Fraga was given information about Chronic Care Management services today including:  ?CCM service includes personalized support from designated clinical staff supervised by her physician, including individualized plan of care and coordination with other care providers ?24/7 contact phone numbers for assistance for urgent and routine care needs. ?Standard insurance, coinsurance, copays and deductibles apply for chronic care management only during months in which we provide at least 20 minutes of these services. Most insurances cover these services at 100%, however patients may be responsible for any copay, coinsurance and/or deductible if applicable. This  service may help you avoid the need for more expensive face-to-face services. ?Only one practitioner may furnish and bill the service in a calendar month. ?The patient may stop CCM services at any time (effective at the end of the month) by phone call to the office staff. ? ?Patient agreed to services and verbal consent obtained.  ? ?Patient verbalizes understanding of instructions and  care plan provided today and agrees to view in [place]. Active MyChart status confirmed with patient.   ?Telephone follow up appointment with pharmacy team member scheduled for: 3 months ? ?Donalda Ewings

## 2021-09-18 ENCOUNTER — Telehealth: Payer: Self-pay | Admitting: Family Medicine

## 2021-09-18 DIAGNOSIS — E538 Deficiency of other specified B group vitamins: Secondary | ICD-10-CM

## 2021-09-18 DIAGNOSIS — Z1159 Encounter for screening for other viral diseases: Secondary | ICD-10-CM

## 2021-09-18 DIAGNOSIS — E559 Vitamin D deficiency, unspecified: Secondary | ICD-10-CM

## 2021-09-18 DIAGNOSIS — E782 Mixed hyperlipidemia: Secondary | ICD-10-CM

## 2021-09-18 NOTE — Telephone Encounter (Signed)
-----   Message from Velna Hatchet, RT sent at 09/10/2021  4:17 PM EDT ----- ?Regarding: Lab orders for appt on Friday, 09/27/21 ?Patient is scheduled for cpx, please order future labs.  Thanks, Anda Kraft  ? ?

## 2021-09-27 ENCOUNTER — Other Ambulatory Visit (INDEPENDENT_AMBULATORY_CARE_PROVIDER_SITE_OTHER): Payer: Medicare HMO

## 2021-09-27 DIAGNOSIS — E538 Deficiency of other specified B group vitamins: Secondary | ICD-10-CM | POA: Diagnosis not present

## 2021-09-27 DIAGNOSIS — Z1159 Encounter for screening for other viral diseases: Secondary | ICD-10-CM | POA: Diagnosis not present

## 2021-09-27 DIAGNOSIS — E782 Mixed hyperlipidemia: Secondary | ICD-10-CM

## 2021-09-27 DIAGNOSIS — E559 Vitamin D deficiency, unspecified: Secondary | ICD-10-CM

## 2021-09-27 LAB — VITAMIN D 25 HYDROXY (VIT D DEFICIENCY, FRACTURES): VITD: 53 ng/mL (ref 30.00–100.00)

## 2021-09-27 LAB — COMPREHENSIVE METABOLIC PANEL
ALT: 8 U/L (ref 0–35)
AST: 21 U/L (ref 0–37)
Albumin: 4.5 g/dL (ref 3.5–5.2)
Alkaline Phosphatase: 57 U/L (ref 39–117)
BUN: 21 mg/dL (ref 6–23)
CO2: 29 mEq/L (ref 19–32)
Calcium: 9.2 mg/dL (ref 8.4–10.5)
Chloride: 102 mEq/L (ref 96–112)
Creatinine, Ser: 2.18 mg/dL — ABNORMAL HIGH (ref 0.40–1.20)
GFR: 22.29 mL/min — ABNORMAL LOW (ref >60.00)
Glucose, Bld: 87 mg/dL (ref 70–99)
Potassium: 4.2 mEq/L (ref 3.5–5.1)
Sodium: 140 mEq/L (ref 135–145)
Total Bilirubin: 0.5 mg/dL (ref 0.2–1.2)
Total Protein: 7.1 g/dL (ref 6.0–8.3)

## 2021-09-27 LAB — LIPID PANEL
Cholesterol: 141 mg/dL (ref 0–200)
HDL: 49.6 mg/dL (ref >39.00)
LDL Cholesterol: 71 mg/dL (ref 0–99)
NonHDL: 90.92
Total CHOL/HDL Ratio: 3
Triglycerides: 100 mg/dL (ref 0.0–149.0)
VLDL: 20 mg/dL (ref 0.0–40.0)

## 2021-09-27 LAB — VITAMIN B12: Vitamin B-12: >1504 pg/mL — ABNORMAL HIGH (ref 211–911)

## 2021-09-30 LAB — HEPATITIS C ANTIBODY
Hepatitis C Ab: NONREACTIVE
SIGNAL TO CUT-OFF: 0.1 (ref <1.00)

## 2021-10-01 NOTE — Progress Notes (Signed)
No critical labs need to be addressed urgently. We will discuss labs in detail at upcoming office visit.   

## 2021-10-04 ENCOUNTER — Ambulatory Visit (INDEPENDENT_AMBULATORY_CARE_PROVIDER_SITE_OTHER): Payer: Medicare HMO | Admitting: Family Medicine

## 2021-10-04 ENCOUNTER — Encounter: Payer: Self-pay | Admitting: Family Medicine

## 2021-10-04 VITALS — BP 128/72 | HR 72 | Temp 98.2°F | Ht 64.0 in | Wt 147.2 lb

## 2021-10-04 DIAGNOSIS — E782 Mixed hyperlipidemia: Secondary | ICD-10-CM

## 2021-10-04 DIAGNOSIS — N184 Chronic kidney disease, stage 4 (severe): Secondary | ICD-10-CM | POA: Diagnosis not present

## 2021-10-04 DIAGNOSIS — I5022 Chronic systolic (congestive) heart failure: Secondary | ICD-10-CM | POA: Diagnosis not present

## 2021-10-04 DIAGNOSIS — I4901 Ventricular fibrillation: Secondary | ICD-10-CM | POA: Diagnosis not present

## 2021-10-04 DIAGNOSIS — Z4502 Encounter for adjustment and management of automatic implantable cardiac defibrillator: Secondary | ICD-10-CM | POA: Diagnosis not present

## 2021-10-04 DIAGNOSIS — Z95810 Presence of automatic (implantable) cardiac defibrillator: Secondary | ICD-10-CM | POA: Diagnosis not present

## 2021-10-04 DIAGNOSIS — Z0000 Encounter for general adult medical examination without abnormal findings: Secondary | ICD-10-CM

## 2021-10-04 DIAGNOSIS — F320 Major depressive disorder, single episode, mild: Secondary | ICD-10-CM

## 2021-10-04 DIAGNOSIS — G4700 Insomnia, unspecified: Secondary | ICD-10-CM | POA: Diagnosis not present

## 2021-10-04 DIAGNOSIS — I480 Paroxysmal atrial fibrillation: Secondary | ICD-10-CM | POA: Diagnosis not present

## 2021-10-04 DIAGNOSIS — Z23 Encounter for immunization: Secondary | ICD-10-CM | POA: Diagnosis not present

## 2021-10-04 DIAGNOSIS — I5020 Unspecified systolic (congestive) heart failure: Secondary | ICD-10-CM

## 2021-10-04 DIAGNOSIS — I10 Essential (primary) hypertension: Secondary | ICD-10-CM | POA: Diagnosis not present

## 2021-10-04 DIAGNOSIS — R69 Illness, unspecified: Secondary | ICD-10-CM | POA: Diagnosis not present

## 2021-10-04 DIAGNOSIS — I428 Other cardiomyopathies: Secondary | ICD-10-CM | POA: Diagnosis not present

## 2021-10-04 NOTE — Progress Notes (Signed)
Patient ID: [name], female    DOB: August 23, 1950, 71 y.o.   MRN: [id number]  This visit was conducted in person.  BP 128/72 (BP Location: Right Arm, Patient Position: Sitting, Cuff Size: Normal)   Pulse 72   Temp 98.2 F (36.8 C) (Oral)   Ht '5\' 4"'$  (1.626 m)   Wt 147 lb 3.2 oz (66.8 kg)   SpO2 98%   BMI 25.27 kg/m    CC:  Chief Complaint  Patient presents with   Medicare Wellness    Subjective:   HPI: [name] is a 71 y.o. female presenting on 10/04/2021 for Medicare Wellness  The patient presents for annual medicare wellness, complete physical and review of chronic health problems. He/She also has the following acute concerns today:  I have personally reviewed the Medicare Annual Wellness questionnaire and have noted 1. The patient's medical and social history 2. Their use of alcohol, tobacco or illicit drugs 3. Their current medications and supplements 4. The patient's functional ability including ADL's, fall risks, home safety risks and hearing or visual             impairment. 5. Diet and physical activities 6. Evidence for depression or mood disorders 7.         Updated provider list Cognitive evaluation was performed and recorded on pt medicare questionnaire form. The patients weight, height, BMI and visual acuity have been recorded in the chart   I have made referrals, counseling and provided education to the patient based review of the above and I have provided the pt with a written personalized care plan for preventive services.   Documentation of this information was scanned into the electronic record under the media tab.   Advance directives and end of life planning reviewed in detail with patient and documented in EMR. Patient given handout on advance care directives if needed. HCPOA and living will updated if needed.  No falls in last 12 months.  Hearing and vision screening performed and results recorded in epic.  [place] Visit  from 10/04/2021 in [facility]  PHQ-2 Total Score 0      Hypertension:  Well controlled   BP Readings from Last 3 Encounters:  10/04/21 128/72  08/16/21 128/60  06/18/21 119/63  Using medication without problems or lightheadedness:  none Chest pain with exertion: none Edema:none Short of breath: none Average home BPs: Other issues:   HFrEF, atrial fibrillation followed by cardiology: on eliquis 5 mg BID  for anticoagulation and rate controlled with coreg 3.125 mg BID   Elevated Cholesterol: Almost at goal on  zetia 10 mg daily and crestor 10 mg daily  Lab Results  Component Value Date   CHOL 141 09/27/2021   HDL 49.60 09/27/2021   LDLCALC 71 09/27/2021   LDLDIRECT 193.0 09/02/2016   TRIG 100.0 09/27/2021   CHOLHDL 3 09/27/2021  Using medications without problems: none Muscle aches:  none Diet compliance: Exercise: walking Other complaints:  CKD followed by Dr. Ronald Lobo... improving some in recent checks.   MDD, mild  stable on sertraline 50 mg daily.  Some trouble sleeping at night... has not yet tried melatonin.   Reviewed CCM note drom 3/30.23 in detail Recent consult visits: 08/20/21 Dr Gershon Crane ([place]): Cataract eval. Progressed. Pt does not want surgery.   06/18/21 Dr Terri Skains (Cardiology): follow up CHF-daily weights recommended.Since the patient has gained weight since last office visit recommend transitioning her back to Eliquis 5  mg p.o. twice daily.EKG,ECG   05/28/21 Dr Moshe Cipro (Nephrology): f/u CKD stage 4. Renal function is stabilizing. Mild fluid restriction recommended.     Relevant past medical, surgical, family and social history reviewed and updated as indicated. Interim medical history since our last visit reviewed. Allergies and medications reviewed and updated. Outpatient Medications Prior to Visit  Medication Sig Dispense Refill   acetaminophen (TYLENOL) 500 MG tablet Take 1,000 mg by mouth 2 (two) times daily as  needed (pain).     apixaban (ELIQUIS) 5 MG TABS tablet Take 1 tablet (5 mg total) by mouth 2 (two) times daily. 180 tablet 1   aspirin 81 MG chewable tablet Chew 1 tablet (81 mg total) by mouth daily. 30 tablet 0   carvedilol (COREG) 3.125 MG tablet TAKE 1 TABLET (3.125 MG TOTAL) BY MOUTH 2 (TWO) TIMES DAILY WITH A MEAL. 180 tablet 1   Cholecalciferol (VITAMIN D3 PO) Take 1,000 Units by mouth in the morning and at bedtime.     ezetimibe (ZETIA) 10 MG tablet TAKE 1 TABLET BY MOUTH EVERY DAY 90 tablet 1   famotidine (PEPCID) 20 MG tablet TAKE 1 TABLET BY MOUTH TWICE A DAY 180 tablet 1   furosemide (LASIX) 20 MG tablet TAKE 1 TABLET BY MOUTH TWICE A DAY (Patient taking differently: Take 20 mg by mouth daily.) 180 tablet 1   Magnesium 400 MG TABS Take 400 mg by mouth 2 (two) times daily.      sertraline (ZOLOFT) 50 MG tablet TAKE 1 TABLET BY MOUTH EVERYDAY AT BEDTIME 90 tablet 0   vitamin B-12 (CYANOCOBALAMIN) 1000 MCG tablet Take 1,000 mcg by mouth daily.     rosuvastatin (CRESTOR) 10 MG tablet TAKE 1 TABLET (10 MG TOTAL) BY MOUTH DAILY AT 6 PM. 90 tablet 1   No facility-administered medications prior to visit.     Per HPI unless specifically indicated in ROS section below Review of Systems  Constitutional:  Negative for fatigue and fever.  HENT:  Negative for congestion.   Eyes:  Negative for pain.  Respiratory:  Negative for cough and shortness of breath.   Cardiovascular:  Negative for chest pain, palpitations and leg swelling.  Gastrointestinal:  Negative for abdominal pain.  Genitourinary:  Negative for dysuria and vaginal bleeding.  Musculoskeletal:  Negative for back pain.  Neurological:  Negative for syncope, light-headedness and headaches.  Psychiatric/Behavioral:  Negative for dysphoric mood.   Objective:  BP 128/72 (BP Location: Right Arm, Patient Position: Sitting, Cuff Size: Normal)   Pulse 72   Temp 98.2 F (36.8 C) (Oral)   Ht '5\' 4"'$  (1.626 m)   Wt 147 lb 3.2 oz (66.8 kg)    SpO2 98%   BMI 25.27 kg/m   Wt Readings from Last 3 Encounters:  10/04/21 147 lb 3.2 oz (66.8 kg)  08/16/21 147 lb (66.7 kg)  06/18/21 144 lb 3.2 oz (65.4 kg)      Physical Exam Vitals and nursing note reviewed.  Constitutional:      General: She is not in acute distress.    Appearance: Normal appearance. She is well-developed. She is not ill-appearing or toxic-appearing.  HENT:     Head: Normocephalic.     Right Ear: Hearing, tympanic membrane, ear canal and external ear normal.     Left Ear: Hearing, tympanic membrane, ear canal and external ear normal.     Nose: Nose normal.  Eyes:     General: Lids are normal. Lids are everted, no foreign bodies appreciated.  Conjunctiva/sclera: Conjunctivae normal.     Pupils: Pupils are equal, round, and reactive to light.  Neck:     Thyroid: No thyroid mass or thyromegaly.     Vascular: No carotid bruit.     Trachea: Trachea normal.  Cardiovascular:     Rate and Rhythm: Normal rate and regular rhythm.     Heart sounds: Normal heart sounds, S1 normal and S2 normal. No murmur heard.   No gallop.  Pulmonary:     Effort: Pulmonary effort is normal. No respiratory distress.     Breath sounds: Normal breath sounds. No wheezing, rhonchi or rales.  Abdominal:     General: Bowel sounds are normal. There is no distension or abdominal bruit.     Palpations: Abdomen is soft. There is no fluid wave or mass.     Tenderness: There is no abdominal tenderness. There is no guarding or rebound.     Hernia: No hernia is present.  Musculoskeletal:     Cervical back: Normal range of motion and neck supple.  Lymphadenopathy:     Cervical: No cervical adenopathy.  Skin:    General: Skin is warm and dry.     Findings: No rash.  Neurological:     Mental Status: She is alert.     Cranial Nerves: No cranial nerve deficit.     Sensory: No sensory deficit.  Psychiatric:        Mood and Affect: Mood is not anxious or depressed.        Speech: Speech  normal.        Behavior: Behavior normal. Behavior is cooperative.        Judgment: Judgment normal.      Results for orders placed or performed in visit on 09/27/21  Hepatitis C antibody  Result Value Ref Range   Hepatitis C Ab NON-REACTIVE NON-REACTIVE   SIGNAL TO CUT-OFF 0.10 <1.00  Vitamin B12  Result Value Ref Range   Vitamin B-12 >1504 (H) 211 - 911 pg/mL  VITAMIN D 25 Hydroxy (Vit-D Deficiency, Fractures)  Result Value Ref Range   VITD 53.00 30.00 - 100.00 ng/mL  Comprehensive metabolic panel  Result Value Ref Range   Sodium 140 135 - 145 mEq/L   Potassium 4.2 3.5 - 5.1 mEq/L   Chloride 102 96 - 112 mEq/L   CO2 29 19 - 32 mEq/L   Glucose, Bld 87 70 - 99 mg/dL   BUN 21 6 - 23 mg/dL   Creatinine, Ser 2.18 (H) 0.40 - 1.20 mg/dL   Total Bilirubin 0.5 0.2 - 1.2 mg/dL   Alkaline Phosphatase 57 39 - 117 U/L   AST 21 0 - 37 U/L   ALT 8 0 - 35 U/L   Total Protein 7.1 6.0 - 8.3 g/dL   Albumin 4.5 3.5 - 5.2 g/dL   GFR 22.29 (L) >60.00 mL/min   Calcium 9.2 8.4 - 10.5 mg/dL  Lipid panel  Result Value Ref Range   Cholesterol 141 0 - 200 mg/dL   Triglycerides 100.0 0.0 - 149.0 mg/dL   HDL 49.60 >39.00 mg/dL   VLDL 20.0 0.0 - 40.0 mg/dL   LDL Cholesterol 71 0 - 99 mg/dL   Total CHOL/HDL Ratio 3    NonHDL 90.92     This visit occurred during the SARS-CoV-2 public health emergency.  Safety protocols were in place, including screening questions prior to the visit, additional usage of staff PPE, and extensive cleaning of exam room while observing appropriate contact time as  indicated for disinfecting solutions.   COVID 19 screen:  No recent travel or known exposure to COVID19 The patient denies respiratory symptoms of COVID 19 at this time. The importance of social distancing was discussed today.   Assessment and Plan The patient's preventative maintenance and recommended screening tests for an annual wellness exam were reviewed in full today. Brought up to date unless services  declined.  Counselled on the importance of diet, exercise, and its role in overall health and mortality. The patient's FH and SH was reviewed, including their home life, tobacco status, and drug and alcohol status.   Vaccines: refused  Shingrix and tdap at this time.. will given Prevnar 20 Pap/DVE:   not indicated.,  Mammo:  mother breast cancer, due Bone Density: not interested in bone density. Colon:  2017, she has been told that she is  high risk for bleed on blood thinners.. does not wish to proceed. Smoking Status: former smoker ETOH/ drug use: none/none  Hep C:  done  Problem List Items Addressed This Visit     CKD (chronic kidney disease) stage 4, GFR 15-29 ml/min (HCC) (Chronic)    Chronic, stable control. Followed by renal, Dr. Moshe Cipro       Essential hypertension (Chronic)    Stable, chronic.  Continue current medication.  coreg 3.125 mg BID       HFrEF (heart failure with reduced ejection fraction) (HCC) (Chronic)    Chronic, well controlled followed by cardiology.  Euvolemic in office today       HLD (hyperlipidemia) (Chronic)    Stable, chronic.  Continue current medication.   Almost at goal LDL < 70 on  zetia 10 mg daily and crestor 10 mg daily       Insomnia    Acute worsening.  I recommended a trial of melatonin 3 mg at bedtime.       MDD (major depressive disorder), single episode, mild (HCC)    Chronic, well controlled on sertraline 50 mg p.o. daily.       Nonischemic cardiomyopathy (HCC)   Paroxysmal atrial fibrillation (HCC)    followed by cardiology: on eliquis 5 mg BID  for anticoagulation and rate controlled with coreg 3.125 mg BID        Other Visit Diagnoses     Medicare annual wellness visit, subsequent    -  Primary   Relevant Orders   Pneumococcal conjugate vaccine 20-valent (Prevnar 20) (Completed)          [name], MD

## 2021-10-04 NOTE — Progress Notes (Signed)
Just had eye exam three weeks ago. Does have cataracts tho

## 2021-10-04 NOTE — Patient Instructions (Signed)
Please call the location of your choice from the menu below to schedule your Mammogram and/or Bone Density appointment.    [facility]                      Phone:  [phone] [address]                               [place], [place] [address]                                                             Services: Traditional and 3D Mammogram, Bone Density   [facility]                 Phone: [phone] [address]                                                       [place], [place] [address]    Service: Bone Density ONLY   *this site does NOT perform mammograms  [facility]                        Phone:  [phone] [address]                                  [place], [place] [address]                                            Services:  3D Mammogram and Bone Density    

## 2021-10-11 DIAGNOSIS — Z4502 Encounter for adjustment and management of automatic implantable cardiac defibrillator: Secondary | ICD-10-CM | POA: Diagnosis not present

## 2021-10-11 DIAGNOSIS — I4901 Ventricular fibrillation: Secondary | ICD-10-CM | POA: Diagnosis not present

## 2021-10-11 DIAGNOSIS — I5022 Chronic systolic (congestive) heart failure: Secondary | ICD-10-CM | POA: Diagnosis not present

## 2021-10-17 ENCOUNTER — Telehealth: Payer: Self-pay

## 2021-10-17 NOTE — Chronic Care Management (AMB) (Signed)
? ? ?Chronic Care Management ?Pharmacy Assistant  ? ?Name: [name]  MRN: [id number] DOB: [birth year] ? ? ?Reason for Encounter: Hypertension Disease State ?  ? ?Recent office visits:  ?10/04/21-Janice Bedsole,MD(PCP)-[facility]-discussed screenings,vaccines,diet,exercise,schedule mammogram,bone density.- discussed labs-no medication changes ? ?Recent consult visits:  ?None since last CCM contact ? ?Hospital visits:  ?None in previous 6 months ? ?Medications: ?Outpatient Encounter Medications as of 10/17/2021  ?Medication Sig  ? acetaminophen (TYLENOL) 500 MG tablet Take 1,000 mg by mouth 2 (two) times daily as needed (pain).  ? apixaban (ELIQUIS) 5 MG TABS tablet Take 1 tablet (5 mg total) by mouth 2 (two) times daily.  ? aspirin 81 MG chewable tablet Chew 1 tablet (81 mg total) by mouth daily.  ? carvedilol (COREG) 3.125 MG tablet TAKE 1 TABLET (3.125 MG TOTAL) BY MOUTH 2 (TWO) TIMES DAILY WITH A MEAL.  ? Cholecalciferol (VITAMIN D3 PO) Take 1,000 Units by mouth in the morning and at bedtime.  ? ezetimibe (ZETIA) 10 MG tablet TAKE 1 TABLET BY MOUTH EVERY DAY  ? famotidine (PEPCID) 20 MG tablet TAKE 1 TABLET BY MOUTH TWICE A DAY  ? furosemide (LASIX) 20 MG tablet TAKE 1 TABLET BY MOUTH TWICE A DAY (Patient taking differently: Take 20 mg by mouth daily.)  ? Magnesium 400 MG TABS Take 400 mg by mouth 2 (two) times daily.   ? rosuvastatin (CRESTOR) 10 MG tablet TAKE 1 TABLET (10 MG TOTAL) BY MOUTH DAILY AT 6 PM.  ? sertraline (ZOLOFT) 50 MG tablet TAKE 1 TABLET BY MOUTH EVERYDAY AT BEDTIME  ? vitamin B-12 (CYANOCOBALAMIN) 1000 MCG tablet Take 1,000 mcg by mouth daily.  ? ?No facility-administered encounter medications on file as of 10/17/2021.  ? ? ?Recent Office Vitals: ?BP Readings from Last 3 Encounters:  ?10/04/21 128/72  ?08/16/21 128/60  ?06/18/21 119/63  ? ?Pulse Readings from Last 3 Encounters:  ?10/04/21 72  ?08/16/21 78  ?06/18/21 75  ?  ?Wt Readings from Last 3 Encounters:  ?10/04/21 147 lb 3.2 oz (66.8 kg)  ?08/16/21  147 lb (66.7 kg)  ?06/18/21 144 lb 3.2 oz (65.4 kg)  ?  ? ?Kidney Function ?Lab Results  ?Component Value Date/Time  ? CREATININE 2.18 (H) 09/27/2021 08:56 AM  ? CREATININE 2.60 (H) 10/25/2020 03:21 PM  ? GFR 22.29 (L) 09/27/2021 08:56 AM  ? GFRNONAA 15 (L) 08/03/2020 01:26 AM  ? GFRAA 20 (L) 03/06/2020 11:42 AM  ? ? ? ?  Latest Ref Rng & Units 09/27/2021  ?  8:56 AM 10/25/2020  ?  3:21 PM 08/24/2020  ?  1:54 PM  ?BMP  ?Glucose 70 - 99 mg/dL 87   89   104    ?BUN 6 - 23 mg/dL '21   29   27    '$ ?Creatinine 0.40 - 1.20 mg/dL 2.18   2.60   3.06    ?Sodium 135 - 145 mEq/L 140   142   138    ?Potassium 3.5 - 5.1 mEq/L 4.2   4.3   4.4    ?Chloride 96 - 112 mEq/L 102   104   102    ?CO2 19 - 32 mEq/L '29   28   28    '$ ?Calcium 8.4 - 10.5 mg/dL 9.2   9.1   9.3    ? ? ? ?Contacted patient on 10/17/21 to discuss hypertension disease state Spoke with daughter [name] ? ?Current antihypertensive regimen:  ?Carvedilol 3.125 mg BID  ?Furosemide 20 mg AM  ?  Patient verbally confirms she is taking the above medications as directed. Yes ? ?How often are you checking your Blood Pressure? weekly ? ?she checks her blood pressure in the morning before taking her medication. ? ?Current home BP readings:  ? ?      BP               PULSE ?   119/63  - ?   121/64  - ?   116/60  - ? ?Wrist or arm cuff: arm cuff,  new one just ordered  ?Caffeine intake:only on dinner out  ?Salt intake: never salts cooked food ?Over the counter medications including pseudoephedrine or NSAIDs? Coricidin HBP  rare on occasion ? ?Any readings above 180/120? No ? ?What recent interventions/DTPs have been made by any provider to improve Blood Pressure control since last CPP Visit: patient is weighing daily  148lbs  ? ?Any recent hospitalizations or ED visits since last visit with CPP? No ? ?What diet changes have been made to improve Blood Pressure Control? Will not add salt to any food ? ?What exercise is being done to improve your Blood Pressure Control?  ? Patient does  own grocery shopping, enjoys shopping in retail stores and helps several days a week with  66 month old grandson. ? ?Adherence Review: ?Is the patient currently on ACE/ARB medication? No ?Does the patient have >5 day gap between last estimated fill dates? No ? ? ?Star Rating Drugs:  ?Medication:  Last Fill: Day Supply ?Rosuvastatin '10mg'$  10/17/21  90 ? ?Care Gaps: ?Annual wellness visit in last year? Yes ?Most Recent BP reading:128/72  72-P ? ?Upcoming appointments: ?CCM appointment on 12/18/21 ? ? ?Charlene Brooke, CPP notified ? ?Virl Coble, CCMA ?Health concierge  ?[phone]  ?

## 2021-11-08 ENCOUNTER — Telehealth: Payer: Self-pay

## 2021-11-08 NOTE — Progress Notes (Signed)
    Chronic Care Management Pharmacy Assistant   Name: [name]  MRN: [id number] DOB: [birth year]  Reason for Encounter: CCM (Reschedule Appointment)   Patient was contact to reschedule their CCM appointment with Charlene Brooke.  Patient has been reschedule for 01/06/2022 at 3:45.  Charlene Brooke, CPP notified  Marijean Niemann, [place] Clinical Pharmacy Assistant [phone]

## 2021-11-09 NOTE — Assessment & Plan Note (Signed)
Chronic, well controlled on sertraline 50 mg p.o. daily 

## 2021-11-09 NOTE — Assessment & Plan Note (Signed)
followed by cardiology: on eliquis 5 mg BID  for anticoagulation and rate controlled with coreg 3.125 mg BID

## 2021-11-09 NOTE — Assessment & Plan Note (Signed)
Stable, chronic.  Continue current medication.  coreg 3.125 mg BID

## 2021-11-09 NOTE — Assessment & Plan Note (Signed)
Chronic, stable control. Followed by renal, Dr. Moshe Cipro

## 2021-11-09 NOTE — Assessment & Plan Note (Signed)
Acute worsening.  I recommended a trial of melatonin 3 mg at bedtime.

## 2021-11-09 NOTE — Assessment & Plan Note (Signed)
Chronic, well controlled followed by cardiology.  Euvolemic in office today

## 2021-11-09 NOTE — Assessment & Plan Note (Signed)
Stable, chronic.  Continue current medication.   Almost at goal LDL < 70 on  zetia 10 mg daily and crestor 10 mg daily

## 2021-11-10 ENCOUNTER — Other Ambulatory Visit: Payer: Self-pay | Admitting: Family Medicine

## 2021-11-10 ENCOUNTER — Other Ambulatory Visit: Payer: Self-pay | Admitting: Cardiology

## 2021-11-11 DIAGNOSIS — Z4502 Encounter for adjustment and management of automatic implantable cardiac defibrillator: Secondary | ICD-10-CM | POA: Diagnosis not present

## 2021-11-11 DIAGNOSIS — I4901 Ventricular fibrillation: Secondary | ICD-10-CM | POA: Diagnosis not present

## 2021-11-11 DIAGNOSIS — I5022 Chronic systolic (congestive) heart failure: Secondary | ICD-10-CM | POA: Diagnosis not present

## 2021-11-11 DIAGNOSIS — Z95810 Presence of automatic (implantable) cardiac defibrillator: Secondary | ICD-10-CM | POA: Diagnosis not present

## 2021-11-28 ENCOUNTER — Other Ambulatory Visit: Payer: Self-pay | Admitting: Cardiology

## 2021-11-28 DIAGNOSIS — I480 Paroxysmal atrial fibrillation: Secondary | ICD-10-CM

## 2021-11-28 DIAGNOSIS — N184 Chronic kidney disease, stage 4 (severe): Secondary | ICD-10-CM | POA: Diagnosis not present

## 2021-11-28 DIAGNOSIS — I509 Heart failure, unspecified: Secondary | ICD-10-CM | POA: Diagnosis not present

## 2021-11-28 DIAGNOSIS — Z7901 Long term (current) use of anticoagulants: Secondary | ICD-10-CM

## 2021-11-28 DIAGNOSIS — I129 Hypertensive chronic kidney disease with stage 1 through stage 4 chronic kidney disease, or unspecified chronic kidney disease: Secondary | ICD-10-CM | POA: Diagnosis not present

## 2021-12-08 ENCOUNTER — Other Ambulatory Visit: Payer: Self-pay | Admitting: Gastroenterology

## 2021-12-12 DIAGNOSIS — Z95810 Presence of automatic (implantable) cardiac defibrillator: Secondary | ICD-10-CM | POA: Diagnosis not present

## 2021-12-12 DIAGNOSIS — I4901 Ventricular fibrillation: Secondary | ICD-10-CM | POA: Diagnosis not present

## 2021-12-12 DIAGNOSIS — Z4502 Encounter for adjustment and management of automatic implantable cardiac defibrillator: Secondary | ICD-10-CM | POA: Diagnosis not present

## 2021-12-12 DIAGNOSIS — I5022 Chronic systolic (congestive) heart failure: Secondary | ICD-10-CM | POA: Diagnosis not present

## 2021-12-18 ENCOUNTER — Telehealth: Payer: Medicare HMO

## 2021-12-20 ENCOUNTER — Ambulatory Visit: Payer: Medicare HMO

## 2021-12-20 DIAGNOSIS — I429 Cardiomyopathy, unspecified: Secondary | ICD-10-CM | POA: Diagnosis not present

## 2021-12-20 DIAGNOSIS — I5022 Chronic systolic (congestive) heart failure: Secondary | ICD-10-CM

## 2022-01-01 ENCOUNTER — Telehealth: Payer: Self-pay

## 2022-01-01 NOTE — Chronic Care Management (AMB) (Signed)
    Chronic Care Management Pharmacy Assistant   Name: [name]  MRN: [id number] DOB: 01-08-1951  Reason for Encounter: Reminder Call    Conditions to be addressed/monitored: HTN, HLD, and CKD Stage 4   Medications: Outpatient Encounter Medications as of 01/01/2022  Medication Sig   acetaminophen (TYLENOL) 500 MG tablet Take 1,000 mg by mouth 2 (two) times daily as needed (pain).   aspirin 81 MG chewable tablet Chew 1 tablet (81 mg total) by mouth daily.   carvedilol (COREG) 3.125 MG tablet TAKE 1 TABLET BY MOUTH TWICE A DAY WITH A MEAL   Cholecalciferol (VITAMIN D3 PO) Take 1,000 Units by mouth in the morning and at bedtime.   ELIQUIS 5 MG TABS tablet TAKE 1 TABLET BY MOUTH TWICE A DAY   ezetimibe (ZETIA) 10 MG tablet TAKE 1 TABLET BY MOUTH EVERY DAY   famotidine (PEPCID) 20 MG tablet TAKE 1 TABLET BY MOUTH TWICE A DAY   furosemide (LASIX) 20 MG tablet TAKE 1 TABLET BY MOUTH TWICE A DAY   Magnesium 400 MG TABS Take 400 mg by mouth 2 (two) times daily.    rosuvastatin (CRESTOR) 10 MG tablet TAKE 1 TABLET (10 MG TOTAL) BY MOUTH DAILY AT 6 PM.   sertraline (ZOLOFT) 50 MG tablet TAKE 1 TABLET BY MOUTH EVERYDAY AT BEDTIME   vitamin B-12 (CYANOCOBALAMIN) 1000 MCG tablet Take 1,000 mcg by mouth daily.   No facility-administered encounter medications on file as of 01/01/2022.    [name] was contacted to remind of upcoming telephone visit with Charlene Brooke  on 01/06/22 at 3:45pm. Patient was reminded to have any blood glucose and blood pressure readings available for review at appointment.   Patient confirmed appointment.  Daughter [name] confirmed, call patient on home # [phone]   Are you having any problems with your medications? No   Do you have any concerns you like to discuss with the pharmacist? No   CCM referral has been placed prior to visit?  No    Star Rating Drugs: Medication:  Last Fill: Day Supply Rosuvastatin '10mg'$  10/17/21  [place], CPP notified  Avel Sensor, [place]  [phone]

## 2022-01-03 DIAGNOSIS — Z4502 Encounter for adjustment and management of automatic implantable cardiac defibrillator: Secondary | ICD-10-CM | POA: Diagnosis not present

## 2022-01-03 DIAGNOSIS — Z95810 Presence of automatic (implantable) cardiac defibrillator: Secondary | ICD-10-CM | POA: Diagnosis not present

## 2022-01-03 DIAGNOSIS — I4901 Ventricular fibrillation: Secondary | ICD-10-CM | POA: Diagnosis not present

## 2022-01-03 DIAGNOSIS — I5022 Chronic systolic (congestive) heart failure: Secondary | ICD-10-CM | POA: Diagnosis not present

## 2022-01-06 ENCOUNTER — Ambulatory Visit: Payer: Medicare HMO | Admitting: Pharmacist

## 2022-01-06 DIAGNOSIS — N184 Chronic kidney disease, stage 4 (severe): Secondary | ICD-10-CM

## 2022-01-06 DIAGNOSIS — I480 Paroxysmal atrial fibrillation: Secondary | ICD-10-CM

## 2022-01-06 DIAGNOSIS — E782 Mixed hyperlipidemia: Secondary | ICD-10-CM

## 2022-01-06 DIAGNOSIS — F320 Major depressive disorder, single episode, mild: Secondary | ICD-10-CM

## 2022-01-06 DIAGNOSIS — I10 Essential (primary) hypertension: Secondary | ICD-10-CM

## 2022-01-06 DIAGNOSIS — I5020 Unspecified systolic (congestive) heart failure: Secondary | ICD-10-CM

## 2022-01-06 NOTE — Progress Notes (Signed)
Chronic Care Management Pharmacy Note  01/13/2022 Name:  [name] MRN:  [id number] DOB:  04-03-1951  Summary: CCM Initial visit -Reviewed medications; pt affirms compliance and denies issues  Recommendations/Changes made from today's visit: -No med changes   Plan: -Transition CCM to Self Care: Patient achieved CCM goals and no longer needs to be contacted as frequently. Patient advised services will still be available to them if they would like to reach out or have any new health concerns. Verified patient had contact information to pharmacist and health concierge on hand. Patient made aware CCM services would be continued if desired. Patient consented to cancel future CCM appointments.    Subjective: [name] is an 71 y.o. year old female who is a primary patient of Bedsole, Amy E, MD.  The CCM team was consulted for assistance with disease management and care coordination needs.    Engaged with patient by telephone for follow up visit in response to provider referral for pharmacy case management and/or care coordination services.   Consent to Services:  The patient was given information about Chronic Care Management services, agreed to services, and gave verbal consent prior to initiation of services.  Please see initial visit note for detailed documentation.   Patient Care Team: Excell Seltzer, MD as PCP - General (Family Medicine) Eustaquio Boyden, MD (Family Medicine) Tessa Lerner, DO as Consulting Physician (Cardiology) Kathyrn Sheriff, [facility] as Pharmacist (Pharmacist)  Recent office visits: 10/04/21 Dr Ermalene Searing OV: AWV - gave Prevnar 20. Trial melatonin for sleep. Call to schedule mammogram/bone density  08/16/21-Family Medicine-James Cable,NP-acute maxillary sinusitis- start amoxicillin 500mg  take 1 capsule 2 times daily for 7 days   02/15/21 Dr 04/17/21 OV: pyrogenic granuloma. Advised Dr Ermalene Searing for laser/surgical removal  Recent consult visits: 08/20/21 Dr  10/20/21 [facility]): Cataract eval. Progressed. Pt does not want surgery.  06/18/21 Dr 08/16/21 (Cardiology): follow up CHF-daily weights recommended.Since the patient has gained weight since last office visit recommend transitioning her back to Eliquis 5 mg p.o. twice daily.EKG,ECG  05/28/21 Dr 05/30/21 (Nephrology): f/u CKD stage 4. Renal function is stabilizing. Mild fluid restriction recommended.  Hospital visits: None in previous 6 months   Objective:  Lab Results  Component Value Date   CREATININE 2.18 (H) 09/27/2021   BUN 21 09/27/2021   GFR 22.29 (L) 09/27/2021   GFRNONAA 15 (L) 08/03/2020   GFRAA 20 (L) 03/06/2020   NA 140 09/27/2021   K 4.2 09/27/2021   CALCIUM 9.2 09/27/2021   CO2 29 09/27/2021   GLUCOSE 87 09/27/2021    Lab Results  Component Value Date/Time   HGBA1C 5.7 (H) 08/03/2019 02:49 AM   GFR 22.29 (L) 09/27/2021 08:56 AM   GFR 18.16 (L) 10/25/2020 03:21 PM    Last diabetic Eye exam: No results found for: "HMDIABEYEEXA"  Last diabetic Foot exam: No results found for: "HMDIABFOOTEX"   Lab Results  Component Value Date   CHOL 141 09/27/2021   HDL 49.60 09/27/2021   LDLCALC 71 09/27/2021   LDLDIRECT 193.0 09/02/2016   TRIG 100.0 09/27/2021   CHOLHDL 3 09/27/2021       Latest Ref Rng & Units 09/27/2021    8:56 AM 07/30/2020    5:04 PM 07/27/2020    1:01 PM  Hepatic Function  Total Protein 6.0 - 8.3 g/dL 7.1  7.2  6.9   Albumin 3.5 - 5.2 g/dL 4.5  4.2  4.1   AST 0 - 37 U/L 21  25  14   ALT 0 - 35 U/L $Remo'8  11  5   'QkWie$ Alk Phosphatase 39 - 117 U/L 57  64  48   Total Bilirubin 0.2 - 1.2 mg/dL 0.5  1.1  0.4     Lab Results  Component Value Date/Time   TSH 1.487 12/07/2019 12:34 PM   TSH 0.550 08/24/2019 09:42 AM   TSH 0.75 08/23/2019 01:15 PM   TSH 0.66 09/16/2017 10:33 AM   FREET4 0.89 03/26/2015 04:39 PM   FREET4 0.94 10/02/2014 10:49 AM       Latest Ref Rng & Units 07/30/2020    5:04 PM 06/12/2020    2:56 AM 04/23/2020   12:52 PM  CBC   WBC 4.0 - 10.5 K/uL 6.0  6.0  5.4   Hemoglobin 12.0 - 15.0 g/dL 11.2  10.6  10.7   Hematocrit 36.0 - 46.0 % 34.5  32.4  32.8   Platelets 150 - 400 K/uL 196  173  223.0     Lab Results  Component Value Date/Time   VD25OH 53.00 09/27/2021 08:56 AM   VD25OH 40.62 07/27/2020 01:01 PM    Clinical ASCVD: Yes  The 10-year ASCVD risk score (Arnett DK, et al., 2019) is: 20.1%   Values used to calculate the score:     Age: 30 years     Sex: Female     Is Non-Hispanic African American: No     Diabetic: No     Tobacco smoker: Yes     Systolic Blood Pressure: 269 mmHg     Is BP treated: Yes     HDL Cholesterol: 49.6 mg/dL     Total Cholesterol: 141 mg/dL       10/04/2021   12:00 PM 07/27/2020    1:06 PM 04/23/2020   12:11 PM  Depression screen PHQ 2/9  Decreased Interest 0 1 1  Down, Depressed, Hopeless 0 1 0  PHQ - 2 Score 0 2 1  Altered sleeping 0 0 0  Tired, decreased energy 0 2 3  Change in appetite 0 2 3  Feeling bad or failure about yourself  0 0 0  Trouble concentrating 0 0 0  Moving slowly or fidgety/restless 0 0 0  Suicidal thoughts 0 0 0  PHQ-9 Score 0 6 7  Difficult doing work/chores Not difficult at all  Somewhat difficult    CHA2DS2/VAS Stroke Risk Points  Current as of yesterday     7 >= 2 Points: High Risk  1 - 1.99 Points: Medium Risk  0 Points: Low Risk    Last Change: N/A      Points Metrics  1 Has Congestive Heart Failure:  Yes    Current as of yesterday  1 Has Vascular Disease:  Yes    Current as of yesterday  1 Has Hypertension:  Yes    Current as of yesterday  1 Age:  54    Current as of yesterday  0 Has Diabetes:  No    Current as of yesterday  2 Had Stroke:  Yes  Had TIA:  Yes  Had Thromboembolism:  No    Current as of yesterday  1 Female:  Yes    Current as of yesterday    Social History   Tobacco Use  Smoking Status Former   Packs/day: 1.00   Years: 50.00   Total pack years: 50.00   Types: Cigarettes   Quit date: 08/08/2019    Years since quitting: 2.4  Smokeless Tobacco Never  BP Readings from Last 3 Encounters:  10/04/21 128/72  08/16/21 128/60  06/18/21 119/63   Pulse Readings from Last 3 Encounters:  10/04/21 72  08/16/21 78  06/18/21 75   Wt Readings from Last 3 Encounters:  10/04/21 147 lb 3.2 oz (66.8 kg)  08/16/21 147 lb (66.7 kg)  06/18/21 144 lb 3.2 oz (65.4 kg)   BMI Readings from Last 3 Encounters:  10/04/21 25.27 kg/m  08/16/21 25.23 kg/m  06/18/21 24.75 kg/m    Assessment/Interventions: Review of patient past medical history, allergies, medications, health status, including review of consultants reports, laboratory and other test data, was performed as part of comprehensive evaluation and provision of chronic care management services.   SDOH:  (Social Determinants of Health) assessments and interventions performed: No    SDOH Screenings   Alcohol Screen: Not on file  Depression (PHQ2-9): Low Risk  (10/04/2021)   Depression (PHQ2-9)    PHQ-2 Score: 0  Financial Resource Strain: Low Risk  (09/13/2021)   Overall Financial Resource Strain (CARDIA)    Difficulty of Paying Living Expenses: Not very hard  Food Insecurity: No Food Insecurity (09/13/2021)   Hunger Vital Sign    Worried About Running Out of Food in the Last Year: Never true    Ran Out of Food in the Last Year: Never true  Housing: Not on file  Physical Activity: Not on file  Social Connections: Not on file  Stress: Not on file  Tobacco Use: Medium Risk (10/04/2021)   Patient History    Smoking Tobacco Use: Former    Smokeless Tobacco Use: Never    Passive Exposure: Not on file  Transportation Needs: Not on file    [place]  Allergies  Allergen Reactions   Shellfish-Derived Products Anaphylaxis   Ativan [Lorazepam] Other (See Comments)    Makes "skin crawl" and insomnia   Buspar [Buspirone] Nausea Only    Sweating and dizzy   Clarithromycin Swelling   Codeine Nausea And Vomiting   Doxycycline Other  (See Comments)    Unknown   Guaifenesin Other (See Comments)    Palpitations   Oxycodone Nausea And Vomiting   Prednisone Nausea And Vomiting and Other (See Comments)    Makes my heart race   Statins Other (See Comments)    Muscle pain   Sulfonamide Derivatives Nausea And Vomiting    Achiness   Tetracycline Nausea Only   Vicodin [Hydrocodone-Acetaminophen] Nausea And Vomiting   Famotidine Rash   Latex Rash   Omeprazole Nausea Only    Cough, shortness of breath - patient doesn't remember   Peanut-Containing Drug Products Itching and Rash   Protonix [Pantoprazole] Rash   Wellbutrin [Bupropion] Palpitations    Medications Reviewed Today     Reviewed by [place], [place], [place] (Certified Medical Assistant) on 10/04/21 at 1153  Med List Status: <None>   Medication Order Taking? Sig Documenting Provider Last Dose Status Informant  acetaminophen (TYLENOL) 500 MG tablet [id number] Yes Take 1,000 mg by mouth 2 (two) times daily as needed (pain). [provider] Taking Active Self  apixaban (ELIQUIS) 5 MG TABS tablet [id number] Yes Take 1 tablet (5 mg total) by mouth 2 (two) times daily. Rex Kras, DO Taking Active   aspirin 81 MG chewable tablet [id number] Yes Chew 1 tablet (81 mg total) by mouth daily. Alma Friendly, MD Taking Active Self  carvedilol (COREG) 3.125 MG tablet [id number] Yes TAKE 1 TABLET (3.125 MG TOTAL) BY MOUTH 2 (TWO) TIMES DAILY WITH A MEAL.  Rex Kras, DO Taking Active   Cholecalciferol (VITAMIN D3 PO) [id number] Yes Take 1,000 Units by mouth in the morning and at bedtime. [provider] Taking Active Self  ezetimibe (ZETIA) 10 MG tablet [id number] Yes TAKE 1 TABLET BY MOUTH EVERY DAY Tolia, Sunit, DO Taking Active   famotidine (PEPCID) 20 MG tablet [id number] Yes TAKE 1 TABLET BY MOUTH TWICE A DAY Nandigam, Kavitha V, MD Taking Active   furosemide (LASIX) 20 MG tablet [id number] Yes TAKE 1 TABLET BY MOUTH TWICE A DAY  Patient taking differently: Take  20 mg by mouth daily.   Rex Kras, DO Taking Active   Magnesium 400 MG TABS [id number] Yes Take 400 mg by mouth 2 (two) times daily.  [provider] Taking Active Self  rosuvastatin (CRESTOR) 10 MG tablet [id number]  TAKE 1 TABLET (10 MG TOTAL) BY MOUTH DAILY AT 6 PM. Tolia, Sunit, DO  Expired 08/21/21 2359   sertraline (ZOLOFT) 50 MG tablet [id number] Yes TAKE 1 TABLET BY MOUTH EVERYDAY AT BEDTIME Bedsole, Amy E, MD Taking Active   vitamin B-12 (CYANOCOBALAMIN) 1000 MCG tablet [id number] Yes Take 1,000 mcg by mouth daily. [provider] Taking Active             Patient Active Problem List   Diagnosis Date Noted   Pyogenic granuloma 02/15/2021   Vulvar/labial polyp 10/25/2020   Mixed incontinence urge and stress 10/25/2020   Chronic diarrhea 09/20/2020   Colitis, Clostridium difficile 08/16/2020   Acute respiratory failure with hypoxemia ([place]) 07/30/2020   Acute on chronic combined systolic and diastolic congestive heart failure ([place]) 06/12/2020   CKD (chronic kidney disease) stage 4, GFR 15-29 ml/min ([place]) 06/12/2020   DNR (do not resuscitate) 06/12/2020   Normocytic anemia 06/12/2020   Encounter for assessment of implantable cardioverter-defibrillator (ICD) 11/10/2019   H/O cardiac arrest 08/03/2019 11/10/2019   Coronary artery disease involving native coronary artery of native heart without angina pectoris 11/03/2019   HFrEF (heart failure with reduced ejection fraction) ([place]) 11/03/2019   ICD BiV Medtronic model [id number] [place] Quad CRT-D SureScan ICD 08/25/2019    Cardiac arrest with ventricular fibrillation ([place]) 08/24/2019   Long term current use of anticoagulant 08/14/2019   Nonischemic cardiomyopathy ([place]) 08/14/2019   Paroxysmal atrial fibrillation ([place]) 08/06/2019   Dysphagia    History of CVA (cerebrovascular accident) 08/02/2019   Left bundle branch block 08/02/2019   Primary localized osteoarthritis of right knee 02/17/2017   Vitamin B12  deficiency 09/10/2015   Insomnia 07/16/2015   GERD (gastroesophageal reflux disease) 03/26/2015   HLD (hyperlipidemia) 11/19/2011   MDD (major depressive disorder), single episode, mild ([place]) 01/08/2011   Former smoker 12/19/2008   Essential hypertension 12/19/2008    Immunization History  Administered Date(s) Administered   Influenza Split 07/01/2011   PFIZER(Purple Top)SARS-COV-2 Vaccination 02/16/2020, 03/08/2020, 10/05/2020   PNEUMOCOCCAL CONJUGATE-20 10/04/2021   Zoster, Live 12/22/2011    Conditions to be addressed/monitored:  Hypertension, Hyperlipidemia, Atrial Fibrillation, Heart Failure, Chronic Kidney Disease, and Depression/Insomnia  Care Plan : [place]  Updates made by Charlton Haws, [place] since 01/13/2022 12:00 AM     Problem: Hypertension, Hyperlipidemia, Atrial Fibrillation, Heart Failure, Chronic Kidney Disease, and Depression/Insomnia   Priority: High     Long-Range Goal: Disease mgmt   Start Date: 09/13/2021  Expected End Date: 01/13/2022  This Visit's Progress: On track  Recent Progress: On track  Priority: High  Note:   Current Barriers:  None identified  Pharmacist Clinical Goal(s):  Patient  will contact provider office for questions/concerns as evidenced notation of same in electronic health record through collaboration with PharmD and provider.   Interventions: 1:1 collaboration with Jinny Sanders, MD regarding development and update of comprehensive plan of care as evidenced by provider attestation and co-signature Inter-disciplinary care team collaboration (see longitudinal plan of care) Comprehensive medication review performed; medication list updated in electronic medical record  Hypertension / Heart Failure (BP goal <130/80) -Controlled- BP at goal; she denies any swelling on furosemide daily -Last ejection fraction: 25-30% (Date: 08/01/20) -HF type: Combined Systolic and Diastolic; NYHA Class: II  -s/p BiV ICD s/p Vfib  arrest 2021 -Current home BP/HR readings: none available -Current treatment: Carvedilol 3.125 mg BID -Appropriate, Effective, Safe, Accessible Furosemide 20 mg AM -Appropriate, Effective, Safe, Accessible -Medications previously tried: hydralazine/nitrate - fatigue  -Cannot be on ACE/ARB, ARNI, spironolactone due to CKD -Educated on BP goals and benefits of medications for prevention of heart attack, stroke and kidney damage; Importance of home blood pressure monitoring; Symptoms of hypotension and importance of maintaining adequate hydration; -Educated on Importance of weighing daily; if you gain more than 3 pounds in one day or 5 pounds in one week, contact provider -Counseled to monitor BP at home daily, document, and provide log at future appointments -Recommended to continue current medication  Hyperlipidemia / CAD (LDL goal < 70) -Controlled - LDL 71 (09/2021) at goal -Hx CAD, CVA -Current treatment: Rosuvastatin 10 mg daily - Appropriate, Effective, Safe, Accessible Ezetimibe 10 mg daily PM - Appropriate, Effective, Safe, Accessible Aspirin 81 mg daily AM -Appropriate, Effective, Safe, Accessible -Medications previously tried: n/a  -Educated on Cholesterol goals; Benefits of statin for ASCVD risk reduction; -Recommended to continue current medication  Atrial Fibrillation (Goal: prevent stroke and major bleeding) -Controlled - pt endorses compliance with Eliquis and denies s/sx of bleeding; she reports Eliquis is expensive but reasonable -CHADSVASC: 7 -Cr 2.6, age 74, weight 66 kg -Current treatment: Carvedilol 3.125 mg BID - Appropriate, Effective, Safe, Accessible Eliquis 5 mg BID -Appropriate, Effective, Safe, Accessible -Medications previously tried: n/a -Counseled on increased risk of stroke due to Afib and benefits of anticoagulation for stroke prevention; importance of adherence to anticoagulant exactly as prescribed; bleeding risk associated with Eliquis and importance of  self-monitoring for signs/symptoms of bleeding; avoidance of NSAIDs due to increased bleeding risk with anticoagulants; -Recommended to continue current medication  Adjustment disorder (Goal: manage symptoms) -Controlled/stable - pt reports struggling with mental health for over a decade, starting when her husband had a "psychotic break" and had to be institutionalized for 7 months - husband has been home for several years now and relatively stable, but pt still has flashbacks regarding this; she sometimes has trouble sleeping -PHQ9: 6 (07/2020) - mild depression -Connected with PCP for mental health support -Current treatment: Sertraline 50 mg daily 8pm - Appropriate, Effective, Safe, Accessible -Medications previously tried/failed: alprazolam (sleep), buspar, lorazepam, hydroxyzine -Educated on Benefits of medication for symptom control -Discussed sleep hygiene at length - advised avoiding screens before bed, keeping a strict bedtime routine and timing, try relaxing activities before bed -Recommend to continue current medication; consider melatonin for sleep  CKD Stage 4 (Goal: slow progression) -Controlled - kidney function has been stable -Follows with Dr Moshe Cipro -Medication adjustments: avoid nephrotoxins (NSAIDS), avoid ACE/ARB, ARNI, AA  Health Maintenance -Vaccine gaps: covid booster, Prevnar, Shingrix, TDAP  Patient Goals/Self-Care Activities Patient will:  - take medications as prescribed as evidenced by patient report and record review focus on medication adherence by  pill box check blood pressure daily, document, and provide at future appointments -Try melatonin 10 mg at bedtime       Medication Assistance: None required.  Patient affirms current coverage meets needs.  Compliance/Adherence/Medication fill history: Care Gaps: Hep C screening DEXA scan Colonoscopy (due 07/08/18) Mammogram (due 07/26/20)  Star-Rating Drugs: Rosuvastatin - PDC 100%  Medication  Access: Within the past 30 days, how often has patient missed a dose of medication? 0 Is a pillbox or other method used to improve adherence? Yes  Factors that may affect medication adherence? no barriers identified Are meds synced by current pharmacy? No  Are meds delivered by current pharmacy? No  Does patient experience delays in picking up medications due to transportation concerns? No   [facility] Services Reviewed: Is patient disadvantaged to use [facility]?: No  Current Rx insurance plan: [employer] Name and location of Current pharmacy:  [id number] [place], [address] [place] [address] Phone: [phone] Fax: [phone]  [facility] services reviewed with patient today?: No  Patient requests to transfer care to [facility]?: No  Reason patient declined to change pharmacies: Not mentioned at this visit   Care Plan and Follow Up Patient Decision:  Patient agrees to Care Plan and Follow-up.  Plan: The patient has been provided with contact information for the care management team and has been advised to call with any health related questions or concerns.   Charlene Brooke, PharmD, BCACP Clinical Pharmacist [place] [facility] at [facility] [phone]

## 2022-01-07 ENCOUNTER — Other Ambulatory Visit: Payer: Self-pay | Admitting: Cardiology

## 2022-01-12 DIAGNOSIS — Z95810 Presence of automatic (implantable) cardiac defibrillator: Secondary | ICD-10-CM | POA: Diagnosis not present

## 2022-01-12 DIAGNOSIS — I5022 Chronic systolic (congestive) heart failure: Secondary | ICD-10-CM | POA: Diagnosis not present

## 2022-01-12 DIAGNOSIS — I4901 Ventricular fibrillation: Secondary | ICD-10-CM | POA: Diagnosis not present

## 2022-01-12 DIAGNOSIS — Z4502 Encounter for adjustment and management of automatic implantable cardiac defibrillator: Secondary | ICD-10-CM | POA: Diagnosis not present

## 2022-01-13 NOTE — Patient Instructions (Addendum)
Visit Information  Phone number for Pharmacist: [phone]   Goals Addressed   None     Care Plan : [place]  Updates made by Charlton Haws, RPH since 01/13/2022 12:00 AM     Problem: Hypertension, Hyperlipidemia, Atrial Fibrillation, Heart Failure, Chronic Kidney Disease, and Depression/Insomnia   Priority: High     Long-Range Goal: Disease mgmt   Start Date: 09/13/2021  Expected End Date: 01/13/2022  This Visit's Progress: On track  Recent Progress: On track  Priority: High  Note:   Current Barriers:  None identified  Pharmacist Clinical Goal(s):  Patient will contact provider office for questions/concerns as evidenced notation of same in electronic health record through collaboration with PharmD and provider.   Interventions: 1:1 collaboration with Jinny Sanders, MD regarding development and update of comprehensive plan of care as evidenced by provider attestation and co-signature Inter-disciplinary care team collaboration (see longitudinal plan of care) Comprehensive medication review performed; medication list updated in electronic medical record  Hypertension / Heart Failure (BP goal <130/80) -Controlled- BP at goal; she denies any swelling on furosemide daily -Last ejection fraction: 25-30% (Date: 08/01/20) -HF type: Combined Systolic and Diastolic; NYHA Class: II  -s/p BiV ICD s/p Vfib arrest 2021 -Current home BP/HR readings: none available -Current treatment: Carvedilol 3.125 mg BID -Appropriate, Effective, Safe, Accessible Furosemide 20 mg AM -Appropriate, Effective, Safe, Accessible -Medications previously tried: hydralazine/nitrate - fatigue  -Cannot be on ACE/ARB, ARNI, spironolactone due to CKD -Educated on BP goals and benefits of medications for prevention of heart attack, stroke and kidney damage; Importance of home blood pressure monitoring; Symptoms of hypotension and importance of maintaining adequate hydration; -Educated on  Importance of weighing daily; if you gain more than 3 pounds in one day or 5 pounds in one week, contact provider -Counseled to monitor BP at home daily, document, and provide log at future appointments -Recommended to continue current medication  Hyperlipidemia / CAD (LDL goal < 70) -Controlled - LDL 71 (09/2021) at goal -Hx CAD, CVA -Current treatment: Rosuvastatin 10 mg daily - Appropriate, Effective, Safe, Accessible Ezetimibe 10 mg daily PM - Appropriate, Effective, Safe, Accessible Aspirin 81 mg daily AM -Appropriate, Effective, Safe, Accessible -Medications previously tried: n/a  -Educated on Cholesterol goals; Benefits of statin for ASCVD risk reduction; -Recommended to continue current medication  Atrial Fibrillation (Goal: prevent stroke and major bleeding) -Controlled - pt endorses compliance with Eliquis and denies s/sx of bleeding; she reports Eliquis is expensive but reasonable -CHADSVASC: 7 -Cr 2.6, age 51, weight 66 kg -Current treatment: Carvedilol 3.125 mg BID - Appropriate, Effective, Safe, Accessible Eliquis 5 mg BID -Appropriate, Effective, Safe, Accessible -Medications previously tried: n/a -Counseled on increased risk of stroke due to Afib and benefits of anticoagulation for stroke prevention; importance of adherence to anticoagulant exactly as prescribed; bleeding risk associated with Eliquis and importance of self-monitoring for signs/symptoms of bleeding; avoidance of NSAIDs due to increased bleeding risk with anticoagulants; -Recommended to continue current medication  Adjustment disorder (Goal: manage symptoms) -Controlled/stable - pt reports struggling with mental health for over a decade, starting when her husband had a "psychotic break" and had to be institutionalized for 7 months - husband has been home for several years now and relatively stable, but pt still has flashbacks regarding this; she sometimes has trouble sleeping -PHQ9: 6 (07/2020) - mild  depression -Connected with PCP for mental health support -Current treatment: Sertraline 50 mg daily 8pm - Appropriate, Effective, Safe, Accessible -Medications previously tried/failed: alprazolam (sleep),  buspar, lorazepam, hydroxyzine -Educated on Benefits of medication for symptom control -Discussed sleep hygiene at length - advised avoiding screens before bed, keeping a strict bedtime routine and timing, try relaxing activities before bed -Recommend to continue current medication; consider melatonin for sleep  CKD Stage 4 (Goal: slow progression) -Controlled - kidney function has been stable -Follows with Dr Moshe Cipro -Medication adjustments: avoid nephrotoxins (NSAIDS), avoid ACE/ARB, ARNI, AA  Health Maintenance -Vaccine gaps: covid booster, Prevnar, Shingrix, TDAP  Patient Goals/Self-Care Activities Patient will:  - take medications as prescribed as evidenced by patient report and record review focus on medication adherence by pill box check blood pressure daily, document, and provide at future appointments -Try melatonin 10 mg at bedtime      Patient verbalizes understanding of instructions and care plan provided today and agrees to view in [place]. Active MyChart status and patient understanding of how to access instructions and care plan via MyChart confirmed with patient.    The patient has been provided with contact information for the care management team and has been advised to call with any health related questions or concerns.   Charlene Brooke, PharmD, BCACP Clinical Pharmacist [place] [facility] at [facility] [phone]

## 2022-01-16 ENCOUNTER — Encounter: Payer: Self-pay | Admitting: Cardiology

## 2022-01-16 ENCOUNTER — Ambulatory Visit: Payer: Medicare HMO | Admitting: Cardiology

## 2022-01-16 VITALS — BP 118/60 | HR 84 | Temp 97.6°F | Resp 16 | Ht 64.0 in | Wt 154.0 lb

## 2022-01-16 DIAGNOSIS — I429 Cardiomyopathy, unspecified: Secondary | ICD-10-CM

## 2022-01-16 DIAGNOSIS — Z8674 Personal history of sudden cardiac arrest: Secondary | ICD-10-CM | POA: Diagnosis not present

## 2022-01-16 DIAGNOSIS — Z8673 Personal history of transient ischemic attack (TIA), and cerebral infarction without residual deficits: Secondary | ICD-10-CM | POA: Diagnosis not present

## 2022-01-16 DIAGNOSIS — Z87891 Personal history of nicotine dependence: Secondary | ICD-10-CM | POA: Diagnosis not present

## 2022-01-16 DIAGNOSIS — Z8679 Personal history of other diseases of the circulatory system: Secondary | ICD-10-CM

## 2022-01-16 DIAGNOSIS — Z95810 Presence of automatic (implantable) cardiac defibrillator: Secondary | ICD-10-CM | POA: Diagnosis not present

## 2022-01-16 DIAGNOSIS — N184 Chronic kidney disease, stage 4 (severe): Secondary | ICD-10-CM

## 2022-01-16 DIAGNOSIS — I5022 Chronic systolic (congestive) heart failure: Secondary | ICD-10-CM

## 2022-01-16 DIAGNOSIS — E782 Mixed hyperlipidemia: Secondary | ICD-10-CM | POA: Diagnosis not present

## 2022-01-16 DIAGNOSIS — I480 Paroxysmal atrial fibrillation: Secondary | ICD-10-CM | POA: Diagnosis not present

## 2022-01-16 DIAGNOSIS — Z7901 Long term (current) use of anticoagulants: Secondary | ICD-10-CM

## 2022-01-16 DIAGNOSIS — I447 Left bundle-branch block, unspecified: Secondary | ICD-10-CM | POA: Diagnosis not present

## 2022-01-16 DIAGNOSIS — I10 Essential (primary) hypertension: Secondary | ICD-10-CM

## 2022-01-16 NOTE — Progress Notes (Signed)
[name] Date of Birth: 09-Apr-1951 MRN: [id number] Primary Care Provider:Bedsole, Mervyn Gay, MD Primary Cardiologist: Rex Kras, DO, [facility] Electrophysiologist: Dr. Reggy Eye   Date: 01/16/22 Last Office Visit: 06/21/2021  Chief Complaint  Patient presents with   Congestive Heart Failure   Follow-up    7 month   HPI  [name] is a 71 y.o. female whose past medical history and cardiac risk factors include: Hypertension, hyperlipidemia, former smoker, history of Arnold-Chiari malformation, history of VT/VF, history of torsades, cardiac arrest, nonischemic cardiomyopathy, peripheral vascular disease, paroxysmal atrial fibrillation, left bundle branch block, history of CVA.  Patient is accompanied by her daughter [name] at today's office visit.   She has a complex cardiac history dating back to 2021 when she presented to the ED with V. tach/V-fib cardiac arrest and was noted to have multiple episodes of torsades.  She was diagnosed with nonischemic cardiomyopathy.  She eventually went home with a LifeVest and was successfully defibrillated due to V-fib and subsequently underwent BiV ICD implantation.  Since then patient's medications have been uptitrated to hopefully improve her cardiomyopathy; however, given her renal function majority of the medications were discontinued due to worsening renal function or hyperkalemia.  Patient now presents for 60-monthfollow-up visit.  Since last office visit she denies angina pectoris or heart failure symptoms.  She is regaining her appetite and has gained 10 pounds over the last 6 months.  No hospitalizations for congestive heart failure.  Last device interrogation from July 2023 reviewed.  ALLERGIES: Allergies  Allergen Reactions   Shellfish-Derived Products Anaphylaxis   Ativan [Lorazepam] Other (See Comments)    Makes "skin crawl" and insomnia   Buspar [Buspirone] Nausea Only    Sweating and dizzy   Clarithromycin Swelling    Codeine Nausea And Vomiting   Doxycycline Other (See Comments)    Unknown   Guaifenesin Other (See Comments)    Palpitations   Oxycodone Nausea And Vomiting   Prednisone Nausea And Vomiting and Other (See Comments)    Makes my heart race   Statins Other (See Comments)    Muscle pain   Sulfonamide Derivatives Nausea And Vomiting    Achiness   Tetracycline Nausea Only   Vicodin [Hydrocodone-Acetaminophen] Nausea And Vomiting   Famotidine Rash   Latex Rash   Omeprazole Nausea Only    Cough, shortness of breath - patient doesn't remember   Peanut-Containing Drug Products Itching and Rash   Protonix [Pantoprazole] Rash   Wellbutrin [Bupropion] Palpitations   MEDICATION LIST PRIOR TO VISIT: Current Outpatient Medications on File Prior to Visit  Medication Sig Dispense Refill   acetaminophen (TYLENOL) 500 MG tablet Take 1,000 mg by mouth 2 (two) times daily as needed (pain).     aspirin 81 MG chewable tablet Chew 1 tablet (81 mg total) by mouth daily. 30 tablet 0   carvedilol (COREG) 3.125 MG tablet TAKE 1 TABLET BY MOUTH TWICE A DAY WITH A MEAL 180 tablet 1   Cholecalciferol (VITAMIN D3 PO) Take 1,000 Units by mouth in the morning and at bedtime.     ELIQUIS 5 MG TABS tablet TAKE 1 TABLET BY MOUTH TWICE A DAY 180 tablet 1   ezetimibe (ZETIA) 10 MG tablet TAKE 1 TABLET BY MOUTH EVERY DAY 90 tablet 1   famotidine (PEPCID) 20 MG tablet TAKE 1 TABLET BY MOUTH TWICE A DAY (Patient taking differently: Take 20 mg by mouth daily.) 180 tablet 1   furosemide (LASIX) 20 MG tablet TAKE 1 TABLET  BY MOUTH TWICE A DAY 180 tablet 1   Magnesium 400 MG TABS Take 400 mg by mouth 2 (two) times daily.      Melatonin 10 MG CAPS Take 1 tablet by mouth at bedtime and may repeat dose one time if needed.     rosuvastatin (CRESTOR) 10 MG tablet TAKE 1 TABLET (10 MG TOTAL) BY MOUTH DAILY AT 6 PM. 90 tablet 1   sertraline (ZOLOFT) 50 MG tablet TAKE 1 TABLET BY MOUTH EVERYDAY AT BEDTIME 90 tablet 1   vitamin B-12  (CYANOCOBALAMIN) 1000 MCG tablet Take 1,000 mcg by mouth daily.     No current facility-administered medications on file prior to visit.    PAST MEDICAL HISTORY: Past Medical History:  Diagnosis Date   Allergy    Anxiety    Arnold-Chiari malformation (HCC)    Arthritis    CHF (congestive heart failure) ([place])    Chronic systolic heart failure ([place]) 11/03/2019   Coronary artery disease    Dyspnea    Encounter for assessment of implantable cardioverter-defibrillator (ICD) 11/10/2019   GERD (gastroesophageal reflux disease)    H. pylori infection    3 years ago   H/O cardiac arrest    Hx of VT/Vfib arrest   Hyperlipidemia    ICD BiV Medtronic model [id number] Claria Quad CRT-D SureScan ICD 08/25/2019    Incontinence    Paroxysmal atrial fibrillation ([place]) 08/06/2019   Pulmonary edema 2021   Syncope and collapse    Per pt, denies passing out   Tobacco use disorder    Unspecified essential hypertension     PAST SURGICAL HISTORY: Past Surgical History:  Procedure Laterality Date   APPENDECTOMY     ARNOLD CHIARI SURGERY     neurocranial surgery   BIV ICD INSERTION CRT-D N/A 08/25/2019   Procedure: BIV ICD INSERTION CRT-D;  Surgeon: Constance Haw, MD;  Location: [place] [facility];  Service: Cardiovascular;  Laterality: N/A;   BLEPHAROPLASTY     Bil   C-EYE SURGERY PROCEDURE     CARDIAC CATHETERIZATION     CHOLECYSTECTOMY     EYE SURGERY     heart failure     LEFT HEART CATH AND CORONARY ANGIOGRAPHY N/A 08/04/2019   Procedure: LEFT HEART CATH AND CORONARY ANGIOGRAPHY;  Surgeon: Adrian Prows, MD;  Location: [facility];  Service: Cardiovascular;  Laterality: N/A;   MASS EXCISION Right 12/27/2013   Procedure: MINOR EXCISION OF RIGHT THUMB MUCOID CYST, DEBRIDEMENT OF INTERPHALANGEAL JOINT;  Surgeon: Cammie Sickle, MD;  Location: [facility];  Service: Orthopedics;  Laterality: Right;   mass on thumb  right   TOTAL KNEE ARTHROPLASTY Right 02/17/2017    TOTAL KNEE ARTHROPLASTY Right 02/17/2017   Procedure: TOTAL KNEE ARTHROPLASTY;  Surgeon: Melrose Nakayama, MD;  Location: [place] [facility];  Service: Orthopedics;  Laterality: Right;   TUBAL LIGATION      FAMILY HISTORY: The patient family history includes Bone cancer in her mother; Cancer in her sister; Diverticulosis in her sister; Heart disease in her mother; Hypertension in her sister; Tuberculosis in her father.   SOCIAL HISTORY:  The patient  reports that she quit smoking about 2 years ago. Her smoking use included cigarettes. She has a 50.00 pack-year smoking history. She has never used smokeless tobacco. She reports that she does not drink alcohol and does not use drugs.  Review of Systems  Constitutional: Positive for weight gain. Negative for chills, fever and weight loss.  HENT:  Negative for hoarse voice and nosebleeds.   Eyes:  Negative for discharge, double vision and pain.  Cardiovascular:  Negative for chest pain, claudication, dyspnea on exertion, leg swelling, near-syncope, orthopnea, palpitations, paroxysmal nocturnal dyspnea and syncope.  Respiratory:  Negative for hemoptysis and shortness of breath.   Musculoskeletal:  Negative for muscle cramps and myalgias.  Gastrointestinal:  Negative for abdominal pain, constipation, diarrhea, hematemesis, hematochezia, melena, nausea and vomiting.  Neurological:  Negative for dizziness and light-headedness.    PHYSICAL EXAM:    01/16/2022    1:26 PM 10/04/2021   11:53 AM 08/16/2021   11:55 AM  Vitals with BMI  Height '5\' 4"'$  '5\' 4"'$  '5\' 4"'$   Weight 154 lbs 147 lbs 3 oz 147 lbs  BMI 26.42 33.29 51.88  Systolic 416 606 301  Diastolic 60 72 60  Pulse 84 72 78   CONSTITUTIONAL: Appears older than stated age, hemodynamically stable, no acute distress.    SKIN: Skin is warm and dry. No rash noted. No cyanosis. No pallor. No jaundice HEAD: Normocephalic and atraumatic.  EYES: No scleral icterus MOUTH/THROAT: Moist oral membranes.  NECK: No JVD  present. No thyromegaly noted.   CHEST Normal respiratory effort. No intercostal retractions.  BiV ICD noted left infraclavicular region. LUNGS: Clear to auscultation bilaterally.  No stridor. No wheezes. No rales.  CARDIOVASCULAR: Regular, positive S0-F0, soft holosystolic murmur heard at the apex radiating to axilla, no gallops or rubs appreciated ABDOMINAL: Nonobese, soft, nontender, nondistended, positive bowel sounds in all 4 quadrants no apparent ascites.  EXTREMITIES: No bilateral peripheral edema. 2+ right femoral pulse, 1+ left femoral pulse, none palpable bilateral popliteal, dorsalis pedis or posterior tibial pulses.  Warm to touch bilaterally.  No discoloration or cyanosis present. HEMATOLOGIC: No significant bruising NEUROLOGIC: Oriented to person, place, and time. Nonfocal. Normal muscle tone.  PSYCHIATRIC: Normal mood and affect. Normal behavior. Cooperative  CARDIAC DATABASE: EKG: 08/12/2019: Normal sinus rhythm with ventricular rate of 61 bpm, left axis deviation, left bundle branch block, nonspecific T wave abnormalities.  Prior EKG dated 08/04/2019 shows a normal sinus rhythm, left axis deviation, left bundle branch block. 01/16/2022: Atrial paced ventricular sensed rhythm, 75 bpm, diffuse T wave inversions, similar to prior ECG.   Echocardiogram: 12/20/2021: Left ventricle cavity is normal in size. Normal left ventricular wall thickness. Doppler evidence of grade I (impaired) diastolic dysfunction, normal LAP. Left ventricle regional wall motion findings: Global hypokinesis. Cannot exclude inferior akinesis.  Moderately depressed LV systolic function with visual EF 35-40%. Calculated EF 41%. Left atrial cavity is mildly dilated. Right ventricle cavity is normal in size. Right ventricle pacemaker lead wires visualized. Normal right ventricular function. Mild (Grade I) mitral regurgitation. Mild tricuspid regurgitation. No evidence of pulmonary hypertension. Compared to the study  done on 02/28/2020, LVEF has again marginally improved from 30-35% to a percent 35 to 40%, also in 2021, patient's LVEF was 25 to 30%. Inferior wall motion abnormality is new.  Heart Catheterization: 08/04/19:  LV: Global hypokinesis, upper limit of normal size, EF 25 to 30%. Left main: Normal. LAD: Mild diffuse disease.  Proximal LAD has a 30 to 40% stenosis, mid segment has a 20 to 30% stenosis, scattered disease noted in the LAD.  Brisk flow. Circumflex: Again scattered disease noted in the circumflex.  Mid segment has at most a 30 to 40% stenosis which appears to be eccentric and calcified. RCA: Dominant.  Mild diffuse disease again noted.  Mid segment after the origin of RV branch has a 70 to  80% stenosis.  Brisk flow is evident throughout the RCA.  Impression: Findings consistent with nonischemic cardiomyopathy.  Although she has significant disease in the right coronary artery, this does not explain her presentation with global hypokinesis, neither does it explain VF arrest as the lesion does not appear to be unstable.   Biventricular ICD implantation 08/25/2019:  ICD BiV Medtronic model [id number] Claria Quad CRT-D SureScan ICD: Remote CRT-D transmission 01/12/2022: AP 98%, CRT LV 98%.  Longevity 6 years and 4 months.  Lead impedance and thresholds are normal.  There are rare ventricular monitoring episodes, EGM ='s PVCs.  Thoracic impedance is at baseline and does not suggest volume overload state.   Device advisory, potential reduced or no outflow energy output during high-voltage therapy, needs for programming change, nonemergent.  Carotid duplex: 08/03/2019: Right Carotid: Velocities in the right ICA are consistent with a 1-39% stenosis. Left Carotid: Velocities in the left ICA are consistent with a 1-39%  stenosis. Vertebrals:  Bilateral vertebral arteries demonstrate antegrade flow. Subclavians: Normal flow hemodynamics were seen in bilateral subclavian  arteries.  LABORATORY DATA:     Latest Ref Rng & Units 07/30/2020    5:04 PM 06/12/2020    2:56 AM 04/23/2020   12:52 PM  CBC  WBC 4.0 - 10.5 K/uL 6.0  6.0  5.4   Hemoglobin 12.0 - 15.0 g/dL 11.2  10.6  10.7   Hematocrit 36.0 - 46.0 % 34.5  32.4  32.8   Platelets 150 - 400 K/uL 196  173  223.0        Latest Ref Rng & Units 09/27/2021    8:56 AM 10/25/2020    3:21 PM 08/24/2020    1:54 PM  CMP  Glucose 70 - 99 mg/dL 87  89  104   BUN 6 - 23 mg/dL '21  29  27   '$ Creatinine 0.40 - 1.20 mg/dL 2.18  2.60  3.06   Sodium 135 - 145 mEq/L 140  142  138   Potassium 3.5 - 5.1 mEq/L 4.2  4.3  4.4   Chloride 96 - 112 mEq/L 102  104  102   CO2 19 - 32 mEq/L '29  28  28   '$ Calcium 8.4 - 10.5 mg/dL 9.2  9.1  9.3   Total Protein 6.0 - 8.3 g/dL 7.1     Total Bilirubin 0.2 - 1.2 mg/dL 0.5     Alkaline Phos 39 - 117 U/L 57     AST 0 - 37 U/L 21     ALT 0 - 35 U/L 8       Lipid Panel  Lab Results  Component Value Date   CHOL 141 09/27/2021   HDL 49.60 09/27/2021   LDLCALC 71 09/27/2021   LDLDIRECT 193.0 09/02/2016   TRIG 100.0 09/27/2021   CHOLHDL 3 09/27/2021    Lab Results  Component Value Date   HGBA1C 5.7 (H) 08/03/2019   No components found for: "NTPROBNP" Lab Results  Component Value Date   TSH 1.487 12/07/2019   TSH 0.550 08/24/2019   TSH 0.75 08/23/2019   External Labs: Collected: 05/28/2021 at [facility] BUN 27, creatinine 2.29 mg/dL Sodium 140, potassium 4.2, chloride 104, bicarb 30 Total cholesterol 136 HDL 44, LDL 62,  triglycerides 179 Hemoglobin 11.6 g/dL.  FINAL MEDICATION LIST END OF ENCOUNTER:   Current Outpatient Medications:    acetaminophen (TYLENOL) 500 MG tablet, Take 1,000 mg by mouth 2 (two) times daily as needed (pain)., Disp: , Rfl:  aspirin 81 MG chewable tablet, Chew 1 tablet (81 mg total) by mouth daily., Disp: 30 tablet, Rfl: 0   carvedilol (COREG) 3.125 MG tablet, TAKE 1 TABLET BY MOUTH TWICE A DAY WITH A MEAL, Disp: 180 tablet, Rfl: 1   Cholecalciferol (VITAMIN D3 PO), Take  1,000 Units by mouth in the morning and at bedtime., Disp: , Rfl:    ELIQUIS 5 MG TABS tablet, TAKE 1 TABLET BY MOUTH TWICE A DAY, Disp: 180 tablet, Rfl: 1   ezetimibe (ZETIA) 10 MG tablet, TAKE 1 TABLET BY MOUTH EVERY DAY, Disp: 90 tablet, Rfl: 1   famotidine (PEPCID) 20 MG tablet, TAKE 1 TABLET BY MOUTH TWICE A DAY (Patient taking differently: Take 20 mg by mouth daily.), Disp: 180 tablet, Rfl: 1   furosemide (LASIX) 20 MG tablet, TAKE 1 TABLET BY MOUTH TWICE A DAY, Disp: 180 tablet, Rfl: 1   Magnesium 400 MG TABS, Take 400 mg by mouth 2 (two) times daily. , Disp: , Rfl:    Melatonin 10 MG CAPS, Take 1 tablet by mouth at bedtime and may repeat dose one time if needed., Disp: , Rfl:    rosuvastatin (CRESTOR) 10 MG tablet, TAKE 1 TABLET (10 MG TOTAL) BY MOUTH DAILY AT 6 PM., Disp: 90 tablet, Rfl: 1   sertraline (ZOLOFT) 50 MG tablet, TAKE 1 TABLET BY MOUTH EVERYDAY AT BEDTIME, Disp: 90 tablet, Rfl: 1   vitamin B-12 (CYANOCOBALAMIN) 1000 MCG tablet, Take 1,000 mcg by mouth daily., Disp: , Rfl:   IMPRESSION:    ICD-10-CM   1. Chronic HFrEF (heart failure with reduced ejection fraction) (HCC)  I50.22 EKG 12-Lead    2. Cardiomyopathy, unspecified type ([place])  I42.9     3. Biventricular ICD (implantable cardioverter-defibrillator) in place  Z95.810     4. Left bundle branch block  I44.7     5. History of cardiac arrest  Z86.74     6. History of torsades de pointes  Z86.79     7. Paroxysmal atrial fibrillation (HCC)  I48.0     8. Long term current use of anticoagulant  Z79.01     9. Essential hypertension  I10     10. Chronic kidney disease, stage IV (severe) (HCC)  N18.4     11. Mixed hyperlipidemia  E78.2     12. History of CVA (cerebrovascular accident)  Z74.73     13. Former smoker  Z87.891        RECOMMENDATIONS: FREYJA GOVEA is a 71 y.o. female whose past medical history and cardiac risk factors include: Hypertension, hyperlipidemia, former smoker, history of Arnold-Chiari  malformation, history of VT/VF, history of torsades, cardiac arrest, nonischemic cardiomyopathy, peripheral vascular disease, paroxysmal atrial fibrillation, left bundle branch block, history of CVA.  Chronic HFrEF (heart failure with reduced ejection fraction) (HCC) Stage C, NYHA class II Euvolemic. Last hospitalizations: December 2021, February 2022 for heart failure. Medications reconciled. In the past unable to tolerate ARB/ARNI/Aldactone secondary to progressive CKD and hyperkalemia. She was on Bidil did not tolerate this secondary to feeling tired/fatigued. Diuretics currently being managed by nephrology given her chronic kidney disease.  Most recent echocardiogram from July 2023 independently reviewed and noted above for further reference. EKG shows atrial paced rhythm with diffuse T wave inversions similar to prior ECGs.  Denies angina pectoris. Continue current medical therapy.  Cardiomyopathy, suspect nonischemic ([place]) See above  Biventricular ICD (implantable cardioverter-defibrillator) in place Recent pacemaker interrogation reviewed. We will schedule the patient for an office device check  Paroxysmal atrial fibrillation (HCC) Rate control: Carvedilol. Rhythm control: N/A. Thromboembolic prophylaxis: Eliquis Dose of Eliquis is appropriate.  Given her age, weight, renal function Reemphasized the risks, benefits, and alternatives to oral anticoagulation. CHA2DS2-VASc SCORE is 7 which correlates to 9.6% risk of stroke per year (CHF, HTN, age, history of stroke, PVD, gender).   Essential hypertension Office blood pressures are well controlled. Continue pharmacological therapy  Chronic kidney disease, stage IV (severe) ([place]) Follows with nephrology.  Orders Placed This Encounter  Procedures   EKG 12-Lead   --Continue cardiac medications as reconciled in final medication list. --Return in about 6 months (around 07/19/2022) for Follow up, heart failure management... Or  sooner if needed. --Continue follow-up with your primary care physician regarding the management of your other chronic comorbid conditions.  Patient's questions and concerns were addressed to her and her daughter's satisfaction. She and her daughter voices understanding of the instructions provided during this encounter.   This note was created using a voice recognition software as a result there may be grammatical errors inadvertently enclosed that do not reflect the nature of this encounter. Every attempt is made to correct such errors.   Rex Kras, [place], [facility]  Pager: [phone] Office: [phone]

## 2022-02-12 DIAGNOSIS — Z95810 Presence of automatic (implantable) cardiac defibrillator: Secondary | ICD-10-CM | POA: Diagnosis not present

## 2022-02-12 DIAGNOSIS — Z4502 Encounter for adjustment and management of automatic implantable cardiac defibrillator: Secondary | ICD-10-CM | POA: Diagnosis not present

## 2022-02-12 DIAGNOSIS — I5022 Chronic systolic (congestive) heart failure: Secondary | ICD-10-CM | POA: Diagnosis not present

## 2022-02-12 DIAGNOSIS — I4901 Ventricular fibrillation: Secondary | ICD-10-CM | POA: Diagnosis not present

## 2022-03-03 ENCOUNTER — Encounter: Payer: Self-pay | Admitting: Family Medicine

## 2022-03-04 ENCOUNTER — Other Ambulatory Visit: Payer: Self-pay

## 2022-03-04 ENCOUNTER — Telehealth: Payer: Self-pay | Admitting: Gastroenterology

## 2022-03-04 DIAGNOSIS — A498 Other bacterial infections of unspecified site: Secondary | ICD-10-CM

## 2022-03-04 DIAGNOSIS — R197 Diarrhea, unspecified: Secondary | ICD-10-CM

## 2022-03-04 NOTE — Telephone Encounter (Signed)
Patient will come to the office for instructions on collecting a specimen for testing. We will use the [facility] test.  Lab Test: [facility]  Gastrointestinal Panel [id number] Client # [phone] [address] [place] [address] [phone]

## 2022-03-04 NOTE — Telephone Encounter (Signed)
Spoke with the patient's daughter. The patient asked her to call us about the diarrhea she is experiencing. Patient last seen in our office 08/10/2020. History of C diff. Multiple drug allergies. She was treated with Vancomycin.  Per the daughter, the patient is having 7 to 10 diarrhea stools and nocturnal stooling for the past 2 months. Diarrhea is watery. Some complaints of nausea. Stable weight. Antibiotics about a month ago for dental procedure. States she was instructed to contact GI by her PCP.  Please advise.

## 2022-03-04 NOTE — Telephone Encounter (Signed)
Please send Rx for Dificid '200mg'$  BID X 14 days. Check stool C.diff pcr and GI path panel. Thanks

## 2022-03-04 NOTE — Telephone Encounter (Signed)
Patient's daughter called, wondering if she can pick up stool sample for patient. Requesting a call back as soon as possible. Please call to advise.

## 2022-03-06 ENCOUNTER — Ambulatory Visit: Payer: Medicare HMO

## 2022-03-06 DIAGNOSIS — A0472 Enterocolitis due to Clostridium difficile, not specified as recurrent: Secondary | ICD-10-CM | POA: Diagnosis not present

## 2022-03-06 DIAGNOSIS — R197 Diarrhea, unspecified: Secondary | ICD-10-CM | POA: Diagnosis not present

## 2022-03-07 ENCOUNTER — Other Ambulatory Visit: Payer: Medicare HMO

## 2022-03-07 DIAGNOSIS — R197 Diarrhea, unspecified: Secondary | ICD-10-CM

## 2022-03-07 DIAGNOSIS — A498 Other bacterial infections of unspecified site: Secondary | ICD-10-CM

## 2022-03-10 ENCOUNTER — Telehealth: Payer: Self-pay

## 2022-03-10 NOTE — Telephone Encounter (Signed)
Results from [facility] received via fax.   GI pathogen panel study resulted negative for infectious process. Spoke with the patient's daughter. Patient continues to have "off and on" diarrhea. Offered an appointment. She will talk with the patient about this. She will let us know if she does want to be seen to discuss this with Dr Silverio Decamp.

## 2022-03-11 NOTE — Telephone Encounter (Signed)
Yes, agree with office visit to discuss options to manage chronic intermittent diarrhea.

## 2022-03-15 DIAGNOSIS — I5022 Chronic systolic (congestive) heart failure: Secondary | ICD-10-CM | POA: Diagnosis not present

## 2022-03-15 DIAGNOSIS — Z95810 Presence of automatic (implantable) cardiac defibrillator: Secondary | ICD-10-CM | POA: Diagnosis not present

## 2022-03-15 DIAGNOSIS — I4901 Ventricular fibrillation: Secondary | ICD-10-CM | POA: Diagnosis not present

## 2022-03-15 DIAGNOSIS — Z4502 Encounter for adjustment and management of automatic implantable cardiac defibrillator: Secondary | ICD-10-CM | POA: Diagnosis not present

## 2022-04-04 DIAGNOSIS — Z4502 Encounter for adjustment and management of automatic implantable cardiac defibrillator: Secondary | ICD-10-CM | POA: Diagnosis not present

## 2022-04-04 DIAGNOSIS — I4901 Ventricular fibrillation: Secondary | ICD-10-CM | POA: Diagnosis not present

## 2022-04-04 DIAGNOSIS — Z95810 Presence of automatic (implantable) cardiac defibrillator: Secondary | ICD-10-CM | POA: Diagnosis not present

## 2022-04-04 DIAGNOSIS — I5022 Chronic systolic (congestive) heart failure: Secondary | ICD-10-CM | POA: Diagnosis not present

## 2022-04-10 ENCOUNTER — Other Ambulatory Visit: Payer: Self-pay | Admitting: Cardiology

## 2022-04-15 ENCOUNTER — Encounter: Payer: Self-pay | Admitting: Family Medicine

## 2022-04-15 DIAGNOSIS — Z4502 Encounter for adjustment and management of automatic implantable cardiac defibrillator: Secondary | ICD-10-CM | POA: Diagnosis not present

## 2022-04-15 DIAGNOSIS — I4901 Ventricular fibrillation: Secondary | ICD-10-CM | POA: Diagnosis not present

## 2022-04-15 DIAGNOSIS — I5022 Chronic systolic (congestive) heart failure: Secondary | ICD-10-CM | POA: Diagnosis not present

## 2022-04-15 DIAGNOSIS — Z95810 Presence of automatic (implantable) cardiac defibrillator: Secondary | ICD-10-CM | POA: Diagnosis not present

## 2022-05-01 ENCOUNTER — Encounter: Payer: Self-pay | Admitting: Family Medicine

## 2022-05-01 ENCOUNTER — Ambulatory Visit (INDEPENDENT_AMBULATORY_CARE_PROVIDER_SITE_OTHER): Payer: Medicare HMO | Admitting: Family Medicine

## 2022-05-01 VITALS — BP 120/64 | HR 88 | Temp 97.6°F | Ht 64.0 in | Wt 159.2 lb

## 2022-05-01 DIAGNOSIS — R197 Diarrhea, unspecified: Secondary | ICD-10-CM | POA: Diagnosis not present

## 2022-05-01 DIAGNOSIS — I480 Paroxysmal atrial fibrillation: Secondary | ICD-10-CM | POA: Diagnosis not present

## 2022-05-01 DIAGNOSIS — K529 Noninfective gastroenteritis and colitis, unspecified: Secondary | ICD-10-CM

## 2022-05-01 DIAGNOSIS — N184 Chronic kidney disease, stage 4 (severe): Secondary | ICD-10-CM

## 2022-05-01 DIAGNOSIS — I5043 Acute on chronic combined systolic (congestive) and diastolic (congestive) heart failure: Secondary | ICD-10-CM

## 2022-05-01 DIAGNOSIS — I5020 Unspecified systolic (congestive) heart failure: Secondary | ICD-10-CM | POA: Diagnosis not present

## 2022-05-01 DIAGNOSIS — I10 Essential (primary) hypertension: Secondary | ICD-10-CM

## 2022-05-01 DIAGNOSIS — E782 Mixed hyperlipidemia: Secondary | ICD-10-CM

## 2022-05-01 LAB — LIPID PANEL
Cholesterol: 129 mg/dL (ref 0–200)
HDL: 44.5 mg/dL (ref >39.00)
LDL Cholesterol: 59 mg/dL (ref 0–99)
NonHDL: 84.87
Total CHOL/HDL Ratio: 3
Triglycerides: 131 mg/dL (ref 0.0–149.0)
VLDL: 26.2 mg/dL (ref 0.0–40.0)

## 2022-05-01 LAB — COMPREHENSIVE METABOLIC PANEL
ALT: 13 U/L (ref 0–35)
AST: 25 U/L (ref 0–37)
Albumin: 4.5 g/dL (ref 3.5–5.2)
Alkaline Phosphatase: 62 U/L (ref 39–117)
BUN: 18 mg/dL (ref 6–23)
CO2: 32 mEq/L (ref 19–32)
Calcium: 9.2 mg/dL (ref 8.4–10.5)
Chloride: 101 mEq/L (ref 96–112)
Creatinine, Ser: 2.17 mg/dL — ABNORMAL HIGH (ref 0.40–1.20)
GFR: 22.32 mL/min — ABNORMAL LOW (ref >60.00)
Glucose, Bld: 88 mg/dL (ref 70–99)
Potassium: 3.9 mEq/L (ref 3.5–5.1)
Sodium: 139 mEq/L (ref 135–145)
Total Bilirubin: 0.5 mg/dL (ref 0.2–1.2)
Total Protein: 7.4 g/dL (ref 6.0–8.3)

## 2022-05-01 NOTE — Assessment & Plan Note (Signed)
Chronic, significant improvement at last check on Crestor 10 mg daily and Zetia 10 mg daily. She does have some joint pain and muscle aches that she associates with her statin medication.  She will start a trial of CoQ10 for side effects.

## 2022-05-01 NOTE — Assessment & Plan Note (Signed)
Followed by cardiology.  Euvolemic in office today using Lasix 20 mg daily.

## 2022-05-01 NOTE — Assessment & Plan Note (Signed)
Stable, chronic.  Continue current medication.  Well-controlled on Coreg 3.125 mg p.o. twice daily, Lasix 20 mg daily

## 2022-05-01 NOTE — Assessment & Plan Note (Signed)
History of C. difficile. Per patient there are no acute symptoms she just always seems to have looser stool.  No abdominal pain, no fever, no red flags. Given she is taking high-dose magnesium I have suggested she hold this to see if her diarrhea resolves.  She can take a multivitamin instead.  If her diarrhea is not improving she will follow-up.

## 2022-05-01 NOTE — Progress Notes (Signed)
Patient ID: [name], female    DOB: Nov 02, 1950, 71 y.o.   MRN: [id number]  This visit was conducted in person.  BP 120/64 (BP Location: Left Arm, Patient Position: Sitting, Cuff Size: Normal)   Pulse 88   Temp 97.6 F (36.4 C) (Oral)   Ht '5\' 4"'$  (1.626 m)   Wt 159 lb 3.2 oz (72.2 kg)   SpO2 95%   BMI 27.33 kg/m    CC:  Chief Complaint  Patient presents with   Follow-up    6 month f/u    Subjective:   HPI: KEONDRIA Jackson is a 71 y.o. female presenting on 05/01/2022 for Follow-up (6 month f/u)  Hypertension:  Well-controlled on Coreg 3.125 mg p.o. twice daily, Lasix 20 mg daily BP Readings from Last 3 Encounters:  05/01/22 120/64  01/16/22 118/60  10/04/21 128/72  Using medication without problems or lightheadedness:  none Chest pain with exertion: none Edema: none Short of breath: none Average home BPs: Other issues:  HFrEF, atrial fibrillation followed by cardiology: on eliquis 5 mg BID  for anticoagulation and rate controlled with coreg 3.125 mg BID      Has diarrhea.. she  is  taking magnesium 400 mg BID.   Relevant past medical, surgical, family and social history reviewed and updated as indicated. Interim medical history since our last visit reviewed. Allergies and medications reviewed and updated. Outpatient Medications Prior to Visit  Medication Sig Dispense Refill   acetaminophen (TYLENOL) 500 MG tablet Take 1,000 mg by mouth 2 (two) times daily as needed (pain).     aspirin 81 MG chewable tablet Chew 1 tablet (81 mg total) by mouth daily. 30 tablet 0   carvedilol (COREG) 3.125 MG tablet TAKE 1 TABLET BY MOUTH TWICE A DAY WITH A MEAL 180 tablet 1   Cholecalciferol (VITAMIN D3 PO) Take 1,000 Units by mouth in the morning and at bedtime.     ELIQUIS 5 MG TABS tablet TAKE 1 TABLET BY MOUTH TWICE A DAY 180 tablet 1   ezetimibe (ZETIA) 10 MG tablet TAKE 1 TABLET BY MOUTH EVERY DAY 90 tablet 1   famotidine (PEPCID) 20 MG tablet TAKE 1 TABLET BY MOUTH  TWICE A DAY (Patient taking differently: Take 20 mg by mouth daily.) 180 tablet 1   furosemide (LASIX) 20 MG tablet TAKE 1 TABLET BY MOUTH TWICE A DAY 180 tablet 1   Magnesium 400 MG TABS Take 400 mg by mouth 2 (two) times daily.      Melatonin 10 MG CAPS Take 1 tablet by mouth at bedtime and may repeat dose one time if needed.     rosuvastatin (CRESTOR) 10 MG tablet TAKE 1 TABLET (10 MG TOTAL) BY MOUTH DAILY AT 6 PM. 90 tablet 1   sertraline (ZOLOFT) 50 MG tablet TAKE 1 TABLET BY MOUTH EVERYDAY AT BEDTIME 90 tablet 1   vitamin B-12 (CYANOCOBALAMIN) 1000 MCG tablet Take 1,000 mcg by mouth daily.     No facility-administered medications prior to visit.     Per HPI unless specifically indicated in ROS section below Review of Systems  Constitutional:  Negative for fatigue and fever.  HENT:  Negative for congestion.   Eyes:  Negative for pain.  Respiratory:  Negative for cough and shortness of breath.   Cardiovascular:  Negative for chest pain, palpitations and leg swelling.  Gastrointestinal:  Negative for abdominal pain.  Genitourinary:  Negative for dysuria and vaginal bleeding.  Musculoskeletal:  Negative for back pain.  Neurological:  Negative for syncope, light-headedness and headaches.  Psychiatric/Behavioral:  Negative for dysphoric mood.    Objective:  BP 120/64 (BP Location: Left Arm, Patient Position: Sitting, Cuff Size: Normal)   Pulse 88   Temp 97.6 F (36.4 C) (Oral)   Ht '5\' 4"'$  (1.626 m)   Wt 159 lb 3.2 oz (72.2 kg)   SpO2 95%   BMI 27.33 kg/m   Wt Readings from Last 3 Encounters:  05/01/22 159 lb 3.2 oz (72.2 kg)  01/16/22 154 lb (69.9 kg)  10/04/21 147 lb 3.2 oz (66.8 kg)      Physical Exam Constitutional:      General: She is not in acute distress.    Appearance: Normal appearance. She is well-developed. She is not ill-appearing or toxic-appearing.  HENT:     Head: Normocephalic.     Right Ear: Hearing, tympanic membrane, ear canal and external ear normal.  Tympanic membrane is not erythematous, retracted or bulging.     Left Ear: Hearing, tympanic membrane, ear canal and external ear normal. Tympanic membrane is not erythematous, retracted or bulging.     Nose: No mucosal edema or rhinorrhea.     Right Sinus: No maxillary sinus tenderness or frontal sinus tenderness.     Left Sinus: No maxillary sinus tenderness or frontal sinus tenderness.     Mouth/Throat:     Pharynx: Uvula midline.  Eyes:     General: Lids are normal. Lids are everted, no foreign bodies appreciated.     Conjunctiva/sclera: Conjunctivae normal.     Pupils: Pupils are equal, round, and reactive to light.  Neck:     Thyroid: No thyroid mass or thyromegaly.     Vascular: No carotid bruit.     Trachea: Trachea normal.  Cardiovascular:     Rate and Rhythm: Normal rate and regular rhythm.     Pulses: Normal pulses.     Heart sounds: Normal heart sounds, S1 normal and S2 normal. No murmur heard.    No friction rub. No gallop.  Pulmonary:     Effort: Pulmonary effort is normal. No tachypnea or respiratory distress.     Breath sounds: Normal breath sounds. No decreased breath sounds, wheezing, rhonchi or rales.  Abdominal:     General: Bowel sounds are normal.     Palpations: Abdomen is soft.     Tenderness: There is no abdominal tenderness.  Musculoskeletal:     Cervical back: Normal range of motion and neck supple.  Skin:    General: Skin is warm and dry.     Findings: No rash.  Neurological:     Mental Status: She is alert.  Psychiatric:        Mood and Affect: Mood is not anxious or depressed.        Speech: Speech normal.        Behavior: Behavior normal. Behavior is cooperative.        Thought Content: Thought content normal.        Judgment: Judgment normal.       Results for orders placed or performed in visit on 09/27/21  Hepatitis C antibody  Result Value Ref Range   Hepatitis C Ab NON-REACTIVE NON-REACTIVE   SIGNAL TO CUT-OFF 0.10 <1.00  Vitamin B12   Result Value Ref Range   Vitamin B-12 >1504 (H) 211 - 911 pg/mL  VITAMIN D 25 Hydroxy (Vit-D Deficiency, Fractures)  Result Value Ref Range   VITD 53.00 30.00 - 100.00 ng/mL  Comprehensive metabolic  panel  Result Value Ref Range   Sodium 140 135 - 145 mEq/L   Potassium 4.2 3.5 - 5.1 mEq/L   Chloride 102 96 - 112 mEq/L   CO2 29 19 - 32 mEq/L   Glucose, Bld 87 70 - 99 mg/dL   BUN 21 6 - 23 mg/dL   Creatinine, Ser 2.18 (H) 0.40 - 1.20 mg/dL   Total Bilirubin 0.5 0.2 - 1.2 mg/dL   Alkaline Phosphatase 57 39 - 117 U/L   AST 21 0 - 37 U/L   ALT 8 0 - 35 U/L   Total Protein 7.1 6.0 - 8.3 g/dL   Albumin 4.5 3.5 - 5.2 g/dL   GFR 22.29 (L) >60.00 mL/min   Calcium 9.2 8.4 - 10.5 mg/dL  Lipid panel  Result Value Ref Range   Cholesterol 141 0 - 200 mg/dL   Triglycerides 100.0 0.0 - 149.0 mg/dL   HDL 49.60 >39.00 mg/dL   VLDL 20.0 0.0 - 40.0 mg/dL   LDL Cholesterol 71 0 - 99 mg/dL   Total CHOL/HDL Ratio 3    NonHDL 90.92      COVID 19 screen:  No recent travel or known exposure to COVID19 The patient denies respiratory symptoms of COVID 19 at this time. The importance of social distancing was discussed today.   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  Euvolemic in office today using Lasix 20 mg daily.      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  Continue current medication.  Well-controlled on Coreg 3.125 mg p.o. twice daily, Lasix 20 mg daily      HFrEF (heart failure with reduced ejection fraction) (HCC) (Chronic)   HLD (hyperlipidemia) (Chronic)    Chronic, significant improvement at last check on Crestor 10 mg daily and Zetia 10 mg daily. She does have some joint pain and muscle aches that she associates with her statin medication.   She will start a trial of CoQ10 for side effects.      Relevant Orders   Lipid panel   Comprehensive metabolic panel   Chronic diarrhea    History of C. difficile. Has had work-up by GI in the past, Dr. Silverio Decamp.  Per patient there are no acute symptoms she just always seems to have looser stool.  No abdominal pain, no fever, no red flags. Given she is taking high-dose magnesium I have suggested she hold this to see if her diarrhea resolves.  She can take a multivitamin instead.  If her diarrhea is not improving she will follow-up.      RESOLVED: Diarrhea    History of C. difficile. Per patient there are no acute symptoms she just always seems to have looser stool.  No abdominal pain, no fever, no red flags. Given she is taking high-dose magnesium I have suggested she hold this to see if her diarrhea resolves.  She can take a multivitamin instead.  If her diarrhea is not improving she will follow-up.      Paroxysmal atrial fibrillation (HCC)    Rate controlled on Coreg, anticoagulation with Eliquis 5 mg p.o. twice daily.  Followed by cardiology.      Orders Placed This Encounter  Procedures   Lipid panel   Comprehensive metabolic panel  Makaleigh Reinard, MD   

## 2022-05-01 NOTE — Assessment & Plan Note (Signed)
Rate controlled on Coreg, anticoagulation with Eliquis 5 mg p.o. twice daily.  Followed by cardiology.

## 2022-05-01 NOTE — Patient Instructions (Addendum)
Hold magnesium as likely causing diarrhea.   Eat either magnesium in diet or instead take a seniors multivitamin daily.  Can try  CoQ10 for possible side effects from cholesterol medication.  Please stop at the lab to have labs drawn.

## 2022-05-01 NOTE — Assessment & Plan Note (Signed)
History of C. difficile. Has had work-up by GI in the past, Dr. Silverio Decamp.  Per patient there are no acute symptoms she just always seems to have looser stool.  No abdominal pain, no fever, no red flags. Given she is taking high-dose magnesium I have suggested she hold this to see if her diarrhea resolves.  She can take a multivitamin instead.  If her diarrhea is not improving she will follow-up.

## 2022-05-16 DIAGNOSIS — Z4502 Encounter for adjustment and management of automatic implantable cardiac defibrillator: Secondary | ICD-10-CM | POA: Diagnosis not present

## 2022-05-16 DIAGNOSIS — Z95810 Presence of automatic (implantable) cardiac defibrillator: Secondary | ICD-10-CM | POA: Diagnosis not present

## 2022-05-16 DIAGNOSIS — I4901 Ventricular fibrillation: Secondary | ICD-10-CM | POA: Diagnosis not present

## 2022-05-16 DIAGNOSIS — I5022 Chronic systolic (congestive) heart failure: Secondary | ICD-10-CM | POA: Diagnosis not present

## 2022-05-29 ENCOUNTER — Other Ambulatory Visit: Payer: Self-pay | Admitting: Family Medicine

## 2022-05-29 ENCOUNTER — Other Ambulatory Visit: Payer: Self-pay | Admitting: Cardiology

## 2022-06-16 DIAGNOSIS — Z950 Presence of cardiac pacemaker: Secondary | ICD-10-CM | POA: Diagnosis not present

## 2022-06-16 DIAGNOSIS — I5022 Chronic systolic (congestive) heart failure: Secondary | ICD-10-CM | POA: Diagnosis not present

## 2022-06-16 DIAGNOSIS — I4901 Ventricular fibrillation: Secondary | ICD-10-CM | POA: Diagnosis not present

## 2022-07-10 ENCOUNTER — Other Ambulatory Visit: Payer: Self-pay | Admitting: Cardiology

## 2022-07-17 DIAGNOSIS — I509 Heart failure, unspecified: Secondary | ICD-10-CM | POA: Diagnosis not present

## 2022-07-17 DIAGNOSIS — I4901 Ventricular fibrillation: Secondary | ICD-10-CM | POA: Diagnosis not present

## 2022-07-17 DIAGNOSIS — I5022 Chronic systolic (congestive) heart failure: Secondary | ICD-10-CM | POA: Diagnosis not present

## 2022-07-17 DIAGNOSIS — N184 Chronic kidney disease, stage 4 (severe): Secondary | ICD-10-CM | POA: Diagnosis not present

## 2022-07-17 DIAGNOSIS — Z4502 Encounter for adjustment and management of automatic implantable cardiac defibrillator: Secondary | ICD-10-CM | POA: Diagnosis not present

## 2022-07-17 DIAGNOSIS — I129 Hypertensive chronic kidney disease with stage 1 through stage 4 chronic kidney disease, or unspecified chronic kidney disease: Secondary | ICD-10-CM | POA: Diagnosis not present

## 2022-07-17 DIAGNOSIS — Z95810 Presence of automatic (implantable) cardiac defibrillator: Secondary | ICD-10-CM | POA: Diagnosis not present

## 2022-07-20 LAB — LAB REPORT - SCANNED
Creatinine, POC: 55.3 mg/dL
EGFR: 24
Protein/Creatinine Ratio: 221

## 2022-07-21 ENCOUNTER — Encounter: Payer: Self-pay | Admitting: Cardiology

## 2022-07-21 ENCOUNTER — Ambulatory Visit: Payer: Medicare HMO | Admitting: Cardiology

## 2022-07-21 VITALS — BP 149/79 | HR 91 | Resp 16 | Ht 64.0 in | Wt 164.2 lb

## 2022-07-21 DIAGNOSIS — E782 Mixed hyperlipidemia: Secondary | ICD-10-CM

## 2022-07-21 DIAGNOSIS — I6523 Occlusion and stenosis of bilateral carotid arteries: Secondary | ICD-10-CM

## 2022-07-21 DIAGNOSIS — Z8674 Personal history of sudden cardiac arrest: Secondary | ICD-10-CM | POA: Diagnosis not present

## 2022-07-21 DIAGNOSIS — I130 Hypertensive heart and chronic kidney disease with heart failure and stage 1 through stage 4 chronic kidney disease, or unspecified chronic kidney disease: Secondary | ICD-10-CM | POA: Diagnosis not present

## 2022-07-21 DIAGNOSIS — I5022 Chronic systolic (congestive) heart failure: Secondary | ICD-10-CM

## 2022-07-21 DIAGNOSIS — Z8673 Personal history of transient ischemic attack (TIA), and cerebral infarction without residual deficits: Secondary | ICD-10-CM | POA: Diagnosis not present

## 2022-07-21 DIAGNOSIS — Z7901 Long term (current) use of anticoagulants: Secondary | ICD-10-CM | POA: Diagnosis not present

## 2022-07-21 DIAGNOSIS — Z87891 Personal history of nicotine dependence: Secondary | ICD-10-CM

## 2022-07-21 DIAGNOSIS — Z8679 Personal history of other diseases of the circulatory system: Secondary | ICD-10-CM

## 2022-07-21 DIAGNOSIS — I428 Other cardiomyopathies: Secondary | ICD-10-CM | POA: Diagnosis not present

## 2022-07-21 DIAGNOSIS — I447 Left bundle-branch block, unspecified: Secondary | ICD-10-CM | POA: Diagnosis not present

## 2022-07-21 DIAGNOSIS — I480 Paroxysmal atrial fibrillation: Secondary | ICD-10-CM | POA: Diagnosis not present

## 2022-07-21 DIAGNOSIS — Z95810 Presence of automatic (implantable) cardiac defibrillator: Secondary | ICD-10-CM | POA: Diagnosis not present

## 2022-07-21 NOTE — Progress Notes (Signed)
[name] Date of Birth: [birth year] MRN: [id number] Primary Care Provider:Bedsole, Mervyn Gay, MD Primary Cardiologist: Rex Kras, DO, [facility] Electrophysiologist: Dr. Reggy Eye   Date: 07/21/22 Last Office Visit: 01/16/2022  Chief Complaint  Patient presents with   Congestive Heart Failure    32-monthfollow-up visit.   HPI  Janice CRISTis a 72y.o. female whose past medical history and cardiac risk factors include: Hypertension, hyperlipidemia, former smoker, history of Arnold-Chiari malformation, history of VT/VF, history of torsades, cardiac arrest, nonischemic cardiomyopathy, peripheral vascular disease, paroxysmal atrial fibrillation, left bundle branch block, history of CVA.  Patient is accompanied by her  at today's office visit.   In 2021 she presented to the ED with V. tach/V-fib cardiac arrest was noted to have multiple episodes of torsades.  She was diagnosed with nonischemic cardiomyopathy and eventually was advised to go home with LifeVest which successfully defibrillated her due to another V-fib event and she subsequently underwent BiV ICD implant.  Her medications have been uptitrated given her underlying HFrEF but overall up titration of medical therapy has been challenging due to underlying chronic kidney disease stage IV, episodes of hyperkalemia, and soft blood pressures.  She presents today for 650-monthollow-up visit.  She continues to become more active day by day.  She takes care of her grandkids twice a week.  She has gained weight secondary to caloric intake as opposed to fluid accumulation.  No recent hospitalizations or urgent care visits.  She recently saw nephrology and had labs with them.  Will request records.  ALLERGIES: Allergies  Allergen Reactions   Shellfish-Derived Products Anaphylaxis   Ativan [Lorazepam] Other (See Comments)    Makes "skin crawl" and insomnia   Buspar [Buspirone] Nausea Only    Sweating and dizzy   Clarithromycin  Swelling   Codeine Nausea And Vomiting   Doxycycline Other (See Comments)    Unknown   Guaifenesin Other (See Comments)    Palpitations   Oxycodone Nausea And Vomiting   Prednisone Nausea And Vomiting and Other (See Comments)    Makes my heart race   Statins Other (See Comments)    Muscle pain   Sulfonamide Derivatives Nausea And Vomiting    Achiness   Tetracycline Nausea Only   Vicodin [Hydrocodone-Acetaminophen] Nausea And Vomiting   Famotidine Rash   Latex Rash   Omeprazole Nausea Only    Cough, shortness of breath - patient doesn't remember   Peanut-Containing Drug Products Itching and Rash   Protonix [Pantoprazole] Rash   Wellbutrin [Bupropion] Palpitations   MEDICATION LIST PRIOR TO VISIT: Current Outpatient Medications on File Prior to Visit  Medication Sig Dispense Refill   acetaminophen (TYLENOL) 500 MG tablet Take 1,000 mg by mouth 2 (two) times daily as needed (pain).     aspirin 81 MG chewable tablet Chew 1 tablet (81 mg total) by mouth daily. 30 tablet 0   carvedilol (COREG) 3.125 MG tablet TAKE 1 TABLET BY MOUTH TWICE A DAY WITH A MEAL 180 tablet 1   Cholecalciferol (VITAMIN D3 PO) Take 1,000 Units by mouth in the morning and at bedtime.     ELIQUIS 5 MG TABS tablet TAKE 1 TABLET BY MOUTH TWICE A DAY 180 tablet 1   ezetimibe (ZETIA) 10 MG tablet TAKE 1 TABLET BY MOUTH EVERY DAY 90 tablet 1   famotidine (PEPCID) 20 MG tablet TAKE 1 TABLET BY MOUTH TWICE A DAY (Patient taking differently: Take 20 mg by mouth daily.) 180 tablet 1   furosemide (  LASIX) 20 MG tablet TAKE 1 TABLET BY MOUTH TWICE A DAY 180 tablet 1   Magnesium 400 MG TABS Take 400 mg by mouth 2 (two) times daily.      rosuvastatin (CRESTOR) 10 MG tablet TAKE 1 TABLET (10 MG TOTAL) BY MOUTH DAILY AT 6 PM. 90 tablet 1   sertraline (ZOLOFT) 50 MG tablet TAKE 1 TABLET BY MOUTH EVERYDAY AT BEDTIME 90 tablet 1   vitamin B-12 (CYANOCOBALAMIN) 1000 MCG tablet Take 1,000 mcg by mouth daily.     No current  facility-administered medications on file prior to visit.    PAST MEDICAL HISTORY: Past Medical History:  Diagnosis Date   Allergy    Anxiety    Arnold-Chiari malformation (HCC)    Arthritis    CHF (congestive heart failure) ([employer])    Chronic systolic heart failure ([place]) 11/03/2019   Coronary artery disease    Dyspnea    Encounter for assessment of implantable cardioverter-defibrillator (ICD) 11/10/2019   GERD (gastroesophageal reflux disease)    H. pylori infection    3 years ago   H/O cardiac arrest    Hx of VT/Vfib arrest   Hyperlipidemia    ICD BiV Medtronic model [id number] Claria Quad CRT-D SureScan ICD 08/25/2019    Incontinence    Paroxysmal atrial fibrillation ([place]) 08/06/2019   Pulmonary edema 2021   Syncope and collapse    Per pt, denies passing out   Tobacco use disorder    Unspecified essential hypertension     PAST SURGICAL HISTORY: Past Surgical History:  Procedure Laterality Date   APPENDECTOMY     ARNOLD CHIARI SURGERY     neurocranial surgery   BIV ICD INSERTION CRT-D N/A 08/25/2019   Procedure: BIV ICD INSERTION CRT-D;  Surgeon: Constance Haw, MD;  Location: [facility];  Service: Cardiovascular;  Laterality: N/A;   BLEPHAROPLASTY     Bil   C-EYE SURGERY PROCEDURE     CARDIAC CATHETERIZATION     CHOLECYSTECTOMY     EYE SURGERY     heart failure     LEFT HEART CATH AND CORONARY ANGIOGRAPHY N/A 08/04/2019   Procedure: LEFT HEART CATH AND CORONARY ANGIOGRAPHY;  Surgeon: Adrian Prows, MD;  Location: [facility];  Service: Cardiovascular;  Laterality: N/A;   MASS EXCISION Right 12/27/2013   Procedure: MINOR EXCISION OF RIGHT THUMB MUCOID CYST, DEBRIDEMENT OF INTERPHALANGEAL JOINT;  Surgeon: Cammie Sickle, MD;  Location: [facility];  Service: Orthopedics;  Laterality: Right;   mass on thumb  right   TOTAL KNEE ARTHROPLASTY Right 02/17/2017   TOTAL KNEE ARTHROPLASTY Right 02/17/2017   Procedure: TOTAL KNEE ARTHROPLASTY;   Surgeon: Melrose Nakayama, MD;  Location: [facility];  Service: Orthopedics;  Laterality: Right;   TUBAL LIGATION      FAMILY HISTORY: The patient family history includes Bone cancer in her mother; Cancer in her sister; Diverticulosis in her sister; Heart disease in her mother; Hypertension in her sister; Tuberculosis in her father.   SOCIAL HISTORY:  The patient  reports that she quit smoking about 2 years ago. Her smoking use included cigarettes. She has a 50.00 pack-year smoking history. She has never used smokeless tobacco. She reports that she does not drink alcohol and does not use drugs.  Review of Systems  Constitutional: Positive for weight gain. Negative for chills, fever and weight loss.  HENT:  Negative for hoarse voice and nosebleeds.   Eyes:  Negative for discharge, double vision and  pain.  Cardiovascular:  Negative for chest pain, claudication, dyspnea on exertion, leg swelling, near-syncope, orthopnea, palpitations, paroxysmal nocturnal dyspnea and syncope.  Respiratory:  Negative for hemoptysis and shortness of breath.   Musculoskeletal:  Negative for muscle cramps and myalgias.  Gastrointestinal:  Negative for abdominal pain, constipation, diarrhea, hematemesis, hematochezia, melena, nausea and vomiting.  Neurological:  Negative for dizziness and light-headedness.    PHYSICAL EXAM:    07/21/2022    1:25 PM 05/01/2022   12:07 PM 01/16/2022    1:26 PM  Vitals with BMI  Height '5\' 4"'$  '5\' 4"'$  '5\' 4"'$   Weight 164 lbs 3 oz 159 lbs 3 oz 154 lbs  BMI 28.17 16.10 96.04  Systolic 540 981 191  Diastolic 79 64 60  Pulse 91 88 84   Physical Exam  Constitutional: No distress.  Appears older than stated age, hemodynamically stable.   Neck: No JVD present.  Cardiovascular: Normal rate, regular rhythm, S1 normal, S2 normal and intact distal pulses. Exam reveals no gallop, no S3 and no S4.  Murmur heard. Holosystolic murmur is present with a grade of 3/6 at the apex radiating to the  axilla. Pulses:      Femoral pulses are 2+ on the right side and 1+ on the left side. Pulmonary/Chest: Effort normal and breath sounds normal. No stridor. She has no wheezes. She has no rales.  BiV ICD noted left infraclavicular region.  Abdominal: Soft. Bowel sounds are normal. She exhibits no distension. There is no abdominal tenderness.  Musculoskeletal:        General: No edema.     Cervical back: Neck supple.  Neurological: She is alert and oriented to person, place, and time. She has intact cranial nerves (2-12).  Skin: Skin is warm and moist.   CARDIAC DATABASE: EKG: 08/12/2019: Normal sinus rhythm with ventricular rate of 61 bpm, left axis deviation, left bundle branch block, nonspecific T wave abnormalities.  Prior EKG dated 08/04/2019 shows a normal sinus rhythm, left axis deviation, left bundle branch block. 07/21/2022: Atrial paced ventricular sensed rhythm, 74 bpm, IVCD, TWI in inferior lateral leads consider ischemia.  Similar findings noted on prior EKG dated 01/16/2022  Echocardiogram: 12/20/2021: Left ventricle cavity is normal in size. Normal left ventricular wall thickness. Doppler evidence of grade I (impaired) diastolic dysfunction, normal LAP. Left ventricle regional wall motion findings: Global hypokinesis. Cannot exclude inferior akinesis.  Moderately depressed LV systolic function with visual EF 35-40%.  Left atrial cavity is mildly dilated. Right ventricle cavity is normal in size. Right ventricle pacemaker lead wires visualized. Normal right ventricular function. Mild (Grade I) mitral regurgitation. Mild tricuspid regurgitation. No evidence of pulmonary hypertension. Compared to the study done on 02/28/2020, LVEF has again marginally improved from 30-35% to a percent 35 to 40%, also in 2021, patient's LVEF was 25 to 30%. Inferior wall motion abnormality is new.  Heart Catheterization: 08/04/19:  LV: Global hypokinesis, upper limit of normal size, EF 25 to 30%. Left  main: Normal. LAD: Mild diffuse disease.  Proximal LAD has a 30 to 40% stenosis, mid segment has a 20 to 30% stenosis, scattered disease noted in the LAD.  Brisk flow. Circumflex: Again scattered disease noted in the circumflex.  Mid segment has at most a 30 to 40% stenosis which appears to be eccentric and calcified. RCA: Dominant.  Mild diffuse disease again noted.  Mid segment after the origin of RV branch has a 70 to 80% stenosis.  Brisk flow is evident throughout the RCA.  Impression: Findings consistent with nonischemic cardiomyopathy.  Although she has significant disease in the right coronary artery, this does not explain her presentation with global hypokinesis, neither does it explain VF arrest as the lesion does not appear to be unstable.   Biventricular ICD implantation 08/25/2019:  ICD BiV Medtronic model [id number] Claria Quad CRT-D SureScan ICD: Remote CRT-D transmission 07/17/2022: Longevity 5 years and 4 months.  AP 95 %, VP 55%.  Lead impedance and thresholds are normal.  Brief ventricular and atrial monitoring episodes, EGM = Brief AF episodes, longest 31 minutes.  AT/AF burden <0.1%  Thoracic impedance is at baseline and does not suggest volume overload state.  Carotid duplex: 08/03/2019: Right Carotid: Velocities in the right ICA are consistent with a 1-39% stenosis. Left Carotid: Velocities in the left ICA are consistent with a 1-39%  stenosis. Vertebrals:  Bilateral vertebral arteries demonstrate antegrade flow. Subclavians: Normal flow hemodynamics were seen in bilateral subclavian  arteries.  LABORATORY DATA:    Latest Ref Rng & Units 07/30/2020    5:04 PM 06/12/2020    2:56 AM 04/23/2020   12:52 PM  CBC  WBC 4.0 - 10.5 K/uL 6.0  6.0  5.4   Hemoglobin 12.0 - 15.0 g/dL 11.2  10.6  10.7   Hematocrit 36.0 - 46.0 % 34.5  32.4  32.8   Platelets 150 - 400 K/uL 196  173  223.0        Latest Ref Rng & Units 05/01/2022   12:36 PM 09/27/2021    8:56 AM 10/25/2020    3:21 PM   CMP  Glucose 70 - 99 mg/dL 88  87  89   BUN 6 - 23 mg/dL '18  21  29   '$ Creatinine 0.40 - 1.20 mg/dL 2.17  2.18  2.60   Sodium 135 - 145 mEq/L 139  140  142   Potassium 3.5 - 5.1 mEq/L 3.9  4.2  4.3   Chloride 96 - 112 mEq/L 101  102  104   CO2 19 - 32 mEq/L 32  29  28   Calcium 8.4 - 10.5 mg/dL 9.2  9.2  9.1   Total Protein 6.0 - 8.3 g/dL 7.4  7.1    Total Bilirubin 0.2 - 1.2 mg/dL 0.5  0.5    Alkaline Phos 39 - 117 U/L 62  57    AST 0 - 37 U/L 25  21    ALT 0 - 35 U/L 13  8      Lipid Panel  Lab Results  Component Value Date   CHOL 129 05/01/2022   HDL 44.50 05/01/2022   LDLCALC 59 05/01/2022   LDLDIRECT 193.0 09/02/2016   TRIG 131.0 05/01/2022   CHOLHDL 3 05/01/2022    Lab Results  Component Value Date   HGBA1C 5.7 (H) 08/03/2019   No components found for: "NTPROBNP" Lab Results  Component Value Date   TSH 1.487 12/07/2019   TSH 0.550 08/24/2019   TSH 0.75 08/23/2019   External Labs: Collected: 05/28/2021 at [facility] BUN 27, creatinine 2.29 mg/dL Sodium 140, potassium 4.2, chloride 104, bicarb 30 Total cholesterol 136 HDL 44, LDL 62,  triglycerides 179 Hemoglobin 11.6 g/dL.  FINAL MEDICATION LIST END OF ENCOUNTER:   Current Outpatient Medications:    acetaminophen (TYLENOL) 500 MG tablet, Take 1,000 mg by mouth 2 (two) times daily as needed (pain)., Disp: , Rfl:    aspirin 81 MG chewable tablet, Chew 1 tablet (81 mg total) by mouth daily., Disp: 30 tablet,  Rfl: 0   carvedilol (COREG) 3.125 MG tablet, TAKE 1 TABLET BY MOUTH TWICE A DAY WITH A MEAL, Disp: 180 tablet, Rfl: 1   Cholecalciferol (VITAMIN D3 PO), Take 1,000 Units by mouth in the morning and at bedtime., Disp: , Rfl:    ELIQUIS 5 MG TABS tablet, TAKE 1 TABLET BY MOUTH TWICE A DAY, Disp: 180 tablet, Rfl: 1   ezetimibe (ZETIA) 10 MG tablet, TAKE 1 TABLET BY MOUTH EVERY DAY, Disp: 90 tablet, Rfl: 1   famotidine (PEPCID) 20 MG tablet, TAKE 1 TABLET BY MOUTH TWICE A DAY (Patient taking differently: Take 20  mg by mouth daily.), Disp: 180 tablet, Rfl: 1   furosemide (LASIX) 20 MG tablet, TAKE 1 TABLET BY MOUTH TWICE A DAY, Disp: 180 tablet, Rfl: 1   Magnesium 400 MG TABS, Take 400 mg by mouth 2 (two) times daily. , Disp: , Rfl:    rosuvastatin (CRESTOR) 10 MG tablet, TAKE 1 TABLET (10 MG TOTAL) BY MOUTH DAILY AT 6 PM., Disp: 90 tablet, Rfl: 1   sertraline (ZOLOFT) 50 MG tablet, TAKE 1 TABLET BY MOUTH EVERYDAY AT BEDTIME, Disp: 90 tablet, Rfl: 1   vitamin B-12 (CYANOCOBALAMIN) 1000 MCG tablet, Take 1,000 mcg by mouth daily., Disp: , Rfl:   IMPRESSION:    ICD-10-CM   1. Chronic HFrEF (heart failure with reduced ejection fraction) (HCC)  I50.22 EKG 12-Lead    2. Nonischemic cardiomyopathy (HCC)  I42.8 EKG 12-Lead    3. Biventricular ICD (implantable cardioverter-defibrillator) in place  Z95.810     4. Left bundle branch block  I44.7     5. History of cardiac arrest  Z86.74     6. History of torsades de pointes  Z86.79     7. Paroxysmal atrial fibrillation (HCC)  I48.0 EKG 12-Lead    8. Long term current use of anticoagulant  Z79.01     9. 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 Mixed hyperlipidemia  E78.2     11. History of CVA (cerebrovascular accident)  Z86.73 PCV CAROTID DUPLEX (BILATERAL)    12. Mild atherosclerosis of both carotid arteries  I65.23 PCV CAROTID DUPLEX (BILATERAL)    13. Former smoker  Z87.891        RECOMMENDATIONS: [name] is a 72 y.o. female whose past medical history and cardiac risk factors include: Hypertension, hyperlipidemia, former smoker, history of Arnold-Chiari malformation, history of VT/VF, history of torsades, cardiac arrest, nonischemic cardiomyopathy, peripheral vascular disease, paroxysmal atrial fibrillation, left bundle branch block, history of CVA.  Chronic HFrEF (heart failure with reduced ejection fraction) (HCC) Cardiomyopathy, suspect nonischemic (HCC) Stage C, NYHA class  II Euvolemic. Last hospitalizations: December 2021, February 2022 for heart failure. Most recent remote pacemaker/ICD interrogation reviewed thoracic impedance within normal limits. Her blood pressures are much better controlled compared to the past.  Discussed uptitration of GDMT with a trial of Jardiance and ARB/ARNI.  However, patient appears reluctant given her history of chronic kidney disease stage IV and prior hospitalizations for acute kidney injury and hyperkalemia. Patient understands that she is not on optimal medical therapy for reasons mentioned above. Monitor for now.  Biventricular ICD (implantable cardioverter-defibrillator) in place Recent pacemaker interrogation reviewed -from February 2024 Schedule for an office device check  Paroxysmal atrial fibrillation ([place]) Rate control: Carvedilol. Rhythm control: N/A. Thromboembolic prophylaxis: Eliquis Reemphasized the risks, benefits, and alternatives to oral anticoagulation. AT/AF burden <0.1%  per last interrogation report [id number] SCORE is 7 which correlates to 9.6% risk of stroke per year (CHF, HTN, age, history of stroke, PVD, gender).   Essential hypertension Chronic kidney disease, stage IV (severe) ([place]) Office blood pressures are within acceptable limits. I have asked her to keep a log of her blood pressures and to review with either myself or PCP. Recently had labs with nephrology-records requested. Will defer volume management/diuretics to nephrology given her chronic kidney disease stage IV  Carotid artery atherosclerosis, bilateral: History of CVA Prior history of bilateral carotid artery atherosclerosis as of 2021. Will repeat carotid duplex to reevaluate disease progression  Orders Placed This Encounter  Procedures   EKG 12-Lead   PCV CAROTID DUPLEX (BILATERAL)   --Continue cardiac medications as reconciled in final medication list. --Return in about 6 months (around 01/19/2023) for Follow up, heart  failure management... Or sooner if needed. --Continue follow-up with your primary care physician regarding the management of your other chronic comorbid conditions.  Patient's questions and concerns were addressed to her and her daughter's satisfaction. She and her daughter voices understanding of the instructions provided during this encounter.   This note was created using a voice recognition software as a result there may be grammatical errors inadvertently enclosed that do not reflect the nature of this encounter. Every attempt is made to correct such errors.   Rex Kras, [place], [facility]  Pager: [phone] Office: [phone]

## 2022-07-25 ENCOUNTER — Encounter: Payer: Medicare HMO | Admitting: Cardiology

## 2022-07-31 ENCOUNTER — Other Ambulatory Visit: Payer: Self-pay | Admitting: Cardiology

## 2022-07-31 DIAGNOSIS — I480 Paroxysmal atrial fibrillation: Secondary | ICD-10-CM

## 2022-07-31 DIAGNOSIS — Z7901 Long term (current) use of anticoagulants: Secondary | ICD-10-CM

## 2022-08-06 NOTE — Progress Notes (Unsigned)
Chief Complaint  Patient presents with   ICD check   Encounter for assessment of implantable cardioverter-defibrillator (ICD)  ICD BiV Medtronic model [id number] Claria Quad CRT-D SureScan ICD 08/25/2019  HFrEF (heart failure with reduced ejection fraction) ([place])  Scheduled  In office ICD 08/06/22  Single (S)/Dual (D)/BV (M) *** Presenting *** Pacer dependant: ***. Underlying ***. AP ***%, VP ***%. BP ***.% AMS Episodes ***.  AT/AF burden ***%. Longest ***. Latest***. HVR ***. Longest ***, Latest ***.  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  Lead measurements: Stable Histogram: Low (L)/normal (N)/high (H)  *** Patient activity ***. Thoracic impedance: ***  Observations: ***.  Changes: ***

## 2022-08-07 ENCOUNTER — Ambulatory Visit: Payer: Medicare HMO | Admitting: Cardiology

## 2022-08-07 ENCOUNTER — Encounter: Payer: Self-pay | Admitting: Cardiology

## 2022-08-07 DIAGNOSIS — I469 Cardiac arrest, cause unspecified: Secondary | ICD-10-CM

## 2022-08-07 DIAGNOSIS — I5020 Unspecified systolic (congestive) heart failure: Secondary | ICD-10-CM | POA: Diagnosis not present

## 2022-08-07 DIAGNOSIS — Z95810 Presence of automatic (implantable) cardiac defibrillator: Secondary | ICD-10-CM | POA: Diagnosis not present

## 2022-08-07 DIAGNOSIS — I4901 Ventricular fibrillation: Secondary | ICD-10-CM | POA: Diagnosis not present

## 2022-08-07 DIAGNOSIS — Z4502 Encounter for adjustment and management of automatic implantable cardiac defibrillator: Secondary | ICD-10-CM

## 2022-08-17 DIAGNOSIS — I5022 Chronic systolic (congestive) heart failure: Secondary | ICD-10-CM | POA: Diagnosis not present

## 2022-08-17 DIAGNOSIS — Z4502 Encounter for adjustment and management of automatic implantable cardiac defibrillator: Secondary | ICD-10-CM | POA: Diagnosis not present

## 2022-08-17 DIAGNOSIS — Z95810 Presence of automatic (implantable) cardiac defibrillator: Secondary | ICD-10-CM | POA: Diagnosis not present

## 2022-08-17 DIAGNOSIS — I4901 Ventricular fibrillation: Secondary | ICD-10-CM | POA: Diagnosis not present

## 2022-08-18 DIAGNOSIS — M19041 Primary osteoarthritis, right hand: Secondary | ICD-10-CM | POA: Diagnosis not present

## 2022-08-18 DIAGNOSIS — M1811 Unilateral primary osteoarthritis of first carpometacarpal joint, right hand: Secondary | ICD-10-CM | POA: Diagnosis not present

## 2022-08-18 DIAGNOSIS — M79641 Pain in right hand: Secondary | ICD-10-CM | POA: Diagnosis not present

## 2022-08-29 NOTE — Progress Notes (Addendum)
[facility] Note  09/08/2022     CHIEF COMPLAINT Patient presents for Retina Follow Up   HISTORY OF PRESENT ILLNESS: [name] is a 72 y.o. female who presents to the clinic today for:   HPI     Retina Follow Up   Patient presents with  Other.  In both eyes.  This started years ago.  Severity is moderate.  Duration of 2 years.  I, the attending physician,  performed the HPI with the patient and updated documentation appropriately.        Comments   Patient feels that there has been a slight change in her vision. She was told by Dr. Gershon Crane that the cataracts need to come off. She is not using any eye drops at this time.       Last edited by Bernarda Caffey, MD on 09/10/2022 12:34 AM.    Pt states Dr. 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 Aron Baba for retinoschisis OU LEE: 08.29.19 (K. Hallahan) [BCVA: OD: 20/20 OS: 20/20-] Ocular Hx-cataracts OU, glaucoma suspect OU PMH-anxiety, HTN, current smoker    CURRENT MEDICATIONS: No current outpatient medications on file. (Ophthalmic Drugs)   No current facility-administered medications for this visit. (Ophthalmic Drugs)   Current Outpatient Medications (Other)  Medication Sig   acetaminophen (TYLENOL) 500 MG tablet Take 1,000 mg by mouth 2 (two) times daily as needed (pain).   aspirin 81 MG chewable tablet Chew 1 tablet (81 mg total) by mouth daily.   carvedilol (COREG) 3.125 MG tablet TAKE 1 TABLET BY MOUTH TWICE A DAY WITH A MEAL   Cholecalciferol (VITAMIN D3 PO) Take 1,000 Units by mouth in the morning and at bedtime.   ELIQUIS 5 MG TABS tablet TAKE 1 TABLET BY MOUTH TWICE A DAY   ezetimibe (ZETIA) 10 MG tablet TAKE 1 TABLET BY MOUTH EVERY DAY   famotidine  (PEPCID) 20 MG tablet TAKE 1 TABLET BY MOUTH TWICE A DAY (Patient taking differently: Take 20 mg by mouth daily.)   furosemide (LASIX) 20 MG tablet TAKE 1 TABLET BY MOUTH TWICE A DAY   Magnesium 400 MG TABS Take 400 mg by mouth 2 (two) times daily.    rosuvastatin (CRESTOR) 10 MG tablet TAKE 1 TABLET (10 MG TOTAL) BY MOUTH DAILY AT 6 PM.   sertraline (ZOLOFT) 50 MG tablet TAKE 1 TABLET BY MOUTH EVERYDAY AT BEDTIME   vitamin B-12 (CYANOCOBALAMIN) 1000 MCG tablet Take 1,000 mcg by mouth daily.   No current facility-administered medications for this visit. (Other)   REVIEW OF SYSTEMS: ROS   Positive for: Gastrointestinal, Neurological, Musculoskeletal, Cardiovascular, Eyes Negative for: Constitutional, Skin, Genitourinary, HENT, Endocrine, Respiratory, Psychiatric, Allergic/Imm, Heme/Lymph Last edited by Annie Paras, COT on 09/08/2022  1:36 PM.     ALLERGIES Allergies  Allergen Reactions   Shellfish-Derived Products Anaphylaxis   Ativan [Lorazepam] Other (See Comments)    Makes "skin crawl" and insomnia   Buspar [Buspirone] Nausea Only    Sweating and dizzy   Clarithromycin Swelling   Codeine Nausea And Vomiting   Doxycycline Other (See Comments)    Unknown   Guaifenesin Other (See Comments)    Palpitations   Oxycodone Nausea And Vomiting   Prednisone  Nausea And Vomiting and Other (See Comments)    Makes my heart race   Statins Other (See Comments)    Muscle pain   Sulfonamide Derivatives Nausea And Vomiting    Achiness   Tetracycline Nausea Only   Vicodin [Hydrocodone-Acetaminophen] Nausea And Vomiting   Famotidine Rash   Latex Rash   Omeprazole Nausea Only    Cough, shortness of breath - patient doesn't remember   Peanut-Containing Drug Products Itching and Rash   Protonix [Pantoprazole] Rash   Wellbutrin [Bupropion] Palpitations   PAST MEDICAL HISTORY Past Medical History:  Diagnosis Date   Allergy    Anxiety    Arnold-Chiari malformation (HCC)     Arthritis    CHF (congestive heart failure) (HCC)    Chronic systolic heart failure ([facility]) 11/03/2019   Coronary artery disease    Dyspnea    Encounter for assessment of implantable cardioverter-defibrillator (ICD) 11/10/2019   GERD (gastroesophageal reflux disease)    H. pylori infection    3 years ago   H/O cardiac arrest    Hx of VT/Vfib arrest   Hyperlipidemia    ICD BiV Medtronic model [id number] [place] Quad CRT-D SureScan ICD 08/25/2019    Incontinence    Paroxysmal atrial fibrillation ([facility]) 08/06/2019   Pulmonary edema 2021   Syncope and collapse    Per pt, denies passing out   Tobacco use disorder    Unspecified essential hypertension    Past Surgical History:  Procedure Laterality Date   APPENDECTOMY     ARNOLD CHIARI SURGERY     neurocranial surgery   BIV ICD INSERTION CRT-D N/A 08/25/2019   Procedure: BIV ICD INSERTION CRT-D;  Surgeon: Constance Haw, MD;  Location: [facility];  Service: Cardiovascular;  Laterality: N/A;   BLEPHAROPLASTY     [facility]   C-EYE SURGERY PROCEDURE     CARDIAC CATHETERIZATION     CHOLECYSTECTOMY     EYE SURGERY     heart failure     LEFT HEART CATH AND CORONARY ANGIOGRAPHY N/A 08/04/2019   Procedure: LEFT HEART CATH AND CORONARY ANGIOGRAPHY;  Surgeon: Adrian Prows, MD;  Location: [facility];  Service: Cardiovascular;  Laterality: N/A;   MASS EXCISION Right 12/27/2013   Procedure: MINOR EXCISION OF RIGHT THUMB MUCOID CYST, DEBRIDEMENT OF INTERPHALANGEAL JOINT;  Surgeon: Cammie Sickle, MD;  Location: [facility];  Service: Orthopedics;  Laterality: Right;   mass on thumb  right   TOTAL KNEE ARTHROPLASTY Right 02/17/2017   TOTAL KNEE ARTHROPLASTY Right 02/17/2017   Procedure: TOTAL KNEE ARTHROPLASTY;  Surgeon: Melrose Nakayama, MD;  Location: [facility];  Service: Orthopedics;  Laterality: Right;   TUBAL LIGATION     FAMILY HISTORY Family History  Problem Relation Age of Onset   Bone cancer Mother    Heart disease  Mother    Cancer Sister        metastatic; unknown primary   Diverticulosis Sister    Tuberculosis Father    Hypertension Sister    Colon cancer Neg Hx    Esophageal cancer Neg Hx    Pancreatic cancer Neg Hx    Stomach cancer Neg Hx    Liver disease Neg Hx    Kidney disease Neg Hx    Rectal cancer Neg Hx    Pancreatic disease Neg Hx    SOCIAL HISTORY Social History   Tobacco Use   Smoking status: Former    Packs/day: 1.00    Years: 50.00  Additional pack years: 0.00    Total pack years: 50.00    Types: Cigarettes    Quit date: 08/08/2019    Years since quitting: 3.0   Smokeless tobacco: Never  Vaping Use   Vaping Use: Never used  Substance Use Topics   Alcohol use: No    Alcohol/week: 0.0 standard drinks of alcohol   Drug use: No       OPHTHALMIC EXAM:  Base Eye Exam     Visual Acuity (Snellen - Linear)       Right Left   Dist cc 20/60 +2 20/60 -2   Dist ph cc 20/40 +2 20/40 +2         Tonometry (Tonopen, 1:41 PM)       Right Left   Pressure 15 16         Pupils       Dark Light Shape React APD   Right 4 3 Round Brisk None   Left 4 3 Round Brisk None         Visual Fields       Left Right    Full Full         Extraocular Movement       Right Left    Full, Ortho Full, Ortho         Neuro/Psych     Oriented x3: Yes   Mood/Affect: Normal         Dilation     Both eyes: 1.0% Mydriacyl, 2.5% Phenylephrine @ 1:37 PM           Slit Lamp and Fundus Exam     Slit Lamp Exam       Right Left   Lids/Lashes Dermatochalasis - upper lid Dermatochalasis - upper lid, Meibomian gland dysfunction   Conjunctiva/Sclera Nasal and Temporal Pinguecula Nasal and Temporal Pinguecula   Cornea Arcus, trace Punctate epithelial erosions Arcus, trace PEE   Anterior Chamber deep, clear, narrow temporal angle deep, clear, narrow temporal angle   Iris Round and dilated Round and dilated   Lens 3+ Nuclear sclerosis with brunescence, 3+  Cortical cataract 3+ Nuclear sclerosis with brunescence, 3+ Cortical cataract   Anterior Vitreous Vitreous syneresis Vitreous syneresis         Fundus Exam       Right Left   Disc Pink and Sharp Pink and Sharp, mild central Pallor, +cupping   C/D Ratio 0.6 0.7   Macula blunted foveal reflex, scattered MA, no edema, +druplets, RPE mottling and clumping Flat, blunted foveal reflex, Retinal pigment epithelial mottling and clumping, +drusen, No heme or edema   Vessels attenuated, Tortuous attenuated, Tortuous   Periphery Attached, bullous retinoschisis cavity inferiorly from 0600 to 0730 - mild posterior progression, inferior reticular degeneration, No RT/RD on 360 exam Attached, bullous retinoschisis cavity from 0430 to 0600 -- mild posterior progression, reticular degeneration, RPE mottling, No RT/RD           Refraction     Wearing Rx       Sphere Cylinder Axis Add   Right +0.75 +0.75 158 +1.75   Left +0.25 +0.50 032 +1.75    Type: PAL            IMAGING AND PROCEDURES  Imaging and Procedures for @TODAY @  OCT, Retina - OU - Both Eyes       Right Eye Quality was good. Central Foveal Thickness: 278. Progression has been stable. Findings include normal foveal contour, no IRF, no SRF, retinal drusen (  Inferotemporal retinoschisis caught on widefield OCT - stable).   Left Eye Quality was good. Central Foveal Thickness: 268. Progression has been stable. Findings include normal foveal contour, no IRF, no SRF, retinal drusen (Inferotemporal retinoschisis caught on widefield OCT).   Notes *Images captured and stored on drive  Diagnosis / Impression:  Inferotemporal retinoschisis OU -- caught on widefield OCT and stable from prior Retinal drusen OU   Clinical management:  See below  Abbreviations: NFP - Normal foveal profile. CME - cystoid macular edema. PED - pigment epithelial detachment. IRF - intraretinal fluid. SRF - subretinal fluid. EZ - ellipsoid zone. ERM -  epiretinal membrane. ORA - outer retinal atrophy. ORT - outer retinal tubulation. SRHM - subretinal hyper-reflective material       Color Fundus Photography Optos - OU - Both Eyes       Right Eye Progression has worsened. Disc findings include normal observations. Macula : drusen, hemorrhage. Vessels : tortuous vessels, attenuated. Periphery : (Bullous retinoschisis cavity from 6-730 extending posteriorly to midzone).   Left Eye Progression has worsened. Disc findings include normal observations. Macula : drusen. Vessels : tortuous vessels, attenuated. Periphery : (Bullous retinoschisis cavity from 430-6 extending posteriorly to midzone).   Notes **Images stored on drive**  Impression: bullous peripheral retinoschisis OU -- posterior progression OU compared to initial images in 2019           ASSESSMENT/PLAN:    ICD-10-CM   1. 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 Essential hypertension  I10     5. Hypertensive retinopathy of both eyes  H35.033     6. Combined forms of age-related cataract of both eyes  H25.813      1. Retinoschisis OU-   - bullous, inferotemporal retinoschisis cavities OU (OD > OS)   - schisis confirmed by widefield OCT -- stable  - no retinal tears or holes on depressed exam  - fairly large cavity with significant posterior extension -- notable in comparison to 02/2018 baseline images  - discussed findings, prognosis and possible need for treatment due to posterior progression  - recommend non-emergent laser retinopexy OU, OD first  - F/U next week for laser retinopexy OD, sooner prn  2. Retinal drusen OU  - mild OCT findings -- stable from prior  - monitor  3. Hx of refractile arteriolar plaque superiorly OD  - located at [place] midzone at arteriolar bifurcation -- not  present today ?resolved  - good distal perfusion   - monitor  4,5. Hypertensive retinopathy OU  - discussed importance of tight BP control  - monitor  6. Combined form age-related cataract OU-   - The symptoms of cataract, surgical options, and treatments and risks were discussed with patient.  - discussed diagnosis and progression  - under the expert management of Dr. Gershon Crane   Ophthalmic Meds Ordered this visit:  No orders of the defined types were placed in this encounter.    Return for f/u next week for laser retinopexy OD for retinoschisis, DFE, OCT.  There are no Patient Instructions on file for this visit.   Explained the diagnoses, plan, and follow up with the patient and they expressed understanding.  Patient expressed understanding of the importance of proper  follow up care.    This document serves as a record of services personally performed by Gardiner Sleeper, MD, PhD. It was created on their behalf by Orvan Falconer, an ophthalmic technician. The creation of this record is the provider's dictation and/or activities during the visit.    Electronically signed by: Orvan Falconer, OA, 09/10/22  12:35 AM  This document serves as a record of services personally performed by Gardiner Sleeper, MD, PhD. It was created on their behalf by San Jetty. Owens Shark, OA an ophthalmic technician. The creation of this record is the provider's dictation and/or activities during the visit.    Electronically signed by: San Jetty. Owens Shark, [place] 03.25.2024 12:35 AM  Gardiner Sleeper, M.D., Ph.D. Diseases & Surgery of the Retina and Vitreous [facility]  I have reviewed the above documentation for accuracy and completeness, and I agree with the above. Gardiner Sleeper, M.D., Ph.D. 09/10/22 12:38 AM   Abbreviations: M myopia (nearsighted); A astigmatism; H hyperopia (farsighted); P presbyopia; Mrx spectacle prescription;  CTL contact lenses; OD right eye; OS left eye; OU  both eyes  XT exotropia; ET esotropia; PEK punctate epithelial keratitis; PEE punctate epithelial erosions; DES dry eye syndrome; MGD meibomian gland dysfunction; ATs artificial tears; PFAT's preservative free artificial tears; [place] nuclear sclerotic cataract; PSC posterior subcapsular cataract; ERM epi-retinal membrane; PVD posterior vitreous detachment; RD retinal detachment; DM diabetes mellitus; DR diabetic retinopathy; NPDR non-proliferative diabetic retinopathy; PDR proliferative diabetic retinopathy; CSME clinically significant macular edema; DME diabetic macular edema; dbh dot blot hemorrhages; CWS cotton wool spot; POAG primary open angle glaucoma; C/D cup-to-disc ratio; HVF humphrey visual field; GVF goldmann visual field; OCT optical coherence tomography; IOP intraocular pressure; BRVO Branch retinal vein occlusion; CRVO central retinal vein occlusion; CRAO central retinal artery occlusion; BRAO branch retinal artery occlusion; RT retinal tear; SB scleral buckle; PPV pars plana vitrectomy; VH Vitreous hemorrhage; PRP panretinal laser photocoagulation; IVK intravitreal kenalog; VMT vitreomacular traction; MH Macular hole;  NVD neovascularization of the disc; NVE neovascularization elsewhere; AREDS age related eye disease study; ARMD age related macular degeneration; POAG primary open angle glaucoma; EBMD epithelial/anterior basement membrane dystrophy; ACIOL anterior chamber intraocular lens; IOL intraocular lens; PCIOL posterior chamber intraocular lens; Phaco/IOL phacoemulsification with intraocular lens placement; [place] photorefractive keratectomy; LASIK laser assisted in situ keratomileusis; HTN hypertension; DM diabetes mellitus; COPD chronic obstructive pulmonary disease

## 2022-09-04 ENCOUNTER — Other Ambulatory Visit: Payer: Self-pay | Admitting: Family Medicine

## 2022-09-04 DIAGNOSIS — Z1231 Encounter for screening mammogram for malignant neoplasm of breast: Secondary | ICD-10-CM

## 2022-09-08 ENCOUNTER — Ambulatory Visit (INDEPENDENT_AMBULATORY_CARE_PROVIDER_SITE_OTHER): Payer: Medicare HMO | Admitting: Ophthalmology

## 2022-09-08 DIAGNOSIS — H35363 Drusen (degenerative) of macula, bilateral: Secondary | ICD-10-CM

## 2022-09-08 DIAGNOSIS — H35033 Hypertensive retinopathy, bilateral: Secondary | ICD-10-CM | POA: Diagnosis not present

## 2022-09-08 DIAGNOSIS — H25813 Combined forms of age-related cataract, bilateral: Secondary | ICD-10-CM

## 2022-09-08 DIAGNOSIS — H3509 Other intraretinal microvascular abnormalities: Secondary | ICD-10-CM | POA: Diagnosis not present

## 2022-09-08 DIAGNOSIS — H33103 Unspecified retinoschisis, bilateral: Secondary | ICD-10-CM

## 2022-09-08 DIAGNOSIS — I10 Essential (primary) hypertension: Secondary | ICD-10-CM

## 2022-09-08 DIAGNOSIS — I708 Atherosclerosis of other arteries: Secondary | ICD-10-CM

## 2022-09-10 ENCOUNTER — Ambulatory Visit (INDEPENDENT_AMBULATORY_CARE_PROVIDER_SITE_OTHER): Payer: Medicare HMO | Admitting: Family Medicine

## 2022-09-10 ENCOUNTER — Encounter (INDEPENDENT_AMBULATORY_CARE_PROVIDER_SITE_OTHER): Payer: Self-pay | Admitting: Ophthalmology

## 2022-09-10 ENCOUNTER — Encounter: Payer: Self-pay | Admitting: Family Medicine

## 2022-09-10 VITALS — BP 110/70 | HR 54 | Temp 97.5°F | Ht 64.0 in

## 2022-09-10 DIAGNOSIS — L0100 Impetigo, unspecified: Secondary | ICD-10-CM | POA: Diagnosis not present

## 2022-09-10 DIAGNOSIS — J0101 Acute recurrent maxillary sinusitis: Secondary | ICD-10-CM | POA: Diagnosis not present

## 2022-09-10 MED ORDER — AMOXICILLIN 500 MG PO CAPS: 1000.0000 mg | ORAL_CAPSULE | Freq: Two times a day (BID) | ORAL | 0 refills | Status: DC

## 2022-09-10 NOTE — Patient Instructions (Addendum)
Complete antibiotics.  Can start flonase 2 sprays per nostril daily to help fluid from left ear drain and continue coricidin.  Got to Er if severe shortness of breath.

## 2022-09-10 NOTE — Assessment & Plan Note (Signed)
Acute, symptoms possibly viral versus allergic.  Continue using Coricidin and start Flonase 2 sprays per nostril daily and can consider nasal saline irrigation. Will treat impetigo with amoxicillin and this will also cover for possible bacterial superinfection.

## 2022-09-10 NOTE — Assessment & Plan Note (Signed)
Acute, rash at base of neck and near  Most consistent with staph infection/impetigo.  Less likely simple irritation from blowing nose. Will treat with antibiotics and patient can apply Neosporin cream as needed.  Return and ER precautions provided

## 2022-09-10 NOTE — Progress Notes (Signed)
Patient ID: [name], female    DOB: [birth year], 72 y.o.   MRN: [id number]  This visit was conducted in person.  BP 110/70   Pulse (!) 54   Temp (!) 97.5 F (36.4 C) (Temporal)   Ht 5\' 4"  (1.626 m)   SpO2 96%   BMI 28.18 kg/m    CC:  Chief Complaint  Patient presents with   Cough    "With rattle that hasn't gone away" x 2 weeks Having Laser Eye Surgery on Monday   Ear Pain    Left    Subjective:   HPI: [name] is a 72 y.o. female presenting on 09/10/2022 for Cough ("With rattle that hasn't gone away" x 2 weeks/Having Laser Eye Surgery on Monday) and Ear Pain (Left)   Date of onset: 2 weeks Initial symptoms included  congestion, ST. Started after sitting outside Symptoms progressed to  congestion, left ear pain, left face pain.  Sores under her nose.  Cough, productive, clear.  No fever.  No SO B, occ wheeze.   Has upcoming laser treatment on eye.   Sick contacts:  none COVID testing:   none     Coricidin helped some.    No history of chronic lung disease such as asthma or COPD.  Former smoker.       Relevant past medical, surgical, family and social history reviewed and updated as indicated. Interim medical history since our last visit reviewed. Allergies and medications reviewed and updated. Outpatient Medications Prior to Visit  Medication Sig Dispense Refill   acetaminophen (TYLENOL) 500 MG tablet Take 1,000 mg by mouth 2 (two) times daily as needed (pain).     aspirin 81 MG chewable tablet Chew 1 tablet (81 mg total) by mouth daily. 30 tablet 0   carvedilol (COREG) 3.125 MG tablet TAKE 1 TABLET BY MOUTH TWICE A DAY WITH A MEAL 180 tablet 1   Cholecalciferol (VITAMIN D3 PO) Take 1,000 Units by mouth in the morning and at bedtime.     ELIQUIS 5 MG TABS tablet TAKE 1 TABLET BY MOUTH TWICE A DAY 180 tablet 1   ezetimibe (ZETIA) 10 MG tablet TAKE 1 TABLET BY MOUTH EVERY DAY 90 tablet 1   famotidine (PEPCID) 20 MG tablet TAKE 1 TABLET BY MOUTH  TWICE A DAY (Patient taking differently: Take 20 mg by mouth daily.) 180 tablet 1   furosemide (LASIX) 20 MG tablet TAKE 1 TABLET BY MOUTH TWICE A DAY 180 tablet 1   Magnesium 400 MG TABS Take 400 mg by mouth 2 (two) times daily.      sertraline (ZOLOFT) 50 MG tablet TAKE 1 TABLET BY MOUTH EVERYDAY AT BEDTIME 90 tablet 1   vitamin B-12 (CYANOCOBALAMIN) 1000 MCG tablet Take 1,000 mcg by mouth daily.     rosuvastatin (CRESTOR) 10 MG tablet TAKE 1 TABLET (10 MG TOTAL) BY MOUTH DAILY AT 6 PM. 90 tablet 1   No facility-administered medications prior to visit.     Per HPI unless specifically indicated in ROS section below Review of Systems  Constitutional:  Negative for fatigue and fever.  HENT:  Positive for congestion, sinus pressure and sore throat.   Eyes:  Negative for pain.  Respiratory:  Positive for cough. Negative for shortness of breath.   Cardiovascular:  Negative for chest pain, palpitations and leg swelling.  Gastrointestinal:  Negative for abdominal pain.  Genitourinary:  Negative for dysuria and vaginal bleeding.  Musculoskeletal:  Negative for back pain.  Skin:  Positive for rash.  Neurological:  Negative for syncope, light-headedness and headaches.  Psychiatric/Behavioral:  Negative for dysphoric mood.    Objective:  BP 110/70   Pulse (!) 54   Temp (!) 97.5 F (36.4 C) (Temporal)   Ht 5\' 4"  (1.626 m)   SpO2 96%   BMI 28.18 kg/m   Wt Readings from Last 3 Encounters:  07/21/22 164 lb 3.2 oz (74.5 kg)  05/01/22 159 lb 3.2 oz (72.2 kg)  01/16/22 154 lb (69.9 kg)      Physical Exam Constitutional:      General: She is not in acute distress.    Appearance: She is well-developed. She is not ill-appearing or toxic-appearing.  HENT:     Head: Normocephalic.     Right Ear: Hearing, tympanic membrane, ear canal and external ear normal. Tympanic membrane is not erythematous, retracted or bulging.     Left Ear: Hearing, tympanic membrane, ear canal and external ear normal.  Tympanic membrane is not erythematous, retracted or bulging.     Nose: Mucosal edema and rhinorrhea present.     Right Sinus: No maxillary sinus tenderness or frontal sinus tenderness.     Left Sinus: No maxillary sinus tenderness or frontal sinus tenderness.     Mouth/Throat:     Pharynx: Uvula midline.  Eyes:     General: Lids are normal. Lids are everted, no foreign bodies appreciated.     Conjunctiva/sclera: Conjunctivae normal.     Pupils: Pupils are equal, round, and reactive to light.  Neck:     Thyroid: No thyroid mass or thyromegaly.     Vascular: No carotid bruit.     Trachea: Trachea normal.  Cardiovascular:     Rate and Rhythm: Normal rate and regular rhythm.     Pulses: Normal pulses.     Heart sounds: Normal heart sounds, S1 normal and S2 normal. No murmur heard.    No friction rub. No gallop.  Pulmonary:     Effort: Pulmonary effort is normal. No tachypnea or respiratory distress.     Breath sounds: Normal breath sounds. No decreased breath sounds, wheezing, rhonchi or rales.  Musculoskeletal:     Cervical back: Normal range of motion and neck supple.  Skin:    General: Skin is warm and dry.     Findings: No rash.  Neurological:     Mental Status: She is alert.  Psychiatric:        Mood and Affect: Mood is not anxious or depressed.        Speech: Speech normal.        Behavior: Behavior normal. Behavior is cooperative.        Judgment: Judgment normal.       Results for orders placed or performed in visit on 05/01/22  Lipid panel  Result Value Ref Range   Cholesterol 129 0 - 200 mg/dL   Triglycerides 131.0 0.0 - 149.0 mg/dL   HDL 44.50 >39.00 mg/dL   VLDL 26.2 0.0 - 40.0 mg/dL   LDL Cholesterol 59 0 - 99 mg/dL   Total CHOL/HDL Ratio 3    NonHDL 84.87   Comprehensive metabolic panel  Result Value Ref Range   Sodium 139 135 - 145 mEq/L   Potassium 3.9 3.5 - 5.1 mEq/L   Chloride 101 96 - 112 mEq/L   CO2 32 19 - 32 mEq/L   Glucose, Bld 88 70 - 99 mg/dL    BUN 18 6 - 23 mg/dL  Creatinine, Ser 2.17 (H) 0.40 - 1.20 mg/dL   Total Bilirubin 0.5 0.2 - 1.2 mg/dL   Alkaline Phosphatase 62 39 - 117 U/L   AST 25 0 - 37 U/L   ALT 13 0 - 35 U/L   Total Protein 7.4 6.0 - 8.3 g/dL   Albumin 4.5 3.5 - 5.2 g/dL   GFR 22.32 (L) >60.00 mL/min   Calcium 9.2 8.4 - 10.5 mg/dL    Assessment and Plan  Impetigo Assessment & Plan: Acute, rash at base of neck and near  Most consistent with staph infection/impetigo.  Less likely simple irritation from blowing nose. Will treat with antibiotics and patient can apply Neosporin cream as needed.  Return and ER precautions provided   Acute recurrent maxillary sinusitis Assessment & Plan: Acute, symptoms possibly viral versus allergic.  Continue using Coricidin and start Flonase 2 sprays per nostril daily and can consider nasal saline irrigation. Will treat impetigo with amoxicillin and this will also cover for possible bacterial superinfection.   Other orders -     Amoxicillin; Take 2 capsules (1,000 mg total) by mouth 2 (two) times daily.  Dispense: 40 capsule; Refill: 0    No follow-ups on file.   [name], MD

## 2022-09-15 ENCOUNTER — Encounter (INDEPENDENT_AMBULATORY_CARE_PROVIDER_SITE_OTHER): Payer: Medicare HMO | Admitting: Ophthalmology

## 2022-09-16 NOTE — Progress Notes (Signed)
[facility] - Clinic Note  09/22/2022     CHIEF COMPLAINT Patient presents for Retina Follow Up   HISTORY OF PRESENT ILLNESS: [name] is a 72 y.o. female who presents to the clinic today for:   HPI     Retina Follow Up   Patient presents with  Other.  In both eyes.  This started 2 weeks ago.  I, the attending physician,  performed the HPI with the patient and updated documentation appropriately.        Comments   Patient here for 2 weeks retina follow up for Retinoschisis OU/pexy OS. Patient states vision not do good. When watches tv has to get closer. No eye pain. Eye doctor said needs to have cataract surgery.      Last edited by Rennis Chris, MD on 09/22/2022  4:21 PM.       Referring physician: Excell Seltzer, MD [address] [place],  [place] [address]  HISTORICAL INFORMATION:   Selected notes from the MEDICAL RECORD NUMBER Referred by Dr. Council Mechanic for retinoschisis OU LEE: 08.29.19 (K. Hallahan) [BCVA: OD: 20/20 OS: 20/20-] Ocular Hx-cataracts OU, glaucoma suspect OU PMH-anxiety, HTN, current smoker    CURRENT MEDICATIONS: Current Outpatient Medications (Ophthalmic Drugs)  Medication Sig   prednisoLONE acetate (PRED FORTE) 1 % ophthalmic suspension Place 1 drop into the right eye 4 (four) times daily for 7 days.   No current facility-administered medications for this visit. (Ophthalmic Drugs)   Current Outpatient Medications (Other)  Medication Sig   acetaminophen (TYLENOL) 500 MG tablet Take 1,000 mg by mouth 2 (two) times daily as needed (pain).   amoxicillin (AMOXIL) 500 MG capsule Take 2 capsules (1,000 mg total) by mouth 2 (two) times daily.   aspirin 81 MG chewable tablet Chew 1 tablet (81 mg total) by mouth daily.   carvedilol (COREG) 3.125 MG tablet TAKE 1 TABLET BY MOUTH TWICE A DAY WITH A MEAL   Cholecalciferol (VITAMIN D3 PO) Take 1,000 Units by mouth in the morning and at bedtime.   ELIQUIS 5 MG TABS  tablet TAKE 1 TABLET BY MOUTH TWICE A DAY   ezetimibe (ZETIA) 10 MG tablet TAKE 1 TABLET BY MOUTH EVERY DAY   famotidine (PEPCID) 20 MG tablet TAKE 1 TABLET BY MOUTH TWICE A DAY (Patient taking differently: Take 20 mg by mouth daily.)   furosemide (LASIX) 20 MG tablet TAKE 1 TABLET BY MOUTH TWICE A DAY   Magnesium 400 MG TABS Take 400 mg by mouth 2 (two) times daily.    sertraline (ZOLOFT) 50 MG tablet TAKE 1 TABLET BY MOUTH EVERYDAY AT BEDTIME   vitamin B-12 (CYANOCOBALAMIN) 1000 MCG tablet Take 1,000 mcg by mouth daily.   rosuvastatin (CRESTOR) 10 MG tablet TAKE 1 TABLET (10 MG TOTAL) BY MOUTH DAILY AT 6 PM.   No current facility-administered medications for this visit. (Other)   REVIEW OF SYSTEMS: ROS   Positive for: Gastrointestinal, Neurological, Musculoskeletal, Cardiovascular, Eyes Negative for: Constitutional, Skin, Genitourinary, HENT, Endocrine, Respiratory, Psychiatric, Allergic/Imm, Heme/Lymph Last edited by Laddie Aquas, COA on 09/22/2022  2:41 PM.      ALLERGIES Allergies  Allergen Reactions   Shellfish-Derived Products Anaphylaxis   Ativan [Lorazepam] Other (See Comments)    Makes "skin crawl" and insomnia   Buspar [Buspirone] Nausea Only    Sweating and dizzy   Clarithromycin Swelling   Codeine Nausea And Vomiting   Doxycycline Other (See Comments)    Unknown  Guaifenesin Other (See Comments)    Palpitations   Oxycodone Nausea And Vomiting   Prednisone Nausea And Vomiting and Other (See Comments)    Makes my heart race   Statins Other (See Comments)    Muscle pain   Sulfonamide Derivatives Nausea And Vomiting    Achiness   Tetracycline Nausea Only   Vicodin [Hydrocodone-Acetaminophen] Nausea And Vomiting   Famotidine Rash   Latex Rash   Omeprazole Nausea Only    Cough, shortness of breath - patient doesn't remember   Peanut-Containing Drug Products Itching and Rash   Protonix [Pantoprazole] Rash   Wellbutrin [Bupropion] Palpitations   PAST MEDICAL  HISTORY Past Medical History:  Diagnosis Date   Allergy    Anxiety    Arnold-Chiari malformation    Arthritis    CHF (congestive heart failure)    Chronic systolic heart failure 11/03/2019   Coronary artery disease    Dyspnea    Encounter for assessment of implantable cardioverter-defibrillator (ICD) 11/10/2019   GERD (gastroesophageal reflux disease)    H. pylori infection    3 years ago   H/O cardiac arrest    Hx of VT/Vfib arrest   Hyperlipidemia    ICD BiV Medtronic model [id number] Claria Quad CRT-D SureScan ICD 08/25/2019    Incontinence    Paroxysmal atrial fibrillation 08/06/2019   Pulmonary edema 2021   Syncope and collapse    Per pt, denies passing out   Tobacco use disorder    Unspecified essential hypertension    Past Surgical History:  Procedure Laterality Date   APPENDECTOMY     ARNOLD CHIARI SURGERY     neurocranial surgery   BIV ICD INSERTION CRT-D N/A 08/25/2019   Procedure: BIV ICD INSERTION CRT-D;  Surgeon: Regan Lemming, MD;  [facility] INVASIVE CV LAB;  Service: Cardiovascular;  Laterality: N/A;   BLEPHAROPLASTY     Bil   C-EYE SURGERY PROCEDURE     CARDIAC CATHETERIZATION     CHOLECYSTECTOMY     EYE SURGERY     heart failure     LEFT HEART CATH AND CORONARY ANGIOGRAPHY N/A 08/04/2019   Procedure: LEFT HEART CATH AND CORONARY ANGIOGRAPHY;  Surgeon: Yates Decamp, MD;  Location: [facility] INVASIVE CV LAB;  Service: Cardiovascular;  Laterality: N/A;   MASS EXCISION Right 12/27/2013   Procedure: MINOR EXCISION OF RIGHT THUMB MUCOID CYST, DEBRIDEMENT OF INTERPHALANGEAL JOINT;  Surgeon: Wyn Forster, MD;  Location: [facility];  Service: Orthopedics;  Laterality: Right;   mass on thumb  right   TOTAL KNEE ARTHROPLASTY Right 02/17/2017   TOTAL KNEE ARTHROPLASTY Right 02/17/2017   Procedure: TOTAL KNEE ARTHROPLASTY;  Surgeon: Marcene Corning, MD;  Location: [facility] OR;  Service: Orthopedics;  Laterality: Right;   TUBAL LIGATION     FAMILY  HISTORY Family History  Problem Relation Age of Onset   Bone cancer Mother    Heart disease Mother    Cancer Sister        metastatic; unknown primary   Diverticulosis Sister    Tuberculosis Father    Hypertension Sister    Colon cancer Neg Hx    Esophageal cancer Neg Hx    Pancreatic cancer Neg Hx    Stomach cancer Neg Hx    Liver disease Neg Hx    Kidney disease Neg Hx    Rectal cancer Neg Hx    Pancreatic disease Neg Hx    SOCIAL HISTORY Social History   Tobacco Use   Smoking  status: Former    Packs/day: 1.00    Years: 50.00    Additional pack years: 0.00    Total pack years: 50.00    Types: Cigarettes    Quit date: 08/08/2019    Years since quitting: 3.1   Smokeless tobacco: Never  Vaping Use   Vaping Use: Never used  Substance Use Topics   Alcohol use: No    Alcohol/week: 0.0 standard drinks of alcohol   Drug use: No       OPHTHALMIC EXAM:  Base Eye Exam     Visual Acuity (Snellen - Linear)       Right Left   Dist [place] 20/60 20/40   Dist ph [place] 20/30 20/25 -1         Tonometry (Tonopen, 2:38 PM)       Right Left   Pressure 21 17         Pupils       Dark Light Shape React APD   Right 4 3 Round Brisk None   Left 4 3 Round Brisk None         Visual Fields (Counting fingers)       Left Right    Full Full         Extraocular Movement       Right Left    Full, Ortho Full, Ortho         Neuro/Psych     Oriented x3: Yes   Mood/Affect: Normal         Dilation     Both eyes: 1.0% Mydriacyl, 2.5% Phenylephrine @ 2:38 PM           Slit Lamp and Fundus Exam     Slit Lamp Exam       Right Left   Lids/Lashes Dermatochalasis - upper lid Dermatochalasis - upper lid, Meibomian gland dysfunction   Conjunctiva/Sclera Nasal and Temporal Pinguecula Nasal and Temporal Pinguecula   Cornea Arcus, trace Punctate epithelial erosions Arcus, trace PEE   Anterior Chamber deep, clear, narrow temporal angle deep, clear, narrow temporal  angle   Iris Round and dilated Round and dilated   Lens 3+ Nuclear sclerosis with brunescence, 3+ Cortical cataract 3+ Nuclear sclerosis with brunescence, 3+ Cortical cataract   Anterior Vitreous Vitreous syneresis Vitreous syneresis         Fundus Exam       Right Left   Disc Pink and Sharp Pink and Sharp, mild central Pallor, +cupping   C/D Ratio 0.6 0.7   Macula blunted foveal reflex, scattered MA, no edema, +druplets, RPE mottling and clumping Flat, blunted foveal reflex, Retinal pigment epithelial mottling and clumping, +drusen, No heme or edema   Vessels attenuated, Tortuous attenuated, Tortuous   Periphery Attached, bullous retinoschisis cavity inferiorly from 0600 to 0730 - mild posterior progression, inferior reticular degeneration, No RT/RD on 360 exam Attached, bullous retinoschisis cavity from 0430 to 0600 -- mild posterior progression, reticular degeneration, RPE mottling, No RT/RD           Refraction     Wearing Rx       Sphere Cylinder Axis Add   Right +0.75 +0.75 158 +1.75   Left +0.25 +0.50 032 +1.75    Type: PAL            IMAGING AND PROCEDURES  Imaging and Procedures for @TODAY @  OCT, Retina - OU - Both Eyes       Right Eye Quality was good. Central Foveal Thickness: 281. Progression  has been stable. Findings include normal foveal contour, no IRF, no SRF, retinal drusen (Inferotemporal retinoschisis caught on widefield OCT - stable).   Left Eye Quality was good. Central Foveal Thickness: 273. Progression has been stable. Findings include normal foveal contour, no IRF, no SRF, retinal drusen (Inferotemporal retinoschisis caught on widefield OCT).   Notes *Images captured and stored on drive  Diagnosis / Impression:  Inferotemporal retinoschisis OU -- caught on widefield OCT and stable from prior Retinal drusen OU   Clinical management:  See below  Abbreviations: NFP - Normal foveal profile. CME - cystoid macular edema. PED - pigment  epithelial detachment. IRF - intraretinal fluid. SRF - subretinal fluid. EZ - ellipsoid zone. ERM - epiretinal membrane. ORA - outer retinal atrophy. ORT - outer retinal tubulation. SRHM - subretinal hyper-reflective material       Repair Retinal Breaks, Laser - OD - Right Eye       LASER PROCEDURE NOTE  Procedure:  Barrier laser retinopexy using slit lamp laser, RIGHT eye   Diagnosis:   Retinoschisis, RIGHT eye                     Bullous retinoschisis, inferotemporal quadrant (0600-0800)  Surgeon: Rennis Chris, MD, PhD  Anesthesia: Topical  Informed consent obtained, operative eye marked, and time out performed prior to initiation of laser.   Laser settings:  Lumenis [id number] laser, slit lamp Lens: Mainster PRP 165 Power: 300 mW Spot size: 200 microns Duration: 30 msec  # spots: 709  Placement of laser: Using a Mainster PRP 165 contact lens at the slit lamp, laser was placed in three+ confluent rows posterior to retinoschisis cavity from 0600-0800 w/ anterior extension to ora.  Complications: None.  Patient tolerated the procedure well and received written and verbal post-procedure care information/education.             ASSESSMENT/PLAN:    ICD-10-CM   1. 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 Essential hypertension  I10     5. Hypertensive retinopathy of both eyes  H35.033     6. Combined forms of age-related cataract of both eyes  H25.813     7. Bilateral retinal defect  H33.303 Repair Retinal Breaks, Laser - OD - Right Eye      1. Retinoschisis OU-   - bullous, inferotemporal retinoschisis cavities OU (OD > OS)   - schisis confirmed by widefield OCT -- stable  - no retinal tears or holes on depressed exam  - fairly large cavity with significant posterior extension -- notable  in comparison to 02/2018 baseline images  - discussed findings, prognosis and possible need for treatment due to posterior progression  - recommend non-emergent laser retinopexy OU - retinopexy OD first today, 04.08.24 - pt wishes to proceed with laser - RBA of procedure discussed, questions answered - informed consent obtained and signed - see procedure note - start PF QID OD x7 days  - F/U April 22 for laser retinopexy OS, sooner prn  2. Retinal drusen OU  - mild OCT findings -- stable from prior  - monitor  3. Hx of refractile arteriolar plaque superiorly OD  - located at 1oclock midzone at arteriolar bifurcation -- not present  today ?resolved  - good distal perfusion   - monitor  4,5. Hypertensive retinopathy OU  - discussed importance of tight BP control  - monitor  6. Combined form age-related cataract OU-   - The symptoms of cataract, surgical options, and treatments and risks were discussed with patient.  - discussed diagnosis and progression  - under the expert management of Dr. Nile Riggs   Ophthalmic Meds Ordered this visit:  Meds ordered this encounter  Medications   prednisoLONE acetate (PRED FORTE) 1 % ophthalmic suspension    Sig: Place 1 drop into the right eye 4 (four) times daily for 7 days.    Dispense:  10 mL    Refill:  0     Return in 2 weeks (on 10/06/2022) for f/u retinoschisis OU, DFE, OCT, laser retinopexy OS.  There are no Patient Instructions on file for this visit.   Explained the diagnoses, plan, and follow up with the patient and they expressed understanding.  Patient expressed understanding of the importance of proper follow up care.    This document serves as a record of services personally performed by Karie Chimera, MD, PhD. It was created on their behalf by De Blanch, an ophthalmic technician. The creation of this record is the provider's dictation and/or activities during the visit.    Electronically signed by: De Blanch, OA, 09/22/22  4:36 PM  This document serves as a record of services personally performed by Karie Chimera, MD, PhD. It was created on their behalf by Glee Arvin. Manson Passey, OA an ophthalmic technician. The creation of this record is the provider's dictation and/or activities during the visit.    Electronically signed by: Glee Arvin. Manson Passey, [place] 04.08.2024 4:36 PM  Karie Chimera, M.D., Ph.D. Diseases & Surgery of the Retina and Vitreous [facility]  I have reviewed the above documentation for accuracy and completeness, and I agree with the above. Karie Chimera, M.D., Ph.D. 09/22/22 4:37 PM   Abbreviations: M myopia (nearsighted); A astigmatism; H hyperopia (farsighted); P presbyopia; Mrx spectacle prescription;  CTL contact lenses; OD right eye; OS left eye; OU both eyes  XT exotropia; ET esotropia; PEK punctate epithelial keratitis; PEE punctate epithelial erosions; DES dry eye syndrome; MGD meibomian gland dysfunction; ATs artificial tears; PFAT's preservative free artificial tears; NSC nuclear sclerotic cataract; PSC posterior subcapsular cataract; ERM epi-retinal membrane; PVD posterior vitreous detachment; RD retinal detachment; DM diabetes mellitus; DR diabetic retinopathy; NPDR non-proliferative diabetic retinopathy; PDR proliferative diabetic retinopathy; CSME clinically significant macular edema; DME diabetic macular edema; dbh dot blot hemorrhages; CWS cotton wool spot; POAG primary open angle glaucoma; C/D cup-to-disc ratio; HVF humphrey visual field; GVF goldmann visual field; OCT optical coherence tomography; IOP intraocular pressure; BRVO Branch retinal vein occlusion; CRVO central retinal vein occlusion; CRAO central retinal artery occlusion; BRAO branch retinal artery occlusion; RT retinal tear; SB scleral buckle; PPV pars plana vitrectomy; VH Vitreous hemorrhage; PRP panretinal laser photocoagulation; IVK intravitreal kenalog; VMT vitreomacular traction; MH  Macular hole;  NVD neovascularization of the disc; NVE neovascularization elsewhere; AREDS age related eye disease study; ARMD age related macular degeneration; POAG primary open angle glaucoma; EBMD epithelial/anterior basement membrane dystrophy; ACIOL anterior chamber intraocular lens; IOL intraocular lens; PCIOL posterior chamber intraocular lens; Phaco/IOL phacoemulsification with intraocular lens placement; PRK photorefractive keratectomy; LASIK laser assisted in situ keratomileusis; HTN hypertension; DM diabetes mellitus; COPD chronic obstructive pulmonary disease

## 2022-09-17 DIAGNOSIS — I5022 Chronic systolic (congestive) heart failure: Secondary | ICD-10-CM | POA: Diagnosis not present

## 2022-09-17 DIAGNOSIS — I4901 Ventricular fibrillation: Secondary | ICD-10-CM | POA: Diagnosis not present

## 2022-09-17 DIAGNOSIS — Z95810 Presence of automatic (implantable) cardiac defibrillator: Secondary | ICD-10-CM | POA: Diagnosis not present

## 2022-09-22 ENCOUNTER — Ambulatory Visit (INDEPENDENT_AMBULATORY_CARE_PROVIDER_SITE_OTHER): Payer: Medicare HMO | Admitting: Ophthalmology

## 2022-09-22 ENCOUNTER — Encounter (INDEPENDENT_AMBULATORY_CARE_PROVIDER_SITE_OTHER): Payer: Self-pay | Admitting: Ophthalmology

## 2022-09-22 DIAGNOSIS — H3509 Other intraretinal microvascular abnormalities: Secondary | ICD-10-CM

## 2022-09-22 DIAGNOSIS — H35363 Drusen (degenerative) of macula, bilateral: Secondary | ICD-10-CM | POA: Diagnosis not present

## 2022-09-22 DIAGNOSIS — I10 Essential (primary) hypertension: Secondary | ICD-10-CM

## 2022-09-22 DIAGNOSIS — H33303 Unspecified retinal break, bilateral: Secondary | ICD-10-CM

## 2022-09-22 DIAGNOSIS — H35033 Hypertensive retinopathy, bilateral: Secondary | ICD-10-CM

## 2022-09-22 DIAGNOSIS — H33103 Unspecified retinoschisis, bilateral: Secondary | ICD-10-CM

## 2022-09-22 DIAGNOSIS — H25813 Combined forms of age-related cataract, bilateral: Secondary | ICD-10-CM

## 2022-09-22 MED ORDER — PREDNISOLONE ACETATE 1 % OP SUSP: 1.0000 [drp] | Freq: Four times a day (QID) | OPHTHALMIC | 0 refills | Status: AC

## 2022-09-30 NOTE — Progress Notes (Signed)
[facility] - Clinic Note  10/06/2022     CHIEF COMPLAINT Patient presents for Retina Follow Up   HISTORY OF PRESENT ILLNESS: [name] is a 72 y.o. female who presents to the clinic today for:   HPI     Retina Follow Up   Patient presents with  Other.  In both eyes.  This started 2 weeks ago.  I, the attending physician,  performed the HPI with the patient and updated documentation appropriately.        Comments   Patient here for 2 weeks retina follow up for retinoschisis OU. Patient states vision no changes last couple of weeks . Can't see TV as good. No eye pain.       Last edited by Rennis Chris, MD on 10/06/2022  4:40 PM.    Pt here for laser retinopexy OS  Referring physician: Excell Seltzer, MD [address] [place],  [place] [address]  HISTORICAL INFORMATION:   Selected notes from the MEDICAL RECORD NUMBER Referred by Dr. Council Mechanic for retinoschisis OU LEE: 08.29.19 (K. Hallahan) [BCVA: OD: 20/20 OS: 20/20-] Ocular Hx-cataracts OU, glaucoma suspect OU PMH-anxiety, HTN, current smoker    CURRENT MEDICATIONS: No current outpatient medications on file. (Ophthalmic Drugs)   No current facility-administered medications for this visit. (Ophthalmic Drugs)   Current Outpatient Medications (Other)  Medication Sig   acetaminophen (TYLENOL) 500 MG tablet Take 1,000 mg by mouth 2 (two) times daily as needed (pain).   amoxicillin (AMOXIL) 500 MG capsule Take 2 capsules (1,000 mg total) by mouth 2 (two) times daily.   aspirin 81 MG chewable tablet Chew 1 tablet (81 mg total) by mouth daily.   carvedilol (COREG) 3.125 MG tablet TAKE 1 TABLET BY MOUTH TWICE A DAY WITH A MEAL   Cholecalciferol (VITAMIN D3 PO) Take 1,000 Units by mouth in the morning and at bedtime.   ELIQUIS 5 MG TABS tablet TAKE 1 TABLET BY MOUTH TWICE A DAY   ezetimibe (ZETIA) 10 MG tablet TAKE 1 TABLET BY MOUTH EVERY DAY   famotidine (PEPCID) 20 MG tablet TAKE 1  TABLET BY MOUTH TWICE A DAY (Patient taking differently: Take 20 mg by mouth daily.)   furosemide (LASIX) 20 MG tablet TAKE 1 TABLET BY MOUTH TWICE A DAY   Magnesium 400 MG TABS Take 400 mg by mouth 2 (two) times daily.    rosuvastatin (CRESTOR) 10 MG tablet TAKE 1 TABLET (10 MG TOTAL) BY MOUTH DAILY AT 6 PM.   sertraline (ZOLOFT) 50 MG tablet TAKE 1 TABLET BY MOUTH EVERYDAY AT BEDTIME   vitamin B-12 (CYANOCOBALAMIN) 1000 MCG tablet Take 1,000 mcg by mouth daily.   No current facility-administered medications for this visit. (Other)   REVIEW OF SYSTEMS: ROS   Positive for: Gastrointestinal, Neurological, Musculoskeletal, Cardiovascular, Eyes Negative for: Constitutional, Skin, Genitourinary, HENT, Endocrine, Respiratory, Psychiatric, Allergic/Imm, Heme/Lymph Last edited by Laddie Aquas, COA on 10/06/2022  2:38 PM.     ALLERGIES Allergies  Allergen Reactions   Shellfish-Derived Products Anaphylaxis   Ativan [Lorazepam] Other (See Comments)    Makes "skin crawl" and insomnia   Buspar [Buspirone] Nausea Only    Sweating and dizzy   Clarithromycin Swelling   Codeine Nausea And Vomiting   Doxycycline Other (See Comments)    Unknown   Guaifenesin Other (See Comments)    Palpitations   Oxycodone Nausea And Vomiting   Prednisone Nausea And Vomiting and Other (See Comments)  Makes my heart race   Statins Other (See Comments)    Muscle pain   Sulfonamide Derivatives Nausea And Vomiting    Achiness   Tetracycline Nausea Only   Vicodin [Hydrocodone-Acetaminophen] Nausea And Vomiting   Famotidine Rash   Latex Rash   Omeprazole Nausea Only    Cough, shortness of breath - patient doesn't remember   Peanut-Containing Drug Products Itching and Rash   Protonix [Pantoprazole] Rash   Wellbutrin [Bupropion] Palpitations   PAST MEDICAL HISTORY Past Medical History:  Diagnosis Date   Allergy    Anxiety    Arnold-Chiari malformation    Arthritis    CHF (congestive heart failure)     Chronic systolic heart failure 11/03/2019   Coronary artery disease    Dyspnea    Encounter for assessment of implantable cardioverter-defibrillator (ICD) 11/10/2019   GERD (gastroesophageal reflux disease)    H. pylori infection    3 years ago   H/O cardiac arrest    Hx of VT/Vfib arrest   Hyperlipidemia    ICD BiV Medtronic model [id number] Claria Quad CRT-D SureScan ICD 08/25/2019    Incontinence    Paroxysmal atrial fibrillation 08/06/2019   Pulmonary edema 2021   Syncope and collapse    Per pt, denies passing out   Tobacco use disorder    Unspecified essential hypertension    Past Surgical History:  Procedure Laterality Date   APPENDECTOMY     ARNOLD CHIARI SURGERY     neurocranial surgery   BIV ICD INSERTION CRT-D N/A 08/25/2019   Procedure: BIV ICD INSERTION CRT-D;  Surgeon: Regan Lemming, MD;  Location: [facility] INVASIVE CV LAB;  Service: Cardiovascular;  Laterality: N/A;   BLEPHAROPLASTY     Bil   C-EYE SURGERY PROCEDURE     CARDIAC CATHETERIZATION     CHOLECYSTECTOMY     EYE SURGERY     heart failure     LEFT HEART CATH AND CORONARY ANGIOGRAPHY N/A 08/04/2019   Procedure: LEFT HEART CATH AND CORONARY ANGIOGRAPHY;  Surgeon: Yates Decamp, MD;  Location: [facility] INVASIVE CV LAB;  Service: Cardiovascular;  Laterality: N/A;   MASS EXCISION Right 12/27/2013   Procedure: MINOR EXCISION OF RIGHT THUMB MUCOID CYST, DEBRIDEMENT OF INTERPHALANGEAL JOINT;  Surgeon: Wyn Forster, MD;  Location: [facility];  Service: Orthopedics;  Laterality: Right;   mass on thumb  right   TOTAL KNEE ARTHROPLASTY Right 02/17/2017   TOTAL KNEE ARTHROPLASTY Right 02/17/2017   Procedure: TOTAL KNEE ARTHROPLASTY;  Surgeon: Marcene Corning, MD;  Location: [facility] OR;  Service: Orthopedics;  Laterality: Right;   TUBAL LIGATION     FAMILY HISTORY Family History  Problem Relation Age of Onset   Bone cancer Mother    Heart disease Mother    Cancer Sister        metastatic; unknown primary    Diverticulosis Sister    Tuberculosis Father    Hypertension Sister    Colon cancer Neg Hx    Esophageal cancer Neg Hx    Pancreatic cancer Neg Hx    Stomach cancer Neg Hx    Liver disease Neg Hx    Kidney disease Neg Hx    Rectal cancer Neg Hx    Pancreatic disease Neg Hx    SOCIAL HISTORY Social History   Tobacco Use   Smoking status: Former    Packs/day: 1.00    Years: 50.00    Additional pack years: 0.00    Total pack years: 50.00  Types: Cigarettes    Quit date: 08/08/2019    Years since quitting: 3.1   Smokeless tobacco: Never  Vaping Use   Vaping Use: Never used  Substance Use Topics   Alcohol use: No    Alcohol/week: 0.0 standard drinks of alcohol   Drug use: No       OPHTHALMIC EXAM:  Base Eye Exam     Visual Acuity (Snellen - Linear)       Right Left   Dist cc 20/30 +1 20/40   Dist ph cc 20/25 -1 20/20 -1    Correction: Glasses         Tonometry (Tonopen, 2:35 PM)       Right Left   Pressure 20 16         Pupils       Dark Light Shape React APD   Right 4 3 Round Brisk None   Left 4 3 Round Brisk None         Visual Fields (Counting fingers)       Left Right    Full Full         Extraocular Movement       Right Left    Full, Ortho Full, Ortho         Neuro/Psych     Oriented x3: Yes   Mood/Affect: Normal         Dilation     Both eyes: 1.0% Mydriacyl, 2.5% Phenylephrine @ 2:35 PM           Slit Lamp and Fundus Exam     Slit Lamp Exam       Right Left   Lids/Lashes Dermatochalasis - upper lid Dermatochalasis - upper lid, Meibomian gland dysfunction   Conjunctiva/Sclera Nasal and Temporal Pinguecula Nasal and Temporal Pinguecula   Cornea Arcus, trace Punctate epithelial erosions Arcus, trace PEE   Anterior Chamber deep, clear, narrow temporal angle deep, clear, narrow temporal angle   Iris Round and dilated Round and dilated   Lens 3+ Nuclear sclerosis with brunescence, 3+ Cortical cataract 3+ Nuclear  sclerosis with brunescence, 3+ Cortical cataract   Anterior Vitreous Vitreous syneresis Vitreous syneresis         Fundus Exam       Right Left   Disc Pink and Sharp Pink and Sharp, mild central Pallor, +cupping   C/D Ratio 0.6 0.7   Macula blunted foveal reflex, scattered MA, no edema, +druplets, RPE mottling and clumping Flat, blunted foveal reflex, Retinal pigment epithelial mottling and clumping, +drusen, No heme or edema   Vessels attenuated, Tortuous attenuated, Tortuous   Periphery Attached, bullous retinoschisis cavity inferiorly from 0600 to 0730 - mild posterior progression, inferior reticular degeneration, No RT/RD on 360 exam Attached, bullous retinoschisis cavity from 0430 to 0600 -- mild posterior progression, reticular degeneration, RPE mottling, No RT/RD           Refraction     Wearing Rx       Sphere Cylinder Axis Add   Right +0.75 +0.75 158 +1.75   Left +0.25 +0.50 032 +1.75    Type: PAL            IMAGING AND PROCEDURES  Imaging and Procedures for @  OCT, Retina - OU - Both Eyes       Right Eye Quality was good. Central Foveal Thickness: 287. Progression has been stable. Findings include normal foveal contour, no IRF, no SRF, retinal drusen (Inferotemporal retinoschisis caught on widefield OCT - stable).  Left Eye Quality was good. Central Foveal Thickness: 267. Progression has been stable. Findings include normal foveal contour, no IRF, no SRF, retinal drusen (Inferotemporal retinoschisis caught on widefield OCT -- not imaged today).   Notes *Images captured and stored on drive  Diagnosis / Impression:  Inferotemporal retinoschisis OU -- caught on widefield OCT and stable from prior Retinal drusen OU   Clinical management:  See below  Abbreviations: NFP - Normal foveal profile. CME - cystoid macular edema. PED - pigment epithelial detachment. IRF - intraretinal fluid. SRF - subretinal fluid. EZ - ellipsoid zone. ERM - epiretinal  membrane. ORA - outer retinal atrophy. ORT - outer retinal tubulation. SRHM - subretinal hyper-reflective material       Repair Retinal Breaks, Laser - OS - Left Eye       LASER PROCEDURE NOTE  Procedure:  Barrier laser retinopexy using slit lamp laser, LEFT eye   Diagnosis:   Retinal defect, LEFT eye                     Bullous peripheral retinoschisis from 0430 to 0600 o'clock with posterior extension almost to IT arcades  Surgeon: Rennis Chris, MD, PhD  Anesthesia: Topical  Informed consent obtained, operative eye marked, and time out performed prior to initiation of laser.   Laser settings:  Lumenis [id number] laser, slit lamp Lens: Mainster PRP 165 Power: 320 mW Spot size: 200 microns Duration: 30 msec  # spots: [id number]  Placement of laser: Using a Mainster PRP 165 contact lens at the slit lamp, laser was placed in three+ confluent rows posterior to bullous retinoschisis IT periphery spanning 0430-0600 w/ posterior extension almost to IT arcades.  Complications: None.  Notes: Difficult view at 0430 periphery  Patient tolerated the procedure well and received written and verbal post-procedure care information/education.            ASSESSMENT/PLAN:    ICD-10-CM   1. 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 Essential hypertension  I10     5. Hypertensive retinopathy of both eyes  H35.033     6. Combined forms of age-related cataract of both eyes  H25.813     7. Bilateral retinal defect  H33.303 Repair Retinal Breaks, Laser - OS - Left Eye     1. Retinoschisis OU-   - bullous, inferotemporal retinoschisis cavities OU (OD > OS)   - schisis confirmed by widefield OCT -- stable  - no retinal tears or holes on depressed exam  - fairly large cavity with significant posterior extension --  notable in comparison to 02/2018 baseline images  - discussed findings, prognosis and possible need for treatment due to posterior progression  - s/p retinopexy OD (04.08.24)  - recommend laser retinopexy OS today, 04.22.24 - pt wishes to proceed with laser - RBA of procedure discussed, questions answered - informed consent obtained and signed - see procedure note - start PF QID OS x7 days  - F/U 2-3 weeks, sooner prn  2. Retinal drusen OU  - mild OCT findings -- stable from prior  - monitor  3. Hx of refractile arteriolar plaque superiorly OD  - located at 1oclock midzone at arteriolar bifurcation -- not present today ?resolved  -  good distal perfusion   - monitor  4,5. Hypertensive retinopathy OU  - discussed importance of tight BP control  - monitor  6. Combined form age-related cataract OU-   - The symptoms of cataract, surgical options, and treatments and risks were discussed with patient.  - discussed diagnosis and progression  - under the expert management of Dr. Nile Riggs   Ophthalmic Meds Ordered this visit:  No orders of the defined types were placed in this encounter.    No follow-ups on file.  There are no Patient Instructions on file for this visit.   Explained the diagnoses, plan, and follow up with the patient and they expressed understanding.  Patient expressed understanding of the importance of proper follow up care.    This document serves as a record of services personally performed by Karie Chimera, MD, PhD. It was created on their behalf by De Blanch, an ophthalmic technician. The creation of this record is the provider's dictation and/or activities during the visit.    Electronically signed by: De Blanch, OA, 10/06/22  4:40 PM  This document serves as a record of services personally performed by Karie Chimera, MD, PhD. It was created on their behalf by Glee Arvin. Manson Passey, OA an ophthalmic technician. The creation of this record is the  provider's dictation and/or activities during the visit.    Electronically signed by: Glee Arvin. Manson Passey, [place] 04.22.2024 4:40 PM   Karie Chimera, M.D., Ph.D. Diseases & Surgery of the Retina and Vitreous [facility]  I have reviewed the above documentation for accuracy and completeness, and I agree with the above. Karie Chimera, M.D., Ph.D. 10/06/22 4:41 PM   Abbreviations: M myopia (nearsighted); A astigmatism; H hyperopia (farsighted); P presbyopia; Mrx spectacle prescription;  CTL contact lenses; OD right eye; OS left eye; OU both eyes  XT exotropia; ET esotropia; PEK punctate epithelial keratitis; PEE punctate epithelial erosions; DES dry eye syndrome; MGD meibomian gland dysfunction; ATs artificial tears; PFAT's preservative free artificial tears; NSC nuclear sclerotic cataract; PSC posterior subcapsular cataract; ERM epi-retinal membrane; PVD posterior vitreous detachment; RD retinal detachment; DM diabetes mellitus; DR diabetic retinopathy; NPDR non-proliferative diabetic retinopathy; PDR proliferative diabetic retinopathy; CSME clinically significant macular edema; DME diabetic macular edema; dbh dot blot hemorrhages; CWS cotton wool spot; POAG primary open angle glaucoma; C/D cup-to-disc ratio; HVF humphrey visual field; GVF goldmann visual field; OCT optical coherence tomography; IOP intraocular pressure; BRVO Branch retinal vein occlusion; CRVO central retinal vein occlusion; CRAO central retinal artery occlusion; BRAO branch retinal artery occlusion; RT retinal tear; SB scleral buckle; PPV pars plana vitrectomy; VH Vitreous hemorrhage; PRP panretinal laser photocoagulation; IVK intravitreal kenalog; VMT vitreomacular traction; MH Macular hole;  NVD neovascularization of the disc; NVE neovascularization elsewhere; AREDS age related eye disease study; ARMD age related macular degeneration; POAG primary open angle glaucoma; EBMD epithelial/anterior basement membrane  dystrophy; ACIOL anterior chamber intraocular lens; IOL intraocular lens; PCIOL posterior chamber intraocular lens; Phaco/IOL phacoemulsification with intraocular lens placement; PRK photorefractive keratectomy; LASIK laser assisted in situ keratomileusis; HTN hypertension; DM diabetes mellitus; COPD chronic obstructive pulmonary disease

## 2022-10-03 ENCOUNTER — Other Ambulatory Visit: Payer: Self-pay | Admitting: Cardiology

## 2022-10-03 DIAGNOSIS — I5022 Chronic systolic (congestive) heart failure: Secondary | ICD-10-CM | POA: Diagnosis not present

## 2022-10-03 DIAGNOSIS — Z95810 Presence of automatic (implantable) cardiac defibrillator: Secondary | ICD-10-CM | POA: Diagnosis not present

## 2022-10-03 DIAGNOSIS — I4901 Ventricular fibrillation: Secondary | ICD-10-CM | POA: Diagnosis not present

## 2022-10-03 DIAGNOSIS — Z4502 Encounter for adjustment and management of automatic implantable cardiac defibrillator: Secondary | ICD-10-CM | POA: Diagnosis not present

## 2022-10-06 ENCOUNTER — Encounter (INDEPENDENT_AMBULATORY_CARE_PROVIDER_SITE_OTHER): Payer: Self-pay | Admitting: Ophthalmology

## 2022-10-06 ENCOUNTER — Ambulatory Visit (INDEPENDENT_AMBULATORY_CARE_PROVIDER_SITE_OTHER): Payer: Medicare HMO | Admitting: Ophthalmology

## 2022-10-06 DIAGNOSIS — H35363 Drusen (degenerative) of macula, bilateral: Secondary | ICD-10-CM

## 2022-10-06 DIAGNOSIS — H33103 Unspecified retinoschisis, bilateral: Secondary | ICD-10-CM

## 2022-10-06 DIAGNOSIS — I10 Essential (primary) hypertension: Secondary | ICD-10-CM

## 2022-10-06 DIAGNOSIS — H35033 Hypertensive retinopathy, bilateral: Secondary | ICD-10-CM

## 2022-10-06 DIAGNOSIS — H25813 Combined forms of age-related cataract, bilateral: Secondary | ICD-10-CM

## 2022-10-06 DIAGNOSIS — H33303 Unspecified retinal break, bilateral: Secondary | ICD-10-CM

## 2022-10-06 DIAGNOSIS — H3509 Other intraretinal microvascular abnormalities: Secondary | ICD-10-CM

## 2022-10-09 ENCOUNTER — Other Ambulatory Visit: Payer: Self-pay | Admitting: Cardiology

## 2022-10-14 NOTE — Progress Notes (Signed)
[facility] - Clinic Note  10/20/2022     CHIEF COMPLAINT Patient presents for Retina Follow Up   HISTORY OF PRESENT ILLNESS: [name] is a 72 y.o. female who presents to the clinic today for:   HPI     Retina Follow Up   Patient presents with  Other.  In left eye.  This started 2 weeks ago.  Duration of 2 weeks.  Since onset it is stable.  I, the attending physician,  performed the HPI with the patient and updated documentation appropriately.        Comments   2 week retina follow up schisis OU s/p pexy os pt is reporting vision seems little better she denies any flashes or floaters       Last edited by Rennis Chris, MD on 10/22/2022 11:42 PM.    Pt states she had no problems with laser at last visit, Dr. Nile Riggs is retiring June 1st, she would like a new recommendation for a cataract surgeon  Referring physician: Excell Seltzer, MD [address] [place],  [place] [address]  HISTORICAL INFORMATION:   Selected notes from the MEDICAL RECORD NUMBER Referred by Dr. Council Mechanic for retinoschisis OU LEE: 08.29.19 (K. Hallahan) [BCVA: OD: 20/20 OS: 20/20-] Ocular Hx-cataracts OU, glaucoma suspect OU PMH-anxiety, HTN, current smoker    CURRENT MEDICATIONS: No current outpatient medications on file. (Ophthalmic Drugs)   No current facility-administered medications for this visit. (Ophthalmic Drugs)   Current Outpatient Medications (Other)  Medication Sig   acetaminophen (TYLENOL) 500 MG tablet Take 1,000 mg by mouth 2 (two) times daily as needed (pain).   amoxicillin (AMOXIL) 500 MG capsule Take 2 capsules (1,000 mg total) by mouth 2 (two) times daily.   aspirin 81 MG chewable tablet Chew 1 tablet (81 mg total) by mouth daily.   carvedilol (COREG) 3.125 MG tablet TAKE 1 TABLET BY MOUTH TWICE A DAY WITH A MEAL   Cholecalciferol (VITAMIN D3 PO) Take 1,000 Units by mouth in the morning and at bedtime.   ELIQUIS 5 MG TABS tablet TAKE 1 TABLET  BY MOUTH TWICE A DAY   ezetimibe (ZETIA) 10 MG tablet TAKE 1 TABLET BY MOUTH EVERY DAY   famotidine (PEPCID) 20 MG tablet TAKE 1 TABLET BY MOUTH TWICE A DAY (Patient taking differently: Take 20 mg by mouth daily.)   furosemide (LASIX) 20 MG tablet TAKE 1 TABLET BY MOUTH TWICE A DAY   Magnesium 400 MG TABS Take 400 mg by mouth 2 (two) times daily.    rosuvastatin (CRESTOR) 10 MG tablet TAKE 1 TABLET (10 MG TOTAL) BY MOUTH DAILY AT 6 PM.   sertraline (ZOLOFT) 50 MG tablet TAKE 1 TABLET BY MOUTH EVERYDAY AT BEDTIME   vitamin B-12 (CYANOCOBALAMIN) 1000 MCG tablet Take 1,000 mcg by mouth daily.   No current facility-administered medications for this visit. (Other)   REVIEW OF SYSTEMS: ROS   Positive for: Gastrointestinal, Neurological, Musculoskeletal, Cardiovascular, Eyes Negative for: Constitutional, Skin, Genitourinary, HENT, Endocrine, Respiratory, Psychiatric, Allergic/Imm, Heme/Lymph Last edited by Etheleen Mayhew, COT on 10/20/2022  2:29 PM.     ALLERGIES Allergies  Allergen Reactions   Shellfish-Derived Products Anaphylaxis   Ativan [Lorazepam] Other (See Comments)    Makes "skin crawl" and insomnia   Buspar [Buspirone] Nausea Only    Sweating and dizzy   Clarithromycin Swelling   Codeine Nausea And Vomiting   Doxycycline Other (See Comments)    Unknown   Guaifenesin Other (  See Comments)    Palpitations   Oxycodone Nausea And Vomiting   Prednisone Nausea And Vomiting and Other (See Comments)    Makes my heart race   Statins Other (See Comments)    Muscle pain   Sulfonamide Derivatives Nausea And Vomiting    Achiness   Tetracycline Nausea Only   Vicodin [Hydrocodone-Acetaminophen] Nausea And Vomiting   Famotidine Rash   Latex Rash   Omeprazole Nausea Only    Cough, shortness of breath - patient doesn't remember   Peanut-Containing Drug Products Itching and Rash   Protonix [Pantoprazole] Rash   Wellbutrin [Bupropion] Palpitations   PAST MEDICAL HISTORY Past  Medical History:  Diagnosis Date   Allergy    Anxiety    Arnold-Chiari malformation (HCC)    Arthritis    CHF (congestive heart failure) (HCC)    Chronic systolic heart failure (HCC) 11/03/2019   Coronary artery disease    Dyspnea    Encounter for assessment of implantable cardioverter-defibrillator (ICD) 11/10/2019   GERD (gastroesophageal reflux disease)    H. pylori infection    3 years ago   H/O cardiac arrest    Hx of VT/Vfib arrest   Hyperlipidemia    ICD BiV Medtronic model [id number] Claria Quad CRT-D SureScan ICD 08/25/2019    Incontinence    Paroxysmal atrial fibrillation (HCC) 08/06/2019   Pulmonary edema 2021   Syncope and collapse    Per pt, denies passing out   Tobacco use disorder    Unspecified essential hypertension    Past Surgical History:  Procedure Laterality Date   APPENDECTOMY     ARNOLD CHIARI SURGERY     neurocranial surgery   BIV ICD INSERTION CRT-D N/A 08/25/2019   Procedure: BIV ICD INSERTION CRT-D;  Surgeon: Regan Lemming, MD;  Location: [facility] INVASIVE CV LAB;  Service: Cardiovascular;  Laterality: N/A;   BLEPHAROPLASTY     Bil   C-EYE SURGERY PROCEDURE     CARDIAC CATHETERIZATION     CHOLECYSTECTOMY     EYE SURGERY     heart failure     LEFT HEART CATH AND CORONARY ANGIOGRAPHY N/A 08/04/2019   Procedure: LEFT HEART CATH AND CORONARY ANGIOGRAPHY;  Surgeon: Yates Decamp, MD;  Location: [facility] INVASIVE CV LAB;  Service: Cardiovascular;  Laterality: N/A;   MASS EXCISION Right 12/27/2013   Procedure: MINOR EXCISION OF RIGHT THUMB MUCOID CYST, DEBRIDEMENT OF INTERPHALANGEAL JOINT;  Surgeon: Wyn Forster, MD;  Location: [facility];  Service: Orthopedics;  Laterality: Right;   mass on thumb  right   TOTAL KNEE ARTHROPLASTY Right 02/17/2017   TOTAL KNEE ARTHROPLASTY Right 02/17/2017   Procedure: TOTAL KNEE ARTHROPLASTY;  Surgeon: Marcene Corning, MD;  Location: [facility] OR;  Service: Orthopedics;  Laterality: Right;   TUBAL LIGATION     FAMILY  HISTORY Family History  Problem Relation Age of Onset   Bone cancer Mother    Heart disease Mother    Cancer Sister        metastatic; unknown primary   Diverticulosis Sister    Tuberculosis Father    Hypertension Sister    Colon cancer Neg Hx    Esophageal cancer Neg Hx    Pancreatic cancer Neg Hx    Stomach cancer Neg Hx    Liver disease Neg Hx    Kidney disease Neg Hx    Rectal cancer Neg Hx    Pancreatic disease Neg Hx    SOCIAL HISTORY Social History   Tobacco Use  Smoking status: Former    Packs/day: 1.00    Years: 50.00    Additional pack years: 0.00    Total pack years: 50.00    Types: Cigarettes    Quit date: 08/08/2019    Years since quitting: 3.2   Smokeless tobacco: Never  Vaping Use   Vaping Use: Never used  Substance Use Topics   Alcohol use: No    Alcohol/week: 0.0 standard drinks of alcohol   Drug use: No       OPHTHALMIC EXAM:  Base Eye Exam     Visual Acuity (Snellen - Linear)       Right Left   Dist cc 20/40 -2 20/30 -3   Dist ph cc 20/30 -1 NI    Correction: Glasses         Tonometry (Tonopen, 2:35 PM)       Right Left   Pressure 18 15         Pupils       Pupils Dark Light Shape React APD   Right PERRL 4 3 Round Brisk None   Left PERRL 4 3 Round Brisk None         Visual Fields       Left Right    Full Full         Extraocular Movement       Right Left    Full, Ortho Full, Ortho         Neuro/Psych     Oriented x3: Yes   Mood/Affect: Normal         Dilation     Both eyes:            Slit Lamp and Fundus Exam     Slit Lamp Exam       Right Left   Lids/Lashes Dermatochalasis - upper lid Dermatochalasis - upper lid, Meibomian gland dysfunction   Conjunctiva/Sclera Nasal and Temporal Pinguecula Nasal and Temporal Pinguecula   Cornea Arcus, trace Punctate epithelial erosions Arcus, trace PEE   Anterior Chamber deep, clear, narrow temporal angle deep, clear, narrow temporal angle   Iris  Round and dilated Round and dilated   Lens 3+ Nuclear sclerosis with brunescence, 3+ Cortical cataract 3+ Nuclear sclerosis with brunescence, 3+ Cortical cataract   Anterior Vitreous Vitreous syneresis Vitreous syneresis         Fundus Exam       Right Left   Disc Pink and Sharp Pink and Sharp, mild central Pallor, +cupping   C/D Ratio 0.6 0.7   Macula blunted foveal reflex, scattered MA, no edema, +druplets, RPE mottling and clumping Flat, blunted foveal reflex, Retinal pigment epithelial mottling and clumping, +drusen, No heme or edema   Vessels attenuated, Tortuous attenuated, Tortuous   Periphery Attached, bullous retinoschisis cavity inferiorly from 0600 to 0730 - good laser surrounding, inferior reticular degeneration, No RT/RD on 360 exam Attached, bullous retinoschisis cavity from 0430 to 0600 -- good laser surrounding, reticular degeneration, RPE mottling, No new RT/RD           Refraction     Wearing Rx       Sphere Cylinder Axis Add   Right +0.75 +0.75 158 +1.75   Left +0.25 +0.50 032 +1.75    Type: PAL            IMAGING AND PROCEDURES  Imaging and Procedures for @TODAY @  OCT, Retina - OU - Both Eyes       Right Eye Quality was good. Central  Foveal Thickness: 286. Progression has been stable. Findings include normal foveal contour, no IRF, no SRF, retinal drusen (Inferotemporal retinoschisis with surrounding ORA (laser) caught on widefield OCT - stable).   Left Eye Quality was good. Central Foveal Thickness: 277. Progression has been stable. Findings include normal foveal contour, no IRF, no SRF, retinal drusen (Inferotemporal retinoschisis caught on widefield OCT -- not imaged today).   Notes *Images captured and stored on drive  Diagnosis / Impression:  Inferotemporal retinoschisis OU -- caught on widefield OCT and stable from prior Retinal drusen OU   Clinical management:  See below  Abbreviations: NFP - Normal foveal profile. CME - cystoid  macular edema. PED - pigment epithelial detachment. IRF - intraretinal fluid. SRF - subretinal fluid. EZ - ellipsoid zone. ERM - epiretinal membrane. ORA - outer retinal atrophy. ORT - outer retinal tubulation. 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 Essential hypertension  I10     5. Hypertensive retinopathy of both eyes  H35.033     6. Combined forms of age-related cataract of both eyes  H25.813     7. Bilateral retinal defect  H33.303      1. Retinoschisis OU-   - bullous, inferotemporal retinoschisis cavities OU (OD > OS)   - schisis confirmed by widefield OCT -- stable  - no retinal tears or holes on depressed exam  - fairly large cavity with significant posterior extension -- notable in comparison to 02/2018 baseline images  - s/p laser retinopexy OD (04.08.24) -- good laser surrounding - s/p laser retinopexy OS (04.22.24) -- good laser surrounding  - F/U 3 months, sooner prn  2. Retinal drusen OU  - mild OCT findings -- stable from prior  - monitor  3. Hx of refractile arteriolar plaque superiorly OD  - located at 1 o'clock midzone at arteriolar bifurcation -- not present today ?resolved  - good distal perfusion   - monitor  4,5. Hypertensive retinopathy OU  - discussed importance of tight BP control  - monitor  6. Combined form age-related cataract OU-   - The symptoms of cataract, surgical options, and treatments and risks were discussed with patient.  - discussed diagnosis and progression  - under the expert management of Dr. Nile Riggs   Ophthalmic Meds Ordered this visit:  No orders of the defined types were placed in this encounter.    Return for f/u 3-4 months, retinoschisis OU, DFE, OCT.  There are no Patient Instructions on file for  this visit.   Explained the diagnoses, plan, and follow up with the patient and they expressed understanding.  Patient expressed understanding of the importance of proper follow up care.    This document serves as a record of services personally performed by Karie Chimera, MD, PhD. It was created on their behalf by De Blanch, an ophthalmic technician. The creation of this record is the provider's dictation and/or activities during the visit.    Electronically signed by: De Blanch, OA, 10/22/22  11:53 PM   This document serves as a record of services personally performed by Karie Chimera, MD, PhD. It was created on their behalf by Glee Arvin.  Manson Passey, OA an ophthalmic technician. The creation of this record is the provider's dictation and/or activities during the visit.    Electronically signed by: Glee Arvin. Manson Passey, [place] 05.06.2024 11:53 PM   Karie Chimera, M.D., Ph.D. Diseases & Surgery of the Retina and Vitreous [facility]  I have reviewed the above documentation for accuracy and completeness, and I agree with the above. Karie Chimera, M.D., Ph.D. 10/22/22 11:54 PM  Abbreviations: M myopia (nearsighted); A astigmatism; H hyperopia (farsighted); P presbyopia; Mrx spectacle prescription;  CTL contact lenses; OD right eye; OS left eye; OU both eyes  XT exotropia; ET esotropia; PEK punctate epithelial keratitis; PEE punctate epithelial erosions; DES dry eye syndrome; MGD meibomian gland dysfunction; ATs artificial tears; PFAT's preservative free artificial tears; NSC nuclear sclerotic cataract; PSC posterior subcapsular cataract; ERM epi-retinal membrane; PVD posterior vitreous detachment; RD retinal detachment; DM diabetes mellitus; DR diabetic retinopathy; NPDR non-proliferative diabetic retinopathy; PDR proliferative diabetic retinopathy; CSME clinically significant macular edema; DME diabetic macular edema; dbh dot blot hemorrhages; CWS cotton wool spot;  POAG primary open angle glaucoma; C/D cup-to-disc ratio; HVF humphrey visual field; GVF goldmann visual field; OCT optical coherence tomography; IOP intraocular pressure; BRVO Branch retinal vein occlusion; CRVO central retinal vein occlusion; CRAO central retinal artery occlusion; BRAO branch retinal artery occlusion; RT retinal tear; SB scleral buckle; PPV pars plana vitrectomy; VH Vitreous hemorrhage; PRP panretinal laser photocoagulation; IVK intravitreal kenalog; VMT vitreomacular traction; MH Macular hole;  NVD neovascularization of the disc; NVE neovascularization elsewhere; AREDS age related eye disease study; ARMD age related macular degeneration; POAG primary open angle glaucoma; EBMD epithelial/anterior basement membrane dystrophy; ACIOL anterior chamber intraocular lens; IOL intraocular lens; PCIOL posterior chamber intraocular lens; Phaco/IOL phacoemulsification with intraocular lens placement; PRK photorefractive keratectomy; LASIK laser assisted in situ keratomileusis; HTN hypertension; DM diabetes mellitus; COPD chronic obstructive pulmonary disease

## 2022-10-18 DIAGNOSIS — I5022 Chronic systolic (congestive) heart failure: Secondary | ICD-10-CM | POA: Diagnosis not present

## 2022-10-18 DIAGNOSIS — Z95810 Presence of automatic (implantable) cardiac defibrillator: Secondary | ICD-10-CM | POA: Diagnosis not present

## 2022-10-18 DIAGNOSIS — I4901 Ventricular fibrillation: Secondary | ICD-10-CM | POA: Diagnosis not present

## 2022-10-18 DIAGNOSIS — Z4509 Encounter for adjustment and management of other cardiac device: Secondary | ICD-10-CM | POA: Diagnosis not present

## 2022-10-20 ENCOUNTER — Encounter (INDEPENDENT_AMBULATORY_CARE_PROVIDER_SITE_OTHER): Payer: Self-pay | Admitting: Ophthalmology

## 2022-10-20 ENCOUNTER — Ambulatory Visit (INDEPENDENT_AMBULATORY_CARE_PROVIDER_SITE_OTHER): Payer: Medicare HMO | Admitting: Ophthalmology

## 2022-10-20 DIAGNOSIS — H33103 Unspecified retinoschisis, bilateral: Secondary | ICD-10-CM

## 2022-10-20 DIAGNOSIS — H25813 Combined forms of age-related cataract, bilateral: Secondary | ICD-10-CM | POA: Diagnosis not present

## 2022-10-20 DIAGNOSIS — H3509 Other intraretinal microvascular abnormalities: Secondary | ICD-10-CM

## 2022-10-20 DIAGNOSIS — H35033 Hypertensive retinopathy, bilateral: Secondary | ICD-10-CM | POA: Diagnosis not present

## 2022-10-20 DIAGNOSIS — H33303 Unspecified retinal break, bilateral: Secondary | ICD-10-CM

## 2022-10-20 DIAGNOSIS — H35363 Drusen (degenerative) of macula, bilateral: Secondary | ICD-10-CM | POA: Diagnosis not present

## 2022-10-20 DIAGNOSIS — I10 Essential (primary) hypertension: Secondary | ICD-10-CM

## 2022-10-22 ENCOUNTER — Encounter (INDEPENDENT_AMBULATORY_CARE_PROVIDER_SITE_OTHER): Payer: Self-pay | Admitting: Ophthalmology

## 2022-10-28 ENCOUNTER — Telehealth: Payer: Self-pay | Admitting: Family Medicine

## 2022-10-28 NOTE — Telephone Encounter (Signed)
Contacted [name] to schedule their annual wellness visit. Appointment made for 11/19/2022.  [facility] Care Guide [facility] AWV TEAM Direct Dial: [phone]

## 2022-11-01 ENCOUNTER — Other Ambulatory Visit: Payer: Self-pay | Admitting: Gastroenterology

## 2022-11-11 ENCOUNTER — Other Ambulatory Visit: Payer: Self-pay | Admitting: Cardiology

## 2022-11-18 DIAGNOSIS — Z4502 Encounter for adjustment and management of automatic implantable cardiac defibrillator: Secondary | ICD-10-CM | POA: Diagnosis not present

## 2022-11-18 DIAGNOSIS — I4901 Ventricular fibrillation: Secondary | ICD-10-CM | POA: Diagnosis not present

## 2022-11-18 DIAGNOSIS — I5022 Chronic systolic (congestive) heart failure: Secondary | ICD-10-CM | POA: Diagnosis not present

## 2022-11-18 DIAGNOSIS — Z95810 Presence of automatic (implantable) cardiac defibrillator: Secondary | ICD-10-CM | POA: Diagnosis not present

## 2022-11-21 ENCOUNTER — Ambulatory Visit (INDEPENDENT_AMBULATORY_CARE_PROVIDER_SITE_OTHER): Payer: Medicare HMO | Admitting: Family Medicine

## 2022-11-21 ENCOUNTER — Encounter: Payer: Self-pay | Admitting: Family Medicine

## 2022-11-21 VITALS — BP 114/68 | HR 88 | Temp 98.0°F | Resp 16 | Ht 64.0 in | Wt 163.0 lb

## 2022-11-21 DIAGNOSIS — R300 Dysuria: Secondary | ICD-10-CM

## 2022-11-21 LAB — POC URINALSYSI DIPSTICK (AUTOMATED)
Bilirubin, UA: NEGATIVE
Blood, UA: NEGATIVE
Glucose, UA: NEGATIVE
Ketones, UA: NEGATIVE
Leukocytes, UA: NEGATIVE
Nitrite, UA: NEGATIVE
Protein, UA: NEGATIVE
Spec Grav, UA: 1.015 (ref 1.010–1.025)
Urobilinogen, UA: 0.2 E.U./dL
pH, UA: 6 (ref 5.0–8.0)

## 2022-11-21 NOTE — Progress Notes (Signed)
Patient ID: [name], female    DOB: [birth year], 72 y.o.   MRN: [id number]  This visit was conducted in person.  BP 114/68   Pulse 88   Temp 98 F (36.7 C)   Resp 16   Ht 5\' 4"  (1.626 m)   Wt 163 lb (73.9 kg)   SpO2 95%   BMI 27.98 kg/m    CC:  Chief Complaint  Patient presents with   Urinary Tract Infection    X 2 weeks Burning and back hurting    Subjective:   HPI: [name] is a 72 y.o. female presenting on 11/21/2022 for Urinary Tract Infection (X 2 weeks Burning and back hurting)  Urinary Tract Infection  This is a new problem. The current episode started in the past 7 days. The problem has been gradually worsening. The quality of the pain is described as burning. The pain is at a severity of 6/10. There has been no fever. Associated symptoms include chills, frequency, nausea and urgency. Pertinent negatives include no discharge, flank pain, hematuria or vomiting. Associated symptoms comments:  Low back pain and lower central abdominal pressure, decreased appetite. She has tried increased fluids for the symptoms. There is no history of catheterization, kidney stones, recurrent UTIs, a single kidney or a urological procedure. no recent UTIs,  no recent antibitoics    She has been eating more spicy corn... had diarrhea for 4-5 days.      Relevant past medical, surgical, family and social history reviewed and updated as indicated. Interim medical history since our last visit reviewed. Allergies and medications reviewed and updated. Outpatient Medications Prior to Visit  Medication Sig Dispense Refill   acetaminophen (TYLENOL) 500 MG tablet Take 1,000 mg by mouth 2 (two) times daily as needed (pain).     aspirin 81 MG chewable tablet Chew 1 tablet (81 mg total) by mouth daily. 30 tablet 0   carvedilol (COREG) 3.125 MG tablet TAKE 1 TABLET BY MOUTH TWICE A DAY WITH FOOD 180 tablet 1   Cholecalciferol (VITAMIN D3 PO) Take 1,000 Units by mouth in the morning and at  bedtime.     diphenhydrAMINE (BENADRYL CHILDRENS ALLERGY) 12.5 MG/5ML liquid Take 25 mg by mouth at bedtime.     ELIQUIS 5 MG TABS tablet TAKE 1 TABLET BY MOUTH TWICE A DAY 180 tablet 1   ezetimibe (ZETIA) 10 MG tablet TAKE 1 TABLET BY MOUTH EVERY DAY 90 tablet 1   famotidine (PEPCID) 20 MG tablet TAKE 1 TABLET BY MOUTH TWICE A DAY (Patient taking differently: Take 20 mg by mouth daily.) 180 tablet 1   furosemide (LASIX) 20 MG tablet TAKE 1 TABLET BY MOUTH TWICE A DAY 180 tablet 1   Magnesium 400 MG TABS Take 400 mg by mouth 2 (two) times daily.      rosuvastatin (CRESTOR) 10 MG tablet TAKE 1 TABLET (10 MG TOTAL) BY MOUTH DAILY AT 6 PM. 90 tablet 1   sertraline (ZOLOFT) 50 MG tablet TAKE 1 TABLET BY MOUTH EVERYDAY AT BEDTIME 90 tablet 1   vitamin B-12 (CYANOCOBALAMIN) 1000 MCG tablet Take 1,000 mcg by mouth daily.     amoxicillin (AMOXIL) 500 MG capsule Take 2 capsules (1,000 mg total) by mouth 2 (two) times daily. 40 capsule 0   No facility-administered medications prior to visit.     Per HPI unless specifically indicated in ROS section below Review of Systems  Constitutional:  Positive for chills.  Gastrointestinal:  Positive for nausea.  Negative for vomiting.  Genitourinary:  Positive for frequency and urgency. Negative for flank pain and hematuria.   Objective:  BP 114/68   Pulse 88   Temp 98 F (36.7 C)   Resp 16   Ht 5\' 4"  (1.626 m)   Wt 163 lb (73.9 kg)   SpO2 95%   BMI 27.98 kg/m   Wt Readings from Last 3 Encounters:  11/21/22 163 lb (73.9 kg)  07/21/22 164 lb 3.2 oz (74.5 kg)  05/01/22 159 lb 3.2 oz (72.2 kg)      Physical Exam Constitutional:      General: She is not in acute distress.    Appearance: Normal appearance. She is well-developed. She is not ill-appearing or toxic-appearing.  HENT:     Head: Normocephalic.     Right Ear: Hearing, tympanic membrane, ear canal and external ear normal. Tympanic membrane is not erythematous, retracted or bulging.     Left  Ear: Hearing, tympanic membrane, ear canal and external ear normal. Tympanic membrane is not erythematous, retracted or bulging.     Nose: No mucosal edema or rhinorrhea.     Right Sinus: No maxillary sinus tenderness or frontal sinus tenderness.     Left Sinus: No maxillary sinus tenderness or frontal sinus tenderness.     Mouth/Throat:     Pharynx: Uvula midline.  Eyes:     General: Lids are normal. Lids are everted, no foreign bodies appreciated.     Conjunctiva/sclera: Conjunctivae normal.     Pupils: Pupils are equal, round, and reactive to light.  Neck:     Thyroid: No thyroid mass or thyromegaly.     Vascular: No carotid bruit.     Trachea: Trachea normal.  Cardiovascular:     Rate and Rhythm: Normal rate and regular rhythm.     Pulses: Normal pulses.     Heart sounds: Normal heart sounds, S1 normal and S2 normal. No murmur heard.    No friction rub. No gallop.  Pulmonary:     Effort: Pulmonary effort is normal. No tachypnea or respiratory distress.     Breath sounds: Normal breath sounds. No decreased breath sounds, wheezing, rhonchi or rales.  Abdominal:     General: Bowel sounds are normal.     Palpations: Abdomen is soft.     Tenderness: There is no abdominal tenderness.  Musculoskeletal:     Cervical back: Normal range of motion and neck supple.  Skin:    General: Skin is warm and dry.     Findings: No rash.  Neurological:     Mental Status: She is alert.  Psychiatric:        Mood and Affect: Mood is not anxious or depressed.        Speech: Speech normal.        Behavior: Behavior normal. Behavior is cooperative.        Thought Content: Thought content normal.        Judgment: Judgment normal.       Results for orders placed or performed in visit on 11/21/22  POCT Urinalysis Dipstick (Automated)  Result Value Ref Range   Color, UA yellow    Clarity, UA Cloudy    Glucose, UA Negative Negative   Bilirubin, UA Negative    Ketones, UA Negative    Spec Grav,  UA 1.015 1.010 - 1.025   Blood, UA Negative    pH, UA 6.0 5.0 - 8.0   Protein, UA Negative Negative   Urobilinogen, UA  0.2 0.2 or 1.0 E.U./dL   Nitrite, UA Negative    Leukocytes, UA Negative Negative    Assessment and Plan  Burning with urination Assessment & Plan: .Acute, urinalysis within normal limits.  Symptoms most likely secondary to recent spicy foods and other bladder irritants. No clear sign of bacterial urinary infection.  There is a not enough sample to send her urine for culture. Recommended increase in water to flush out bladder, avoidance of bladder triggers.  Return and ER precautions provided  Orders: -     POCT Urinalysis Dipstick (Automated)    No follow-ups on file.   Kerby Nora, MD

## 2022-11-21 NOTE — Patient Instructions (Signed)
Push water intake.  Avoid spicy foods, hold strawberries.  We will call with urine culture.

## 2022-11-21 NOTE — Assessment & Plan Note (Signed)
.  Acute, urinalysis within normal limits.  Symptoms most likely secondary to recent spicy foods and other bladder irritants. No clear sign of bacterial urinary infection.  There is a not enough sample to send her urine for culture. Recommended increase in water to flush out bladder, avoidance of bladder triggers.  Return and ER precautions provided

## 2022-12-16 ENCOUNTER — Ambulatory Visit (INDEPENDENT_AMBULATORY_CARE_PROVIDER_SITE_OTHER): Payer: Medicare HMO

## 2022-12-16 VITALS — Ht 64.0 in | Wt 163.0 lb

## 2022-12-16 DIAGNOSIS — Z0000 Encounter for general adult medical examination without abnormal findings: Secondary | ICD-10-CM

## 2022-12-16 NOTE — Progress Notes (Signed)
Subjective:   [name] is a 72 y.o. female who presents for Medicare Annual (Subsequent) preventive examination.  Visit Complete: Virtual  I connected with  [name] on 12/16/22 by a audio enabled telemedicine application and verified that I am speaking with the correct person using two identifiers.  Patient Location: Home  Provider Location: Home Office  I discussed the limitations of evaluation and management by telemedicine. The patient expressed understanding and agreed to proceed.  Patient Medicare AWV questionnaire was completed by the patient on ; I have confirmed that all information answered by patient is correct and no changes since this date.  Review of Systems      Cardiac Risk Factors include: advanced age (>81men, >25 women);hypertension     Objective:    Today's Vitals   12/16/22 1542  Weight: 163 lb (73.9 kg)  Height: 5\' 4"  (1.626 m)   Body mass index is 27.98 kg/m.     12/16/2022    3:49 PM 07/31/2020    7:00 PM 07/30/2020    5:04 PM 06/12/2020    3:21 PM 04/14/2020    5:17 AM 01/04/2020    1:28 PM 08/24/2019    4:18 PM  Advanced Directives  Does Patient Have a Medical Advance Directive? No No No Yes No No No  Type of Advance Directive    Out of facility DNR (pink MOST or yellow form)     Does patient want to make changes to medical advance directive?  No - Patient declined  No - Patient declined     Would patient like information on creating a medical advance directive? No - Patient declined  No - Patient declined  No - Patient declined No - Patient declined No - Patient declined    Current Medications (verified) Outpatient Encounter Medications as of 12/16/2022  Medication Sig   acetaminophen (TYLENOL) 500 MG tablet Take 1,000 mg by mouth 2 (two) times daily as needed (pain).   amoxicillin (AMOXIL) 500 MG capsule Take 2 capsules (1,000 mg total) by mouth 2 (two) times daily.   aspirin 81 MG chewable tablet Chew 1 tablet (81 mg total) by  mouth daily.   carvedilol (COREG) 3.125 MG tablet TAKE 1 TABLET BY MOUTH TWICE A DAY WITH FOOD   Cholecalciferol (VITAMIN D3 PO) Take 1,000 Units by mouth in the morning and at bedtime.   diphenhydrAMINE (BENADRYL CHILDRENS ALLERGY) 12.5 MG/5ML liquid Take 25 mg by mouth at bedtime.   ELIQUIS 5 MG TABS tablet TAKE 1 TABLET BY MOUTH TWICE A DAY   ezetimibe (ZETIA) 10 MG tablet TAKE 1 TABLET BY MOUTH EVERY DAY   famotidine (PEPCID) 20 MG tablet TAKE 1 TABLET BY MOUTH TWICE A DAY (Patient taking differently: Take 20 mg by mouth daily.)   furosemide (LASIX) 20 MG tablet TAKE 1 TABLET BY MOUTH TWICE A DAY   Magnesium 400 MG TABS Take 400 mg by mouth 2 (two) times daily.    rosuvastatin (CRESTOR) 10 MG tablet TAKE 1 TABLET (10 MG TOTAL) BY MOUTH DAILY AT 6 PM.   sertraline (ZOLOFT) 50 MG tablet TAKE 1 TABLET BY MOUTH EVERYDAY AT BEDTIME   vitamin B-12 (CYANOCOBALAMIN) 1000 MCG tablet Take 1,000 mcg by mouth daily.   No facility-administered encounter medications on file as of 12/16/2022.    Allergies (verified) Shellfish-derived products, Ativan [lorazepam], Buspar [buspirone], Clarithromycin, Codeine, Doxycycline, Guaifenesin, Oxycodone, Prednisone, Statins, Sulfonamide derivatives, Tetracycline, Vicodin [hydrocodone-acetaminophen], Famotidine, Latex, Omeprazole, Peanut-containing drug products, Protonix [pantoprazole], and Wellbutrin [bupropion]  History: Past Medical History:  Diagnosis Date   Allergy    Anxiety    Arnold-Chiari malformation (HCC)    Arthritis    CHF (congestive heart failure) (HCC)    Chronic systolic heart failure (HCC) 11/03/2019   Coronary artery disease    Dyspnea    Encounter for assessment of implantable cardioverter-defibrillator (ICD) 11/10/2019   GERD (gastroesophageal reflux disease)    H. pylori infection    3 years ago   H/O cardiac arrest    Hx of VT/Vfib arrest   Hyperlipidemia    ICD BiV Medtronic model [id number] Claria Quad CRT-D SureScan ICD 08/25/2019     Incontinence    Paroxysmal atrial fibrillation (HCC) 08/06/2019   Pulmonary edema 2021   Syncope and collapse    Per pt, denies passing out   Tobacco use disorder    Unspecified essential hypertension    Past Surgical History:  Procedure Laterality Date   APPENDECTOMY     ARNOLD CHIARI SURGERY     neurocranial surgery   BIV ICD INSERTION CRT-D N/A 08/25/2019   Procedure: BIV ICD INSERTION CRT-D;  Surgeon: Regan Lemming, MD;  Location: [facility] INVASIVE CV LAB;  Service: Cardiovascular;  Laterality: N/A;   BLEPHAROPLASTY     Bil   C-EYE SURGERY PROCEDURE     CARDIAC CATHETERIZATION     CHOLECYSTECTOMY     EYE SURGERY     heart failure     LEFT HEART CATH AND CORONARY ANGIOGRAPHY N/A 08/04/2019   Procedure: LEFT HEART CATH AND CORONARY ANGIOGRAPHY;  Surgeon: Yates Decamp, MD;  Location: [facility] INVASIVE CV LAB;  Service: Cardiovascular;  Laterality: N/A;   MASS EXCISION Right 12/27/2013   Procedure: MINOR EXCISION OF RIGHT THUMB MUCOID CYST, DEBRIDEMENT OF INTERPHALANGEAL JOINT;  Surgeon: Wyn Forster, MD;  Location: [facility];  Service: Orthopedics;  Laterality: Right;   mass on thumb  right   TOTAL KNEE ARTHROPLASTY Right 02/17/2017   TOTAL KNEE ARTHROPLASTY Right 02/17/2017   Procedure: TOTAL KNEE ARTHROPLASTY;  Surgeon: Marcene Corning, MD;  Location: [facility] OR;  Service: Orthopedics;  Laterality: Right;   TUBAL LIGATION     Family History  Problem Relation Age of Onset   Bone cancer Mother    Heart disease Mother    Cancer Sister        metastatic; unknown primary   Diverticulosis Sister    Tuberculosis Father    Hypertension Sister    Colon cancer Neg Hx    Esophageal cancer Neg Hx    Pancreatic cancer Neg Hx    Stomach cancer Neg Hx    Liver disease Neg Hx    Kidney disease Neg Hx    Rectal cancer Neg Hx    Pancreatic disease Neg Hx    Social History   Socioeconomic History   Marital status: Married    Spouse name: Not on file   Number of  children: 2   Years of education: Not on file   Highest education level: Not on file  Occupational History   Occupation: hair dresser  Tobacco Use   Smoking status: Former    Packs/day: 1.00    Years: 50.00    Additional pack years: 0.00    Total pack years: 50.00    Types: Cigarettes    Quit date: 08/08/2019    Years since quitting: 3.3   Smokeless tobacco: Never  Vaping Use   Vaping Use: Never used  Substance and Sexual Activity  Alcohol use: No    Alcohol/week: 0.0 standard drinks of alcohol   Drug use: No   Sexual activity: Not on file  Other Topics Concern   Not on file  Social History Narrative   Not on file   Social Determinants of Health   Financial Resource Strain: Low Risk  (12/16/2022)   Overall Financial Resource Strain (CARDIA)    Difficulty of Paying Living Expenses: Not hard at all  Food Insecurity: No Food Insecurity (12/16/2022)   Hunger Vital Sign    Worried About Running Out of Food in the Last Year: Never true    Ran Out of Food in the Last Year: Never true  Transportation Needs: No Transportation Needs (12/16/2022)   PRAPARE - Administrator, Civil Service (Medical): No    Lack of Transportation (Non-Medical): No  Physical Activity: Insufficiently Active (12/16/2022)   Exercise Vital Sign    Days of Exercise per Week: 5 days    Minutes of Exercise per Session: 20 min  Stress: No Stress Concern Present (12/16/2022)   [employer] of Occupational Health - Occupational Stress Questionnaire    Feeling of Stress : Not at all  Social Connections: Moderately Isolated (12/16/2022)   Social Connection and Isolation Panel [NHANES]    Frequency of Communication with Friends and Family: More than three times a week    Frequency of Social Gatherings with Friends and Family: More than three times a week    Attends Religious Services: Never    Database administrator or Organizations: No    Attends Engineer, structural: Never    Marital Status:  Married    Tobacco Counseling Counseling given: Not Answered   Clinical Intake:  Pre-visit preparation completed: No  Pain : No/denies pain     BMI - recorded: 27.98 Nutritional Status: BMI 25 -29 Overweight Nutritional Risks: None Diabetes: No  How often do you need to have someone help you when you read instructions, pamphlets, or other written materials from your doctor or pharmacy?: 1 - Never  Interpreter Needed?: No  Information entered by :: Theresa Mulligan LPN   Activities of Daily Living    12/16/2022    3:46 PM  In your present state of health, do you have any difficulty performing the following activities:  Hearing? 0  Vision? 0  Difficulty concentrating or making decisions? 0  Walking or climbing stairs? 0  Dressing or bathing? 0  Doing errands, shopping? 0  Preparing Food and eating ? N  Using the Toilet? N  In the past six months, have you accidently leaked urine? Y  Comment Wears breifs. Followed by Urologist  Do you have problems with loss of bowel control? N  Managing your Medications? N  Managing your Finances? N  Housekeeping or managing your Housekeeping? N    Patient Care Team: Excell Seltzer, MD as PCP - General (Family Medicine) Eustaquio Boyden, MD (Family Medicine) Tessa Lerner, DO as Consulting Physician (Cardiology) Kathyrn Sheriff, [facility] (Inactive) as Pharmacist (Pharmacist)  Indicate any recent Medical Services you may have received from other than [facility] providers in the past year (date may be approximate).     Assessment:   This is a routine wellness examination for [name].  Hearing/Vision screen Hearing Screening - Comments:: Denies hearing difficulties   Vision Screening - Comments:: Wears rx glasses - up to date with routine eye exams with  Dr Dione Booze  Dietary issues and exercise activities discussed:  Goals Addressed               This Visit's Progress     Stay Healthy (pt-stated)         Depression  Screen    12/16/2022    3:45 PM 11/21/2022    1:59 PM 09/10/2022   12:10 PM 10/04/2021   12:00 PM 07/27/2020    1:06 PM 04/23/2020   12:11 PM 04/23/2020   12:10 PM  PHQ 2/9 Scores  PHQ - 2 Score 0 0 0 0 2 1 1   PHQ- 9 Score 0 2 6 0 6 7     Fall Risk    12/16/2022    3:48 PM 09/10/2022   12:10 PM 10/04/2021   12:00 PM 12/23/2019   10:26 AM 09/21/2017    8:51 AM  Fall Risk   Falls in the past year? 0 0 0 0 No  Number falls in past yr: 0 0 0 0   Comment    more of a stumble up the stairs   Injury with Fall? 0 0 0 1   Comment    right knee - twisted when stumbled up stairs   Risk for fall due to : No Fall Risks No Fall Risks No Fall Risks    Follow up Falls prevention discussed Falls evaluation completed Falls evaluation completed      MEDICARE RISK AT HOME:  Medicare Risk at Home - 12/16/22 1555     Any stairs in or around the home? No    If so, are there any without handrails? No    Home free of loose throw rugs in walkways, pet beds, electrical cords, etc? Yes    Adequate lighting in your home to reduce risk of falls? Yes    Life alert? No    Use of a cane, walker or w/c? No    Grab bars in the bathroom? Yes    Shower chair or bench in shower? Yes    Elevated toilet seat or a handicapped toilet? Yes             TIMED UP AND GO:  Was the test performed?  No    Cognitive Function:        12/16/2022    3:49 PM  6CIT Screen  What Year? 0 points  What month? 0 points  What time? 0 points  Count back from 20 0 points  Months in reverse 0 points  Repeat phrase 0 points  Total Score 0 points    Immunizations Immunization History  Administered Date(s) Administered   Influenza Split 07/01/2011   PFIZER(Purple Top)SARS-COV-2 Vaccination 02/16/2020, 03/08/2020, 10/05/2020   PNEUMOCOCCAL CONJUGATE-20 10/04/2021   Zoster, Live 12/22/2011    TDAP status: Due, Education has been provided regarding the importance of this vaccine. Advised may receive this vaccine at local  pharmacy or Health Dept. Aware to provide a copy of the vaccination record if obtained from local pharmacy or Health Dept. Verbalized acceptance and understanding.   Pneumococcal vaccine status: Up to date  Covid-19 vaccine status: Completed vaccines  Qualifies for Shingles Vaccine? Yes   Zostavax completed No   Shingrix Completed?: No.    Education has been provided regarding the importance of this vaccine. Patient has been advised to call insurance company to determine out of pocket expense if they have not yet received this vaccine. Advised may also receive vaccine at local pharmacy or Health Dept. Verbalized acceptance and understanding.  Screening Tests Health Maintenance  Topic Date Due   DTaP/Tdap/Td (1 - Tdap) Never done   Lung Cancer Screening  Never done   Zoster Vaccines- Shingrix (1 of 2) Never done   DEXA SCAN  Never done   Colonoscopy  07/08/2018   MAMMOGRAM  07/26/2020   COVID-19 Vaccine (4 - 2023-24 season) 02/14/2022   INFLUENZA VACCINE  01/15/2023   Medicare Annual Wellness (AWV)  12/16/2023   Pneumonia Vaccine 68+ Years old  Completed   Hepatitis C Screening  Completed   HPV VACCINES  Aged Out    Health Maintenance  Health Maintenance Due  Topic Date Due   DTaP/Tdap/Td (1 - Tdap) Never done   Lung Cancer Screening  Never done   Zoster Vaccines- Shingrix (1 of 2) Never done   DEXA SCAN  Never done   Colonoscopy  07/08/2018   MAMMOGRAM  07/26/2020   COVID-19 Vaccine (4 - 2023-24 season) 02/14/2022    Colorectal cancer screening: Referral to GI placed Deferred. Pt aware the office will call re: appt.  Mammogram status: Ordered 09/04/22. Pt provided with contact info and advised to call to schedule appt.   Bone Density status: Ordered Deferred. Pt provided with contact info and advised to call to schedule appt.  Lung Cancer Screening: (Low Dose CT Chest recommended if Age 59-80 years, 20 pack-year currently smoking OR have quit w/in 15years.) does not  qualify.     Additional Screening:  Hepatitis C Screening: does qualify; Completed 09/27/21  Vision Screening: Recommended annual ophthalmology exams for early detection of glaucoma and other disorders of the eye. Is the patient up to date with their annual eye exam?  Yes  Who is the provider or what is the name of the office in which the patient attends annual eye exams? Dr Dione Booze If pt is not established with a provider, would they like to be referred to a provider to establish care? No .   Dental Screening: Recommended annual dental exams for proper oral hygiene    Community Resource Referral / Chronic Care Management:  CRR required this visit?  No   CCM required this visit?  No     Plan:     I have personally reviewed and noted the following in the patient's chart:   Medical and social history Use of alcohol, tobacco or illicit drugs  Current medications and supplements including opioid prescriptions. Patient is not currently taking opioid prescriptions. Functional ability and status Nutritional status Physical activity Advanced directives List of other physicians Hospitalizations, surgeries, and ER visits in previous 12 months Vitals Screenings to include cognitive, depression, and falls Referrals and appointments  In addition, I have reviewed and discussed with patient certain preventive protocols, quality metrics, and best practice recommendations. A written personalized care plan for preventive services as well as general preventive health recommendations were provided to patient.     Tillie Rung, LPN   [birth year]/14/1913   After Visit Summary: (MyChart) Due to this being a telephonic visit, the after visit summary with patients personalized plan was offered to patient via MyChart   Nurse Notes: None

## 2022-12-16 NOTE — Patient Instructions (Addendum)
[name] , Thank you for taking time to come for your Medicare Wellness Visit. I appreciate your ongoing commitment to your health goals. Please review the following plan we discussed and let me know if I can assist you in the future.   These are the goals we discussed:  Goals       Stay Healthy (pt-stated)      Track and Manage My Blood Pressure-Hypertension      Timeframe:  Long-Range Goal Priority:  High Start Date:     09/13/21                        Expected End Date:    09/14/22                   Follow Up Date June 2023   - check blood pressure daily - choose a place to take my blood pressure (home, clinic or office, retail store) - write blood pressure results in a log or diary    Why is this important?   You won't feel high blood pressure, but it can still hurt your blood vessels.  High blood pressure can cause heart or kidney problems. It can also cause a stroke.  Making lifestyle changes like losing a little weight or eating less salt will help.  Checking your blood pressure at home and at different times of the day can help to control blood pressure.  If the doctor prescribes medicine remember to take it the way the doctor ordered.  Call the office if you cannot afford the medicine or if there are questions about it.     Notes:         This is a list of the screening recommended for you and due dates:  Health Maintenance  Topic Date Due   DTaP/Tdap/Td vaccine (1 - Tdap) Never done   Screening for Lung Cancer  Never done   Zoster (Shingles) Vaccine (1 of 2) Never done   DEXA scan (bone density measurement)  Never done   Colon Cancer Screening  07/08/2018   Mammogram  07/26/2020   COVID-19 Vaccine (4 - 2023-24 season) 02/14/2022   Flu Shot  01/15/2023   Medicare Annual Wellness Visit  12/16/2023   Pneumonia Vaccine  Completed   Hepatitis C Screening  Completed   HPV Vaccine  Aged Out    Advanced directives: Advance directive discussed with you today. Even  though you declined this today, please call our office should you change your mind, and we can give you the proper paperwork for you to fill out.   Conditions/risks identified: None  Next appointment: Follow up in one year for your annual wellness visit    Preventive Care 65 Years and Older, Female Preventive care refers to lifestyle choices and visits with your health care provider that can promote health and wellness. What does preventive care include? A yearly physical exam. This is also called an annual well check. Dental exams once or twice a year. Routine eye exams. Ask your health care provider how often you should have your eyes checked. Personal lifestyle choices, including: Daily care of your teeth and gums. Regular physical activity. Eating a healthy diet. Avoiding tobacco and drug use. Limiting alcohol use. Practicing safe sex. Taking low-dose aspirin every day. Taking vitamin and mineral supplements as recommended by your health care provider. What happens during an annual well check? The services and screenings done by your health care provider during  your annual well check will depend on your age, overall health, lifestyle risk factors, and family history of disease. Counseling  Your health care provider may ask you questions about your: Alcohol use. Tobacco use. Drug use. Emotional well-being. Home and relationship well-being. Sexual activity. Eating habits. History of falls. Memory and ability to understand (cognition). Work and work Astronomer. Reproductive health. Screening  You may have the following tests or measurements: Height, weight, and BMI. Blood pressure. Lipid and cholesterol levels. These may be checked every 5 years, or more frequently if you are over 17 years old. Skin check. Lung cancer screening. You may have this screening every year starting at age 69 if you have a 30-pack-year history of smoking and currently smoke or have quit within  the past 15 years. Fecal occult blood test (FOBT) of the stool. You may have this test every year starting at age 62. Flexible sigmoidoscopy or colonoscopy. You may have a sigmoidoscopy every 5 years or a colonoscopy every 10 years starting at age 33. Hepatitis C blood test. Hepatitis B blood test. Sexually transmitted disease (STD) testing. Diabetes screening. This is done by checking your blood sugar (glucose) after you have not eaten for a while (fasting). You may have this done every 1-3 years. Bone density scan. This is done to screen for osteoporosis. You may have this done starting at age 64. Mammogram. This may be done every 1-2 years. Talk to your health care provider about how often you should have regular mammograms. Talk with your health care provider about your test results, treatment options, and if necessary, the need for more tests. Vaccines  Your health care provider may recommend certain vaccines, such as: Influenza vaccine. This is recommended every year. Tetanus, diphtheria, and acellular pertussis (Tdap, Td) vaccine. You may need a Td booster every 10 years. Zoster vaccine. You may need this after age 22. Pneumococcal 13-valent conjugate (PCV13) vaccine. One dose is recommended after age 64. Pneumococcal polysaccharide (PPSV23) vaccine. One dose is recommended after age 76. Talk to your health care provider about which screenings and vaccines you need and how often you need them. This information is not intended to replace advice given to you by your health care provider. Make sure you discuss any questions you have with your health care provider. Document Released: 06/29/2015 Document Revised: 02/20/2016 Document Reviewed: 04/03/2015 Elsevier Interactive Patient Education  2017 [employer].  Fall Prevention in the Home Falls can cause injuries. They can happen to people of all ages. There are many things you can do to make your home safe and to help prevent falls. What  can I do on the outside of my home? Regularly fix the edges of walkways and driveways and fix any cracks. Remove anything that might make you trip as you walk through a door, such as a raised step or threshold. Trim any bushes or trees on the path to your home. Use bright outdoor lighting. Clear any walking paths of anything that might make someone trip, such as rocks or tools. Regularly check to see if handrails are loose or broken. Make sure that both sides of any steps have handrails. Any raised decks and porches should have guardrails on the edges. Have any leaves, snow, or ice cleared regularly. Use sand or salt on walking paths during winter. Clean up any spills in your garage right away. This includes oil or grease spills. What can I do in the bathroom? Use night lights. Install grab bars by the toilet and  in the tub and shower. Do not use towel bars as grab bars. Use non-skid mats or decals in the tub or shower. If you need to sit down in the shower, use a plastic, non-slip stool. Keep the floor dry. Clean up any water that spills on the floor as soon as it happens. Remove soap buildup in the tub or shower regularly. Attach bath mats securely with double-sided non-slip rug tape. Do not have throw rugs and other things on the floor that can make you trip. What can I do in the bedroom? Use night lights. Make sure that you have a light by your bed that is easy to reach. Do not use any sheets or blankets that are too big for your bed. They should not hang down onto the floor. Have a firm chair that has side arms. You can use this for support while you get dressed. Do not have throw rugs and other things on the floor that can make you trip. What can I do in the kitchen? Clean up any spills right away. Avoid walking on wet floors. Keep items that you use a lot in easy-to-reach places. If you need to reach something above you, use a strong step stool that has a grab bar. Keep  electrical cords out of the way. Do not use floor polish or wax that makes floors slippery. If you must use wax, use non-skid floor wax. Do not have throw rugs and other things on the floor that can make you trip. What can I do with my stairs? Do not leave any items on the stairs. Make sure that there are handrails on both sides of the stairs and use them. Fix handrails that are broken or loose. Make sure that handrails are as long as the stairways. Check any carpeting to make sure that it is firmly attached to the stairs. Fix any carpet that is loose or worn. Avoid having throw rugs at the top or bottom of the stairs. If you do have throw rugs, attach them to the floor with carpet tape. Make sure that you have a light switch at the top of the stairs and the bottom of the stairs. If you do not have them, ask someone to add them for you. What else can I do to help prevent falls? Wear shoes that: Do not have high heels. Have rubber bottoms. Are comfortable and fit you well. Are closed at the toe. Do not wear sandals. If you use a stepladder: Make sure that it is fully opened. Do not climb a closed stepladder. Make sure that both sides of the stepladder are locked into place. Ask someone to hold it for you, if possible. Clearly mark and make sure that you can see: Any grab bars or handrails. First and last steps. Where the edge of each step is. Use tools that help you move around (mobility aids) if they are needed. These include: Canes. Walkers. Scooters. Crutches. Turn on the lights when you go into a dark area. Replace any light bulbs as soon as they burn out. Set up your furniture so you have a clear path. Avoid moving your furniture around. If any of your floors are uneven, fix them. If there are any pets around you, be aware of where they are. Review your medicines with your doctor. Some medicines can make you feel dizzy. This can increase your chance of falling. Ask your doctor  what other things that you can do to help prevent falls. This  information is not intended to replace advice given to you by your health care provider. Make sure you discuss any questions you have with your health care provider. Document Released: 03/29/2009 Document Revised: 11/08/2015 Document Reviewed: 07/07/2014 Elsevier Interactive Patient Education  2017 [employer].

## 2022-12-19 DIAGNOSIS — Z4502 Encounter for adjustment and management of automatic implantable cardiac defibrillator: Secondary | ICD-10-CM | POA: Diagnosis not present

## 2022-12-19 DIAGNOSIS — Z95810 Presence of automatic (implantable) cardiac defibrillator: Secondary | ICD-10-CM | POA: Diagnosis not present

## 2022-12-19 DIAGNOSIS — I4901 Ventricular fibrillation: Secondary | ICD-10-CM | POA: Diagnosis not present

## 2022-12-19 DIAGNOSIS — I5022 Chronic systolic (congestive) heart failure: Secondary | ICD-10-CM | POA: Diagnosis not present

## 2022-12-23 DIAGNOSIS — H33193 Other retinoschisis and retinal cysts, bilateral: Secondary | ICD-10-CM | POA: Diagnosis not present

## 2022-12-23 DIAGNOSIS — H2511 Age-related nuclear cataract, right eye: Secondary | ICD-10-CM | POA: Diagnosis not present

## 2023-01-02 DIAGNOSIS — I4901 Ventricular fibrillation: Secondary | ICD-10-CM | POA: Diagnosis not present

## 2023-01-02 DIAGNOSIS — H25811 Combined forms of age-related cataract, right eye: Secondary | ICD-10-CM | POA: Diagnosis not present

## 2023-01-02 DIAGNOSIS — I5022 Chronic systolic (congestive) heart failure: Secondary | ICD-10-CM | POA: Diagnosis not present

## 2023-01-02 DIAGNOSIS — Z95810 Presence of automatic (implantable) cardiac defibrillator: Secondary | ICD-10-CM | POA: Diagnosis not present

## 2023-01-02 DIAGNOSIS — Z4502 Encounter for adjustment and management of automatic implantable cardiac defibrillator: Secondary | ICD-10-CM | POA: Diagnosis not present

## 2023-01-08 DIAGNOSIS — I6523 Occlusion and stenosis of bilateral carotid arteries: Secondary | ICD-10-CM | POA: Diagnosis not present

## 2023-01-08 DIAGNOSIS — Z8673 Personal history of transient ischemic attack (TIA), and cerebral infarction without residual deficits: Secondary | ICD-10-CM | POA: Diagnosis not present

## 2023-01-12 ENCOUNTER — Ambulatory Visit: Payer: Medicare HMO

## 2023-01-12 ENCOUNTER — Other Ambulatory Visit: Payer: Self-pay | Admitting: Cardiology

## 2023-01-12 DIAGNOSIS — Z8673 Personal history of transient ischemic attack (TIA), and cerebral infarction without residual deficits: Secondary | ICD-10-CM

## 2023-01-12 DIAGNOSIS — I6523 Occlusion and stenosis of bilateral carotid arteries: Secondary | ICD-10-CM

## 2023-01-15 DIAGNOSIS — I509 Heart failure, unspecified: Secondary | ICD-10-CM | POA: Diagnosis not present

## 2023-01-15 DIAGNOSIS — N184 Chronic kidney disease, stage 4 (severe): Secondary | ICD-10-CM | POA: Diagnosis not present

## 2023-01-15 DIAGNOSIS — I129 Hypertensive chronic kidney disease with stage 1 through stage 4 chronic kidney disease, or unspecified chronic kidney disease: Secondary | ICD-10-CM | POA: Diagnosis not present

## 2023-01-16 DIAGNOSIS — H25812 Combined forms of age-related cataract, left eye: Secondary | ICD-10-CM | POA: Diagnosis not present

## 2023-01-19 ENCOUNTER — Encounter: Payer: Self-pay | Admitting: Cardiology

## 2023-01-19 ENCOUNTER — Ambulatory Visit: Payer: Medicare HMO | Admitting: Cardiology

## 2023-01-19 VITALS — BP 127/70 | HR 88 | Resp 16 | Ht 64.0 in | Wt 165.6 lb

## 2023-01-19 DIAGNOSIS — Z87891 Personal history of nicotine dependence: Secondary | ICD-10-CM | POA: Diagnosis not present

## 2023-01-19 DIAGNOSIS — I428 Other cardiomyopathies: Secondary | ICD-10-CM

## 2023-01-19 DIAGNOSIS — I5022 Chronic systolic (congestive) heart failure: Secondary | ICD-10-CM | POA: Diagnosis not present

## 2023-01-19 DIAGNOSIS — Z8674 Personal history of sudden cardiac arrest: Secondary | ICD-10-CM | POA: Diagnosis not present

## 2023-01-19 DIAGNOSIS — I6523 Occlusion and stenosis of bilateral carotid arteries: Secondary | ICD-10-CM | POA: Diagnosis not present

## 2023-01-19 DIAGNOSIS — Z8679 Personal history of other diseases of the circulatory system: Secondary | ICD-10-CM

## 2023-01-19 DIAGNOSIS — Z7901 Long term (current) use of anticoagulants: Secondary | ICD-10-CM

## 2023-01-19 DIAGNOSIS — Z95810 Presence of automatic (implantable) cardiac defibrillator: Secondary | ICD-10-CM | POA: Diagnosis not present

## 2023-01-19 DIAGNOSIS — Z8673 Personal history of transient ischemic attack (TIA), and cerebral infarction without residual deficits: Secondary | ICD-10-CM

## 2023-01-19 DIAGNOSIS — E782 Mixed hyperlipidemia: Secondary | ICD-10-CM

## 2023-01-19 DIAGNOSIS — I5020 Unspecified systolic (congestive) heart failure: Secondary | ICD-10-CM

## 2023-01-19 DIAGNOSIS — I480 Paroxysmal atrial fibrillation: Secondary | ICD-10-CM

## 2023-01-19 DIAGNOSIS — I447 Left bundle-branch block, unspecified: Secondary | ICD-10-CM

## 2023-01-19 DIAGNOSIS — Z4502 Encounter for adjustment and management of automatic implantable cardiac defibrillator: Secondary | ICD-10-CM | POA: Diagnosis not present

## 2023-01-19 DIAGNOSIS — I4901 Ventricular fibrillation: Secondary | ICD-10-CM | POA: Diagnosis not present

## 2023-01-19 NOTE — Progress Notes (Unsigned)
[name] Date of Birth: [birth year] MRN: [id number] Primary Care Provider:Bedsole, Luberta Robertson, MD Primary Cardiologist: Tessa Lerner, DO, [facility] Electrophysiologist: Dr. Lorenso Courier  Nephrologist: Dr. Kathrene Bongo  Date: 01/19/23 Last Office Visit: 07/21/2022.  Chief Complaint  Patient presents with   Chronic HFrEF (heart failure with reduced ejection fraction   Follow-up   HPI  [name] is a 72 y.o. female whose past medical history and cardiac risk factors include: Hypertension, hyperlipidemia, former smoker, history of Arnold-Chiari malformation, history of VT/VF, history of torsades, cardiac arrest, nonischemic cardiomyopathy, peripheral vascular disease, paroxysmal atrial fibrillation, left bundle branch block, history of CVA.  Patient presents to the office for regular follow-up visit given her complex cardiovascular history.  In 21 she presented to the ED with V. tach/V-fib arrest due to torsades.  He was diagnosed with nonischemic cardiomyopathy and eventually went home on a LifeVest which successfully defibrillated her at her next V-fib event and subsequently underwent BiV ICD implant.  Her medications have been uptitrated slowly but difficult given her chronic kidney disease, soft blood pressures and hyperkalemia in the past.  Since last office visit she has not had any anginal chest pain or heart failure symptoms.  Her oral intake has improved.  She continues to follow-up with nephrology for CKD.  She had a carotid duplex in the last office visit still pending.  Device interrogation report is reviewed with the patient and noted below for further reference.   She is tolerating her current medications well without any side effects or intolerance.  She recently had labs with nephrology at [facility] I do not have them available for review.  Will request records.  ALLERGIES: Allergies  Allergen Reactions   Shellfish-Derived Products Anaphylaxis   Ativan  [Lorazepam] Other (See Comments)    Makes "skin crawl" and insomnia   Buspar [Buspirone] Nausea Only    Sweating and dizzy   Clarithromycin Swelling   Codeine Nausea And Vomiting   Doxycycline Other (See Comments)    Unknown   Guaifenesin Other (See Comments)    Palpitations   Oxycodone Nausea And Vomiting   Prednisone Nausea And Vomiting and Other (See Comments)    Makes my heart race   Statins Other (See Comments)    Muscle pain   Sulfonamide Derivatives Nausea And Vomiting    Achiness   Tetracycline Nausea Only   Vicodin [Hydrocodone-Acetaminophen] Nausea And Vomiting   Famotidine Rash   Latex Rash   Omeprazole Nausea Only    Cough, shortness of breath - patient doesn't remember   Peanut-Containing Drug Products Itching and Rash   Protonix [Pantoprazole] Rash   Wellbutrin [Bupropion] Palpitations   MEDICATION LIST PRIOR TO VISIT: Current Outpatient Medications on File Prior to Visit  Medication Sig Dispense Refill   acetaminophen (TYLENOL) 500 MG tablet Take 1,000 mg by mouth 2 (two) times daily as needed (pain).     aspirin 81 MG chewable tablet Chew 1 tablet (81 mg total) by mouth daily. 30 tablet 0   carvedilol (COREG) 3.125 MG tablet TAKE 1 TABLET BY MOUTH TWICE A DAY WITH FOOD 180 tablet 1   Cholecalciferol (VITAMIN D3 PO) Take 1,000 Units by mouth in the morning and at bedtime.     diphenhydrAMINE (BENADRYL CHILDRENS ALLERGY) 12.5 MG/5ML liquid Take 25 mg by mouth at bedtime.     ELIQUIS 5 MG TABS tablet TAKE 1 TABLET BY MOUTH TWICE A DAY 180 tablet 1   ezetimibe (ZETIA) 10 MG tablet TAKE 1 TABLET BY  MOUTH EVERY DAY 90 tablet 1   famotidine (PEPCID) 20 MG tablet TAKE 1 TABLET BY MOUTH TWICE A DAY (Patient taking differently: Take 20 mg by mouth daily.) 180 tablet 1   furosemide (LASIX) 20 MG tablet TAKE 1 TABLET BY MOUTH TWICE A DAY 180 tablet 1   Magnesium 400 MG TABS Take 400 mg by mouth 2 (two) times daily.      rosuvastatin (CRESTOR) 10 MG tablet TAKE 1 TABLET (10  MG TOTAL) BY MOUTH DAILY AT 6 PM. 90 tablet 1   sertraline (ZOLOFT) 50 MG tablet TAKE 1 TABLET BY MOUTH EVERYDAY AT BEDTIME 90 tablet 1   vitamin B-12 (CYANOCOBALAMIN) 1000 MCG tablet Take 1,000 mcg by mouth daily.     No current facility-administered medications on file prior to visit.    PAST MEDICAL HISTORY: Past Medical History:  Diagnosis Date   Allergy    Anxiety    Arnold-Chiari malformation (HCC)    Arthritis    CHF (congestive heart failure) (HCC)    Chronic systolic heart failure (HCC) 11/03/2019   Coronary artery disease    Dyspnea    Encounter for assessment of implantable cardioverter-defibrillator (ICD) 11/10/2019   GERD (gastroesophageal reflux disease)    H. pylori infection    3 years ago   H/O cardiac arrest    Hx of VT/Vfib arrest   Hyperlipidemia    ICD BiV Medtronic model [id number] Claria Quad CRT-D SureScan ICD 08/25/2019    Incontinence    Paroxysmal atrial fibrillation (HCC) 08/06/2019   Pulmonary edema 2021   Syncope and collapse    Per pt, denies passing out   Tobacco use disorder    Unspecified essential hypertension     PAST SURGICAL HISTORY: Past Surgical History:  Procedure Laterality Date   APPENDECTOMY     ARNOLD CHIARI SURGERY     neurocranial surgery   BIV ICD INSERTION CRT-D N/A 08/25/2019   Procedure: BIV ICD INSERTION CRT-D;  Surgeon: Regan Lemming, MD;  Location: [facility] INVASIVE CV LAB;  Service: Cardiovascular;  Laterality: N/A;   BLEPHAROPLASTY     Bil   C-EYE SURGERY PROCEDURE     CARDIAC CATHETERIZATION     CHOLECYSTECTOMY     EYE SURGERY     heart failure     LEFT HEART CATH AND CORONARY ANGIOGRAPHY N/A 08/04/2019   Procedure: LEFT HEART CATH AND CORONARY ANGIOGRAPHY;  Surgeon: Yates Decamp, MD;  Location: [facility] INVASIVE CV LAB;  Service: Cardiovascular;  Laterality: N/A;   MASS EXCISION Right 12/27/2013   Procedure: MINOR EXCISION OF RIGHT THUMB MUCOID CYST, DEBRIDEMENT OF INTERPHALANGEAL JOINT;  Surgeon: Wyn Forster, MD;   Location: [facility];  Service: Orthopedics;  Laterality: Right;   mass on thumb  right   TOTAL KNEE ARTHROPLASTY Right 02/17/2017   TOTAL KNEE ARTHROPLASTY Right 02/17/2017   Procedure: TOTAL KNEE ARTHROPLASTY;  Surgeon: Marcene Corning, MD;  Location: [facility] OR;  Service: Orthopedics;  Laterality: Right;   TUBAL LIGATION      FAMILY HISTORY: The patient family history includes Bone cancer in her mother; Cancer in her sister; Diverticulosis in her sister; Heart disease in her mother; Hypertension in her sister; Tuberculosis in her father.   SOCIAL HISTORY:  The patient  reports that she quit smoking about 3 years ago. Her smoking use included cigarettes. She started smoking about 53 years ago. She has a 50 pack-year smoking history. She has never used smokeless tobacco. She reports that she does not  drink alcohol and does not use drugs.  Review of Systems  Constitutional: Negative for chills, fever and weight loss.  HENT:  Negative for hoarse voice and nosebleeds.   Eyes:  Negative for discharge, double vision and pain.  Cardiovascular:  Negative for chest pain, claudication, dyspnea on exertion, leg swelling, near-syncope, orthopnea, palpitations, paroxysmal nocturnal dyspnea and syncope.  Respiratory:  Negative for hemoptysis and shortness of breath.   Musculoskeletal:  Negative for muscle cramps and myalgias.  Gastrointestinal:  Negative for abdominal pain, constipation, diarrhea, hematemesis, hematochezia, melena, nausea and vomiting.  Neurological:  Negative for dizziness and light-headedness.    PHYSICAL EXAM:    01/19/2023    2:50 PM 12/16/2022    3:42 PM 11/21/2022   12:01 PM  Vitals with BMI  Height 5\' 4"  5\' 4"  5\' 4"   Weight 165 lbs 10 oz 163 lbs 163 lbs  BMI 28.41 27.97 27.97  Systolic 127 -- 114  Diastolic 70 -- 68  Pulse 88  88   Physical Exam  Constitutional: No distress.  Appears older than stated age, hemodynamically stable.   Neck: No JVD present.   Cardiovascular: Normal rate, regular rhythm, S1 normal, S2 normal and intact distal pulses. Exam reveals no gallop, no S3 and no S4.  Murmur heard. Holosystolic murmur is present with a grade of 3/6 at the apex radiating to the axilla. Pulses:      Femoral pulses are 2+ on the right side and 1+ on the left side. Pulmonary/Chest: Effort normal and breath sounds normal. No stridor. She has no wheezes. She has no rales.  BiV ICD noted left infraclavicular region.  Abdominal: Soft. Bowel sounds are normal. She exhibits no distension. There is no abdominal tenderness.  Musculoskeletal:        General: No edema.     Cervical back: Neck supple.  Neurological: She is alert and oriented to person, place, and time. She has intact cranial nerves (2-12).  Skin: Skin is warm and moist.   CARDIAC DATABASE: EKG: 08/12/2019: Normal sinus rhythm with ventricular rate of 61 bpm, left axis deviation, left bundle branch block, nonspecific T wave abnormalities.   01/19/2023: Atrial and ventricular paced rhythm.  Echocardiogram: 12/20/2021: Left ventricle cavity is normal in size. Normal left ventricular wall thickness. Doppler evidence of grade I (impaired) diastolic dysfunction, normal LAP. Left ventricle regional wall motion findings: Global hypokinesis. Cannot exclude inferior akinesis.  Moderately depressed LV systolic function with visual EF 35-40%.  Left atrial cavity is mildly dilated. Right ventricle cavity is normal in size. Right ventricle pacemaker lead wires visualized. Normal right ventricular function. Mild (Grade I) mitral regurgitation. Mild tricuspid regurgitation. No evidence of pulmonary hypertension. Compared to the study done on 02/28/2020, LVEF has again marginally improved from 30-35% to a percent 35 to 40%, also in 2021, patient's LVEF was 25 to 30%. Inferior wall motion abnormality is new.  Heart Catheterization: 08/04/19:  LV: Global hypokinesis, upper limit of normal size, EF 25 to  30%. Left main: Normal. LAD: Mild diffuse disease.  Proximal LAD has a 30 to 40% stenosis, mid segment has a 20 to 30% stenosis, scattered disease noted in the LAD.  Brisk flow. Circumflex: Again scattered disease noted in the circumflex.  Mid segment has at most a 30 to 40% stenosis which appears to be eccentric and calcified. RCA: Dominant.  Mild diffuse disease again noted.  Mid segment after the origin of RV branch has a 70 to 80% stenosis.  Brisk flow is evident throughout  the RCA.  Impression: Findings consistent with nonischemic cardiomyopathy.  Although she has significant disease in the right coronary artery, this does not explain her presentation with global hypokinesis, neither does it explain VF arrest as the lesion does not appear to be unstable.   Scheduled  In office ICD 08/07/22  Single (S)/Dual (D)/BV (M) B Presenting APBP Pacer dependant: No Underlying ASVS @ 70/min, . AP 97%,  BP(LV) 98.6.% AT/AF burden <0.1%. Brief AT/AF. HVR 0.   Longevity 5.1 Years/Voltage.  Lead measurements: Stable Histogram: Low(L)/normal (N)/high (H)  Flat. Patient activity Low Thoracic impedance: Normal  Observations: Normal ICD function.  Changes: ADL increased from 95 to 105 to improve histogram, however her histogram shows low activity levels.  Changed V output from 2.5 at 0.40 mS to 2V at 0.4 mSec on RV and LV output left at 2 V and  pulse width form 0.6 to 0.8 mS  Remote CRT-D transmission 01/02/2023: Longevity 3 years and 7 months.  AP 98 %, LVP 96 %.  Lead impedance and thresholds are normal.  Brief ventricular and atrial monitoring episodes, conducted sinus rhythm and PAF episodes, very brief, longest 1 hour 36 minutes otherwise <2 minutes.   AT/AF burden <0.4%  Thoracic impedance is at baseline and does not suggest volume overload state.  Normal and stable constipation.  Carotid duplex: 08/03/2019: Right Carotid: Velocities in the right ICA are consistent with a 1-39% stenosis. Left  Carotid: Velocities in the left ICA are consistent with a 1-39%  stenosis. Vertebrals:  Bilateral vertebral arteries demonstrate antegrade flow. Subclavians: Normal flow hemodynamics were seen in bilateral subclavian  arteries.  LABORATORY DATA:    Latest Ref Rng & Units 07/30/2020    5:04 PM 06/12/2020    2:56 AM 04/23/2020   12:52 PM  CBC  WBC 4.0 - 10.5 K/uL 6.0  6.0  5.4   Hemoglobin 12.0 - 15.0 g/dL 84.1  32.4  40.1   Hematocrit 36.0 - 46.0 % 34.5  32.4  32.8   Platelets 150 - 400 K/uL 196  173  223.0        Latest Ref Rng & Units 05/01/2022   12:36 PM 09/27/2021    8:56 AM 10/25/2020    3:21 PM  CMP  Glucose 70 - 99 mg/dL 88  87  89   BUN 6 - 23 mg/dL 18  21  29    Creatinine 0.40 - 1.20 mg/dL 0.27  2.53  6.64   Sodium 135 - 145 mEq/L 139  140  142   Potassium 3.5 - 5.1 mEq/L 3.9  4.2  4.3   Chloride 96 - 112 mEq/L 101  102  104   CO2 19 - 32 mEq/L 32  29  28   Calcium 8.4 - 10.5 mg/dL 9.2  9.2  9.1   Total Protein 6.0 - 8.3 g/dL 7.4  7.1    Total Bilirubin 0.2 - 1.2 mg/dL 0.5  0.5    Alkaline Phos 39 - 117 U/L 62  57    AST 0 - 37 U/L 25  21    ALT 0 - 35 U/L 13  8      Lipid Panel  Lab Results  Component Value Date   CHOL 129 05/01/2022   HDL 44.50 05/01/2022   LDLCALC 59 05/01/2022   LDLDIRECT 193.0 09/02/2016   TRIG 131.0 05/01/2022   CHOLHDL 3 05/01/2022    Lab Results  Component Value Date   HGBA1C 5.7 (H) 08/03/2019   No  components found for: "NTPROBNP" Lab Results  Component Value Date   TSH 1.487 12/07/2019   TSH 0.550 08/24/2019   TSH 0.75 08/23/2019   External Labs: Collected: 05/28/2021 at [facility] BUN 27, creatinine 2.29 mg/dL Sodium 244, potassium 4.2, chloride 104, bicarb 30 Total cholesterol 136 HDL 44, LDL 62,  triglycerides 179 Hemoglobin 11.6 g/dL.  FINAL MEDICATION LIST END OF ENCOUNTER:   Current Outpatient Medications:    acetaminophen (TYLENOL) 500 MG tablet, Take 1,000 mg by mouth 2 (two) times daily as needed (pain)., Disp:  , Rfl:    aspirin 81 MG chewable tablet, Chew 1 tablet (81 mg total) by mouth daily., Disp: 30 tablet, Rfl: 0   carvedilol (COREG) 3.125 MG tablet, TAKE 1 TABLET BY MOUTH TWICE A DAY WITH FOOD, Disp: 180 tablet, Rfl: 1   Cholecalciferol (VITAMIN D3 PO), Take 1,000 Units by mouth in the morning and at bedtime., Disp: , Rfl:    diphenhydrAMINE (BENADRYL CHILDRENS ALLERGY) 12.5 MG/5ML liquid, Take 25 mg by mouth at bedtime., Disp: , Rfl:    ELIQUIS 5 MG TABS tablet, TAKE 1 TABLET BY MOUTH TWICE A DAY, Disp: 180 tablet, Rfl: 1   ezetimibe (ZETIA) 10 MG tablet, TAKE 1 TABLET BY MOUTH EVERY DAY, Disp: 90 tablet, Rfl: 1   famotidine (PEPCID) 20 MG tablet, TAKE 1 TABLET BY MOUTH TWICE A DAY (Patient taking differently: Take 20 mg by mouth daily.), Disp: 180 tablet, Rfl: 1   furosemide (LASIX) 20 MG tablet, TAKE 1 TABLET BY MOUTH TWICE A DAY, Disp: 180 tablet, Rfl: 1   Magnesium 400 MG TABS, Take 400 mg by mouth 2 (two) times daily. , Disp: , Rfl:    rosuvastatin (CRESTOR) 10 MG tablet, TAKE 1 TABLET (10 MG TOTAL) BY MOUTH DAILY AT 6 PM., Disp: 90 tablet, Rfl: 1   sertraline (ZOLOFT) 50 MG tablet, TAKE 1 TABLET BY MOUTH EVERYDAY AT BEDTIME, Disp: 90 tablet, Rfl: 1   vitamin B-12 (CYANOCOBALAMIN) 1000 MCG tablet, Take 1,000 mcg by mouth daily., Disp: , Rfl:   IMPRESSION:    ICD-10-CM   1. HFrEF (heart failure with reduced ejection fraction) (HCC)  I50.20 EKG 12-Lead    PCV ECHOCARDIOGRAM COMPLETE    Comp. Metabolic Panel (12)    Pro b natriuretic peptide (BNP)    2. Biventricular ICD (implantable cardioverter-defibrillator) in place  Z95.810     3. Nonischemic cardiomyopathy (HCC)  I42.8     4. Left bundle branch block  I44.7     5. History of cardiac arrest  Z86.74     6. History of torsades de pointes  Z86.79     7. Paroxysmal atrial fibrillation (HCC)  I48.0 Hemoglobin and hematocrit, blood    8. Long term current use of anticoagulant  Z79.01 Hemoglobin and hematocrit, blood    9. Mixed  hyperlipidemia  E78.2     10. History of CVA (cerebrovascular accident)  Z29.73     11. Mild atherosclerosis of both carotid arteries  I65.23     12. Former smoker  Z87.891        RECOMMENDATIONS: [name] is a 72 y.o. female whose past medical history and cardiac risk factors include: Hypertension, hyperlipidemia, former smoker, history of Arnold-Chiari malformation, history of VT/VF, history of torsades, cardiac arrest, nonischemic cardiomyopathy, peripheral vascular disease, paroxysmal atrial fibrillation, left bundle branch block, history of CVA.  Chronic HFrEF (heart failure with reduced ejection fraction) (HCC) Cardiomyopathy, suspect nonischemic (HCC) Stage C, NYHA class II Euvolemic. Last hospitalization:  February 2022 for heart failure. Clinically she remains stable on current medical therapy.  As noted above uptitration of GDMT has been difficult due to chronic kidney disease, hyperkalemia, and soft blood pressures in the past. Will request records from [facility] as she recently had labs to reevaluate her serum creatinine. Echo will be ordered prior to next office visit to reevaluate LVEF. Will check labs prior to next office visit. Patient stated that her nephrologist recommends probably down titration of diuretics. For now I have asked her to keep a log of her blood pressures and weight and this could be considered at the next office visit based on her LVEF and labs. Most recent device interrogation report reviewed  Biventricular ICD (implantable cardioverter-defibrillator) in place Most recent device interrogation report reviewed. She is aware that she has paroxysmal episodes of A-fib.  Currently on medical therapy.  Paroxysmal atrial fibrillation (HCC) Rate control: Carvedilol. Rhythm control: N/A. Thromboembolic prophylaxis: Eliquis Reemphasized the risks, benefits, and alternatives to oral anticoagulation. CHA2DS2-VASc SCORE is 7 which correlates to 9.6%  risk of stroke per year (CHF, HTN, age, history of stroke, PVD, gender).   Essential hypertension Chronic kidney disease, stage IV (severe) (HCC) Office blood pressures are within acceptable limits. I have asked her to keep a log of her blood pressures and to review with either myself or PCP. Labs from nephrology requested.   . Carotid artery atherosclerosis, bilateral: History of CVA Prior history of bilateral carotid artery atherosclerosis as of 2021. Will repeat carotid duplex to reevaluate disease progression  Orders Placed This Encounter  Procedures   Comp. Metabolic Panel (12)   Pro b natriuretic peptide (BNP)   Hemoglobin and hematocrit, blood   EKG 12-Lead   PCV ECHOCARDIOGRAM COMPLETE   --Continue cardiac medications as reconciled in final medication list. --Return in about 3 months (around 04/21/2023) for Follow up, heart failure management... Or sooner if needed. --Continue follow-up with your primary care physician regarding the management of your other chronic comorbid conditions.  Patient's questions and concerns were addressed to her and her daughter's satisfaction. She and her daughter voices understanding of the instructions provided during this encounter.   This note was created using a voice recognition software as a result there may be grammatical errors inadvertently enclosed that do not reflect the nature of this encounter. Every attempt is made to correct such errors.  Tessa Lerner, [place], [facility]  Pager:  [phone] Office: [phone]

## 2023-01-27 NOTE — Progress Notes (Signed)
Called patient no answer, left a vm

## 2023-01-29 NOTE — Progress Notes (Signed)
Called and spoke with patient daughter regarding her CAD results.

## 2023-02-19 DIAGNOSIS — Z4502 Encounter for adjustment and management of automatic implantable cardiac defibrillator: Secondary | ICD-10-CM | POA: Diagnosis not present

## 2023-02-19 DIAGNOSIS — I5022 Chronic systolic (congestive) heart failure: Secondary | ICD-10-CM | POA: Diagnosis not present

## 2023-02-19 DIAGNOSIS — I4901 Ventricular fibrillation: Secondary | ICD-10-CM | POA: Diagnosis not present

## 2023-02-19 DIAGNOSIS — Z95810 Presence of automatic (implantable) cardiac defibrillator: Secondary | ICD-10-CM | POA: Diagnosis not present

## 2023-03-05 ENCOUNTER — Other Ambulatory Visit: Payer: Self-pay

## 2023-03-05 ENCOUNTER — Encounter: Payer: Self-pay | Admitting: Cardiology

## 2023-03-05 DIAGNOSIS — I5020 Unspecified systolic (congestive) heart failure: Secondary | ICD-10-CM

## 2023-03-10 ENCOUNTER — Other Ambulatory Visit: Payer: Self-pay | Admitting: Family Medicine

## 2023-03-10 NOTE — Telephone Encounter (Signed)
Spoke with patient daughter, she will cb to get her scheduled.

## 2023-03-10 NOTE — Telephone Encounter (Signed)
Please call and schedule CPE with fasting labs prior with Dr. Ermalene Searing.

## 2023-03-14 ENCOUNTER — Other Ambulatory Visit: Payer: Self-pay | Admitting: Cardiology

## 2023-03-14 DIAGNOSIS — Z7901 Long term (current) use of anticoagulants: Secondary | ICD-10-CM

## 2023-03-14 DIAGNOSIS — I480 Paroxysmal atrial fibrillation: Secondary | ICD-10-CM

## 2023-03-16 NOTE — Telephone Encounter (Signed)
Prescription refill request for Eliquis received. Indication:afib Last office visit:8/24 Scr:2.15  2/24 Age: 72 Weight:75.1  kg  Prescription refilled

## 2023-04-03 DIAGNOSIS — I4901 Ventricular fibrillation: Secondary | ICD-10-CM | POA: Diagnosis not present

## 2023-04-03 DIAGNOSIS — Z4502 Encounter for adjustment and management of automatic implantable cardiac defibrillator: Secondary | ICD-10-CM | POA: Diagnosis not present

## 2023-04-03 DIAGNOSIS — I5022 Chronic systolic (congestive) heart failure: Secondary | ICD-10-CM | POA: Diagnosis not present

## 2023-04-05 ENCOUNTER — Other Ambulatory Visit: Payer: Self-pay | Admitting: Cardiology

## 2023-04-11 LAB — PRO B NATRIURETIC PEPTIDE: NT-Pro BNP: 520 pg/mL — ABNORMAL HIGH (ref 0–301)

## 2023-04-11 LAB — COMP. METABOLIC PANEL (12)
AST: 18 [IU]/L (ref 0–40)
Albumin: 4.2 g/dL (ref 3.8–4.8)
Alkaline Phosphatase: 64 [IU]/L (ref 44–121)
BUN/Creatinine Ratio: 8 — ABNORMAL LOW (ref 12–28)
BUN: 16 mg/dL (ref 8–27)
Bilirubin Total: 0.3 mg/dL (ref 0.0–1.2)
Calcium: 8.9 mg/dL (ref 8.7–10.3)
Chloride: 105 mmol/L (ref 96–106)
Creatinine, Ser: 1.89 mg/dL — ABNORMAL HIGH (ref 0.57–1.00)
Globulin, Total: 2.6 g/dL (ref 1.5–4.5)
Glucose: 135 mg/dL — ABNORMAL HIGH (ref 70–99)
Potassium: 3.7 mmol/L (ref 3.5–5.2)
Sodium: 141 mmol/L (ref 134–144)
Total Protein: 6.8 g/dL (ref 6.0–8.5)
eGFR: 28 mL/min/{1.73_m2} — ABNORMAL LOW (ref >59)

## 2023-04-20 NOTE — Progress Notes (Signed)
[facility] - Clinic Note  04/27/2023     CHIEF COMPLAINT Patient presents for Retina Follow Up   HISTORY OF PRESENT ILLNESS: [name] is a 72 y.o. female who presents to the clinic today for:   HPI     Retina Follow Up   In both eyes.  This started 5.5 months ago.  Duration of 5.5 months.  Since onset it is stable.  I, the attending physician,  performed the HPI with the patient and updated documentation appropriately.        Comments   5.5 month retina follow up retinoschisis ou pt is reporting no vision changes noticed she denies any flashes or floaters she had cat sx in August Dr Dione Booze       Last edited by Rennis Chris, MD on 04/27/2023 10:30 PM.    Patient states the vision has improved with cataract surgery in both eyes.   Referring physician: Excell Seltzer, MD [address] [place],  [place] [address]  HISTORICAL INFORMATION:   Selected notes from the MEDICAL RECORD NUMBER Referred by Dr. Council Mechanic for retinoschisis OU LEE: 08.29.19 (K. Hallahan) [BCVA: OD: 20/20 OS: 20/20-] Ocular Hx-cataracts OU, glaucoma suspect OU PMH-anxiety, HTN, current smoker    CURRENT MEDICATIONS: No current outpatient medications on file. (Ophthalmic Drugs)   No current facility-administered medications for this visit. (Ophthalmic Drugs)   Current Outpatient Medications (Other)  Medication Sig   acetaminophen (TYLENOL) 500 MG tablet Take 1,000 mg by mouth 2 (two) times daily as needed (pain).   aspirin 81 MG chewable tablet Chew 1 tablet (81 mg total) by mouth daily.   carvedilol (COREG) 3.125 MG tablet TAKE 1 TABLET BY MOUTH TWICE A DAY WITH FOOD   Cholecalciferol (VITAMIN D3 PO) Take 1,000 Units by mouth in the morning and at bedtime.   diphenhydrAMINE (BENADRYL CHILDRENS ALLERGY) 12.5 MG/5ML liquid Take 25 mg by mouth at bedtime.   ELIQUIS 5 MG TABS tablet TAKE 1 TABLET BY MOUTH TWICE A DAY   ezetimibe (ZETIA) 10 MG tablet TAKE 1 TABLET  BY MOUTH EVERY DAY   famotidine (PEPCID) 20 MG tablet TAKE 1 TABLET BY MOUTH TWICE A DAY (Patient taking differently: Take 20 mg by mouth daily.)   furosemide (LASIX) 20 MG tablet TAKE 1 TABLET BY MOUTH TWICE A DAY   Magnesium 400 MG TABS Take 400 mg by mouth 2 (two) times daily.    rosuvastatin (CRESTOR) 10 MG tablet TAKE 1 TABLET (10 MG TOTAL) BY MOUTH DAILY AT 6 PM.   sertraline (ZOLOFT) 50 MG tablet TAKE 1 TABLET BY MOUTH EVERYDAY AT BEDTIME   vitamin B-12 (CYANOCOBALAMIN) 1000 MCG tablet Take 1,000 mcg by mouth daily.   No current facility-administered medications for this visit. (Other)   REVIEW OF SYSTEMS: ROS   Positive for: Gastrointestinal, Neurological, Musculoskeletal, Cardiovascular, Eyes Negative for: Constitutional, Skin, Genitourinary, HENT, Endocrine, Respiratory, Psychiatric, Allergic/Imm, Heme/Lymph Last edited by Etheleen Mayhew, COT on 04/27/2023  1:39 PM.      ALLERGIES Allergies  Allergen Reactions   Shellfish-Derived Products Anaphylaxis   Ativan [Lorazepam] Other (See Comments)    Makes "skin crawl" and insomnia   Buspar [Buspirone] Nausea Only    Sweating and dizzy   Clarithromycin Swelling   Codeine Nausea And Vomiting   Doxycycline Other (See Comments)    Unknown   Guaifenesin Other (See Comments)    Palpitations   Oxycodone Nausea And Vomiting   Prednisone Nausea And Vomiting  and Other (See Comments)    Makes my heart race   Statins Other (See Comments)    Muscle pain   Sulfonamide Derivatives Nausea And Vomiting    Achiness   Tetracycline Nausea Only   Vicodin [Hydrocodone-Acetaminophen] Nausea And Vomiting   Famotidine Rash   Latex Rash   Omeprazole Nausea Only    Cough, shortness of breath - patient doesn't remember   Peanut-Containing Drug Products Itching and Rash   Protonix [Pantoprazole] Rash   Wellbutrin [Bupropion] Palpitations   PAST MEDICAL HISTORY Past Medical History:  Diagnosis Date   Allergy    Anxiety     Arnold-Chiari malformation (HCC)    Arthritis    CHF (congestive heart failure) (HCC)    Chronic systolic heart failure (HCC) 11/03/2019   Coronary artery disease    Dyspnea    Encounter for assessment of implantable cardioverter-defibrillator (ICD) 11/10/2019   GERD (gastroesophageal reflux disease)    H. pylori infection    3 years ago   H/O cardiac arrest    Hx of VT/Vfib arrest   Hyperlipidemia    ICD BiV Medtronic model [id number] Claria Quad CRT-D SureScan ICD 08/25/2019    Incontinence    Paroxysmal atrial fibrillation (HCC) 08/06/2019   Pulmonary edema 2021   Syncope and collapse    Per pt, denies passing out   Tobacco use disorder    Unspecified essential hypertension    Past Surgical History:  Procedure Laterality Date   APPENDECTOMY     ARNOLD CHIARI SURGERY     neurocranial surgery   BIV ICD INSERTION CRT-D N/A 08/25/2019   Procedure: BIV ICD INSERTION CRT-D;  Surgeon: Regan Lemming, MD;  [facility] INVASIVE CV LAB;  Service: Cardiovascular;  Laterality: N/A;   BLEPHAROPLASTY     Bil   C-EYE SURGERY PROCEDURE     CARDIAC CATHETERIZATION     CATARACT EXTRACTION Bilateral    CHOLECYSTECTOMY     EYE SURGERY     heart failure     LEFT HEART CATH AND CORONARY ANGIOGRAPHY N/A 08/04/2019   Procedure: LEFT HEART CATH AND CORONARY ANGIOGRAPHY;  Surgeon: Yates Decamp, MD;  Location: [facility] INVASIVE CV LAB;  Service: Cardiovascular;  Laterality: N/A;   MASS EXCISION Right 12/27/2013   Procedure: MINOR EXCISION OF RIGHT THUMB MUCOID CYST, DEBRIDEMENT OF INTERPHALANGEAL JOINT;  Surgeon: Wyn Forster, MD;  Location: [facility];  Service: Orthopedics;  Laterality: Right;   mass on thumb  right   TOTAL KNEE ARTHROPLASTY Right 02/17/2017   TOTAL KNEE ARTHROPLASTY Right 02/17/2017   Procedure: TOTAL KNEE ARTHROPLASTY;  Surgeon: Marcene Corning, MD;  Location: [facility] OR;  Service: Orthopedics;  Laterality: Right;   TUBAL LIGATION     FAMILY HISTORY Family  History  Problem Relation Age of Onset   Bone cancer Mother    Heart disease Mother    Cancer Sister        metastatic; unknown primary   Diverticulosis Sister    Tuberculosis Father    Hypertension Sister    Colon cancer Neg Hx    Esophageal cancer Neg Hx    Pancreatic cancer Neg Hx    Stomach cancer Neg Hx    Liver disease Neg Hx    Kidney disease Neg Hx    Rectal cancer Neg Hx    Pancreatic disease Neg Hx    SOCIAL HISTORY Social History   Tobacco Use   Smoking status: Former    Current packs/day: 0.00  Average packs/day: 1 pack/day for 50.0 years (50.0 ttl pk-yrs)    Types: Cigarettes    Start date: 08/07/1969    Quit date: 08/08/2019    Years since quitting: 3.7   Smokeless tobacco: Never  Vaping Use   Vaping status: Never Used  Substance Use Topics   Alcohol use: No    Alcohol/week: 0.0 standard drinks of alcohol   Drug use: No       OPHTHALMIC EXAM:  Base Eye Exam     Visual Acuity (Snellen - Linear)       Right Left   Dist [place] 20/30 -2 20/25   Dist ph [place] 20/20 20/20         Tonometry (Tonopen, 1:44 PM)       Right Left   Pressure 15 17         Pupils       Pupils Dark Light Shape React APD   Right PERRL 4 3 Round Brisk None   Left PERRL 4 3 Round Brisk None         Visual Fields       Left Right    Full Full         Extraocular Movement       Right Left    Full, Ortho Full, Ortho         Neuro/Psych     Oriented x3: Yes   Mood/Affect: Normal         Dilation     Both eyes: 2.5% Phenylephrine @ 1:45 PM           Slit Lamp and Fundus Exam     Slit Lamp Exam       Right Left   Lids/Lashes Dermatochalasis - upper lid Dermatochalasis - upper lid, Meibomian gland dysfunction   Conjunctiva/Sclera Nasal and Temporal Pinguecula Nasal and Temporal Pinguecula   Cornea Arcus, 1+ Punctate epithelial erosions, Debris in tear film, Well healed temporal cataract wound Arcus, 2+ Punctate epithelial erosions   Anterior  Chamber deep, clear, no cell/flare deep, clear, no cell/flare   Iris Round and dilated Round and dilated   Lens PC IOL in good postition PC IOL in good postition   Anterior Vitreous Vitreous syneresis Vitreous syneresis, Posterior vitreous detachment         Fundus Exam       Right Left   Disc Pink and Sharp Pink and Sharp, mild central Pallor, +cupping   C/D Ratio 0.6 0.7   Macula blunted foveal reflex, scattered MA, no edema, +druplets, RPE mottling and clumping, trace central cystic changes Flat, blunted foveal reflex, Retinal pigment epithelial mottling and clumping, +drusen, No heme or edema   Vessels attenuated, Tortuous attenuated, Tortuous   Periphery Attached, bullous retinoschisis cavity inferiorly from 0600 to 0730 - good laser surrounding, inferior reticular degeneration, No RT/RD on 360 exam Attached, bullous retinoschisis cavity from 0430 to 0600 -- good laser surrounding, reticular degeneration, RPE mottling, No new RT/RD            IMAGING AND PROCEDURES  Imaging and Procedures for @TODAY @  OCT, Retina - OU - Both Eyes       Right Eye Quality was good. Central Foveal Thickness: 323. Progression has worsened. Findings include normal foveal contour, no SRF, retinal drusen (Focal cystic changes/IRF IN fovea, Inferotemporal retinoschisis with surrounding ORA (laser) caught on widefield OCT - stable).   Left Eye Quality was good. Central Foveal Thickness: 289. Progression has been stable. Findings include normal  foveal contour, no IRF, no SRF, retinal drusen (Inferotemporal retinoschisis caught on widefield OCT -- not imaged today).   Notes *Images captured and stored on drive  Diagnosis / Impression:  OD: Focal cystic changes/IRF IN fovea, Inferotemporal retinoschisis with surrounding ORA (laser) caught on widefield OCT - stable Retinal drusen OU   Clinical management:  See below  Abbreviations: NFP - Normal foveal profile. CME - cystoid macular edema. PED -  pigment epithelial detachment. IRF - intraretinal fluid. SRF - subretinal fluid. EZ - ellipsoid zone. ERM - epiretinal membrane. ORA - outer retinal atrophy. ORT - outer retinal tubulation. 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 Essential hypertension  I10     5. Hypertensive retinopathy of both eyes  H35.033     6. Pseudophakia  Z96.1     7. Dry eyes, bilateral  H04.123      1. Retinoschisis OU-   - bullous, inferotemporal retinoschisis cavities OU (OD > OS)   - schisis confirmed by widefield OCT -- stable  - no retinal tears or holes on depressed exam - fairly large cavity with significant posterior extension -- notable in comparison to 02/2018 baseline images  - s/p laser retinopexy OD (04.08.24) -- good laser surrounding - s/p laser retinopexy OS (04.22.24) -- good laser surrounding  - F/U 2-3 months, sooner prn  2. Retinal drusen OU  - mild OCT findings -- stable from prior  - monitor  3. Hx of refractile arteriolar plaque superiorly OD - located at 1 o'clock midzone at arteriolar bifurcation -- not present today ?resolved  - good distal perfusion   - monitor  4,5. Hypertensive retinopathy OU  - discussed importance of tight BP control  - monitor  6. Pseudophakia OU  - s/p CE/IOL OU in August 2024 with Dr. Laruth Bouchard  - IOLs in good position, doing well  - BCVA 20/20 OU  - OCT OD with mild cystic changes -- ?early CME  - monitor -- f/u in 3 mos  7. Dry eyes OU - recommend artificial tears and lubricating ointment as needed   Ophthalmic Meds Ordered this visit:  No orders of the defined types were placed in this encounter.    Return in about 3 months (around 07/28/2023) for f/u schisis , DFE, OCT.  There are no Patient Instructions on file  for this visit.   Explained the diagnoses, plan, and follow up with the patient and they expressed understanding.  Patient expressed understanding of the importance of proper follow up care.    This document serves as a record of services personally performed by Karie Chimera, MD, PhD. It was created on their behalf by Glee Arvin. Manson Passey, OA an ophthalmic technician. The creation of this record is the provider's dictation and/or activities during the visit.    Electronically signed by: Glee Arvin. Manson Passey, OA 04/27/23 10:32 PM  This document serves as a record of services personally performed by Karie Chimera, MD, PhD. It was created on their behalf by Charlette Caffey, COT an ophthalmic technician. The creation of this record is the provider's dictation and/or activities during the visit.    Electronically  signed by:  Charlette Caffey, COT  04/27/23 10:32 PM  Karie Chimera, M.D., Ph.D. Diseases & Surgery of the Retina and Vitreous [facility]  I have reviewed the above documentation for accuracy and completeness, and I agree with the above. Karie Chimera, M.D., Ph.D. 04/27/23 10:33 PM   Abbreviations: M myopia (nearsighted); A astigmatism; H hyperopia (farsighted); P presbyopia; Mrx spectacle prescription;  CTL contact lenses; OD right eye; OS left eye; OU both eyes  XT exotropia; ET esotropia; PEK punctate epithelial keratitis; PEE punctate epithelial erosions; DES dry eye syndrome; MGD meibomian gland dysfunction; ATs artificial tears; PFAT's preservative free artificial tears; NSC nuclear sclerotic cataract; PSC posterior subcapsular cataract; ERM epi-retinal membrane; PVD posterior vitreous detachment; RD retinal detachment; DM diabetes mellitus; DR diabetic retinopathy; NPDR non-proliferative diabetic retinopathy; PDR proliferative diabetic retinopathy; CSME clinically significant macular edema; DME diabetic macular edema; dbh dot blot hemorrhages; CWS cotton  wool spot; POAG primary open angle glaucoma; C/D cup-to-disc ratio; HVF humphrey visual field; GVF goldmann visual field; OCT optical coherence tomography; IOP intraocular pressure; BRVO Branch retinal vein occlusion; CRVO central retinal vein occlusion; CRAO central retinal artery occlusion; BRAO branch retinal artery occlusion; RT retinal tear; SB scleral buckle; PPV pars plana vitrectomy; VH Vitreous hemorrhage; PRP panretinal laser photocoagulation; IVK intravitreal kenalog; VMT vitreomacular traction; MH Macular hole;  NVD neovascularization of the disc; NVE neovascularization elsewhere; AREDS age related eye disease study; ARMD age related macular degeneration; POAG primary open angle glaucoma; EBMD epithelial/anterior basement membrane dystrophy; ACIOL anterior chamber intraocular lens; IOL intraocular lens; PCIOL posterior chamber intraocular lens; Phaco/IOL phacoemulsification with intraocular lens placement; PRK photorefractive keratectomy; LASIK laser assisted in situ keratomileusis; HTN hypertension; DM diabetes mellitus; COPD chronic obstructive pulmonary disease

## 2023-04-22 ENCOUNTER — Ambulatory Visit (INDEPENDENT_AMBULATORY_CARE_PROVIDER_SITE_OTHER): Payer: Medicare HMO

## 2023-04-22 DIAGNOSIS — I428 Other cardiomyopathies: Secondary | ICD-10-CM

## 2023-04-27 ENCOUNTER — Encounter (INDEPENDENT_AMBULATORY_CARE_PROVIDER_SITE_OTHER): Payer: Self-pay | Admitting: Ophthalmology

## 2023-04-27 ENCOUNTER — Ambulatory Visit (INDEPENDENT_AMBULATORY_CARE_PROVIDER_SITE_OTHER): Payer: Medicare HMO | Admitting: Ophthalmology

## 2023-04-27 DIAGNOSIS — H35363 Drusen (degenerative) of macula, bilateral: Secondary | ICD-10-CM

## 2023-04-27 DIAGNOSIS — I10 Essential (primary) hypertension: Secondary | ICD-10-CM

## 2023-04-27 DIAGNOSIS — H33103 Unspecified retinoschisis, bilateral: Secondary | ICD-10-CM | POA: Diagnosis not present

## 2023-04-27 DIAGNOSIS — H35033 Hypertensive retinopathy, bilateral: Secondary | ICD-10-CM

## 2023-04-27 DIAGNOSIS — H3509 Other intraretinal microvascular abnormalities: Secondary | ICD-10-CM

## 2023-04-27 DIAGNOSIS — Z961 Presence of intraocular lens: Secondary | ICD-10-CM

## 2023-04-27 DIAGNOSIS — H25813 Combined forms of age-related cataract, bilateral: Secondary | ICD-10-CM

## 2023-04-27 DIAGNOSIS — I708 Atherosclerosis of other arteries: Secondary | ICD-10-CM

## 2023-04-27 DIAGNOSIS — H04123 Dry eye syndrome of bilateral lacrimal glands: Secondary | ICD-10-CM

## 2023-05-08 ENCOUNTER — Other Ambulatory Visit: Payer: Self-pay | Admitting: Gastroenterology

## 2023-05-08 LAB — CUP PACEART REMOTE DEVICE CHECK
Battery Remaining Longevity: 37 mo
Battery Voltage: 2.97 V
Brady Statistic AP VP Percent: 97.21 %
Brady Statistic AP VS Percent: 1.07 %
Brady Statistic AS VP Percent: 1.69 %
Brady Statistic AS VS Percent: 0.03 %
Brady Statistic RA Percent Paced: 98.13 %
Brady Statistic RV Percent Paced: 58.25 %
Date Time Interrogation Session: 20241106012402
HighPow Impedance: 72 Ohm
Implantable Lead Connection Status: 753985
Implantable Lead Connection Status: 753985
Implantable Lead Connection Status: 753985
Implantable Lead Implant Date: 20210311
Implantable Lead Implant Date: 20210311
Implantable Lead Implant Date: 20210311
Implantable Lead Location: 753858
Implantable Lead Location: 753859
Implantable Lead Location: 753860
Implantable Lead Model: 4598
Implantable Lead Model: 5076
Implantable Pulse Generator Implant Date: 20210311
Lead Channel Impedance Value: 237.5 Ohm
Lead Channel Impedance Value: 246.635
Lead Channel Impedance Value: 246.635
Lead Channel Impedance Value: 250.943
Lead Channel Impedance Value: 261.164
Lead Channel Impedance Value: 399 Ohm
Lead Channel Impedance Value: 456 Ohm
Lead Channel Impedance Value: 475 Ohm
Lead Channel Impedance Value: 475 Ohm
Lead Channel Impedance Value: 513 Ohm
Lead Channel Impedance Value: 532 Ohm
Lead Channel Impedance Value: 551 Ohm
Lead Channel Impedance Value: 665 Ohm
Lead Channel Impedance Value: 817 Ohm
Lead Channel Impedance Value: 836 Ohm
Lead Channel Impedance Value: 836 Ohm
Lead Channel Impedance Value: 874 Ohm
Lead Channel Impedance Value: 893 Ohm
Lead Channel Pacing Threshold Amplitude: 0.5 V
Lead Channel Pacing Threshold Amplitude: 0.625 V
Lead Channel Pacing Threshold Amplitude: 1.875 V
Lead Channel Pacing Threshold Pulse Width: 0.4 ms
Lead Channel Pacing Threshold Pulse Width: 0.4 ms
Lead Channel Pacing Threshold Pulse Width: 0.8 ms
Lead Channel Sensing Intrinsic Amplitude: 2 mV
Lead Channel Sensing Intrinsic Amplitude: 2 mV
Lead Channel Sensing Intrinsic Amplitude: 21.75 mV
Lead Channel Sensing Intrinsic Amplitude: 21.75 mV
Lead Channel Setting Pacing Amplitude: 1.5 V
Lead Channel Setting Pacing Amplitude: 2.5 V
Lead Channel Setting Pacing Amplitude: 3 V
Lead Channel Setting Pacing Pulse Width: 0.4 ms
Lead Channel Setting Pacing Pulse Width: 0.8 ms
Lead Channel Setting Sensing Sensitivity: 0.3 mV
Zone Setting Status: 755011
Zone Setting Status: 755011

## 2023-05-13 NOTE — Progress Notes (Signed)
Remote ICD transmission.   

## 2023-05-28 ENCOUNTER — Ambulatory Visit (HOSPITAL_COMMUNITY): Payer: Medicare HMO | Attending: Cardiology

## 2023-05-28 DIAGNOSIS — I5020 Unspecified systolic (congestive) heart failure: Secondary | ICD-10-CM | POA: Diagnosis not present

## 2023-05-28 LAB — ECHOCARDIOGRAM COMPLETE
Area-P 1/2: 2.87 cm2
S' Lateral: 3.7 cm

## 2023-06-01 ENCOUNTER — Other Ambulatory Visit: Payer: Self-pay | Admitting: Gastroenterology

## 2023-06-01 NOTE — Telephone Encounter (Signed)
Needs an appointment.

## 2023-06-05 ENCOUNTER — Ambulatory Visit: Payer: Medicare HMO | Attending: Cardiology | Admitting: Cardiology

## 2023-06-05 ENCOUNTER — Encounter: Payer: Self-pay | Admitting: Cardiology

## 2023-06-05 VITALS — BP 120/64 | HR 84 | Resp 16 | Ht 64.0 in | Wt 163.6 lb

## 2023-06-05 DIAGNOSIS — E782 Mixed hyperlipidemia: Secondary | ICD-10-CM | POA: Diagnosis not present

## 2023-06-05 DIAGNOSIS — Z8679 Personal history of other diseases of the circulatory system: Secondary | ICD-10-CM | POA: Diagnosis not present

## 2023-06-05 DIAGNOSIS — I480 Paroxysmal atrial fibrillation: Secondary | ICD-10-CM | POA: Diagnosis not present

## 2023-06-05 DIAGNOSIS — I447 Left bundle-branch block, unspecified: Secondary | ICD-10-CM

## 2023-06-05 DIAGNOSIS — Z8673 Personal history of transient ischemic attack (TIA), and cerebral infarction without residual deficits: Secondary | ICD-10-CM | POA: Diagnosis not present

## 2023-06-05 DIAGNOSIS — I5032 Chronic diastolic (congestive) heart failure: Secondary | ICD-10-CM

## 2023-06-05 DIAGNOSIS — Z8674 Personal history of sudden cardiac arrest: Secondary | ICD-10-CM | POA: Diagnosis not present

## 2023-06-05 DIAGNOSIS — Z87891 Personal history of nicotine dependence: Secondary | ICD-10-CM

## 2023-06-05 DIAGNOSIS — I428 Other cardiomyopathies: Secondary | ICD-10-CM

## 2023-06-05 DIAGNOSIS — Z95810 Presence of automatic (implantable) cardiac defibrillator: Secondary | ICD-10-CM

## 2023-06-05 DIAGNOSIS — Z7901 Long term (current) use of anticoagulants: Secondary | ICD-10-CM

## 2023-06-05 MED ORDER — APIXABAN 5 MG PO TABS: 5.0000 mg | ORAL_TABLET | Freq: Two times a day (BID) | ORAL | Status: DC

## 2023-06-05 NOTE — Progress Notes (Signed)
Cardiology Office Note:  .   Date:  06/05/2023  ID:  [name], DOB 06/11/1951, MRN [id number] PCP:  Excell Seltzer, MD  Former Cardiology Providers: None [facility] Providers Cardiologist:  Tessa Lerner, DO , [facility]  Electrophysiologist:  None  Click to update primary MD,subspecialty MD or APP then REFRESH:1}    Chief Complaint  Patient presents with   heart failure with reduced ejection fraction   Follow-up    History of Present Illness: .   [name] is a 72 y.o. Caucasian female whose past medical history and cardiovascular risk factors includes: Hypertension, hyperlipidemia, former smoker, history of Arnold-Chiari malformation, history of VT/VF, history of torsades, cardiac arrest, nonischemic cardiomyopathy & heart failure with improved EF, peripheral vascular disease, paroxysmal atrial fibrillation, left bundle branch block, history of CVA.   In 2021 she presented to the ED with V. tach/V-fib arrest due to torsades during the same hospitalization she was diagnosed with nonischemic cardiomyopathy and eventually went home with a LifeVest which successfully defibrillated her due to a V-fib event.  She subsequently underwent BiV ICD implant.  Her medications have been uptitrated in the past given her underlying nonischemic cardiomyopathy.  However at times due to chronic kidney disease and hyperkalemia GDMT had to be down scale.  However, over time she has done very well from a cardiovascular standpoint without any recent hospitalizations for congestive heart failure.  Since last office visit she had a repeat echocardiogram to reevaluate LVEF which has improved from 20-25% to 45-50%.  Since last office visit she denies any anginal chest pain or heart failure symptoms.   Labs from October 2024 note relatively stable renal function with EGFR consistent with stage IV kidney disease. Carotid duplex since last office visit noted no significant stenosis. Her last pacemaker  interrogation report reviewed.  Review of Systems: .   Review of Systems  Cardiovascular:  Negative for chest pain, claudication, irregular heartbeat, leg swelling, near-syncope, orthopnea, palpitations, paroxysmal nocturnal dyspnea and syncope.  Respiratory:  Negative for shortness of breath.   Hematologic/Lymphatic: Negative for bleeding problem.    Studies Reviewed:   EKG: EKG Interpretation Date/Time:  Friday June 05 2023 14:56:51 EST Ventricular Rate:  89 PR Interval:  166 QRS Duration:  140 QT Interval:  452 QTC Calculation: 549 R Axis:   262  Text Interpretation: AV dual-paced rhythm When compared with ECG of 30-Jul-2020 16:56, PREVIOUS ECG IS PRESENT Confirmed by Tessa Lerner [phone]) on 06/05/2023 3:05:04 PM  Echocardiogram: 02/28/2020: LVEF 25-30%  July 2023: LVEF 35-40%.  See report for additional details  December 2024 LVEF: 45-50% Diastolic Function: Grade 1 See report for additional details  Heart Catheterization: 08/04/19:  LV: Global hypokinesis, upper limit of normal size, EF 25 to 30%. Left main: Normal. LAD: Mild diffuse disease.  Proximal LAD has a 30 to 40% stenosis, mid segment has a 20 to 30% stenosis, scattered disease noted in the LAD.  Brisk flow. Circumflex: Again scattered disease noted in the circumflex.  Mid segment has at most a 30 to 40% stenosis which appears to be eccentric and calcified. RCA: Dominant.  Mild diffuse disease again noted.  Mid segment after the origin of RV branch has a 70 to 80% stenosis.  Brisk flow is evident throughout the RCA.  Impression: Findings consistent with nonischemic cardiomyopathy.  Although she has significant disease in the right coronary artery, this does not explain her presentation with global hypokinesis, neither does it explain VF arrest as the lesion  does not appear to be unstable.   Carotid artery duplex 01/12/2023: Duplex suggests stenosis in the right internal carotid artery (minimal). Duplex  suggests stenosis in the left internal carotid artery (minimal). Antegrade right vertebral artery flow. Antegrade left vertebral artery flow.    RADIOLOGY: N/A  Risk Assessment/Calculations:   Click Here to Calculate/Change CHADS2VASc Score The patient's CHADS2-VASc score is 7, indicating a 11.2% annual risk of stroke. CHF History: Yes HTN History: Yes Diabetes History: No Stroke History: Yes Vascular Disease History: Yes  Labs:       Latest Ref Rng & Units 07/30/2020    5:04 PM 06/12/2020    2:56 AM 04/23/2020   12:52 PM  CBC  WBC 4.0 - 10.5 K/uL 6.0  6.0  5.4   Hemoglobin 12.0 - 15.0 g/dL 64.4  03.4  74.2   Hematocrit 36.0 - 46.0 % 34.5  32.4  32.8   Platelets 150 - 400 K/uL 196  173  223.0        Latest Ref Rng & Units 04/10/2023    1:59 PM 05/01/2022   12:36 PM 09/27/2021    8:56 AM  BMP  Glucose 70 - 99 mg/dL 595  88  87   BUN 8 - 27 mg/dL 16  18  21    Creatinine 0.57 - 1.00 mg/dL 6.38  7.56  4.33   BUN/Creat Ratio 12 - 28 8     Sodium 134 - 144 mmol/L 141  139  140   Potassium 3.5 - 5.2 mmol/L 3.7  3.9  4.2   Chloride 96 - 106 mmol/L 105  101  102   CO2 19 - 32 mEq/L  32  29   Calcium 8.7 - 10.3 mg/dL 8.9  9.2  9.2       Latest Ref Rng & Units 04/10/2023    1:59 PM 05/01/2022   12:36 PM 09/27/2021    8:56 AM  CMP  Glucose 70 - 99 mg/dL 295  88  87   BUN 8 - 27 mg/dL 16  18  21    Creatinine 0.57 - 1.00 mg/dL 1.88  4.16  6.06   Sodium 134 - 144 mmol/L 141  139  140   Potassium 3.5 - 5.2 mmol/L 3.7  3.9  4.2   Chloride 96 - 106 mmol/L 105  101  102   CO2 19 - 32 mEq/L  32  29   Calcium 8.7 - 10.3 mg/dL 8.9  9.2  9.2   Total Protein 6.0 - 8.5 g/dL 6.8  7.4  7.1   Total Bilirubin 0.0 - 1.2 mg/dL 0.3  0.5  0.5   Alkaline Phos 44 - 121 IU/L 64  62  57   AST 0 - 40 IU/L 18  25  21    ALT 0 - 35 U/L  13  8     Lab Results  Component Value Date   CHOL 129 05/01/2022   HDL 44.50 05/01/2022   LDLCALC 59 05/01/2022   LDLDIRECT 193.0 09/02/2016   TRIG 131.0  05/01/2022   CHOLHDL 3 05/01/2022   No results for input(s): "LIPOA" in the last 8760 hours. No components found for: "NTPROBNP" Recent Labs    04/10/23 1358  PROBNP 520*   No results for input(s): "TSH" in the last 8760 hours.  Physical Exam:    Today's Vitals   06/05/23 1455  BP: 120/64  Pulse: 84  Resp: 16  SpO2: 95%  Weight: 163 lb 9.6 oz (74.2 kg)  Height: 5\' 4"  (1.626 m)   Body mass index is 28.08 kg/m. Wt Readings from Last 3 Encounters:  06/05/23 163 lb 9.6 oz (74.2 kg)  01/19/23 165 lb 9.6 oz (75.1 kg)  12/16/22 163 lb (73.9 kg)    Physical Exam  Constitutional: No distress.  Appears older than stated age, hemodynamically stable.   Neck: No JVD present.  Cardiovascular: Normal rate, regular rhythm, S1 normal, S2 normal and intact distal pulses. Exam reveals no gallop, no S3 and no S4.  Murmur heard. Holosystolic murmur is present with a grade of 3/6 at the apex radiating to the axilla. Pulses:      Femoral pulses are 2+ on the right side and 1+ on the left side. Pulmonary/Chest: Effort normal and breath sounds normal. No stridor. She has no wheezes. She has no rales.  BiV ICD noted left infraclavicular region.  Abdominal: Soft. Bowel sounds are normal. She exhibits no distension. There is no abdominal tenderness.  Musculoskeletal:        General: No edema.     Cervical back: Neck supple.  Neurological: She is alert and oriented to person, place, and time. She has intact cranial nerves (2-12).  Skin: Skin is warm and moist.     Impression & Recommendation(s):  Impression:   ICD-10-CM   1. Heart failure with improved ejection fraction (HFimpEF) (HCC)  I50.32 EKG 12-Lead    2. Nonischemic cardiomyopathy (HCC)  I42.8     3. Biventricular ICD (implantable cardioverter-defibrillator) in place  Z95.810     4. Left bundle branch block  I44.7     5. History of cardiac arrest  Z86.74     6. History of torsades de pointes  Z86.79     7. Paroxysmal atrial  fibrillation (HCC)  I48.0     8. Long term current use of anticoagulant  Z79.01     9. Mixed hyperlipidemia  E78.2     10. History of CVA (cerebrovascular accident)  Z51.73     11. Former smoker  Z87.891        Recommendation(s):  Heart failure with improved ejection fraction (HFimpEF) (HCC) Nonischemic cardiomyopathy (HCC) Biventricular ICD (implantable cardioverter-defibrillator) in place Stage C, NYHA class II Euvolemic on physical examination. Last hospitalization for congestive heart failure in February 2022. She remains stable on the limited GDMT that she can tolerate given her chronic disease stage IV. Labs from October 2024 independently reviewed which note some improvement in renal function and NT proBNP is also trended down.. Echocardiogram from December 2024 notes improvement in LVEF to 45-50%. Patient states that nephrology is requesting down titration of diuretic therapy.  I think this is worth attempting given her chronic kidney disease.  However if this occurs I have asked her to check her blood pressures and weight on regular basis.  If her weight starts trending up averaging more than 1 pound over 24 hours or 3 pounds over a week then restarting diuretics to maintain euvolemia on as needed basis is reasonable. Continue carvedilol 3.125 mg p.o. twice daily. Continue Lasix 20 mg p.o. twice daily.  Paroxysmal atrial fibrillation (HCC) Long term current use of anticoagulant Rate control: Carvedilol. Rhythm control: N/A. Thromboembolic prophylaxis: Eliquis. At the current time Eliquis is dosed appropriately. She does not endorse evidence of bleeding. EKG today shows atrially ventricular paced rhythm  Mixed hyperlipidemia Currently on Crestor 10 mg p.o. daily and Zetia 10 mg p.o. daily.   She denies myalgia or other side effects. No recent lipid profile  available for review.   Currently managed by primary care provider.  Orders Placed:  Orders Placed This  Encounter  Procedures   EKG 12-Lead   During today's office visit discussed management of at least 2 chronic comorbid conditions. Most recent echocardiogram from December 2024 independently reviewed and noted above for further reference. EKG today independently reviewed Patient requesting samples for Eliquis. Will make sure she is enrolled into the device clinic for longitudinal follow-up.  Final Medication List:    Meds ordered this encounter  Medications   apixaban (ELIQUIS) 5 MG TABS tablet    Sig: Take 1 tablet (5 mg total) by mouth 2 (two) times daily.    Lot Number?:   [id number]    Expiration Date?:   06/16/2024    Quantity:   28             28 tablets total    Medications Discontinued During This Encounter  Medication Reason   famotidine (PEPCID) 20 MG tablet      Current Outpatient Medications:    acetaminophen (TYLENOL) 500 MG tablet, Take 1,000 mg by mouth 2 (two) times daily as needed (pain)., Disp: , Rfl:    apixaban (ELIQUIS) 5 MG TABS tablet, Take 1 tablet (5 mg total) by mouth 2 (two) times daily., Disp: , Rfl:    aspirin 81 MG chewable tablet, Chew 1 tablet (81 mg total) by mouth daily., Disp: 30 tablet, Rfl: 0   carvedilol (COREG) 3.125 MG tablet, TAKE 1 TABLET BY MOUTH TWICE A DAY WITH FOOD, Disp: 180 tablet, Rfl: 1   Cholecalciferol (VITAMIN D3 PO), Take 1,000 Units by mouth in the morning and at bedtime., Disp: , Rfl:    diphenhydrAMINE (BENADRYL CHILDRENS ALLERGY) 12.5 MG/5ML liquid, Take 25 mg by mouth at bedtime., Disp: , Rfl:    ELIQUIS 5 MG TABS tablet, TAKE 1 TABLET BY MOUTH TWICE A DAY, Disp: 180 tablet, Rfl: 1   ezetimibe (ZETIA) 10 MG tablet, TAKE 1 TABLET BY MOUTH EVERY DAY, Disp: 90 tablet, Rfl: 1   furosemide (LASIX) 20 MG tablet, TAKE 1 TABLET BY MOUTH TWICE A DAY, Disp: 180 tablet, Rfl: 1   Magnesium 400 MG TABS, Take 400 mg by mouth 2 (two) times daily. , Disp: , Rfl:    rosuvastatin (CRESTOR) 10 MG tablet, TAKE 1 TABLET (10 MG TOTAL) BY MOUTH DAILY  AT 6 PM., Disp: 90 tablet, Rfl: 3   sertraline (ZOLOFT) 50 MG tablet, TAKE 1 TABLET BY MOUTH EVERYDAY AT BEDTIME, Disp: 90 tablet, Rfl: 0   vitamin B-12 (CYANOCOBALAMIN) 1000 MCG tablet, Take 1,000 mcg by mouth daily., Disp: , Rfl:   Consent:   N/A  Disposition:   41-month follow-up sooner if needed Patient may be asked to follow-up sooner based on the results of the above-mentioned testing.  Her questions and concerns were addressed to her satisfaction. She voices understanding of the recommendations provided during this encounter.    Signed, Tessa Lerner, DO, [facility]  [facility]  [address] [place], [place] [address] 06/05/2023 4:12 PM

## 2023-06-05 NOTE — Patient Instructions (Signed)
Medication Instructions:  Your physician recommends that you continue on your current medications as directed. Please refer to the Current Medication list given to you today.  **Samples of Eliquis 5 mg given for 2 weeks worth**  *If you need a refill on your cardiac medications before your next appointment, please call your pharmacy*  Lab Work: None ordered today. If you have labs (blood work) drawn today and your tests are completely normal, you will receive your results only by: MyChart Message (if you have MyChart) OR A paper copy in the mail If you have any lab test that is abnormal or we need to change your treatment, we will call you to review the results.  Testing/Procedures: None ordered today.  Follow-Up: At [facility], you and your health needs are our priority.  As part of our continuing mission to provide you with exceptional heart care, we have created designated Provider Care Teams.  These Care Teams include your primary Cardiologist (physician) and Advanced Practice Providers (APPs -  Physician Assistants and Nurse Practitioners) who all work together to provide you with the care you need, when you need it.  We recommend signing up for the patient portal called "MyChart".  Sign up information is provided on this After Visit Summary.  MyChart is used to connect with patients for Virtual Visits (Telemedicine).  Patients are able to view lab/test results, encounter notes, upcoming appointments, etc.  Non-urgent messages can be sent to your provider as well.   To learn more about what you can do with MyChart, go to [email or web address].    Your next appointment:   6 month(s)  The format for your next appointment:   In Person  Provider:   Tessa Lerner, DO {  Other Instructions Device clinic will be reaching out to schedule device checks with you.

## 2023-06-09 ENCOUNTER — Other Ambulatory Visit: Payer: Self-pay | Admitting: Family Medicine

## 2023-06-09 NOTE — Telephone Encounter (Signed)
Please schedule CPE with fasting labs prior for Dr. Bedsole. 

## 2023-06-12 ENCOUNTER — Other Ambulatory Visit: Payer: Self-pay | Admitting: Cardiology

## 2023-07-14 ENCOUNTER — Telehealth: Payer: Self-pay

## 2023-07-14 NOTE — Telephone Encounter (Signed)
Called pt and daughter answer and informed me that she would let her mother know to call the office to schedule a appt for cpe

## 2023-07-14 NOTE — Telephone Encounter (Signed)
Please call patient to set up physical

## 2023-07-22 ENCOUNTER — Ambulatory Visit (INDEPENDENT_AMBULATORY_CARE_PROVIDER_SITE_OTHER): Payer: Medicare HMO

## 2023-07-22 DIAGNOSIS — I428 Other cardiomyopathies: Secondary | ICD-10-CM

## 2023-07-22 LAB — CUP PACEART REMOTE DEVICE CHECK
Battery Remaining Longevity: 34 mo
Battery Voltage: 2.96 V
Brady Statistic AP VP Percent: 95.78 %
Brady Statistic AP VS Percent: 1.18 %
Brady Statistic AS VP Percent: 3 %
Brady Statistic AS VS Percent: 0.04 %
Brady Statistic RA Percent Paced: 96.79 %
Brady Statistic RV Percent Paced: 50.14 %
Date Time Interrogation Session: 20250205023325
HighPow Impedance: 75 Ohm
Implantable Lead Connection Status: 753985
Implantable Lead Connection Status: 753985
Implantable Lead Connection Status: 753985
Implantable Lead Implant Date: 20210311
Implantable Lead Implant Date: 20210311
Implantable Lead Implant Date: 20210311
Implantable Lead Location: 753858
Implantable Lead Location: 753859
Implantable Lead Location: 753860
Implantable Lead Model: 4598
Implantable Lead Model: 5076
Implantable Pulse Generator Implant Date: 20210311
Lead Channel Impedance Value: 228 Ohm
Lead Channel Impedance Value: 232.653
Lead Channel Impedance Value: 232.653
Lead Channel Impedance Value: 241.412
Lead Channel Impedance Value: 241.412
Lead Channel Impedance Value: 399 Ohm
Lead Channel Impedance Value: 456 Ohm
Lead Channel Impedance Value: 456 Ohm
Lead Channel Impedance Value: 456 Ohm
Lead Channel Impedance Value: 475 Ohm
Lead Channel Impedance Value: 513 Ohm
Lead Channel Impedance Value: 513 Ohm
Lead Channel Impedance Value: 665 Ohm
Lead Channel Impedance Value: 779 Ohm
Lead Channel Impedance Value: 817 Ohm
Lead Channel Impedance Value: 817 Ohm
Lead Channel Impedance Value: 817 Ohm
Lead Channel Impedance Value: 836 Ohm
Lead Channel Pacing Threshold Amplitude: 0.5 V
Lead Channel Pacing Threshold Amplitude: 0.5 V
Lead Channel Pacing Threshold Amplitude: 1.875 V
Lead Channel Pacing Threshold Pulse Width: 0.4 ms
Lead Channel Pacing Threshold Pulse Width: 0.4 ms
Lead Channel Pacing Threshold Pulse Width: 0.8 ms
Lead Channel Sensing Intrinsic Amplitude: 1.375 mV
Lead Channel Sensing Intrinsic Amplitude: 1.375 mV
Lead Channel Sensing Intrinsic Amplitude: 19.375 mV
Lead Channel Sensing Intrinsic Amplitude: 19.375 mV
Lead Channel Setting Pacing Amplitude: 1.5 V
Lead Channel Setting Pacing Amplitude: 2.5 V
Lead Channel Setting Pacing Amplitude: 3 V
Lead Channel Setting Pacing Pulse Width: 0.4 ms
Lead Channel Setting Pacing Pulse Width: 0.8 ms
Lead Channel Setting Sensing Sensitivity: 0.3 mV
Zone Setting Status: 755011
Zone Setting Status: 755011

## 2023-08-03 ENCOUNTER — Other Ambulatory Visit: Payer: Self-pay | Admitting: Family Medicine

## 2023-08-03 ENCOUNTER — Ambulatory Visit: Payer: HMO | Attending: Cardiology | Admitting: Cardiology

## 2023-08-03 ENCOUNTER — Telehealth: Payer: Self-pay | Admitting: Cardiology

## 2023-08-03 ENCOUNTER — Other Ambulatory Visit: Payer: Self-pay | Admitting: Cardiology

## 2023-08-03 ENCOUNTER — Encounter: Payer: Self-pay | Admitting: Cardiology

## 2023-08-03 VITALS — BP 150/86 | HR 74 | Ht 64.0 in | Wt 163.2 lb

## 2023-08-03 DIAGNOSIS — D6869 Other thrombophilia: Secondary | ICD-10-CM

## 2023-08-03 DIAGNOSIS — I428 Other cardiomyopathies: Secondary | ICD-10-CM

## 2023-08-03 DIAGNOSIS — I480 Paroxysmal atrial fibrillation: Secondary | ICD-10-CM

## 2023-08-03 DIAGNOSIS — I5032 Chronic diastolic (congestive) heart failure: Secondary | ICD-10-CM | POA: Diagnosis not present

## 2023-08-03 DIAGNOSIS — I472 Ventricular tachycardia, unspecified: Secondary | ICD-10-CM

## 2023-08-03 LAB — CUP PACEART INCLINIC DEVICE CHECK
Battery Remaining Longevity: 34 mo
Battery Voltage: 2.95 V
Brady Statistic AP VP Percent: 96.73 %
Brady Statistic AP VS Percent: 1.14 %
Brady Statistic AS VP Percent: 2.06 %
Brady Statistic AS VS Percent: 0.06 %
Brady Statistic RA Percent Paced: 97.62 %
Brady Statistic RV Percent Paced: 61.11 %
Date Time Interrogation Session: 20250217170928
HighPow Impedance: 68 Ohm
Implantable Lead Connection Status: 753985
Implantable Lead Connection Status: 753985
Implantable Lead Connection Status: 753985
Implantable Lead Implant Date: 20210311
Implantable Lead Implant Date: 20210311
Implantable Lead Implant Date: 20210311
Implantable Lead Location: 753858
Implantable Lead Location: 753859
Implantable Lead Location: 753860
Implantable Lead Model: 4598
Implantable Lead Model: 5076
Implantable Pulse Generator Implant Date: 20210311
Lead Channel Impedance Value: 232.653
Lead Channel Impedance Value: 241.412
Lead Channel Impedance Value: 241.412
Lead Channel Impedance Value: 246.635
Lead Channel Impedance Value: 246.635
Lead Channel Impedance Value: 418 Ohm
Lead Channel Impedance Value: 456 Ohm
Lead Channel Impedance Value: 456 Ohm
Lead Channel Impedance Value: 475 Ohm
Lead Channel Impedance Value: 513 Ohm
Lead Channel Impedance Value: 513 Ohm
Lead Channel Impedance Value: 532 Ohm
Lead Channel Impedance Value: 703 Ohm
Lead Channel Impedance Value: 817 Ohm
Lead Channel Impedance Value: 817 Ohm
Lead Channel Impedance Value: 836 Ohm
Lead Channel Impedance Value: 874 Ohm
Lead Channel Impedance Value: 893 Ohm
Lead Channel Pacing Threshold Amplitude: 0.5 V
Lead Channel Pacing Threshold Amplitude: 0.5 V
Lead Channel Pacing Threshold Amplitude: 1.875 V
Lead Channel Pacing Threshold Pulse Width: 0.4 ms
Lead Channel Pacing Threshold Pulse Width: 0.4 ms
Lead Channel Pacing Threshold Pulse Width: 0.8 ms
Lead Channel Sensing Intrinsic Amplitude: 0.75 mV
Lead Channel Sensing Intrinsic Amplitude: 1 mV
Lead Channel Sensing Intrinsic Amplitude: 19.25 mV
Lead Channel Sensing Intrinsic Amplitude: 20.875 mV
Lead Channel Setting Pacing Amplitude: 1.5 V
Lead Channel Setting Pacing Amplitude: 2.5 V
Lead Channel Setting Pacing Amplitude: 3 V
Lead Channel Setting Pacing Pulse Width: 0.4 ms
Lead Channel Setting Pacing Pulse Width: 0.8 ms
Lead Channel Setting Sensing Sensitivity: 0.3 mV
Zone Setting Status: 755011
Zone Setting Status: 755011

## 2023-08-03 NOTE — Telephone Encounter (Signed)
Pt dropped off [employer] Patient Assistance Form. Form will be in providers box by eod. Please contact pt if you need anything else. Please fax when complete.   Copies are in blue abc folder

## 2023-08-03 NOTE — Patient Instructions (Signed)
Medication Instructions:  Your physician recommends that you continue on your current medications as directed. Please refer to the Current Medication list given to you today.  *If you need a refill on your cardiac medications before your next appointment, please call your pharmacy*   Lab Work: None ordered   Testing/Procedures: None ordered   Follow-Up: At [facility], you and your health needs are our priority.  As part of our continuing mission to provide you with exceptional heart care, we have created designated Provider Care Teams.  These Care Teams include your primary Cardiologist (physician) and Advanced Practice Providers (APPs -  Physician Assistants and Nurse Practitioners) who all work together to provide you with the care you need, when you need it.  Your next appointment:   1 year(s)  The format for your next appointment:   In Person  Provider:   You will see one of the following Advanced Practice Providers on your designated Care Team:   Francis Dowse, Charlott Holler [address] [place], [place] Sherie Don, NP Canary Brim, NP   Thank you for choosing [facility]!!   Dory Horn, RN [phone]

## 2023-08-03 NOTE — Progress Notes (Unsigned)
  Electrophysiology Office Note:   Date:  08/04/2023  ID:  [name], DOB 1950/08/01, MRN [id number]  Primary Cardiologist: Tessa Lerner, DO Primary Heart Failure: None Electrophysiologist: Elianie Hubers [name], MD      History of Present Illness:   [name] is a 73 y.o. female with h/o CVA, VT/VF arrest, torsades, nonischemic cardiomyopathy, PVD, hyperlipidemia, hypertension seen today for routine electrophysiology followup.   Since last being seen in our clinic the patient reports doing well.  She has no chest pain or shortness of breath.  She is able to do her daily activities.  She has no awareness of her ICD.  She is happy with her control..  she denies chest pain, palpitations, dyspnea, PND, orthopnea, nausea, vomiting, dizziness, syncope, edema, weight gain, or early satiety.   Review of systems complete and found to be negative unless listed in HPI.      EP Information / Studies Reviewed:    EKG is not ordered today. EKG from 06/05/2023 reviewed which showed AV paced      ICD Interrogation-  reviewed in detail today,  See PACEART report.  Device History: Medtronic BiV ICD implanted 08/25/2019 for CHF and cardiac arrest History of appropriate therapy: No History of AAD therapy: No   Risk Assessment/Calculations:    CHA2DS2-VASc Score = 7   This indicates a 11.2% annual risk of stroke. The patient's score is based upon: CHF History: 1 HTN History: 1 Diabetes History: 0 Stroke History: 2 Vascular Disease History: 1 Age Score: 1 Gender Score: 1            Physical Exam:   VS:  BP (!) 150/86 (BP Location: Left Arm, Patient Position: Sitting, Cuff Size: Normal)   Pulse 74   Ht 5\' 4"  (1.626 m)   Wt 163 lb 3.2 oz (74 kg)   SpO2 98%   BMI 28.01 kg/m    Wt Readings from Last 3 Encounters:  08/03/23 163 lb 3.2 oz (74 kg)  06/05/23 163 lb 9.6 oz (74.2 kg)  01/19/23 165 lb 9.6 oz (75.1 kg)     GEN: Well nourished, well developed in no acute  distress NECK: No JVD; No carotid bruits CARDIAC: Regular rate and rhythm, no murmurs, rubs, gallops RESPIRATORY:  Clear to auscultation without rales, wheezing or rhonchi  ABDOMEN: Soft, non-tender, non-distended EXTREMITIES:  No edema; No deformity   ASSESSMENT AND PLAN:    Chronic systolic dysfunction s/p Medtronic CRT-D  euvolemic today Stable on an appropriate medical regimen Normal ICD function See Pace Art report Pacing output on LV lead reduced, lower rate reduced to 60.  2.  Torsades/VF arrest: Received shocked by LifeVest.  Post Medtronic CRT-D.  No further arrhythmias.  3.  Paroxysmal atrial fibrillation: Remains in sinus rhythm  4.  Secondary hypercoagulable state: Currently on Eliquis 5 mg twice daily for atrial fibrillation  Disposition:   Follow up with EP APP in 12 months   Signed, Bridget De Beque [name], MD

## 2023-08-04 ENCOUNTER — Telehealth: Payer: Self-pay | Admitting: Pharmacy Technician

## 2023-08-04 NOTE — Telephone Encounter (Signed)
PAP: Application for Eliquis has been submitted to [employer] ([employer]), via fax . Completed forms are scanned in media

## 2023-08-04 NOTE — Telephone Encounter (Signed)
Signed by MD yesterday. Faxed to medication assistance team

## 2023-08-10 NOTE — Telephone Encounter (Signed)
 PAP: Patient has been denied for patient assistance by [employer] ([employer]) due to no reason given. They said they mailed the reason to the patient. Letter scanned in media

## 2023-08-13 DIAGNOSIS — I509 Heart failure, unspecified: Secondary | ICD-10-CM | POA: Diagnosis not present

## 2023-08-13 DIAGNOSIS — I129 Hypertensive chronic kidney disease with stage 1 through stage 4 chronic kidney disease, or unspecified chronic kidney disease: Secondary | ICD-10-CM | POA: Diagnosis not present

## 2023-08-13 DIAGNOSIS — N184 Chronic kidney disease, stage 4 (severe): Secondary | ICD-10-CM | POA: Diagnosis not present

## 2023-08-14 LAB — LAB REPORT - SCANNED: EGFR: 28

## 2023-08-17 DIAGNOSIS — H5789 Other specified disorders of eye and adnexa: Secondary | ICD-10-CM | POA: Diagnosis not present

## 2023-08-17 DIAGNOSIS — Z961 Presence of intraocular lens: Secondary | ICD-10-CM | POA: Diagnosis not present

## 2023-08-19 ENCOUNTER — Encounter: Payer: Self-pay | Admitting: Nephrology

## 2023-08-24 ENCOUNTER — Encounter (INDEPENDENT_AMBULATORY_CARE_PROVIDER_SITE_OTHER): Payer: Medicare HMO | Admitting: Ophthalmology

## 2023-08-24 DIAGNOSIS — I10 Essential (primary) hypertension: Secondary | ICD-10-CM

## 2023-08-24 DIAGNOSIS — Z961 Presence of intraocular lens: Secondary | ICD-10-CM

## 2023-08-24 DIAGNOSIS — H35363 Drusen (degenerative) of macula, bilateral: Secondary | ICD-10-CM

## 2023-08-24 DIAGNOSIS — H33103 Unspecified retinoschisis, bilateral: Secondary | ICD-10-CM

## 2023-08-24 DIAGNOSIS — H3509 Other intraretinal microvascular abnormalities: Secondary | ICD-10-CM

## 2023-08-24 DIAGNOSIS — H35033 Hypertensive retinopathy, bilateral: Secondary | ICD-10-CM

## 2023-08-24 DIAGNOSIS — H04123 Dry eye syndrome of bilateral lacrimal glands: Secondary | ICD-10-CM

## 2023-08-28 ENCOUNTER — Other Ambulatory Visit: Payer: Self-pay | Admitting: Family Medicine

## 2023-08-28 NOTE — Progress Notes (Signed)
 Remote ICD transmission.

## 2023-09-07 ENCOUNTER — Other Ambulatory Visit: Payer: Self-pay | Admitting: Family Medicine

## 2023-09-07 NOTE — Telephone Encounter (Signed)
 Please call and schedule CPE with fasting labs prior with Dr. Ermalene Searing.  Once scheduled, please send back to me to refill medication.

## 2023-09-07 NOTE — Telephone Encounter (Signed)
 Lvmtcb, sent mychart message

## 2023-09-08 NOTE — Telephone Encounter (Signed)
 Spoke to pt's daughter, Neysa Bonito, she states pt is suppose to contact office to schedule appt sometime today.

## 2023-09-09 NOTE — Telephone Encounter (Signed)
 lvmtcb

## 2023-09-10 ENCOUNTER — Ambulatory Visit: Admitting: Family Medicine

## 2023-09-10 ENCOUNTER — Encounter: Payer: Self-pay | Admitting: Family Medicine

## 2023-09-10 VITALS — BP 110/60 | HR 98 | Temp 98.8°F | Ht 63.5 in | Wt 160.0 lb

## 2023-09-10 DIAGNOSIS — Z0000 Encounter for general adult medical examination without abnormal findings: Secondary | ICD-10-CM

## 2023-09-10 DIAGNOSIS — N184 Chronic kidney disease, stage 4 (severe): Secondary | ICD-10-CM

## 2023-09-10 DIAGNOSIS — I428 Other cardiomyopathies: Secondary | ICD-10-CM

## 2023-09-10 DIAGNOSIS — E782 Mixed hyperlipidemia: Secondary | ICD-10-CM | POA: Diagnosis not present

## 2023-09-10 DIAGNOSIS — R1013 Epigastric pain: Secondary | ICD-10-CM | POA: Diagnosis not present

## 2023-09-10 DIAGNOSIS — R197 Diarrhea, unspecified: Secondary | ICD-10-CM | POA: Diagnosis not present

## 2023-09-10 DIAGNOSIS — I5020 Unspecified systolic (congestive) heart failure: Secondary | ICD-10-CM | POA: Diagnosis not present

## 2023-09-10 DIAGNOSIS — Z95810 Presence of automatic (implantable) cardiac defibrillator: Secondary | ICD-10-CM

## 2023-09-10 DIAGNOSIS — I10 Essential (primary) hypertension: Secondary | ICD-10-CM | POA: Diagnosis not present

## 2023-09-10 DIAGNOSIS — I480 Paroxysmal atrial fibrillation: Secondary | ICD-10-CM | POA: Diagnosis not present

## 2023-09-10 DIAGNOSIS — F320 Major depressive disorder, single episode, mild: Secondary | ICD-10-CM

## 2023-09-10 MED ORDER — FAMOTIDINE 20 MG PO TABS: 20.0000 mg | ORAL_TABLET | Freq: Two times a day (BID) | ORAL | 3 refills | Status: DC

## 2023-09-10 MED ORDER — SERTRALINE HCL 50 MG PO TABS: 100.0000 mg | ORAL_TABLET | Freq: Every day | ORAL | 3 refills | Status: DC

## 2023-09-10 NOTE — Assessment & Plan Note (Signed)
Chronic, followed by cardiology

## 2023-09-10 NOTE — Assessment & Plan Note (Signed)
Chronic, well controlled followed by cardiology.  Euvolemic in office today

## 2023-09-10 NOTE — Assessment & Plan Note (Signed)
Stable, chronic.  Continue current medication.  Well-controlled on Coreg 3.125 mg p.o. twice daily, Lasix 20 mg daily

## 2023-09-10 NOTE — Progress Notes (Signed)
 Patient ID: [name], female    DOB: 06/17/1950, 73 y.o.   MRN: [id number]  This visit was conducted in person.  BP 110/60 (BP Location: Left Arm, Patient Position: Sitting, Cuff Size: Normal)   Pulse 98   Temp 98.8 F (37.1 C) (Temporal)   Ht 5' 3.5" (1.613 m)   Wt 160 lb (72.6 kg)   SpO2 95%   BMI 27.90 kg/m    CC:  Chief Complaint  Patient presents with   Annual Exam    Part 2    Subjective:   HPI: [name] is a 73 y.o. female presenting on 09/10/2023 for Annual Exam (Part 2)  The patient presents for complete physical and review of chronic health problems. He/She also has the following acute concerns today:   Acute diarrhea x 3 weeks, no sick contact watery frequent stools.   Some nausea, heartburn reflux. Occurs after eating.  No emesis.  Low abd aching... burping constantly and burning in epigastrium.   No fever.  She has not been taking pepcid  SE to omeprazole in past.  The patient saw a LPN or RN for medicare wellness visit. 12/2022  Prevention and wellness was reviewed in detail. Note reviewed and important notes copied below.  No falls in last 12 months.  Hearing and vision screening performed and results recorded in epic.  Flowsheet Row Clinical Support from 12/16/2022 in [facility] [place]  PHQ-2 Total Score 0      Hypertension:  Well controlled   BP Readings from Last 3 Encounters:  09/10/23 110/60  08/03/23 (!) 150/86  06/05/23 120/64  Using medication without problems or lightheadedness:  none Chest pain with exertion: none Edema:none Short of breath: none Average home BPs: Other issues:   HFrEF, atrial fibrillation followed by cardiology: on eliquis 5 mg BID  for anticoagulation and rate controlled with coreg 3.125 mg BID   Elevated Cholesterol: Due for re-eval on  zetia 10 mg daily and crestor 10 mg daily  Lab Results  Component Value Date   CHOL 125 09/14/2023   HDL 40.90 09/14/2023   LDLCALC  57 09/14/2023   LDLDIRECT 193.0 09/02/2016   TRIG 138.0 09/14/2023   CHOLHDL 3 09/14/2023  Using medications without problems: none Muscle aches:  none Diet compliance: Exercise: walking Other complaints:  CKD followed by Dr. Jacelyn Pi... improving some in recent checks.   MDD, mild   inadequate control on sertraline 50 mg daily.  Some trouble sleeping at night... worrying      Relevant past medical, surgical, family and social history reviewed and updated as indicated. Interim medical history since our last visit reviewed. Allergies and medications reviewed and updated. Outpatient Medications Prior to Visit  Medication Sig Dispense Refill   acetaminophen (TYLENOL) 500 MG tablet Take 1,000 mg by mouth 2 (two) times daily as needed (pain).     apixaban (ELIQUIS) 5 MG TABS tablet Take 1 tablet (5 mg total) by mouth 2 (two) times daily.     aspirin 81 MG chewable tablet Chew 1 tablet (81 mg total) by mouth daily. 30 tablet 0   carvedilol (COREG) 3.125 MG tablet TAKE 1 TABLET BY MOUTH TWICE A DAY WITH FOOD 180 tablet 1   Cholecalciferol (VITAMIN D3 PO) Take 1,000 Units by mouth in the morning and at bedtime.     diphenhydrAMINE (BENADRYL CHILDRENS ALLERGY) 12.5 MG/5ML liquid Take 25 mg by mouth at bedtime.     ezetimibe (ZETIA) 10  MG tablet TAKE 1 TABLET BY MOUTH EVERY DAY 90 tablet 3   Magnesium 400 MG TABS Take 400 mg by mouth daily.     rosuvastatin (CRESTOR) 10 MG tablet TAKE 1 TABLET (10 MG TOTAL) BY MOUTH DAILY AT 6 PM. 90 tablet 3   vitamin B-12 (CYANOCOBALAMIN) 1000 MCG tablet Take 1,000 mcg by mouth daily.     famotidine (PEPCID) 10 MG tablet Take 10 mg by mouth at bedtime.     furosemide (LASIX) 20 MG tablet TAKE 1 TABLET BY MOUTH TWICE A DAY 180 tablet 1   sertraline (ZOLOFT) 50 MG tablet TAKE 1 TABLET BY MOUTH EVERYDAY AT BEDTIME 30 tablet 0   ELIQUIS 5 MG TABS tablet TAKE 1 TABLET BY MOUTH TWICE A DAY (Patient not taking: Reported on 08/03/2023) 180 tablet 1   No  facility-administered medications prior to visit.     Per HPI unless specifically indicated in ROS section below Review of Systems  Constitutional:  Negative for fatigue and fever.  HENT:  Negative for congestion.   Eyes:  Negative for pain.  Respiratory:  Negative for cough and shortness of breath.   Cardiovascular:  Negative for chest pain, palpitations and leg swelling.  Gastrointestinal:  Negative for abdominal pain.  Genitourinary:  Negative for dysuria and vaginal bleeding.  Musculoskeletal:  Negative for back pain.  Neurological:  Negative for syncope, light-headedness and headaches.  Psychiatric/Behavioral:  Negative for dysphoric mood.    Objective:  BP 110/60 (BP Location: Left Arm, Patient Position: Sitting, Cuff Size: Normal)   Pulse 98   Temp 98.8 F (37.1 C) (Temporal)   Ht 5' 3.5" (1.613 m)   Wt 160 lb (72.6 kg)   SpO2 95%   BMI 27.90 kg/m   Wt Readings from Last 3 Encounters:  09/10/23 160 lb (72.6 kg)  08/03/23 163 lb 3.2 oz (74 kg)  06/05/23 163 lb 9.6 oz (74.2 kg)      Physical Exam Vitals and nursing note reviewed.  Constitutional:      General: She is not in acute distress.    Appearance: Normal appearance. She is well-developed. She is not ill-appearing or toxic-appearing.  HENT:     Head: Normocephalic.     Right Ear: Hearing, tympanic membrane, ear canal and external ear normal.     Left Ear: Hearing, tympanic membrane, ear canal and external ear normal.     Nose: Nose normal.  Eyes:     General: Lids are normal. Lids are everted, no foreign bodies appreciated.     Conjunctiva/sclera: Conjunctivae normal.     Pupils: Pupils are equal, round, and reactive to light.  Neck:     Thyroid: No thyroid mass or thyromegaly.     Vascular: No carotid bruit.     Trachea: Trachea normal.  Cardiovascular:     Rate and Rhythm: Normal rate and regular rhythm.     Heart sounds: Normal heart sounds, S1 normal and S2 normal. No murmur heard.    No gallop.   Pulmonary:     Effort: Pulmonary effort is normal. No respiratory distress.     Breath sounds: Normal breath sounds. No wheezing, rhonchi or rales.  Abdominal:     General: Bowel sounds are normal. There is no distension or abdominal bruit.     Palpations: Abdomen is soft. There is no fluid wave or mass.     Tenderness: There is no abdominal tenderness. There is no guarding or rebound.     Hernia: No hernia  is present.  Musculoskeletal:     Cervical back: Normal range of motion and neck supple.  Lymphadenopathy:     Cervical: No cervical adenopathy.  Skin:    General: Skin is warm and dry.     Findings: No rash.  Neurological:     Mental Status: She is alert.     Cranial Nerves: No cranial nerve deficit.     Sensory: No sensory deficit.  Psychiatric:        Mood and Affect: Mood is not anxious or depressed.        Speech: Speech normal.        Behavior: Behavior normal. Behavior is cooperative.        Judgment: Judgment normal.       Results for orders placed or performed in visit on 08/19/23  Lab report - scanned   Collection Time: 08/14/23  1:25 PM  Result Value Ref Range   EGFR 28.0     This visit occurred during the SARS-CoV-2 public health emergency.  Safety protocols were in place, including screening questions prior to the visit, additional usage of staff PPE, and extensive cleaning of exam room while observing appropriate contact time as indicated for disinfecting solutions.   COVID 19 screen:  No recent travel or known exposure to COVID19 The patient denies respiratory symptoms of COVID 19 at this time. The importance of social distancing was discussed today.   Assessment and Plan The patient's preventative maintenance and recommended screening tests for an annual wellness exam were reviewed in full today. Brought up to date unless services declined.  Counselled on the importance of diet, exercise, and its role in overall health and mortality. The patient's FH  and SH was reviewed, including their home life, tobacco status, and drug and alcohol status.   Vaccines: refused  Shingrix and tdap at this time.[name] uptodate Prevnar 20 Pap/DVE:   not indicated given age Mammo:  mother breast cancer, due Bone Density: not interested in bone density despite need. Colon:  2017, she has been told that she is  high risk for bleed on blood thinners.. does not wish to proceed. Smoking Status: former smoker... QUIT 2021,  50 pack year history.[name] discussed lung cancer screening program... not interested ETOH/ drug use: none/none  Hep C:  done  Problem List Items Addressed This Visit     Acute diarrhea    Acute, persistent x 3 weeks.. eval with Gi pathogen panel and Cdiff testing.   Keep up with liquids.  Consider stopping Magnesium temporarily.      Relevant Orders   C. 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      Epigastric pain   Acute.. possibly  GERd, gastritits.[name] eval with labs   Restart Pepcid AC.       Relevant Orders   Lipase (Completed)   Helicobacter Pylori Special Antigen, Stool (Completed)   Essential hypertension (Chronic)   Stable, chronic.  Continue current medication.  Well-controlled on Coreg 3.125 mg p.o. twice daily, Lasix 20 mg daily      HFrEF (heart failure with reduced ejection fraction) (HCC) (Chronic)   Chronic, well controlled followed by cardiology.  Euvolemic in office today      HLD (hyperlipidemia) (Chronic)   Chronic,  due for re-eval on Crestor 10 mg daily and Zetia 10  mg daily.       Relevant Orders   Lipid panel (Completed)   Comprehensive metabolic panel with GFR (Completed)   ICD BiV Medtronic model [id number] Claria Quad CRT-D SureScan ICD 08/25/2019 (Chronic)   MDD (major depressive disorder), single episode, mild (HCC)    Chronic, inadequate controlled on sertraline 50 mg p.o. daily given increase in family stress with son drug issues. Offered counseling, she will consider. Will increase sertraline to 100 mg daily.      Relevant Medications   sertraline (ZOLOFT) 50 MG tablet   Nonischemic cardiomyopathy (HCC)   Chronic, followed by cardiology        Paroxysmal atrial fibrillation (HCC)   Rate controlled on Coreg, anticoagulation with Eliquis 5 mg p.o. twice daily.  Followed by cardiology.      Other Visit Diagnoses       Routine general medical examination at a health care facility    -  Primary           Kerby Nora, MD

## 2023-09-10 NOTE — Assessment & Plan Note (Signed)
Rate controlled on Coreg, anticoagulation with Eliquis 5 mg p.o. twice daily.  Followed by cardiology.

## 2023-09-10 NOTE — Assessment & Plan Note (Signed)
 Due for reevaluation, stable on recent labs in February.

## 2023-09-10 NOTE — Patient Instructions (Addendum)
 Restart Pepcid AC.  Keep up with liquids.  Consider stopping Magnesium temporarily.  Return for labs and stool studies.... stop at lab to pick up.  Increase sertraline 100 mg at night.. follow up if not improving as expected.

## 2023-09-10 NOTE — Assessment & Plan Note (Addendum)
 Chronic, inadequate controlled on sertraline 50 mg p.o. daily given increase in family stress with son drug issues. Offered counseling, she will consider. Will increase sertraline to 100 mg daily.

## 2023-09-10 NOTE — Assessment & Plan Note (Signed)
 Chronic,  due for re-eval on Crestor 10 mg daily and Zetia 10 mg daily.

## 2023-09-11 ENCOUNTER — Other Ambulatory Visit: Payer: Self-pay

## 2023-09-11 DIAGNOSIS — R197 Diarrhea, unspecified: Secondary | ICD-10-CM

## 2023-09-11 DIAGNOSIS — R1013 Epigastric pain: Secondary | ICD-10-CM | POA: Diagnosis not present

## 2023-09-14 ENCOUNTER — Other Ambulatory Visit (INDEPENDENT_AMBULATORY_CARE_PROVIDER_SITE_OTHER)

## 2023-09-14 DIAGNOSIS — R1013 Epigastric pain: Secondary | ICD-10-CM | POA: Diagnosis not present

## 2023-09-14 DIAGNOSIS — E782 Mixed hyperlipidemia: Secondary | ICD-10-CM

## 2023-09-14 LAB — COMPREHENSIVE METABOLIC PANEL WITH GFR
ALT: 7 U/L (ref 0–35)
AST: 19 U/L (ref 0–37)
Albumin: 4.5 g/dL (ref 3.5–5.2)
Alkaline Phosphatase: 61 U/L (ref 39–117)
BUN: 19 mg/dL (ref 6–23)
CO2: 26 meq/L (ref 19–32)
Calcium: 9.2 mg/dL (ref 8.4–10.5)
Chloride: 108 meq/L (ref 96–112)
Creatinine, Ser: 1.97 mg/dL — ABNORMAL HIGH (ref 0.40–1.20)
GFR: 24.83 mL/min — ABNORMAL LOW (ref >60.00)
Glucose, Bld: 85 mg/dL (ref 70–99)
Potassium: 4 meq/L (ref 3.5–5.1)
Sodium: 142 meq/L (ref 135–145)
Total Bilirubin: 0.4 mg/dL (ref 0.2–1.2)
Total Protein: 7.3 g/dL (ref 6.0–8.3)

## 2023-09-14 LAB — LIPID PANEL
Cholesterol: 125 mg/dL (ref 0–200)
HDL: 40.9 mg/dL (ref >39.00)
LDL Cholesterol: 57 mg/dL (ref 0–99)
NonHDL: 84.5
Total CHOL/HDL Ratio: 3
Triglycerides: 138 mg/dL (ref 0.0–149.0)
VLDL: 27.6 mg/dL (ref 0.0–40.0)

## 2023-09-14 LAB — LIPASE: Lipase: 207 U/L — ABNORMAL HIGH (ref 11.0–59.0)

## 2023-09-15 ENCOUNTER — Encounter: Payer: Self-pay | Admitting: Family Medicine

## 2023-09-15 LAB — GASTROINTESTINAL PATHOGEN PNL
CampyloBacter Group: NOT DETECTED
Norovirus GI/GII: NOT DETECTED
Rotavirus A: NOT DETECTED
Salmonella species: NOT DETECTED
Shiga Toxin 1: NOT DETECTED
Shiga Toxin 2: NOT DETECTED
Shigella Species: NOT DETECTED
Vibrio Group: NOT DETECTED
Yersinia enterocolitica: NOT DETECTED

## 2023-09-15 LAB — C. DIFFICILE GDH AND TOXIN A/B
GDH ANTIGEN: NOT DETECTED
MICRO NUMBER:: 16261312
SPECIMEN QUALITY:: ADEQUATE
TOXIN A AND B: NOT DETECTED

## 2023-09-15 LAB — HELICOBACTER PYLORI  SPECIAL ANTIGEN
MICRO NUMBER:: 16261313
SPECIMEN QUALITY: ADEQUATE

## 2023-09-18 NOTE — Progress Notes (Signed)
 [facility] - Clinic Note  09/21/2023     CHIEF COMPLAINT Patient presents for Retina Follow Up   HISTORY OF PRESENT ILLNESS: [name] is a 73 y.o. female who presents to the clinic today for:   HPI     Retina Follow Up   Patient presents with  Other.  Duration of 5 months.  Since onset it is stable.  I, the attending physician,  performed the HPI with the patient and updated documentation appropriately.        Comments   5 month Retina eval. Patient states no vision changes. Patient is 2 months late. Checked patients vision with J card      Last edited by Rennis Chris, MD on 09/21/2023 10:17 PM.     Patient feels the vision is doing well.   Referring physician: Excell Seltzer, MD [address] [place],  [place] [address]  HISTORICAL INFORMATION:   Selected notes from the MEDICAL RECORD NUMBER Referred by Dr. Council Mechanic for retinoschisis OU LEE: 08.29.19 (K. Hallahan) [BCVA: OD: 20/20 OS: 20/20-] Ocular Hx-cataracts OU, glaucoma suspect OU PMH-anxiety, HTN, current smoker    CURRENT MEDICATIONS: No current outpatient medications on file. (Ophthalmic Drugs)   No current facility-administered medications for this visit. (Ophthalmic Drugs)   Current Outpatient Medications (Other)  Medication Sig   acetaminophen (TYLENOL) 500 MG tablet Take 1,000 mg by mouth 2 (two) times daily as needed (pain).   apixaban (ELIQUIS) 5 MG TABS tablet Take 1 tablet (5 mg total) by mouth 2 (two) times daily.   aspirin 81 MG chewable tablet Chew 1 tablet (81 mg total) by mouth daily.   carvedilol (COREG) 3.125 MG tablet TAKE 1 TABLET BY MOUTH TWICE A DAY WITH FOOD   Cholecalciferol (VITAMIN D3 PO) Take 1,000 Units by mouth in the morning and at bedtime.   diphenhydrAMINE (BENADRYL CHILDRENS ALLERGY) 12.5 MG/5ML liquid Take 25 mg by mouth at bedtime.   ezetimibe (ZETIA) 10 MG tablet TAKE 1 TABLET BY MOUTH EVERY DAY   famotidine (PEPCID) 20 MG tablet  Take 1 tablet (20 mg total) by mouth 2 (two) times daily.   Magnesium 400 MG TABS Take 400 mg by mouth daily.   rosuvastatin (CRESTOR) 10 MG tablet TAKE 1 TABLET (10 MG TOTAL) BY MOUTH DAILY AT 6 PM.   sertraline (ZOLOFT) 50 MG tablet Take 2 tablets (100 mg total) by mouth at bedtime.   vitamin B-12 (CYANOCOBALAMIN) 1000 MCG tablet Take 1,000 mcg by mouth daily.   No current facility-administered medications for this visit. (Other)   REVIEW OF SYSTEMS: ROS   Positive for: Gastrointestinal, Neurological, Musculoskeletal, Cardiovascular, Eyes Negative for: Constitutional, Skin, Genitourinary, HENT, Endocrine, Respiratory, Psychiatric, Allergic/Imm, Heme/Lymph Last edited by Lana Fish, COT on 09/21/2023  1:19 PM.     ALLERGIES Allergies  Allergen Reactions   Shellfish-Derived Products Anaphylaxis   Ativan [Lorazepam] Other (See Comments)    Makes "skin crawl" and insomnia   Buspar [Buspirone] Nausea Only    Sweating and dizzy   Clarithromycin Swelling   Codeine Nausea And Vomiting   Doxycycline Other (See Comments)    Unknown   Guaifenesin Other (See Comments)    Palpitations   Oxycodone Nausea And Vomiting   Prednisone Nausea And Vomiting and Other (See Comments)    Makes my heart race   Statins Other (See Comments)    Muscle pain   Sulfonamide Derivatives Nausea And Vomiting    Achiness  Tetracycline Nausea Only   Vicodin [Hydrocodone-Acetaminophen] Nausea And Vomiting   Famotidine Rash   Latex Rash   Omeprazole Nausea Only    Cough, shortness of breath - patient doesn't remember   Peanut-Containing Drug Products Itching and Rash   Protonix [Pantoprazole] Rash   Wellbutrin [Bupropion] Palpitations   PAST MEDICAL HISTORY Past Medical History:  Diagnosis Date   Allergy    Anxiety    Arnold-Chiari malformation (HCC)    Arthritis    CHF (congestive heart failure) (HCC)    Chronic systolic heart failure (HCC) 11/03/2019   Coronary artery disease    Dyspnea     Encounter for assessment of implantable cardioverter-defibrillator (ICD) 11/10/2019   GERD (gastroesophageal reflux disease)    H. pylori infection    3 years ago   H/O cardiac arrest    Hx of VT/Vfib arrest   Hyperlipidemia    ICD BiV Medtronic model [id number] Claria Quad CRT-D SureScan ICD 08/25/2019    Incontinence    Paroxysmal atrial fibrillation (HCC) 08/06/2019   Pulmonary edema 2021   Syncope and collapse    Per pt, denies passing out   Tobacco use disorder    Unspecified essential hypertension    Past Surgical History:  Procedure Laterality Date   APPENDECTOMY     ARNOLD CHIARI SURGERY     neurocranial surgery   BIV ICD INSERTION CRT-D N/A 08/25/2019   Procedure: BIV ICD INSERTION CRT-D;  Surgeon: Regan Lemming, MD;  Location: [facility] INVASIVE CV LAB;  Service: Cardiovascular;  Laterality: N/A;   BLEPHAROPLASTY     Bil   C-EYE SURGERY PROCEDURE     CARDIAC CATHETERIZATION     CATARACT EXTRACTION Bilateral    CHOLECYSTECTOMY     EYE SURGERY     heart failure     LEFT HEART CATH AND CORONARY ANGIOGRAPHY N/A 08/04/2019   Procedure: LEFT HEART CATH AND CORONARY ANGIOGRAPHY;  Surgeon: Yates Decamp, MD;  Location: [facility] INVASIVE CV LAB;  Service: Cardiovascular;  Laterality: N/A;   MASS EXCISION Right 12/27/2013   Procedure: MINOR EXCISION OF RIGHT THUMB MUCOID CYST, DEBRIDEMENT OF INTERPHALANGEAL JOINT;  Surgeon: Wyn Forster, MD;  Location: [facility];  Service: Orthopedics;  Laterality: Right;   mass on thumb  right   TOTAL KNEE ARTHROPLASTY Right 02/17/2017   TOTAL KNEE ARTHROPLASTY Right 02/17/2017   Procedure: TOTAL KNEE ARTHROPLASTY;  Surgeon: Marcene Corning, MD;  Location: [facility] OR;  Service: Orthopedics;  Laterality: Right;   TUBAL LIGATION     FAMILY HISTORY Family History  Problem Relation Age of Onset   Bone cancer Mother    Heart disease Mother    Cancer Sister        metastatic; unknown primary   Diverticulosis Sister    Tuberculosis  Father    Hypertension Sister    Colon cancer Neg Hx    Esophageal cancer Neg Hx    Pancreatic cancer Neg Hx    Stomach cancer Neg Hx    Liver disease Neg Hx    Kidney disease Neg Hx    Rectal cancer Neg Hx    Pancreatic disease Neg Hx    SOCIAL HISTORY Social History   Tobacco Use   Smoking status: Former    Current packs/day: 0.00    Average packs/day: 1 pack/day for 50.0 years (50.0 ttl pk-yrs)    Types: Cigarettes    Start date: 08/07/1969    Quit date: 08/08/2019    Years since quitting: 4.1  Smokeless tobacco: Never  Vaping Use   Vaping status: Never Used  Substance Use Topics   Alcohol use: No    Alcohol/week: 0.0 standard drinks of alcohol   Drug use: No       OPHTHALMIC EXAM:  Base Eye Exam     Visual Acuity (Snellen - Linear)       Right Left   Dist cc 20/20 20/20    Correction: Glasses         Tonometry (Tonopen, 1:25 PM)       Right Left   Pressure 10 12         Pupils       Dark Light Shape React APD   Right 4 3 Round Brisk None   Left 4 3 Round Brisk None         Visual Fields (Counting fingers)       Left Right    Full Full         Extraocular Movement       Right Left    Full, Ortho Full, Ortho         Neuro/Psych     Oriented x3: Yes   Mood/Affect: Normal         Dilation     Both eyes: 1.0% Mydriacyl @ 1:25 PM           Slit Lamp and Fundus Exam     Slit Lamp Exam       Right Left   Lids/Lashes Dermatochalasis - upper lid Dermatochalasis - upper lid, Meibomian gland dysfunction   Conjunctiva/Sclera Nasal and Temporal Pinguecula Nasal and Temporal Pinguecula   Cornea Arcus, 1+ Punctate epithelial erosions, Debris in tear film, Well healed temporal cataract wound Arcus, 2+ Punctate epithelial erosions   Anterior Chamber deep, clear, no cell/flare deep, clear, no cell/flare   Iris Round and dilated Round and dilated   Lens PC IOL in good postition PC IOL in good postition   Anterior Vitreous  Vitreous syneresis Vitreous syneresis, Posterior vitreous detachment         Fundus Exam       Right Left   Disc Pink and Sharp Pink and Sharp, mild central Pallor, +cupping   C/D Ratio 0.6 0.7   Macula blunted foveal reflex, scattered MA, no edema, +druplets, RPE mottling and clumping, trace central cystic changes- improved Flat, good foveal reflex, Retinal pigment epithelial mottling and clumping, +drusen, No heme or edema   Vessels attenuated, Tortuous attenuated, Tortuous   Periphery Attached, bullous retinoschisis cavity inferiorly from 0600 to 0730 - good laser surrounding, inferior reticular degeneration, No RT/RD on 360 exam Attached, bullous retinoschisis cavity from 0430 to 0600 -- good laser surrounding, reticular degeneration, RPE mottling, No new RT/RD            IMAGING AND PROCEDURES  Imaging and Procedures for @TODAY @  OCT, Retina - OU - Both Eyes       Right Eye Quality was good. Central Foveal Thickness: 300. Progression has improved. Findings include normal foveal contour, no SRF, retinal drusen (Focal cystic changes/IRF IN fovea-- improved, Inferotemporal retinoschisis with surrounding ORA (laser) caught on widefield OCT - stable).   Left Eye Quality was good. Central Foveal Thickness: 284. Progression has been stable. Findings include normal foveal contour, no IRF, no SRF, retinal drusen (Inferotemporal retinoschisis caught on widefield OCT ).   Notes *Images captured and stored on drive  Diagnosis / Impression:  NFP; no IRF OU OD: Focal cystic changes/IRF IN  fovea--improved, Inferotemporal retinoschisis with surrounding ORA (laser) caught on widefield OCT - stable Retinal drusen OU   Clinical management:  See below  Abbreviations: NFP - Normal foveal profile. CME - cystoid macular edema. PED - pigment epithelial detachment. IRF - intraretinal fluid. SRF - subretinal fluid. EZ - ellipsoid zone. ERM - epiretinal membrane. ORA - outer retinal atrophy.  ORT - outer retinal tubulation. 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 Essential hypertension  I10     5. Hypertensive retinopathy of both eyes  H35.033     6. Pseudophakia  Z96.1     7. Dry eyes, bilateral  H04.123      1. Retinoschisis OU  - bullous, inferotemporal retinoschisis cavities OU (OD > OS)   - schisis confirmed by widefield OCT -- stable  - no retinal tears or holes on depressed exam - fairly large cavity with significant posterior extension -- notable in comparison to 02/2018 baseline images  - s/p laser retinopexy OD (04.08.24) -- good laser surrounding - s/p laser retinopexy OS (04.22.24) -- good laser surrounding - stable  - F/U 6 months DFE, OCT, sooner prn  2. Retinal drusen OU  - mild OCT findings -- stable from prior  - monitor  3. Hx of refractile arteriolar plaque superiorly OD - located at 1 o'clock midzone at arteriolar bifurcation -- not present today ?resolved  - good distal perfusion   - monitor  4,5. Hypertensive retinopathy OU  - discussed importance of tight BP control  - monitor  6. Pseudophakia OU  - s/p CE/IOL OU in August 2024 with Dr. Laruth Bouchard  - IOLs in good position, doing well  - BCVA 20/20 OU  - OCT OD with mild cystic changes -- ?early CME  - monitor -- f/u in 3 mos  7. Dry eyes OU - recommend artificial tears and lubricating ointment as needed   Ophthalmic Meds Ordered this visit:  No orders of the defined types were placed in this encounter.    Return in about 6 months (around 03/22/2024) for f/u Schisis OU , DFE, OCT.  There are no Patient Instructions on file for this visit.   Explained the diagnoses, plan, and follow up with the patient and they expressed understanding.  Patient expressed  understanding of the importance of proper follow up care.    This document serves as a record of services personally performed by Karie Chimera, MD, PhD. It was created on their behalf by Glee Arvin. Manson Passey, OA an ophthalmic technician. The creation of this record is the provider's dictation and/or activities during the visit.    Electronically signed by: Glee Arvin. Manson Passey, OA 09/21/23 10:20 PM  This document serves as a record of services personally performed by Karie Chimera, MD, PhD. It was created on their behalf by Charlette Caffey, COT an ophthalmic technician. The creation of this record is the provider's dictation and/or activities during the visit.    Electronically signed by:  Charlette Caffey, COT  09/21/23 10:20 PM  Karie Chimera, M.D., Ph.D. Diseases & Surgery of the Retina and Vitreous [facility]  I  have reviewed the above documentation for accuracy and completeness, and I agree with the above. Karie Chimera, M.D., Ph.D. 09/21/23 10:21 PM   Abbreviations: M myopia (nearsighted); A astigmatism; H hyperopia (farsighted); P presbyopia; Mrx spectacle prescription;  CTL contact lenses; OD right eye; OS left eye; OU both eyes  XT exotropia; ET esotropia; PEK punctate epithelial keratitis; PEE punctate epithelial erosions; DES dry eye syndrome; MGD meibomian gland dysfunction; ATs artificial tears; PFAT's preservative free artificial tears; NSC nuclear sclerotic cataract; PSC posterior subcapsular cataract; ERM epi-retinal membrane; PVD posterior vitreous detachment; RD retinal detachment; DM diabetes mellitus; DR diabetic retinopathy; NPDR non-proliferative diabetic retinopathy; PDR proliferative diabetic retinopathy; CSME clinically significant macular edema; DME diabetic macular edema; dbh dot blot hemorrhages; CWS cotton wool spot; POAG primary open angle glaucoma; C/D cup-to-disc ratio; HVF humphrey visual field; GVF goldmann visual field; OCT optical  coherence tomography; IOP intraocular pressure; BRVO Branch retinal vein occlusion; CRVO central retinal vein occlusion; CRAO central retinal artery occlusion; BRAO branch retinal artery occlusion; RT retinal tear; SB scleral buckle; PPV pars plana vitrectomy; VH Vitreous hemorrhage; PRP panretinal laser photocoagulation; IVK intravitreal kenalog; VMT vitreomacular traction; MH Macular hole;  NVD neovascularization of the disc; NVE neovascularization elsewhere; AREDS age related eye disease study; ARMD age related macular degeneration; POAG primary open angle glaucoma; EBMD epithelial/anterior basement membrane dystrophy; ACIOL anterior chamber intraocular lens; IOL intraocular lens; PCIOL posterior chamber intraocular lens; Phaco/IOL phacoemulsification with intraocular lens placement; PRK photorefractive keratectomy; LASIK laser assisted in situ keratomileusis; HTN hypertension; DM diabetes mellitus; COPD chronic obstructive pulmonary disease

## 2023-09-21 ENCOUNTER — Encounter: Payer: Self-pay | Admitting: Cardiology

## 2023-09-21 ENCOUNTER — Encounter (INDEPENDENT_AMBULATORY_CARE_PROVIDER_SITE_OTHER): Payer: Self-pay | Admitting: Ophthalmology

## 2023-09-21 ENCOUNTER — Ambulatory Visit (INDEPENDENT_AMBULATORY_CARE_PROVIDER_SITE_OTHER): Admitting: Ophthalmology

## 2023-09-21 DIAGNOSIS — H35363 Drusen (degenerative) of macula, bilateral: Secondary | ICD-10-CM | POA: Diagnosis not present

## 2023-09-21 DIAGNOSIS — H35033 Hypertensive retinopathy, bilateral: Secondary | ICD-10-CM

## 2023-09-21 DIAGNOSIS — Z961 Presence of intraocular lens: Secondary | ICD-10-CM

## 2023-09-21 DIAGNOSIS — H04123 Dry eye syndrome of bilateral lacrimal glands: Secondary | ICD-10-CM | POA: Diagnosis not present

## 2023-09-21 DIAGNOSIS — I10 Essential (primary) hypertension: Secondary | ICD-10-CM | POA: Diagnosis not present

## 2023-09-21 DIAGNOSIS — H33103 Unspecified retinoschisis, bilateral: Secondary | ICD-10-CM

## 2023-09-21 DIAGNOSIS — H3509 Other intraretinal microvascular abnormalities: Secondary | ICD-10-CM | POA: Diagnosis not present

## 2023-09-24 NOTE — Assessment & Plan Note (Addendum)
 Acute.. possibly  GERd, gastritits.[name] eval with labs   Restart Pepcid AC.

## 2023-09-24 NOTE — Assessment & Plan Note (Addendum)
 Acute, persistent x 3 weeks.. eval with Gi pathogen panel and Cdiff testing.   Keep up with liquids.  Consider stopping Magnesium temporarily.

## 2023-10-01 DIAGNOSIS — L821 Other seborrheic keratosis: Secondary | ICD-10-CM | POA: Diagnosis not present

## 2023-10-01 DIAGNOSIS — D492 Neoplasm of unspecified behavior of bone, soft tissue, and skin: Secondary | ICD-10-CM | POA: Diagnosis not present

## 2023-10-01 DIAGNOSIS — L538 Other specified erythematous conditions: Secondary | ICD-10-CM | POA: Diagnosis not present

## 2023-10-05 ENCOUNTER — Other Ambulatory Visit: Payer: Self-pay | Admitting: Cardiology

## 2023-10-05 DIAGNOSIS — I480 Paroxysmal atrial fibrillation: Secondary | ICD-10-CM

## 2023-10-05 DIAGNOSIS — Z7901 Long term (current) use of anticoagulants: Secondary | ICD-10-CM

## 2023-10-05 NOTE — Telephone Encounter (Signed)
 Prescription refill request for Eliquis  received. Indication: PAF Last office visit: 08/03/23  Janice Armor MD Scr: 1.97 on 09/14/23  Epic Age: 73 Weight: 74kg  Based on above findings Eliquis  5mg  twice daily is the appropriate dose.  Refill approved.

## 2023-10-12 ENCOUNTER — Other Ambulatory Visit: Payer: Self-pay | Admitting: Family Medicine

## 2023-10-21 ENCOUNTER — Ambulatory Visit (INDEPENDENT_AMBULATORY_CARE_PROVIDER_SITE_OTHER): Payer: Medicare HMO

## 2023-10-21 DIAGNOSIS — I428 Other cardiomyopathies: Secondary | ICD-10-CM

## 2023-10-21 LAB — CUP PACEART REMOTE DEVICE CHECK
Battery Remaining Longevity: 40 mo
Battery Voltage: 2.97 V
Brady Statistic AP VP Percent: 82.73 %
Brady Statistic AP VS Percent: 1.32 %
Brady Statistic AS VP Percent: 15.73 %
Brady Statistic AS VS Percent: 0.22 %
Brady Statistic RA Percent Paced: 83.75 %
Brady Statistic RV Percent Paced: 24.33 %
Date Time Interrogation Session: 20250507001703
HighPow Impedance: 77 Ohm
Implantable Lead Connection Status: 753985
Implantable Lead Connection Status: 753985
Implantable Lead Connection Status: 753985
Implantable Lead Implant Date: 20210311
Implantable Lead Implant Date: 20210311
Implantable Lead Implant Date: 20210311
Implantable Lead Location: 753858
Implantable Lead Location: 753859
Implantable Lead Location: 753860
Implantable Lead Model: 4598
Implantable Lead Model: 5076
Implantable Pulse Generator Implant Date: 20210311
Lead Channel Impedance Value: 250.943
Lead Channel Impedance Value: 255.093
Lead Channel Impedance Value: 255.093
Lead Channel Impedance Value: 270.667
Lead Channel Impedance Value: 270.667
Lead Channel Impedance Value: 456 Ohm
Lead Channel Impedance Value: 475 Ohm
Lead Channel Impedance Value: 513 Ohm
Lead Channel Impedance Value: 532 Ohm
Lead Channel Impedance Value: 532 Ohm
Lead Channel Impedance Value: 551 Ohm
Lead Channel Impedance Value: 551 Ohm
Lead Channel Impedance Value: 760 Ohm
Lead Channel Impedance Value: 893 Ohm
Lead Channel Impedance Value: 893 Ohm
Lead Channel Impedance Value: 931 Ohm
Lead Channel Impedance Value: 931 Ohm
Lead Channel Impedance Value: 950 Ohm
Lead Channel Pacing Threshold Amplitude: 0.5 V
Lead Channel Pacing Threshold Amplitude: 0.5 V
Lead Channel Pacing Threshold Amplitude: 1.875 V
Lead Channel Pacing Threshold Pulse Width: 0.4 ms
Lead Channel Pacing Threshold Pulse Width: 0.4 ms
Lead Channel Pacing Threshold Pulse Width: 0.8 ms
Lead Channel Sensing Intrinsic Amplitude: 0.75 mV
Lead Channel Sensing Intrinsic Amplitude: 0.75 mV
Lead Channel Sensing Intrinsic Amplitude: 20 mV
Lead Channel Sensing Intrinsic Amplitude: 20 mV
Lead Channel Setting Pacing Amplitude: 1.5 V
Lead Channel Setting Pacing Amplitude: 2.5 V
Lead Channel Setting Pacing Amplitude: 2.5 V
Lead Channel Setting Pacing Pulse Width: 0.4 ms
Lead Channel Setting Pacing Pulse Width: 0.8 ms
Lead Channel Setting Sensing Sensitivity: 0.3 mV
Zone Setting Status: 755011
Zone Setting Status: 755011

## 2023-11-12 ENCOUNTER — Ambulatory Visit: Payer: Self-pay

## 2023-11-12 NOTE — Telephone Encounter (Signed)
  Chief Complaint: Back pain Symptoms: lower left side back pain shoots into left leg Frequency: yesterday afternoon Pertinent Negatives: Patient denies fever, SOB Disposition: [] ED /[] Urgent Care (no appt availability in office) / [x] Appointment(In office/virtual)/ []  [facility]/ [] Home Care/ [] Refused Recommended Disposition /[] [facility]/ []  Follow-up with PCP Additional Notes: Pt states that yesterday afternoon her back started hurting. Pain is lower back more on the left side and shoots down left leg. Pt refuses ED/UC and has scheduled an OV for 11/13/2023.    Copied from CRM [id number]. Topic: Clinical - Red Word Triage >> Nov 12, 2023  4:03 PM Crispin Dolphin wrote: Red Word that prompted transfer to Nurse Triage: Back pain - hurt back - hard to get up and down. Reason for Disposition  [1] SEVERE back pain (e.g., excruciating, unable to do any normal activities) AND [2] not improved 2 hours after pain medicine  Answer Assessment - Initial Assessment Questions 1. ONSET: "When did the pain begin?"      Off and on for years - got much worse yesterday evening 2. LOCATION: "Where does it hurt?" (upper, mid or lower back)     Lower left side 3. SEVERITY: "How bad is the pain?"  (e.g., Scale 1-10; mild, moderate, or severe)   - MILD (1-3): Doesn't interfere with normal activities.    - MODERATE (4-7): Interferes with normal activities or awakens from sleep.    - SEVERE (8-10): Excruciating pain, unable to do any normal activities.      severe 4. PATTERN: "Is the pain constant?" (e.g., yes, no; constant, intermittent)      Only when she moves 5. RADIATION: "Does the pain shoot into your legs or somewhere else?"     Into left 6. CAUSE:  "What do you think is causing the back pain?"      Flipping over in bed 7. BACK OVERUSE:  "Any recent lifting of heavy objects, strenuous work or exercise?"     Flipping over in bed 8. MEDICINES: "What have you taken so far for the  pain?" (e.g., nothing, acetaminophen , NSAIDS)     Tylenol  9. NEUROLOGIC SYMPTOMS: "Do you have any weakness, numbness, or problems with bowel/bladder control?"     no 10. OTHER SYMPTOMS: "Do you have any other symptoms?" (e.g., fever, abdomen pain, burning with urination, blood in urine)       no  Protocols used: Back Pain-A-AH

## 2023-11-12 NOTE — Telephone Encounter (Signed)
 Appointment with Dr. Mariam Shingles 11/13/2023 12:00 pm.

## 2023-11-13 ENCOUNTER — Ambulatory Visit
Admission: RE | Admit: 2023-11-13 | Discharge: 2023-11-13 | Disposition: A | Discharge status: Home / Self Care | Source: Ambulatory Visit | Attending: Family Medicine | Admitting: Family Medicine

## 2023-11-13 ENCOUNTER — Ambulatory Visit (INDEPENDENT_AMBULATORY_CARE_PROVIDER_SITE_OTHER): Admitting: Family Medicine

## 2023-11-13 ENCOUNTER — Encounter: Payer: Self-pay | Admitting: Family Medicine

## 2023-11-13 VITALS — BP 126/84 | HR 70 | Temp 97.8°F | Ht 63.5 in | Wt 161.5 lb

## 2023-11-13 DIAGNOSIS — M545 Low back pain, unspecified: Secondary | ICD-10-CM

## 2023-11-13 DIAGNOSIS — M5442 Lumbago with sciatica, left side: Secondary | ICD-10-CM | POA: Diagnosis not present

## 2023-11-13 DIAGNOSIS — M4726 Other spondylosis with radiculopathy, lumbar region: Secondary | ICD-10-CM | POA: Diagnosis not present

## 2023-11-13 MED ORDER — METHOCARBAMOL 500 MG PO TABS: 500.0000 mg | ORAL_TABLET | Freq: Two times a day (BID) | ORAL | 0 refills | Status: AC | PRN

## 2023-11-13 NOTE — Patient Instructions (Addendum)
 Possible herniated disc leading to sciatica.  Low back xray today  May try robaxin  muscle relaxant - caution with sedation, unsteadiness on this medication.  If not improving as expected, let us  know to consider course of physical therapy.  Let us  know if worsening pain or new symptoms such as worsening numbness, weakness, bowel/bladder accidents.

## 2023-11-13 NOTE — Assessment & Plan Note (Signed)
 Acute exacerbation of chronic problem. 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  Limited treatment options - avoiding NSAIDs in blood thinner use, she notes intolerance to prednisone and would like to also avoid steroid injection, avoid stronger pain medication due to possible side effects. Discussed tylenol  dosing for pain relief. Rx low dose robaxin  500mg  (start at 250mg ) which she states she's tolerated well previously. If not improving, low threshold to refer to PT.  If worsening pain, to let us  know to consider advanced imaging.

## 2023-11-13 NOTE — Progress Notes (Signed)
 Ph: [phone] Fax: [phone]   Patient ID: Christyne Crane, female    DOB: [birth year], 73 y.o.   MRN: [id number]  This visit was conducted in person.  BP 126/84   Pulse 70   Temp 97.8 F (36.6 C) (Oral)   Ht 5' 3.5" (1.613 m)   Wt 161 lb 8 oz (73.3 kg)   SpO2 95%   BMI 28.16 kg/m    CC: L low back pain  Subjective:   HPI: YVAINE JANKOWIAK is a 73 y.o. female presenting on 11/13/2023 for Back Pain (C/o L-side low back pain radiating down leg. Started 11/11/23. H/o sciatica but states this pain is different. )   Pleasant 73yo patient of Dr Millie Alm with known HTN, HFrEF, OA s/p R knee replacement 2018 ([facility]), pacemaker, CKD, depression, GERD, afib on eliquis , CAD, arnold chiari malformation, and h/o CVA with mild residual L hemiparesis.   She fell several months ago onto some toys - landed on buttock. No noted injury at that time.   3d h/o sharp stabbing L lower back pain with shooting pain down left buttock to mid shin. Leg feels weaker than normal. Denies inciting trauma/injury or fall. She was using L leg to remove heavy covers on bed. Worst pain is with transitions ie sitting to standing or supine to sitting. No numbness, tingling, bowel/bladder incontinence, saddle anesthesia, fevers/chills. No UTI symptoms.   She cares for great granddaughter on Tues/Thursdays and stays active with this.   Notes prednisone intolerance - racing heart. Wants to avoid this.      Relevant past medical, surgical, family and social history reviewed and updated as indicated. Interim medical history since our last visit reviewed. Allergies and medications reviewed and updated. Outpatient Medications Prior to Visit  Medication Sig Dispense Refill   acetaminophen  (TYLENOL ) 500 MG tablet Take 1,000 mg by mouth 2 (two) times daily as needed (pain).     aspirin  81 MG chewable tablet Chew 1 tablet (81 mg total) by mouth daily. 30 tablet 0   carvedilol  (COREG ) 3.125 MG tablet TAKE 1 TABLET BY  MOUTH TWICE A DAY WITH FOOD 180 tablet 1   Cholecalciferol  (VITAMIN D3 PO) Take 1,000 Units by mouth in the morning and at bedtime.     diphenhydrAMINE  (BENADRYL  CHILDRENS ALLERGY) 12.5 MG/5ML liquid Take 25 mg by mouth at bedtime.     ELIQUIS  5 MG TABS tablet TAKE 1 TABLET BY MOUTH TWICE A DAY 180 tablet 1   ezetimibe  (ZETIA ) 10 MG tablet TAKE 1 TABLET BY MOUTH EVERY DAY 90 tablet 3   famotidine  (PEPCID ) 20 MG tablet Take 1 tablet (20 mg total) by mouth 2 (two) times daily. 60 tablet 3   Magnesium  400 MG TABS Take 400 mg by mouth daily.     rosuvastatin  (CRESTOR ) 10 MG tablet TAKE 1 TABLET (10 MG TOTAL) BY MOUTH DAILY AT 6 PM. 90 tablet 3   sertraline  (ZOLOFT ) 50 MG tablet TAKE 2 TABLETS BY MOUTH AT BEDTIME. 180 tablet 1   vitamin B-12 (CYANOCOBALAMIN ) 1000 MCG tablet Take 1,000 mcg by mouth daily.     No facility-administered medications prior to visit.     Per HPI unless specifically indicated in ROS section below Review of Systems  Objective:  BP 126/84   Pulse 70   Temp 97.8 F (36.6 C) (Oral)   Ht 5' 3.5" (1.613 m)   Wt 161 lb 8 oz (73.3 kg)   SpO2 95%   BMI 28.16 kg/m  Wt Readings from Last 3 Encounters:  11/13/23 161 lb 8 oz (73.3 kg)  09/10/23 160 lb (72.6 kg)  08/03/23 163 lb 3.2 oz (74 kg)      Physical Exam Vitals and nursing note reviewed.  Constitutional:      Appearance: Normal appearance. She is not ill-appearing.     Comments: Ambulates with walker, slowed ambulation due to back pain  Musculoskeletal:     Right lower leg: No edema.     Left lower leg: No edema.     Comments:  No pain midline spine + left lumbar paraspinous mm tenderness to palpation  + SLR on left. No pain with int/ext rotation at hip. No significant pain at SIJ, GTB or sciatic notch bilaterally.   Skin:    General: Skin is warm and dry.     Findings: No erythema or rash.  Neurological:     Mental Status: She is alert.     Sensory: Sensation is intact.     Motor: Weakness  present.     Coordination: Coordination is intact.     Deep Tendon Reflexes:     Reflex Scores:      Patellar reflexes are 3+ on the right side and 3+ on the left side.    Comments:  5/5 strength RLE 4/5 strength LLE as per below Chronic L leg weakness to knee flexion and plantar flexion from stroke Sensation intact  Psychiatric:        Mood and Affect: Mood normal.        Behavior: Behavior normal.       Lab Results  Component Value Date   NA 142 09/14/2023   CL 108 09/14/2023   K 4.0 09/14/2023   CO2 26 09/14/2023   BUN 19 09/14/2023   CREATININE 1.97 (H) 09/14/2023   GFR 24.83 (L) 09/14/2023   CALCIUM  9.2 09/14/2023   PHOS 2.9 08/06/2019   ALBUMIN 4.5 09/14/2023   GLUCOSE 85 09/14/2023   Lab Results  Component Value Date   WBC 6.0 07/30/2020   HGB 11.2 (L) 07/30/2020   HCT 34.5 (L) 07/30/2020   MCV 91.8 07/30/2020   PLT 196 07/30/2020    Assessment & Plan:   Problem List Items Addressed This Visit     Acute left-sided low back pain with left-sided sciatica - Primary   Acute exacerbation of chronic problem. 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  Limited treatment options - avoiding NSAIDs in blood thinner use, she notes intolerance to prednisone and would like to also avoid steroid injection, avoid stronger pain medication due to possible side effects. Discussed tylenol  dosing for pain relief. Rx low dose robaxin  500mg  (start at 250mg ) which she states she's tolerated well previously. If not improving, low threshold to refer to PT.  If worsening pain, to let us  know to consider advanced imaging.       Relevant Medications   methocarbamol  (ROBAXIN ) 500 MG tablet   Other Relevant Orders   DG Lumbar Spine Complete     Meds ordered this encounter  Medications   methocarbamol  (ROBAXIN ) 500 MG tablet    Sig: Take 1 tablet (500 mg total) by mouth 2 (two)  times daily as needed for muscle spasms (sedation precautions).    Dispense:  30 tablet    Refill:  0    Orders Placed This Encounter  Procedures   DG Lumbar Spine Complete  Standing Status:   Future    Number of Occurrences:   1    Expiration Date:   11/12/2024    Reason for Exam (SYMPTOM  OR DIAGNOSIS REQUIRED):   left low back pain with L radiculopathy    Preferred imaging location?:   [facility]    Patient Instructions  Possible herniated disc leading to sciatica.  Low back xray today  May try robaxin  muscle relaxant - caution with sedation, unsteadiness on this medication.  If not improving as expected, let us  know to consider course of physical therapy.  Let us  know if worsening pain or new symptoms such as worsening numbness, weakness, bowel/bladder accidents.   Follow up plan: No follow-ups on file.  Claire Crick, MD

## 2023-11-24 ENCOUNTER — Ambulatory Visit: Payer: Self-pay | Admitting: Family Medicine

## 2023-12-01 NOTE — Progress Notes (Signed)
 Remote ICD transmission.

## 2023-12-07 ENCOUNTER — Other Ambulatory Visit: Payer: Self-pay | Admitting: Cardiology

## 2023-12-14 ENCOUNTER — Encounter: Payer: Self-pay | Admitting: Cardiology

## 2023-12-14 NOTE — Telephone Encounter (Signed)
 No labs if recently done w/ kidney docs - just get the records.   Draysen Weygandt [place], DO, FACC

## 2023-12-15 ENCOUNTER — Other Ambulatory Visit: Payer: Self-pay

## 2023-12-15 DIAGNOSIS — I10 Essential (primary) hypertension: Secondary | ICD-10-CM

## 2023-12-15 DIAGNOSIS — I5032 Chronic diastolic (congestive) heart failure: Secondary | ICD-10-CM

## 2023-12-15 DIAGNOSIS — I428 Other cardiomyopathies: Secondary | ICD-10-CM

## 2023-12-15 DIAGNOSIS — I480 Paroxysmal atrial fibrillation: Secondary | ICD-10-CM

## 2023-12-15 NOTE — Telephone Encounter (Signed)
 H&H, BMP, NTproBNP, and Mg  Jairen Goldfarb [place], DO, FACC

## 2023-12-15 NOTE — Progress Notes (Signed)
 H&H, BMP, NTproBNP, and Mg   Sunit Bosworth, DO, FACC

## 2023-12-21 DIAGNOSIS — I428 Other cardiomyopathies: Secondary | ICD-10-CM | POA: Diagnosis not present

## 2023-12-21 DIAGNOSIS — I10 Essential (primary) hypertension: Secondary | ICD-10-CM | POA: Diagnosis not present

## 2023-12-21 DIAGNOSIS — I480 Paroxysmal atrial fibrillation: Secondary | ICD-10-CM | POA: Diagnosis not present

## 2023-12-21 DIAGNOSIS — I5032 Chronic diastolic (congestive) heart failure: Secondary | ICD-10-CM | POA: Diagnosis not present

## 2023-12-22 ENCOUNTER — Ambulatory Visit: Payer: Self-pay | Admitting: Cardiology

## 2023-12-22 LAB — HEMOGLOBIN AND HEMATOCRIT, BLOOD
Hematocrit: 37.4 % (ref 34.0–46.6)
Hemoglobin: 12.1 g/dL (ref 11.1–15.9)

## 2023-12-22 LAB — BASIC METABOLIC PANEL WITH GFR
BUN/Creatinine Ratio: 10 — ABNORMAL LOW (ref 12–28)
BUN: 16 mg/dL (ref 8–27)
CO2: 21 mmol/L (ref 20–29)
Calcium: 8.8 mg/dL (ref 8.7–10.3)
Chloride: 105 mmol/L (ref 96–106)
Creatinine, Ser: 1.62 mg/dL — ABNORMAL HIGH (ref 0.57–1.00)
Glucose: 89 mg/dL (ref 70–99)
Potassium: 4.5 mmol/L (ref 3.5–5.2)
Sodium: 142 mmol/L (ref 134–144)
eGFR: 33 mL/min/1.73 — ABNORMAL LOW (ref >59)

## 2023-12-22 LAB — MAGNESIUM: Magnesium: 2.6 mg/dL — ABNORMAL HIGH (ref 1.6–2.3)

## 2023-12-22 LAB — PRO B NATRIURETIC PEPTIDE: NT-Pro BNP: 1508 pg/mL — ABNORMAL HIGH (ref 0–301)

## 2023-12-31 ENCOUNTER — Other Ambulatory Visit: Payer: Self-pay

## 2023-12-31 ENCOUNTER — Encounter: Payer: Self-pay | Admitting: Cardiology

## 2023-12-31 ENCOUNTER — Ambulatory Visit: Attending: Cardiology | Admitting: Cardiology

## 2023-12-31 VITALS — BP 155/79 | HR 79 | Resp 16 | Ht 63.0 in | Wt 164.4 lb

## 2023-12-31 DIAGNOSIS — E782 Mixed hyperlipidemia: Secondary | ICD-10-CM

## 2023-12-31 DIAGNOSIS — I5032 Chronic diastolic (congestive) heart failure: Secondary | ICD-10-CM

## 2023-12-31 DIAGNOSIS — Z95810 Presence of automatic (implantable) cardiac defibrillator: Secondary | ICD-10-CM

## 2023-12-31 DIAGNOSIS — I480 Paroxysmal atrial fibrillation: Secondary | ICD-10-CM | POA: Diagnosis not present

## 2023-12-31 DIAGNOSIS — Z8679 Personal history of other diseases of the circulatory system: Secondary | ICD-10-CM | POA: Diagnosis not present

## 2023-12-31 DIAGNOSIS — Z8674 Personal history of sudden cardiac arrest: Secondary | ICD-10-CM

## 2023-12-31 DIAGNOSIS — Z87891 Personal history of nicotine dependence: Secondary | ICD-10-CM

## 2023-12-31 DIAGNOSIS — I428 Other cardiomyopathies: Secondary | ICD-10-CM

## 2023-12-31 DIAGNOSIS — I447 Left bundle-branch block, unspecified: Secondary | ICD-10-CM

## 2023-12-31 DIAGNOSIS — Z7901 Long term (current) use of anticoagulants: Secondary | ICD-10-CM

## 2023-12-31 DIAGNOSIS — Z8673 Personal history of transient ischemic attack (TIA), and cerebral infarction without residual deficits: Secondary | ICD-10-CM | POA: Diagnosis not present

## 2023-12-31 MED ORDER — TORSEMIDE 20 MG PO TABS: 20.0000 mg | ORAL_TABLET | Freq: Every day | ORAL | 3 refills | Status: AC

## 2023-12-31 NOTE — Patient Instructions (Signed)
 Medication Instructions:  Your physician has recommended you make the following change in your medication:   1-START Torsemide  10 mg by mouth every morning.  *If you need a refill on your cardiac medications before your next appointment, please call your pharmacy*  Lab Work: Your physician recommends that you return for lab work in:  1 week for BMET and BNP If you have labs (blood work) drawn today and your tests are completely normal, you will receive your results only by: MyChart Message (if you have MyChart) OR A paper copy in the mail If you have any lab test that is abnormal or we need to change your treatment, we will call you to review the results.  Testing/Procedures: None ordered today.  Follow-Up: At [facility], you and your health needs are our priority.  As part of our continuing mission to provide you with exceptional heart care, our providers are all part of one team.  This team includes your primary Cardiologist (physician) and Advanced Practice Providers or APPs (Physician Assistants and Nurse Practitioners) who all work together to provide you with the care you need, when you need it.  Your next appointment:   6 month(s)  Provider:   Madonna Large, DO    We recommend signing up for the patient portal called MyChart.  Sign up information is provided on this After Visit Summary.  MyChart is used to connect with patients for Virtual Visits (Telemedicine).  Patients are able to view lab/test results, encounter notes, upcoming appointments, etc.  Non-urgent messages can be sent to your provider as well.   To learn more about what you can do with MyChart, go to [email or web address].

## 2023-12-31 NOTE — Progress Notes (Unsigned)
 Cardiology Office Note:  .   Date:  01/01/2024  ID:  [name], DOB [birth year], MRN [id number] PCP:  Avelina Greig BRAVO, MD  Former Cardiology Providers: None [facility] Providers Cardiologist:  Madonna Large, DO, [facility]   Electrophysiologist:  Will Gladis Norton, MD  Electrophysiologist:  Will Gladis Norton, MD  Click to update primary MD,subspecialty MD or APP then REFRESH:1}    Chief Complaint  Patient presents with   Heart failure with improved ejection fraction   Follow-up    History of Present Illness: .   [name] is a 73 y.o. Caucasian female whose past medical history and cardiovascular risk factors includes: Hypertension, hyperlipidemia, former smoker, history of Arnold-Chiari malformation, history of VT/VF, history of torsades, cardiac arrest, nonischemic cardiomyopathy & heart failure with improved EF, peripheral vascular disease, paroxysmal atrial fibrillation, left bundle branch block, history of CVA.   In 2021 she presented to the ED with V. tach/V-fib arrest due to torsades during the same hospitalization she was diagnosed with nonischemic cardiomyopathy and eventually went home with a LifeVest which successfully defibrillated her due to a V-fib event.  She subsequently underwent BiV ICD implant.  In the past her GDMT was uptitrated, but limited due to chronic kidney disease and hyperkalemia.  After down titrating her medications clinically patient improved with regards to more stable renal function, increased appetite, remained euvolemic on Lasix  20 mg p.o. twice daily.  Repeat echocardiogram to reevaluate LVEF noted improvement from 20-25% to 45-50%.  She presents today for follow-up. Accompanied by her daughter at bedside. Denies anginal chest pain or heart failure symptoms.   Since last office visit her furosemide  20 mg p.o. twice daily has been discontinued by her nephrologist. Clinically denies heart failure symptoms.  Volume status trending up. Her  most recent remote device check from May 2025 reviewed as part of today's visit Blood pressure at today's office visit is not well-controlled, she does not check it at home.  Review of Systems: .   Review of Systems  Cardiovascular:  Negative for chest pain, claudication, irregular heartbeat, leg swelling, near-syncope, orthopnea, palpitations, paroxysmal nocturnal dyspnea and syncope.  Respiratory:  Negative for shortness of breath.   Hematologic/Lymphatic: Negative for bleeding problem.    Studies Reviewed:   EKG: EKG Interpretation Date/Time:  Thursday December 31 2023 14:09:37 EDT Ventricular Rate:  75 PR Interval:  198 QRS Duration:  106 QT Interval:  420 QTC Calculation: 469 R Axis:   -68  Text Interpretation: AV dual-paced rhythm When compared with ECG of 05-Jun-2023 14:56, Vent. rate has decreased BY  14 BPM Confirmed by Large Madonna [id number]) on 12/31/2023 2:39:28 PM  Echocardiogram: 02/28/2020: LVEF 25-30%  July 2023: LVEF 35-40%.  See report for additional details  December 2024 LVEF: 45-50% Diastolic Function: Grade 1 See report for additional details  Heart Catheterization: 08/04/19:  LV: Global hypokinesis, upper limit of normal size, EF 25 to 30%. Left main: Normal. LAD: Mild diffuse disease.  Proximal LAD has a 30 to 40% stenosis, mid segment has a 20 to 30% stenosis, scattered disease noted in the LAD.  Brisk flow. Circumflex: Again scattered disease noted in the circumflex.  Mid segment has at most a 30 to 40% stenosis which appears to be eccentric and calcified. RCA: Dominant.  Mild diffuse disease again noted.  Mid segment after the origin of RV branch has a 70 to 80% stenosis.  Brisk flow is evident throughout the RCA.  Impression: Findings consistent with nonischemic cardiomyopathy.  Although she has significant disease in the right coronary artery, this does not explain her presentation with global hypokinesis, neither does it explain VF arrest as the lesion  does not appear to be unstable.   Carotid artery duplex 01/12/2023: Duplex suggests stenosis in the right internal carotid artery (minimal). Duplex suggests stenosis in the left internal carotid artery (minimal). Antegrade right vertebral artery flow. Antegrade left vertebral artery flow.    RADIOLOGY: N/A  Risk Assessment/Calculations:   Click Here to Calculate/Change CHADS2VASc Score The patient's CHADS2-VASc score is 7, indicating a 11.2% annual risk of stroke. CHF History: Yes HTN History: Yes Diabetes History: No Stroke History: Yes Vascular Disease History: Yes  Labs:       Latest Ref Rng & Units 12/21/2023    3:00 PM 07/30/2020    5:04 PM 06/12/2020    2:56 AM  CBC  WBC 4.0 - 10.5 K/uL  6.0  6.0   Hemoglobin 11.1 - 15.9 g/dL 87.8  88.7  89.3   Hematocrit 34.0 - 46.6 % 37.4  34.5  32.4   Platelets 150 - 400 K/uL  196  173        Latest Ref Rng & Units 12/21/2023    3:00 PM 09/14/2023    8:52 AM 04/10/2023    1:59 PM  BMP  Glucose 70 - 99 mg/dL 89  85  864   BUN 8 - 27 mg/dL 16  19  16    Creatinine 0.57 - 1.00 mg/dL 8.37  8.02  8.10   BUN/Creat Ratio 12 - 28 10   8    Sodium 134 - 144 mmol/L 142  142  141   Potassium 3.5 - 5.2 mmol/L 4.5  4.0  3.7   Chloride 96 - 106 mmol/L 105  108  105   CO2 20 - 29 mmol/L 21  26    Calcium  8.7 - 10.3 mg/dL 8.8  9.2  8.9       Latest Ref Rng & Units 12/21/2023    3:00 PM 09/14/2023    8:52 AM 04/10/2023    1:59 PM  CMP  Glucose 70 - 99 mg/dL 89  85  864   BUN 8 - 27 mg/dL 16  19  16    Creatinine 0.57 - 1.00 mg/dL 8.37  8.02  8.10   Sodium 134 - 144 mmol/L 142  142  141   Potassium 3.5 - 5.2 mmol/L 4.5  4.0  3.7   Chloride 96 - 106 mmol/L 105  108  105   CO2 20 - 29 mmol/L 21  26    Calcium  8.7 - 10.3 mg/dL 8.8  9.2  8.9   Total Protein 6.0 - 8.3 g/dL  7.3  6.8   Total Bilirubin 0.2 - 1.2 mg/dL  0.4  0.3   Alkaline Phos 39 - 117 U/L  61  64   AST 0 - 37 U/L  19  18   ALT 0 - 35 U/L  7      Lab Results  Component  Value Date   CHOL 125 09/14/2023   HDL 40.90 09/14/2023   LDLCALC 57 09/14/2023   LDLDIRECT 193.0 09/02/2016   TRIG 138.0 09/14/2023   CHOLHDL 3 09/14/2023   No results for input(s): LIPOA in the last 8760 hours. No components found for: NTPROBNP Recent Labs    04/10/23 1358 12/21/23 1500  PROBNP 520* 1,508*   No results for input(s): TSH in the last 8760 hours.  Physical Exam:  Today's Vitals   12/31/23 1405  BP: (!) 155/79  Pulse: 79  Resp: 16  SpO2: 97%  Weight: 164 lb 6.4 oz (74.6 kg)  Height: 5' 3 (1.6 m)   Body mass index is 29.12 kg/m. Wt Readings from Last 3 Encounters:  12/31/23 164 lb 6.4 oz (74.6 kg)  11/13/23 161 lb 8 oz (73.3 kg)  09/10/23 160 lb (72.6 kg)    Physical Exam  Constitutional: No distress.  Appears older than stated age, hemodynamically stable.   Neck: No JVD present.  Cardiovascular: Normal rate, regular rhythm, S1 normal, S2 normal and intact distal pulses. Exam reveals no gallop, no S3 and no S4.  Murmur heard. Holosystolic murmur is present with a grade of 3/6 at the apex radiating to the axilla. Pulses:      Femoral pulses are 2+ on the right side and 1+ on the left side. Pulmonary/Chest: Effort normal and breath sounds normal. No stridor. She has no wheezes. She has no rales.  BiV ICD noted left infraclavicular region.  Abdominal: Soft. Bowel sounds are normal. She exhibits no distension. There is no abdominal tenderness.  Musculoskeletal:        General: No edema.     Cervical back: Neck supple.  Neurological: She is alert and oriented to person, place, and time. She has intact cranial nerves (2-12).  Skin: Skin is warm and moist.     Impression & Recommendation(s):  Impression:   ICD-10-CM   1. Nonischemic cardiomyopathy (HCC)  I42.8 torsemide  (DEMADEX ) 20 MG tablet    Pro b natriuretic peptide (BNP)    Basic metabolic panel with GFR    2. Heart failure with improved ejection fraction (HFimpEF) (HCC)  I50.32 EKG  12-Lead    torsemide  (DEMADEX ) 20 MG tablet    Pro b natriuretic peptide (BNP)    Basic metabolic panel with GFR    3. Biventricular ICD (implantable cardioverter-defibrillator) in place  Z95.810 torsemide  (DEMADEX ) 20 MG tablet    Pro b natriuretic peptide (BNP)    Basic metabolic panel with GFR    4. Left bundle branch block  I44.7     5. History of cardiac arrest  Z86.74     6. History of torsades de pointes  Z86.79     7. Paroxysmal atrial fibrillation (HCC)  I48.0     8. Long term current use of anticoagulant  Z79.01     9. Mixed hyperlipidemia  E78.2     10. History of CVA (cerebrovascular accident)  Z72.73     11. Former smoker  Z87.891        Recommendation(s):  Heart failure with improved ejection fraction (HFimpEF) (HCC) Nonischemic cardiomyopathy (HCC) Biventricular ICD (implantable cardioverter-defibrillator) in place Stage C, NYHA class II Euvolemic on physical examination. Last hospitalization for CHF in February 2022. In the past GDMT limited due to chronic kidney disease, hyperkalemia. Since last office visit her original dose of Lasix  20 mg twice daily has been discontinued, by nephrology according to the patient. Routine blood work notes uptrend in NT proBNP and her blood pressures are also not well-controlled. Recommended uptitration of GDMT as renal function allows. Will start torsemide  10 mg p.o. daily with follow-up labs in 1 week. If renal function remains stable would like to reintroduce ARB for both GDMT and renal protection. Patient agreeable with the plan of care and understands close monitoring of renal function. Continue carvedilol  3.125 mg p.o. twice daily Continue lipid-lowering agents.  Paroxysmal atrial fibrillation (HCC) Long term  current use of anticoagulant Rate control: Carvedilol . Rhythm control: N/A. Thromboembolic prophylaxis: Eliquis . She does not endorse evidence of bleeding. EKG today shows atrially ventricular paced  rhythm Risks, benefits, and alternatives to anticoagulation discussed.  Mixed hyperlipidemia Currently on Crestor  10 mg p.o. daily and Zetia  10 mg p.o. daily.   She denies myalgia or other side effects. Recently had lipids checked in March 2025, independently reviewed LDL 57 mg/dL and triglycerides 861 mg/dL   Orders Placed:  Orders Placed This Encounter  Procedures   Pro b natriuretic peptide (BNP)    Standing Status:   Future    Number of Occurrences:   1    Expected Date:   12/31/2023    Expiration Date:   12/30/2024   Basic metabolic panel with GFR    Standing Status:   Future    Number of Occurrences:   1    Expected Date:   12/31/2023    Expiration Date:   12/30/2024   EKG 12-Lead   Final Medication List:    Meds ordered this encounter  Medications   torsemide  (DEMADEX ) 20 MG tablet    Sig: Take 1 tablet (20 mg total) by mouth daily.    Dispense:  90 tablet    Refill:  3    Medications Discontinued During This Encounter  Medication Reason   Magnesium  400 MG TABS Patient Preference     Current Outpatient Medications:    acetaminophen  (TYLENOL ) 500 MG tablet, Take 1,000 mg by mouth 2 (two) times daily as needed (pain)., Disp: , Rfl:    aspirin  81 MG chewable tablet, Chew 1 tablet (81 mg total) by mouth daily., Disp: 30 tablet, Rfl: 0   carvedilol  (COREG ) 3.125 MG tablet, TAKE 1 TABLET BY MOUTH TWICE A DAY WITH FOOD, Disp: 180 tablet, Rfl: 2   Cholecalciferol  (VITAMIN D3 PO), Take 1,000 Units by mouth in the morning and at bedtime., Disp: , Rfl:    diphenhydrAMINE  (BENADRYL  CHILDRENS ALLERGY) 12.5 MG/5ML liquid, Take 25 mg by mouth at bedtime., Disp: , Rfl:    ELIQUIS  5 MG TABS tablet, TAKE 1 TABLET BY MOUTH TWICE A DAY, Disp: 180 tablet, Rfl: 1   ezetimibe  (ZETIA ) 10 MG tablet, TAKE 1 TABLET BY MOUTH EVERY DAY, Disp: 90 tablet, Rfl: 3   famotidine  (PEPCID ) 20 MG tablet, Take 1 tablet (20 mg total) by mouth 2 (two) times daily., Disp: 60 tablet, Rfl: 3   methocarbamol   (ROBAXIN ) 500 MG tablet, Take 1 tablet (500 mg total) by mouth 2 (two) times daily as needed for muscle spasms (sedation precautions)., Disp: 30 tablet, Rfl: 0   rosuvastatin  (CRESTOR ) 10 MG tablet, TAKE 1 TABLET (10 MG TOTAL) BY MOUTH DAILY AT 6 PM., Disp: 90 tablet, Rfl: 3   sertraline  (ZOLOFT ) 50 MG tablet, TAKE 2 TABLETS BY MOUTH AT BEDTIME., Disp: 180 tablet, Rfl: 1   torsemide  (DEMADEX ) 20 MG tablet, Take 1 tablet (20 mg total) by mouth daily., Disp: 90 tablet, Rfl: 3   vitamin B-12 (CYANOCOBALAMIN ) 1000 MCG tablet, Take 1,000 mcg by mouth daily., Disp: , Rfl:   Consent:   N/A  Disposition:   51-month follow-up sooner if needed  Her questions and concerns were addressed to her satisfaction. She voices understanding of the recommendations provided during this encounter.    Signed, Madonna Michele HAS, [facility]  A Division of [facility] [address], [place], [place] [address]  [place], [place] [address]  01/01/2024

## 2024-01-01 ENCOUNTER — Encounter: Payer: Self-pay | Admitting: Cardiology

## 2024-01-01 NOTE — Progress Notes (Incomplete)
 Cardiology Office Note:  .   Date:  01/01/2024  ID:  [name], DOB 1950/08/15, MRN [id number] PCP:  Avelina Greig BRAVO, MD  Former Cardiology Providers: None [facility] Providers Cardiologist:  Madonna Large, DO, [facility]   Electrophysiologist:  Will Gladis Norton, MD  Electrophysiologist:  Will Gladis Norton, MD  Click to update primary MD,subspecialty MD or APP then REFRESH:1}    Chief Complaint  Patient presents with  . Heart failure with improved ejection fraction  . Follow-up    History of Present Illness: .   [name] is a 73 y.o. Caucasian female whose past medical history and cardiovascular risk factors includes: Hypertension, hyperlipidemia, former smoker, history of Arnold-Chiari malformation, history of VT/VF, history of torsades, cardiac arrest, nonischemic cardiomyopathy & heart failure with improved EF, peripheral vascular disease, paroxysmal atrial fibrillation, left bundle branch block, history of CVA.   In 2021 she presented to the ED with V. tach/V-fib arrest due to torsades during the same hospitalization she was diagnosed with nonischemic cardiomyopathy and eventually went home with a LifeVest which successfully defibrillated her due to a V-fib event.  She subsequently underwent BiV ICD implant.  In the past her GDMT was uptitrated, but limited due to chronic kidney disease and hyperkalemia.  After down titrating her medications clinically patient improved with regards to more stable renal function, increased appetite, remained euvolemic on Lasix  20 mg p.o. twice daily.  Repeat echocardiogram to reevaluate LVEF noted improvement from 20-25% to 45-50%.  She presents today for follow-up. Accompanied by her daughter at bedside. Denies anginal chest pain or heart failure symptoms.   Since last office visit her furosemide  20 mg p.o. twice daily has been discontinued by her nephrologist. Clinically denies heart failure symptoms.  Volume status trending  up. Her most recent remote device check from May 2025 reviewed as part of today's visit Blood pressure at today's office visit is not well-controlled, she does not check it at home.  Review of Systems: .   Review of Systems  Cardiovascular:  Negative for chest pain, claudication, irregular heartbeat, leg swelling, near-syncope, orthopnea, palpitations, paroxysmal nocturnal dyspnea and syncope.  Respiratory:  Negative for shortness of breath.   Hematologic/Lymphatic: Negative for bleeding problem.    Studies Reviewed:   EKG: EKG Interpretation Date/Time:  Thursday December 31 2023 14:09:37 EDT Ventricular Rate:  75 PR Interval:  198 QRS Duration:  106 QT Interval:  420 QTC Calculation: 469 R Axis:   -68  Text Interpretation: AV dual-paced rhythm When compared with ECG of 05-Jun-2023 14:56, Vent. rate has decreased BY  14 BPM Confirmed by Large Madonna [phone]) on 12/31/2023 2:39:28 PM  Echocardiogram: 02/28/2020: LVEF 25-30%  July 2023: LVEF 35-40%.  See report for additional details  December 2024 LVEF: 45-50% Diastolic Function: Grade 1 See report for additional details  Heart Catheterization: 08/04/19:  LV: Global hypokinesis, upper limit of normal size, EF 25 to 30%. Left main: Normal. LAD: Mild diffuse disease.  Proximal LAD has a 30 to 40% stenosis, mid segment has a 20 to 30% stenosis, scattered disease noted in the LAD.  Brisk flow. Circumflex: Again scattered disease noted in the circumflex.  Mid segment has at most a 30 to 40% stenosis which appears to be eccentric and calcified. RCA: Dominant.  Mild diffuse disease again noted.  Mid segment after the origin of RV branch has a 70 to 80% stenosis.  Brisk flow is evident throughout the RCA.  Impression: Findings consistent with nonischemic cardiomyopathy.  Although she has significant disease in the right coronary artery, this does not explain her presentation with global hypokinesis, neither does it explain VF arrest as the  lesion does not appear to be unstable.   Carotid artery duplex 01/12/2023: Duplex suggests stenosis in the right internal carotid artery (minimal). Duplex suggests stenosis in the left internal carotid artery (minimal). Antegrade right vertebral artery flow. Antegrade left vertebral artery flow.    RADIOLOGY: N/A  Risk Assessment/Calculations:   Click Here to Calculate/Change CHADS2VASc Score The patient's CHADS2-VASc score is 7, indicating a 11.2% annual risk of stroke. CHF History: Yes HTN History: Yes Diabetes History: No Stroke History: Yes Vascular Disease History: Yes  Labs:       Latest Ref Rng & Units 12/21/2023    3:00 PM 07/30/2020    5:04 PM 06/12/2020    2:56 AM  CBC  WBC 4.0 - 10.5 K/uL  6.0  6.0   Hemoglobin 11.1 - 15.9 g/dL 87.8  88.7  89.3   Hematocrit 34.0 - 46.6 % 37.4  34.5  32.4   Platelets 150 - 400 K/uL  196  173        Latest Ref Rng & Units 12/21/2023    3:00 PM 09/14/2023    8:52 AM 04/10/2023    1:59 PM  BMP  Glucose 70 - 99 mg/dL 89  85  864   BUN 8 - 27 mg/dL 16  19  16    Creatinine 0.57 - 1.00 mg/dL 8.37  8.02  8.10   BUN/Creat Ratio 12 - 28 10   8    Sodium 134 - 144 mmol/L 142  142  141   Potassium 3.5 - 5.2 mmol/L 4.5  4.0  3.7   Chloride 96 - 106 mmol/L 105  108  105   CO2 20 - 29 mmol/L 21  26    Calcium  8.7 - 10.3 mg/dL 8.8  9.2  8.9       Latest Ref Rng & Units 12/21/2023    3:00 PM 09/14/2023    8:52 AM 04/10/2023    1:59 PM  CMP  Glucose 70 - 99 mg/dL 89  85  864   BUN 8 - 27 mg/dL 16  19  16    Creatinine 0.57 - 1.00 mg/dL 8.37  8.02  8.10   Sodium 134 - 144 mmol/L 142  142  141   Potassium 3.5 - 5.2 mmol/L 4.5  4.0  3.7   Chloride 96 - 106 mmol/L 105  108  105   CO2 20 - 29 mmol/L 21  26    Calcium  8.7 - 10.3 mg/dL 8.8  9.2  8.9   Total Protein 6.0 - 8.3 g/dL  7.3  6.8   Total Bilirubin 0.2 - 1.2 mg/dL  0.4  0.3   Alkaline Phos 39 - 117 U/L  61  64   AST 0 - 37 U/L  19  18   ALT 0 - 35 U/L  7      Lab Results   Component Value Date   CHOL 125 09/14/2023   HDL 40.90 09/14/2023   LDLCALC 57 09/14/2023   LDLDIRECT 193.0 09/02/2016   TRIG 138.0 09/14/2023   CHOLHDL 3 09/14/2023   No results for input(s): LIPOA in the last 8760 hours. No components found for: NTPROBNP Recent Labs    04/10/23 1358 12/21/23 1500  PROBNP 520* 1,508*   No results for input(s): TSH in the last 8760 hours.  Physical Exam:  Today's Vitals   12/31/23 1405  BP: (!) 155/79  Pulse: 79  Resp: 16  SpO2: 97%  Weight: 164 lb 6.4 oz (74.6 kg)  Height: 5' 3 (1.6 m)   Body mass index is 29.12 kg/m. Wt Readings from Last 3 Encounters:  12/31/23 164 lb 6.4 oz (74.6 kg)  11/13/23 161 lb 8 oz (73.3 kg)  09/10/23 160 lb (72.6 kg)    Physical Exam  Constitutional: No distress.  Appears older than stated age, hemodynamically stable.   Neck: No JVD present.  Cardiovascular: Normal rate, regular rhythm, S1 normal, S2 normal and intact distal pulses. Exam reveals no gallop, no S3 and no S4.  Murmur heard. Holosystolic murmur is present with a grade of 3/6 at the apex radiating to the axilla. Pulses:      Femoral pulses are 2+ on the right side and 1+ on the left side. Pulmonary/Chest: Effort normal and breath sounds normal. No stridor. She has no wheezes. She has no rales.  BiV ICD noted left infraclavicular region.  Abdominal: Soft. Bowel sounds are normal. She exhibits no distension. There is no abdominal tenderness.  Musculoskeletal:        General: No edema.     Cervical back: Neck supple.  Neurological: She is alert and oriented to person, place, and time. She has intact cranial nerves (2-12).  Skin: Skin is warm and moist.     Impression & Recommendation(s):  Impression:   ICD-10-CM   1. Nonischemic cardiomyopathy (HCC)  I42.8 torsemide  (DEMADEX ) 20 MG tablet    Pro b natriuretic peptide (BNP)    Basic metabolic panel with GFR    2. Heart failure with improved ejection fraction (HFimpEF) (HCC)   I50.32 EKG 12-Lead    torsemide  (DEMADEX ) 20 MG tablet    Pro b natriuretic peptide (BNP)    Basic metabolic panel with GFR    3. Biventricular ICD (implantable cardioverter-defibrillator) in place  Z95.810 torsemide  (DEMADEX ) 20 MG tablet    Pro b natriuretic peptide (BNP)    Basic metabolic panel with GFR    4. Left bundle branch block  I44.7     5. History of cardiac arrest  Z86.74     6. History of torsades de pointes  Z86.79     7. Paroxysmal atrial fibrillation (HCC)  I48.0     8. Long term current use of anticoagulant  Z79.01     9. Mixed hyperlipidemia  E78.2     10. History of CVA (cerebrovascular accident)  Z81.73     11. Former smoker  Z87.891        Recommendation(s):  Heart failure with improved ejection fraction (HFimpEF) (HCC) Nonischemic cardiomyopathy (HCC) Biventricular ICD (implantable cardioverter-defibrillator) in place Stage C, NYHA class II Euvolemic on physical examination. Last hospitalization for congestive heart failure in February 2022. She remains stable on the limited GDMT that she can tolerate given her chronic disease stage IV. Labs from October 2024 independently reviewed which note some improvement in renal function and NT proBNP is also trended down.. Echocardiogram from December 2024 notes improvement in LVEF to 45-50%. Patient states that nephrology is requesting down titration of diuretic therapy.  I think this is worth attempting given her chronic kidney disease.  However if this occurs I have asked her to check her blood pressures and weight on regular basis.  If her weight starts trending up averaging more than 1 pound over 24 hours or 3 pounds over a week then restarting diuretics to maintain  euvolemia on as needed basis is reasonable. Continue carvedilol  3.125 mg p.o. twice daily. Continue Lasix  20 mg p.o. twice daily.  Paroxysmal atrial fibrillation (HCC) Long term current use of anticoagulant Rate control: Carvedilol . Rhythm  control: N/A. Thromboembolic prophylaxis: Eliquis . At the current time Eliquis  is dosed appropriately. She does not endorse evidence of bleeding. EKG today shows atrially ventricular paced rhythm  Mixed hyperlipidemia Currently on Crestor  10 mg p.o. daily and Zetia  10 mg p.o. daily.   She denies myalgia or other side effects. No recent lipid profile available for review.   Currently managed by primary care provider.  Orders Placed:  Orders Placed This Encounter  Procedures  . Pro b natriuretic peptide (BNP)    Standing Status:   Future    Number of Occurrences:   1    Expected Date:   12/31/2023    Expiration Date:   12/30/2024  . Basic metabolic panel with GFR    Standing Status:   Future    Number of Occurrences:   1    Expected Date:   12/31/2023    Expiration Date:   12/30/2024  . EKG 12-Lead   During today's office visit discussed management of at least 2 chronic comorbid conditions. Most recent echocardiogram from December 2024 independently reviewed and noted above for further reference. EKG today independently reviewed Patient requesting samples for Eliquis . Will make sure she is enrolled into the device clinic for longitudinal follow-up.  Final Medication List:    Meds ordered this encounter  Medications  . torsemide  (DEMADEX ) 20 MG tablet    Sig: Take 1 tablet (20 mg total) by mouth daily.    Dispense:  90 tablet    Refill:  3    Medications Discontinued During This Encounter  Medication Reason  . Magnesium  400 MG TABS Patient Preference     Current Outpatient Medications:  .  acetaminophen  (TYLENOL ) 500 MG tablet, Take 1,000 mg by mouth 2 (two) times daily as needed (pain)., Disp: , Rfl:  .  aspirin  81 MG chewable tablet, Chew 1 tablet (81 mg total) by mouth daily., Disp: 30 tablet, Rfl: 0 .  carvedilol  (COREG ) 3.125 MG tablet, TAKE 1 TABLET BY MOUTH TWICE A DAY WITH FOOD, Disp: 180 tablet, Rfl: 2 .  Cholecalciferol  (VITAMIN D3 PO), Take 1,000 Units by mouth  in the morning and at bedtime., Disp: , Rfl:  .  diphenhydrAMINE  (BENADRYL  CHILDRENS ALLERGY) 12.5 MG/5ML liquid, Take 25 mg by mouth at bedtime., Disp: , Rfl:  .  ELIQUIS  5 MG TABS tablet, TAKE 1 TABLET BY MOUTH TWICE A DAY, Disp: 180 tablet, Rfl: 1 .  ezetimibe  (ZETIA ) 10 MG tablet, TAKE 1 TABLET BY MOUTH EVERY DAY, Disp: 90 tablet, Rfl: 3 .  famotidine  (PEPCID ) 20 MG tablet, Take 1 tablet (20 mg total) by mouth 2 (two) times daily., Disp: 60 tablet, Rfl: 3 .  methocarbamol  (ROBAXIN ) 500 MG tablet, Take 1 tablet (500 mg total) by mouth 2 (two) times daily as needed for muscle spasms (sedation precautions)., Disp: 30 tablet, Rfl: 0 .  rosuvastatin  (CRESTOR ) 10 MG tablet, TAKE 1 TABLET (10 MG TOTAL) BY MOUTH DAILY AT 6 PM., Disp: 90 tablet, Rfl: 3 .  sertraline  (ZOLOFT ) 50 MG tablet, TAKE 2 TABLETS BY MOUTH AT BEDTIME., Disp: 180 tablet, Rfl: 1 .  torsemide  (DEMADEX ) 20 MG tablet, Take 1 tablet (20 mg total) by mouth daily., Disp: 90 tablet, Rfl: 3 .  vitamin B-12 (CYANOCOBALAMIN ) 1000 MCG tablet, Take 1,000 mcg by  mouth daily., Disp: , Rfl:   Consent:   N/A  Disposition:   66-month follow-up sooner if needed Patient may be asked to follow-up sooner based on the results of the above-mentioned testing.  Her questions and concerns were addressed to her satisfaction. She voices understanding of the recommendations provided during this encounter.    Signed, Madonna Large, DO, [facility]  [facility]  [address] [place], [place] [address] 01/01/2024 12:04 AM

## 2024-01-03 ENCOUNTER — Encounter: Payer: Self-pay | Admitting: Cardiology

## 2024-01-04 NOTE — Telephone Encounter (Signed)
 I spoke with patient's daughter.  She reports patient always gets a drunk feeling when starting new medications and this is very common for her.  States patient is very susceptible to medications.  Daughter saw patient yesterday afternoon and she was not having any chest pain at that time.  She has not seen patient today and asked me to contact her regarding how she is feeling today. Daughter is asking if patient could go back to furosemide .  I let her know furosemide  was discontinued by nephrology.  Also need to confirm dose of torsemide .  Daughter thinks patient has 20 mg tablets.  I placed call to patient to follow up and left message to call office.

## 2024-01-04 NOTE — Telephone Encounter (Signed)
 Keven,  Follow up w/ the DAUGHTER a lot of going back and forth.  She should be on Torsemide  10mg  po qAM and labs in one week.  This was done as her BNP was trending up and keep in mind her renal function.   Frayda Egley Troy, DO, FACC

## 2024-01-04 NOTE — Telephone Encounter (Signed)
 Patient stated she felt like she has bad heart burn/indigestion. Patient stated she when she first stated the medication she did not feel right, but yesterday and today she feels fine. Please advise.

## 2024-01-04 NOTE — Telephone Encounter (Signed)
 Spoke with pt's daughter, DPR who confirms pt is tolerating medication without any problems now and wishes for Dr Michele to disregard previous messages. Pt's daughter advised will forward to make Dr Michele and his RN aware.  Pt 's daughter thanked [employer] fundraiser for the call.

## 2024-01-05 ENCOUNTER — Ambulatory Visit: Payer: Medicare HMO

## 2024-01-05 NOTE — Progress Notes (Unsigned)
 This encounter was created in error - please disregard.

## 2024-01-08 ENCOUNTER — Other Ambulatory Visit: Payer: Self-pay | Admitting: Family Medicine

## 2024-01-08 DIAGNOSIS — Z8674 Personal history of sudden cardiac arrest: Secondary | ICD-10-CM | POA: Diagnosis not present

## 2024-01-08 DIAGNOSIS — I447 Left bundle-branch block, unspecified: Secondary | ICD-10-CM | POA: Diagnosis not present

## 2024-01-08 DIAGNOSIS — Z8679 Personal history of other diseases of the circulatory system: Secondary | ICD-10-CM | POA: Diagnosis not present

## 2024-01-08 DIAGNOSIS — Z95810 Presence of automatic (implantable) cardiac defibrillator: Secondary | ICD-10-CM | POA: Diagnosis not present

## 2024-01-08 DIAGNOSIS — Z87891 Personal history of nicotine dependence: Secondary | ICD-10-CM | POA: Diagnosis not present

## 2024-01-08 DIAGNOSIS — Z8673 Personal history of transient ischemic attack (TIA), and cerebral infarction without residual deficits: Secondary | ICD-10-CM | POA: Diagnosis not present

## 2024-01-08 DIAGNOSIS — I480 Paroxysmal atrial fibrillation: Secondary | ICD-10-CM | POA: Diagnosis not present

## 2024-01-08 DIAGNOSIS — E782 Mixed hyperlipidemia: Secondary | ICD-10-CM | POA: Diagnosis not present

## 2024-01-08 DIAGNOSIS — I5032 Chronic diastolic (congestive) heart failure: Secondary | ICD-10-CM | POA: Diagnosis not present

## 2024-01-08 DIAGNOSIS — I428 Other cardiomyopathies: Secondary | ICD-10-CM | POA: Diagnosis not present

## 2024-01-08 DIAGNOSIS — Z7901 Long term (current) use of anticoagulants: Secondary | ICD-10-CM | POA: Diagnosis not present

## 2024-01-09 ENCOUNTER — Ambulatory Visit: Payer: Self-pay | Admitting: Cardiology

## 2024-01-09 DIAGNOSIS — R7989 Other specified abnormal findings of blood chemistry: Secondary | ICD-10-CM

## 2024-01-09 LAB — BASIC METABOLIC PANEL WITH GFR
BUN/Creatinine Ratio: 13 (ref 12–28)
BUN: 26 mg/dL (ref 8–27)
CO2: 23 mmol/L (ref 20–29)
Calcium: 9.6 mg/dL (ref 8.7–10.3)
Chloride: 102 mmol/L (ref 96–106)
Creatinine, Ser: 2 mg/dL — ABNORMAL HIGH (ref 0.57–1.00)
Glucose: 112 mg/dL — ABNORMAL HIGH (ref 70–99)
Potassium: 3.8 mmol/L (ref 3.5–5.2)
Sodium: 142 mmol/L (ref 134–144)
eGFR: 26 mL/min/1.73 — ABNORMAL LOW (ref >59)

## 2024-01-09 LAB — PRO B NATRIURETIC PEPTIDE: NT-Pro BNP: 598 pg/mL — ABNORMAL HIGH (ref 0–301)

## 2024-01-11 ENCOUNTER — Other Ambulatory Visit: Payer: Self-pay

## 2024-01-11 DIAGNOSIS — R7989 Other specified abnormal findings of blood chemistry: Secondary | ICD-10-CM

## 2024-01-18 DIAGNOSIS — R7989 Other specified abnormal findings of blood chemistry: Secondary | ICD-10-CM | POA: Diagnosis not present

## 2024-01-19 ENCOUNTER — Ambulatory Visit: Payer: Self-pay

## 2024-01-19 LAB — BASIC METABOLIC PANEL WITH GFR
BUN/Creatinine Ratio: 9 — ABNORMAL LOW (ref 12–28)
BUN: 15 mg/dL (ref 8–27)
CO2: 23 mmol/L (ref 20–29)
Calcium: 8.9 mg/dL (ref 8.7–10.3)
Chloride: 106 mmol/L (ref 96–106)
Creatinine, Ser: 1.7 mg/dL — ABNORMAL HIGH (ref 0.57–1.00)
Glucose: 81 mg/dL (ref 70–99)
Potassium: 4.2 mmol/L (ref 3.5–5.2)
Sodium: 142 mmol/L (ref 134–144)
eGFR: 31 mL/min/1.73 — ABNORMAL LOW (ref >59)

## 2024-01-20 ENCOUNTER — Ambulatory Visit (INDEPENDENT_AMBULATORY_CARE_PROVIDER_SITE_OTHER): Payer: Medicare HMO

## 2024-01-20 DIAGNOSIS — I428 Other cardiomyopathies: Secondary | ICD-10-CM

## 2024-01-21 ENCOUNTER — Ambulatory Visit: Payer: Self-pay | Admitting: Cardiology

## 2024-01-21 LAB — CUP PACEART REMOTE DEVICE CHECK
Battery Remaining Longevity: 37 mo
Battery Voltage: 2.96 V
Brady Statistic AP VP Percent: 76.95 %
Brady Statistic AP VS Percent: 1.28 %
Brady Statistic AS VP Percent: 21.32 %
Brady Statistic AS VS Percent: 0.45 %
Brady Statistic RA Percent Paced: 77.89 %
Brady Statistic RV Percent Paced: 16.21 %
Date Time Interrogation Session: 20250806033325
HighPow Impedance: 65 Ohm
Implantable Lead Connection Status: 753985
Implantable Lead Connection Status: 753985
Implantable Lead Connection Status: 753985
Implantable Lead Implant Date: 20210311
Implantable Lead Implant Date: 20210311
Implantable Lead Implant Date: 20210311
Implantable Lead Location: 753858
Implantable Lead Location: 753859
Implantable Lead Location: 753860
Implantable Lead Model: 4598
Implantable Lead Model: 5076
Implantable Pulse Generator Implant Date: 20210311
Lead Channel Impedance Value: 209 Ohm
Lead Channel Impedance Value: 218.087
Lead Channel Impedance Value: 218.087
Lead Channel Impedance Value: 230.327
Lead Channel Impedance Value: 230.327
Lead Channel Impedance Value: 361 Ohm
Lead Channel Impedance Value: 418 Ohm
Lead Channel Impedance Value: 418 Ohm
Lead Channel Impedance Value: 456 Ohm
Lead Channel Impedance Value: 456 Ohm
Lead Channel Impedance Value: 513 Ohm
Lead Channel Impedance Value: 513 Ohm
Lead Channel Impedance Value: 665 Ohm
Lead Channel Impedance Value: 722 Ohm
Lead Channel Impedance Value: 760 Ohm
Lead Channel Impedance Value: 760 Ohm
Lead Channel Impedance Value: 779 Ohm
Lead Channel Impedance Value: 817 Ohm
Lead Channel Pacing Threshold Amplitude: 0.5 V
Lead Channel Pacing Threshold Amplitude: 0.625 V
Lead Channel Pacing Threshold Amplitude: 1.875 V
Lead Channel Pacing Threshold Pulse Width: 0.4 ms
Lead Channel Pacing Threshold Pulse Width: 0.4 ms
Lead Channel Pacing Threshold Pulse Width: 0.8 ms
Lead Channel Sensing Intrinsic Amplitude: 1.25 mV
Lead Channel Sensing Intrinsic Amplitude: 1.25 mV
Lead Channel Sensing Intrinsic Amplitude: 20 mV
Lead Channel Sensing Intrinsic Amplitude: 20 mV
Lead Channel Setting Pacing Amplitude: 1.5 V
Lead Channel Setting Pacing Amplitude: 2.5 V
Lead Channel Setting Pacing Amplitude: 2.5 V
Lead Channel Setting Pacing Pulse Width: 0.4 ms
Lead Channel Setting Pacing Pulse Width: 0.8 ms
Lead Channel Setting Sensing Sensitivity: 0.3 mV
Zone Setting Status: 755011
Zone Setting Status: 755011

## 2024-01-28 DIAGNOSIS — H1132 Conjunctival hemorrhage, left eye: Secondary | ICD-10-CM | POA: Diagnosis not present

## 2024-02-04 DIAGNOSIS — N184 Chronic kidney disease, stage 4 (severe): Secondary | ICD-10-CM | POA: Diagnosis not present

## 2024-02-04 DIAGNOSIS — I129 Hypertensive chronic kidney disease with stage 1 through stage 4 chronic kidney disease, or unspecified chronic kidney disease: Secondary | ICD-10-CM | POA: Diagnosis not present

## 2024-02-04 DIAGNOSIS — I509 Heart failure, unspecified: Secondary | ICD-10-CM | POA: Diagnosis not present

## 2024-02-05 LAB — RENAL FUNCTION PANEL: EGFR: 30

## 2024-02-19 ENCOUNTER — Ambulatory Visit

## 2024-02-19 VITALS — Ht 63.0 in | Wt 162.0 lb

## 2024-02-19 DIAGNOSIS — Z0000 Encounter for general adult medical examination without abnormal findings: Secondary | ICD-10-CM

## 2024-02-19 NOTE — Progress Notes (Signed)
 Subjective:   [name] is a 73 y.o. who presents for a Medicare Wellness preventive visit.  As a reminder, Annual Wellness Visits don't include a physical exam, and some assessments may be limited, especially if this visit is performed virtually. We may recommend an in-person follow-up visit with your provider if needed.  Visit Complete: Virtual I connected with  Twyla K Gusler on 02/19/24 by a audio enabled telemedicine application and verified that I am speaking with the correct person using two identifiers.  Patient Location: Home  Provider Location: Office/Clinic  I discussed the limitations of evaluation and management by telemedicine. The patient expressed understanding and agreed to proceed.  Vital Signs: Because this visit was a virtual/telehealth visit, some criteria may be missing or patient reported. Any vitals not documented were not able to be obtained and vitals that have been documented are patient reported.  VideoDeclined- This patient declined Librarian, academic. Therefore the visit was completed with audio only.  Persons Participating in Visit: Patient.  AWV Questionnaire: No: Patient Medicare AWV questionnaire was not completed prior to this visit.  Cardiac Risk Factors include: advanced age (>80men, >15 women);dyslipidemia;hypertension;sedentary lifestyle     Objective:    Today's Vitals   02/19/24 1453  Weight: 162 lb (73.5 kg)  Height: 5' 3 (1.6 m)   Body mass index is 28.7 kg/m.     02/19/2024    3:04 PM 12/16/2022    3:49 PM 07/31/2020    7:00 PM 07/30/2020    5:04 PM 06/12/2020    3:21 PM 04/14/2020    5:17 AM 01/04/2020    1:28 PM  Advanced Directives  Does Patient Have a Medical Advance Directive? Yes No No No Yes No No  Type of Estate agent of [place];Living will    Out of facility DNR (pink MOST or yellow form)    Does patient want to make changes to medical advance directive?   No -  Patient declined  No - Patient declined    Copy of Healthcare Power of Attorney in Chart? No - copy requested        Would patient like information on creating a medical advance directive?  No - Patient declined  No - Patient declined  No - Patient declined No - Patient declined    Current Medications (verified) Outpatient Encounter Medications as of 02/19/2024  Medication Sig   acetaminophen  (TYLENOL ) 500 MG tablet Take 1,000 mg by mouth 2 (two) times daily as needed (pain).   aspirin  81 MG chewable tablet Chew 1 tablet (81 mg total) by mouth daily.   carvedilol  (COREG ) 3.125 MG tablet TAKE 1 TABLET BY MOUTH TWICE A DAY WITH FOOD   Cholecalciferol  (VITAMIN D3 PO) Take 1,000 Units by mouth in the morning and at bedtime.   diphenhydrAMINE  (BENADRYL  CHILDRENS ALLERGY) 12.5 MG/5ML liquid Take 25 mg by mouth at bedtime.   ELIQUIS  5 MG TABS tablet TAKE 1 TABLET BY MOUTH TWICE A DAY   ezetimibe  (ZETIA ) 10 MG tablet TAKE 1 TABLET BY MOUTH EVERY DAY   famotidine  (PEPCID ) 20 MG tablet TAKE 1 TABLET BY MOUTH TWICE A DAY   methocarbamol  (ROBAXIN ) 500 MG tablet Take 1 tablet (500 mg total) by mouth 2 (two) times daily as needed for muscle spasms (sedation precautions).   rosuvastatin  (CRESTOR ) 10 MG tablet TAKE 1 TABLET (10 MG TOTAL) BY MOUTH DAILY AT 6 PM.   sertraline  (ZOLOFT ) 50 MG tablet TAKE 2 TABLETS BY MOUTH AT  BEDTIME.   sodium bicarbonate 325 MG tablet Take 325 mg by mouth 2 (two) times daily.   torsemide  (DEMADEX ) 20 MG tablet Take 1 tablet (20 mg total) by mouth daily.   vitamin B-12 (CYANOCOBALAMIN ) 1000 MCG tablet Take 1,000 mcg by mouth daily.   No facility-administered encounter medications on file as of 02/19/2024.    Allergies (verified) Shellfish-derived products, Ativan  [lorazepam ], Buspar  [buspirone ], Clarithromycin, Codeine, Doxycycline, Guaifenesin, Oxycodone, Prednisone, Statins, Sulfonamide derivatives, Tetracycline, Vicodin [hydrocodone -acetaminophen ], Famotidine , Latex,  Omeprazole , Peanut-containing drug products, Protonix  [pantoprazole ], and Wellbutrin [bupropion]   History: Past Medical History:  Diagnosis Date   Allergy    Anxiety    Arnold-Chiari malformation (HCC)    Arthritis    CHF (congestive heart failure) (HCC)    Chronic systolic heart failure (HCC) 11/03/2019   Coronary artery disease    Dyspnea    Encounter for assessment of implantable cardioverter-defibrillator (ICD) 11/10/2019   GERD (gastroesophageal reflux disease)    H. pylori infection    3 years ago   H/O cardiac arrest    Hx of VT/Vfib arrest   Hyperlipidemia    ICD BiV Medtronic model [id number] Claria Quad CRT-D SureScan ICD 08/25/2019    Incontinence    Paroxysmal atrial fibrillation (HCC) 08/06/2019   Pulmonary edema 2021   Syncope and collapse    Per pt, denies passing out   Tobacco use disorder    Unspecified essential hypertension    Past Surgical History:  Procedure Laterality Date   APPENDECTOMY     ARNOLD CHIARI SURGERY     neurocranial surgery   BIV ICD INSERTION CRT-D N/A 08/25/2019   Procedure: BIV ICD INSERTION CRT-D;  Surgeon: Inocencio Soyla Lunger, MD;  Location: [facility] INVASIVE CV LAB;  Service: Cardiovascular;  Laterality: N/A;   BLEPHAROPLASTY     Bil   C-EYE SURGERY PROCEDURE     CARDIAC CATHETERIZATION     CATARACT EXTRACTION Bilateral    CHOLECYSTECTOMY     EYE SURGERY     heart failure     LEFT HEART CATH AND CORONARY ANGIOGRAPHY N/A 08/04/2019   Procedure: LEFT HEART CATH AND CORONARY ANGIOGRAPHY;  Surgeon: Ladona Heinz, MD;  Location: [facility] INVASIVE CV LAB;  Service: Cardiovascular;  Laterality: N/A;   MASS EXCISION Right 12/27/2013   Procedure: MINOR EXCISION OF RIGHT THUMB MUCOID CYST, DEBRIDEMENT OF INTERPHALANGEAL JOINT;  Surgeon: Lamar Leonor Mickey LULLA, MD;  Location:  SURGERY CENTER;  Service: Orthopedics;  Laterality: Right;   mass on thumb  right   TOTAL KNEE ARTHROPLASTY Right 02/17/2017   TOTAL KNEE ARTHROPLASTY Right 02/17/2017    Procedure: TOTAL KNEE ARTHROPLASTY;  Surgeon: Sheril Coy, MD;  Location: [facility] OR;  Service: Orthopedics;  Laterality: Right;   TUBAL LIGATION     Family History  Problem Relation Age of Onset   Bone cancer Mother    Heart disease Mother    Cancer Sister        metastatic; unknown primary   Diverticulosis Sister    Tuberculosis Father    Hypertension Sister    Colon cancer Neg Hx    Esophageal cancer Neg Hx    Pancreatic cancer Neg Hx    Stomach cancer Neg Hx    Liver disease Neg Hx    Kidney disease Neg Hx    Rectal cancer Neg Hx    Pancreatic disease Neg Hx    Social History   Socioeconomic History   Marital status: Married    Spouse name: Not on file  Number of children: 2   Years of education: Not on file   Highest education level: Not on file  Occupational History   Occupation: hair dresser  Tobacco Use   Smoking status: Former    Current packs/day: 0.00    Average packs/day: 1 pack/day for 50.0 years (50.0 ttl pk-yrs)    Types: Cigarettes    Start date: 08/07/1969    Quit date: 08/08/2019    Years since quitting: 4.5   Smokeless tobacco: Never  Vaping Use   Vaping status: Never Used  Substance and Sexual Activity   Alcohol use: No    Alcohol/week: 0.0 standard drinks of alcohol   Drug use: No   Sexual activity: Not on file  Other Topics Concern   Not on file  Social History Narrative   Not on file   Social Drivers of Health   Financial Resource Strain: Low Risk  (02/19/2024)   Overall Financial Resource Strain (CARDIA)    Difficulty of Paying Living Expenses: Not hard at all  Food Insecurity: No Food Insecurity (02/19/2024)   Hunger Vital Sign    Worried About Running Out of Food in the Last Year: Never true    Ran Out of Food in the Last Year: Never true  Transportation Needs: No Transportation Needs (02/19/2024)   PRAPARE - Administrator, Civil Service (Medical): No    Lack of Transportation (Non-Medical): No  Physical Activity: Inactive  (02/19/2024)   Exercise Vital Sign    Days of Exercise per Week: 0 days    Minutes of Exercise per Session: 0 min  Stress: No Stress Concern Present (02/19/2024)   [employer] of Occupational Health - Occupational Stress Questionnaire    Feeling of Stress: Not at all  Social Connections: Moderately Isolated (02/19/2024)   Social Connection and Isolation Panel    Frequency of Communication with Friends and Family: More than three times a week    Frequency of Social Gatherings with Friends and Family: More than three times a week    Attends Religious Services: Never    Database administrator or Organizations: No    Attends Engineer, structural: Never    Marital Status: Married    Tobacco Counseling Counseling given: Not Answered    Clinical Intake:  Pre-visit preparation completed: Yes  Pain : No/denies pain     BMI - recorded: 28.7 Nutritional Status: BMI 25 -29 Overweight Nutritional Risks: None Diabetes: No  Lab Results  Component Value Date   HGBA1C 5.7 (H) 08/03/2019     How often do you need to have someone help you when you read instructions, pamphlets, or other written materials from your doctor or pharmacy?: 1 - Never  Interpreter Needed?: No  Comments: lives with husband Information entered by :: B.Harvis Mabus,LPN   Activities of Daily Living     02/19/2024    3:04 PM  In your present state of health, do you have any difficulty performing the following activities:  Hearing? 0  Vision? 0  Difficulty concentrating or making decisions? 0  Walking or climbing stairs? 0  Dressing or bathing? 0  Doing errands, shopping? 0  Preparing Food and eating ? N  Using the Toilet? N  In the past six months, have you accidently leaked urine? Y  Do you have problems with loss of bowel control? N  Managing your Medications? N  Managing your Finances? N  Housekeeping or managing your Housekeeping? N    Patient Care Team:  Avelina Greig BRAVO, MD as PCP - General  (Family Medicine) Michele Richardson, DO as PCP - Cardiology (Cardiology) Inocencio Soyla Lunger, MD as PCP - Electrophysiology (Cardiology) Rilla Baller, MD (Family Medicine) Michele Richardson, DO as Consulting Physician (Cardiology) Fate Morna SAILOR, [facility] (Inactive) as Pharmacist (Pharmacist) [facility]  I have updated your Care Teams any recent Medical Services you may have received from other providers in the past year.     Assessment:   This is a routine wellness examination for Raeana.  Hearing/Vision screen Hearing Screening - Comments:: Patient denies any hearing difficulties Vision Screening - Comments:: Pt says their vision is good without glasses;readers only Dr  Octavia   Goals Addressed               This Visit's Progress     Stay Healthy (pt-stated)   On track     02/19/24       Depression Screen     02/19/2024    3:02 PM 11/13/2023   12:06 PM 12/16/2022    3:45 PM 11/21/2022    1:59 PM 09/10/2022   12:10 PM 10/04/2021   12:00 PM 07/27/2020    1:06 PM  PHQ 2/9 Scores  PHQ - 2 Score 0 0 0 0 0 0 2  PHQ- 9 Score   0 2 6 0 6    Fall Risk     02/19/2024    2:57 PM 11/13/2023   12:06 PM 12/16/2022    3:48 PM 09/10/2022   12:10 PM 10/04/2021   12:00 PM  Fall Risk   Falls in the past year? 0 1 0 0 0  Number falls in past yr: 0 0 0 0 0  Injury with Fall? 0 0 0 0 0  Risk for fall due to : No Fall Risks  No Fall Risks No Fall Risks No Fall Risks  Follow up Education provided;Falls prevention discussed  Falls prevention discussed Falls evaluation completed Falls evaluation completed      Data saved with a previous flowsheet row definition    MEDICARE RISK AT HOME:  Medicare Risk at Home Any stairs in or around the home?: Yes If so, are there any without handrails?: Yes Home free of loose throw rugs in walkways, pet beds, electrical cords, etc?: Yes Adequate lighting in your home to reduce risk of falls?: Yes Life alert?: No Use of a cane, walker or  w/c?: No Grab bars in the bathroom?: Yes Shower chair or bench in shower?: Yes Elevated toilet seat or a handicapped toilet?: Yes  TIMED UP AND GO:  Was the test performed?  No  Cognitive Function: 6CIT completed        02/19/2024    3:05 PM 12/16/2022    3:49 PM  6CIT Screen  What Year? 0 points 0 points  What month? 0 points 0 points  What time? 0 points 0 points  Count back from 20 0 points 0 points  Months in reverse 0 points 0 points  Repeat phrase 0 points 0 points  Total Score 0 points 0 points    Immunizations Immunization History  Administered Date(s) Administered   Influenza Split 07/01/2011   PFIZER(Purple Top)SARS-COV-2 Vaccination 02/16/2020, 03/08/2020, 10/05/2020   PNEUMOCOCCAL CONJUGATE-20 10/04/2021   Zoster, Live 12/22/2011    Screening Tests Health Maintenance  Topic Date Due   DTaP/Tdap/Td (1 - Tdap) Never done   Lung Cancer Screening  Never done   Zoster Vaccines- Shingrix (1 of 2) 07/13/2000  MAMMOGRAM  07/26/2020   Influenza Vaccine  01/15/2024   COVID-19 Vaccine (4 - 2025-26 season) 02/15/2024   DEXA SCAN  09/09/2024 (Originally 07/14/2015)   Colonoscopy  09/09/2024 (Originally 07/08/2018)   Medicare Annual Wellness (AWV)  02/18/2025   Pneumococcal Vaccine: 50+ Years  Completed   Hepatitis C Screening  Completed   HPV VACCINES  Aged Out   Meningococcal B Vaccine  Aged Out    Health Maintenance Items Addressed: None due at this time. Pt will receive vaccines at their pharmcy when decided to obtain   Additional Screening:  Vision Screening: Recommended annual ophthalmology exams for early detection of glaucoma and other disorders of the eye. Is the patient up to date with their annual eye exam?  No  Who is the provider or what is the name of the office in which the patient attends annual eye exams? Dr Charlie GORMAN Gaudy  Dental Screening: Recommended annual dental exams for proper oral hygiene  Community Resource Referral / Chronic Care  Management: CRR required this visit?  No   CCM required this visit?  Appt scheduled with PCP   Plan:    I have personally reviewed and noted the following in the patient's chart:   Medical and social history Use of alcohol, tobacco or illicit drugs  Current medications and supplements including opioid prescriptions. Patient is not currently taking opioid prescriptions. Functional ability and status Nutritional status Physical activity Advanced directives List of other physicians Hospitalizations, surgeries, and ER visits in previous 12 months Vitals Screenings to include cognitive, depression, and falls Referrals and appointments  In addition, I have reviewed and discussed with patient certain preventive protocols, quality metrics, and best practice recommendations. A written personalized care plan for preventive services as well as general preventive health recommendations were provided to patient.   Erminio LITTIE Saris, LPN   [id number]   After Visit Summary: (MyChart) Due to this being a telephonic visit, the after visit summary with patients personalized plan was offered to patient via MyChart   Notes: Nothing significant to report at this time.

## 2024-02-19 NOTE — Patient Instructions (Signed)
 [name],  Thank you for taking the time for your Medicare Wellness Visit. I appreciate your continued commitment to your health goals. Please review the care plan we discussed, and feel free to reach out if I can assist you further.  Medicare recommends these wellness visits once per year to help you and your care team stay ahead of potential health issues. These visits are designed to focus on prevention, allowing your provider to concentrate on managing your acute and chronic conditions during your regular appointments.  Please note that Annual Wellness Visits do not include a physical exam. Some assessments may be limited, especially if the visit was conducted virtually. If needed, we may recommend a separate in-person follow-up with your provider.  Ongoing Care Seeing your primary care provider every 3 to 6 months helps us  monitor your health and provide consistent, personalized care.   Referrals If a referral was made during today's visit and you haven't received any updates within two weeks, please contact the referred provider directly to check on the status.  Recommended Screenings:  Health Maintenance  Topic Date Due   DTaP/Tdap/Td vaccine (1 - Tdap) Never done   Screening for Lung Cancer  Never done   Zoster (Shingles) Vaccine (1 of 2) 07/13/2000   Mammogram  07/26/2020   Flu Shot  01/15/2024   COVID-19 Vaccine (4 - 2025-26 season) 02/15/2024   DEXA scan (bone density measurement)  09/09/2024*   Colon Cancer Screening  09/09/2024*   Medicare Annual Wellness Visit  02/18/2025   Pneumococcal Vaccine for age over 61  Completed   Hepatitis C Screening  Completed   HPV Vaccine  Aged Out   Meningitis B Vaccine  Aged Out  *Topic was postponed. The date shown is not the original due date.       12/16/2022    3:49 PM  Advanced Directives  Does Patient Have a Medical Advance Directive? No  Would patient like information on creating a medical advance directive? No - Patient  declined   Advance Care Planning is important because it: Ensures you receive medical care that aligns with your values, goals, and preferences. Provides guidance to your family and loved ones, reducing the emotional burden of decision-making during critical moments.  Vision: Annual vision screenings are recommended for early detection of glaucoma, cataracts, and diabetic retinopathy. These exams can also reveal signs of chronic conditions such as diabetes and high blood pressure.  Dental: Annual dental screenings help detect early signs of oral cancer, gum disease, and other conditions linked to overall health, including heart disease and diabetes.

## 2024-02-23 ENCOUNTER — Encounter: Payer: Self-pay | Admitting: Cardiology

## 2024-03-11 ENCOUNTER — Other Ambulatory Visit: Payer: Self-pay | Admitting: Cardiology

## 2024-03-11 ENCOUNTER — Encounter: Payer: Self-pay | Admitting: Family Medicine

## 2024-03-11 ENCOUNTER — Ambulatory Visit: Admitting: Family Medicine

## 2024-03-11 VITALS — BP 154/85 | HR 67 | Temp 98.5°F | Ht 63.5 in | Wt 166.2 lb

## 2024-03-11 DIAGNOSIS — I480 Paroxysmal atrial fibrillation: Secondary | ICD-10-CM

## 2024-03-11 DIAGNOSIS — Z7901 Long term (current) use of anticoagulants: Secondary | ICD-10-CM

## 2024-03-11 DIAGNOSIS — I130 Hypertensive heart and chronic kidney disease with heart failure and stage 1 through stage 4 chronic kidney disease, or unspecified chronic kidney disease: Secondary | ICD-10-CM | POA: Diagnosis not present

## 2024-03-11 DIAGNOSIS — I10 Essential (primary) hypertension: Secondary | ICD-10-CM

## 2024-03-11 DIAGNOSIS — I5020 Unspecified systolic (congestive) heart failure: Secondary | ICD-10-CM

## 2024-03-11 DIAGNOSIS — N184 Chronic kidney disease, stage 4 (severe): Secondary | ICD-10-CM

## 2024-03-11 NOTE — Telephone Encounter (Signed)
 Eliquis  5mg  refill request received. Patient is 73 years old, weight-73.5kg, Crea-1.70 on 01/18/24, Diagnosis-Afib, and last seen by Dr. Michele on 12/31/23. Dose is appropriate based on dosing criteria. Will send in refill to requested pharmacy.

## 2024-03-11 NOTE — Assessment & Plan Note (Signed)
 Above goal in office today, chronic.  Continue current medication, follow BP at home.. call if persistently > 140/90.  Well-controlled on Coreg  3.125 mg p.o. twice daily, Lasix  20 mg daily

## 2024-03-11 NOTE — Progress Notes (Signed)
 Patient ID: [name], female    DOB: 1950-11-04, 73 y.o.   MRN: [id number]  This visit was conducted in person.  BP (!) 158/78   Pulse (!) 53   Temp 98.5 F (36.9 C) (Temporal)   Ht 5' 3.5 (1.613 m)   Wt 166 lb 4 oz (75.4 kg)   SpO2 95%   BMI 28.99 kg/m    CC:  Chief Complaint  Patient presents with   Medical Management of Chronic Issues    Follow up Chronic Issues    Subjective:   HPI: [name] is a 73 y.o. female presenting on 03/11/2024 for Medical Management of Chronic Issues (Follow up Chronic Issues)  Flowsheet Row Office Visit from 03/11/2024 in [facility]  PHQ-2 Total Score 0   Hypertension: Usually well-controlled, elevated in office today.  On Coreg  3.125 mg p.o. twice daily, Lasix  20 mg daily. BP Readings from Last 3 Encounters:  03/11/24 (!) 158/78  12/31/23 (!) 155/79  11/13/23 126/84  Using medication without problems or lightheadedness:  none Chest pain with exertion: none Edema:none Short of breath: none Average home BPs: 130/70s Other issues:   HFrEF, atrial fibrillation followed by cardiology: on eliquis  5 mg BID  for anticoagulation and rate controlled with coreg  3.125 mg BID  next appt with Dr. Michele 06/2024  CKD followed by Dr.  Dardon  Cr 1.78.   MDD, mild   inadequate control on sertraline  50 mg daily.  Some trouble sleeping at night... worrying      Relevant past medical, surgical, family and social history reviewed and updated as indicated. Interim medical history since our last visit reviewed. Allergies and medications reviewed and updated. Outpatient Medications Prior to Visit  Medication Sig Dispense Refill   acetaminophen  (TYLENOL ) 500 MG tablet Take 1,000 mg by mouth 2 (two) times daily as needed (pain).     aspirin  81 MG chewable tablet Chew 1 tablet (81 mg total) by mouth daily. 30 tablet 0   carvedilol  (COREG ) 3.125 MG tablet TAKE 1 TABLET BY MOUTH TWICE A DAY WITH FOOD 180 tablet  2   Cholecalciferol  (VITAMIN D3 PO) Take 1,000 Units by mouth in the morning and at bedtime.     diphenhydrAMINE  (BENADRYL  CHILDRENS ALLERGY) 12.5 MG/5ML liquid Take 25 mg by mouth at bedtime.     ELIQUIS  5 MG TABS tablet TAKE 1 TABLET BY MOUTH TWICE A DAY 180 tablet 1   ezetimibe  (ZETIA ) 10 MG tablet TAKE 1 TABLET BY MOUTH EVERY DAY 90 tablet 3   famotidine  (PEPCID ) 20 MG tablet TAKE 1 TABLET BY MOUTH TWICE A DAY 180 tablet 1   methocarbamol  (ROBAXIN ) 500 MG tablet Take 1 tablet (500 mg total) by mouth 2 (two) times daily as needed for muscle spasms (sedation precautions). 30 tablet 0   rosuvastatin  (CRESTOR ) 10 MG tablet Take 1 tablet (10 mg total) by mouth daily at 6 PM. 90 tablet 1   sertraline  (ZOLOFT ) 50 MG tablet TAKE 2 TABLETS BY MOUTH AT BEDTIME. 180 tablet 1   sodium bicarbonate 325 MG tablet Take 325 mg by mouth 2 (two) times daily.     torsemide  (DEMADEX ) 20 MG tablet Take 1 tablet (20 mg total) by mouth daily. 90 tablet 3   vitamin B-12 (CYANOCOBALAMIN ) 1000 MCG tablet Take 1,000 mcg by mouth daily.     No facility-administered medications prior to visit.     Per HPI unless specifically indicated in ROS section below  Review of Systems  Constitutional:  Negative for fatigue and fever.  HENT:  Negative for congestion.   Eyes:  Negative for pain.  Respiratory:  Negative for cough and shortness of breath.   Cardiovascular:  Negative for chest pain, palpitations and leg swelling.  Gastrointestinal:  Negative for abdominal pain.  Genitourinary:  Negative for dysuria and vaginal bleeding.  Musculoskeletal:  Negative for back pain.  Neurological:  Negative for syncope, light-headedness and headaches.  Psychiatric/Behavioral:  Negative for dysphoric mood.    Objective:  BP (!) 158/78   Pulse (!) 53   Temp 98.5 F (36.9 C) (Temporal)   Ht 5' 3.5 (1.613 m)   Wt 166 lb 4 oz (75.4 kg)   SpO2 95%   BMI 28.99 kg/m   Wt Readings from Last 3 Encounters:  03/11/24 166 lb 4 oz  (75.4 kg)  02/19/24 162 lb (73.5 kg)  12/31/23 164 lb 6.4 oz (74.6 kg)      Physical Exam Vitals and nursing note reviewed.  Constitutional:      General: She is not in acute distress.    Appearance: Normal appearance. She is well-developed. She is not ill-appearing or toxic-appearing.  HENT:     Head: Normocephalic.     Right Ear: Hearing, tympanic membrane, ear canal and external ear normal.     Left Ear: Hearing, tympanic membrane, ear canal and external ear normal.     Nose: Nose normal.  Eyes:     General: Lids are normal. Lids are everted, no foreign bodies appreciated.     Conjunctiva/sclera: Conjunctivae normal.     Pupils: Pupils are equal, round, and reactive to light.  Neck:     Thyroid : No thyroid  mass or thyromegaly.     Vascular: No carotid bruit.     Trachea: Trachea normal.  Cardiovascular:     Rate and Rhythm: Normal rate and regular rhythm.     Heart sounds: Normal heart sounds, S1 normal and S2 normal. No murmur heard.    No gallop.  Pulmonary:     Effort: Pulmonary effort is normal. No respiratory distress.     Breath sounds: Normal breath sounds. No wheezing, rhonchi or rales.  Abdominal:     General: Bowel sounds are normal. There is no distension or abdominal bruit.     Palpations: Abdomen is soft. There is no fluid wave or mass.     Tenderness: There is no abdominal tenderness. There is no guarding or rebound.     Hernia: No hernia is present.  Musculoskeletal:     Cervical back: Normal range of motion and neck supple.  Lymphadenopathy:     Cervical: No cervical adenopathy.  Skin:    General: Skin is warm and dry.     Findings: No rash.  Neurological:     Mental Status: She is alert.     Cranial Nerves: No cranial nerve deficit.     Sensory: No sensory deficit.  Psychiatric:        Mood and Affect: Mood is not anxious or depressed.        Speech: Speech normal.        Behavior: Behavior normal. Behavior is cooperative.        Judgment:  Judgment normal.       Results for orders placed or performed in visit on 02/05/24  Renal function panel   Collection Time: 02/04/24 12:00 AM  Result Value Ref Range   EGFR 30.0     This visit  occurred during the SARS-CoV-2 public health emergency.  Safety protocols were in place, including screening questions prior to the visit, additional usage of staff PPE, and extensive cleaning of exam room while observing appropriate contact time as indicated for disinfecting solutions.   COVID 19 screen:  No recent travel or known exposure to COVID19 The patient denies respiratory symptoms of COVID 19 at this time. The importance of social distancing was discussed today.   Assessment and Plan Essential hypertension Assessment & Plan: Above goal in office today, chronic.  Continue current medication, follow BP at home.. call if persistently > 140/90.  Well-controlled on Coreg  3.125 mg p.o. twice daily, Lasix  20 mg daily   CKD (chronic kidney disease) stage 4, GFR 15-29 ml/min (HCC) Assessment & Plan:  Followed by nephrology.   HFrEF (heart failure with reduced ejection fraction) (HCC) Assessment & Plan: Chronic, well controlled followed by cardiology.  Euvolemic in office today          Greig Ring, MD

## 2024-03-11 NOTE — Assessment & Plan Note (Signed)
 Followed by nephrology.

## 2024-03-11 NOTE — Assessment & Plan Note (Signed)
Chronic, well controlled followed by cardiology.  Euvolemic in office today

## 2024-03-14 NOTE — Progress Notes (Signed)
 Remote ICD Transmission

## 2024-03-14 NOTE — Progress Notes (Signed)
 [facility] - Clinic Note  03/21/2024     CHIEF COMPLAINT Patient presents for Retina Follow Up   HISTORY OF PRESENT ILLNESS: [name] is a 73 y.o. female who presents to the clinic today for:   HPI     Retina Follow Up   In both eyes.  This started 6 months ago.  Duration of 6 months.  Since onset it is stable.        Comments   6 month retina follow up Schisis OU pt is reporting no vision changes noticed she denies any flashes has some floater at times       Last edited by Resa Delon ORN, COT on 03/21/2024 12:48 PM.     Patient feels the vision is a little tougher reading.   Referring physician: Avelina Greig BRAVO, MD [address] [place],  [place] [address]  HISTORICAL INFORMATION:   Selected notes from the MEDICAL RECORD NUMBER Referred by Dr. Izetta Forts for retinoschisis OU LEE: 08.29.19 (K. Hallahan) [BCVA: OD: 20/20 OS: 20/20-] Ocular Hx-cataracts OU, glaucoma suspect OU PMH-anxiety, HTN, current smoker    CURRENT MEDICATIONS: No current outpatient medications on file. (Ophthalmic Drugs)   No current facility-administered medications for this visit. (Ophthalmic Drugs)   Current Outpatient Medications (Other)  Medication Sig   acetaminophen  (TYLENOL ) 500 MG tablet Take 1,000 mg by mouth 2 (two) times daily as needed (pain).   aspirin  81 MG chewable tablet Chew 1 tablet (81 mg total) by mouth daily.   carvedilol  (COREG ) 3.125 MG tablet TAKE 1 TABLET BY MOUTH TWICE A DAY WITH FOOD   Cholecalciferol  (VITAMIN D3 PO) Take 1,000 Units by mouth in the morning and at bedtime.   diphenhydrAMINE  (BENADRYL  CHILDRENS ALLERGY) 12.5 MG/5ML liquid Take 25 mg by mouth at bedtime.   ELIQUIS  5 MG TABS tablet TAKE 1 TABLET BY MOUTH TWICE A DAY   ezetimibe  (ZETIA ) 10 MG tablet TAKE 1 TABLET BY MOUTH EVERY DAY   famotidine  (PEPCID ) 20 MG tablet TAKE 1 TABLET BY MOUTH TWICE A DAY   methocarbamol  (ROBAXIN ) 500 MG tablet Take 1 tablet  (500 mg total) by mouth 2 (two) times daily as needed for muscle spasms (sedation precautions).   rosuvastatin  (CRESTOR ) 10 MG tablet Take 1 tablet (10 mg total) by mouth daily at 6 PM.   sertraline  (ZOLOFT ) 50 MG tablet TAKE 2 TABLETS BY MOUTH AT BEDTIME.   sodium bicarbonate 325 MG tablet Take 325 mg by mouth 2 (two) times daily.   torsemide  (DEMADEX ) 20 MG tablet Take 1 tablet (20 mg total) by mouth daily.   vitamin B-12 (CYANOCOBALAMIN ) 1000 MCG tablet Take 1,000 mcg by mouth daily.   No current facility-administered medications for this visit. (Other)   REVIEW OF SYSTEMS: ROS   Positive for: Gastrointestinal, Neurological, Musculoskeletal, Cardiovascular, Eyes Negative for: Constitutional, Skin, Genitourinary, HENT, Endocrine, Respiratory, Psychiatric, Allergic/Imm, Heme/Lymph Last edited by Resa Delon ORN, COT on 03/21/2024 12:43 PM.      ALLERGIES Allergies  Allergen Reactions   Shellfish-Derived Products Anaphylaxis   Ativan  [Lorazepam ] Other (See Comments)    Makes skin crawl and insomnia   Buspar  [Buspirone ] Nausea Only    Sweating and dizzy   Clarithromycin Swelling   Codeine Nausea And Vomiting   Doxycycline Other (See Comments)    Unknown   Guaifenesin Other (See Comments)    Palpitations   Oxycodone Nausea And Vomiting   Prednisone Nausea And Vomiting and Other (See Comments)  Makes my heart race   Statins Other (See Comments)    Muscle pain   Sulfonamide Derivatives Nausea And Vomiting    Achiness   Tetracycline Nausea Only   Vicodin [Hydrocodone -Acetaminophen ] Nausea And Vomiting   Famotidine  Rash   Latex Rash   Omeprazole  Nausea Only    Cough, shortness of breath - patient doesn't remember   Peanut-Containing Drug Products Itching and Rash   Protonix  [Pantoprazole ] Rash   Wellbutrin [Bupropion] Palpitations   PAST MEDICAL HISTORY Past Medical History:  Diagnosis Date   Allergy    Anxiety    Arnold-Chiari malformation (HCC)    Arthritis     CHF (congestive heart failure) (HCC)    Chronic systolic heart failure (HCC) 11/03/2019   Coronary artery disease    Dyspnea    Encounter for assessment of implantable cardioverter-defibrillator (ICD) 11/10/2019   GERD (gastroesophageal reflux disease)    H. pylori infection    3 years ago   H/O cardiac arrest    Hx of VT/Vfib arrest   Hyperlipidemia    ICD BiV Medtronic model [id number] Claria Quad CRT-D SureScan ICD 08/25/2019    Incontinence    Paroxysmal atrial fibrillation (HCC) 08/06/2019   Pulmonary edema 2021   Syncope and collapse    Per pt, denies passing out   Tobacco use disorder    Unspecified essential hypertension    Past Surgical History:  Procedure Laterality Date   APPENDECTOMY     ARNOLD CHIARI SURGERY     neurocranial surgery   BIV ICD INSERTION CRT-D N/A 08/25/2019   Procedure: BIV ICD INSERTION CRT-D;  Surgeon: Inocencio Soyla Lunger, MD;  Location: [facility] INVASIVE CV LAB;  Service: Cardiovascular;  Laterality: N/A;   BLEPHAROPLASTY     Bil   C-EYE SURGERY PROCEDURE     CARDIAC CATHETERIZATION     CATARACT EXTRACTION Bilateral    CHOLECYSTECTOMY     EYE SURGERY     heart failure     LEFT HEART CATH AND CORONARY ANGIOGRAPHY N/A 08/04/2019   Procedure: LEFT HEART CATH AND CORONARY ANGIOGRAPHY;  Surgeon: Ladona Heinz, MD;  Location: [facility] INVASIVE CV LAB;  Service: Cardiovascular;  Laterality: N/A;   MASS EXCISION Right 12/27/2013   Procedure: MINOR EXCISION OF RIGHT THUMB MUCOID CYST, DEBRIDEMENT OF INTERPHALANGEAL JOINT;  Surgeon: Lamar Leonor Mickey LULLA, MD;  Location: [facility];  Service: Orthopedics;  Laterality: Right;   mass on thumb  right   TOTAL KNEE ARTHROPLASTY Right 02/17/2017   TOTAL KNEE ARTHROPLASTY Right 02/17/2017   Procedure: TOTAL KNEE ARTHROPLASTY;  Surgeon: Sheril Coy, MD;  Location: [facility] OR;  Service: Orthopedics;  Laterality: Right;   TUBAL LIGATION     FAMILY HISTORY Family History  Problem Relation Age of Onset   Bone  cancer Mother    Heart disease Mother    Cancer Sister        metastatic; unknown primary   Diverticulosis Sister    Tuberculosis Father    Hypertension Sister    Colon cancer Neg Hx    Esophageal cancer Neg Hx    Pancreatic cancer Neg Hx    Stomach cancer Neg Hx    Liver disease Neg Hx    Kidney disease Neg Hx    Rectal cancer Neg Hx    Pancreatic disease Neg Hx    SOCIAL HISTORY Social History   Tobacco Use   Smoking status: Former    Current packs/day: 0.00    Average packs/day: 1 pack/day for 50.0  years (50.0 ttl pk-yrs)    Types: Cigarettes    Start date: 08/07/1969    Quit date: 08/08/2019    Years since quitting: 4.6   Smokeless tobacco: Never  Vaping Use   Vaping status: Never Used  Substance Use Topics   Alcohol use: No    Alcohol/week: 0.0 standard drinks of alcohol   Drug use: No       OPHTHALMIC EXAM:  Base Eye Exam     Visual Acuity (Snellen - Linear)       Right Left   Dist [place] 20/30 -1 20/20   Dist ph [place] 20/25 -2          Tonometry (Tonopen, 12:52 PM)       Right Left   Pressure 18 18  Squeezing         Pupils       Pupils Dark Light Shape React APD   Right PERRL 4 3 Round Brisk None   Left PERRL 4 3 Round Brisk None         Visual Fields       Left Right    Full Full         Extraocular Movement       Right Left    Full, Ortho Full, Ortho         Neuro/Psych     Oriented x3: Yes   Mood/Affect: Normal         Dilation     Both eyes: 2.5% Phenylephrine  @ 12:52 PM           Slit Lamp and Fundus Exam     Slit Lamp Exam       Right Left   Lids/Lashes Dermatochalasis - upper lid Dermatochalasis - upper lid, Meibomian gland dysfunction   Conjunctiva/Sclera Nasal and Temporal Pinguecula Nasal and Temporal Pinguecula   Cornea Arcus, 1+ Punctate epithelial erosions, Debris in tear film, Well healed temporal cataract wound Arcus, 2+ Punctate epithelial erosions   Anterior Chamber deep, clear, no cell/flare  deep, clear, no cell/flare   Iris Round and dilated Round and dilated   Lens PC IOL in good postition PC IOL in good postition   Anterior Vitreous Vitreous syneresis Vitreous syneresis, Posterior vitreous detachment         Fundus Exam       Right Left   Disc Pink and Sharp Pink and Sharp, mild central Pallor, +cupping   C/D Ratio 0.6 0.7   Macula blunted foveal reflex, scattered MA, no edema, +druplets, RPE mottling and clumping, trace central cystic changes- improved Flat, good foveal reflex, Retinal pigment epithelial mottling and clumping, +drusen, No heme or edema   Vessels attenuated, Tortuous attenuated, Tortuous   Periphery Attached, bullous retinoschisis cavity inferiorly from 0600 to 0730 - good laser surrounding, inferior reticular degeneration, No RT/RD on 360 exam Attached, bullous retinoschisis cavity from 0430 to 0600 -- good laser surrounding, reticular degeneration, RPE mottling, No new RT/RD            IMAGING AND PROCEDURES  Imaging and Procedures for @TODAY @  OCT, Retina - OU - Both Eyes       Right Eye Quality was good. Central Foveal Thickness: 299. Progression has been stable. Findings include normal foveal contour, no IRF, no SRF, retinal drusen (Focal cystic changes/IRF IN fovea-- stably improved, Inferotemporal retinoschisis with surrounding ORA (laser) caught on widefield OCT - stable).   Left Eye Quality was good. Central Foveal Thickness: 283. Progression has been stable.  Findings include normal foveal contour, no IRF, no SRF, retinal drusen (Inferotemporal retinoschisis caught on widefield OCT ).   Notes *Images captured and stored on drive  Diagnosis / Impression:  NFP; no IRF OU OD: Focal cystic changes/IRF IN fovea--improved, Inferotemporal retinoschisis with surrounding ORA (laser) caught on widefield OCT - stable Retinal drusen OU   Clinical management:  See below  Abbreviations: NFP - Normal foveal profile. CME - cystoid macular  edema. PED - pigment epithelial detachment. IRF - intraretinal fluid. SRF - subretinal fluid. EZ - ellipsoid zone. ERM - epiretinal membrane. ORA - outer retinal atrophy. ORT - outer retinal tubulation. 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 Essential hypertension  I10     5. Hypertensive retinopathy of both eyes  H35.033     6. Pseudophakia  Z96.1     7. Dry eyes, bilateral  H04.123      1. Retinoschisis OU  - bullous, inferotemporal retinoschisis cavities OU (OD > OS)   - schisis confirmed by widefield OCT -- stable  - no retinal tears or holes on depressed exam - fairly large cavity with significant posterior extension -- notable in comparison to 02/2018 baseline images  - s/p laser retinopexy OD (04.08.24) -- good laser surrounding - s/p laser retinopexy OS (04.22.24) -- good laser surrounding - stable  - F/U 6-9 months DFE, OCT, sooner prn  2. Retinal drusen OU  - mild OCT findings -- stable from prior  - monitor  3. Hx of refractile arteriolar plaque superiorly OD - located at 1 o'clock midzone at arteriolar bifurcation -- not present today ?resolved  - good distal perfusion   - monitor  4,5. Hypertensive retinopathy OU  - discussed importance of tight BP control  - monitor  6. Pseudophakia OU  - s/p CE/IOL OU in August 2024 with Dr. CANDIE Gaudy  - IOLs in good position, doing well  - BCVA 20/25 OD, 20/20 OS  - OCT OD with mild cystic changes -- ?early CME  - monitor -- f/u in 3 months  7. Dry eyes OU - recommend artificial tears and lubricating ointment as needed   Ophthalmic Meds Ordered this visit:  No orders of the defined types were placed in this encounter.    Return in about 6 months (around 09/19/2024) for f/u schisis, DFE,  OCT.  There are no Patient Instructions on file for this visit.   Explained the diagnoses, plan, and follow up with the patient and they expressed understanding.  Patient expressed understanding of the importance of proper follow up care.   This document serves as a record of services personally performed by Redell JUDITHANN Hans, MD, PhD. It was created on their behalf by Avelina Pereyra, COA an ophthalmic technician. The creation of this record is the provider's dictation and/or activities during the visit.   Electronically signed by: Avelina GORMAN Pereyra, COT  03/21/24  1:15 PM  This document serves as a record of services personally performed by Redell JUDITHANN Hans, MD, PhD. It was created on their behalf by Wanda GEANNIE Keens, COT an ophthalmic technician. The creation of this record is the provider's dictation and/or  activities during the visit.    Electronically signed by:  Wanda GEANNIE Keens, COT  03/21/24 1:15 PM  Redell JUDITHANN Hans, M.D., Ph.D. Diseases & Surgery of the Retina and Vitreous [facility]    Abbreviations: M myopia (nearsighted); A astigmatism; H hyperopia (farsighted); P presbyopia; Mrx spectacle prescription;  CTL contact lenses; OD right eye; OS left eye; OU both eyes  XT exotropia; ET esotropia; PEK punctate epithelial keratitis; PEE punctate epithelial erosions; DES dry eye syndrome; MGD meibomian gland dysfunction; ATs artificial tears; PFAT's preservative free artificial tears; NSC nuclear sclerotic cataract; PSC posterior subcapsular cataract; ERM epi-retinal membrane; PVD posterior vitreous detachment; RD retinal detachment; DM diabetes mellitus; DR diabetic retinopathy; NPDR non-proliferative diabetic retinopathy; PDR proliferative diabetic retinopathy; CSME clinically significant macular edema; DME diabetic macular edema; dbh dot blot hemorrhages; CWS cotton wool spot; POAG primary open angle glaucoma; C/D cup-to-disc ratio; HVF humphrey visual field; GVF  goldmann visual field; OCT optical coherence tomography; IOP intraocular pressure; BRVO Branch retinal vein occlusion; CRVO central retinal vein occlusion; CRAO central retinal artery occlusion; BRAO branch retinal artery occlusion; RT retinal tear; SB scleral buckle; PPV pars plana vitrectomy; VH Vitreous hemorrhage; PRP panretinal laser photocoagulation; IVK intravitreal kenalog; VMT vitreomacular traction; MH Macular hole;  NVD neovascularization of the disc; NVE neovascularization elsewhere; AREDS age related eye disease study; ARMD age related macular degeneration; POAG primary open angle glaucoma; EBMD epithelial/anterior basement membrane dystrophy; ACIOL anterior chamber intraocular lens; IOL intraocular lens; PCIOL posterior chamber intraocular lens; Phaco/IOL phacoemulsification with intraocular lens placement; PRK photorefractive keratectomy; LASIK laser assisted in situ keratomileusis; HTN hypertension; DM diabetes mellitus; COPD chronic obstructive pulmonary disease

## 2024-03-21 ENCOUNTER — Ambulatory Visit (INDEPENDENT_AMBULATORY_CARE_PROVIDER_SITE_OTHER): Admitting: Ophthalmology

## 2024-03-21 ENCOUNTER — Encounter (INDEPENDENT_AMBULATORY_CARE_PROVIDER_SITE_OTHER): Payer: Self-pay | Admitting: Ophthalmology

## 2024-03-21 DIAGNOSIS — I10 Essential (primary) hypertension: Secondary | ICD-10-CM | POA: Diagnosis not present

## 2024-03-21 DIAGNOSIS — H35033 Hypertensive retinopathy, bilateral: Secondary | ICD-10-CM | POA: Diagnosis not present

## 2024-03-21 DIAGNOSIS — H33103 Unspecified retinoschisis, bilateral: Secondary | ICD-10-CM | POA: Diagnosis not present

## 2024-03-21 DIAGNOSIS — Z961 Presence of intraocular lens: Secondary | ICD-10-CM

## 2024-03-21 DIAGNOSIS — H35363 Drusen (degenerative) of macula, bilateral: Secondary | ICD-10-CM | POA: Diagnosis not present

## 2024-03-21 DIAGNOSIS — H3509 Other intraretinal microvascular abnormalities: Secondary | ICD-10-CM | POA: Diagnosis not present

## 2024-03-21 DIAGNOSIS — H04123 Dry eye syndrome of bilateral lacrimal glands: Secondary | ICD-10-CM | POA: Diagnosis not present

## 2024-03-22 ENCOUNTER — Encounter (INDEPENDENT_AMBULATORY_CARE_PROVIDER_SITE_OTHER): Payer: Self-pay | Admitting: Ophthalmology

## 2024-04-20 ENCOUNTER — Ambulatory Visit: Payer: Medicare HMO

## 2024-04-20 DIAGNOSIS — I480 Paroxysmal atrial fibrillation: Secondary | ICD-10-CM | POA: Diagnosis not present

## 2024-04-21 LAB — CUP PACEART REMOTE DEVICE CHECK
Battery Remaining Longevity: 33 mo
Battery Voltage: 2.96 V
Brady Statistic AP VP Percent: 77.52 %
Brady Statistic AP VS Percent: 1.33 %
Brady Statistic AS VP Percent: 20.78 %
Brady Statistic AS VS Percent: 0.37 %
Brady Statistic RA Percent Paced: 78.6 %
Brady Statistic RV Percent Paced: 19.23 %
Date Time Interrogation Session: 20251105033624
HighPow Impedance: 77 Ohm
Implantable Lead Connection Status: 753985
Implantable Lead Connection Status: 753985
Implantable Lead Connection Status: 753985
Implantable Lead Implant Date: 20210311
Implantable Lead Implant Date: 20210311
Implantable Lead Implant Date: 20210311
Implantable Lead Location: 753858
Implantable Lead Location: 753859
Implantable Lead Location: 753860
Implantable Lead Model: 4598
Implantable Lead Model: 5076
Implantable Pulse Generator Implant Date: 20210311
Lead Channel Impedance Value: 241.412
Lead Channel Impedance Value: 249.509
Lead Channel Impedance Value: 249.509
Lead Channel Impedance Value: 265.661
Lead Channel Impedance Value: 265.661
Lead Channel Impedance Value: 399 Ohm
Lead Channel Impedance Value: 418 Ohm
Lead Channel Impedance Value: 456 Ohm
Lead Channel Impedance Value: 475 Ohm
Lead Channel Impedance Value: 513 Ohm
Lead Channel Impedance Value: 551 Ohm
Lead Channel Impedance Value: 551 Ohm
Lead Channel Impedance Value: 760 Ohm
Lead Channel Impedance Value: 836 Ohm
Lead Channel Impedance Value: 836 Ohm
Lead Channel Impedance Value: 836 Ohm
Lead Channel Impedance Value: 950 Ohm
Lead Channel Impedance Value: 950 Ohm
Lead Channel Pacing Threshold Amplitude: 0.5 V
Lead Channel Pacing Threshold Amplitude: 0.625 V
Lead Channel Pacing Threshold Amplitude: 2.25 V
Lead Channel Pacing Threshold Pulse Width: 0.4 ms
Lead Channel Pacing Threshold Pulse Width: 0.4 ms
Lead Channel Pacing Threshold Pulse Width: 0.8 ms
Lead Channel Sensing Intrinsic Amplitude: 1.25 mV
Lead Channel Sensing Intrinsic Amplitude: 1.25 mV
Lead Channel Sensing Intrinsic Amplitude: 21.25 mV
Lead Channel Sensing Intrinsic Amplitude: 21.25 mV
Lead Channel Setting Pacing Amplitude: 1.5 V
Lead Channel Setting Pacing Amplitude: 2.5 V
Lead Channel Setting Pacing Amplitude: 2.5 V
Lead Channel Setting Pacing Pulse Width: 0.4 ms
Lead Channel Setting Pacing Pulse Width: 0.8 ms
Lead Channel Setting Sensing Sensitivity: 0.3 mV
Zone Setting Status: 755011
Zone Setting Status: 755011

## 2024-04-22 NOTE — Progress Notes (Signed)
 Remote ICD Transmission

## 2024-04-26 ENCOUNTER — Ambulatory Visit: Payer: Self-pay | Admitting: Cardiology

## 2024-05-25 ENCOUNTER — Encounter: Payer: Self-pay | Admitting: Cardiology

## 2024-05-27 ENCOUNTER — Telehealth: Payer: Self-pay | Admitting: Family Medicine

## 2024-05-27 NOTE — Telephone Encounter (Signed)
 Copied from CRM #[id number]. Topic: General - Other >> May 27, 2024 10:58 AM Franky GRADE wrote: Reason for CRM: Harlene from [facility] is calling because she is following up on a chronic condition form that was sent out to the practice on 05/05/2024 and 05/23/2024 due to patient signing up for one of their chronic condition plans. She will also be able to accept a call to go over these conditions and verify if member qualifies for the plan. Member services [phone]

## 2024-05-27 NOTE — Telephone Encounter (Signed)
 Spoke with [employer] and proved the below Dx.  Chronic HFrEF (heart failure with reduced ejection fraction) (HCC)  - Primary I50.22   Coronary artery disease involving native coronary artery of native heart without angina pectoris I25.10

## 2024-06-03 ENCOUNTER — Encounter: Payer: Self-pay | Admitting: Family Medicine

## 2024-06-03 ENCOUNTER — Ambulatory Visit: Admitting: Family Medicine

## 2024-06-03 VITALS — BP 130/80 | HR 88 | Temp 97.5°F | Ht 63.5 in | Wt 168.5 lb

## 2024-06-03 DIAGNOSIS — J0100 Acute maxillary sinusitis, unspecified: Secondary | ICD-10-CM | POA: Diagnosis not present

## 2024-06-03 DIAGNOSIS — R051 Acute cough: Secondary | ICD-10-CM | POA: Insufficient documentation

## 2024-06-03 MED ORDER — AMOXICILLIN 500 MG PO CAPS: 1000.0000 mg | ORAL_CAPSULE | Freq: Two times a day (BID) | ORAL | 0 refills | Status: AC

## 2024-06-03 NOTE — Progress Notes (Signed)
 "   Patient ID: [name], female    DOB: 09-18-1950, 73 y.o.   MRN: [id number]  This visit was conducted in person.  BP 130/80   Pulse 88   Temp (!) 97.5 F (36.4 C) (Temporal)   Ht 5' 3.5 (1.613 m)   Wt 168 lb 8 oz (76.4 kg)   SpO2 96%   BMI 29.38 kg/m    CC:  Chief Complaint  Patient presents with   Nasal Congestion    Started on Tuesday   Cough   Diarrhea         Subjective:   HPI: [name] is a 73 y.o. female presenting on 06/03/2024 for Nasal Congestion (Started on Tuesday), Cough, and Diarrhea (/)  Has had cold for couple weeks.. started as runny nose then sinus congestion, headache. cough Now  3 days ago Symptoms progressed to acute diarrhea ( multiple times a day 2 days ago) had stool incontinence  Productive cough.. white mucus  No fever.   Some SOB when coughing, feeling tired. Left face pain and left ear pain.   Sick contacts:  great grandbaby with diarrhea COVID testing:   none     She has tried to treat with  mucinex, immodium     No history of chronic lung disease such as asthma or COPD. Non-smoker.       Relevant past medical, surgical, family and social history reviewed and updated as indicated. Interim medical history since our last visit reviewed. Allergies and medications reviewed and updated. Outpatient Medications Prior to Visit  Medication Sig Dispense Refill   acetaminophen  (TYLENOL ) 500 MG tablet Take 1,000 mg by mouth 2 (two) times daily as needed (pain).     aspirin  81 MG chewable tablet Chew 1 tablet (81 mg total) by mouth daily. 30 tablet 0   carvedilol  (COREG ) 3.125 MG tablet TAKE 1 TABLET BY MOUTH TWICE A DAY WITH FOOD 180 tablet 2   Cholecalciferol  (VITAMIN D3 PO) Take 1,000 Units by mouth in the morning and at bedtime.     diphenhydrAMINE  (BENADRYL  CHILDRENS ALLERGY) 12.5 MG/5ML liquid Take 25 mg by mouth at bedtime.     ELIQUIS  5 MG TABS tablet TAKE 1 TABLET BY MOUTH TWICE A DAY 180 tablet 1   ezetimibe  (ZETIA )  10 MG tablet TAKE 1 TABLET BY MOUTH EVERY DAY 90 tablet 3   famotidine  (PEPCID ) 20 MG tablet TAKE 1 TABLET BY MOUTH TWICE A DAY 180 tablet 1   methocarbamol  (ROBAXIN ) 500 MG tablet Take 1 tablet (500 mg total) by mouth 2 (two) times daily as needed for muscle spasms (sedation precautions). 30 tablet 0   rosuvastatin  (CRESTOR ) 10 MG tablet Take 1 tablet (10 mg total) by mouth daily at 6 PM. 90 tablet 1   sertraline  (ZOLOFT ) 50 MG tablet TAKE 2 TABLETS BY MOUTH AT BEDTIME. 180 tablet 1   sodium bicarbonate 325 MG tablet Take 325 mg by mouth 2 (two) times daily.     torsemide  (DEMADEX ) 20 MG tablet Take 1 tablet (20 mg total) by mouth daily. 90 tablet 3   vitamin B-12 (CYANOCOBALAMIN ) 1000 MCG tablet Take 1,000 mcg by mouth daily.     No facility-administered medications prior to visit.     Per HPI unless specifically indicated in ROS section below Review of Systems  HENT:  Positive for congestion, ear pain, postnasal drip, sinus pressure, sinus pain and sore throat.   Skin:        Sore under nose  Objective:  BP 130/80   Pulse 88   Temp (!) 97.5 F (36.4 C) (Temporal)   Ht 5' 3.5 (1.613 m)   Wt 168 lb 8 oz (76.4 kg)   SpO2 96%   BMI 29.38 kg/m   Wt Readings from Last 3 Encounters:  06/03/24 168 lb 8 oz (76.4 kg)  03/11/24 166 lb 4 oz (75.4 kg)  02/19/24 162 lb (73.5 kg)      Physical Exam Constitutional:      General: She is not in acute distress.    Appearance: Normal appearance. She is well-developed. She is not ill-appearing or toxic-appearing.  HENT:     Head: Normocephalic.     Right Ear: Hearing, tympanic membrane, ear canal and external ear normal. Tympanic membrane is not erythematous, retracted or bulging.     Left Ear: Hearing, tympanic membrane, ear canal and external ear normal. Tympanic membrane is not erythematous, retracted or bulging.     Nose: Mucosal edema and rhinorrhea present.     Right Sinus: No maxillary sinus tenderness or frontal sinus tenderness.      Left Sinus: Maxillary sinus tenderness present. No frontal sinus tenderness.     Mouth/Throat:     Pharynx: Uvula midline.  Eyes:     General: Lids are normal. Lids are everted, no foreign bodies appreciated.     Conjunctiva/sclera: Conjunctivae normal.     Pupils: Pupils are equal, round, and reactive to light.  Neck:     Thyroid : No thyroid  mass or thyromegaly.     Vascular: No carotid bruit.     Trachea: Trachea normal.  Cardiovascular:     Rate and Rhythm: Normal rate and regular rhythm.     Pulses: Normal pulses.     Heart sounds: Normal heart sounds, S1 normal and S2 normal. No murmur heard.    No friction rub. No gallop.  Pulmonary:     Effort: Pulmonary effort is normal. No tachypnea or respiratory distress.     Breath sounds: Normal breath sounds. No decreased breath sounds, wheezing, rhonchi or rales.  Abdominal:     General: Bowel sounds are normal.     Palpations: Abdomen is soft.     Tenderness: There is no abdominal tenderness.  Musculoskeletal:     Cervical back: Normal range of motion and neck supple.  Skin:    General: Skin is warm and dry.     Findings: Rash present.     Comments: Small 1 cm erythematous patch under left nares with erythema mildly  Neurological:     Mental Status: She is alert.  Psychiatric:        Mood and Affect: Mood is not anxious or depressed.        Speech: Speech normal.        Behavior: Behavior normal. Behavior is cooperative.        Thought Content: Thought content normal.        Judgment: Judgment normal.       Results for orders placed or performed in visit on 04/20/24  CUP PACEART REMOTE DEVICE CHECK   Collection Time: 04/20/24  3:36 AM  Result Value Ref Range   Date Time Interrogation Session [id number]    Pulse Generator Manufacturer MERM    Pulse Gen Model [id number] Claria MRI Quad CRT-D    Pulse Gen Serial Number [id number]    Clinic Name [facility]    Implantable Pulse Generator Type Cardiac Resynch  Therapy Defibulator    Implantable Pulse Generator  Implant Date 79789688    Implantable Lead Manufacturer MERM    Implantable Lead Model 4598 Virl Cover S MRI SureScan    Implantable Lead Serial Number [id number]    Implantable Lead Implant Date 79789688    Implantable Lead Location Detail 1 UNKNOWN    Implantable Lead Location [id number]    Implantable Lead Connection Status [id number]    Implantable Lead Manufacturer MERM    Implantable Lead Model 5076 CapSureFix Novus MRI SureScan    Implantable Lead Serial Number [id number]    Implantable Lead Implant Date 79789688    Implantable Lead Location Detail 1 APPENDAGE    Implantable Lead Location [id number]    Implantable Lead Connection Status [id number]    Implantable Lead Manufacturer [facility]    Implantable Lead Model 502 051 5547 Sprint Quattro Secure S MRI SureScan    Implantable Lead Serial Number [id number]    Implantable Lead Implant Date 79789688    Implantable Lead Location Detail 1 SEPTUM    Implantable Lead Location [id number]    Implantable Lead Connection Status [id number]    Lead Channel Setting Sensing Sensitivity 0.3 mV   Lead Channel Setting Pacing Amplitude 1.5 V   Lead Channel Setting Pacing Pulse Width 0.4 ms   Lead Channel Setting Pacing Amplitude 2.5 V   Lead Channel Setting Pacing Pulse Width 0.8 ms   Lead Channel Setting Pacing Amplitude 2.5 V   Lead Channel Setting Pacing Capture Mode Monitor Capture    Zone Setting Status Active    Zone Setting Status Inactive    Zone Setting Status Inactive    Zone Setting Status 755011    Zone Setting Status [phone]    Lead Channel Impedance Value 418 ohm   Lead Channel Sensing Intrinsic Amplitude 1.25 mV   Lead Channel Sensing Intrinsic Amplitude 1.25 mV   Lead Channel Pacing Threshold Amplitude 0.5 V   Lead Channel Pacing Threshold Pulse Width 0.4 ms   Lead Channel Impedance Value 475 ohm   Lead Channel Impedance Value 399 ohm   Lead Channel Sensing Intrinsic Amplitude 21.25 mV   Lead  Channel Sensing Intrinsic Amplitude 21.25 mV   Lead Channel Pacing Threshold Amplitude 0.625 V   Lead Channel Pacing Threshold Pulse Width 0.4 ms   HighPow Impedance 77 ohm   Lead Channel Impedance Value 836 ohm   Lead Channel Impedance Value 836 ohm   Lead Channel Impedance Value 836 ohm   Lead Channel Impedance Value 760 ohm   Lead Channel Impedance Value 950 ohm   Lead Channel Impedance Value 950 ohm   Lead Channel Impedance Value 456 ohm   Lead Channel Impedance Value 551 ohm   Lead Channel Impedance Value 551 ohm   Lead Channel Impedance Value 513 ohm   Lead Channel Impedance Value 249.509    Lead Channel Impedance Value 249.509    Lead Channel Impedance Value 241.412    Lead Channel Impedance Value 265.661    Lead Channel Impedance Value 265.661    Lead Channel Pacing Threshold Amplitude 2.25 V   Lead Channel Pacing Threshold Pulse Width 0.8 ms   Battery Status OK    Battery Remaining Longevity 33 mo   Battery Voltage 2.96 V   Brady Statistic RA Percent Paced 78.6 %   Brady Statistic RV Percent Paced 19.23 %   Brady Statistic AP VP Percent 77.52 %   Brady Statistic AS VP Percent 20.78 %   Brady Statistic AP VS Percent 1.33 %   Brady Statistic AS VS Percent 0.37 %  Assessment and Plan  Acute non-recurrent maxillary sinusitis Assessment & Plan: Acute, likely initial viral infection now with possible bacterial superinfection in the left sinus. Will treat with amoxicillin  500 mg 2 tablets twice daily x 10 days. She can apply topical Neosporin several times a day to the area of skin irritation under left nose that could be the beginning of impetigo. Recommend nasal saline and Mucinex for cough as needed.  Return ER precautions provided.   Other orders -     Amoxicillin ; Take 2 capsules (1,000 mg total) by mouth 2 (two) times daily.  Dispense: 40 capsule; Refill: 0    No follow-ups on file.   Greig Ring, MD  "

## 2024-06-03 NOTE — Assessment & Plan Note (Signed)
 Acute, likely initial viral infection now with possible bacterial superinfection in the left sinus. Will treat with amoxicillin  500 mg 2 tablets twice daily x 10 days. She can apply topical Neosporin several times a day to the area of skin irritation under left nose that could be the beginning of impetigo. Recommend nasal saline and Mucinex for cough as needed.  Return ER precautions provided.

## 2024-06-10 ENCOUNTER — Other Ambulatory Visit: Payer: Self-pay | Admitting: Family Medicine

## 2024-06-10 ENCOUNTER — Other Ambulatory Visit: Payer: Self-pay | Admitting: Cardiology

## 2024-06-23 ENCOUNTER — Encounter: Payer: Self-pay | Admitting: Cardiology

## 2024-06-23 ENCOUNTER — Other Ambulatory Visit: Payer: Self-pay | Admitting: Family Medicine

## 2024-07-04 ENCOUNTER — Telehealth: Payer: Self-pay | Admitting: Family Medicine

## 2024-07-04 NOTE — Telephone Encounter (Signed)
 Type of form received: [facility] place[facility]     Additional comments:   Received ab:[email or web address]   Form should be Faxed to:   Form should be mailed to:     Is patient requesting call for pickup: Y      Form placed:  in dr Laveda in [place] at front desk   Attach charge sheet.N    Individual made aware of 3-5 business day turn around (Y/N)?Y

## 2024-07-05 NOTE — Telephone Encounter (Signed)
Handicap Placard Application placed in Dr. Daphine Deutscher office in box to complete.

## 2024-07-08 NOTE — Telephone Encounter (Signed)
 [name] (daughter) notified handicap placard application is ready to be picked up at the front desk.

## 2024-07-08 NOTE — Telephone Encounter (Signed)
 Patient picked up ppwk today

## 2024-07-20 ENCOUNTER — Ambulatory Visit

## 2024-07-20 LAB — CUP PACEART REMOTE DEVICE CHECK
Battery Remaining Longevity: 37 mo
Battery Voltage: 2.96 V
Brady Statistic AP VP Percent: 66.46 %
Brady Statistic AP VS Percent: 1.13 %
Brady Statistic AS VP Percent: 30.95 %
Brady Statistic AS VS Percent: 1.46 %
Brady Statistic RA Percent Paced: 67.15 %
Brady Statistic RV Percent Paced: 3.43 %
Date Time Interrogation Session: 20260204033323
HighPow Impedance: 85 Ohm
Implantable Lead Connection Status: 753985
Implantable Lead Connection Status: 753985
Implantable Lead Connection Status: 753985
Implantable Lead Implant Date: 20210311
Implantable Lead Implant Date: 20210311
Implantable Lead Implant Date: 20210311
Implantable Lead Location: 753858
Implantable Lead Location: 753859
Implantable Lead Location: 753860
Implantable Lead Model: 4598
Implantable Lead Model: 5076
Implantable Pulse Generator Implant Date: 20210311
Lead Channel Impedance Value: 1026 Ohm
Lead Channel Impedance Value: 1064 Ohm
Lead Channel Impedance Value: 257.018
Lead Channel Impedance Value: 257.018
Lead Channel Impedance Value: 260.571
Lead Channel Impedance Value: 294.5 Ohm
Lead Channel Impedance Value: 299.175
Lead Channel Impedance Value: 342 Ohm
Lead Channel Impedance Value: 418 Ohm
Lead Channel Impedance Value: 456 Ohm
Lead Channel Impedance Value: 456 Ohm
Lead Channel Impedance Value: 589 Ohm
Lead Channel Impedance Value: 589 Ohm
Lead Channel Impedance Value: 608 Ohm
Lead Channel Impedance Value: 874 Ohm
Lead Channel Impedance Value: 893 Ohm
Lead Channel Impedance Value: 931 Ohm
Lead Channel Impedance Value: 931 Ohm
Lead Channel Pacing Threshold Amplitude: 0.5 V
Lead Channel Pacing Threshold Amplitude: 0.625 V
Lead Channel Pacing Threshold Amplitude: 2.125 V
Lead Channel Pacing Threshold Pulse Width: 0.4 ms
Lead Channel Pacing Threshold Pulse Width: 0.4 ms
Lead Channel Pacing Threshold Pulse Width: 0.8 ms
Lead Channel Sensing Intrinsic Amplitude: 1.5 mV
Lead Channel Sensing Intrinsic Amplitude: 1.5 mV
Lead Channel Sensing Intrinsic Amplitude: 20.875 mV
Lead Channel Sensing Intrinsic Amplitude: 20.875 mV
Lead Channel Setting Pacing Amplitude: 1.5 V
Lead Channel Setting Pacing Amplitude: 2.5 V
Lead Channel Setting Pacing Amplitude: 2.5 V
Lead Channel Setting Pacing Pulse Width: 0.4 ms
Lead Channel Setting Pacing Pulse Width: 0.8 ms
Lead Channel Setting Sensing Sensitivity: 0.3 mV
Zone Setting Status: 755011
Zone Setting Status: 755011

## 2024-09-12 ENCOUNTER — Ambulatory Visit: Admitting: Cardiology

## 2024-09-19 ENCOUNTER — Encounter (INDEPENDENT_AMBULATORY_CARE_PROVIDER_SITE_OTHER): Admitting: Ophthalmology

## 2024-10-19 ENCOUNTER — Ambulatory Visit

## 2025-01-18 ENCOUNTER — Ambulatory Visit

## 2025-02-22 ENCOUNTER — Ambulatory Visit

## 2025-04-19 ENCOUNTER — Ambulatory Visit

## 2025-07-19 ENCOUNTER — Ambulatory Visit
